# Patient Record
Sex: Female | Born: 1935
Health system: Southern US, Community
[De-identification: ages and names within clinical notes are randomized; demographics above are authoritative.]

## PROBLEM LIST (undated history)

## (undated) DIAGNOSIS — I2510 Atherosclerotic heart disease of native coronary artery without angina pectoris: Secondary | ICD-10-CM

## (undated) DIAGNOSIS — K449 Diaphragmatic hernia without obstruction or gangrene: Secondary | ICD-10-CM

## (undated) DIAGNOSIS — C801 Malignant (primary) neoplasm, unspecified: Secondary | ICD-10-CM

## (undated) DIAGNOSIS — M1712 Unilateral primary osteoarthritis, left knee: Secondary | ICD-10-CM

## (undated) DIAGNOSIS — R42 Dizziness and giddiness: Secondary | ICD-10-CM

## (undated) DIAGNOSIS — H9190 Unspecified hearing loss, unspecified ear: Secondary | ICD-10-CM

## (undated) DIAGNOSIS — R011 Cardiac murmur, unspecified: Secondary | ICD-10-CM

## (undated) DIAGNOSIS — I10 Essential (primary) hypertension: Secondary | ICD-10-CM

## (undated) DIAGNOSIS — Z9289 Personal history of other medical treatment: Secondary | ICD-10-CM

## (undated) DIAGNOSIS — E785 Hyperlipidemia, unspecified: Secondary | ICD-10-CM

## (undated) DIAGNOSIS — Z9889 Other specified postprocedural states: Secondary | ICD-10-CM

## (undated) DIAGNOSIS — T148XXA Other injury of unspecified body region, initial encounter: Secondary | ICD-10-CM

## (undated) DIAGNOSIS — I219 Acute myocardial infarction, unspecified: Secondary | ICD-10-CM

## (undated) DIAGNOSIS — B029 Zoster without complications: Secondary | ICD-10-CM

## (undated) DIAGNOSIS — M1990 Unspecified osteoarthritis, unspecified site: Secondary | ICD-10-CM

## (undated) DIAGNOSIS — R112 Nausea with vomiting, unspecified: Secondary | ICD-10-CM

## (undated) DIAGNOSIS — M109 Gout, unspecified: Secondary | ICD-10-CM

## (undated) DIAGNOSIS — M810 Age-related osteoporosis without current pathological fracture: Secondary | ICD-10-CM

## (undated) HISTORY — DX: Unspecified osteoarthritis, unspecified site: M19.90

## (undated) HISTORY — DX: Unilateral primary osteoarthritis, left knee: M17.12

## (undated) HISTORY — DX: Essential (primary) hypertension: I10

## (undated) HISTORY — DX: Diaphragmatic hernia without obstruction or gangrene: K44.9

## (undated) HISTORY — DX: Age-related osteoporosis without current pathological fracture: M81.0

## (undated) HISTORY — DX: Gout, unspecified: M10.9

## (undated) HISTORY — DX: Malignant (primary) neoplasm, unspecified: C80.1

## (undated) HISTORY — DX: Hyperlipidemia, unspecified: E78.5

## (undated) HISTORY — DX: Other injury of unspecified body region, initial encounter: T14.8XXA

## (undated) HISTORY — PX: TOTAL HIP ARTHROPLASTY: SHX124

## (undated) HISTORY — PX: SKIN GRAFT: SHX250

## (undated) HISTORY — DX: Zoster without complications: B02.9

## (undated) HISTORY — DX: Atherosclerotic heart disease of native coronary artery without angina pectoris: I25.10

---

## 1991-05-05 HISTORY — PX: CORONARY ANGIOPLASTY: SHX604

## 1992-05-04 HISTORY — PX: CORONARY ARTERY BYPASS GRAFT: SHX141

## 1996-05-04 HISTORY — PX: JOINT REPLACEMENT: SHX530

## 1999-06-17 ENCOUNTER — Other Ambulatory Visit: Admission: RE | Admit: 1999-06-17 | Discharge: 1999-06-17 | Payer: Self-pay | Admitting: Obstetrics and Gynecology

## 2001-03-09 ENCOUNTER — Encounter: Payer: Self-pay | Admitting: Cardiovascular Disease

## 2001-03-09 HISTORY — PX: CORONARY ANGIOPLASTY WITH STENT PLACEMENT: SHX49

## 2001-03-10 ENCOUNTER — Inpatient Hospital Stay (HOSPITAL_COMMUNITY): Admission: RE | Admit: 2001-03-10 | Discharge: 2001-03-21 | Payer: Self-pay | Admitting: Cardiovascular Disease

## 2002-07-27 ENCOUNTER — Other Ambulatory Visit: Admission: RE | Admit: 2002-07-27 | Discharge: 2002-07-27 | Payer: Self-pay | Admitting: Obstetrics and Gynecology

## 2003-05-10 ENCOUNTER — Encounter: Payer: Self-pay | Admitting: Internal Medicine

## 2003-12-25 ENCOUNTER — Other Ambulatory Visit: Admission: RE | Admit: 2003-12-25 | Discharge: 2003-12-25 | Payer: Self-pay | Admitting: Obstetrics and Gynecology

## 2004-03-11 ENCOUNTER — Ambulatory Visit: Payer: Self-pay | Admitting: Internal Medicine

## 2004-09-10 ENCOUNTER — Ambulatory Visit: Payer: Self-pay | Admitting: Internal Medicine

## 2004-09-22 ENCOUNTER — Ambulatory Visit: Payer: Self-pay | Admitting: Internal Medicine

## 2004-09-23 ENCOUNTER — Ambulatory Visit: Payer: Self-pay | Admitting: Internal Medicine

## 2004-09-25 ENCOUNTER — Ambulatory Visit (HOSPITAL_COMMUNITY): Admission: RE | Admit: 2004-09-25 | Discharge: 2004-09-25 | Payer: Self-pay | Admitting: Internal Medicine

## 2004-10-07 ENCOUNTER — Ambulatory Visit: Payer: Self-pay | Admitting: Internal Medicine

## 2004-10-23 ENCOUNTER — Ambulatory Visit: Payer: Self-pay | Admitting: Internal Medicine

## 2004-12-18 ENCOUNTER — Ambulatory Visit: Payer: Self-pay | Admitting: Internal Medicine

## 2005-02-03 ENCOUNTER — Ambulatory Visit: Payer: Self-pay | Admitting: Internal Medicine

## 2005-02-06 ENCOUNTER — Ambulatory Visit: Payer: Self-pay | Admitting: Internal Medicine

## 2005-03-09 ENCOUNTER — Ambulatory Visit: Payer: Self-pay | Admitting: Internal Medicine

## 2005-03-19 ENCOUNTER — Ambulatory Visit: Payer: Self-pay | Admitting: Internal Medicine

## 2005-09-10 ENCOUNTER — Ambulatory Visit: Payer: Self-pay | Admitting: Internal Medicine

## 2005-09-25 ENCOUNTER — Ambulatory Visit: Payer: Self-pay | Admitting: Internal Medicine

## 2005-10-13 ENCOUNTER — Ambulatory Visit: Payer: Self-pay | Admitting: Internal Medicine

## 2005-10-30 ENCOUNTER — Other Ambulatory Visit: Admission: RE | Admit: 2005-10-30 | Discharge: 2005-10-30 | Payer: Self-pay | Admitting: Obstetrics and Gynecology

## 2006-02-12 ENCOUNTER — Ambulatory Visit: Payer: Self-pay | Admitting: Internal Medicine

## 2006-03-10 ENCOUNTER — Ambulatory Visit: Payer: Self-pay | Admitting: Internal Medicine

## 2006-05-30 ENCOUNTER — Emergency Department (HOSPITAL_COMMUNITY): Admission: EM | Admit: 2006-05-30 | Discharge: 2006-05-30 | Payer: Self-pay | Admitting: Emergency Medicine

## 2006-05-31 ENCOUNTER — Ambulatory Visit: Payer: Self-pay | Admitting: Internal Medicine

## 2006-05-31 ENCOUNTER — Encounter: Admission: RE | Admit: 2006-05-31 | Discharge: 2006-05-31 | Payer: Self-pay | Admitting: Internal Medicine

## 2006-05-31 LAB — CONVERTED CEMR LAB
Basophils Absolute: 0 10*3/uL (ref 0.0–0.1)
Basophils Relative: 0.2 % (ref 0.0–1.0)
Eosinophils Absolute: 0.1 10*3/uL (ref 0.0–0.6)
Eosinophils Relative: 0.7 % (ref 0.0–5.0)
HCT: 37.6 % (ref 36.0–46.0)
Hemoglobin: 12.9 g/dL (ref 12.0–15.0)
Lymphocytes Relative: 15.9 % (ref 12.0–46.0)
MCHC: 34.3 g/dL (ref 30.0–36.0)
MCV: 84.2 fL (ref 78.0–100.0)
Monocytes Absolute: 0.9 10*3/uL — ABNORMAL HIGH (ref 0.2–0.7)
Monocytes Relative: 7.4 % (ref 3.0–11.0)
Neutro Abs: 8.7 10*3/uL — ABNORMAL HIGH (ref 1.4–7.7)
Neutrophils Relative %: 75.8 % (ref 43.0–77.0)
Platelets: 272 10*3/uL (ref 150–400)
RBC: 4.47 M/uL (ref 3.87–5.11)
RDW: 14.4 % (ref 11.5–14.6)
Rheumatoid fact SerPl-aCnc: <20 intl units/mL — ABNORMAL LOW (ref 0.0–20.0)
Sed Rate: 82 mm/hr — ABNORMAL HIGH (ref 0–25)
WBC: 11.5 10*3/uL — ABNORMAL HIGH (ref 4.5–10.5)

## 2006-06-21 ENCOUNTER — Ambulatory Visit: Payer: Self-pay | Admitting: Internal Medicine

## 2006-06-21 LAB — CONVERTED CEMR LAB
Hgb A1c MFr Bld: 6.7 % — ABNORMAL HIGH (ref 4.6–6.0)
Sed Rate: 16 mm/hr (ref 0–25)

## 2006-08-02 ENCOUNTER — Ambulatory Visit: Payer: Self-pay | Admitting: Internal Medicine

## 2006-08-02 LAB — CONVERTED CEMR LAB
BUN: 20 mg/dL (ref 6–23)
Creatinine, Ser: 1 mg/dL (ref 0.4–1.2)
Hgb A1c MFr Bld: 6.6 % — ABNORMAL HIGH (ref 4.6–6.0)
Potassium: 3.3 meq/L — ABNORMAL LOW (ref 3.5–5.1)

## 2006-08-13 ENCOUNTER — Ambulatory Visit: Payer: Self-pay | Admitting: Internal Medicine

## 2006-09-08 DIAGNOSIS — Z951 Presence of aortocoronary bypass graft: Secondary | ICD-10-CM | POA: Insufficient documentation

## 2006-09-08 DIAGNOSIS — R748 Abnormal levels of other serum enzymes: Secondary | ICD-10-CM | POA: Insufficient documentation

## 2006-09-08 DIAGNOSIS — E785 Hyperlipidemia, unspecified: Secondary | ICD-10-CM | POA: Insufficient documentation

## 2006-09-08 DIAGNOSIS — M353 Polymyalgia rheumatica: Secondary | ICD-10-CM | POA: Insufficient documentation

## 2006-09-08 DIAGNOSIS — Z9889 Other specified postprocedural states: Secondary | ICD-10-CM

## 2006-09-09 ENCOUNTER — Ambulatory Visit: Payer: Self-pay | Admitting: Internal Medicine

## 2006-09-09 ENCOUNTER — Encounter: Payer: Self-pay | Admitting: Internal Medicine

## 2006-11-30 ENCOUNTER — Ambulatory Visit: Payer: Self-pay | Admitting: Internal Medicine

## 2006-11-30 LAB — CONVERTED CEMR LAB
BUN: 21 mg/dL (ref 6–23)
Creatinine, Ser: 1.2 mg/dL (ref 0.4–1.2)
Creatinine,U: 139.4 mg/dL
Hgb A1c MFr Bld: 6.4 % — ABNORMAL HIGH (ref 4.6–6.0)
Microalb Creat Ratio: 11.5 mg/g (ref 0.0–30.0)
Microalb, Ur: 1.6 mg/dL (ref 0.0–1.9)
Potassium: 3.6 meq/L (ref 3.5–5.1)

## 2006-12-07 ENCOUNTER — Ambulatory Visit: Payer: Self-pay | Admitting: Internal Medicine

## 2007-01-04 ENCOUNTER — Telehealth (INDEPENDENT_AMBULATORY_CARE_PROVIDER_SITE_OTHER): Payer: Self-pay | Admitting: *Deleted

## 2007-01-06 ENCOUNTER — Ambulatory Visit: Payer: Self-pay | Admitting: Internal Medicine

## 2007-01-06 DIAGNOSIS — T887XXA Unspecified adverse effect of drug or medicament, initial encounter: Secondary | ICD-10-CM | POA: Insufficient documentation

## 2007-01-12 ENCOUNTER — Encounter: Payer: Self-pay | Admitting: Internal Medicine

## 2007-02-22 ENCOUNTER — Telehealth: Payer: Self-pay | Admitting: Internal Medicine

## 2007-02-23 ENCOUNTER — Encounter: Payer: Self-pay | Admitting: Internal Medicine

## 2007-03-08 ENCOUNTER — Ambulatory Visit: Payer: Self-pay | Admitting: Internal Medicine

## 2007-03-11 ENCOUNTER — Encounter: Payer: Self-pay | Admitting: Internal Medicine

## 2007-03-18 ENCOUNTER — Encounter: Payer: Self-pay | Admitting: Internal Medicine

## 2007-05-11 ENCOUNTER — Ambulatory Visit: Payer: Self-pay | Admitting: Internal Medicine

## 2007-05-21 LAB — CONVERTED CEMR LAB: Hgb A1c MFr Bld: 6.1 % — ABNORMAL HIGH (ref 4.6–6.0)

## 2007-05-23 ENCOUNTER — Encounter (INDEPENDENT_AMBULATORY_CARE_PROVIDER_SITE_OTHER): Payer: Self-pay | Admitting: *Deleted

## 2007-09-29 ENCOUNTER — Ambulatory Visit: Payer: Self-pay | Admitting: Internal Medicine

## 2007-09-29 LAB — CONVERTED CEMR LAB
Cholesterol, target level: 200 mg/dL
HDL goal, serum: 40 mg/dL
LDL Goal: 70 mg/dL

## 2007-10-08 ENCOUNTER — Encounter: Payer: Self-pay | Admitting: Internal Medicine

## 2007-11-15 ENCOUNTER — Ambulatory Visit: Payer: Self-pay | Admitting: Internal Medicine

## 2007-11-17 ENCOUNTER — Encounter: Payer: Self-pay | Admitting: Internal Medicine

## 2007-11-24 ENCOUNTER — Encounter: Payer: Self-pay | Admitting: Internal Medicine

## 2007-11-28 LAB — CONVERTED CEMR LAB: Hgb A1c MFr Bld: 6.2 % — ABNORMAL HIGH (ref 4.6–6.0)

## 2007-12-13 ENCOUNTER — Ambulatory Visit: Payer: Self-pay | Admitting: Internal Medicine

## 2007-12-16 ENCOUNTER — Encounter: Payer: Self-pay | Admitting: Internal Medicine

## 2007-12-18 LAB — CONVERTED CEMR LAB
Basophils Relative: 0.2 % (ref 0.0–3.0)
Eosinophils Absolute: 0.1 10*3/uL (ref 0.0–0.7)
Eosinophils Relative: 2 % (ref 0.0–5.0)
HCT: 33.7 % — ABNORMAL LOW (ref 36.0–46.0)
Hemoglobin: 11.3 g/dL — ABNORMAL LOW (ref 12.0–15.0)
MCV: 88.7 fL (ref 78.0–100.0)
Neutro Abs: 5 10*3/uL (ref 1.4–7.7)
Neutrophils Relative %: 67.7 % (ref 43.0–77.0)
RBC: 3.8 M/uL — ABNORMAL LOW (ref 3.87–5.11)
WBC: 7.3 10*3/uL (ref 4.5–10.5)

## 2007-12-19 ENCOUNTER — Encounter (INDEPENDENT_AMBULATORY_CARE_PROVIDER_SITE_OTHER): Payer: Self-pay | Admitting: *Deleted

## 2007-12-21 ENCOUNTER — Ambulatory Visit: Payer: Self-pay | Admitting: Internal Medicine

## 2007-12-21 DIAGNOSIS — R195 Other fecal abnormalities: Secondary | ICD-10-CM

## 2007-12-21 DIAGNOSIS — E119 Type 2 diabetes mellitus without complications: Secondary | ICD-10-CM | POA: Insufficient documentation

## 2007-12-21 DIAGNOSIS — D649 Anemia, unspecified: Secondary | ICD-10-CM | POA: Insufficient documentation

## 2007-12-21 LAB — CONVERTED CEMR LAB
Albumin, U: DETECTED %
Alpha 1, Urine: DETECTED % — AB
Gamma Globulin, Urine: DETECTED % — AB
Volume, Urine: 2800 mL

## 2008-01-05 ENCOUNTER — Ambulatory Visit: Payer: Self-pay | Admitting: Internal Medicine

## 2008-01-05 DIAGNOSIS — R809 Proteinuria, unspecified: Secondary | ICD-10-CM

## 2008-01-06 LAB — CONVERTED CEMR LAB
Basophils Relative: 1.4 % (ref 0.0–3.0)
Eosinophils Absolute: 0.2 10*3/uL (ref 0.0–0.7)
Eosinophils Relative: 1.7 % (ref 0.0–5.0)
HCT: 34.5 % — ABNORMAL LOW (ref 36.0–46.0)
MCV: 87 fL (ref 78.0–100.0)
Monocytes Absolute: 0.8 10*3/uL (ref 0.1–1.0)
RBC: 3.96 M/uL (ref 3.87–5.11)
WBC: 10.1 10*3/uL (ref 4.5–10.5)

## 2008-01-12 ENCOUNTER — Ambulatory Visit: Payer: Self-pay | Admitting: Internal Medicine

## 2008-01-12 ENCOUNTER — Encounter: Payer: Self-pay | Admitting: Internal Medicine

## 2008-01-12 LAB — HM COLONOSCOPY

## 2008-01-24 LAB — CONVERTED CEMR LAB: Folate: >20 ng/mL

## 2008-02-15 ENCOUNTER — Encounter (INDEPENDENT_AMBULATORY_CARE_PROVIDER_SITE_OTHER): Payer: Self-pay | Admitting: *Deleted

## 2008-03-14 ENCOUNTER — Encounter: Payer: Self-pay | Admitting: Internal Medicine

## 2008-03-28 ENCOUNTER — Encounter: Payer: Self-pay | Admitting: Internal Medicine

## 2008-04-12 ENCOUNTER — Telehealth: Payer: Self-pay | Admitting: Internal Medicine

## 2008-07-05 ENCOUNTER — Ambulatory Visit: Payer: Self-pay | Admitting: Internal Medicine

## 2008-07-16 ENCOUNTER — Encounter (INDEPENDENT_AMBULATORY_CARE_PROVIDER_SITE_OTHER): Payer: Self-pay | Admitting: *Deleted

## 2008-07-16 ENCOUNTER — Ambulatory Visit: Payer: Self-pay | Admitting: Internal Medicine

## 2008-07-16 LAB — CONVERTED CEMR LAB
ALT: 13 units/L (ref 0–35)
Cholesterol: 146 mg/dL (ref 0–200)
HDL: 55.1 mg/dL (ref >39.0)
Total Protein: 7.6 g/dL (ref 6.0–8.3)
VLDL: 15 mg/dL (ref 0–40)

## 2008-09-17 ENCOUNTER — Encounter: Payer: Self-pay | Admitting: Internal Medicine

## 2009-02-15 ENCOUNTER — Ambulatory Visit: Payer: Self-pay | Admitting: Internal Medicine

## 2009-03-16 ENCOUNTER — Emergency Department (HOSPITAL_COMMUNITY): Admission: EM | Admit: 2009-03-16 | Discharge: 2009-03-16 | Payer: Self-pay | Admitting: Emergency Medicine

## 2009-03-19 ENCOUNTER — Ambulatory Visit: Payer: Self-pay | Admitting: Internal Medicine

## 2009-03-20 LAB — CONVERTED CEMR LAB
Hgb A1c MFr Bld: 6.1 % (ref 4.6–6.5)
Microalb, Ur: 0.2 mg/dL (ref 0.0–1.9)

## 2009-04-11 ENCOUNTER — Encounter: Payer: Self-pay | Admitting: Internal Medicine

## 2009-05-07 ENCOUNTER — Telehealth (INDEPENDENT_AMBULATORY_CARE_PROVIDER_SITE_OTHER): Payer: Self-pay | Admitting: *Deleted

## 2009-07-30 ENCOUNTER — Encounter: Payer: Self-pay | Admitting: Internal Medicine

## 2009-12-31 ENCOUNTER — Ambulatory Visit: Payer: Self-pay | Admitting: Internal Medicine

## 2009-12-31 DIAGNOSIS — M810 Age-related osteoporosis without current pathological fracture: Secondary | ICD-10-CM

## 2009-12-31 DIAGNOSIS — I10 Essential (primary) hypertension: Secondary | ICD-10-CM | POA: Insufficient documentation

## 2009-12-31 DIAGNOSIS — Z85828 Personal history of other malignant neoplasm of skin: Secondary | ICD-10-CM | POA: Insufficient documentation

## 2010-01-07 ENCOUNTER — Ambulatory Visit: Payer: Self-pay | Admitting: Internal Medicine

## 2010-01-07 LAB — CONVERTED CEMR LAB
ALT: 15 units/L (ref 0–35)
AST: 20 units/L (ref 0–37)
Alkaline Phosphatase: 93 units/L (ref 39–117)
Basophils Relative: 0.9 % (ref 0.0–3.0)
Bilirubin, Direct: 0.2 mg/dL (ref 0.0–0.3)
Chloride: 106 meq/L (ref 96–112)
Creatinine, Ser: 1.2 mg/dL (ref 0.4–1.2)
Eosinophils Relative: 1.6 % (ref 0.0–5.0)
LDL Cholesterol: 86 mg/dL (ref 0–99)
MCV: 90.3 fL (ref 78.0–100.0)
Monocytes Absolute: 0.6 10*3/uL (ref 0.1–1.0)
Monocytes Relative: 7.9 % (ref 3.0–12.0)
Neutrophils Relative %: 68 % (ref 43.0–77.0)
Potassium: 4.1 meq/L (ref 3.5–5.1)
RBC: 4.1 M/uL (ref 3.87–5.11)
Total CHOL/HDL Ratio: 3
Total Protein: 7.7 g/dL (ref 6.0–8.3)
Triglycerides: 101 mg/dL (ref 0.0–149.0)
Vit D, 25-Hydroxy: 46 ng/mL (ref 30–89)
WBC: 7.4 10*3/uL (ref 4.5–10.5)

## 2010-01-23 ENCOUNTER — Encounter: Payer: Self-pay | Admitting: Internal Medicine

## 2010-03-11 ENCOUNTER — Encounter: Payer: Self-pay | Admitting: Internal Medicine

## 2010-06-03 NOTE — Assessment & Plan Note (Signed)
Summary: med refill/fasting/kn   Vital Signs:  Patient profile:   77 year old female Height:      66.25 inches Weight:      155 pounds BMI:     24.92 Temp:     98.2 degrees F oral Pulse rate:   64 / minute Resp:     16 per minute BP sitting:   130 / 72  (left arm) Cuff size:   large  Vitals Entered By: Shonna Chock CMA (December 31, 2009 11:07 AM) CC: CPX/fasting labs, patient with heart work-up by Cardiologist 6 months ago, patient with pending appointment with Cardio on 01/23/2010, Type 2 diabetes mellitus follow-up Comments Patient aware to return for TDaP and Pneumonia vaccine   Primary Care Provider:  Marga Melnick, MD  CC:  CPX/fasting labs, patient with heart work-up by Cardiologist 6 months ago, patient with pending appointment with Cardio on 01/23/2010, and Type 2 diabetes mellitus follow-up.  History of Present Illness: Here for Medicare AWV: 1.Risk factors based on Past M, S, F history:HTN; Osteopenia;Fasting Hyperglycemia ( A1c was 6.7% in 2008); CAD( Dr Allyson Sabal sen every 6 months) 2.Physical Activities: none 3.Depression/mood: no issue 4.Hearing: slight ly decreased hearing to whisper  @ 6 ft 5.ADL's: no limitations 6.Fall Risk: denied 7.Home Safety: denied  8.Height, weight, &visual acuity:wall chart read @ 6 ft 9.Counseling: none requested ; POA /Living Will not in place 10.Labs ordered based on risk factors: see Orders 11.Referral Coordination: none requested 12. Care Plan: see Instructions 13. Cognitive Assessment : Oriented X 3 ; " WORLD" spelled backwards; recall & memory normal; affect & mood normal. Type 2 Diabetes Mellitus Follow-Up      This is a 77 year old woman who presents for Type 2 diabetes mellitus follow-up. She is on no meds for DM. The patient denies polyuria, polydipsia, blurred vision, self managed hypoglycemia, weight loss, weight gain, and numbness of extremities.  The patient denies the following symptoms: neuropathic pain, chest pain,  vomiting, orthostatic symptoms, poor wound healing, intermittent claudication, vision loss, and foot ulcer.  Since the last visit the patient reports good dietary compliance.  The patient has been measuring capillary blood glucose before breakfast ( range 106-130)and after dinner ( usually < 150).  Since the last visit, the patient reports having had eye care by an ophthalmologist but  no foot care.  Hypertension Follow-Up      The patient also presents for Hypertension follow-up.  The patient denies headaches and fatigue.  Compliance with medications (by patient report) has been near 100%.  Adjunctive measures currently used by the patient include salt restriction.   BP averages130-140/60-70.  Preventive Screening-Counseling & Management  Alcohol-Tobacco     Alcohol drinks/day: 0     Smoking Status: never  Hep-HIV-STD-Contraception     Dental Visit-last 6 months yes     Sun Exposure-Excessive: no  Safety-Violence-Falls     Seat Belt Use: yes     Firearms in the Home: no firearms in the home     Smoke Detectors: yes      Blood Transfusions:  yes and @ [facility].        Travel History:  no foreign travel.    Current Medications (verified): 1)  Diovan 320 Mg  Tabs (Valsartan) .Marland Kitchen.. 1 Daily 2)  Atenolol 50 Mg Tabs (Atenolol) .Marland Kitchen.. 1 By Mouth Bid 3)  Baby Aspirin 81 Mg  Chew (Aspirin) .Marland Kitchen.. 1 Once Daily 4)  Norvasc 10 Mg  Tabs (Amlodipine Besylate) .Marland Kitchen.. 1 By Mouth  Qd 5)  Multivitamin/iron   Tabs (Multiple Vitamins-Iron) .Marland Kitchen.. 1 Once Daily 6)  Tylenol Arthritis Pain 650 Mg  Tbcr (Acetaminophen) .... As Needed 7)  Triamterene-Hctz 75-50 Mg Tabs (Triamterene-Hctz) .... 1/2 By Mouth Once Daily 8)  Caltrate .Marland Kitchen.. 1 By Mouth Two Times A Day 9)  Onetouch Ultra Test  Strp (Glucose Blood) .... Checks Bs Two Times A Day Dx 250.00 10)  Fosamax 70 Mg Tabs (Alendronate Sodium) .Marland Kitchen.. 1 By Mouth Weekly 11)  Vitamin B-12 1000 Mcg Tabs (Cyanocobalamin) .... Take 1 Tablet By Mouth Once A Day 12)  Simvastatin 40 Mg  Tabs (Simvastatin) .Marland Kitchen.. 1 By Mouth At Bedtime, Labs Due 13)  Onetouch Lancets  Misc (Lancets) .... Checks Bs Two Times A Day Dx 250.00 14)  Isosorbide Mononitrate Cr 30 Mg Xr24h-Tab (Isosorbide Mononitrate) .... 1/2 By Mouth Once Daily  Allergies (verified): No Known Drug Allergies  Past History:  Past Medical History: Hyperlipidemia Diabetes mellitus, type II (A1c was 6.7% in 2008 while on steroids for PMR) Hemorrhoids Hiatal Hernia Osteoporosis Ostearthritis PMR 2008 Skin cancer, hx of, 2011 , Dr Joseph Art Hypertension  Past Surgical History: Colonoscopy 2006: negative ;Endoscopy  : erosive  gastritis (on Actonel & NSAIDS) CABG 1994 PTCA-Stent 2002 Hip Replacement-right 1998  Family History: Family History of Heart Disease: Father, 3 brothers Family History of Breast Cancer: Sister; FH DM :2 sisters  No FH of Colon Cancer:  Social History: Married, 1boy, 1 girl Retired Alcohol Use - no Illicit Drug Use - no Patient has never smoked.  Daily Caffeine Use occasionally Patient does not get regular exercise.  Dental Care w/in 6 mos.:  yes Sun Exposure-Excessive:  no Seat Belt Use:  yes Blood Transfusions:  yes, @ [facility]  Review of Systems  The patient denies anorexia, fever, hoarseness, prolonged cough, abdominal pain, melena, hematochezia, severe indigestion/heartburn, hematuria, incontinence, unusual weight change, abnormal bleeding, enlarged lymph nodes, and angioedema.    Physical Exam  General:  Appeears younger than age;alert,appropriate and cooperative throughout examination Head:  Normocephalic and atraumatic without obvious abnormalities.  Eyes:  No corneal or conjunctival inflammation noted.Perrla. Funduscopic exam benign, without hemorrhages, exudates or papilledema. Vision grossly normal with lenses.Marked ptosius OS. Ears:  External ear exam shows no significant lesions or deformities.  Otoscopic examination reveals clear canals, tympanic membranes are intact  bilaterally without bulging, retraction, inflammation or discharge. Hearing is grossly normal bilaterally. Nose:  External nasal examination shows no deformity or inflammation. Nasal mucosa are pink and moist without lesions or exudates. Mouth:  Oral mucosa and oropharynx without lesions or exudates.  Teeth in good repair. Upper & lower partials. Neck:  No deformities, masses, or tenderness noted. Lungs:  Normal respiratory effort, chest expands symmetrically. Lungs are clear to auscultation, no crackles or wheezes. Heart:  normal rate, regular rhythm, no gallop, no rub, no JVD, no HJR, and grade  1/6 systolic murmur loudest R base.   Abdomen:  Bowel sounds positive,abdomen soft and non-tender without masses, organomegaly or hernias noted. Genitalia:  Dr Jackelyn Knife Msk:  No deformity or scoliosis noted of thoracic or lumbar spine.   Pulses:  R and L carotid,radial,dorsalis pedis and posterior tibial pulses are full and equal bilaterally Extremities:  No clubbing, cyanosis, edema. Flexion contracture 5th R finger.Normal full range of motion of all joints otherwise. Good nail health.   Neurologic:  alert & oriented X3 and sensation intact to light touch over  feet.   Skin:  Intact without suspicious lesions or rashes Cervical Nodes:  No  lymphadenopathy noted Axillary Nodes:  No palpable lymphadenopathy Psych:  memory intact for recent and remote, normally interactive, and good eye contact.     Impression & Recommendations:  Problem # 1:  PREVENTIVE HEALTH CARE (ICD-V70.0)  Orders: [facility] -Subsequent Annual Wellness Visit [phone])  Problem # 2:  HYPERLIPIDEMIA (ICD-272.4)  Her updated medication list for this problem includes:    Simvastatin 40 Mg Tabs (Simvastatin) .Marland Kitchen... 1 by mouth at bedtime, labs due  Orders: Venipuncture (93818) TLB-Lipid Panel (80061-LIPID) TLB-Hepatic/Liver Function Pnl (80076-HEPATIC) TLB-TSH (Thyroid Stimulating Hormone) (84443-TSH)  Problem # 3:  DIABETES  MELLITUS, TYPE II (ICD-250.00)  Steroid induced The following medications were removed from the medication list:    Diovan 320 Mg Tabs (Valsartan) .Marland Kitchen... 1 daily Her updated medication list for this problem includes:    Baby Aspirin 81 Mg Chew (Aspirin) .Marland Kitchen... 1 once daily    Losartan Potassium 100 Mg Tabs (Losartan potassium) .Marland Kitchen... 1 once daily in place of diovan  Orders: Venipuncture (29937) TLB-A1C / Hgb A1C (Glycohemoglobin) (83036-A1C) TLB-Microalbumin/Creat Ratio, Urine (82043-MALB)  Problem # 4:  HYPERTENSION (ICD-401.9)  The following medications were removed from the medication list:    Diovan 320 Mg Tabs (Valsartan) .Marland Kitchen... 1 daily Her updated medication list for this problem includes:    Atenolol 50 Mg Tabs (Atenolol) .Marland Kitchen... 1 by mouth bid    Norvasc 10 Mg Tabs (Amlodipine besylate) .Marland Kitchen... 1 by mouth qd    Triamterene-hctz 75-50 Mg Tabs (Triamterene-hctz) .Marland Kitchen... 1/2 by mouth once daily    Losartan Potassium 100 Mg Tabs (Losartan potassium) .Marland Kitchen... 1 once daily in place of diovan  Orders: Venipuncture (16967) TLB-BMP (Basic Metabolic Panel-BMET) (80048-METABOL)  Problem # 5:  UNSPECIFIED ANEMIA (ICD-285.9) PMH of Her updated medication list for this problem includes:    Vitamin B-12 1000 Mcg Tabs (Cyanocobalamin) .Marland Kitchen... Take 1 tablet by mouth once a day  Orders: Venipuncture (89381) TLB-CBC Platelet - w/Differential (85025-CBCD)  Problem # 6:  OSTEOPOROSIS (ICD-733.00)  Her updated medication list for this problem includes:    Fosamax 70 Mg Tabs (Alendronate sodium) .Marland Kitchen... 1 by mouth weekly  Orders: T-Vitamin D (25-Hydroxy) (01751-02585)  Complete Medication List: 1)  Atenolol 50 Mg Tabs (Atenolol) .Marland Kitchen.. 1 by mouth bid 2)  Baby Aspirin 81 Mg Chew (Aspirin) .Marland Kitchen.. 1 once daily 3)  Norvasc 10 Mg Tabs (Amlodipine besylate) .Marland Kitchen.. 1 by mouth qd 4)  Multivitamin/iron Tabs (Multiple vitamins-iron) .Marland Kitchen.. 1 once daily 5)  Tylenol Arthritis Pain 650 Mg Tbcr (Acetaminophen) .... As  needed 6)  Triamterene-hctz 75-50 Mg Tabs (Triamterene-hctz) .... 1/2 by mouth once daily 7)  Caltrate  .Marland KitchenMarland Kitchen. 1 by mouth two times a day 8)  Onetouch Ultra Test Strp (Glucose blood) .... Checks bs two times a day dx 250.00 9)  Fosamax 70 Mg Tabs (Alendronate sodium) .Marland Kitchen.. 1 by mouth weekly 10)  Vitamin B-12 1000 Mcg Tabs (Cyanocobalamin) .... Take 1 tablet by mouth once a day 11)  Simvastatin 40 Mg Tabs (Simvastatin) .Marland Kitchen.. 1 by mouth at bedtime, labs due 12)  Onetouch Lancets Misc (Lancets) .... Checks bs two times a day dx 250.00 13)  Isosorbide Mononitrate Cr 30 Mg Xr24h-tab (Isosorbide mononitrate) .... 1/2 by mouth once daily 14)  Losartan Potassium 100 Mg Tabs (Losartan potassium) .Marland Kitchen.. 1 once daily in place of diovan  Other Orders: Flu Vaccine 31yrs + (27782) Administration Flu vaccine - MCR (U2353) Flu Vaccine Consent Questions     Do you have a history of severe allergic reactions to this vaccine? no  Any prior history of allergic reactions to egg and/or gelatin? no    Do you have a sensitivity to the preservative Thimersol? no    Do you have a past history of Guillan-Barre Syndrome? no    Do you currently have an acute febrile illness? no    Have you ever had a severe reaction to latex? no    Vaccine information given and explained to patient? yes    Are you currently pregnant? no    Lot Number:[id number]   Exp Date:11/01/2010   Site Given  Right Deltoid IMation Flu vaccine - MCR (Z6109)  Patient Instructions: 1)  Take an  81 mg coated Aspirin every day. 2)  Choose your Health care Power of Attorney and/or prepare a Living Will. 3)  See your eye doctor yearly to check for diabetic eye damage. 4)  Check your feet each night for sore areas, calluses or signs of infection. Prescriptions: LOSARTAN POTASSIUM 100 MG TABS (LOSARTAN POTASSIUM) 1 once daily in place of Diovan  #90 x 3   Entered and Authorized by:   Marga Melnick MD   Signed by:   Marga Melnick MD on 12/31/2009    Method used:   Print then Give to Patient   RxID:   [id number] SIMVASTATIN 40 MG TABS (SIMVASTATIN) 1 by mouth at bedtime, LABS DUE  #90 x 3   Entered and Authorized by:   Marga Melnick MD   Signed by:   Marga Melnick MD on 12/31/2009   Method used:   Print then Give to Patient   RxID:   [id number] TRIAMTERENE-HCTZ 75-50 MG TABS (TRIAMTERENE-HCTZ) 1/2 by mouth once daily  #90 x 1   Entered and Authorized by:   Marga Melnick MD   Signed by:   Marga Melnick MD on 12/31/2009   Method used:   Print then Give to Patient   RxID:   [id number] ATENOLOL 50 MG TABS (ATENOLOL) 1 by mouth bid  #180 Tablet x 3   Entered and Authorized by:   Marga Melnick MD   Signed by:   Marga Melnick MD on 12/31/2009   Method used:   Print then Give to Patient   RxID:   [id number]    .lbmedflu

## 2010-06-03 NOTE — Letter (Signed)
Summary: [facility]  [facility]   Imported By: Lanelle Bal 02/03/2010 12:14:07  _____________________________________________________________________  External Attachment:    Type:   Image     Comment:   External Document

## 2010-06-03 NOTE — Assessment & Plan Note (Signed)
Summary: TD VACCINE///SPH   Nurse Visit   Allergies: No Known Drug Allergies  Immunizations Administered:  Tetanus Vaccine:    Vaccine Type: Tdap    Site: right deltoid    Mfr: GlaxoSmithKline    Dose: 0.5 ml    Route: IM    Given by: Shonna Chock CMA    Exp. Date: 01/23/2012    Lot #: [id number]    VIS given: 03/21/08 version given January 07, 2010.  Orders Added: 1)  Tdap => 37yrs IM [90715] 2)  Admin 1st Vaccine [93235]

## 2010-06-03 NOTE — Progress Notes (Signed)
Summary: refill  Phone Note Refill Request Message from:  Fax from Pharmacy on [facility]  [address] fax [phone]  Refills Requested: Medication #1:  PRECISION XTRA BLOOD GLUCOSE   STRP checks BS two times a day dx 250.00 fax [phone] precision xtra test strips d/c (recalled) - Sheila Morgan may need new rx for different  machine strips lancets.  Initial call taken by: Barb Merino,  May 07, 2009 11:22 AM  Follow-up for Phone Call        Spoke with patient and she is ok with a new machine, patient aware I will put a new Glucose monitoring machine at the front with rx's for strips and lancets Follow-up by: Shonna Chock,  May 07, 2009 11:43 AM    New/Updated Medications: ONETOUCH ULTRA TEST  STRP (GLUCOSE BLOOD) checks BS two times a day dx 250.00 ONETOUCH LANCETS  MISC (LANCETS) checks BS two times a day dx 250.00 Prescriptions: ONETOUCH LANCETS  MISC (LANCETS) checks BS two times a day dx 250.00  #100 x 4   Entered by:   Shonna Chock   Authorized by:   Marga Melnick MD   Signed by:   Shonna Chock on 05/07/2009   Method used:   Print then Give to Patient   RxID:   [id number] [facility] ULTRA TEST  STRP (GLUCOSE BLOOD) checks BS two times a day dx 250.00  #100 x 4   Entered by:   Shonna Chock   Authorized by:   Marga Melnick MD   Signed by:   Shonna Chock on 05/07/2009   Method used:   Print then Give to Patient   RxID:   [id number]

## 2010-06-03 NOTE — Letter (Signed)
Summary: [facility]  [facility]   Imported By: Lanelle Bal 08/12/2009 14:14:21  _____________________________________________________________________  External Attachment:    Type:   Image     Comment:   External Document

## 2010-06-09 ENCOUNTER — Encounter: Payer: Self-pay | Admitting: Internal Medicine

## 2010-06-12 ENCOUNTER — Encounter: Payer: Self-pay | Admitting: Internal Medicine

## 2010-08-06 LAB — URINE CULTURE

## 2010-08-06 LAB — URINALYSIS, ROUTINE W REFLEX MICROSCOPIC
Bilirubin Urine: NEGATIVE
Nitrite: NEGATIVE
Specific Gravity, Urine: 1.016 (ref 1.005–1.030)
pH: 6 (ref 5.0–8.0)

## 2010-08-06 LAB — URINE MICROSCOPIC-ADD ON

## 2010-09-19 ENCOUNTER — Other Ambulatory Visit: Payer: Self-pay | Admitting: Internal Medicine

## 2010-09-19 NOTE — Assessment & Plan Note (Signed)
[facility]                        [facility] OFFICE NOTE   NAME:Keplinger, KAMILL FULBRIGHT                      MRN:          [id number]  DATE:05/31/2006                            DOB:          [birth year]    Haskel Khan was seen May 31, 2006, with pain in the left ear area.  She had been seen in the emergency room on January 27 and diagnosed with  torticollis.  She had been prescribed diazepam and was not better; she  stated it made me dizzy.   Her first symptoms had been stiffness in the neck bilaterally on January  24 which was progressive.  The ear pain began January 27.   Cranial nerve exam was unremarkable.  Field of vision and extraocular  motion was normal.  The neck and upper shoulder muscles were markedly  tight and spastic.  There was tenderness to palpation in these areas.  Degenerative joint disease was noted in the hands.  Her neuropsychiatric  exam was unremarkable except for slurred speech attributed to the  medications.   Her past history includes total knee replacement, history of bypass  grafting in 1994.  A stent was placed in 2002.  She has had erosive  gastritis related to nonsteroidal and Actonel in the past.  She has had  dyslipidemia and has had an isolated elevation of alkaline phosphatase  with normal liver function tests.   Family history includes stroke, myocardial infarction, diabetes, and  emphysema.   She has never smoked.   She is on:  1. Aspirin 81 mg daily.  2. Norvasc 2.5 mg daily,  3. Actonel 35 mg weekly.  4. Atenolol 50 mg daily.  5. Crestor 20 mg one-half daily.  6. Diovan 20 mg daily.  7. Lasix 40 mg daily.  8. Multivitamin.   HYDROCHLOROTHIAZIDE results in hypokalemia.  Uric acid was elevated with  TRIAMTERENE/HYDROCHLOROTHIAZIDE.   CBC and diff, RA and sed rate will be collected.  She was placed on  Celebrex 200 mg twice a day with diazepam 0.5 to 1 at bedtime.   Films of the neck  revealed retrolisthesis of C3 on C4 by 3.4 mm.  She  had diffuse degenerative disc disease from C3 to C7 with no significant  foraminal narrowing.   She states she is continuing to have symptoms and referral to physical  therapy will be considered with diathermy.  An abbreviated course of  prednisone will be initiated.   There was no evidence of weakness or neurologic impairment on exam but  close monitoring will be conducted for such.  Change in the clinical  picture such as radicular pain or demonstrable extremity weakness would  dictate evaluation by neurosurgeon.     Titus Dubin. [name], MD,FACP,FCCP  Electronically Signed    [facility]  DD: 06/01/2006  DT: 06/01/2006  Job #: [id number]

## 2010-09-19 NOTE — Discharge Summary (Signed)
 [place]  Patient:    Sheila Morgan, Sheila Morgan Visit Number: [id number] MRN: [id number]          Service Type: MED Location: [id number] Attending Physician:  Berry, Jonathan [place] Dictated by:   Abelino Derrick, P.A.C. Admit Date:  03/09/2001 Disc. Date: 03/21/01   CC:         [facility] [address] [place]. [name], M.D. [facility]   Discharge Summary  DISCHARGE DIAGNOSES:  Abnormal Cardiolite study with catheterization done March 04, 2001, revealing a patent left internal mammary artery to the left anterior descending artery, distal right coronary artery disease in the native right coronary artery, and patent circumflex.  HOSPITAL COURSE:  She was started on Plavix and continued on aspirin and set up for percutaneous intervention of the right coronary artery.  This was done March 09, 2001, by Dr. Allyson Sabal.  He had a good result with the distal right coronary artery dilated and stented.  The plan was to send her home the next morning, but she developed a hematoma of the right groin.  Hemoglobin dropped to 10.8.  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  Her aneurysm was compressed.  Unfortunately later on November 8, the site had reopened, and this was compressed again.  The plan was to try to mobilize her and get her home soon.  We ended up transfusing her a few units as her hemoglobin dropped to 8.4.  An ultrasound done again on November 10 showed the pseudoaneurysm to be thrombosed.  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  Dr. Alanda Amass reviewed this and felt that A-V fistula could be followed.  The remainder of her hospital course was mainly mobilization.  She is still very tender at the site and has a significant amount of hematoma left. We feel she can be discharged March 21, 2001.   She was not discharged on  Tylenol with codeine because she had significant constipation prior to discharge.  DISCHARGE MEDICATIONS:  1. Surfak 240 mg a day.  2. Aspirin q.d.  3. Toprol XL 100 mg a day.  4. Lipitor 20 mg a day.  5. Plavix 75 mg a day.  6. Diovan 160 mg q.d.  7. Niferex 150 q.d.  8. Hydrochlorothiazide 25 mg a day.  9. Potassium 20 mEq a day. 10. Foltx once a day. 11. Darvocet p.r.n.  LABORATORY DATA:  Sodium 139, potassium 3.7, BUN 21, creatinine 1.0, white count 11.  Hemoglobin 10.4, hematocrit 29.9, platelets 449.  INR 1.0. Urinalysis shows E. coli greater than 100,000, and this will be treated with Cipro at discharge.  DISPOSITION:  The patient is discharged in stable condition and will get home health physical therapy.  She will be seen in the office in about a week by one of the PAs and then by Dr. Allyson Sabal in a month or so. Dictated by:   Abelino Derrick, P.A.C. Attending Physician:  Berry, Jonathan [place] DD:  03/21/01 TD:  03/21/01 Job: [id number]

## 2010-09-19 NOTE — Cardiovascular Report (Signed)
[place]  Patient:    Sheila Morgan, Sheila Morgan Visit Number: [id number] MRN: [id number]          Service Type: CAT Location: 3700 [id number] Attending Physician:  Berry, Jonathan [place] Dictated by:   Runell Gess, M.D. Proc. Date: 03/09/01 Admit Date:  03/09/2001   CC:         Cardiac Catheterization Laboratory  Titus Dubin. [name], M.D. [facility]  [facility], [place] [address] [place], [place] [address]   Cardiac Catheterization  PROCEDURES PERFORMED: Cardiac catheterization/percutaneous transluminal coronary angioplasty and stent.  INDICATIONS: The patient is a 77 year old female, with known CAD, status post remote LAD angioplasty, April 25, 1992, and ultimately coronary artery bypass grafting x1 with a LIMA to her LAD, July 1994. The other problems include hypertension and hyperlipidemia. She was recently seen in the office complaining of indigestion and fatigue with some dyspnea. A Persantine Cardiolite performed October 17 revealed anteroapical and lateral ischemia. She was catheterized by Dr. Susa Griffins last Friday revealing patent LIMA, high-grade diagonal lesion and a 95% "apple core" lesion in the distal RCA at the "crux" just before the takeoff of the PDA. She presents now for PCI.  DESCRIPTION OF PROCEDURE: The patient was brought to the second floor cardiac catheterization lab in a postabsorptive state. She was premedicated with p.o. Valium and IV Versed.  Her right groin was prepped and shaved in the usual sterile fashion.  Xylocaine 1% was used for local anesthesia.  A 7 French sheath was inserted into the right femoral artery using standard Seldinger technique.  A 6 French sheath was inserted into the right femoral vein. A 7 [place] guide catheter with side holes along with an 0.014 190 Sport guide wire and a 2.5 x 15 Maverick balloon were used for pre-dilatation. The patient received 3500 units of  heparin intravenously with an ACT documented of approximately 250. She had been on aspirin and received Plavix 150 mg p.o. as well as Integrilin double bolus and infusion.  One pre-dilatation inflation was performed using the Maverick at 8 atmospheres for 45 seconds. A total of 200 mcg of intracoronary nitroglycerin were then administered. Following this, a 2.75 x 8 mm long Cordis Bx Velocity stent was then deployed focally just before the takeoff of the PDA at 15 atmospheres resulting in reduction of a 95% "apple core" lesion to 0% residual without dissection with TIMI-3 flow. The patient tolerated the procedure well. There were no hemodynamic of electrocardiographic sequela.  OVERALL IMPRESSION: Successful distal right coronary artery, percutaneous coronary intervention and stenting using adjunctive glycoprotein IIb/IIIa inhibition. The guide catheter and wires were removed. The sheaths were sewn securely in place. The patient left the lab in stable condition.  PLAN: Plans will be to discontinue heparin and continue Integrilin for 18 hours. The patient will be treated with aspirin and Plavix and will be discharged home in the morning if she remains clinically stable overnight. She left the lab in stable condition. Dictated by:   Runell Gess, M.D. Attending Physician:  Berry, Jonathan [place] DD:  03/09/01 TD:  03/10/01 Job: [id number]

## 2010-09-19 NOTE — Discharge Summary (Signed)
. [facility]  Patient:    Sheila Morgan, Sheila Morgan Visit Number: [id number] MRN: [id number]          Service Type: MED Location: 3700 3739 [id number] Attending Physician:  Berry, Jonathan [place] Dictated by:   [id number] Admit Date:  03/09/2001 Discharge Date: 03/21/2001                             Discharge Summary  NO DICTATION. Dictated by:   [id number] Attending Physician:  Berry, Jonathan [place] DD:  05/13/01 TD:  05/14/01 Job: [id number]

## 2010-12-13 ENCOUNTER — Other Ambulatory Visit: Payer: Self-pay | Admitting: Internal Medicine

## 2010-12-15 NOTE — Telephone Encounter (Signed)
Lipid/Hep 272.4/995.20  

## 2010-12-29 ENCOUNTER — Encounter: Payer: Self-pay | Admitting: Internal Medicine

## 2011-01-05 ENCOUNTER — Other Ambulatory Visit: Payer: Self-pay | Admitting: Internal Medicine

## 2011-01-19 ENCOUNTER — Other Ambulatory Visit: Payer: Self-pay | Admitting: Internal Medicine

## 2011-01-21 ENCOUNTER — Other Ambulatory Visit: Payer: Self-pay | Admitting: Internal Medicine

## 2011-01-21 NOTE — Telephone Encounter (Signed)
Refill triameterene & simvastatin (request for simvastatin was received from pharmacy [phone] but not sent)  [facility] [address]

## 2011-01-21 NOTE — Telephone Encounter (Signed)
DUPLICATE

## 2011-01-21 NOTE — Telephone Encounter (Signed)
LIPID/HEP 272.4/995.20  

## 2011-02-06 ENCOUNTER — Ambulatory Visit (INDEPENDENT_AMBULATORY_CARE_PROVIDER_SITE_OTHER)
Admission: RE | Admit: 2011-02-06 | Discharge: 2011-02-06 | Disposition: A | Discharge status: Home / Self Care | Payer: Medicare Other | Source: Ambulatory Visit | Attending: Internal Medicine | Admitting: Internal Medicine

## 2011-02-06 ENCOUNTER — Encounter: Payer: Self-pay | Admitting: Internal Medicine

## 2011-02-06 ENCOUNTER — Ambulatory Visit (INDEPENDENT_AMBULATORY_CARE_PROVIDER_SITE_OTHER): Payer: Medicare Other | Admitting: Internal Medicine

## 2011-02-06 DIAGNOSIS — I10 Essential (primary) hypertension: Secondary | ICD-10-CM

## 2011-02-06 DIAGNOSIS — M79672 Pain in left foot: Secondary | ICD-10-CM

## 2011-02-06 DIAGNOSIS — M79609 Pain in unspecified limb: Secondary | ICD-10-CM

## 2011-02-06 DIAGNOSIS — R609 Edema, unspecified: Secondary | ICD-10-CM

## 2011-02-06 LAB — CBC WITH DIFFERENTIAL/PLATELET
Basophils Absolute: 0.1 K/uL (ref 0.0–0.1)
Basophils Relative: 0.4 % (ref 0.0–3.0)
Eosinophils Absolute: 0.1 K/uL (ref 0.0–0.7)
Eosinophils Relative: 0.4 % (ref 0.0–5.0)
HCT: 37.1 % (ref 36.0–46.0)
Hemoglobin: 12.2 g/dL (ref 12.0–15.0)
Lymphocytes Relative: 10.5 % — ABNORMAL LOW (ref 12.0–46.0)
Lymphs Abs: 1.6 K/uL (ref 0.7–4.0)
MCHC: 32.9 g/dL (ref 30.0–36.0)
MCV: 90.3 fl (ref 78.0–100.0)
Monocytes Absolute: 1.3 K/uL — ABNORMAL HIGH (ref 0.1–1.0)
Monocytes Relative: 8.6 % (ref 3.0–12.0)
Neutro Abs: 12.3 K/uL — ABNORMAL HIGH (ref 1.4–7.7)
Neutrophils Relative %: 80.1 % — ABNORMAL HIGH (ref 43.0–77.0)
Platelets: 248 K/uL (ref 150.0–400.0)
RBC: 4.11 Mil/uL (ref 3.87–5.11)
RDW: 14.8 % — ABNORMAL HIGH (ref 11.5–14.6)
WBC: 15.4 K/uL — ABNORMAL HIGH (ref 4.5–10.5)

## 2011-02-06 LAB — URIC ACID: Uric Acid, Serum: 8.4 mg/dL — ABNORMAL HIGH (ref 2.4–7.0)

## 2011-02-06 MED ORDER — INDOMETHACIN ER 75 MG PO CPCR: 75.0000 mg | ORAL_CAPSULE | Freq: Two times a day (BID) | ORAL | Status: AC

## 2011-02-06 NOTE — Patient Instructions (Signed)
Stop the blood pressure medicine triamterene/HCTZ.Blood Pressure Goal  Ideally is an AVERAGE < 135/85. This AVERAGE should be calculated from @ least 5-7 BP readings taken @ different times of day on different days of week. You should not respond to isolated BP readings , but rather the AVERAGE for that week Order for x-rays entered into  the computer; these will be performed at [address] [facility]. across from [facility]. No appointment is necessary.

## 2011-02-06 NOTE — Progress Notes (Signed)
  Subjective:    Patient ID: Sheila Morgan, female    DOB: [birth year], 77 y.o.   MRN: [id number]  HPI Extremity pain Location:L lateral foot Onset:5-6 days ago Trigger/injury:no Pain quality:sharp Pain severity: up to 10 Duration:constant Radiation:no Exacerbating factors:walking Treatment/response:topical alcohol & Tylenol Arthritis w/o benefit Review of systems: Constitutional: no fever, chills, sweats,  weight loss Musculoskeletal:no  muscle cramps or pain. Some   joint stiffness, redness, & swelling Skin:no rash  There is no past medical history of gout or family history of such. She is on triamterene/hydrochlorothiazide for her hypertension   Review of Systems     Objective:   Physical Exam she is in no acute distress but is obviously uncomfortable.  She using an old  walker for ambulation assistance.  Pedal pulses are intact  There are mild deformities of the  toenails.  There is visible swelling below the ankle with mild plethora and increased temperature to touch. This area is exquisitely tender        Assessment & Plan:  #1 ankle/foot pain swelling and redness. Gout versus, fracture of small bone, or osteomyelitis. Absence of fever it is against osteomyelitis. Hydrochlorothiazide may be inducing gallop.  Plan: See orders and recommendations

## 2011-02-09 ENCOUNTER — Other Ambulatory Visit: Payer: Self-pay | Admitting: Internal Medicine

## 2011-02-10 ENCOUNTER — Emergency Department (HOSPITAL_COMMUNITY)
Admission: EM | Admit: 2011-02-10 | Discharge: 2011-02-10 | Disposition: A | Discharge status: Home / Self Care | Payer: Medicare Other | Attending: Emergency Medicine | Admitting: Emergency Medicine

## 2011-02-10 ENCOUNTER — Telehealth: Payer: Self-pay | Admitting: *Deleted

## 2011-02-10 DIAGNOSIS — M545 Low back pain, unspecified: Secondary | ICD-10-CM | POA: Insufficient documentation

## 2011-02-10 DIAGNOSIS — Z96649 Presence of unspecified artificial hip joint: Secondary | ICD-10-CM | POA: Insufficient documentation

## 2011-02-10 DIAGNOSIS — Z951 Presence of aortocoronary bypass graft: Secondary | ICD-10-CM | POA: Insufficient documentation

## 2011-02-10 DIAGNOSIS — I10 Essential (primary) hypertension: Secondary | ICD-10-CM | POA: Insufficient documentation

## 2011-02-10 DIAGNOSIS — N390 Urinary tract infection, site not specified: Secondary | ICD-10-CM | POA: Insufficient documentation

## 2011-02-10 LAB — URINALYSIS, ROUTINE W REFLEX MICROSCOPIC
Bilirubin Urine: NEGATIVE
Nitrite: NEGATIVE
Protein, ur: NEGATIVE mg/dL
Specific Gravity, Urine: 1.018 (ref 1.005–1.030)
Urobilinogen, UA: 0.2 mg/dL (ref 0.0–1.0)

## 2011-02-10 LAB — URINE MICROSCOPIC-ADD ON

## 2011-02-10 NOTE — Telephone Encounter (Signed)
Pt c/o lower back pain and unable to sit due to pain. Pt denies any recent injury, tenderness or swelling, urgency, burning or frequency with urination. Pt advise per scheduler next available appt is for tomorrow Pt decline. Pt offered OV with another physician Pt decline only wants to see Dr hopper. Pt indicated that she would just go to ED.

## 2011-02-11 ENCOUNTER — Other Ambulatory Visit: Payer: Self-pay | Admitting: Internal Medicine

## 2011-02-11 ENCOUNTER — Telehealth: Payer: Self-pay

## 2011-02-11 DIAGNOSIS — M79672 Pain in left foot: Secondary | ICD-10-CM

## 2011-02-11 NOTE — Telephone Encounter (Signed)
Message copied by Edgardo Roys on Wed Feb 11, 2011  2:24 PM ------      Message from: Pecola Lawless      Created: Sat Feb 07, 2011 12:14 PM       Osteoarthritis ( degenerative changes) with local swelling; acute gout can not be ruled out. I recommend a Rheumatology  consultation  If symptoms persist or progress despite meds. [employer]

## 2011-02-11 NOTE — Telephone Encounter (Signed)
Spoke with patient, patient states her foot is still a little swollen. Patient would like to see specialist .  Dr.Hopper please advise on referral

## 2011-02-16 NOTE — Telephone Encounter (Signed)
Referral placed.

## 2011-02-23 ENCOUNTER — Other Ambulatory Visit: Payer: Self-pay | Admitting: Internal Medicine

## 2011-02-24 ENCOUNTER — Other Ambulatory Visit: Payer: Self-pay | Admitting: Internal Medicine

## 2011-02-24 NOTE — Telephone Encounter (Signed)
LIPID/HEP 272.4/995.20  

## 2011-03-06 ENCOUNTER — Other Ambulatory Visit: Payer: Self-pay | Admitting: Internal Medicine

## 2011-03-27 ENCOUNTER — Other Ambulatory Visit: Payer: Self-pay | Admitting: Internal Medicine

## 2011-03-27 NOTE — Telephone Encounter (Signed)
Lipid/Hep 272.4/995.20  

## 2011-04-08 ENCOUNTER — Other Ambulatory Visit: Payer: Self-pay | Admitting: Family Medicine

## 2011-04-08 ENCOUNTER — Ambulatory Visit
Admission: RE | Admit: 2011-04-08 | Discharge: 2011-04-08 | Disposition: A | Discharge status: Home / Self Care | Payer: Medicare Other | Source: Ambulatory Visit | Attending: Family Medicine | Admitting: Family Medicine

## 2011-04-08 DIAGNOSIS — M79606 Pain in leg, unspecified: Secondary | ICD-10-CM

## 2011-04-15 ENCOUNTER — Encounter: Payer: Self-pay | Admitting: Internal Medicine

## 2011-04-17 ENCOUNTER — Ambulatory Visit (INDEPENDENT_AMBULATORY_CARE_PROVIDER_SITE_OTHER): Payer: Medicare Other | Admitting: Internal Medicine

## 2011-04-17 DIAGNOSIS — E119 Type 2 diabetes mellitus without complications: Secondary | ICD-10-CM

## 2011-04-17 DIAGNOSIS — R609 Edema, unspecified: Secondary | ICD-10-CM

## 2011-04-17 DIAGNOSIS — I10 Essential (primary) hypertension: Secondary | ICD-10-CM

## 2011-04-17 LAB — BASIC METABOLIC PANEL
BUN: 17 mg/dL (ref 6–23)
CO2: 31 mEq/L (ref 19–32)
Calcium: 9.9 mg/dL (ref 8.4–10.5)
Creatinine, Ser: 1 mg/dL (ref 0.4–1.2)

## 2011-04-17 LAB — MICROALBUMIN / CREATININE URINE RATIO
Creatinine,U: 20.9 mg/dL
Microalb Creat Ratio: 8.6 mg/g (ref 0.0–30.0)
Microalb, Ur: 1.8 mg/dL (ref 0.0–1.9)

## 2011-04-17 LAB — HEPATIC FUNCTION PANEL
Bilirubin, Direct: 0.1 mg/dL (ref 0.0–0.3)
Total Protein: 8.1 g/dL (ref 6.0–8.3)

## 2011-04-17 MED ORDER — FUROSEMIDE 40 MG PO TABS: 20.0000 mg | ORAL_TABLET | Freq: Every day | ORAL | Status: DC

## 2011-04-17 NOTE — Assessment & Plan Note (Signed)
She is on amlodipine 10 mg daily which can be associated with significant edema. This will be changed to a calcium channel blocker which is not associated with edema.

## 2011-04-17 NOTE — Progress Notes (Signed)
  Subjective:    Patient ID: Sheila Morgan, female    DOB: 09-Feb-1933, 77 y.o.   MRN: [id number]  HPI  Edema Onset:in late September in LLE; now in RLE in late November Possible triggers : new meds, furosemide and doxycycline, were prescribed at the orthopedic office , but she already had the edema. She denies excessive salt intake. Significantly she is on amlodipine 10 mg daily. Review of Systems Constitutional: some fatigue;no  sleep disturbance                                                       Cardiovascular: no palpitations; tachycardia; claudication; paroxysmal nocturnal dyspnea Pulmonary:no cough; sputum reduction; exertional dyspnea Endocrinologic:no significant weight change; skin/hair/nail changes. She describes cold intolerance.      Objective:   Physical Exam Appears younger than stated age . She appears healthy and well-nourished  Thyroid is normal to palpation without enlargement nodules.  There is no neck vein distention at 15.  Chest is clear except for minimal rhonchi at the bases.  She has a grade 1-1.5 systolic murmur at the base.  She has no organomegaly or masses; there is no hepatojugular reflux  All pulses intact without bruits.  She has no clubbing or cyanosis. 1/2+ pedal edema is noted        Assessment & Plan:  #1 edema, now bilateral. Renal and hepatic function will be checked. Amlodipine will be weaned and discontinued. Verapamil is not an option as she is on a beta blocker. She should continue the furosemide one half pill daily of amlodipine.

## 2011-04-17 NOTE — Patient Instructions (Addendum)
Take one half pill of amlodipine 10 mg until you  run out and then stop the medication.  Monitor your blood pressure off amlodipine.Blood Pressure Goal  Ideally is an AVERAGE < 135/85. This AVERAGE should be calculated from @ least 5-7 BP readings taken @ different times of day on different days of week. You should not respond to isolated BP readings , but rather the AVERAGE for that week Your BP goal = AVERAGE < 135/85.  Avoid ingestion of  excess salt/sodium.Cook with pepper & other spices . Use the salt substitute "No Salt"(unless your potassium has been elevated) OR the Mrs Sharilyn Sites products to season food @ the table. Avoid foods which taste salty or "vinegary" as their sodium contentet will be high.  Take the furosemide 20 mg daily if you have significant swelling.

## 2011-04-17 NOTE — Assessment & Plan Note (Signed)
Her last A1c was 6.3% in August of 2011. A1c and urine microalbumin will be checked today.

## 2011-04-27 ENCOUNTER — Other Ambulatory Visit: Payer: Self-pay | Admitting: Internal Medicine

## 2011-04-27 ENCOUNTER — Telehealth: Payer: Self-pay

## 2011-04-27 MED ORDER — POTASSIUM CHLORIDE CRYS ER 20 MEQ PO TBCR: EXTENDED_RELEASE_TABLET | ORAL | Status: DC

## 2011-04-27 NOTE — Telephone Encounter (Signed)
Message copied by Maurice Small on Mon Apr 27, 2011  8:53 AM ------      Message from: Pecola Lawless      Created: Fri Apr 24, 2011  6:39 AM       Please call Rx  to drug store for KCL 20 Meq daily if  Furosemide taken. # 30

## 2011-04-27 NOTE — Telephone Encounter (Signed)
RX sent

## 2011-04-30 ENCOUNTER — Ambulatory Visit (INDEPENDENT_AMBULATORY_CARE_PROVIDER_SITE_OTHER): Payer: Medicare Other | Admitting: *Deleted

## 2011-04-30 DIAGNOSIS — E876 Hypokalemia: Secondary | ICD-10-CM

## 2011-04-30 DIAGNOSIS — E785 Hyperlipidemia, unspecified: Secondary | ICD-10-CM

## 2011-04-30 DIAGNOSIS — I10 Essential (primary) hypertension: Secondary | ICD-10-CM

## 2011-04-30 NOTE — Progress Notes (Signed)
Patient ID: Sheila Morgan, female   DOB: 10/29/1932, 77 y.o.   MRN: [id number] Patient walked into office with questions concerning her medications and changes made at last OV. Per Dr. [name]; checked BP, entered lab orders for Potassium & Lipid panel in 10 days fasting and F/U appointment in 14 days. Pt is to stop Amlodipine & Atenolol; pt was given new written Rx for Carvedilol to start in place of d/c medications. Pt is to continue Klor-Con daily and use Furosemide only with severe swelling. Pt is to check BP 5-7 times daily at different times of the day and different days of the week; keep the log and bring to f/u OV. Pt is to bring ALL medications in the bottles/containers to f/u OV.

## 2011-05-08 ENCOUNTER — Other Ambulatory Visit: Payer: Self-pay | Admitting: Internal Medicine

## 2011-05-08 DIAGNOSIS — E876 Hypokalemia: Secondary | ICD-10-CM

## 2011-05-10 ENCOUNTER — Other Ambulatory Visit: Payer: Self-pay | Admitting: Internal Medicine

## 2011-05-11 ENCOUNTER — Other Ambulatory Visit: Payer: Medicare Other

## 2011-05-11 DIAGNOSIS — E876 Hypokalemia: Secondary | ICD-10-CM

## 2011-05-11 LAB — POTASSIUM: Potassium: 4 mEq/L (ref 3.5–5.1)

## 2011-05-14 ENCOUNTER — Encounter: Payer: Self-pay | Admitting: Internal Medicine

## 2011-05-14 ENCOUNTER — Ambulatory Visit (INDEPENDENT_AMBULATORY_CARE_PROVIDER_SITE_OTHER): Payer: Medicare Other | Admitting: Internal Medicine

## 2011-05-14 VITALS — BP 144/96 | HR 78 | Wt 158.6 lb

## 2011-05-14 DIAGNOSIS — I10 Essential (primary) hypertension: Secondary | ICD-10-CM

## 2011-05-14 MED ORDER — CARVEDILOL 12.5 MG PO TABS: 12.5000 mg | ORAL_TABLET | Freq: Two times a day (BID) | ORAL | Status: DC

## 2011-05-14 NOTE — Progress Notes (Signed)
  Subjective:    Patient ID: Sheila Morgan, female    DOB: [birth year], 78 y.o.   MRN: [id number]  HPI CHRONIC HYPERTENSION: Disease Monitoring  Blood pressure range: 122/60- 177/85              Chest pain: no  Dyspnea: no   Claudication: no   Medication compliance: yes  Medication Side Effects  Lightheadedness: yes, intermittently   Urinary frequency: no   Edema: yes, but improving      Preventitive Healthcare:  Exercise: no due to foot issues  Diet Pattern:no plan  Salt Restriction: yes     Review of Systems     Objective:   Physical Exam She appears healthy and well-nourished; & she is in no acute distress  There is a very faint L carotid  bruit.  Heart rhythm and rate are normal with Grade 1 /6 systolic  Murmur; no gallop  Chest is clear with no increased work of breathing  There is no evidence of aortic aneurysm or renal artery bruits  She has no clubbing or edema.   Pedal pulses are intact   No ischemic skin changes are present         Assessment & Plan:

## 2011-05-14 NOTE — Assessment & Plan Note (Signed)
Blood pressure is suboptimally controlled; carvedilol will be increased to 12.5 mg twice a day. She is to take the furosemide only if there is swelling at the ankles, not just the foot itself.

## 2011-05-14 NOTE — Patient Instructions (Addendum)
Blood Pressure Goal  Ideally is an AVERAGE < 140/90; ideally < 135/85. This AVERAGE should be calculated from @ least 5-7 BP readings taken @ different times of day on different days of week. You should not respond to isolated BP readings , but rather the AVERAGE for that week .  Her potassium is now normal. She is not to take the potassium unless she has taken her furosemide for swelling of the ankles.

## 2011-05-26 ENCOUNTER — Telehealth: Payer: Self-pay | Admitting: Internal Medicine

## 2011-05-26 MED ORDER — CARVEDILOL 12.5 MG PO TABS: ORAL_TABLET | ORAL | Status: DC

## 2011-05-26 NOTE — Telephone Encounter (Signed)
Increase carvedilol 12.5 mg from one twice a day to 1&1/2 twice a day; see me if the blood pressure is not well-controlled after 5-7 days..Please document this dose change on your pill bottle & with your Pharmacist. Verify the change in dose  @ all doctor visits also.

## 2011-05-26 NOTE — Telephone Encounter (Signed)
Discuss with patient  

## 2011-05-26 NOTE — Telephone Encounter (Signed)
Patient states that she was supposed to call back to office to let Dr. [name] know that her bp was still running high. Last time she checked, her bp was 169/87

## 2011-05-28 DIAGNOSIS — S335XXA Sprain of ligaments of lumbar spine, initial encounter: Secondary | ICD-10-CM | POA: Diagnosis not present

## 2011-06-02 ENCOUNTER — Encounter: Payer: Self-pay | Admitting: Internal Medicine

## 2011-06-02 ENCOUNTER — Ambulatory Visit (INDEPENDENT_AMBULATORY_CARE_PROVIDER_SITE_OTHER): Payer: Medicare Other | Admitting: Internal Medicine

## 2011-06-02 DIAGNOSIS — E785 Hyperlipidemia, unspecified: Secondary | ICD-10-CM | POA: Diagnosis not present

## 2011-06-02 DIAGNOSIS — I10 Essential (primary) hypertension: Secondary | ICD-10-CM | POA: Diagnosis not present

## 2011-06-02 MED ORDER — SIMVASTATIN 20 MG PO TABS: 20.0000 mg | ORAL_TABLET | Freq: Every day | ORAL | Status: DC

## 2011-06-02 MED ORDER — CARVEDILOL 25 MG PO TABS: 25.0000 mg | ORAL_TABLET | Freq: Two times a day (BID) | ORAL | Status: DC

## 2011-06-02 MED ORDER — LOSARTAN POTASSIUM 100 MG PO TABS: 100.0000 mg | ORAL_TABLET | Freq: Every day | ORAL | Status: DC

## 2011-06-02 NOTE — Assessment & Plan Note (Addendum)
Carvedilol will be increased to 12.5 mg 2 twice a day. When she has completed current 12.5 mg supply the prescription will be for 25 mg pills twice a day. Calcium channel blocker may be considered if blood pressure control is suboptimal.

## 2011-06-02 NOTE — Assessment & Plan Note (Signed)
She has been off her statin because of potential drug drug interaction. No risk is felt to be present. This will be restarted with repeat lipids after 3 months.

## 2011-06-02 NOTE — Progress Notes (Signed)
  Subjective:    Patient ID: Sheila Morgan, female    DOB: 1933-03-30, 78 y.o.   MRN: [id number]  HPI Blood pressures range from 150/71 to a high of 177/82 despite increase in her medications; carvedilol dose  is 12.5 mg 1&1/2 pills twice a day and losartan 100 mg daily. She is not a candidate for HCTZ because of possible history of gout. She denies chest pain, palpitations, edema, claudication, dyspnea on exertion, or paroxysmal nocturnal dyspnea.    Review of Systems     Objective:   Physical Exam She appears healthy and well-nourished;s he is in no acute distress  No carotid bruits are present.  Heart rhythm and rate are normal with no  gallops.Grade 1/6 systolic murmur   Chest is clear with no increased work of breathing  There is no evidence of aortic aneurysm or renal artery bruits  She has no clubbing or edema.   Pedal pulses are intact   No ischemic skin changes are present         Assessment & Plan:

## 2011-06-02 NOTE — Patient Instructions (Signed)
Your BP goal = AVERAGE < 135/85. Avoid ingestion of  excess salt/sodium.Cook with pepper & other spices . Use the salt substitute "No Salt"(unless your potassium has been elevated) OR the Mrs Sheila Morgan products to season food @ the table. Avoid foods which taste salty or "vinegary" as their sodium contentet will be high.  Please  schedule fasting Labs in 3 months : BMET,Lipids, hepatic panel,  TSH in 3 months. PLEASE BRING THESE INSTRUCTIONS TO FOLLOW UP  LAB APPOINTMENT.This will guarantee correct labs are drawn, eliminating need for repeat blood sampling ( needle sticks ! ). Diagnoses /Codes: 401.9, 272.4, 995.20

## 2011-06-16 ENCOUNTER — Encounter: Payer: Self-pay | Admitting: Internal Medicine

## 2011-06-16 ENCOUNTER — Ambulatory Visit (INDEPENDENT_AMBULATORY_CARE_PROVIDER_SITE_OTHER): Payer: Medicare Other | Admitting: Internal Medicine

## 2011-06-16 VITALS — BP 144/92 | HR 69 | Wt 158.0 lb

## 2011-06-16 DIAGNOSIS — M79609 Pain in unspecified limb: Secondary | ICD-10-CM | POA: Diagnosis not present

## 2011-06-16 DIAGNOSIS — R609 Edema, unspecified: Secondary | ICD-10-CM

## 2011-06-16 DIAGNOSIS — I10 Essential (primary) hypertension: Secondary | ICD-10-CM

## 2011-06-16 DIAGNOSIS — M79641 Pain in right hand: Secondary | ICD-10-CM

## 2011-06-16 MED ORDER — AMLODIPINE BESYLATE 5 MG PO TABS: 5.0000 mg | ORAL_TABLET | Freq: Every day | ORAL | Status: DC

## 2011-06-16 MED ORDER — TRAMADOL HCL 50 MG PO TABS: 50.0000 mg | ORAL_TABLET | Freq: Four times a day (QID) | ORAL | Status: AC | PRN

## 2011-06-16 MED ORDER — FUROSEMIDE 40 MG PO TABS: 20.0000 mg | ORAL_TABLET | Freq: Every day | ORAL | Status: DC

## 2011-06-16 NOTE — Patient Instructions (Addendum)
Blood Pressure Goal  Ideally is an AVERAGE < 135/85. This AVERAGE should be calculated from @ least 5-7 BP readings taken @ different times of day on different days of week. You should not respond to isolated BP readings , but rather the AVERAGE for that week Please bring your  blood pressure cuff to office visits to verify that it is reliable.It  can also be checked against the blood pressure device at the pharmacy. Finger or wrist cuffs are not dependable; an arm cuff is   Use warm moist compresses to 3 times a day to the affected area & elevate hand as much as possible

## 2011-06-16 NOTE — Progress Notes (Signed)
  Subjective:    Patient ID: Sheila Morgan, female    DOB: [birth year], 78 y.o.   MRN: [id number]  HPI Despite carvedilol 25 mg twice a day and losartan 100 mg daily her blood pressure remains elevated. Home recordings ranged from 140/72 to high of 188/98. She does use an arm cuff and not a finger or wrist cuff. On 04/17/11 potassium was 3.2; supplementation was initiated. Potassium was 4 on 05/11/11. In December her BUN was 17 and creatinine 1.0. She denies chest pain, palpitations, dyspnea, paroxysmal nocturnal dyspnea, or ankle edema. She has had edema of the right hand.    Review of Systems  On  06/07/11 she had difficulty opening a bottle. The next day she noted swelling and pain in the hand; there was no pain acutely with the event. Tylenol has not helped significantly     Objective:   Physical Exam She appears healthy and well-nourished;she is in no acute distress but is uncomfortable due to the hand symptoms.  No carotid bruits are present.  Heart rhythm and rate are normal.Grade 1/6 systolic murmur  ;no  gallops.  Chest is clear with no increased work of breathing  There is no evidence of aortic aneurysm or renal artery bruits  Se has no clubbing or edema other than the right hand which is markedly swollen from the wrist to the PIP joints. This is tender to touch and slight increased temperature. Clinically there is no evidence of cellulitis   Pedal pulses are intact   No ischemic skin changes are present         Assessment & Plan:    The pain and swelling in the hand appeared 24 hours after she attempted to open a bottle. It is possible she had some soft tissue injury of the hand. Imaging is unlikely to be of benefit. There has been history of possible gout; uric acid will be checked

## 2011-06-16 NOTE — Assessment & Plan Note (Signed)
Blood pressure control is suboptimal despite an agents receptor blocker and a dual agent carvedilol. The calcium channel blocker will be added and titrated. If the blood pressure fails to be controlled renal artery imaging will be pursued to rule out renal artery stenosis.

## 2011-06-18 ENCOUNTER — Other Ambulatory Visit: Payer: Medicare Other

## 2011-06-18 LAB — BASIC METABOLIC PANEL
BUN: 14 mg/dL (ref 6–23)
Chloride: 92 mEq/L — ABNORMAL LOW (ref 96–112)
GFR: 73.41 mL/min (ref >60.00)
Glucose, Bld: 97 mg/dL (ref 70–99)
Potassium: 2.9 mEq/L — ABNORMAL LOW (ref 3.5–5.1)
Sodium: 137 mEq/L (ref 135–145)

## 2011-06-18 LAB — URIC ACID: Uric Acid, Serum: 5 mg/dL (ref 2.4–7.0)

## 2011-06-19 ENCOUNTER — Telehealth: Payer: Self-pay | Admitting: Internal Medicine

## 2011-06-19 ENCOUNTER — Emergency Department (HOSPITAL_COMMUNITY): Payer: Medicare Other

## 2011-06-19 ENCOUNTER — Encounter (HOSPITAL_COMMUNITY): Payer: Self-pay | Admitting: *Deleted

## 2011-06-19 ENCOUNTER — Inpatient Hospital Stay (HOSPITAL_COMMUNITY)
Admission: EM | Admit: 2011-06-19 | Discharge: 2011-06-25 | DRG: 556 | Disposition: A | Discharge status: Home Health | Payer: Medicare Other | Attending: Internal Medicine | Admitting: Internal Medicine

## 2011-06-19 DIAGNOSIS — M79609 Pain in unspecified limb: Secondary | ICD-10-CM | POA: Diagnosis not present

## 2011-06-19 DIAGNOSIS — I2510 Atherosclerotic heart disease of native coronary artery without angina pectoris: Secondary | ICD-10-CM | POA: Diagnosis present

## 2011-06-19 DIAGNOSIS — M7989 Other specified soft tissue disorders: Secondary | ICD-10-CM | POA: Diagnosis not present

## 2011-06-19 DIAGNOSIS — M064 Inflammatory polyarthropathy: Secondary | ICD-10-CM | POA: Diagnosis present

## 2011-06-19 DIAGNOSIS — M25849 Other specified joint disorders, unspecified hand: Secondary | ICD-10-CM | POA: Diagnosis not present

## 2011-06-19 DIAGNOSIS — M109 Gout, unspecified: Secondary | ICD-10-CM | POA: Diagnosis present

## 2011-06-19 DIAGNOSIS — M353 Polymyalgia rheumatica: Secondary | ICD-10-CM | POA: Diagnosis present

## 2011-06-19 DIAGNOSIS — M810 Age-related osteoporosis without current pathological fracture: Secondary | ICD-10-CM | POA: Diagnosis present

## 2011-06-19 DIAGNOSIS — Z9861 Coronary angioplasty status: Secondary | ICD-10-CM

## 2011-06-19 DIAGNOSIS — E119 Type 2 diabetes mellitus without complications: Secondary | ICD-10-CM | POA: Diagnosis not present

## 2011-06-19 DIAGNOSIS — T380X5A Adverse effect of glucocorticoids and synthetic analogues, initial encounter: Secondary | ICD-10-CM | POA: Diagnosis present

## 2011-06-19 DIAGNOSIS — E876 Hypokalemia: Secondary | ICD-10-CM

## 2011-06-19 DIAGNOSIS — M1120 Other chondrocalcinosis, unspecified site: Secondary | ICD-10-CM | POA: Diagnosis present

## 2011-06-19 DIAGNOSIS — D72829 Elevated white blood cell count, unspecified: Secondary | ICD-10-CM | POA: Diagnosis present

## 2011-06-19 DIAGNOSIS — K449 Diaphragmatic hernia without obstruction or gangrene: Secondary | ICD-10-CM | POA: Diagnosis present

## 2011-06-19 DIAGNOSIS — R700 Elevated erythrocyte sedimentation rate: Secondary | ICD-10-CM | POA: Diagnosis present

## 2011-06-19 DIAGNOSIS — R269 Unspecified abnormalities of gait and mobility: Secondary | ICD-10-CM | POA: Diagnosis not present

## 2011-06-19 DIAGNOSIS — Z79899 Other long term (current) drug therapy: Secondary | ICD-10-CM

## 2011-06-19 DIAGNOSIS — M2550 Pain in unspecified joint: Principal | ICD-10-CM | POA: Diagnosis present

## 2011-06-19 DIAGNOSIS — D649 Anemia, unspecified: Secondary | ICD-10-CM | POA: Diagnosis present

## 2011-06-19 DIAGNOSIS — I10 Essential (primary) hypertension: Secondary | ICD-10-CM | POA: Diagnosis present

## 2011-06-19 DIAGNOSIS — Z85828 Personal history of other malignant neoplasm of skin: Secondary | ICD-10-CM

## 2011-06-19 DIAGNOSIS — M19049 Primary osteoarthritis, unspecified hand: Secondary | ICD-10-CM | POA: Diagnosis not present

## 2011-06-19 DIAGNOSIS — Z96649 Presence of unspecified artificial hip joint: Secondary | ICD-10-CM

## 2011-06-19 DIAGNOSIS — N289 Disorder of kidney and ureter, unspecified: Secondary | ICD-10-CM | POA: Diagnosis present

## 2011-06-19 DIAGNOSIS — E785 Hyperlipidemia, unspecified: Secondary | ICD-10-CM | POA: Diagnosis present

## 2011-06-19 DIAGNOSIS — N179 Acute kidney failure, unspecified: Secondary | ICD-10-CM | POA: Diagnosis present

## 2011-06-19 DIAGNOSIS — R197 Diarrhea, unspecified: Secondary | ICD-10-CM | POA: Diagnosis not present

## 2011-06-19 DIAGNOSIS — M79641 Pain in right hand: Secondary | ICD-10-CM

## 2011-06-19 DIAGNOSIS — R52 Pain, unspecified: Secondary | ICD-10-CM | POA: Diagnosis not present

## 2011-06-19 DIAGNOSIS — Z7982 Long term (current) use of aspirin: Secondary | ICD-10-CM

## 2011-06-19 DIAGNOSIS — Z951 Presence of aortocoronary bypass graft: Secondary | ICD-10-CM

## 2011-06-19 HISTORY — DX: Other specified postprocedural states: Z98.890

## 2011-06-19 HISTORY — DX: Nausea with vomiting, unspecified: R11.2

## 2011-06-19 LAB — DIFFERENTIAL
Basophils Absolute: 0 10*3/uL (ref 0.0–0.1)
Eosinophils Relative: 0 % (ref 0–5)
Lymphocytes Relative: 6 % — ABNORMAL LOW (ref 12–46)
Lymphs Abs: 0.6 10*3/uL — ABNORMAL LOW (ref 0.7–4.0)
Monocytes Absolute: 1.4 10*3/uL — ABNORMAL HIGH (ref 0.1–1.0)
Neutro Abs: 9.1 10*3/uL — ABNORMAL HIGH (ref 1.7–7.7)

## 2011-06-19 LAB — URINALYSIS, ROUTINE W REFLEX MICROSCOPIC
Bilirubin Urine: NEGATIVE
Glucose, UA: NEGATIVE mg/dL
Ketones, ur: NEGATIVE mg/dL
Protein, ur: NEGATIVE mg/dL
Urobilinogen, UA: 0.2 mg/dL (ref 0.0–1.0)

## 2011-06-19 LAB — CBC
HCT: 33 % — ABNORMAL LOW (ref 36.0–46.0)
MCV: 83.5 fL (ref 78.0–100.0)
Platelets: 326 10*3/uL (ref 150–400)
RBC: 3.95 MIL/uL (ref 3.87–5.11)
RDW: 14.5 % (ref 11.5–15.5)
WBC: 11.2 10*3/uL — ABNORMAL HIGH (ref 4.0–10.5)

## 2011-06-19 LAB — COMPREHENSIVE METABOLIC PANEL
ALT: 13 U/L (ref 0–35)
AST: 15 U/L (ref 0–37)
CO2: 33 mEq/L — ABNORMAL HIGH (ref 19–32)
Calcium: 9.8 mg/dL (ref 8.4–10.5)
Chloride: 89 mEq/L — ABNORMAL LOW (ref 96–112)
GFR calc Af Amer: 53 mL/min — ABNORMAL LOW (ref >90)
GFR calc non Af Amer: 46 mL/min — ABNORMAL LOW (ref >90)
Glucose, Bld: 145 mg/dL — ABNORMAL HIGH (ref 70–99)
Sodium: 132 mEq/L — ABNORMAL LOW (ref 135–145)
Total Bilirubin: 0.6 mg/dL (ref 0.3–1.2)

## 2011-06-19 LAB — URINE MICROSCOPIC-ADD ON

## 2011-06-19 MED ORDER — VITAMIN B-12 1000 MCG PO TABS
1000.0000 ug | ORAL_TABLET | Freq: Every day | ORAL | Status: DC
  Administered 2011-06-19 – 2011-06-25 (×7): 1000 ug via ORAL
  Filled 2011-06-19 (×8): qty 1

## 2011-06-19 MED ORDER — FUROSEMIDE 40 MG PO TABS
40.0000 mg | ORAL_TABLET | Freq: Every day | ORAL | Status: DC
  Administered 2011-06-19 – 2011-06-25 (×7): 40 mg via ORAL
  Filled 2011-06-19 (×8): qty 1

## 2011-06-19 MED ORDER — SIMVASTATIN 20 MG PO TABS
20.0000 mg | ORAL_TABLET | Freq: Every day | ORAL | Status: DC
  Administered 2011-06-19 – 2011-06-24 (×6): 20 mg via ORAL
  Filled 2011-06-19 (×8): qty 1

## 2011-06-19 MED ORDER — MORPHINE SULFATE 2 MG/ML IJ SOLN
2.0000 mg | Freq: Once | INTRAMUSCULAR | Status: AC
  Administered 2011-06-19: 2 mg via INTRAVENOUS
  Filled 2011-06-19: qty 1

## 2011-06-19 MED ORDER — LOSARTAN POTASSIUM 50 MG PO TABS
100.0000 mg | ORAL_TABLET | Freq: Every day | ORAL | Status: DC
  Administered 2011-06-19 – 2011-06-25 (×7): 100 mg via ORAL
  Filled 2011-06-19 (×8): qty 2

## 2011-06-19 MED ORDER — POTASSIUM CHLORIDE CRYS ER 20 MEQ PO TBCR
20.0000 meq | EXTENDED_RELEASE_TABLET | Freq: Every day | ORAL | Status: DC
  Administered 2011-06-19 – 2011-06-23 (×4): 20 meq via ORAL
  Filled 2011-06-19 (×8): qty 1

## 2011-06-19 MED ORDER — ENOXAPARIN SODIUM 40 MG/0.4ML ~~LOC~~ SOLN
40.0000 mg | SUBCUTANEOUS | Status: DC
  Administered 2011-06-19 – 2011-06-24 (×6): 40 mg via SUBCUTANEOUS
  Filled 2011-06-19 (×8): qty 0.4

## 2011-06-19 MED ORDER — ASPIRIN 81 MG PO CHEW
81.0000 mg | CHEWABLE_TABLET | Freq: Every day | ORAL | Status: DC
  Administered 2011-06-19 – 2011-06-25 (×7): 81 mg via ORAL
  Filled 2011-06-19 (×8): qty 1

## 2011-06-19 MED ORDER — TRAMADOL HCL 50 MG PO TABS
100.0000 mg | ORAL_TABLET | Freq: Four times a day (QID) | ORAL | Status: DC | PRN
  Administered 2011-06-19 – 2011-06-21 (×3): 100 mg via ORAL
  Filled 2011-06-19 (×3): qty 2

## 2011-06-19 MED ORDER — POTASSIUM CHLORIDE IN NACL 20-0.9 MEQ/L-% IV SOLN
INTRAVENOUS | Status: AC
  Administered 2011-06-19: 23:00:00 via INTRAVENOUS
  Filled 2011-06-19: qty 1000

## 2011-06-19 MED ORDER — ADULT MULTIVITAMIN W/MINERALS CH
1.0000 | ORAL_TABLET | Freq: Every day | ORAL | Status: DC
  Administered 2011-06-19 – 2011-06-25 (×7): 1 via ORAL
  Filled 2011-06-19 (×8): qty 1

## 2011-06-19 MED ORDER — SODIUM CHLORIDE 0.9 % IV SOLN
Freq: Once | INTRAVENOUS | Status: AC
  Administered 2011-06-19 (×2): via INTRAVENOUS

## 2011-06-19 MED ORDER — POTASSIUM CHLORIDE CRYS ER 20 MEQ PO TBCR
40.0000 meq | EXTENDED_RELEASE_TABLET | Freq: Once | ORAL | Status: AC
  Administered 2011-06-19: 40 meq via ORAL
  Filled 2011-06-19: qty 2

## 2011-06-19 MED ORDER — ISOSORBIDE MONONITRATE ER 30 MG PO TB24
30.0000 mg | ORAL_TABLET | Freq: Every day | ORAL | Status: DC
  Administered 2011-06-19 – 2011-06-25 (×7): 30 mg via ORAL
  Filled 2011-06-19 (×8): qty 1

## 2011-06-19 MED ORDER — CARVEDILOL 12.5 MG PO TABS
12.5000 mg | ORAL_TABLET | Freq: Two times a day (BID) | ORAL | Status: DC
  Administered 2011-06-20 – 2011-06-25 (×11): 12.5 mg via ORAL
  Filled 2011-06-19 (×15): qty 1

## 2011-06-19 MED ORDER — AMLODIPINE BESYLATE 5 MG PO TABS
5.0000 mg | ORAL_TABLET | Freq: Every day | ORAL | Status: DC
  Administered 2011-06-19 – 2011-06-25 (×7): 5 mg via ORAL
  Filled 2011-06-19 (×7): qty 1

## 2011-06-19 MED ORDER — COLCHICINE 0.6 MG PO TABS
0.6000 mg | ORAL_TABLET | ORAL | Status: DC | PRN
  Administered 2011-06-20 (×5): 0.6 mg via ORAL
  Filled 2011-06-19 (×4): qty 1

## 2011-06-19 MED ORDER — MORPHINE SULFATE 2 MG/ML IJ SOLN
1.0000 mg | INTRAMUSCULAR | Status: DC | PRN
  Administered 2011-06-19 – 2011-06-23 (×10): 1 mg via INTRAVENOUS
  Filled 2011-06-19 (×12): qty 1

## 2011-06-19 MED ORDER — ASPIRIN 81 MG PO TABS: 81.0000 mg | ORAL_TABLET | Freq: Every day | ORAL | Status: DC

## 2011-06-19 MED ORDER — COLCHICINE 0.6 MG PO TABS
0.6000 mg | ORAL_TABLET | Freq: Once | ORAL | Status: AC
  Administered 2011-06-19: 0.6 mg via ORAL
  Filled 2011-06-19: qty 1

## 2011-06-19 NOTE — Telephone Encounter (Signed)
Patients daughter sharon called requesting that we call patient to give her the lab results from yesterday. Sheila Morgan states that mom is experiencing more swelling since yesterday and is considering taking her on to the hospital.

## 2011-06-19 NOTE — ED Provider Notes (Signed)
History     CSN: [id number]  Arrival date & time 06/19/11  1147   First MD Initiated Contact with Patient 06/19/11 1246      Chief Complaint  Patient presents with  . Hand Pain  . Foot Pain    (Consider location/radiation/quality/duration/timing/severity/associated sxs/prior treatment) Patient is a 78 y.o. female presenting with hand pain and lower extremity pain. The history is provided by the patient.  Hand Pain  Foot Pain   patient here with pain as well and her right hand and left foot x2 weeks. Patient seen at her PCP and diagnosed with possible gout. Patient blood work drawn 2 days ago and was told to come to ED due to worsening pain and now inability to walk. There is questionable confusion. No fever, vomiting, diarrhea. No recent falls. Pain is worse with movement of her left ankle and and right wrist, made better with nothing to  Past Medical History  Diagnosis Date  . Hyperlipidemia   . Hypertension   . Cancer     skin  . Diabetes mellitus     2  . Arthritis   . Hiatal hernia   . Hemorrhoids   . Osteoporosis     Past Surgical History  Procedure Date  . Coronary artery bypass graft   . Coronary angioplasty with stent placement   . Total hip arthroplasty     Family History  Problem Relation Age of Onset  . Heart disease Father   . Cancer Sister     breast  . Diabetes Sister   . Heart disease Brother   . Heart disease Brother   . Heart disease Brother   . Diabetes Sister     History  Substance Use Topics  . Smoking status: Never Smoker   . Smokeless tobacco: Not on file  . Alcohol Use: No    OB History    Grav Para Term Preterm Abortions TAB SAB Ect Mult Living                  Review of Systems  All other systems reviewed and are negative.    Allergies  Review of patient's allergies indicates no known allergies.  Home Medications   Current Outpatient Rx  Name Route Sig Dispense Refill  . AMLODIPINE BESYLATE 5 MG PO TABS Oral  Take 1 tablet (5 mg total) by mouth daily. 30 tablet 2  . ASPIRIN 81 MG PO TABS Oral Take 81 mg by mouth daily.      Marland Kitchen CALCIUM 600-D PO Oral Take 1 tablet by mouth 2 (two) times daily.     Marland Kitchen CARVEDILOL 12.5 MG PO TABS Oral Take 12.5 mg by mouth 2 (two) times daily with a meal.    . FUROSEMIDE 40 MG PO TABS Oral Take 40 mg by mouth daily as needed. Taken with Potassium as needed for swelling.    . FUROSEMIDE 40 MG PO TABS Oral Take 40 mg by mouth daily. 1/2-1 by mouth daily as needed for swelling    . ISOSORBIDE MONONITRATE ER 30 MG PO TB24  TAKE  1/2 TABLET BY MOUTH DAILY 15 tablet 1  . LOSARTAN POTASSIUM 100 MG PO TABS Oral Take 1 tablet (100 mg total) by mouth daily. 90 tablet 1  . ADULT MULTIVITAMIN W/MINERALS CH Oral Take 1 tablet by mouth daily.    Marland Kitchen POTASSIUM CHLORIDE CRYS ER 20 MEQ PO TBCR  TAKE 1 TABLET BY MOUTH DAILY IF FUROSEMIDE IS TAKEN 30 tablet 1  .  SIMVASTATIN 20 MG PO TABS Oral Take 1 tablet (20 mg total) by mouth at bedtime. 90 tablet 0  . TRAMADOL HCL 50 MG PO TABS Oral Take 1 tablet (50 mg total) by mouth every 6 (six) hours as needed for pain. 30 tablet 0  . VITAMIN B-12 1000 MCG PO TABS Oral Take 1,000 mcg by mouth daily.      Letta Pate ULTRASOFT LANCETS MISC  CHECK BLOOD SUGAR TWICE A DAY. 100 each 4  . ONETOUCH ULTRA BLUE VI STRP  CHECK BLOOD SUGAR TWICE A DAY. 100 each 4    BP 131/58  Pulse 93  Temp(Src) 98.4 F (36.9 C) (Oral)  Resp 18  Wt 158 lb (71.668 kg)  SpO2 94%  Physical Exam  Nursing note and vitals reviewed. Constitutional: She is oriented to person, place, and time. She appears well-developed and well-nourished.  Non-toxic appearance. No distress.  HENT:  Head: Normocephalic and atraumatic.  Eyes: Conjunctivae, EOM and lids are normal. Pupils are equal, round, and reactive to light.  Neck: Normal range of motion. Neck supple. No tracheal deviation present. No mass present.  Cardiovascular: Normal rate, regular rhythm and normal heart sounds.  Exam  reveals no gallop.   No murmur heard. Pulmonary/Chest: Effort normal and breath sounds normal. No stridor. No respiratory distress. She has no decreased breath sounds. She has no wheezes. She has no rhonchi. She has no rales.  Abdominal: Soft. Normal appearance and bowel sounds are normal. She exhibits no distension. There is no tenderness. There is no rebound and no CVA tenderness.  Musculoskeletal: Normal range of motion. She exhibits no edema and no tenderness.       Right wrist: She exhibits swelling.       Left ankle: She exhibits swelling.  Neurological: She is alert and oriented to person, place, and time. She has normal strength. No cranial nerve deficit or sensory deficit. GCS eye subscore is 4. GCS verbal subscore is 5. GCS motor subscore is 6.  Skin: Skin is warm and dry. No abrasion and no rash noted.  Psychiatric: She has a normal mood and affect. Her speech is normal and behavior is normal.    ED Course  Procedures (including critical care time)   Labs Reviewed  CBC  DIFFERENTIAL  COMPREHENSIVE METABOLIC PANEL  SEDIMENTATION RATE  URINALYSIS, ROUTINE W REFLEX MICROSCOPIC  URIC ACID   No results found.   No diagnosis found.    MDM  Patient given IV fluids. We'll give her pain medication. Spoke with hospitalist and she'll be admitted        Toy Baker, MD 06/19/11 1555

## 2011-06-19 NOTE — ED Notes (Signed)
Review of lab work from MD appointment 3 days ago showed potassium level at 2.9, and elevated uric acid.

## 2011-06-19 NOTE — ED Notes (Signed)
Patient transported to X-ray on stretcher.   NAD noted.

## 2011-06-19 NOTE — H&P (Signed)
PCP:   Marga Melnick, MD, MD   Chief Complaint:  Wrist and ankle swelling and pain  HPI: 78 year old female who has had 2 week history of right wrist swelling and pain in over the last for 5 days he started having left ankle swelling and pain. She has a history of gout in the past and also has a history of polymyalgia rheumatica. She went to see her primary care physician several days ago at which time she was only having the wrist pain and swelling the uric acid level was sent off which was basically normal. The patient was placed on a low dose of tramadol without any colchicine. And now again she is starting to have the same issue in her ankle and is having very much difficulty ambulating or even getting up from a sitting position. She caught her PCP today and which point she was to come to the emergency department for admission. Her x-rays are negative. Her uric acid level is between 6 and 7. And her sedimentation rate is significantly elevated over 100. She denies any fevers. She says that she's had gouty arthritis in the past and in the same joints. She denies any rashes on her body. She denies any abdominal pain, nausea, vomiting, chest pain or shortness of breath. The tramadol that she's been given has had no effect on her pain and she has nothing stronger to take at home. She has not recently been on any steroids per patient report.  Review of Systems:  Otherwise negative  Past Medical History: Past Medical History  Diagnosis Date  . Hyperlipidemia   . Hypertension   . Cancer     skin  . Diabetes mellitus     2  . Arthritis   . Hiatal hernia   . Hemorrhoids   . Osteoporosis   . PONV (postoperative nausea and vomiting)    Past Surgical History  Procedure Date  . Coronary artery bypass graft   . Coronary angioplasty with stent placement   . Total hip arthroplasty     Medications: Prior to Admission medications   Medication Sig Start Date End Date Taking? Authorizing  Provider  amLODipine (NORVASC) 5 MG tablet Take 1 tablet (5 mg total) by mouth daily. 06/16/11 06/15/12 Yes Pecola Lawless, MD  aspirin 81 MG tablet Take 81 mg by mouth daily.     Yes Historical Provider, MD  Calcium Carbonate-Vitamin D (CALCIUM 600-D PO) Take 1 tablet by mouth 2 (two) times daily.    Yes Historical Provider, MD  carvedilol (COREG) 12.5 MG tablet Take 12.5 mg by mouth 2 (two) times daily with a meal.   Yes Historical Provider, MD  furosemide (LASIX) 40 MG tablet Take 40 mg by mouth daily as needed. Taken with Potassium as needed for swelling.   Yes Historical Provider, MD  furosemide (LASIX) 40 MG tablet Take 40 mg by mouth daily. 1/2-1 by mouth daily as needed for swelling 06/16/11 06/19/11 Yes Pecola Lawless, MD  isosorbide mononitrate (IMDUR) 30 MG 24 hr tablet TAKE  1/2 TABLET BY MOUTH DAILY 05/10/11  Yes Pecola Lawless, MD  losartan (COZAAR) 100 MG tablet Take 1 tablet (100 mg total) by mouth daily. 06/02/11  Yes Pecola Lawless, MD  Multiple Vitamin (MULITIVITAMIN WITH MINERALS) TABS Take 1 tablet by mouth daily.   Yes Historical Provider, MD  potassium chloride SA (K-DUR,KLOR-CON) 20 MEQ tablet TAKE 1 TABLET BY MOUTH DAILY IF FUROSEMIDE IS TAKEN 05/10/11  Yes Pecola Lawless,  MD  simvastatin (ZOCOR) 20 MG tablet Take 1 tablet (20 mg total) by mouth at bedtime. 06/02/11 06/01/12 Yes Pecola Lawless, MD  traMADol (ULTRAM) 50 MG tablet Take 1 tablet (50 mg total) by mouth every 6 (six) hours as needed for pain. 06/16/11 06/26/11 Yes Pecola Lawless, MD  vitamin B-12 (CYANOCOBALAMIN) 1000 MCG tablet Take 1,000 mcg by mouth daily.     Yes Historical Provider, MD  Lancets (ONETOUCH ULTRASOFT) lancets CHECK BLOOD SUGAR TWICE A DAY. 09/19/10   Pecola Lawless, MD  ONE TOUCH ULTRA TEST test strip CHECK BLOOD SUGAR TWICE A DAY. 09/19/10   Pecola Lawless, MD    Allergies:  No Known Allergies  Social History:  reports that she has never smoked. She has never used smokeless tobacco.  She reports that she does not drink alcohol or use illicit drugs.  Family History: Family History  Problem Relation Age of Onset  . Heart disease Father   . Cancer Sister     breast  . Diabetes Sister   . Heart disease Brother   . Heart disease Brother   . Heart disease Brother   . Diabetes Sister     Physical Exam: Filed Vitals:   06/19/11 1151 06/19/11 1749  BP: 131/58 157/60  Pulse: 93 99  Temp: 98.4 F (36.9 C) 100.5 F (38.1 C)  TempSrc: Oral Oral  Resp: 18   Weight: 71.668 kg (158 lb)   SpO2: 94% 95%   BP 157/60  Pulse 99  Temp(Src) 100.5 F (38.1 C) (Oral)  Resp 18  Wt 71.668 kg (158 lb)  SpO2 95% General appearance: alert, cooperative and no distress Lungs: clear to auscultation bilaterally Heart: regular rate and rhythm, S1, S2 normal, no murmur, click, rub or gallop Abdomen: soft, non-tender; bowel sounds normal; no masses,  no organomegaly Extremities: Right wrist is swollen and painful to movement also left ankle is swollen and painful bilateral lower extremities no clubbing or cyanosis pulses are intact Pulses: 2+ and symmetric Skin: Skin color, texture, turgor normal. No rashes or lesions Neurologic: Grossly normal    Labs on Admission:   [facility] 06/19/11 1433  NA 132*  K 3.0*  CL 89*  CO2 33*  GLUCOSE 145*  BUN 18  CREATININE 1.13*  CALCIUM 9.8  MG --  PHOS --    Basename 06/19/11 1433  AST 15  ALT 13  ALKPHOS 114  BILITOT 0.6  PROT 7.7  ALBUMIN 3.1*    Basename 06/19/11 1433  WBC 11.2*  NEUTROABS 9.1*  HGB 10.7*  HCT 33.0*  MCV 83.5  PLT 326    Radiological Exams on Admission: Dg Hand Complete Right  06/19/2011  *RADIOLOGY REPORT*  Clinical Data: Right hand pain.  RIGHT HAND - COMPLETE 3+ VIEW  Comparison: None  Findings: Degenerative changes are noted in the IP joints diffusely.  Early joint space narrowing within the MCP joints. There are moderate degenerative changes also in the first carpal metacarpal joint.  Most  pronounced degenerative changes in the DIP joints diffusely and fourth PIP joint. No acute bony abnormality. Specifically, no fracture, subluxation, or dislocation.  Soft tissues are intact.  IMPRESSION: Moderate to advanced degenerative changes as above. No acute bony abnormality.  Original Report Authenticated By: Cyndie Chime, M.D.   Dg Foot 2 Views Left  06/19/2011  *RADIOLOGY REPORT*  Clinical Data: Pain, left foot swelling for 2 weeks no known injury  LEFT FOOT - 2 VIEW  Comparison: 02/06/2011  Findings:  Two views of the left foot submitted.  No acute fracture or subluxation.  Dorsal soft tissue swelling is noted.  No periosteal reaction or bony erosion.  IMPRESSION: No acute fracture or subluxation.  Dorsal soft tissue swelling.  Original Report Authenticated By: Natasha Mead, M.D.    Assessment/Plan Present on Admission:  78 year old female with multiple joint swelling and pain likely due to gouty flare who is unable to ambulate due to uncontrolled pain  .Joint pain .Gout flare .Gait abnormality .DIABETES MELLITUS, TYPE II .Polymyalgia rheumatica  Going to increase her tramadol provide her with some morphine for severe breakthrough pain we'll hold off on any steroids at this point and see if colchicine alone will work. Her sedimentation rate will 2  be followed. She is afebrile and her white count is 11. We'll repeat her white count in the morning and if patient does not have significant improvement over the next 24 to 48 hours would consider orthopedic consultation for possible tapping of one of these joints.   Yohance Hathorne [phone] 06/19/2011, 6:21 PM

## 2011-06-19 NOTE — ED Notes (Signed)
[id number]<VW> Expected date:06/19/11<BR> Expected time:11:48 AM<BR> Means of arrival:Ambulance<BR> Comments:<BR> EMS 31 GC - gout

## 2011-06-19 NOTE — ED Notes (Signed)
5 east RN is in shift report at present.  Will send pt to TCU at this time.

## 2011-06-19 NOTE — Telephone Encounter (Signed)
[facility] Triage Call Report Triage Record Num: [id number] Operator: Caswell Corwin Patient Name: Sheila Morgan Call Date & Time: 06/19/2011 10:04:11AM Patient Phone: [phone] PCP: Marga Melnick Patient Gender: Female PCP Fax : [phone] Patient DOB: 1933/03/20 Practice Name: [facility] Reason for Call: Caller: Leroy/husband calling. PCP: Marga Melnick; CB#: [phone]; ; ; Call regarding Swelling in (R) Hand and (L) Foot; Also Needs Test Results; Pt was seen 23/12/13 by Dr. [name] for edema and pain and lab work done. He wants to put her in the hosp today. He cannot get pt up. Pt is confused. Triaged Confusionj, Disorientation, Aggitation and he cannot touch pt as this causes severe pain. Pt needs to be seen in the E/R and go by 911. Protocol(s) Used: Confusion, Disorientation, Agitation Recommended Outcome per Protocol: Activate EMS 911 Reason for Outcome: New or worsening neuro deficits AND new or worsening confusion, disorientation or agitation Care Advice: ~ Do not give the patient anything to eat or drink. Write down provider's name. List or place the following in a bag for transport with the patient: current prescription and/or nonprescription medications; alternative treatments, therapies and medications; and street drugs. ~

## 2011-06-19 NOTE — ED Notes (Signed)
Patient awake , family at bs.  Offered dinner to the patient and patient declined

## 2011-06-19 NOTE — ED Notes (Addendum)
Per EMS, pt in c/o right hand pain and swelling x2 weeks, also left foot pain and swelling x2 days, pt with history of gout, went to PMD for lab draw and pt was called and told to come to hospital due to results, unsure what result was abnormal

## 2011-06-19 NOTE — ED Notes (Signed)
Rn up stairs finally able to take report .  Patient tfrd to 1506

## 2011-06-20 ENCOUNTER — Encounter (HOSPITAL_COMMUNITY): Payer: Self-pay | Admitting: Orthopedic Surgery

## 2011-06-20 DIAGNOSIS — E119 Type 2 diabetes mellitus without complications: Secondary | ICD-10-CM | POA: Diagnosis not present

## 2011-06-20 DIAGNOSIS — R269 Unspecified abnormalities of gait and mobility: Secondary | ICD-10-CM | POA: Diagnosis not present

## 2011-06-20 DIAGNOSIS — M2550 Pain in unspecified joint: Secondary | ICD-10-CM | POA: Diagnosis not present

## 2011-06-20 DIAGNOSIS — E876 Hypokalemia: Secondary | ICD-10-CM | POA: Diagnosis present

## 2011-06-20 DIAGNOSIS — M109 Gout, unspecified: Secondary | ICD-10-CM | POA: Diagnosis not present

## 2011-06-20 DIAGNOSIS — N289 Disorder of kidney and ureter, unspecified: Secondary | ICD-10-CM | POA: Diagnosis present

## 2011-06-20 LAB — COMPREHENSIVE METABOLIC PANEL
AST: 11 U/L (ref 0–37)
Albumin: 2.6 g/dL — ABNORMAL LOW (ref 3.5–5.2)
Alkaline Phosphatase: 111 U/L (ref 39–117)
BUN: 13 mg/dL (ref 6–23)
CO2: 30 mEq/L (ref 19–32)
Chloride: 95 mEq/L — ABNORMAL LOW (ref 96–112)
GFR calc non Af Amer: 50 mL/min — ABNORMAL LOW (ref >90)
Potassium: 3.4 mEq/L — ABNORMAL LOW (ref 3.5–5.1)
Total Bilirubin: 1.4 mg/dL — ABNORMAL HIGH (ref 0.3–1.2)

## 2011-06-20 LAB — GLUCOSE, CAPILLARY

## 2011-06-20 LAB — CBC
MCH: 27.8 pg (ref 26.0–34.0)
MCV: 83.5 fL (ref 78.0–100.0)
Platelets: 279 10*3/uL (ref 150–400)
RDW: 14.7 % (ref 11.5–15.5)

## 2011-06-20 MED ORDER — PIPERACILLIN-TAZOBACTAM 3.375 G IVPB
3.3750 g | Freq: Three times a day (TID) | INTRAVENOUS | Status: DC
  Administered 2011-06-21 – 2011-06-23 (×6): 3.375 g via INTRAVENOUS
  Filled 2011-06-20 (×11): qty 50

## 2011-06-20 MED ORDER — MAGNESIUM SULFATE 40 MG/ML IJ SOLN
2.0000 g | Freq: Once | INTRAMUSCULAR | Status: AC
  Administered 2011-06-20: 2 g via INTRAVENOUS
  Filled 2011-06-20: qty 50

## 2011-06-20 MED ORDER — POTASSIUM CHLORIDE CRYS ER 20 MEQ PO TBCR
40.0000 meq | EXTENDED_RELEASE_TABLET | Freq: Once | ORAL | Status: AC
  Administered 2011-06-20: 40 meq via ORAL
  Filled 2011-06-20: qty 2

## 2011-06-20 MED ORDER — POTASSIUM CHLORIDE CRYS ER 20 MEQ PO TBCR
40.0000 meq | EXTENDED_RELEASE_TABLET | Freq: Every day | ORAL | Status: DC
  Administered 2011-06-20 – 2011-06-25 (×6): 40 meq via ORAL
  Filled 2011-06-20 (×7): qty 2

## 2011-06-20 MED ORDER — VANCOMYCIN HCL 1000 MG IV SOLR
750.0000 mg | Freq: Two times a day (BID) | INTRAVENOUS | Status: DC
  Administered 2011-06-20 – 2011-06-22 (×5): 750 mg via INTRAVENOUS
  Filled 2011-06-20 (×8): qty 750

## 2011-06-20 MED ORDER — COLCHICINE 0.6 MG PO TABS
0.6000 mg | ORAL_TABLET | Freq: Two times a day (BID) | ORAL | Status: DC
  Administered 2011-06-21 – 2011-06-25 (×9): 0.6 mg via ORAL
  Filled 2011-06-20 (×12): qty 1

## 2011-06-20 MED ORDER — LIDOCAINE HCL (PF) 1 % IJ SOLN
50.0000 mL | Freq: Once | INTRAMUSCULAR | Status: DC
  Filled 2011-06-20: qty 60

## 2011-06-20 MED ORDER — INSULIN ASPART 100 UNIT/ML ~~LOC~~ SOLN
0.0000 [IU] | SUBCUTANEOUS | Status: DC
  Administered 2011-06-20: 2 [IU] via SUBCUTANEOUS
  Administered 2011-06-21: 3 [IU] via SUBCUTANEOUS
  Filled 2011-06-20: qty 3

## 2011-06-20 NOTE — Progress Notes (Signed)
ANTIBIOTIC CONSULT NOTE - INITIAL  Pharmacy Consult for: Vancomycin/Zosyn Indication:  Septic joint  No Known Allergies  Patient Measurements: Height: 5\' 7"  (170.2 cm) Weight: 158 lb (71.668 kg) IBW/kg (Calculated) : 61.6    Vital Signs: Temp: 99.5 F (37.5 C) (02/16 1026) Temp src: Oral (02/16 1026) BP: 135/69 mmHg (02/16 1026) Pulse Rate: 80  (02/16 1026) Intake/Output from previous day: 02/15 0701 - 02/16 0700 In: 510 [I.V.:510] Out: 650 [Urine:650] Intake/Output from this shift:    Labs:  [facility] 06/20/11 0616 06/19/11 1433  WBC 13.3* 11.2*  HGB 9.6* 10.7*  PLT 279 326  LABCREA -- --  CREATININE 1.05 1.13*   Estimated Creatinine Clearance: 44.3 ml/min (by C-G formula based on Cr of 1.05). No results found for this basename: VANCOTROUGH:2,VANCOPEAK:2,VANCORANDOM:2,GENTTROUGH:2,GENTPEAK:2,GENTRANDOM:2,TOBRATROUGH:2,TOBRAPEAK:2,TOBRARND:2,AMIKACINPEAK:2,AMIKACINTROU:2,AMIKACIN:2, in the last 72 hours   Microbiology: No results found for this or any previous visit (from the past 720 hour(s)).  Medical History: Past Medical History  Diagnosis Date  . Hyperlipidemia   . Hypertension   . Cancer     skin  . Diabetes mellitus     2  . Arthritis   . Hiatal hernia   . Hemorrhoids   . Osteoporosis   . PONV (postoperative nausea and vomiting)     Medications:  Scheduled:    . amLODipine  5 mg Oral Daily  . aspirin  81 mg Oral Daily  . carvedilol  12.5 mg Oral BID WC  . colchicine  0.6 mg Oral Once  . enoxaparin  40 mg Subcutaneous Q24H  . furosemide  40 mg Oral Daily  . insulin aspart  0-9 Units Subcutaneous Q4H  . isosorbide mononitrate  30 mg Oral Daily  . losartan  100 mg Oral Daily  . magnesium sulfate 1 - 4 g bolus IVPB  2 g Intravenous Once  . mulitivitamin with minerals  1 tablet Oral Daily  . piperacillin-tazobactam (ZOSYN)  IV  3.375 g Intravenous Q8H  . potassium chloride SA  20 mEq Oral Daily  . potassium chloride  40 mEq Oral Once  .  potassium chloride  40 mEq Oral Daily  . simvastatin  20 mg Oral QHS  . vancomycin  750 mg Intravenous Q12H  . vitamin B-12  1,000 mcg Oral Daily  . DISCONTD: aspirin  81 mg Oral Daily   Infusions:    . 0.9 % NaCl with KCl 20 mEq / L 75 mL/hr at 06/19/11 2312   PRN: colchicine, morphine, traMADol Assessment: 78 yo F with septic joint CrCl 44 ml/min; weight 72 kg Temp 102.8  Goal of Therapy:  Vancomycin trough level 15-20 mcg/ml  Plan:  1.) Zosyn 3.375 gm IV q8h, infuse over 4 hours 2.) Vancomycin 750 mg IV q12h 3.) Monitor renal function 4.) Follow any cultures   Ariell Gunnels, Loma Messing PharmD 6:40 PM 06/20/2011

## 2011-06-20 NOTE — Progress Notes (Signed)
Patient ID: Sheila Morgan, female   DOB: 12-Jul-1932, 78 y.o.   MRN: [id number]  Subjective: No events overnight. Patient denies chest pain, shortness of breath, abdominal pain.   Objective:  Vital signs in last 24 hours:  Filed Vitals:   06/20/11 0135 06/20/11 0645 06/20/11 1026 06/20/11 1127  BP: 137/69 133/66 135/69   Pulse: 92 93 80   Temp: 98.8 F (37.1 C) 99.5 F (37.5 C) 99.5 F (37.5 C)   TempSrc: Oral Oral Oral   Resp: 20 20 20    Height:    5\' 7"  (1.702 m)  Weight:    71.668 kg (158 lb)  SpO2: 94% 93% 93%     Intake/Output from previous day:   Intake/Output Summary (Last 24 hours) at 06/20/11 1751 Last data filed at 06/20/11 0700  Gross per 24 hour  Intake    510 ml  Output    650 ml  Net   -140 ml    Physical Exam: General: Alert, awake, oriented x3, in no acute distress. HEENT: No bruits, no goiter. Moist mucous membranes, no scleral icterus, no conjunctival pallor. Heart: Regular rate and rhythm, S1/S2 +, no murmurs, rubs, gallops. Lungs: Clear to auscultation bilaterally. No wheezing, no rhonchi, no rales.  Abdomen: Soft, nontender, nondistended, positive bowel sounds. Extremities: No clubbing or cyanosis. Right wrist swollen and tender to palpation, warm to touch, 4th right finger joint swelling Neuro: Grossly nonfocal.  Lab Results:  Basic Metabolic Panel:    Component Value Date/Time   NA 134* 06/20/2011 0616   K 3.4* 06/20/2011 0616   CL 95* 06/20/2011 0616   CO2 30 06/20/2011 0616   BUN 13 06/20/2011 0616   CREATININE 1.05 06/20/2011 0616   GLUCOSE 114* 06/20/2011 0616   CALCIUM 9.0 06/20/2011 0616   CBC:    Component Value Date/Time   WBC 13.3* 06/20/2011 0616   HGB 9.6* 06/20/2011 0616   HCT 28.8* 06/20/2011 0616   PLT 279 06/20/2011 0616   MCV 83.5 06/20/2011 0616   NEUTROABS 9.1* 06/19/2011 1433   LYMPHSABS 0.6* 06/19/2011 1433   MONOABS 1.4* 06/19/2011 1433   EOSABS 0.0 06/19/2011 1433   BASOSABS 0.0 06/19/2011 1433      Lab 06/20/11 0616  06/19/11 1433  WBC 13.3* 11.2*  HGB 9.6* 10.7*  HCT 28.8* 33.0*  PLT 279 326  MCV 83.5 83.5  MCH 27.8 27.1  MCHC 33.3 32.4  RDW 14.7 14.5  LYMPHSABS -- 0.6*  MONOABS -- 1.4*  EOSABS -- 0.0  BASOSABS -- 0.0  BANDABS -- --    Lab 06/20/11 0616 06/19/11 1433 06/16/11 1233  NA 134* 132* 137  K 3.4* 3.0* 2.9*  CL 95* 89* 92*  CO2 30 33* 32  GLUCOSE 114* 145* 97  BUN 13 18 14   CREATININE 1.05 1.13* 1.0  CALCIUM 9.0 9.8 9.8  MG 1.4* -- --   Studies/Results: Dg Hand Complete Right  06/19/2011  *RADIOLOGY REPORT*  Clinical Data: Right hand pain.  RIGHT HAND - COMPLETE 3+ VIEW  Comparison: None  Findings: Degenerative changes are noted in the IP joints diffusely.  Early joint space narrowing within the MCP joints. There are moderate degenerative changes also in the first carpal metacarpal joint.  Most pronounced degenerative changes in the DIP joints diffusely and fourth PIP joint. No acute bony abnormality. Specifically, no fracture, subluxation, or dislocation.  Soft tissues are intact.  IMPRESSION: Moderate to advanced degenerative changes as above. No acute bony abnormality.  Original Report Authenticated By:  Cyndie Chime, M.D.   Dg Foot 2 Views Left  06/19/2011  *RADIOLOGY REPORT*  Clinical Data: Pain, left foot swelling for 2 weeks no known injury  LEFT FOOT - 2 VIEW  Comparison: 02/06/2011  Findings: Two views of the left foot submitted.  No acute fracture or subluxation.  Dorsal soft tissue swelling is noted.  No periosteal reaction or bony erosion.  IMPRESSION: No acute fracture or subluxation.  Dorsal soft tissue swelling.  Original Report Authenticated By: Natasha Mead, M.D.    Medications: Scheduled Meds:   . amLODipine  5 mg Oral Daily  . aspirin  81 mg Oral Daily  . carvedilol  12.5 mg Oral BID WC  . colchicine  0.6 mg Oral Once  . enoxaparin  40 mg Subcutaneous Q24H  . furosemide  40 mg Oral Daily  . isosorbide mononitrate  30 mg Oral Daily  . losartan  100 mg Oral  Daily  . mulitivitamin with minerals  1 tablet Oral Daily  . potassium chloride SA  20 mEq Oral Daily  . potassium chloride  40 mEq Oral Once  . simvastatin  20 mg Oral QHS  . vitamin B-12  1,000 mcg Oral Daily  . DISCONTD: aspirin  81 mg Oral Daily   Continuous Infusions:   . 0.9 % NaCl with KCl 20 mEq / L 75 mL/hr at 06/19/11 2312   PRN Meds:.colchicine, morphine, traMADol  Assessment/Plan:  Principal Problem:  *Joint pain - unclear etiology, not sure that this is related to gout, uric acid is WNL and per PCP multiple previous aspirations were negative for gout - now ESR is > 100, pt continues to spike fever > 102 F, with elevation in WBC this is certainly worrisome for an infectious etiology - will call ortho for further recommendations and joint aspiration with analysis if indicated - will start broad spectrum abx for now and will taper down based on the results  Active Problems:  DIABETES MELLITUS, TYPE II - check A1C and continue SSI sensitive coverage for now   UNSPECIFIED ANEMIA - Hg/Hct remains stable between 9-10   Polymyalgia rheumatica - pt has history of PMR, not on any treatment   ARF (acute renal failure) - likely prerenal etiology - now resolved   Hypokalemia - continue to supplement   Hypomagnesemia - continue to supplement   HYPERTENSION - remains stable   EDUCATION - test results and diagnostic studies were discussed with patient and pt's family who was present at the bedside - patient and family have verbalized the understanding - questions were answered at the bedside and contact information was provided for additional questions or concerns   LOS: 1 day   [facility] 06/20/2011, 5:51 PM  [facility] Pager: [phone]

## 2011-06-20 NOTE — Consult Note (Signed)
Sheila Morgan is an 78 y.o. female.   Chief Complaint: right wrist pain HPI: 78 yo lhd female seen in hospital room.  Daughter present.  States she has had right wrist, ring pip,  and left foot pain x 2 weeks.  History of gout in left foot treated with colcrys in October 2012.  Resolved.  2 weeks ago began to have wrist pain.  No trauma or wounds or illness.  Has pain with use of hand.  Daughter states that right wrist looks less swollen and red than this morning.  Patient thinks it is improving slightly.  Still has pain in left foot and ankle.  Past Medical History  Diagnosis Date  . Hyperlipidemia   . Hypertension   . Cancer     skin  . Diabetes mellitus     2  . Arthritis   . Hiatal hernia   . Hemorrhoids   . Osteoporosis   . PONV (postoperative nausea and vomiting)     Past Surgical History  Procedure Date  . Coronary artery bypass graft   . Coronary angioplasty with stent placement   . Total hip arthroplasty     Family History  Problem Relation Age of Onset  . Heart disease Father   . Cancer Sister     breast  . Diabetes Sister   . Heart disease Brother   . Heart disease Brother   . Heart disease Brother   . Diabetes Sister    Social History:  reports that she has never smoked. She has never used smokeless tobacco. She reports that she does not drink alcohol or use illicit drugs.  Allergies: No Known Allergies  Medications Prior to Admission  Medication Dose Route Frequency Provider Last Rate Last Dose  . 0.9 %  sodium chloride infusion   Intravenous Once Toy Baker, MD 125 mL/hr at 06/19/11 1600    . 0.9 % NaCl with KCl 20 mEq/ L  infusion   Intravenous Continuous Haydee Monica, MD 75 mL/hr at 06/19/11 2312    . amLODipine (NORVASC) tablet 5 mg  5 mg Oral Daily Haydee Monica, MD   5 mg at 06/20/11 0957  . aspirin chewable tablet 81 mg  81 mg Oral Daily Ramiro Harvest, MD   81 mg at 06/20/11 0957  . carvedilol (COREG) tablet 12.5 mg  12.5 mg Oral BID WC  Haydee Monica, MD   12.5 mg at 06/20/11 1924  . colchicine tablet 0.6 mg  0.6 mg Oral Q1H PRN Haydee Monica, MD   0.6 mg at 06/20/11 1445  . colchicine tablet 0.6 mg  0.6 mg Oral Once Haydee Monica, MD   0.6 mg at 06/19/11 2230  . enoxaparin (LOVENOX) injection 40 mg  40 mg Subcutaneous Q24H Haydee Monica, MD   40 mg at 06/19/11 2230  . furosemide (LASIX) tablet 40 mg  40 mg Oral Daily Haydee Monica, MD   40 mg at 06/20/11 0957  . insulin aspart (novoLOG) injection 0-9 Units  0-9 Units Subcutaneous Q4H Iskra Magick-Myers, MD      . isosorbide mononitrate (IMDUR) 24 hr tablet 30 mg  30 mg Oral Daily Haydee Monica, MD   30 mg at 06/20/11 0956  . lidocaine (XYLOCAINE) 1 % injection 50 mL  50 mL Other Once Nicki Reaper, MD      . losartan (COZAAR) tablet 100 mg  100 mg Oral Daily Haydee Monica, MD   100  mg at 06/20/11 0956  . magnesium sulfate IVPB 2 g 50 mL  2 g Intravenous Once Iskra Magick-Myers, MD      . morphine 2 MG/ML injection 1 mg  1 mg Intravenous Q4H PRN Haydee Monica, MD   1 mg at 06/20/11 1712  . morphine 2 MG/ML injection 2 mg  2 mg Intravenous Once Toy Baker, MD   2 mg at 06/19/11 1606  . mulitivitamin with minerals tablet 1 tablet  1 tablet Oral Daily Haydee Monica, MD   1 tablet at 06/20/11 [phone]  . piperacillin-tazobactam (ZOSYN) IVPB 3.375 g  3.375 g Intravenous Q8H Loma Messing [place], PHARMD      . potassium chloride SA (K-DUR,KLOR-CON) CR tablet 20 mEq  20 mEq Oral Daily Haydee Monica, MD   20 mEq at 06/20/11 1001  . potassium chloride SA (K-DUR,KLOR-CON) CR tablet 40 mEq  40 mEq Oral Once Toy Baker, MD   40 mEq at 06/19/11 1559  . potassium chloride SA (K-DUR,KLOR-CON) CR tablet 40 mEq  40 mEq Oral Once Ramiro Harvest, MD   40 mEq at 06/20/11 0811  . potassium chloride SA (K-DUR,KLOR-CON) CR tablet 40 mEq  40 mEq Oral Daily Iskra Magick-Myers, MD   40 mEq at 06/20/11 1926  . simvastatin (ZOCOR) tablet 20 mg  20 mg Oral QHS Haydee Monica, MD   20 mg at  06/19/11 2230  . traMADol (ULTRAM) tablet 100 mg  100 mg Oral Q6H PRN Haydee Monica, MD   100 mg at 06/19/11 2317  . vancomycin (VANCOCIN) 750 mg in sodium chloride 0.9 % 150 mL IVPB  750 mg Intravenous Q12H Loma Messing [place], PHARMD   750 mg at 06/20/11 1924  . vitamin B-12 (CYANOCOBALAMIN) tablet 1,000 mcg  1,000 mcg Oral Daily Haydee Monica, MD   1,000 mcg at 06/20/11 0957  . DISCONTD: aspirin tablet 81 mg  81 mg Oral Daily Haydee Monica, MD       Medications Prior to Admission  Medication Sig Dispense Refill  . amLODipine (NORVASC) 5 MG tablet Take 1 tablet (5 mg total) by mouth daily.  30 tablet  2  . aspirin 81 MG tablet Take 81 mg by mouth daily.        . Calcium Carbonate-Vitamin D (CALCIUM 600-D PO) Take 1 tablet by mouth 2 (two) times daily.       . furosemide (LASIX) 40 MG tablet Take 40 mg by mouth daily. 1/2-1 by mouth daily as needed for swelling      . isosorbide mononitrate (IMDUR) 30 MG 24 hr tablet TAKE  1/2 TABLET BY MOUTH DAILY  15 tablet  1  . losartan (COZAAR) 100 MG tablet Take 1 tablet (100 mg total) by mouth daily.  90 tablet  1  . potassium chloride SA (K-DUR,KLOR-CON) 20 MEQ tablet TAKE 1 TABLET BY MOUTH DAILY IF FUROSEMIDE IS TAKEN  30 tablet  1  . simvastatin (ZOCOR) 20 MG tablet Take 1 tablet (20 mg total) by mouth at bedtime.  90 tablet  0  . traMADol (ULTRAM) 50 MG tablet Take 1 tablet (50 mg total) by mouth every 6 (six) hours as needed for pain.  30 tablet  0  . vitamin B-12 (CYANOCOBALAMIN) 1000 MCG tablet Take 1,000 mcg by mouth daily.        . Lancets (ONETOUCH ULTRASOFT) lancets CHECK BLOOD SUGAR TWICE A DAY.  100 each  4  . ONE TOUCH ULTRA TEST test  strip CHECK BLOOD SUGAR TWICE A DAY.  100 each  4    Results for orders placed during the hospital encounter of 06/19/11 (from the past 48 hour(s))  URINALYSIS, ROUTINE W REFLEX MICROSCOPIC     Status: Abnormal   Collection Time   06/19/11  1:31 PM      Component Value Range Comment   Color, Urine  YELLOW  YELLOW     APPearance CLEAR  CLEAR     Specific Gravity, Urine 1.014  1.005 - 1.030     pH 6.5  5.0 - 8.0     Glucose, UA NEGATIVE  NEGATIVE (mg/dL)    Hgb urine dipstick SMALL (*) NEGATIVE     Bilirubin Urine NEGATIVE  NEGATIVE     Ketones, ur NEGATIVE  NEGATIVE (mg/dL)    Protein, ur NEGATIVE  NEGATIVE (mg/dL)    Urobilinogen, UA 0.2  0.0 - 1.0 (mg/dL)    Nitrite NEGATIVE  NEGATIVE     Leukocytes, UA TRACE (*) NEGATIVE    URINE MICROSCOPIC-ADD ON     Status: Abnormal   Collection Time   06/19/11  1:31 PM      Component Value Range Comment   Squamous Epithelial / LPF FEW (*) RARE     WBC, UA 0-2  <3 (WBC/hpf)    RBC / HPF 3-6  <3 (RBC/hpf)   CBC     Status: Abnormal   Collection Time   06/19/11  2:33 PM      Component Value Range Comment   WBC 11.2 (*) 4.0 - 10.5 (K/uL)    RBC 3.95  3.87 - 5.11 (MIL/uL)    Hemoglobin 10.7 (*) 12.0 - 15.0 (g/dL)    HCT 16.1 (*) 09.6 - 46.0 (%)    MCV 83.5  78.0 - 100.0 (fL)    MCH 27.1  26.0 - 34.0 (pg)    MCHC 32.4  30.0 - 36.0 (g/dL)    RDW 04.5  40.9 - 81.1 (%)    Platelets 326  150 - 400 (K/uL)   DIFFERENTIAL     Status: Abnormal   Collection Time   06/19/11  2:33 PM      Component Value Range Comment   Neutrophils Relative 82 (*) 43 - 77 (%)    Neutro Abs 9.1 (*) 1.7 - 7.7 (K/uL)    Lymphocytes Relative 6 (*) 12 - 46 (%)    Lymphs Abs 0.6 (*) 0.7 - 4.0 (K/uL)    Monocytes Relative 12  3 - 12 (%)    Monocytes Absolute 1.4 (*) 0.1 - 1.0 (K/uL)    Eosinophils Relative 0  0 - 5 (%)    Eosinophils Absolute 0.0  0.0 - 0.7 (K/uL)    Basophils Relative 0  0 - 1 (%)    Basophils Absolute 0.0  0.0 - 0.1 (K/uL)   COMPREHENSIVE METABOLIC PANEL     Status: Abnormal   Collection Time   06/19/11  2:33 PM      Component Value Range Comment   Sodium 132 (*) 135 - 145 (mEq/L)    Potassium 3.0 (*) 3.5 - 5.1 (mEq/L)    Chloride 89 (*) 96 - 112 (mEq/L)    CO2 33 (*) 19 - 32 (mEq/L)    Glucose, Bld 145 (*) 70 - 99 (mg/dL)    BUN 18  6 - 23  (mg/dL)    Creatinine, Ser 9.14 (*) 0.50 - 1.10 (mg/dL)    Calcium 9.8  8.4 - 10.5 (mg/dL)  Total Protein 7.7  6.0 - 8.3 (g/dL)    Albumin 3.1 (*) 3.5 - 5.2 (g/dL)    AST 15  0 - 37 (U/L)    ALT 13  0 - 35 (U/L)    Alkaline Phosphatase 114  39 - 117 (U/L)    Total Bilirubin 0.6  0.3 - 1.2 (mg/dL)    GFR calc non Af Amer 46 (*) >90 (mL/min)    GFR calc Af Amer 53 (*) >90 (mL/min)   SEDIMENTATION RATE     Status: Abnormal   Collection Time   06/19/11  2:33 PM      Component Value Range Comment   Sed Rate 113 (*) 0 - 22 (mm/hr)   URIC ACID     Status: Normal   Collection Time   06/19/11  2:33 PM      Component Value Range Comment   Uric Acid, Serum 6.0  2.4 - 7.0 (mg/dL)   COMPREHENSIVE METABOLIC PANEL     Status: Abnormal   Collection Time   06/20/11  6:16 AM      Component Value Range Comment   Sodium 134 (*) 135 - 145 (mEq/L)    Potassium 3.4 (*) 3.5 - 5.1 (mEq/L)    Chloride 95 (*) 96 - 112 (mEq/L)    CO2 30  19 - 32 (mEq/L)    Glucose, Bld 114 (*) 70 - 99 (mg/dL)    BUN 13  6 - 23 (mg/dL)    Creatinine, Ser 7.82  0.50 - 1.10 (mg/dL)    Calcium 9.0  8.4 - 10.5 (mg/dL)    Total Protein 6.8  6.0 - 8.3 (g/dL)    Albumin 2.6 (*) 3.5 - 5.2 (g/dL)    AST 11  0 - 37 (U/L)    ALT 10  0 - 35 (U/L)    Alkaline Phosphatase 111  39 - 117 (U/L)    Total Bilirubin 1.4 (*) 0.3 - 1.2 (mg/dL)    GFR calc non Af Amer 50 (*) >90 (mL/min)    GFR calc Af Amer 58 (*) >90 (mL/min)   CBC     Status: Abnormal   Collection Time   06/20/11  6:16 AM      Component Value Range Comment   WBC 13.3 (*) 4.0 - 10.5 (K/uL)    RBC 3.45 (*) 3.87 - 5.11 (MIL/uL)    Hemoglobin 9.6 (*) 12.0 - 15.0 (g/dL)    HCT 95.6 (*) 21.3 - 46.0 (%)    MCV 83.5  78.0 - 100.0 (fL)    MCH 27.8  26.0 - 34.0 (pg)    MCHC 33.3  30.0 - 36.0 (g/dL)    RDW 08.6  57.8 - 46.9 (%)    Platelets 279  150 - 400 (K/uL)   MAGNESIUM     Status: Abnormal   Collection Time   06/20/11  6:16 AM      Component Value Range Comment    Magnesium 1.4 (*) 1.5 - 2.5 (mg/dL)   GLUCOSE, CAPILLARY     Status: Abnormal   Collection Time   06/20/11  8:17 PM      Component Value Range Comment   Glucose-Capillary 171 (*) 70 - 99 (mg/dL)    Comment 1 Notify RN       Dg Hand Complete Right  06/19/2011  *RADIOLOGY REPORT*  Clinical Data: Right hand pain.  RIGHT HAND - COMPLETE 3+ VIEW  Comparison: None  Findings: Degenerative changes are noted in the IP joints  diffusely.  Early joint space narrowing within the MCP joints. There are moderate degenerative changes also in the first carpal metacarpal joint.  Most pronounced degenerative changes in the DIP joints diffusely and fourth PIP joint. No acute bony abnormality. Specifically, no fracture, subluxation, or dislocation.  Soft tissues are intact.  IMPRESSION: Moderate to advanced degenerative changes as above. No acute bony abnormality.  Original Report Authenticated By: Cyndie Chime, M.D.   Dg Foot 2 Views Left  06/19/2011  *RADIOLOGY REPORT*  Clinical Data: Pain, left foot swelling for 2 weeks no known injury  LEFT FOOT - 2 VIEW  Comparison: 02/06/2011  Findings: Two views of the left foot submitted.  No acute fracture or subluxation.  Dorsal soft tissue swelling is noted.  No periosteal reaction or bony erosion.  IMPRESSION: No acute fracture or subluxation.  Dorsal soft tissue swelling.  Original Report Authenticated By: Natasha Mead, M.D.     A comprehensive review of systems was negative except for: Constitutional: positive for fevers  Blood pressure 135/69, pulse 80, temperature 99.5 F (37.5 C), temperature source Oral, resp. rate 20, height 5\' 7"  (1.702 m), weight 71.668 kg (158 lb), SpO2 93.00%.  General appearance: alert, cooperative, appears stated age and no distress Head: Normocephalic, without obvious abnormality, atraumatic Neck: supple, symmetrical, trachea midline Extremities: upper extremities intact to light touch sensation and capillary refill in all digits.   +epl/fpl/io.  no wounds.  no ttp left upper extremity.  right upper extremity without wounds.  ttp at wrist and ring pip joint.  pip joint enlarged and mildly erythematous.  wrist with swelling, minimal erythema.  warm.  some pain with motion.  no streaking up arm.  left ankle/foot swollen and pain with motion.  No fluctuance.  No signs or symptoms of dystrophy. Pulses: 2+ and symmetric Skin: as above Neurologic: Grossly normal Incision/Wound: na  Assessment/Plan Right wrist arthritis and gout.  Previous history of gout and uric acid 8.4 in October.  Current uric acid is 6.  Fever last pm, afebrile now.  Increased temp, esr, wbc can all occur with gout.  colcrys has been ordered but just a single dose then prn.  Patient has had some improvement.  Xrays show degenerative changes with chondrocalcinosis notable at tfcc and volar wrist.  No changes attributable to infection of 2 weeks duration.  Significant osteophytes at pip joint of ring finger account for its large size and pain.  Left foot swollen and painful.  Needs general orthopaedic consult.  Discussed with covering hospitalist.    Procedure: Right wrist aspiration.  Skin anesthetized with 2cc 1% plain lidocaine.  Prepped with betadine and sterilely draped.  Radiocarpal joint aspirated.  Small amount (into needle hub only) of blood and no fluid obtained.  Would expect significant purulence in infection.  Liviana Mills R 06/20/2011, 8:21 PM

## 2011-06-21 ENCOUNTER — Observation Stay (HOSPITAL_COMMUNITY): Payer: Medicare Other

## 2011-06-21 DIAGNOSIS — M25569 Pain in unspecified knee: Secondary | ICD-10-CM | POA: Diagnosis not present

## 2011-06-21 DIAGNOSIS — M25469 Effusion, unspecified knee: Secondary | ICD-10-CM | POA: Diagnosis not present

## 2011-06-21 DIAGNOSIS — M25869 Other specified joint disorders, unspecified knee: Secondary | ICD-10-CM | POA: Diagnosis not present

## 2011-06-21 DIAGNOSIS — IMO0002 Reserved for concepts with insufficient information to code with codable children: Secondary | ICD-10-CM | POA: Diagnosis not present

## 2011-06-21 DIAGNOSIS — M109 Gout, unspecified: Secondary | ICD-10-CM | POA: Diagnosis not present

## 2011-06-21 DIAGNOSIS — R269 Unspecified abnormalities of gait and mobility: Secondary | ICD-10-CM | POA: Diagnosis not present

## 2011-06-21 DIAGNOSIS — E119 Type 2 diabetes mellitus without complications: Secondary | ICD-10-CM | POA: Diagnosis not present

## 2011-06-21 DIAGNOSIS — M2550 Pain in unspecified joint: Secondary | ICD-10-CM | POA: Diagnosis not present

## 2011-06-21 DIAGNOSIS — M1710 Unilateral primary osteoarthritis, unspecified knee: Secondary | ICD-10-CM | POA: Diagnosis not present

## 2011-06-21 LAB — CBC
MCH: 27 pg (ref 26.0–34.0)
MCHC: 32.5 g/dL (ref 30.0–36.0)
Platelets: 293 10*3/uL (ref 150–400)
RDW: 14.7 % (ref 11.5–15.5)

## 2011-06-21 LAB — BASIC METABOLIC PANEL
Calcium: 8.6 mg/dL (ref 8.4–10.5)
GFR calc Af Amer: 58 mL/min — ABNORMAL LOW (ref >90)
GFR calc non Af Amer: 50 mL/min — ABNORMAL LOW (ref >90)
Glucose, Bld: 103 mg/dL — ABNORMAL HIGH (ref 70–99)
Sodium: 130 mEq/L — ABNORMAL LOW (ref 135–145)

## 2011-06-21 LAB — IRON AND TIBC
Iron: 14 ug/dL — ABNORMAL LOW (ref 42–135)
Saturation Ratios: 9 % — ABNORMAL LOW (ref 20–55)
TIBC: 152 ug/dL — ABNORMAL LOW (ref 250–470)
UIBC: 138 ug/dL (ref 125–400)

## 2011-06-21 LAB — GLUCOSE, CAPILLARY
Glucose-Capillary: 112 mg/dL — ABNORMAL HIGH (ref 70–99)
Glucose-Capillary: 107 mg/dL — ABNORMAL HIGH (ref 70–99)
Glucose-Capillary: 110 mg/dL — ABNORMAL HIGH (ref 70–99)
Glucose-Capillary: 203 mg/dL — ABNORMAL HIGH (ref 70–99)

## 2011-06-21 LAB — FOLATE: Folate: >20 ng/mL

## 2011-06-21 LAB — HEMOGLOBIN A1C
Hgb A1c MFr Bld: 6.1 % — ABNORMAL HIGH
Mean Plasma Glucose: 128 mg/dL — ABNORMAL HIGH

## 2011-06-21 LAB — GRAM STAIN

## 2011-06-21 LAB — SYNOVIAL CELL COUNT + DIFF, W/ CRYSTALS
Lymphocytes-Synovial Fld: 4 % (ref 0–20)
Neutrophil, Synovial: 93 % — ABNORMAL HIGH (ref 0–25)

## 2011-06-21 MED ORDER — LIDOCAINE HCL 1 % IJ SOLN
INTRAMUSCULAR | Status: AC
  Filled 2011-06-21: qty 20

## 2011-06-21 MED ORDER — PREDNISONE 50 MG PO TABS: 60.0000 mg | ORAL_TABLET | Freq: Every day | ORAL | Status: DC

## 2011-06-21 MED ORDER — METHYLPREDNISOLONE SODIUM SUCC 125 MG IJ SOLR
80.0000 mg | Freq: Once | INTRAMUSCULAR | Status: AC
  Administered 2011-06-21: 80 mg via INTRAVENOUS
  Filled 2011-06-21: qty 1.28

## 2011-06-21 MED ORDER — PANTOPRAZOLE SODIUM 40 MG PO TBEC
40.0000 mg | DELAYED_RELEASE_TABLET | Freq: Every day | ORAL | Status: DC
  Administered 2011-06-22 – 2011-06-25 (×4): 40 mg via ORAL
  Filled 2011-06-21 (×5): qty 1

## 2011-06-21 NOTE — Evaluation (Signed)
Physical Therapy Evaluation Patient Details Name: Sheila Morgan MRN: [id number] DOB: 08-19-1932 Today's Date: 06/21/2011  Problem List:  Patient Active Problem List  Diagnoses  . DIABETES MELLITUS, TYPE II  . HYPERLIPIDEMIA  . UNSPECIFIED ANEMIA  . HYPERTENSION  . Polymyalgia rheumatica  . OSTEOPOROSIS  . ALKALINE PHOSPHATASE, ELEVATED  . PROTEINURIA  . BLOOD IN STOOL, OCCULT  . ADVEF, DRUG/MEDICINAL/BIOLOGICAL SUBST NOS  . SKIN CANCER, HX OF  . CORONARY ARTERY BYPASS GRAFT, HX OF  . TOTAL KNEE REPLACEMENT, HX OF  . Joint pain  . ARF (acute renal failure)  . Hypokalemia  . Hypomagnesemia    Past Medical History:  Past Medical History  Diagnosis Date  . Hyperlipidemia   . Hypertension   . Cancer     skin  . Diabetes mellitus     2  . Arthritis   . Hiatal hernia   . Hemorrhoids   . Osteoporosis   . PONV (postoperative nausea and vomiting)    Past Surgical History:  Past Surgical History  Procedure Date  . Coronary artery bypass graft   . Coronary angioplasty with stent placement   . Total hip arthroplasty     PT Assessment/Plan/Recommendation PT Assessment Clinical Impression Statement: Patient admitted with wrist and ankle pain.  Presents with decreased tolerance to and independence with all mobility.  Will benefit from skilled PT in acute setting to maximize independence and allow d/c home with spouse assist vs. to SNF depending on progress. PT Recommendation/Assessment: Patient will need skilled PT in the acute care venue PT Problem List: Decreased range of motion;Decreased strength;Decreased activity tolerance;Decreased mobility;Pain Barriers to Discharge: Inaccessible home environment Barriers to Discharge Comments: multilevel home (no level entry living) PT Therapy Diagnosis : Difficulty walking;Generalized weakness;Acute pain PT Plan PT Frequency: Min 3X/week PT Treatment/Interventions: DME instruction;Gait training;Stair training;Functional mobility  training;Therapeutic activities;Therapeutic exercise;Balance training;Patient/family education PT Recommendation Follow Up Recommendations: Skilled nursing facility;Home health PT (SNF likely unless pain is significantly improved) Equipment Recommended: None recommended by PT PT Goals  Acute Rehab PT Goals PT Goal Formulation: With patient Time For Goal Achievement: 2 weeks Pt will go Supine/Side to Sit: with supervision PT Goal: Supine/Side to Sit - Progress: Goal set today Pt will go Sit to Stand: with supervision PT Goal: Sit to Stand - Progress: Goal set today Pt will go Stand to Sit: with supervision PT Goal: Stand to Sit - Progress: Goal set today Pt will Transfer Bed to Chair/Chair to Bed: with supervision PT Transfer Goal: Bed to Chair/Chair to Bed - Progress: Goal set today Pt will Ambulate: 51 - 150 feet;with min assist;with rolling walker PT Goal: Ambulate - Progress: Goal set today Pt will Go Up / Down Stairs: 6-9 stairs;with rail(s);with min assist PT Goal: Up/Down Stairs - Progress: Goal set today Pt will Perform Home Exercise Program: Independently PT Goal: Perform Home Exercise Program - Progress: Goal set today  PT Evaluation Precautions/Restrictions  Precautions Precautions: Fall Required Braces or Orthoses: Yes Other Brace/Splint: wrist lace up support Prior Functioning  Home Living Lives With: Spouse Type of Home: House Home Layout: Multi-level Alternate Level Stairs-Rails: Right Alternate Level Stairs-Number of Steps: 7 to main level and 7 to bedrooms Home Access: Level entry Bathroom Shower/Tub: Tub/shower unit Home Adaptive Equipment: Tub transfer bench;Walker - rolling;Straight cane;Bedside commode/3-in-1 Prior Function Level of Independence: Independent with homemaking with ambulation;Independent with basic ADLs;Independent with transfers;Independent with gait Cognition Cognition Arousal/Alertness: Awake/alert Overall Cognitive Status: Appears  within functional limits for tasks assessed Sensation/Coordination  Extremity Assessment RUE Assessment RUE Assessment: Not tested (but noted swelling throughout fingers and wrist brace on) LUE Assessment LUE Assessment: Not tested RLE Assessment RLE Assessment: Within Functional Limits LLE Assessment LLE Assessment: Exceptions to WFL LLE AROM (degrees) Overall AROM Left Lower Extremity: Deficits;Due to pain LLE Overall AROM Comments: able to move left ankle and knee minimally but moves unassisted LLE Strength LLE Overall Strength: Due to pain;Deficits LLE Overall Strength Comments: able to flex ankle and knee minimally unassisted, but needs assist to move leg off bed Mobility (including Balance) Bed Mobility Bed Mobility: Yes Supine to Sit: 3: Mod assist Supine to Sit Details (indicate cue type and reason): assist for left LE and to lift trunk Sitting - Scoot to Edge of Bed: 3: Mod assist Sitting - Scoot to Edge of Bed Details (indicate cue type and reason): using pad under patient Transfers Transfers: Yes Sit to Stand: 3: Mod assist;With upper extremity assist;From bed;From chair/3-in-1 Sit to Stand Details (indicate cue type and reason): and cues for where to hold on and how to use right leg only due to severe pain in left knee and foot Stand to Sit: 3: Mod assist;To chair/3-in-1 Stand to Sit Details: cue for technique Stand Pivot Transfers: 2: Max Actuary Details (indicate cue type and reason): patient held onto therapist while scooting right foot to pivot from bed to 3:1 then to recliner    Exercise  General Exercises - Lower Extremity Ankle Circles/Pumps: AROM;Left;5 reps;Supine Quad Sets: AROM;Left;5 reps;Supine Heel Slides: AROM;Left;5 reps;Supine End of Session PT - End of Session Equipment Utilized During Treatment: Gait belt Activity Tolerance: Patient limited by pain Patient left: in chair;with call bell in reach;with family/visitor  present Nurse Communication: Mobility status for transfers General Behavior During Session: [facility] for tasks performed Cognition: [facility] for tasks performed  [facility] 06/21/2011, 2:13 PM

## 2011-06-21 NOTE — Progress Notes (Signed)
Subjective: Patient complaining of right wrist pain left foot and left ankle pain. Patient now complaining of left knee swelling and pain.  Objective: Vital signs in last 24 hours: Filed Vitals:   06/20/11 1026 06/20/11 1127 06/20/11 2215 06/21/11 0714  BP: 135/69  132/53 122/50  Pulse: 80  87 83  Temp: 99.5 F (37.5 C)  98.9 F (37.2 C) 99.4 F (37.4 C)  TempSrc: Oral  Oral Oral  Resp: 20  20 20   Height:  5\' 7"  (1.702 m)    Weight:  71.668 kg (158 lb)    SpO2: 93%  92% 95%   No intake or output data in the 24 hours ending 06/21/11 1153  Weight change: 0 kg (0 lb)  General: Alert, awake, oriented x3, in no acute distress. HEENT: No bruits, no goiter. Heart: Regular rate and rhythm, without murmurs, rubs, gallops. Lungs: Clear to auscultation bilaterally. Abdomen: Soft, nontender, nondistended, positive bowel sounds. Extremities: Right wrist with splint on with some mild swelling and tenderness to palpation. Left foot with some slight erythema on the medial ankle, left foot swelling, left foot tender to palpation. Left knee swelling, mild warmth and tender to palpation.    Lab Results:  Basename 06/21/11 0500 06/20/11 0616  NA 130* 134*  K 3.7 3.4*  CL 94* 95*  CO2 28 30  GLUCOSE 103* 114*  BUN 14 13  CREATININE 1.06 1.05  CALCIUM 8.6 9.0  MG -- 1.4*  PHOS -- --    Basename 06/20/11 0616 06/19/11 1433  AST 11 15  ALT 10 13  ALKPHOS 111 114  BILITOT 1.4* 0.6  PROT 6.8 7.7  ALBUMIN 2.6* 3.1*   No results found for this basename: LIPASE:2,AMYLASE:2 in the last 72 hours  Basename 06/21/11 0500 06/20/11 0616 06/19/11 1433  WBC 8.3 13.3* --  NEUTROABS -- -- 9.1*  HGB 8.6* 9.6* --  HCT 26.5* 28.8* --  MCV 83.3 83.5 --  PLT 293 279 --   No results found for this basename: CKTOTAL:3,CKMB:3,CKMBINDEX:3,TROPONINI:3 in the last 72 hours No components found with this basename: POCBNP:3 No results found for this basename: DDIMER:2 in the last 72 hours  Basename  06/20/11 0620  HGBA1C 6.1*   No results found for this basename: CHOL:2,HDL:2,LDLCALC:2,TRIG:2,CHOLHDL:2,LDLDIRECT:2 in the last 72 hours No results found for this basename: TSH,T4TOTAL,FREET3,T3FREE,THYROIDAB in the last 72 hours No results found for this basename: VITAMINB12:2,FOLATE:2,FERRITIN:2,TIBC:2,IRON:2,RETICCTPCT:2 in the last 72 hours  Micro Results: No results found for this or any previous visit (from the past 240 hour(s)).  Studies/Results: Dg Hand Complete Right  06/19/2011  *RADIOLOGY REPORT*  Clinical Data: Right hand pain.  RIGHT HAND - COMPLETE 3+ VIEW  Comparison: None  Findings: Degenerative changes are noted in the IP joints diffusely.  Early joint space narrowing within the MCP joints. There are moderate degenerative changes also in the first carpal metacarpal joint.  Most pronounced degenerative changes in the DIP joints diffusely and fourth PIP joint. No acute bony abnormality. Specifically, no fracture, subluxation, or dislocation.  Soft tissues are intact.  IMPRESSION: Moderate to advanced degenerative changes as above. No acute bony abnormality.  Original Report Authenticated By: Cyndie Chime, M.D.   Dg Foot 2 Views Left  06/19/2011  *RADIOLOGY REPORT*  Clinical Data: Pain, left foot swelling for 2 weeks no known injury  LEFT FOOT - 2 VIEW  Comparison: 02/06/2011  Findings: Two views of the left foot submitted.  No acute fracture or subluxation.  Dorsal soft tissue swelling is noted.  No periosteal reaction or bony erosion.  IMPRESSION: No acute fracture or subluxation.  Dorsal soft tissue swelling.  Original Report Authenticated By: Natasha Mead, M.D.    Medications:    . amLODipine  5 mg Oral Daily  . aspirin  81 mg Oral Daily  . carvedilol  12.5 mg Oral BID WC  . colchicine  0.6 mg Oral BID  . enoxaparin  40 mg Subcutaneous Q24H  . furosemide  40 mg Oral Daily  . insulin aspart  0-9 Units Subcutaneous Q4H  . isosorbide mononitrate  30 mg Oral Daily  .  lidocaine  50 mL Other Once  . losartan  100 mg Oral Daily  . magnesium sulfate 1 - 4 g bolus IVPB  2 g Intravenous Once  . methylPREDNISolone (SOLU-MEDROL) injection  80 mg Intravenous Once  . mulitivitamin with minerals  1 tablet Oral Daily  . piperacillin-tazobactam (ZOSYN)  IV  3.375 g Intravenous Q8H  . potassium chloride SA  20 mEq Oral Daily  . potassium chloride  40 mEq Oral Daily  . predniSONE  60 mg Oral Q breakfast  . simvastatin  20 mg Oral QHS  . vancomycin  750 mg Intravenous Q12H  . vitamin B-12  1,000 mcg Oral Daily    Assessment: Principal Problem:  *Joint pain Active Problems:  DIABETES MELLITUS, TYPE II  UNSPECIFIED ANEMIA  HYPERTENSION  Polymyalgia rheumatica  ARF (acute renal failure)  Hypokalemia  Hypomagnesemia   Plan: #1 Polyarticular joint pain Patient currently with pain in the right wrist, left foot and left ankle, now with swelling and left knee pain. Patient is status post attempted aspiration off the right wrist per hand surgery, with minimal aspirate. Likely secondary to acute gout flare. Patient did have fever one to 2 days ago, has an elevated sedimentation rate, uric acid is within normal limits. Hold off on steriods until seen by ortho. Continue colchicine. Patient on empiric IV antibiotics for questionable infectious etiology. We'll continue antibiotics for now until seen by orthopedics. Will consult with orthopedics for further evaluation. #2 acute renal failure Likely secondary to a prerenal azotemia. Improved with hydration. #3 hypokalemia Repleted #4 hypertension Stable. Norvasc, Coreg, Imdur, Cozaar, Lasix #5 diabetes mellitus Sliding scale insulin. #6 polymyalgia rheumatica #7 coronary artery disease status post CABG and stent placement Stable. Continue Coreg, Norvasc, Imdur, Cozaar, Lasix. #8 prophylaxis PPI for GI. Lovenox for DVT.  LOS: 2 days   [facility] 06/21/2011, 11:53 AM

## 2011-06-21 NOTE — Consult Note (Signed)
Sheila Morgan            MRN:  [id number]              DOB/SEX:  1932-11-29/female  Sheila Campbell, MD                  Jacqualine Code, PA-C   [address] [place], [place], [place]  [address]   ORTHOPAEDIC CONSULTATION   REQUESTING PHYSICIAN:  Dr. Janee Morn (Hospitalist service)  CHIEF COMPLAINT:  Painful Left knee, left ankle, and right wrist  HISTORY: Sheila Morgan a 78 y.o. female with painful left knee, left ankle, and right wrist.She had a 2 week history of right wrist swelling and pain in over the last for 5 days. She then started having left ankle swelling and pain but recently started to complain about left knee swelling and pain. She has a history of gout in the past and also has a history of polymyalgia rheumatica. She went to see her primary care physician several days prior to admission at which time she was only having the wrist pain and swelling. Uric acid level was sent off which was basically normal. The patient was placed on a low dose of tramadol without any colchicine. And at the time of admission started to have the same issue in her ankle as well as left knee similar symptoms  Resulting in having great difficulty ambulating or even getting up from a sitting position. He was seen in the emergency room where her x-rays were as noted below. Her uric acid level was6. And her sedimentation rate is significantly elevated at 113. She denies any fevers. She says that she's had gouty arthritis in the past and in the same joints. She denies any rashes on her body. She denies any abdominal pain, nausea, vomiting, chest pain or shortness of breath. The tramadol that she's been given has had no effect on her pain and she has nothing stronger to take at home. She has not recently been on any steroids per patient report.  Dr Betha Loa attempted to aspirate the right wrist but no fluid obtained.  Suggestion for Ortho to be consulted and we were asked by Dr Janee Morn to evaluate her.  PAST  MEDICAL HISTORY: Patient Active Problem List  Diagnoses Date Noted  . ARF (acute renal failure) 06/20/2011  . Hypokalemia 06/20/2011  . Hypomagnesemia 06/20/2011  . Joint pain 06/19/2011  . HYPERTENSION 12/31/2009  . OSTEOPOROSIS 12/31/2009  . SKIN CANCER, HX OF 12/31/2009  . PROTEINURIA 01/05/2008  . DIABETES MELLITUS, TYPE II 12/21/2007  . UNSPECIFIED ANEMIA 12/21/2007  . BLOOD IN STOOL, OCCULT 12/21/2007  . ADVEF, DRUG/MEDICINAL/BIOLOGICAL SUBST NOS 01/06/2007  . HYPERLIPIDEMIA 09/08/2006  . Polymyalgia rheumatica 09/08/2006  . ALKALINE PHOSPHATASE, ELEVATED 09/08/2006  . CORONARY ARTERY BYPASS GRAFT, HX OF 09/08/2006  . TOTAL KNEE REPLACEMENT, HX OF 09/08/2006   Past Medical History  Diagnosis Date  . Hyperlipidemia   . Hypertension   . Cancer     skin  . Diabetes mellitus     2  . Arthritis   . Hiatal hernia   . Hemorrhoids   . Osteoporosis   . PONV (postoperative nausea and vomiting)    Past Surgical History  Procedure Date  . Coronary artery bypass graft   . Coronary angioplasty with stent placement   . Total hip arthroplasty      MEDICATIONS:  Current facility-administered medications:amLODipine (NORVASC) tablet 5 mg, 5 mg, Oral, Daily, Haydee Monica, MD, 5  mg at 06/21/11 2130;  aspirin chewable tablet 81 mg, 81 mg, Oral, Daily, Ramiro Harvest, MD, 81 mg at 06/21/11 8657;  carvedilol (COREG) tablet 12.5 mg, 12.5 mg, Oral, BID WC, Haydee Monica, MD, 12.5 mg at 06/21/11 8469;  colchicine tablet 0.6 mg, 0.6 mg, Oral, BID, Iskra Magick-Myers, MD, 0.6 mg at 06/21/11 0922 enoxaparin (LOVENOX) injection 40 mg, 40 mg, Subcutaneous, Q24H, Haydee Monica, MD, 40 mg at 06/20/11 2250;  furosemide (LASIX) tablet 40 mg, 40 mg, Oral, Daily, Haydee Monica, MD, 40 mg at 06/21/11 6295;  insulin aspart (novoLOG) injection 0-9 Units, 0-9 Units, Subcutaneous, Q4H, Iskra Magick-Myers, MD, 2 Units at 06/20/11 2044;  isosorbide mononitrate (IMDUR) 24 hr tablet 30 mg, 30 mg, Oral,  Daily, Haydee Monica, MD, 30 mg at 06/21/11 0923 lidocaine (XYLOCAINE) 1 % (with pres) injection, , , , ;  lidocaine (XYLOCAINE) 1 % injection 50 mL, 50 mL, Other, Once, Nicki Reaper, MD;  losartan (COZAAR) tablet 100 mg, 100 mg, Oral, Daily, Haydee Monica, MD, 100 mg at 06/21/11 2841;  magnesium sulfate IVPB 2 g 50 mL, 2 g, Intravenous, Once, Iskra Magick-Myers, MD, 2 g at 06/20/11 2030 methylPREDNISolone sodium succinate (SOLU-MEDROL) 125 MG injection 80 mg, 80 mg, Intravenous, Once, Ramiro Harvest, MD, 80 mg at 06/21/11 1215;  morphine 2 MG/ML injection 1 mg, 1 mg, Intravenous, Q4H PRN, Haydee Monica, MD, 1 mg at 06/21/11 1214;  mulitivitamin with minerals tablet 1 tablet, 1 tablet, Oral, Daily, Haydee Monica, MD, 1 tablet at 06/21/11 3244;  pantoprazole (PROTONIX) EC tablet 40 mg, 40 mg, Oral, Daily, Ramiro Harvest, MD piperacillin-tazobactam (ZOSYN) IVPB 3.375 g, 3.375 g, Intravenous, Q8H, Loma Messing Borgerding, PHARMD, 3.375 g at 06/21/11 1047;  potassium chloride SA (K-DUR,KLOR-CON) CR tablet 20 mEq, 20 mEq, Oral, Daily, Haydee Monica, MD, 20 mEq at 06/20/11 1001;  potassium chloride SA (K-DUR,KLOR-CON) CR tablet 40 mEq, 40 mEq, Oral, Daily, Iskra Magick-Myers, MD, 40 mEq at 06/21/11 0102 simvastatin (ZOCOR) tablet 20 mg, 20 mg, Oral, QHS, Haydee Monica, MD, 20 mg at 06/20/11 2309;  traMADol (ULTRAM) tablet 100 mg, 100 mg, Oral, Q6H PRN, Haydee Monica, MD, 100 mg at 06/21/11 0950;  vancomycin (VANCOCIN) 750 mg in sodium chloride 0.9 % 150 mL IVPB, 750 mg, Intravenous, Q12H, Loma Messing Borgerding, PHARMD, 750 mg at 06/21/11 7253 vitamin B-12 (CYANOCOBALAMIN) tablet 1,000 mcg, 1,000 mcg, Oral, Daily, Haydee Monica, MD, 1,000 mcg at 06/21/11 6644;  DISCONTD: colchicine tablet 0.6 mg, 0.6 mg, Oral, Q1H PRN, Haydee Monica, MD, 0.6 mg at 06/20/11 1445;  DISCONTD: predniSONE (DELTASONE) tablet 60 mg, 60 mg, Oral, Q breakfast, Ramiro Harvest, MD  ALLERGIES:  No Known Allergies  REVIEW OF  SYSTEMS: REVIEWED IN DETAIL IN CHART  FAMILY HISTORY:   Family History  Problem Relation Age of Onset  . Heart disease Father   . Cancer Sister     breast  . Diabetes Sister   . Heart disease Brother   . Heart disease Brother   . Heart disease Brother   . Diabetes Sister     SOCIAL HISTORY:   History  Substance Use Topics  . Smoking status: Never Smoker   . Smokeless tobacco: Never Used  . Alcohol Use: No     EXAMINATION: Recent vital signs:  Patient Vitals for the past 24 hrs:  BP Temp Temp src Pulse Resp SpO2  06/21/11 0714 122/50 mmHg 99.4 F (37.4 C) Oral 83  20  95 %  July 15, 2011 2215 132/53 mmHg 98.9 F (37.2 C) Oral 87  20  92 %     Musculoskeletal Exam  :  Left ankle reveals mild diffuse erythema with swelling.  Marked decrease ROM of ankle joint and toes.  EHL, FHL, Ant/post Tib intact.  Pain to palpation more in subtalar area.  Left knee reveals mild erythema with skin intact.  ROM 0-30 with pain. Joint is warm. Moderate effusion but not tense.  Difficult to evaluate ligamentous stability.  Right wrist per Dr Dionicio Stall note.    DIAGNOSTIC STUDIES: Recent laboratory studies:  Basename 06/21/11 0500 2011-07-15 0616 06/19/11 1433  WBC 8.3 13.3* 11.2*  HGB 8.6* 9.6* 10.7*  HCT 26.5* 28.8* 33.0*  PLT 293 279 326    Basename 06/21/11 0500 15-Jul-2011 0616 06/19/11 1433 06/16/11 1233  NA 130* 134* 132* 137  K 3.7 3.4* 3.0* 2.9*  CL 94* 95* 89* 92*  CO2 28 30 33* 32  BUN 14 13 18 14   CREATININE 1.06 1.05 1.13* 1.0  GLUCOSE 103* 114* 145* 97  CALCIUM 8.6 9.0 9.8 9.8   No results found for this basename: INR,  PROTIME     Recent Radiographic Studies :  Dg Hand Complete Right  06/19/2011  *RADIOLOGY REPORT*  Clinical Data: Right hand pain.  RIGHT HAND - COMPLETE 3+ VIEW  Comparison: None  Findings: Degenerative changes are noted in the IP joints diffusely.  Early joint space narrowing within the MCP joints. There are moderate degenerative changes also in the  first carpal metacarpal joint.  Most pronounced degenerative changes in the DIP joints diffusely and fourth PIP joint. No acute bony abnormality. Specifically, no fracture, subluxation, or dislocation.  Soft tissues are intact.  IMPRESSION: Moderate to advanced degenerative changes as above. No acute bony abnormality.  Original Report Authenticated By: Cyndie Chime, M.D.   Dg Foot 2 Views Left  06/19/2011  *RADIOLOGY REPORT*  Clinical Data: Pain, left foot swelling for 2 weeks no known injury  LEFT FOOT - 2 VIEW  Comparison: 02/06/2011  Findings: Two views of the left foot submitted.  No acute fracture or subluxation.  Dorsal soft tissue swelling is noted.  No periosteal reaction or bony erosion.  IMPRESSION: No acute fracture or subluxation.  Dorsal soft tissue swelling.  Original Report Authenticated By: Natasha Mead, M.D.      Assessment:  1.  R/O recurrent gout exacerbation R Wrist, L knee, L ankle 2.  R/O septic arthritis ( somewhat doubtful secondary to the number of joints involved) 3.  R/O PMR (polymyalgia rheumatica) exacerbation    Plan:  Aspiration of 40 ml of cloudy yellow fluid from left knee was performed.  No steroids injected.  Will wait to see gm stain, cell count with diff, crystals, and glucose results.  If gram stain negative then starting steroids may be beneficial.   Would continue Colcrys (colchicine) on a daily basis. Antibiotics started previously so cultures may be negative. Blood cultures might be beneficial to check for systemic bacteria. Will follow with you.  PROCEDURE NOTE:  Procedure, risk, and benefits explained to procedure for joint aspiration and she gave verbal consent to proceed.  The left knee was then prepped with alcohol and betadine swabs x 3.  Lateral joint was injected with 1% xylocaine for anesthesia.  A syringe with an 18 gauge needle was used to aspirate 40 ml of yellow cloudy fluid from the knee.  She tolerated the procedure well.  Specimens sent  for stat gram stain,  culture, cell count with diff, glucose, and crystals.  PETRARCA,BRIAN 06/21/2011, 2:41 PM

## 2011-06-22 DIAGNOSIS — I2510 Atherosclerotic heart disease of native coronary artery without angina pectoris: Secondary | ICD-10-CM | POA: Diagnosis present

## 2011-06-22 DIAGNOSIS — Z951 Presence of aortocoronary bypass graft: Secondary | ICD-10-CM | POA: Diagnosis not present

## 2011-06-22 DIAGNOSIS — I10 Essential (primary) hypertension: Secondary | ICD-10-CM | POA: Diagnosis present

## 2011-06-22 DIAGNOSIS — D72829 Elevated white blood cell count, unspecified: Secondary | ICD-10-CM | POA: Diagnosis present

## 2011-06-22 DIAGNOSIS — M1120 Other chondrocalcinosis, unspecified site: Secondary | ICD-10-CM | POA: Diagnosis present

## 2011-06-22 DIAGNOSIS — E876 Hypokalemia: Secondary | ICD-10-CM | POA: Diagnosis present

## 2011-06-22 DIAGNOSIS — R269 Unspecified abnormalities of gait and mobility: Secondary | ICD-10-CM | POA: Diagnosis not present

## 2011-06-22 DIAGNOSIS — N179 Acute kidney failure, unspecified: Secondary | ICD-10-CM | POA: Diagnosis present

## 2011-06-22 DIAGNOSIS — M353 Polymyalgia rheumatica: Secondary | ICD-10-CM | POA: Diagnosis present

## 2011-06-22 DIAGNOSIS — D649 Anemia, unspecified: Secondary | ICD-10-CM | POA: Diagnosis present

## 2011-06-22 DIAGNOSIS — R197 Diarrhea, unspecified: Secondary | ICD-10-CM | POA: Diagnosis not present

## 2011-06-22 DIAGNOSIS — Z9861 Coronary angioplasty status: Secondary | ICD-10-CM | POA: Diagnosis not present

## 2011-06-22 DIAGNOSIS — E119 Type 2 diabetes mellitus without complications: Secondary | ICD-10-CM | POA: Diagnosis not present

## 2011-06-22 DIAGNOSIS — M064 Inflammatory polyarthropathy: Secondary | ICD-10-CM | POA: Diagnosis present

## 2011-06-22 DIAGNOSIS — Z7982 Long term (current) use of aspirin: Secondary | ICD-10-CM | POA: Diagnosis not present

## 2011-06-22 DIAGNOSIS — Z96649 Presence of unspecified artificial hip joint: Secondary | ICD-10-CM | POA: Diagnosis not present

## 2011-06-22 DIAGNOSIS — M109 Gout, unspecified: Secondary | ICD-10-CM | POA: Diagnosis not present

## 2011-06-22 DIAGNOSIS — E785 Hyperlipidemia, unspecified: Secondary | ICD-10-CM | POA: Diagnosis present

## 2011-06-22 DIAGNOSIS — M2550 Pain in unspecified joint: Secondary | ICD-10-CM | POA: Diagnosis not present

## 2011-06-22 DIAGNOSIS — K449 Diaphragmatic hernia without obstruction or gangrene: Secondary | ICD-10-CM | POA: Diagnosis present

## 2011-06-22 DIAGNOSIS — R700 Elevated erythrocyte sedimentation rate: Secondary | ICD-10-CM | POA: Diagnosis present

## 2011-06-22 DIAGNOSIS — M810 Age-related osteoporosis without current pathological fracture: Secondary | ICD-10-CM | POA: Diagnosis present

## 2011-06-22 DIAGNOSIS — M130 Polyarthritis, unspecified: Secondary | ICD-10-CM | POA: Diagnosis not present

## 2011-06-22 DIAGNOSIS — Z85828 Personal history of other malignant neoplasm of skin: Secondary | ICD-10-CM | POA: Diagnosis not present

## 2011-06-22 DIAGNOSIS — M25539 Pain in unspecified wrist: Secondary | ICD-10-CM | POA: Diagnosis not present

## 2011-06-22 LAB — DIFFERENTIAL
Lymphocytes Relative: 4 % — ABNORMAL LOW (ref 12–46)
Lymphs Abs: 0.3 10*3/uL — ABNORMAL LOW (ref 0.7–4.0)
Monocytes Relative: 3 % (ref 3–12)
Neutro Abs: 7.1 10*3/uL (ref 1.7–7.7)
Neutrophils Relative %: 92 % — ABNORMAL HIGH (ref 43–77)

## 2011-06-22 LAB — CBC
Hemoglobin: 9.2 g/dL — ABNORMAL LOW (ref 12.0–15.0)
MCH: 27.3 pg (ref 26.0–34.0)
Platelets: 346 10*3/uL (ref 150–400)
RBC: 3.37 MIL/uL — ABNORMAL LOW (ref 3.87–5.11)
WBC: 7.8 10*3/uL (ref 4.0–10.5)

## 2011-06-22 LAB — BASIC METABOLIC PANEL
CO2: 27 mEq/L (ref 19–32)
Calcium: 8.8 mg/dL (ref 8.4–10.5)
Chloride: 99 mEq/L (ref 96–112)
GFR calc non Af Amer: 53 mL/min — ABNORMAL LOW (ref >90)

## 2011-06-22 LAB — GLUCOSE, CAPILLARY: Glucose-Capillary: 143 mg/dL — ABNORMAL HIGH (ref 70–99)

## 2011-06-22 LAB — VANCOMYCIN, TROUGH: Vancomycin Tr: 18.4 ug/mL (ref 10.0–20.0)

## 2011-06-22 MED ORDER — INSULIN ASPART 100 UNIT/ML ~~LOC~~ SOLN
0.0000 [IU] | Freq: Three times a day (TID) | SUBCUTANEOUS | Status: DC
  Administered 2011-06-22: 2 [IU] via SUBCUTANEOUS
  Administered 2011-06-22 (×2): 1 [IU] via SUBCUTANEOUS
  Administered 2011-06-23: 2 [IU] via SUBCUTANEOUS
  Administered 2011-06-23: 1 [IU] via SUBCUTANEOUS
  Administered 2011-06-23: 2 [IU] via SUBCUTANEOUS
  Administered 2011-06-24: 1 [IU] via SUBCUTANEOUS
  Administered 2011-06-24: 2 [IU] via SUBCUTANEOUS
  Filled 2011-06-22: qty 3

## 2011-06-22 MED ORDER — PREDNISONE 50 MG PO TABS
60.0000 mg | ORAL_TABLET | Freq: Every day | ORAL | Status: DC
  Administered 2011-06-22 – 2011-06-25 (×4): 60 mg via ORAL
  Filled 2011-06-22 (×4): qty 1

## 2011-06-22 NOTE — Progress Notes (Signed)
PHARMACY CONSULT - FOLLOW UP   78 yo F with pain in the right wrist, left foot and left ankle, now with swelling and left knee pain.  Patient currently on Day 3 of Vanc/zosyn empirically for r/o septic joint.  Knee fluid NGTD. Renal fxn stable, afebrile, WBC down to wnl Vancomycin trough therapeutic at 18.4 (goal 15-20), appropriate timing.    Plan:  Continue Vancomycin at 750 mg IV q12h.   Geoffry Paradise, PharmD.   Pager:  [phone] 7:13 PM

## 2011-06-22 NOTE — Consult Note (Signed)
Addendum:  Films of left knee with evidence of chondrocalcinosis.Most likely arthralgias related to gout, pseudogout or recurrent PMR.No growth on knee aspirate at day one.Continue with present Rx, if cultures neg in next day would consider discontinuing antibiotics.

## 2011-06-22 NOTE — Progress Notes (Signed)
Subjective: Patient states some improvement with pain in wrist, ankle and knee.  Objective: Vital signs in last 24 hours: Filed Vitals:   06/21/11 0714 06/21/11 2200 06/22/11 0600 06/22/11 1448  BP: 122/50 129/61 126/63 123/74  Pulse: 83 82 81 75  Temp: 99.4 F (37.4 C) 98.3 F (36.8 C) 98 F (36.7 C) 98 F (36.7 C)  TempSrc: Oral Oral Oral Oral  Resp: 20 18 18 18   Height:      Weight:      SpO2: 95% 96% 96% 94%    Intake/Output Summary (Last 24 hours) at 06/22/11 1847 Last data filed at 06/22/11 1450  Gross per 24 hour  Intake    830 ml  Output    250 ml  Net    580 ml    Weight change:   General: Alert, awake, oriented x3, in no acute distress. HEENT: No bruits, no goiter. Heart: Regular rate and rhythm, without murmurs, rubs, gallops. Lungs: Clear to auscultation bilaterally. Abdomen: Soft, nontender, nondistended, positive bowel sounds. Extremities: Right wrist with decreased swelling and decreased tenderness to palpation. Left foot with decreased erythema on the medial ankle, decreased swelling and tenderness palpation. Left knee swelling improved, mild warmth and decreased tenderness to palpation.    Lab Results:  Basename 06/22/11 0510 06/21/11 0500 06/20/11 0616  NA 135 130* --  K 3.7 3.7 --  CL 99 94* --  CO2 27 28 --  GLUCOSE 165* 103* --  BUN 21 14 --  CREATININE 1.00 1.06 --  CALCIUM 8.8 8.6 --  MG -- -- 1.4*  PHOS -- -- --    Basename 06/20/11 0616  AST 11  ALT 10  ALKPHOS 111  BILITOT 1.4*  PROT 6.8  ALBUMIN 2.6*   No results found for this basename: LIPASE:2,AMYLASE:2 in the last 72 hours  Basename 06/22/11 0510 06/21/11 0500  WBC 7.8 8.3  NEUTROABS 7.1 --  HGB 9.2* 8.6*  HCT 28.3* 26.5*  MCV 84.0 83.3  PLT 346 293   No results found for this basename: CKTOTAL:3,CKMB:3,CKMBINDEX:3,TROPONINI:3 in the last 72 hours No components found with this basename: POCBNP:3 No results found for this basename: DDIMER:2 in the last 72  hours  Basename 06/20/11 0620  HGBA1C 6.1*   No results found for this basename: CHOL:2,HDL:2,LDLCALC:2,TRIG:2,CHOLHDL:2,LDLDIRECT:2 in the last 72 hours No results found for this basename: TSH,T4TOTAL,FREET3,T3FREE,THYROIDAB in the last 72 hours  Basename 06/21/11 0843  VITAMINB12 >2000*  FOLATE >20.0  FERRITIN 331*  TIBC 152*  IRON 14*  RETICCTPCT --    Micro Results: Recent Results (from the past 240 hour(s))  GRAM STAIN     Status: Normal   Collection Time   06/21/11  1:50 PM      Component Value Range Status Comment   Specimen Description KNEE   Final    Special Requests Normal   Final    Gram Stain     Final    Value: GRAM STAIN PERFORMED ON CYTOSPIN     WBC PRESENT, PREDOMINANTLY PMN     NO ORGANISMS SEEN     Gram Stain Report Called to,Read Back By and Verified With:     T.SMITH RN AT 1444 ON 06/21/11 BY C.BONGEL   Report Status 06/21/2011 FINAL   Final   BODY FLUID CULTURE     Status: Normal (Preliminary result)   Collection Time   06/21/11  1:52 PM      Component Value Range Status Comment   Specimen Description KNEE  Final    Special Requests Normal   Final    Gram Stain     Final    Value: CYTOSPIN SLIDE WBC PRESENT, PREDOMINANTLY PMN     NO ORGANISMS SEEN     Gram Stain Report Called to,Read Back By and Verified With: Gram Stain Report Called to,Read Back By and Verified With: T.SMITH RN AT 1444 ON 06/21/11 BY C.BONGEL Performed by [facility]   Culture NO GROWTH 1 DAY   Final    Report Status PENDING   Incomplete     Studies/Results: Dg Knee Complete 4 Views Left  06/22/2011  *RADIOLOGY REPORT*  Clinical Data: Left knee pain and swelling.  LEFT KNEE - COMPLETE 4+ VIEW  Comparison: None.  Findings: There is no evidence of fracture or dislocation. Tricompartmental joint space narrowing is noted.  This is particularly prominent at the patellofemoral compartment, with associated cortical irregularity.  There is suggestion of associated  chondrocalcinosis.  Wall osteophytes are noted at the intercondylar notch.  A small knee joint effusion is noted.  Scattered vascular calcifications are seen.  IMPRESSION:  1.  No evidence of fracture or dislocation. 2.  Tricompartmental osteoarthritis, most prominent at the patellofemoral compartment, with suggestion of chondrocalcinosis. 3.  Small knee joint effusion seen. 4.  Scattered vascular calcifications noted.  Original Report Authenticated By: Tonia Ghent, M.D.    Medications:    . amLODipine  5 mg Oral Daily  . aspirin  81 mg Oral Daily  . carvedilol  12.5 mg Oral BID WC  . colchicine  0.6 mg Oral BID  . enoxaparin  40 mg Subcutaneous Q24H  . furosemide  40 mg Oral Daily  . insulin aspart  0-9 Units Subcutaneous TID WC  . isosorbide mononitrate  30 mg Oral Daily  . lidocaine      . lidocaine  50 mL Other Once  . losartan  100 mg Oral Daily  . mulitivitamin with minerals  1 tablet Oral Daily  . pantoprazole  40 mg Oral Daily  . piperacillin-tazobactam (ZOSYN)  IV  3.375 g Intravenous Q8H  . potassium chloride SA  20 mEq Oral Daily  . potassium chloride  40 mEq Oral Daily  . predniSONE  60 mg Oral Q breakfast  . simvastatin  20 mg Oral QHS  . vancomycin  750 mg Intravenous Q12H  . vitamin B-12  1,000 mcg Oral Daily  . DISCONTD: insulin aspart  0-9 Units Subcutaneous Q4H    Assessment: Principal Problem:  *Joint pain Active Problems:  DIABETES MELLITUS, TYPE II  UNSPECIFIED ANEMIA  HYPERTENSION  Polymyalgia rheumatica  ARF (acute renal failure)  Hypokalemia  Hypomagnesemia   Plan: #1 Polyarticular joint pain Likely secondary to recurrent PMR, pseudogout vs gout vs inflammatory arthritis. Patient is status post attempted aspiration off the right wrist per hand surgery, with minimal aspirate. S/p L knee aspiration. Aspirate with neg gram stain to date and no crystal growth. ESR and CRP elevated.Patient given IV solumedrol x 1 yesterday with clinical improvement.  Continue colchicine.  Will continue empiric antibiotics for now and if no growth by tomorrow will d/c antibiotics. Start oral prednisone. Ortho ff and appreciate input and rxcs. #2 acute renal failure Likely secondary to a prerenal azotemia. Improved with hydration. #3 hypokalemia Repleted #4 hypertension Stable. Norvasc, Coreg, Imdur, Cozaar, Lasix #5 diabetes mellitus Sliding scale insulin. #6 polymyalgia rheumatica- See #1. #7 coronary artery disease status post CABG and stent placement Stable. Continue Coreg, Norvasc, Imdur, Cozaar, Lasix. #8 prophylaxis  PPI for GI. Lovenox for DVT.  LOS: 3 days   [facility] 06/22/2011, 6:47 PM

## 2011-06-23 DIAGNOSIS — R269 Unspecified abnormalities of gait and mobility: Secondary | ICD-10-CM | POA: Diagnosis not present

## 2011-06-23 DIAGNOSIS — E119 Type 2 diabetes mellitus without complications: Secondary | ICD-10-CM | POA: Diagnosis not present

## 2011-06-23 DIAGNOSIS — M2550 Pain in unspecified joint: Secondary | ICD-10-CM | POA: Diagnosis not present

## 2011-06-23 DIAGNOSIS — M130 Polyarthritis, unspecified: Secondary | ICD-10-CM | POA: Diagnosis not present

## 2011-06-23 LAB — GLUCOSE, CAPILLARY
Glucose-Capillary: 147 mg/dL — ABNORMAL HIGH (ref 70–99)
Glucose-Capillary: 126 mg/dL — ABNORMAL HIGH (ref 70–99)
Glucose-Capillary: 149 mg/dL — ABNORMAL HIGH (ref 70–99)

## 2011-06-23 LAB — BASIC METABOLIC PANEL
Chloride: 100 mEq/L (ref 96–112)
GFR calc Af Amer: 67 mL/min — ABNORMAL LOW (ref >90)
GFR calc non Af Amer: 58 mL/min — ABNORMAL LOW (ref >90)
Potassium: 3.4 mEq/L — ABNORMAL LOW (ref 3.5–5.1)

## 2011-06-23 LAB — CBC
HCT: 29.3 % — ABNORMAL LOW (ref 36.0–46.0)
Hemoglobin: 9.6 g/dL — ABNORMAL LOW (ref 12.0–15.0)
RDW: 14.8 % (ref 11.5–15.5)
WBC: 10.3 10*3/uL (ref 4.0–10.5)

## 2011-06-23 NOTE — Progress Notes (Signed)
Subjective: Patient states improvement with her right wrist left ankle and foot and left knee pain and swelling.  Objective: Vital signs in last 24 hours: Filed Vitals:   06/21/11 2200 06/22/11 0600 06/22/11 1448 06/23/11 0600  BP: 129/61 126/63 123/74 157/70  Pulse: 82 81 75 65  Temp: 98.3 F (36.8 C) 98 F (36.7 C) 98 F (36.7 C) 98.4 F (36.9 C)  TempSrc: Oral Oral Oral Oral  Resp: 18 18 18 18   Height:      Weight:      SpO2: 96% 96% 94% 97%    Intake/Output Summary (Last 24 hours) at 06/23/11 1202 Last data filed at 06/23/11 0840  Gross per 24 hour  Intake    750 ml  Output    300 ml  Net    450 ml    Weight change:   General: Alert, awake, oriented x3, in no acute distress. HEENT: No bruits, no goiter. Heart: Regular rate and rhythm, without murmurs, rubs, gallops. Lungs: Clear to auscultation bilaterally. Abdomen: Soft, nontender, nondistended, positive bowel sounds. Extremities: Right wrist with decreased swelling, nttp. Left foot with decreased erythema on the medial ankle, decreased swelling and tenderness palpation. Left knee swelling improved, decreased tenderness to palpation.    Lab Results:  [facility] 06/23/11 0920 06/22/11 0510  NA 137 135  K 3.4* 3.7  CL 100 99  CO2 28 27  GLUCOSE 165* 165*  BUN 23 21  CREATININE 0.93 1.00  CALCIUM 9.1 8.8  MG -- --  PHOS -- --   No results found for this basename: AST:2,ALT:2,ALKPHOS:2,BILITOT:2,PROT:2,ALBUMIN:2 in the last 72 hours No results found for this basename: LIPASE:2,AMYLASE:2 in the last 72 hours  Basename 06/23/11 0920 06/22/11 0510  WBC 10.3 7.8  NEUTROABS -- 7.1  HGB 9.6* 9.2*  HCT 29.3* 28.3*  MCV 83.5 84.0  PLT 431* 346   No results found for this basename: CKTOTAL:3,CKMB:3,CKMBINDEX:3,TROPONINI:3 in the last 72 hours No components found with this basename: POCBNP:3 No results found for this basename: DDIMER:2 in the last 72 hours No results found for this basename: HGBA1C:2 in the  last 72 hours No results found for this basename: CHOL:2,HDL:2,LDLCALC:2,TRIG:2,CHOLHDL:2,LDLDIRECT:2 in the last 72 hours No results found for this basename: TSH,T4TOTAL,FREET3,T3FREE,THYROIDAB in the last 72 hours  Basename 06/21/11 0843  VITAMINB12 >2000*  FOLATE >20.0  FERRITIN 331*  TIBC 152*  IRON 14*  RETICCTPCT --    Micro Results: Recent Results (from the past 240 hour(s))  GRAM STAIN     Status: Normal   Collection Time   06/21/11  1:50 PM      Component Value Range Status Comment   Specimen Description KNEE   Final    Special Requests Normal   Final    Gram Stain     Final    Value: GRAM STAIN PERFORMED ON CYTOSPIN     WBC PRESENT, PREDOMINANTLY PMN     NO ORGANISMS SEEN     Gram Stain Report Called to,Read Back By and Verified With:     T.SMITH RN AT 1444 ON 06/21/11 BY C.BONGEL   Report Status 06/21/2011 FINAL   Final   BODY FLUID CULTURE     Status: Normal (Preliminary result)   Collection Time   06/21/11  1:52 PM      Component Value Range Status Comment   Specimen Description KNEE   Final    Special Requests Normal   Final    Gram Stain     Final  Value: CYTOSPIN SLIDE WBC PRESENT, PREDOMINANTLY PMN     NO ORGANISMS SEEN     Gram Stain Report Called to,Read Back By and Verified With: Gram Stain Report Called to,Read Back By and Verified With: T.SMITH RN AT 1444 ON 06/21/11 BY C.BONGEL Performed by [facility]   Culture NO GROWTH 1 DAY   Final    Report Status PENDING   Incomplete     Studies/Results: Dg Knee Complete 4 Views Left  06/22/2011  *RADIOLOGY REPORT*  Clinical Data: Left knee pain and swelling.  LEFT KNEE - COMPLETE 4+ VIEW  Comparison: None.  Findings: There is no evidence of fracture or dislocation. Tricompartmental joint space narrowing is noted.  This is particularly prominent at the patellofemoral compartment, with associated cortical irregularity.  There is suggestion of associated chondrocalcinosis.  Wall osteophytes are noted at  the intercondylar notch.  A small knee joint effusion is noted.  Scattered vascular calcifications are seen.  IMPRESSION:  1.  No evidence of fracture or dislocation. 2.  Tricompartmental osteoarthritis, most prominent at the patellofemoral compartment, with suggestion of chondrocalcinosis. 3.  Small knee joint effusion seen. 4.  Scattered vascular calcifications noted.  Original Report Authenticated By: Tonia Ghent, M.D.    Medications:    . amLODipine  5 mg Oral Daily  . aspirin  81 mg Oral Daily  . carvedilol  12.5 mg Oral BID WC  . colchicine  0.6 mg Oral BID  . enoxaparin  40 mg Subcutaneous Q24H  . furosemide  40 mg Oral Daily  . insulin aspart  0-9 Units Subcutaneous TID WC  . isosorbide mononitrate  30 mg Oral Daily  . lidocaine  50 mL Other Once  . losartan  100 mg Oral Daily  . mulitivitamin with minerals  1 tablet Oral Daily  . pantoprazole  40 mg Oral Daily  . potassium chloride SA  20 mEq Oral Daily  . potassium chloride  40 mEq Oral Daily  . predniSONE  60 mg Oral Q breakfast  . simvastatin  20 mg Oral QHS  . vitamin B-12  1,000 mcg Oral Daily  . DISCONTD: piperacillin-tazobactam (ZOSYN)  IV  3.375 g Intravenous Q8H  . DISCONTD: vancomycin  750 mg Intravenous Q12H    Assessment: Principal Problem:  *Joint pain Active Problems:  DIABETES MELLITUS, TYPE II  UNSPECIFIED ANEMIA  HYPERTENSION  Polymyalgia rheumatica  ARF (acute renal failure)  Hypokalemia  Hypomagnesemia   Plan: #1 Polyarticular joint pain Likely secondary to recurrent PMR, pseudogout vs gout vs inflammatory arthritis. Patient is status post attempted aspiration off the right wrist per hand surgery, with minimal aspirate. S/p L knee aspiration. Aspirate with neg gram stain to date and no crystal growth. ESR and CRP elevated. Significant clinical improvement on steriods. Continue colchicine.  D/C empiric antibiotics. Ortho ff and appreciate input and rxcs.Will need rheumatological outpatient follow  up. F/u with ortho as well. PT/OT. #2 acute renal failure Likely secondary to a prerenal azotemia. Resolved. #3 hypokalemia Replete #4 hypertension Stable. Norvasc, Coreg, Imdur, Cozaar, Lasix #5 diabetes mellitus Sliding scale insulin. #6 polymyalgia rheumatica- See #1. #7 coronary artery disease status post CABG and stent placement Stable. Continue Coreg, Norvasc, Imdur, Cozaar, Lasix. #8. Anemia H/H stable. F/u as outpatient. #9 prophylaxis PPI for GI. Lovenox for DVT.  LOS: 4 days   Tateanna Bach 06/23/2011, 12:02 PM

## 2011-06-23 NOTE — Progress Notes (Signed)
Patient ID: Sheila Morgan, female   DOB: [birth year], 78 y.o.   MRN: [id number] PATIENT ID: Sheila Morgan        MRN:  [id number]          DOB/AGE: 1932/10/02 / 78 y.o.  Norlene Campbell, MD   Jacqualine Code, PA-C [address] [place], [place], [place]  [address]                             [phone]   CONSULT FOLLOW-UP PROGRESS NOTE  Subjective:  States much improved with her pain in the right wrist, left knee, and left ankle.  Able to move joints now with minimal discomfort but continues with some pain.  States unable to make a complete fist on right hand.  Objective: Vital signs in last 24 hours:   Patient Vitals for the past 24 hrs:  BP Temp Temp src Pulse Resp SpO2  06/23/11 0600 157/70 mmHg 98.4 F (36.9 C) Oral 65  18  97 %  06/22/11 1448 123/74 mmHg 98 F (36.7 C) Oral 75  18  94 %    Intake/Output from previous day:   02/18 0701 - 02/19 0700 In: 630 [P.O.:480] Out: 550 [Urine:550]   Intake/Output this shift:       Intake/Output      02/18 0701 - 02/19 0700 02/19 0701 - 02/20 0700   P.O. 480    IV Piggyback 150    Total Intake(mL/kg) 630 (8.8)    Urine (mL/kg/hr) 550 (0.3)    Total Output 550    Net +80         Urine Occurrence 2 x       LABORATORY DATA:  Basename 06/22/11 0510 06/21/11 0500 06/20/11 0616 06/19/11 1433  WBC 7.8 8.3 13.3* 11.2*  HGB 9.2* 8.6* 9.6* 10.7*  HCT 28.3* 26.5* 28.8* 33.0*  PLT 346 293 279 326    Basename 06/22/11 0510 06/21/11 0500 06/20/11 0616 06/19/11 1433 06/16/11 1233  NA 135 130* 134* 132* 137  K 3.7 3.7 3.4* 3.0* 2.9*  CL 99 94* 95* 89* 92*  CO2 27 28 30  33* 32  BUN 21 14 13 18 14   CREATININE 1.00 1.06 1.05 1.13* 1.0  GLUCOSE 165* 103* 114* 145* 97  CALCIUM 8.8 8.6 9.0 9.8 9.8   No results found for this basename: INR, PROTIME    Examination:  General appearance: alert, cooperative and mild distress  Left knee trace effusion.  ROM 3 degrees to 110 degrees today.  Mild crepitus Left ankle much less swollen and  less warmth.  Increased ROM compared to Sunday. Right hand/wrist less swollen but having difficulty with making a fist.    Assessment:   Possible gout/pseudogout Possible PMR exacerbation Possible infective process but so far NG on culture from left knee       ADDITIONAL DIAGNOSIS:  Principal Problem:  *Joint pain Active Problems:  DIABETES MELLITUS, TYPE II  UNSPECIFIED ANEMIA  HYPERTENSION  Polymyalgia rheumatica  ARF (acute renal failure)  Hypokalemia  Hypomagnesemia     Plan: Occupational Therapy will be ordered for Right wrist and hand  PT for ambulation.  If needed may consider equalizer boot to left ankle if having problems with weight bearing May use Platform walker on right side Would suggest f/u appointment with a Rheumatologist after discharge May f/u with Dr Thurston Hole once discharged.       Sheila Morgan 06/23/2011, 8:32 AM

## 2011-06-23 NOTE — Progress Notes (Signed)
Physical Therapy Treatment Patient Details Name: Sheila Morgan MRN: [id number] DOB: 1933/02/07 Today's Date: 06/23/2011  PT Assessment/Plan  PT - Assessment/Plan Comments on Treatment Session: Patient progressing nicely with tolerance to weight bearing and able to initiate training on steps.  Will need at least another day  practice to ensure safety at home with steps prior to discharge PT Plan: Discharge plan needs to be updated PT Frequency: Min 3X/week Follow Up Recommendations: Home health PT Equipment Recommended: Other (comment) (platform for her rolling walker for right UE.) PT Goals  Acute Rehab PT Goals Pt will go Supine/Side to Sit: with modified independence PT Goal: Supine/Side to Sit - Progress: Updated due to goal met Pt will go Sit to Stand: with supervision PT Goal: Sit to Stand - Progress: Progressing toward goal Pt will go Stand to Sit: with supervision PT Goal: Stand to Sit - Progress: Progressing toward goal Pt will Ambulate: 51 - 150 feet;with supervision;with rolling walker;with other equipment (comment) (and right UE platform) PT Goal: Ambulate - Progress: Updated due to goal met Pt will Go Up / Down Stairs: 6-9 stairs;with supervision;with rail(s);with cane PT Goal: Up/Down Stairs - Progress: Updated due to goal met  PT Treatment Precautions/Restrictions  Precautions Precautions: Fall Required Braces or Orthoses: Yes Other Brace/Splint: right wrist prefab support Restrictions Weight Bearing Restrictions: No Mobility (including Balance) Bed Mobility Bed Mobility: Yes Supine to Sit: 5: Supervision Sitting - Scoot to Edge of Bed: 5: Supervision Sitting - Scoot to [place] of Bed Details (indicate cue type and reason): increased time and with some difficulty due to right wrist still stiff and painful Transfers Sit to Stand: 4: Min assist;With upper extremity assist;From bed;From chair/3-in-1 Sit to Stand Details (indicate cue type and reason): cues for hand  placement/technique Stand to Sit: 4: Min assist;With upper extremity assist Stand to Sit Details: cues for hand placement/technique Ambulation/Gait Ambulation/Gait: Yes Ambulation/Gait Assistance: 4: Min assist Ambulation/Gait Assistance Details (indicate cue type and reason): cues for technique to protect left LE due to pain Ambulation Distance (Feet): 140 Feet Assistive device: Rolling walker;Right platform walker Gait Pattern: Step-to pattern;Antalgic;Decreased step length - right;Decreased stride length Stairs: Yes Stairs Assistance: 4: Min assist;3: Mod assist Stairs Assistance Details (indicate cue type and reason): initially with hand held assist on left, then patient used rail on right with both hands when trying to have her spouse help her (he stated needs to hold rail himself for safety)  lead with right leg going up and left coming down Stair Management Technique: Step to pattern;One rail Right;Forwards Number of Stairs: 7     Exercise    End of Session PT - End of Session Equipment Utilized During Treatment: Gait belt Activity Tolerance: Patient limited by pain Patient left: in chair;with call bell in reach;with family/visitor present General Behavior During Session: [facility] for tasks performed Cognition: [facility] for tasks performed  [facility] 06/23/2011, 3:49 PM

## 2011-06-23 NOTE — Progress Notes (Signed)
Initial visit with pt on referral by chaplain.   Pt expressed grief over life changes with age and changes in role she plays in family.  Reported that she has always been "backbone" of family and described profound ways she has cared for children and grandchildren through her own illnesses.  Realizing changes with age and has grief around not being able to care for family in same way.    Pt is supported by daughter and son and grandchildren, but is discovering how to let them care for her.   Pt expressed concern about the number of stairs in her house - she does not want to stay with daughter after discharge because she feels it would be giving up her independence.  Has lived in same house for nearly 40 years.  Feels she will be able to live on one story of her house until she can navigate the stairs.  Also concerned about caring for husband, who she reports is ill.   Pt visits shopping center during the week and goes to church on Sunday - does not want to have to give up this routine.    Chaplain provided spiritual and emotional support and grief work around life changes.  Provided empathic presence and prayed with pt.   Will continue to follow and provide support during admission.  Please page as needs arise.      02 /19/13 1800  Clinical Encounter Type  Visited With Patient  Visit Type Initial;Psychological support;Spiritual support;Social support  Referral From Chaplain  Consult/Referral To Chaplain  Spiritual Encounters  Spiritual Needs Emotional;Grief support;Prayer  Stress Factors  Patient Stress Factors Major life changes

## 2011-06-24 ENCOUNTER — Encounter: Payer: Self-pay | Admitting: Internal Medicine

## 2011-06-24 DIAGNOSIS — E119 Type 2 diabetes mellitus without complications: Secondary | ICD-10-CM | POA: Diagnosis not present

## 2011-06-24 DIAGNOSIS — R269 Unspecified abnormalities of gait and mobility: Secondary | ICD-10-CM | POA: Diagnosis not present

## 2011-06-24 DIAGNOSIS — M130 Polyarthritis, unspecified: Secondary | ICD-10-CM | POA: Diagnosis not present

## 2011-06-24 DIAGNOSIS — M2550 Pain in unspecified joint: Secondary | ICD-10-CM | POA: Diagnosis not present

## 2011-06-24 LAB — CBC
MCV: 83.4 fL (ref 78.0–100.0)
Platelets: 421 10*3/uL — ABNORMAL HIGH (ref 150–400)
RBC: 3.55 MIL/uL — ABNORMAL LOW (ref 3.87–5.11)
WBC: 9.1 10*3/uL (ref 4.0–10.5)

## 2011-06-24 LAB — BASIC METABOLIC PANEL
CO2: 28 mEq/L (ref 19–32)
Calcium: 9.4 mg/dL (ref 8.4–10.5)
Chloride: 105 mEq/L (ref 96–112)
GFR calc Af Amer: 70 mL/min — ABNORMAL LOW (ref >90)
Sodium: 142 mEq/L (ref 135–145)

## 2011-06-24 LAB — BODY FLUID CULTURE
Culture: NO GROWTH
Special Requests: NORMAL

## 2011-06-24 LAB — GLUCOSE, CAPILLARY: Glucose-Capillary: 128 mg/dL — ABNORMAL HIGH (ref 70–99)

## 2011-06-24 NOTE — Progress Notes (Signed)
Physical Therapy Treatment Patient Details Name: Sheila Morgan MRN: [id number] DOB: 04/12/1933 Today's Date: 06/24/2011  PT Assessment/Plan  PT - Assessment/Plan Comments on Treatment Session: Patient progressing with tolerance to weight bearing and able to be more independent on steps with cane.  Spouse with fall and now needing surgery for hip fracture.  Daughter states plans to take off work few days to help mom at home then pt's son can help in daytime and daughter in night. PT Plan: Discharge plan remains appropriate PT Frequency: Min 3X/week Follow Up Recommendations: Home health PT Equipment Recommended: Other (comment) (platform attachment for RUE for rolling walker) PT Goals  Acute Rehab PT Goals Pt will go Supine/Side to Sit: with modified independence PT Goal: Supine/Side to Sit - Progress: Progressing toward goal Pt will go Sit to Stand: with supervision PT Goal: Sit to Stand - Progress: Met Pt will go Stand to Sit: with supervision PT Goal: Stand to Sit - Progress: Met Pt will Ambulate: 51 - 150 feet;with supervision;with rolling walker;with other equipment (comment) (right UE platform) PT Goal: Ambulate - Progress: Met Pt will Go Up / Down Stairs: 6-9 stairs;with supervision;with rail(s);with cane PT Goal: Up/Down Stairs - Progress: Progressing toward goal  PT Treatment Precautions/Restrictions  Precautions Precautions: Fall Required Braces or Orthoses: Yes Other Brace/Splint: right wrist prefab support Restrictions Weight Bearing Restrictions: No Mobility (including Balance) Bed Mobility Sit to Supine: 5: Supervision Sit to Supine - Details (indicate cue type and reason): increased time and effort for her to get legs in bed Transfers Transfers: Yes Sit to Stand: 5: Supervision;With upper extremity assist;From bed;From chair/3-in-1 Sit to Stand Details (indicate cue type and reason): cue for hand placement Stand to Sit: 4: Min assist;With upper extremity  assist;To chair/3-in-1;To bed Ambulation/Gait Ambulation/Gait Assistance: 5: Supervision Ambulation/Gait Assistance Details (indicate cue type and reason): for safety Ambulation Distance (Feet): 300 Feet Assistive device: Rolling walker;Right platform walker Gait Pattern: Step-through pattern Stairs Assistance: 4: Min assist;5: Supervision Stairs Assistance Details (indicate cue type and reason): cues for sequence Stair Management Technique: One rail Right;With cane Number of Stairs: 10     Exercise    End of Session PT - End of Session Equipment Utilized During Treatment: Gait belt Activity Tolerance: Patient tolerated treatment well Patient left: with family/visitor present;in bed General Behavior During Session: [facility] for tasks performed Cognition: [facility] for tasks performed  [facility] 06/24/2011, 3:58 PM

## 2011-06-24 NOTE — Progress Notes (Signed)
Subjective: Watery diarrhea  Objective: Weight change:   Intake/Output Summary (Last 24 hours) at 06/24/11 1737 Last data filed at 06/24/11 0219  Gross per 24 hour  Intake    360 ml  Output    600 ml  Net   -240 ml    Filed Vitals:   06/24/11 1500  BP: 158/89  Pulse: 72  Temp: 98.1 F (36.7 C)  Resp: 18  General: Alert, awake, oriented x3, in no acute distress.  HEENT: No bruits, no goiter.  Heart: Regular rate and rhythm, without murmurs, rubs, gallops.  Lungs: Clear to auscultation bilaterally.  Abdomen: Soft, nontender, nondistended, positive bowel sounds.  Extremities: Right wrist with decreased swelling, nttp. Left foot with decreased erythema on the medial ankle, decreased swelling and tenderness palpation. Left knee swelling improved, decreased tenderness to palpation.    Lab Results: Results for orders placed during the hospital encounter of 06/19/11 (from the past 24 hour(s))  GLUCOSE, CAPILLARY     Status: Abnormal   Collection Time   06/23/11 10:21 PM      Component Value Range   Glucose-Capillary 149 (*) 70 - 99 (mg/dL)   Comment 1 Notify RN    BASIC METABOLIC PANEL     Status: Abnormal   Collection Time   06/24/11  5:10 AM      Component Value Range   Sodium 142  135 - 145 (mEq/L)   Potassium 3.6  3.5 - 5.1 (mEq/L)   Chloride 105  96 - 112 (mEq/L)   CO2 28  19 - 32 (mEq/L)   Glucose, Bld 124 (*) 70 - 99 (mg/dL)   BUN 25 (*) 6 - 23 (mg/dL)   Creatinine, Ser 1.61  0.50 - 1.10 (mg/dL)   Calcium 9.4  8.4 - 09.6 (mg/dL)   GFR calc non Af Amer 61 (*) >90 (mL/min)   GFR calc Af Amer 70 (*) >90 (mL/min)  CBC     Status: Abnormal   Collection Time   06/24/11  5:10 AM      Component Value Range   WBC 9.1  4.0 - 10.5 (K/uL)   RBC 3.55 (*) 3.87 - 5.11 (MIL/uL)   Hemoglobin 9.7 (*) 12.0 - 15.0 (g/dL)   HCT 04.5 (*) 40.9 - 46.0 (%)   MCV 83.4  78.0 - 100.0 (fL)   MCH 27.3  26.0 - 34.0 (pg)   MCHC 32.8  30.0 - 36.0 (g/dL)   RDW 81.1  91.4 - 78.2 (%)   Platelets 421 (*) 150 - 400 (K/uL)  GLUCOSE, CAPILLARY     Status: Abnormal   Collection Time   06/24/11  7:43 AM      Component Value Range   Glucose-Capillary 109 (*) 70 - 99 (mg/dL)   Comment 1 Notify RN    GLUCOSE, CAPILLARY     Status: Abnormal   Collection Time   06/24/11 11:55 AM      Component Value Range   Glucose-Capillary 128 (*) 70 - 99 (mg/dL)   Comment 1 Notify RN       Micro Results: Recent Results (from the past 240 hour(s))  GRAM STAIN     Status: Normal   Collection Time   06/21/11  1:50 PM      Component Value Range Status Comment   Specimen Description KNEE   Final    Special Requests Normal   Final    Gram Stain     Final    Value: GRAM STAIN PERFORMED ON CYTOSPIN  WBC PRESENT, PREDOMINANTLY PMN     NO ORGANISMS SEEN     Gram Stain Report Called to,Read Back By and Verified With:     T.SMITH RN AT 1444 ON 06/21/11 BY C.BONGEL   Report Status 06/21/2011 FINAL   Final   BODY FLUID CULTURE     Status: Normal   Collection Time   06/21/11  1:52 PM      Component Value Range Status Comment   Specimen Description KNEE   Final    Special Requests Normal   Final    Gram Stain     Final    Value: CYTOSPIN SLIDE WBC PRESENT, PREDOMINANTLY PMN     NO ORGANISMS SEEN     Gram Stain Report Called to,Read Back By and Verified With: Gram Stain Report Called to,Read Back By and Verified With: T.SMITH RN AT 1444 ON 06/21/11 BY C.BONGEL Performed by [facility]   Culture NO GROWTH 3 DAYS   Final    Report Status 06/24/2011 FINAL   Final     Studies/Results: Dg Knee Complete 4 Views Left  06/22/2011  *RADIOLOGY REPORT*  Clinical Data: Left knee pain and swelling.  LEFT KNEE - COMPLETE 4+ VIEW  Comparison: None.  Findings: There is no evidence of fracture or dislocation. Tricompartmental joint space narrowing is noted.  This is particularly prominent at the patellofemoral compartment, with associated cortical irregularity.  There is suggestion of associated  chondrocalcinosis.  Wall osteophytes are noted at the intercondylar notch.  A small knee joint effusion is noted.  Scattered vascular calcifications are seen.  IMPRESSION:  1.  No evidence of fracture or dislocation. 2.  Tricompartmental osteoarthritis, most prominent at the patellofemoral compartment, with suggestion of chondrocalcinosis. 3.  Small knee joint effusion seen. 4.  Scattered vascular calcifications noted.  Original Report Authenticated By: Tonia Ghent, M.D.   Dg Hand Complete Right  06/19/2011  *RADIOLOGY REPORT*  Clinical Data: Right hand pain.  RIGHT HAND - COMPLETE 3+ VIEW  Comparison: None  Findings: Degenerative changes are noted in the IP joints diffusely.  Early joint space narrowing within the MCP joints. There are moderate degenerative changes also in the first carpal metacarpal joint.  Most pronounced degenerative changes in the DIP joints diffusely and fourth PIP joint. No acute bony abnormality. Specifically, no fracture, subluxation, or dislocation.  Soft tissues are intact.  IMPRESSION: Moderate to advanced degenerative changes as above. No acute bony abnormality.  Original Report Authenticated By: Cyndie Chime, M.D.   Dg Foot 2 Views Left  06/19/2011  *RADIOLOGY REPORT*  Clinical Data: Pain, left foot swelling for 2 weeks no known injury  LEFT FOOT - 2 VIEW  Comparison: 02/06/2011  Findings: Two views of the left foot submitted.  No acute fracture or subluxation.  Dorsal soft tissue swelling is noted.  No periosteal reaction or bony erosion.  IMPRESSION: No acute fracture or subluxation.  Dorsal soft tissue swelling.  Original Report Authenticated By: Natasha Mead, M.D.   Medications: Scheduled Meds:   . amLODipine  5 mg Oral Daily  . aspirin  81 mg Oral Daily  . carvedilol  12.5 mg Oral BID WC  . colchicine  0.6 mg Oral BID  . enoxaparin  40 mg Subcutaneous Q24H  . furosemide  40 mg Oral Daily  . insulin aspart  0-9 Units Subcutaneous TID WC  . isosorbide mononitrate   30 mg Oral Daily  . lidocaine  50 mL Other Once  . losartan  100 mg  Oral Daily  . mulitivitamin with minerals  1 tablet Oral Daily  . pantoprazole  40 mg Oral Daily  . potassium chloride SA  20 mEq Oral Daily  . potassium chloride  40 mEq Oral Daily  . predniSONE  60 mg Oral Q breakfast  . simvastatin  20 mg Oral QHS  . vitamin B-12  1,000 mcg Oral Daily   Continuous Infusions:  PRN Meds:.morphine, traMADol  Assessment/Plan: Patient Active Hospital Problem List: #1 Polyarticular joint pain  Likely secondary to recurrent PMR, pseudogout vs gout vs inflammatory arthritis. Patient is status post attempted aspiration off the right wrist per hand surgery, with minimal aspirate. S/p L knee aspiration. Aspirate with neg gram stain to date and no crystal growth. ESR and CRP elevated. Significant clinical improvement on steriods. Continue colchicine. D/C empiric antibiotics. Ortho ff and appreciate input and rxcs.Will need rheumatological outpatient follow up. F/u with ortho as well. PT/OT.  #2 acute renal failure  Likely secondary to a prerenal azotemia. Resolved.  #3 hypokalemia  Replete  #4 hypertension  Stable. Norvasc, Coreg, Imdur, Cozaar, Lasix  #5 diabetes mellitus  Sliding scale insulin.  #6 polymyalgia rheumatica- See #1.  #7 coronary artery disease status post CABG and stent placement  Stable. Continue Coreg, Norvasc, Imdur, Cozaar, Lasix.  #8. Anemia  H/H stable. F/u as outpatient.  #9: Diarrhea: r/o c diff, PCR ordered.  #9 prophylaxis  PPI for GI. Lovenox for DVT.    LOS: 5 days   Trish Mancinelli 06/24/2011, 5:37 PM

## 2011-06-24 NOTE — Evaluation (Signed)
Occupational Therapy Evaluation Patient Details Name: Sheila Morgan MRN: [id number] DOB: [birth year] Today's Date: 06/24/2011 EV2 [id number] Problem List:  Patient Active Problem List  Diagnoses  . DIABETES MELLITUS, TYPE II  . HYPERLIPIDEMIA  . UNSPECIFIED ANEMIA  . HYPERTENSION  . Polymyalgia rheumatica  . OSTEOPOROSIS  . ALKALINE PHOSPHATASE, ELEVATED  . PROTEINURIA  . BLOOD IN STOOL, OCCULT  . ADVEF, DRUG/MEDICINAL/BIOLOGICAL SUBST NOS  . SKIN CANCER, HX OF  . CORONARY ARTERY BYPASS GRAFT, HX OF  . TOTAL KNEE REPLACEMENT, HX OF  . Joint pain  . ARF (acute renal failure)  . Hypokalemia  . Hypomagnesemia    Past Medical History:  Past Medical History  Diagnosis Date  . Hyperlipidemia   . Hypertension   . Cancer     skin  . Diabetes mellitus     2  . Arthritis   . Hiatal hernia   . Hemorrhoids   . Osteoporosis   . PONV (postoperative nausea and vomiting)    Past Surgical History:  Past Surgical History  Procedure Date  . Coronary artery bypass graft   . Coronary angioplasty with stent placement   . Total hip arthroplasty     OT Assessment/Plan/Recommendation OT Assessment Clinical Impression Statement: Pt appears to be mobilizing well and is able to use RUE in a functional manner when completing ADLs. Skilled OT recommended to maximize pt I to mod I level in prep for safe d/c home with HHOT. OT Recommendation/Assessment: Patient will need skilled OT in the acute care venue OT Problem List: Decreased strength;Decreased range of motion;Decreased activity tolerance;Decreased knowledge of use of DME or AE;Decreased safety awareness;Impaired UE functional use;Pain OT Therapy Diagnosis : Generalized weakness OT Plan OT Frequency: Min 2X/week OT Treatment/Interventions: Self-care/ADL training;Therapeutic activities;Therapeutic exercise;DME and/or AE instruction;Patient/family education OT Recommendation Follow Up Recommendations: Home health OT Equipment  Recommended: Other (comment) (platform attachment for her RW for RUE.) Individuals Consulted Consulted and Agree with Results and Recommendations: Patient OT Goals Acute Rehab OT Goals OT Goal Formulation: With patient ADL Goals Pt Will Perform Grooming: with modified independence;Standing at sink (X 3 tasks to improve standing activity tolerance.) ADL Goal: Grooming - Progress: Goal set today Pt Will Perform Lower Body Bathing: with modified independence;Sit to stand from chair;Sit to stand from bed;with adaptive equipment ADL Goal: Lower Body Bathing - Progress: Goal set today Pt Will Perform Lower Body Dressing: with modified independence;Sit to stand from bed;Sit to stand from chair ADL Goal: Lower Body Dressing - Progress: Goal set today Pt Will Transfer to Toilet: with modified independence;Ambulation;Regular height toilet;3-in-1 ADL Goal: Toilet Transfer - Progress: Goal set today Pt Will Perform Toileting - Clothing Manipulation: with modified independence;Standing ADL Goal: Toileting - Clothing Manipulation - Progress: Goal set today Pt Will Perform Toileting - Hygiene: with modified independence;Sit to stand from 3-in-1/toilet ADL Goal: Toileting - Hygiene - Progress: Goal set today Arm Goals Pt Will Perform AROM: Independently;Right upper extremity;to decrease contractures;2 sets;10 reps Arm Goal: AROM - Progress: Goal set today Pt Will Complete Theraputty Exer: Independently;Right upper extremity;Min resistance putty;to increase strength Arm Goal: Theraputty Exercises - Progress: Goal set today  OT Evaluation Precautions/Restrictions  Precautions Precautions: Fall Required Braces or Orthoses: Yes Other Brace/Splint: Right wrist prefab support Restrictions Weight Bearing Restrictions: No Prior Functioning Home Living Lives With: Spouse;Other (Comment) (who fell this am at the hospital visiting.) Type of Home: House Home Layout: Multi-level Alternate Level  Stairs-Rails: Right Alternate Level Stairs-Number of Steps: 7 Home Access: Level entry Bathroom Shower/Tub:  Tub/shower unit Bathroom Toilet: Standard Bathroom Accessibility: Yes How Accessible: Accessible via walker Home Adaptive Equipment: Tub transfer bench;Straight cane;Walker - rolling;Bedside commode/3-in-1 Prior Function Level of Independence: Independent with basic ADLs;Independent with transfers;Independent with homemaking with ambulation;Independent with gait Driving: No ADL ADL Grooming: Performed;Wash/dry hands;Wash/dry face;Brushing hair;Set up Where Assessed - Grooming: Sitting, chair;Supported Location manager Bathing: Performed;Chest;Right arm;Left arm;Abdomen;Set up Upper Body Bathing Details (indicate cue type and reason): Pt was able to use R arm in a functional manner to wash L underarm and apply deodorant. Where Assessed - Upper Body Bathing: Sitting, chair;Unsupported Lower Body Bathing: Minimal assistance;Performed Lower Body Bathing Details (indicate cue type and reason): min A needed to reach feet. Pt states she has a long handled sponge at home. Where Assessed - Lower Body Bathing: Sit to stand from chair Upper Body Dressing: Performed;Set up Where Assessed - Upper Body Dressing: Sitting, chair;Unsupported Lower Body Dressing: Performed;Minimal assistance Lower Body Dressing Details (indicate cue type and reason): Pt is able to doff socks but unable to don. Unsure if sock aid would be effective due to limited strength in RUE. Where Assessed - Lower Body Dressing: Sit to stand from chair Toilet Transfer: Performed;Supervision/safety Toilet Transfer Method: Proofreader: Regular height toilet;Grab bars Toileting - Clothing Manipulation: Supervision/safety;Performed Where Assessed - Toileting Clothing Manipulation: Sit to stand from 3-in-1 or toilet Toileting - Hygiene: Performed;Supervision/safety Where Assessed - Toileting Hygiene: Sit to  stand from 3-in-1 or toilet Tub/Shower Transfer: Not assessed Tub/Shower Transfer Method: Not assessed Equipment Used: Other (comment);Upper extremity splints (PFRW) ADL Comments: Pt tolerated well. Does fatigue quickly. Increased time needed for all tasks. Educated pt on keeping her fingers moving throughout the day. Vision/Perception  Vision - History Baseline Vision: Wears glasses all the time Visual History: Glaucoma Patient Visual Report: No change from baseline Cognition Cognition Arousal/Alertness: Awake/alert Overall Cognitive Status: Appears within functional limits for tasks assessed Sensation/Coordination Sensation Light Touch: Appears Intact Hot/Cold: Appears Intact Extremity Assessment RUE Assessment RUE Assessment: Exceptions to [facility] RUE AROM (degrees) Overall AROM Right Upper Extremity: Deficits RUE Overall AROM Comments: Shoulder/elbow WFL. Noted stiffness throughout wrist. Only able to oppose thumb to 1st digit. Able to form composite fist. RUE Strength RUE Overall Strength: Deficits RUE Overall Strength Comments: Shoulder/elbow WFL. Wrist NT due to pain. Weak grip. LUE Assessment LUE Assessment: Within Functional Limits Mobility  Bed Mobility Supine to Sit: 5: Supervision;With rails;HOB elevated (Comment degrees) Sitting - Scoot to Edge of Bed: 5: Supervision Transfers Sit to Stand: 5: Supervision;From toilet;From bed;With upper extremity assist Sit to Stand Details (indicate cue type and reason): cues for hand placement, technique. Stand to Sit: 5: Supervision;With upper extremity assist;To chair/3-in-1;To toilet;With armrests Stand to Sit Details: cues for hand placement, technique. Exercises   End of Session OT - End of Session Activity Tolerance: Patient tolerated treatment well Patient left: in chair;with call bell in reach General Behavior During Session: [facility] for tasks performed Cognition: [facility] for tasks performed   Payton Moder A, OTR/L  [phone] 06/24/2011, 9:27 AM

## 2011-06-25 DIAGNOSIS — M2550 Pain in unspecified joint: Secondary | ICD-10-CM | POA: Diagnosis not present

## 2011-06-25 DIAGNOSIS — E119 Type 2 diabetes mellitus without complications: Secondary | ICD-10-CM | POA: Diagnosis not present

## 2011-06-25 DIAGNOSIS — R269 Unspecified abnormalities of gait and mobility: Secondary | ICD-10-CM | POA: Diagnosis not present

## 2011-06-25 DIAGNOSIS — M130 Polyarthritis, unspecified: Secondary | ICD-10-CM | POA: Diagnosis not present

## 2011-06-25 LAB — BASIC METABOLIC PANEL
BUN: 25 mg/dL — ABNORMAL HIGH (ref 6–23)
CO2: 30 mEq/L (ref 19–32)
Chloride: 100 mEq/L (ref 96–112)
Creatinine, Ser: 0.94 mg/dL (ref 0.50–1.10)
Glucose, Bld: 93 mg/dL (ref 70–99)
Potassium: 3.5 mEq/L (ref 3.5–5.1)

## 2011-06-25 LAB — CBC
HCT: 33.6 % — ABNORMAL LOW (ref 36.0–46.0)
Hemoglobin: 10.9 g/dL — ABNORMAL LOW (ref 12.0–15.0)
MCV: 84 fL (ref 78.0–100.0)
RBC: 4 MIL/uL (ref 3.87–5.11)
WBC: 13.7 10*3/uL — ABNORMAL HIGH (ref 4.0–10.5)

## 2011-06-25 MED ORDER — PREDNISONE 20 MG PO TABS: 10.0000 mg | ORAL_TABLET | ORAL | Status: DC

## 2011-06-25 MED ORDER — AMLODIPINE BESYLATE 10 MG PO TABS
10.0000 mg | ORAL_TABLET | Freq: Every day | ORAL | Status: DC
  Filled 2011-06-25: qty 1

## 2011-06-25 MED ORDER — COLCHICINE 0.6 MG PO TABS: 0.6000 mg | ORAL_TABLET | Freq: Every day | ORAL | Status: DC

## 2011-06-25 MED ORDER — PREDNISONE 20 MG PO TABS
40.0000 mg | ORAL_TABLET | ORAL | Status: DC
  Filled 2011-06-25 (×2): qty 2

## 2011-06-25 MED ORDER — AMLODIPINE BESYLATE 10 MG PO TABS: 10.0000 mg | ORAL_TABLET | Freq: Every day | ORAL | Status: DC

## 2011-06-25 NOTE — Discharge Summary (Signed)
DISCHARGE SUMMARY  Sheila Morgan  MR#: [id number]  DOB:[birth year]  Date of Admission: 06/19/2011 Date of Discharge: 06/25/2011  Attending Physician:Delylah Stanczyk  Patient's [name] Sheila Ren, MD, MD  Consults:Treatment Team:  Tami Ribas, MD-  Discharge Diagnoses: Present on Admission:  .Joint pain .Gout flare .Gait abnormality .UNSPECIFIED ANEMIA .HYPERTENSION .ARF (acute renal failure) .Hypokalemia .Hypomagnesemia    Medication List  As of 06/25/2011 12:49 PM   TAKE these medications         amLODipine 10 MG tablet   Commonly known as: NORVASC   Take 1 tablet (10 mg total) by mouth daily.      aspirin 81 MG tablet   Take 81 mg by mouth daily.      CALCIUM 600-D PO   Take 1 tablet by mouth 2 (two) times daily.      carvedilol 12.5 MG tablet   Commonly known as: COREG   Take 12.5 mg by mouth 2 (two) times daily with a meal.      colchicine 0.6 MG tablet   Take 1 tablet (0.6 mg total) by mouth daily.      furosemide 40 MG tablet   Commonly known as: LASIX   Take 40 mg by mouth daily as needed. Taken with Potassium as needed for swelling.      isosorbide mononitrate 30 MG 24 hr tablet   Commonly known as: IMDUR   TAKE  1/2 TABLET BY MOUTH DAILY      losartan 100 MG tablet   Commonly known as: COZAAR   Take 1 tablet (100 mg total) by mouth daily.      mulitivitamin with minerals Tabs   Take 1 tablet by mouth daily.      ONE TOUCH ULTRA TEST test strip   Generic drug: glucose blood   CHECK BLOOD SUGAR TWICE A DAY.      onetouch ultrasoft lancets   CHECK BLOOD SUGAR TWICE A DAY.      potassium chloride SA 20 MEQ tablet   Commonly known as: K-DUR,KLOR-CON   TAKE 1 TABLET BY MOUTH DAILY IF FUROSEMIDE IS TAKEN      predniSONE 20 MG tablet   Commonly known as: DELTASONE   Take 0.5 tablets (10 mg total) by mouth daily.      simvastatin 20 MG tablet   Commonly known as: ZOCOR   Take 1 tablet (20 mg total) by mouth at bedtime.      traMADol 50 MG  tablet   Commonly known as: ULTRAM   Take 1 tablet (50 mg total) by mouth every 6 (six) hours as needed for pain.      vitamin B-12 1000 MCG tablet   Commonly known as: CYANOCOBALAMIN   Take 1,000 mcg by mouth daily.           Brief Admit Note: 78 year old female who has had 2 week history of right wrist swelling and pain in over the last for 5 days he started having left ankle swelling and pain. She has a history of gout in the past and also has a history of polymyalgia rheumatica. She went to see her primary care physician several days ago at which time she was only having the wrist pain and swelling the uric acid level was sent off which was basically normal. The patient was placed on a low dose of tramadol without any colchicine. And now again she is starting to have the same issue in her ankle and is having very much difficulty ambulating or  even getting up from a sitting position. She caught her PCP today and which point she was to come to the emergency department for admission   Hospital Course: #1 Polyarticular joint pain  Likely secondary to recurrent PMR, pseudogout vs gout vs inflammatory arthritis. Patient is status post attempted aspiration off the right wrist per hand surgery, with minimal aspirate. S/p L knee aspiration. Aspirate with neg gram stain to date and no crystal growth. ESR and CRP elevated. Significant clinical improvement on steriods. Hence she is being discharged on prednisone and colchicine. empiric antibiotics were discontinued.Will need rheumatological outpatient follow up.PT /OT recommended Home Health PT.  #2 acute renal failure  Likely secondary to a prerenal azotemia. Resolved.  #3 hypokalemia  RepleteD #4 hypertension  Stable. Norvasc, Coreg, Imdur, Cozaar, Lasix  #5 diabetes mellitus  Sliding scale insulin.  #6 polymyalgia rheumatica- See #1.  #7 coronary artery disease status post CABG and stent placement  Stable. Continue Coreg, Norvasc, Imdur,  Cozaar, Lasix. #8. Anemia  H/H stable. F/u as outpatient.  #9: Diarrhea:  Resolved. Leukocytosis : probably related to steroids.     Day of Discharge BP 180/73  Pulse 70  Temp(Src) 98.1 F (36.7 C) (Oral)  Resp 18  Ht 5\' 7"  (1.702 m)  Wt 71.668 kg (158 lb)  BMI 24.75 kg/m2  SpO2 98%  Physical Exam: General: Alert, awake, oriented x3, in no acute distress.  HEENT: No bruits, no goiter.  Heart: Regular rate and rhythm, without murmurs, rubs, gallops.  Lungs: Clear to auscultation bilaterally.  Abdomen: Soft, nontender, nondistended, positive bowel sounds.  Extremities: Right wrist with decreased swelling, nttp. Left foot with decreased erythema on the medial ankle, decreased swelling and tenderness palpation. Left knee swelling improved,  NO tenderness to palpation.   Results for orders placed during the hospital encounter of 06/19/11 (from the past 24 hour(s))  GLUCOSE, CAPILLARY     Status: Abnormal   Collection Time   06/24/11  6:14 PM      Component Value Range   Glucose-Capillary 153 (*) 70 - 99 (mg/dL)   Comment 1 Notify RN    GLUCOSE, CAPILLARY     Status: Abnormal   Collection Time   06/24/11  9:52 PM      Component Value Range   Glucose-Capillary 148 (*) 70 - 99 (mg/dL)   Comment 1 Notify RN    CBC     Status: Abnormal   Collection Time   06/25/11  4:54 AM      Component Value Range   WBC 13.7 (*) 4.0 - 10.5 (K/uL)   RBC 4.00  3.87 - 5.11 (MIL/uL)   Hemoglobin 10.9 (*) 12.0 - 15.0 (g/dL)   HCT 16.1 (*) 09.6 - 46.0 (%)   MCV 84.0  78.0 - 100.0 (fL)   MCH 27.3  26.0 - 34.0 (pg)   MCHC 32.4  30.0 - 36.0 (g/dL)   RDW 04.5  40.9 - 81.1 (%)   Platelets 537 (*) 150 - 400 (K/uL)  BASIC METABOLIC PANEL     Status: Abnormal   Collection Time   06/25/11  4:54 AM      Component Value Range   Sodium 139  135 - 145 (mEq/L)   Potassium 3.5  3.5 - 5.1 (mEq/L)   Chloride 100  96 - 112 (mEq/L)   CO2 30  19 - 32 (mEq/L)   Glucose, Bld 93  70 - 99 (mg/dL)   BUN 25 (*) 6  - 23 (mg/dL)  Creatinine, Ser 0.94  0.50 - 1.10 (mg/dL)   Calcium 9.8  8.4 - 78.2 (mg/dL)   GFR calc non Af Amer 57 (*) >90 (mL/min)   GFR calc Af Amer 67 (*) >90 (mL/min)  GLUCOSE, CAPILLARY     Status: Normal   Collection Time   06/25/11  7:31 AM      Component Value Range   Glucose-Capillary 87  70 - 99 (mg/dL)   Comment 1 Notify RN    GLUCOSE, CAPILLARY     Status: Normal   Collection Time   06/25/11 11:03 AM      Component Value Range   Glucose-Capillary 96  70 - 99 (mg/dL)    Disposition: Home with Home PT   Follow-up Appts: Discharge Orders    Future Orders Please Complete By Expires   Diet - low sodium heart healthy      Increase activity slowly      Discharge instructions      Comments:   Follow up with Rheumatology in one week.      Follow-up Information    Follow up with Marga Melnick, MD .         Tests Needing Follow-up: Rheumatology follow up.   Time spent in discharge (includes decision making & examination of pt): 40 minutes  Signed: Alanys Godino 06/25/2011, 12:49 PM

## 2011-06-25 NOTE — Progress Notes (Signed)
Talked to patient about DCP; Home Health list provided for the patient and patient chose [facility]. Norberta Keens RN with [facility] called for arrangements- rolling walker with platform to be delivered to the patient's room before discharge. Abelino Derrick RN, BSN, MHA.

## 2011-06-26 ENCOUNTER — Telehealth: Payer: Self-pay | Admitting: Internal Medicine

## 2011-06-26 NOTE — Telephone Encounter (Signed)
Patient's daughter states patient needs referral to rheumatologist. Dr at hospital told her she needs this referral within 1 week.

## 2011-06-27 DIAGNOSIS — I10 Essential (primary) hypertension: Secondary | ICD-10-CM | POA: Diagnosis not present

## 2011-06-27 DIAGNOSIS — E119 Type 2 diabetes mellitus without complications: Secondary | ICD-10-CM | POA: Diagnosis not present

## 2011-06-27 DIAGNOSIS — IMO0001 Reserved for inherently not codable concepts without codable children: Secondary | ICD-10-CM | POA: Diagnosis not present

## 2011-06-27 DIAGNOSIS — R269 Unspecified abnormalities of gait and mobility: Secondary | ICD-10-CM | POA: Diagnosis not present

## 2011-06-27 DIAGNOSIS — M353 Polymyalgia rheumatica: Secondary | ICD-10-CM | POA: Diagnosis not present

## 2011-06-27 DIAGNOSIS — M109 Gout, unspecified: Secondary | ICD-10-CM | POA: Diagnosis not present

## 2011-06-28 ENCOUNTER — Other Ambulatory Visit: Payer: Self-pay | Admitting: Internal Medicine

## 2011-06-28 DIAGNOSIS — M79609 Pain in unspecified limb: Secondary | ICD-10-CM

## 2011-06-29 ENCOUNTER — Telehealth: Payer: Self-pay | Admitting: Internal Medicine

## 2011-06-29 DIAGNOSIS — I10 Essential (primary) hypertension: Secondary | ICD-10-CM | POA: Diagnosis not present

## 2011-06-29 DIAGNOSIS — R269 Unspecified abnormalities of gait and mobility: Secondary | ICD-10-CM | POA: Diagnosis not present

## 2011-06-29 DIAGNOSIS — E119 Type 2 diabetes mellitus without complications: Secondary | ICD-10-CM | POA: Diagnosis not present

## 2011-06-29 DIAGNOSIS — IMO0001 Reserved for inherently not codable concepts without codable children: Secondary | ICD-10-CM | POA: Diagnosis not present

## 2011-06-29 DIAGNOSIS — M109 Gout, unspecified: Secondary | ICD-10-CM | POA: Diagnosis not present

## 2011-06-29 DIAGNOSIS — M353 Polymyalgia rheumatica: Secondary | ICD-10-CM | POA: Diagnosis not present

## 2011-06-29 NOTE — Telephone Encounter (Signed)
Use the dose prescribed in the hospital as they were actively treating her. Correct in EMR

## 2011-06-29 NOTE — Telephone Encounter (Signed)
I returned call to Donalee Citrin with [facility].  She states that the discharge orders from the hospital state Carvedilol 12.5 mg BID.  I explained for the patient to continue with that dose.  She will contact patient and let her know.

## 2011-06-29 NOTE — Telephone Encounter (Signed)
Donalee Citrin from [facility] called stating patient was just discharged from the hospital and needs clarification on dosage for Carvedilol. Dr. Frederik Pear prescribed dosage and hospital's dosage are not the same.

## 2011-07-01 ENCOUNTER — Ambulatory Visit (INDEPENDENT_AMBULATORY_CARE_PROVIDER_SITE_OTHER): Payer: Medicare Other | Admitting: Internal Medicine

## 2011-07-01 DIAGNOSIS — R269 Unspecified abnormalities of gait and mobility: Secondary | ICD-10-CM | POA: Diagnosis not present

## 2011-07-01 DIAGNOSIS — E876 Hypokalemia: Secondary | ICD-10-CM

## 2011-07-01 DIAGNOSIS — M353 Polymyalgia rheumatica: Secondary | ICD-10-CM

## 2011-07-01 DIAGNOSIS — M1120 Other chondrocalcinosis, unspecified site: Secondary | ICD-10-CM

## 2011-07-01 DIAGNOSIS — I10 Essential (primary) hypertension: Secondary | ICD-10-CM | POA: Diagnosis not present

## 2011-07-01 DIAGNOSIS — IMO0001 Reserved for inherently not codable concepts without codable children: Secondary | ICD-10-CM | POA: Diagnosis not present

## 2011-07-01 DIAGNOSIS — E119 Type 2 diabetes mellitus without complications: Secondary | ICD-10-CM | POA: Diagnosis not present

## 2011-07-01 DIAGNOSIS — M109 Gout, unspecified: Secondary | ICD-10-CM | POA: Diagnosis not present

## 2011-07-01 LAB — CREATININE, SERUM: Creatinine, Ser: 1 mg/dL (ref 0.4–1.2)

## 2011-07-01 LAB — BUN: BUN: 22 mg/dL (ref 6–23)

## 2011-07-01 LAB — POTASSIUM: Potassium: 3.8 mEq/L (ref 3.5–5.1)

## 2011-07-01 LAB — HEMOGLOBIN A1C: Hgb A1c MFr Bld: 6.6 % — ABNORMAL HIGH (ref 4.6–6.5)

## 2011-07-01 MED ORDER — ONETOUCH ULTRASOFT LANCETS MISC: Status: DC

## 2011-07-01 MED ORDER — GLUCOSE BLOOD VI STRP: ORAL_STRIP | Status: DC

## 2011-07-01 MED ORDER — COLCHICINE 0.6 MG PO TABS: 0.6000 mg | ORAL_TABLET | Freq: Every day | ORAL | Status: DC

## 2011-07-01 MED ORDER — PREDNISONE 20 MG PO TABS: 10.0000 mg | ORAL_TABLET | ORAL | Status: AC

## 2011-07-01 NOTE — Progress Notes (Signed)
Subjective:    Patient ID: Sheila Morgan, female    DOB: [birth year], 78 y.o.   MRN: [id number]  HPI Hospital records were reviewed. Fluid from the knee 06/21/11 revealed 23, 995 white cells but no organisms. Culture was  negative. Her daughter believes that she was told she had" pseudogout" & and polymyalgia rheumatica. The report on crystal evaluation is not in the computer. On 2/12 prior to entering the hospital her uric acid was 5; it had been 8.4 in October of 2012.  Review of her old paper chart revealed that she was presumed to have polymyalgia rheumatica in January 2008 based on clinical symptoms and elevated sedimentation rate. She did have a dramatic response to steroids but she had progressive hyperglycemia with elevated A1cs. Those notes are in the problem list.  On 2/15 sedimentation rate was 113; C. reactive protein was 27.393  She was discharged on colchicine 0.6 mg daily and prednisone 20 mg one half daily.  Prior to admission she was found to be hypokalemic. Review of her medications did not show any medication which could cause this except for furosemide which she was taking irregularly and only for pedal edema. When she took this  she would take potassium.    Review of Systems  Since discharge there is no dramatic improvement in her joint symptoms. She has residual stiffness of the right hand. She denies any fever, chills, or sweats.     Objective:   Physical Exam Gen.: Appears younger than stated age ; well-nourished in appearance. Alert, appropriate and cooperative throughout exam.   Eyes: No corneal or conjunctival inflammation noted. Pupils equal round reactive to light and accommodation. Extraocular motion intact. Vision grossly normal with lenses.    Neck: No deformities, masses, or tenderness noted. Range of motion slightly limited laterally Lungs: Normal respiratory effort; chest expands symmetrically. Lungs are clear to auscultation without rales, wheezes, or  increased work of breathing. Heart: Normal rate and rhythm. Normal S1 and S2. No gallop, click, or rub. S4 with slurring at  LSB ; no murmur. Abdomen:  abdomen soft and nontender. No masses, organomegaly or hernias noted.                                                     Musculoskeletal/extremities: No deformity or scoliosis noted of  the thoracic or lumbar spine. No clubbing, cyanosis, or edema noted. Range of motion  normal .Strength slightly decreased R hand.Mixed finger joints changes; flexion 5th L digit. Nail health  good. Vascular: Carotid, radial artery, dorsalis pedis and  posterior tibial pulses are full and equal. No bruits present. Neurologic: Alert and oriented x3. Deep tendon reflexes symmetrical and normal.          Skin: Intact without suspicious lesions or rashes. Lymph: No cervical, axillary lymphadenopathy present. Psych: Mood and affect are normal. Normally interactive  Assessment & Plan:  #1 probable pseudogout  #2 probable polymyalgia rheumatica; clinical picture is certainly atypical and unlikely presentation in January 2008.  #3 hyperglycemia aggravated by oral steroids  #4 hypokalemia possibly due to furosemide  Plan: Attempt will be made to clarify the crystal exam results 2/17 from the left knee. Rheumatology consult has been requested  Potassium and A1c will be checked.

## 2011-07-01 NOTE — Progress Notes (Signed)
  Subjective:    Patient ID: Sheila Morgan, female    DOB: 1933-03-05, 78 y.o.   MRN: [id number]  HPI synovial fluid analysis 2/17 revealed no crystals.    Review of Systems     Objective:   Physical Exam        Assessment & Plan:

## 2011-07-01 NOTE — Patient Instructions (Addendum)
.  Share results with the Rheumatologist  10/24/2011 Home Health Certification  & Plan of Care  For 2/23-4/23/13  reviewed & completed. Dr Vincenza Hews Anderson's 08/06/11 office visit reviewed. Discrepancies noted between EMR Med List & meds listed on form . Handwritten notes made on form  & request made for clarification as follows: Please document the name of the prescribing caregiver beside each medication listed on the patient's Plan of Care medication list.  This is to enhance continuity of care and prevent potential  adverse drug:drug interactions due to duplication of medications as branded and generic forms; repeated medication changes with hospitalizations and/or  at outpatient subspecialty visits; and lack of the EMR in some medical practices. Such risk is greatest with polypharmacy especially among  geriatric patients as has been documented repeatedly by Dr. Haig Prophet and his associates in multiple long-term studies.

## 2011-07-03 DIAGNOSIS — M353 Polymyalgia rheumatica: Secondary | ICD-10-CM | POA: Diagnosis not present

## 2011-07-03 DIAGNOSIS — M109 Gout, unspecified: Secondary | ICD-10-CM | POA: Diagnosis not present

## 2011-07-03 DIAGNOSIS — R269 Unspecified abnormalities of gait and mobility: Secondary | ICD-10-CM | POA: Diagnosis not present

## 2011-07-03 DIAGNOSIS — IMO0001 Reserved for inherently not codable concepts without codable children: Secondary | ICD-10-CM | POA: Diagnosis not present

## 2011-07-03 DIAGNOSIS — E119 Type 2 diabetes mellitus without complications: Secondary | ICD-10-CM | POA: Diagnosis not present

## 2011-07-03 DIAGNOSIS — I10 Essential (primary) hypertension: Secondary | ICD-10-CM | POA: Diagnosis not present

## 2011-07-06 DIAGNOSIS — M109 Gout, unspecified: Secondary | ICD-10-CM | POA: Diagnosis not present

## 2011-07-06 DIAGNOSIS — E119 Type 2 diabetes mellitus without complications: Secondary | ICD-10-CM | POA: Diagnosis not present

## 2011-07-06 DIAGNOSIS — I10 Essential (primary) hypertension: Secondary | ICD-10-CM | POA: Diagnosis not present

## 2011-07-06 DIAGNOSIS — IMO0001 Reserved for inherently not codable concepts without codable children: Secondary | ICD-10-CM | POA: Diagnosis not present

## 2011-07-06 DIAGNOSIS — R269 Unspecified abnormalities of gait and mobility: Secondary | ICD-10-CM | POA: Diagnosis not present

## 2011-07-06 DIAGNOSIS — M353 Polymyalgia rheumatica: Secondary | ICD-10-CM | POA: Diagnosis not present

## 2011-07-08 DIAGNOSIS — IMO0001 Reserved for inherently not codable concepts without codable children: Secondary | ICD-10-CM | POA: Diagnosis not present

## 2011-07-08 DIAGNOSIS — E119 Type 2 diabetes mellitus without complications: Secondary | ICD-10-CM | POA: Diagnosis not present

## 2011-07-08 DIAGNOSIS — M353 Polymyalgia rheumatica: Secondary | ICD-10-CM | POA: Diagnosis not present

## 2011-07-08 DIAGNOSIS — M109 Gout, unspecified: Secondary | ICD-10-CM | POA: Diagnosis not present

## 2011-07-08 DIAGNOSIS — I10 Essential (primary) hypertension: Secondary | ICD-10-CM | POA: Diagnosis not present

## 2011-07-08 DIAGNOSIS — R269 Unspecified abnormalities of gait and mobility: Secondary | ICD-10-CM | POA: Diagnosis not present

## 2011-07-10 DIAGNOSIS — M353 Polymyalgia rheumatica: Secondary | ICD-10-CM | POA: Diagnosis not present

## 2011-07-10 DIAGNOSIS — E119 Type 2 diabetes mellitus without complications: Secondary | ICD-10-CM | POA: Diagnosis not present

## 2011-07-10 DIAGNOSIS — R269 Unspecified abnormalities of gait and mobility: Secondary | ICD-10-CM | POA: Diagnosis not present

## 2011-07-10 DIAGNOSIS — IMO0001 Reserved for inherently not codable concepts without codable children: Secondary | ICD-10-CM | POA: Diagnosis not present

## 2011-07-10 DIAGNOSIS — I10 Essential (primary) hypertension: Secondary | ICD-10-CM | POA: Diagnosis not present

## 2011-07-10 DIAGNOSIS — M109 Gout, unspecified: Secondary | ICD-10-CM | POA: Diagnosis not present

## 2011-07-16 DIAGNOSIS — M2550 Pain in unspecified joint: Secondary | ICD-10-CM | POA: Diagnosis not present

## 2011-07-30 ENCOUNTER — Other Ambulatory Visit: Payer: Self-pay | Admitting: Internal Medicine

## 2011-07-30 DIAGNOSIS — M109 Gout, unspecified: Secondary | ICD-10-CM | POA: Diagnosis not present

## 2011-07-30 DIAGNOSIS — IMO0001 Reserved for inherently not codable concepts without codable children: Secondary | ICD-10-CM | POA: Diagnosis not present

## 2011-07-30 DIAGNOSIS — M353 Polymyalgia rheumatica: Secondary | ICD-10-CM | POA: Diagnosis not present

## 2011-07-30 DIAGNOSIS — M2550 Pain in unspecified joint: Secondary | ICD-10-CM | POA: Diagnosis not present

## 2011-07-30 DIAGNOSIS — M25549 Pain in joints of unspecified hand: Secondary | ICD-10-CM | POA: Diagnosis not present

## 2011-07-30 DIAGNOSIS — R269 Unspecified abnormalities of gait and mobility: Secondary | ICD-10-CM | POA: Diagnosis not present

## 2011-08-06 DIAGNOSIS — L738 Other specified follicular disorders: Secondary | ICD-10-CM | POA: Diagnosis not present

## 2011-08-12 DIAGNOSIS — Z79899 Other long term (current) drug therapy: Secondary | ICD-10-CM | POA: Diagnosis not present

## 2011-08-12 DIAGNOSIS — E782 Mixed hyperlipidemia: Secondary | ICD-10-CM | POA: Diagnosis not present

## 2011-08-20 DIAGNOSIS — I2510 Atherosclerotic heart disease of native coronary artery without angina pectoris: Secondary | ICD-10-CM | POA: Diagnosis not present

## 2011-08-20 DIAGNOSIS — E119 Type 2 diabetes mellitus without complications: Secondary | ICD-10-CM | POA: Diagnosis not present

## 2011-08-20 DIAGNOSIS — E782 Mixed hyperlipidemia: Secondary | ICD-10-CM | POA: Diagnosis not present

## 2011-08-20 DIAGNOSIS — I10 Essential (primary) hypertension: Secondary | ICD-10-CM | POA: Diagnosis not present

## 2011-09-07 DIAGNOSIS — M810 Age-related osteoporosis without current pathological fracture: Secondary | ICD-10-CM | POA: Diagnosis not present

## 2011-09-07 DIAGNOSIS — L738 Other specified follicular disorders: Secondary | ICD-10-CM | POA: Diagnosis not present

## 2011-09-07 DIAGNOSIS — M25569 Pain in unspecified knee: Secondary | ICD-10-CM | POA: Diagnosis not present

## 2011-09-09 ENCOUNTER — Other Ambulatory Visit: Payer: Self-pay | Admitting: Internal Medicine

## 2011-09-09 NOTE — Telephone Encounter (Signed)
Refill done.  

## 2011-12-05 ENCOUNTER — Other Ambulatory Visit: Payer: Self-pay | Admitting: Internal Medicine

## 2011-12-14 DIAGNOSIS — M1990 Unspecified osteoarthritis, unspecified site: Secondary | ICD-10-CM | POA: Diagnosis not present

## 2011-12-14 DIAGNOSIS — M25569 Pain in unspecified knee: Secondary | ICD-10-CM | POA: Diagnosis not present

## 2011-12-14 DIAGNOSIS — L738 Other specified follicular disorders: Secondary | ICD-10-CM | POA: Diagnosis not present

## 2012-01-02 DIAGNOSIS — Z23 Encounter for immunization: Secondary | ICD-10-CM | POA: Diagnosis not present

## 2012-01-21 ENCOUNTER — Other Ambulatory Visit: Payer: Self-pay | Admitting: General Practice

## 2012-01-21 ENCOUNTER — Other Ambulatory Visit: Payer: Self-pay | Admitting: Internal Medicine

## 2012-01-21 MED ORDER — AMLODIPINE BESYLATE 10 MG PO TABS: 10.0000 mg | ORAL_TABLET | Freq: Every day | ORAL | Status: DC

## 2012-02-09 DIAGNOSIS — M25569 Pain in unspecified knee: Secondary | ICD-10-CM | POA: Diagnosis not present

## 2012-02-09 DIAGNOSIS — IMO0002 Reserved for concepts with insufficient information to code with codable children: Secondary | ICD-10-CM | POA: Diagnosis not present

## 2012-02-09 DIAGNOSIS — M1710 Unilateral primary osteoarthritis, unspecified knee: Secondary | ICD-10-CM | POA: Diagnosis not present

## 2012-02-09 DIAGNOSIS — L738 Other specified follicular disorders: Secondary | ICD-10-CM | POA: Diagnosis not present

## 2012-02-09 DIAGNOSIS — M1990 Unspecified osteoarthritis, unspecified site: Secondary | ICD-10-CM | POA: Diagnosis not present

## 2012-02-24 DIAGNOSIS — M899 Disorder of bone, unspecified: Secondary | ICD-10-CM | POA: Diagnosis not present

## 2012-02-24 DIAGNOSIS — M949 Disorder of cartilage, unspecified: Secondary | ICD-10-CM | POA: Diagnosis not present

## 2012-02-24 DIAGNOSIS — Z1231 Encounter for screening mammogram for malignant neoplasm of breast: Secondary | ICD-10-CM | POA: Diagnosis not present

## 2012-03-04 DIAGNOSIS — R928 Other abnormal and inconclusive findings on diagnostic imaging of breast: Secondary | ICD-10-CM | POA: Diagnosis not present

## 2012-03-04 HISTORY — PX: NM MYOVIEW LTD: HXRAD82

## 2012-03-10 DIAGNOSIS — E119 Type 2 diabetes mellitus without complications: Secondary | ICD-10-CM | POA: Diagnosis not present

## 2012-03-10 DIAGNOSIS — H2510 Age-related nuclear cataract, unspecified eye: Secondary | ICD-10-CM | POA: Diagnosis not present

## 2012-03-14 DIAGNOSIS — E119 Type 2 diabetes mellitus without complications: Secondary | ICD-10-CM | POA: Diagnosis not present

## 2012-03-14 DIAGNOSIS — I10 Essential (primary) hypertension: Secondary | ICD-10-CM | POA: Diagnosis not present

## 2012-03-14 DIAGNOSIS — E782 Mixed hyperlipidemia: Secondary | ICD-10-CM | POA: Diagnosis not present

## 2012-03-14 DIAGNOSIS — I2510 Atherosclerotic heart disease of native coronary artery without angina pectoris: Secondary | ICD-10-CM | POA: Diagnosis not present

## 2012-03-28 DIAGNOSIS — E782 Mixed hyperlipidemia: Secondary | ICD-10-CM | POA: Diagnosis not present

## 2012-03-28 DIAGNOSIS — R5381 Other malaise: Secondary | ICD-10-CM | POA: Diagnosis not present

## 2012-03-28 DIAGNOSIS — R5383 Other fatigue: Secondary | ICD-10-CM | POA: Diagnosis not present

## 2012-03-28 DIAGNOSIS — Z79899 Other long term (current) drug therapy: Secondary | ICD-10-CM | POA: Diagnosis not present

## 2012-04-14 DIAGNOSIS — Z124 Encounter for screening for malignant neoplasm of cervix: Secondary | ICD-10-CM | POA: Diagnosis not present

## 2012-04-14 DIAGNOSIS — Z01419 Encounter for gynecological examination (general) (routine) without abnormal findings: Secondary | ICD-10-CM | POA: Diagnosis not present

## 2012-04-14 DIAGNOSIS — Z0000 Encounter for general adult medical examination without abnormal findings: Secondary | ICD-10-CM | POA: Diagnosis not present

## 2012-04-14 DIAGNOSIS — R82998 Other abnormal findings in urine: Secondary | ICD-10-CM | POA: Diagnosis not present

## 2012-04-23 ENCOUNTER — Other Ambulatory Visit: Payer: Self-pay | Admitting: Internal Medicine

## 2012-04-25 NOTE — Telephone Encounter (Signed)
Side Note: Patient will need to schedule CPX

## 2012-06-04 ENCOUNTER — Other Ambulatory Visit: Payer: Self-pay | Admitting: Internal Medicine

## 2012-06-23 DIAGNOSIS — M25569 Pain in unspecified knee: Secondary | ICD-10-CM | POA: Diagnosis not present

## 2012-06-23 DIAGNOSIS — M810 Age-related osteoporosis without current pathological fracture: Secondary | ICD-10-CM | POA: Diagnosis not present

## 2012-06-23 DIAGNOSIS — L738 Other specified follicular disorders: Secondary | ICD-10-CM | POA: Diagnosis not present

## 2012-06-23 DIAGNOSIS — M1990 Unspecified osteoarthritis, unspecified site: Secondary | ICD-10-CM | POA: Diagnosis not present

## 2012-07-18 ENCOUNTER — Telehealth: Payer: Self-pay | Admitting: Internal Medicine

## 2012-07-18 MED ORDER — CARVEDILOL 12.5 MG PO TABS: 12.5000 mg | ORAL_TABLET | Freq: Two times a day (BID) | ORAL | Status: DC

## 2012-07-18 NOTE — Telephone Encounter (Signed)
Refill: Carvedilol 12.5mg  tablet. Take 1/2 tablet by mouth twice daily. Stop taking amlodipine. Qty 30. Last fill 05-12-11

## 2012-07-20 ENCOUNTER — Other Ambulatory Visit: Payer: Self-pay | Admitting: Internal Medicine

## 2012-07-20 NOTE — Telephone Encounter (Signed)
Patient needs to schedule a CPX  

## 2012-07-27 ENCOUNTER — Telehealth: Payer: Self-pay | Admitting: Internal Medicine

## 2012-08-16 ENCOUNTER — Other Ambulatory Visit: Payer: Self-pay | Admitting: Internal Medicine

## 2012-08-17 NOTE — Telephone Encounter (Signed)
Called patient, patient informed she needs to schedule a yearly check up with Dr.Hopper, patient scheduled to be seen on 10/17/2012 @ 10:30 am, patient instructed to fast for any necessary labs, patient verbalized understanding

## 2012-09-06 DIAGNOSIS — R920 Mammographic microcalcification found on diagnostic imaging of breast: Secondary | ICD-10-CM | POA: Diagnosis not present

## 2012-09-08 DIAGNOSIS — E782 Mixed hyperlipidemia: Secondary | ICD-10-CM | POA: Diagnosis not present

## 2012-09-08 DIAGNOSIS — I25810 Atherosclerosis of coronary artery bypass graft(s) without angina pectoris: Secondary | ICD-10-CM | POA: Diagnosis not present

## 2012-09-08 DIAGNOSIS — R42 Dizziness and giddiness: Secondary | ICD-10-CM | POA: Diagnosis not present

## 2012-09-08 DIAGNOSIS — I10 Essential (primary) hypertension: Secondary | ICD-10-CM | POA: Diagnosis not present

## 2012-09-27 ENCOUNTER — Emergency Department (HOSPITAL_COMMUNITY)
Admission: EM | Admit: 2012-09-27 | Discharge: 2012-09-27 | Disposition: A | Discharge status: Home / Self Care | Payer: Medicare Other | Attending: Emergency Medicine | Admitting: Emergency Medicine

## 2012-09-27 ENCOUNTER — Emergency Department (HOSPITAL_COMMUNITY): Payer: Medicare Other

## 2012-09-27 ENCOUNTER — Encounter (HOSPITAL_COMMUNITY): Payer: Self-pay | Admitting: *Deleted

## 2012-09-27 DIAGNOSIS — S62521A Displaced fracture of distal phalanx of right thumb, initial encounter for closed fracture: Secondary | ICD-10-CM

## 2012-09-27 DIAGNOSIS — Z951 Presence of aortocoronary bypass graft: Secondary | ICD-10-CM | POA: Diagnosis not present

## 2012-09-27 DIAGNOSIS — Z85828 Personal history of other malignant neoplasm of skin: Secondary | ICD-10-CM | POA: Insufficient documentation

## 2012-09-27 DIAGNOSIS — Z9861 Coronary angioplasty status: Secondary | ICD-10-CM | POA: Diagnosis not present

## 2012-09-27 DIAGNOSIS — M810 Age-related osteoporosis without current pathological fracture: Secondary | ICD-10-CM | POA: Insufficient documentation

## 2012-09-27 DIAGNOSIS — R42 Dizziness and giddiness: Secondary | ICD-10-CM

## 2012-09-27 DIAGNOSIS — S62639A Displaced fracture of distal phalanx of unspecified finger, initial encounter for closed fracture: Secondary | ICD-10-CM | POA: Insufficient documentation

## 2012-09-27 DIAGNOSIS — M129 Arthropathy, unspecified: Secondary | ICD-10-CM | POA: Insufficient documentation

## 2012-09-27 DIAGNOSIS — Z7982 Long term (current) use of aspirin: Secondary | ICD-10-CM | POA: Insufficient documentation

## 2012-09-27 DIAGNOSIS — S8002XA Contusion of left knee, initial encounter: Secondary | ICD-10-CM

## 2012-09-27 DIAGNOSIS — S8000XA Contusion of unspecified knee, initial encounter: Secondary | ICD-10-CM | POA: Insufficient documentation

## 2012-09-27 DIAGNOSIS — S99919A Unspecified injury of unspecified ankle, initial encounter: Secondary | ICD-10-CM | POA: Diagnosis not present

## 2012-09-27 DIAGNOSIS — R55 Syncope and collapse: Secondary | ICD-10-CM | POA: Insufficient documentation

## 2012-09-27 DIAGNOSIS — Z96649 Presence of unspecified artificial hip joint: Secondary | ICD-10-CM | POA: Diagnosis not present

## 2012-09-27 DIAGNOSIS — Y9389 Activity, other specified: Secondary | ICD-10-CM | POA: Insufficient documentation

## 2012-09-27 DIAGNOSIS — Z79899 Other long term (current) drug therapy: Secondary | ICD-10-CM | POA: Insufficient documentation

## 2012-09-27 DIAGNOSIS — I10 Essential (primary) hypertension: Secondary | ICD-10-CM | POA: Diagnosis not present

## 2012-09-27 DIAGNOSIS — S99929A Unspecified injury of unspecified foot, initial encounter: Secondary | ICD-10-CM | POA: Diagnosis not present

## 2012-09-27 DIAGNOSIS — E119 Type 2 diabetes mellitus without complications: Secondary | ICD-10-CM | POA: Insufficient documentation

## 2012-09-27 DIAGNOSIS — S52501A Unspecified fracture of the lower end of right radius, initial encounter for closed fracture: Secondary | ICD-10-CM

## 2012-09-27 DIAGNOSIS — S8990XA Unspecified injury of unspecified lower leg, initial encounter: Secondary | ICD-10-CM | POA: Diagnosis not present

## 2012-09-27 DIAGNOSIS — Z8719 Personal history of other diseases of the digestive system: Secondary | ICD-10-CM | POA: Insufficient documentation

## 2012-09-27 DIAGNOSIS — S52599A Other fractures of lower end of unspecified radius, initial encounter for closed fracture: Secondary | ICD-10-CM | POA: Insufficient documentation

## 2012-09-27 DIAGNOSIS — M25569 Pain in unspecified knee: Secondary | ICD-10-CM | POA: Diagnosis not present

## 2012-09-27 DIAGNOSIS — Z8679 Personal history of other diseases of the circulatory system: Secondary | ICD-10-CM | POA: Insufficient documentation

## 2012-09-27 DIAGNOSIS — E785 Hyperlipidemia, unspecified: Secondary | ICD-10-CM | POA: Insufficient documentation

## 2012-09-27 DIAGNOSIS — Y929 Unspecified place or not applicable: Secondary | ICD-10-CM | POA: Insufficient documentation

## 2012-09-27 DIAGNOSIS — R296 Repeated falls: Secondary | ICD-10-CM | POA: Insufficient documentation

## 2012-09-27 LAB — CBC
MCH: 31.6 pg (ref 26.0–34.0)
MCHC: 35 g/dL (ref 30.0–36.0)
MCV: 90 fL (ref 78.0–100.0)
Platelets: 183 10*3/uL (ref 150–400)
RBC: 4.12 MIL/uL (ref 3.87–5.11)
RDW: 13.4 % (ref 11.5–15.5)

## 2012-09-27 LAB — COMPREHENSIVE METABOLIC PANEL
AST: 32 U/L (ref 0–37)
Alkaline Phosphatase: 75 U/L (ref 39–117)
CO2: 30 mEq/L (ref 19–32)
Chloride: 101 mEq/L (ref 96–112)
Creatinine, Ser: 1.02 mg/dL (ref 0.50–1.10)
GFR calc non Af Amer: 51 mL/min — ABNORMAL LOW (ref >90)
Potassium: 3 mEq/L — ABNORMAL LOW (ref 3.5–5.1)
Total Bilirubin: 0.4 mg/dL (ref 0.3–1.2)

## 2012-09-27 LAB — URINALYSIS, ROUTINE W REFLEX MICROSCOPIC
Bilirubin Urine: NEGATIVE
Ketones, ur: NEGATIVE mg/dL
Leukocytes, UA: NEGATIVE
Nitrite: NEGATIVE
Protein, ur: NEGATIVE mg/dL
Urobilinogen, UA: 0.2 mg/dL (ref 0.0–1.0)
pH: 7 (ref 5.0–8.0)

## 2012-09-27 MED ORDER — MORPHINE SULFATE 2 MG/ML IJ SOLN
2.0000 mg | Freq: Once | INTRAMUSCULAR | Status: AC
  Administered 2012-09-27: 2 mg via INTRAVENOUS
  Filled 2012-09-27: qty 1

## 2012-09-27 MED ORDER — HYDROCODONE-ACETAMINOPHEN 5-325 MG PO TABS: 1.0000 | ORAL_TABLET | Freq: Four times a day (QID) | ORAL | Status: DC | PRN

## 2012-09-27 MED ORDER — FENTANYL CITRATE 0.05 MG/ML IJ SOLN
50.0000 ug | Freq: Once | INTRAMUSCULAR | Status: AC
  Administered 2012-09-27: 50 ug via INTRAVENOUS
  Filled 2012-09-27: qty 2

## 2012-09-27 MED ORDER — POTASSIUM CHLORIDE ER 20 MEQ PO TBCR: EXTENDED_RELEASE_TABLET | ORAL | Status: DC

## 2012-09-27 MED ORDER — ONDANSETRON HCL 4 MG/2ML IJ SOLN: 4.0000 mg | Freq: Once | INTRAMUSCULAR | Status: DC

## 2012-09-27 MED ORDER — MORPHINE SULFATE 4 MG/ML IJ SOLN
4.0000 mg | Freq: Once | INTRAMUSCULAR | Status: AC
  Administered 2012-09-27: 4 mg via INTRAVENOUS
  Filled 2012-09-27: qty 1

## 2012-09-27 MED ORDER — POTASSIUM CHLORIDE 10 MEQ/100ML IV SOLN
10.0000 meq | Freq: Once | INTRAVENOUS | Status: AC
  Administered 2012-09-27: 10 meq via INTRAVENOUS
  Filled 2012-09-27: qty 100

## 2012-09-27 MED ORDER — ONDANSETRON HCL 4 MG/2ML IJ SOLN
4.0000 mg | Freq: Once | INTRAMUSCULAR | Status: AC
  Administered 2012-09-27: 4 mg via INTRAVENOUS
  Filled 2012-09-27: qty 2

## 2012-09-27 NOTE — ED Notes (Signed)
Patient is alert and oriented x3.  She is complaining of pain and bruising to the right breast, right hand and wrist and left knee. She states that she became very dizzy at home and the room started spinning when she fell.  She has had a history of this issue Before.  Notable deformity to the left hand.  She rates her pain at 8 of 10

## 2012-09-27 NOTE — ED Provider Notes (Signed)
History     CSN: [id number]  Arrival date & time 09/27/12  1478   First MD Initiated Contact with Patient 09/27/12 [phone]      Chief Complaint  Patient presents with  . Fall  . Wrist Injury    (Consider location/radiation/quality/duration/timing/severity/associated sxs/prior treatment) Patient is a 79 y.o. female presenting with fall and wrist injury. The history is provided by the patient.  Fall Pertinent negatives include no chest pain, no abdominal pain, no headaches and no shortness of breath.  Wrist Injury Associated symptoms: no back pain, no fever and no neck pain   s/p fall at home this am. Pt awoke per normal routine this morning, got up out of bed, felt lightheaded/faint, tried to walk and fell. Denies loc but felt lightheaded/dizzy. No head injury. Hit right breast, and c/o right wrist and hand pain, also c/o left knee.  No numbness/weakness. No neck or back pain. Wrist pain, constant, dull, mod-sev worse w palpation. Skin intact.     Past Medical History  Diagnosis Date  . Hyperlipidemia   . Hypertension   . Cancer     skin  . Diabetes mellitus     2  . Arthritis   . Hiatal hernia   . Hemorrhoids   . Osteoporosis   . PONV (postoperative nausea and vomiting)     Past Surgical History  Procedure Laterality Date  . Coronary artery bypass graft    . Coronary angioplasty with stent placement    . Total hip arthroplasty      Family History  Problem Relation Age of Onset  . Heart disease Father   . Cancer Sister     breast  . Diabetes Sister   . Heart disease Brother   . Heart disease Brother   . Heart disease Brother   . Diabetes Sister     History  Substance Use Topics  . Smoking status: Never Smoker   . Smokeless tobacco: Never Used  . Alcohol Use: No    OB History   Grav Para Term Preterm Abortions TAB SAB Ect Mult Living                  Review of Systems  Constitutional: Negative for fever and chills.  HENT: Negative for neck pain.    Eyes: Negative for visual disturbance.  Respiratory: Negative for shortness of breath.   Cardiovascular: Negative for chest pain.  Gastrointestinal: Negative for vomiting and abdominal pain.  Genitourinary: Negative for flank pain.  Musculoskeletal: Negative for back pain.  Skin: Negative for wound.  Neurological: Negative for weakness, numbness and headaches.  Hematological: Does not bruise/bleed easily.  Psychiatric/Behavioral: Negative for confusion.    Allergies  Review of patient's allergies indicates no known allergies.  Home Medications   Current Outpatient Rx  Name  Route  Sig  Dispense  Refill  . amLODipine (NORVASC) 10 MG tablet      TAKE 1 TABLET BY MOUTH DAILY   90 tablet   0   . aspirin 81 MG tablet   Oral   Take 81 mg by mouth daily.           . Calcium Carbonate-Vitamin D (CALCIUM 600-D PO)   Oral   Take 1 tablet by mouth 2 (two) times daily.          . carvedilol (COREG) 12.5 MG tablet   Oral   Take 1 tablet (12.5 mg total) by mouth 2 (two) times daily with a meal.  180 tablet   1   . EXPIRED: colchicine 0.6 MG tablet   Oral   Take 1 tablet (0.6 mg total) by mouth daily.   30 tablet   0   . furosemide (LASIX) 40 MG tablet   Oral   Take 40 mg by mouth daily as needed. Taken with Potassium as needed for swelling.         Marland Kitchen EXPIRED: furosemide (LASIX) 40 MG tablet   Oral   Take 40 mg by mouth daily. 1/2-1 by mouth daily as needed for swelling         . glucose blood (ONE TOUCH ULTRA TEST) test strip      Use as instructed   100 each   4   . isosorbide mononitrate (IMDUR) 30 MG 24 hr tablet      TAKE  1/2 TABLET BY MOUTH DAILY   15 tablet   1   . Lancets (ONETOUCH ULTRASOFT) lancets      Use as instructed   100 each   4   . losartan (COZAAR) 100 MG tablet      TAKE 1 TABLET BY MOUTH DAILY   90 tablet   1   . Multiple Vitamin (MULITIVITAMIN WITH MINERALS) TABS   Oral   Take 1 tablet by mouth daily.         .  potassium chloride SA (K-DUR,KLOR-CON) 20 MEQ tablet      TAKE 1 TABLET BY MOUTH DAILY IF FUROSEMIDE IS TAKEN   30 tablet   1   . simvastatin (ZOCOR) 20 MG tablet      TAKE 1 TABLET BY MOUTH EVERY NIGHT AT BEDTIME   90 tablet   2   . traMADol (ULTRAM) 50 MG tablet   Oral   Take 50 mg by mouth every 6 (six) hours as needed.         . vitamin B-12 (CYANOCOBALAMIN) 1000 MCG tablet   Oral   Take 1,000 mcg by mouth daily.             BP 161/77  Pulse 71  Temp(Src) 98 F (36.7 C) (Oral)  Resp 18  Ht 5\' 7"  (1.702 m)  Wt 168 lb (76.204 kg)  BMI 26.31 kg/m2  SpO2 95%  Physical Exam  Nursing note and vitals reviewed. Constitutional: She is oriented to person, place, and time. She appears well-developed and well-nourished. No distress.  HENT:  Head: Atraumatic.  Eyes: Conjunctivae are normal. Pupils are equal, round, and reactive to light. No scleral icterus.  Neck: Neck supple. No tracheal deviation present.  No bruit  Cardiovascular: Normal rate, regular rhythm, normal heart sounds and intact distal pulses.   Pulmonary/Chest: Effort normal and breath sounds normal. No respiratory distress.  Contusion right breast, no other chest wall tenderness, no crepitus.   Abdominal: Soft. Normal appearance and bowel sounds are normal. She exhibits no distension. There is no tenderness.  Musculoskeletal:  Right wrist deformity, sts/tenderness. Tenderness right thumb w mild sts. Skin intact. Radial pulse 2+. CTLS spine, non tender, aligned, no step off. Left knee mild sts and tenderness, knee grossly stable, distal pulses palp.    Neurological: She is alert and oriented to person, place, and time. No cranial nerve deficit.  Motor intact bil.   Skin: Skin is warm and dry. No rash noted.  Psychiatric: She has a normal mood and affect.    ED Course  Procedures (including critical care time)   Results for orders placed during  the hospital encounter of 09/27/12  COMPREHENSIVE  METABOLIC PANEL      Result Value Range   Sodium 140  135 - 145 mEq/L   Potassium 3.0 (*) 3.5 - 5.1 mEq/L   Chloride 101  96 - 112 mEq/L   CO2 30  19 - 32 mEq/L   Glucose, Bld 130 (*) 70 - 99 mg/dL   BUN 18  6 - 23 mg/dL   Creatinine, Ser 1.61  0.50 - 1.10 mg/dL   Calcium 9.8  8.4 - 09.6 mg/dL   Total Protein 6.8  6.0 - 8.3 g/dL   Albumin 3.5  3.5 - 5.2 g/dL   AST 32  0 - 37 U/L   ALT 49 (*) 0 - 35 U/L   Alkaline Phosphatase 75  39 - 117 U/L   Total Bilirubin 0.4  0.3 - 1.2 mg/dL   GFR calc non Af Amer 51 (*) >90 mL/min   GFR calc Af Amer 59 (*) >90 mL/min  CBC      Result Value Range   WBC 11.5 (*) 4.0 - 10.5 K/uL   RBC 4.12  3.87 - 5.11 MIL/uL   Hemoglobin 13.0  12.0 - 15.0 g/dL   HCT 04.5  40.9 - 81.1 %   MCV 90.0  78.0 - 100.0 fL   MCH 31.6  26.0 - 34.0 pg   MCHC 35.0  30.0 - 36.0 g/dL   RDW 91.4  78.2 - 95.6 %   Platelets 183  150 - 400 K/uL  URINALYSIS, ROUTINE W REFLEX MICROSCOPIC      Result Value Range   Color, Urine YELLOW  YELLOW   APPearance CLEAR  CLEAR   Specific Gravity, Urine 1.013  1.005 - 1.030   pH 7.0  5.0 - 8.0   Glucose, UA NEGATIVE  NEGATIVE mg/dL   Hgb urine dipstick NEGATIVE  NEGATIVE   Bilirubin Urine NEGATIVE  NEGATIVE   Ketones, ur NEGATIVE  NEGATIVE mg/dL   Protein, ur NEGATIVE  NEGATIVE mg/dL   Urobilinogen, UA 0.2  0.0 - 1.0 mg/dL   Nitrite NEGATIVE  NEGATIVE   Leukocytes, UA NEGATIVE  NEGATIVE   Dg Wrist Complete Right  09/27/2012   **ADDENDUM** CREATED: 09/27/2012 07:39:21  This addendum is given for the purpose of noting that there has been progressive joint space narrowing about the carpal bones appearing worst at the first [facility] and scaphoid presumed trapezoid joints.  **END ADDENDUM** SIGNED BY: Maisie Fus L. Maricela Curet, M.D.  09/27/2012   *RADIOLOGY REPORT*  Clinical Data: Fall, right wrist pain.  RIGHT WRIST - COMPLETE 3+ VIEW  Comparison: Plain films right hand 06/19/2011.  Findings: The patient has an impacted fracture of the distal  radius.  Accessory ossicle off the ulnar styloid is again seen. Marked degenerative change is present about the carpus. Chondrocalcinosis of the triangular fibrocartilage is identified. Soft tissue swelling about the wrist is noted.  IMPRESSION: Impacted distal radius fracture with associated soft tissue swelling.   Original Report Authenticated By: Holley Dexter, M.D.   Dg Knee 2 Views Left  09/27/2012   *RADIOLOGY REPORT*  Clinical Data: Fall.  Knee pain.  LEFT KNEE - 1-2 VIEW  Comparison: Plain films 02/09/2012.  Findings: No acute bony or joint abnormality is identified.  The patient has advanced tricompartmental degenerative disease appearing worst in the patellofemoral and lateral compartments.  No joint effusion.  IMPRESSION:  1.  No acute finding. 2.  Advanced tricompartmental osteoarthritis.   Original Report Authenticated By: Holley Dexter, M.D.  Dg Hand Complete Right  09/27/2012   *RADIOLOGY REPORT*  Clinical Data: Fall, pain.  RIGHT HAND - COMPLETE 3+ VIEW  Technique: Three views of the right hand are provided.  Comparison:  Plain films 09/27/2012.  Findings: The patient has an acute fracture of the distal phalanx of the thumb.  The fracture is through the proximal diaphysis. Impacted fracture of the distal radius is noted is seen as on plain films of the wrist this same date.  There has been marked progression of second MCP joint space narrowing.  There are erosions in the second metacarpal head.  Progressive joint space narrowing with erosive change is also identified about the first MCP joint and carpus.  Calcification of the triangular fibrocartilage is noted.  Multifocal osteoarthritis appears worst at the PIP joint of the ring finger.  IMPRESSION:  1.  Acute fracture distal phalanx of the thumb. 2.  Impacted distal radius fracture. 3.  Changes about the MCP joints and carpus worrisome for inflammatory arthropathy such as rheumatoid.   Original Report Authenticated By: Holley Dexter, M.D.      MDM  Iv ns. Morphine iv. zofran iv.  Xrays.  Reviewed nursing notes and prior charts for additional history.   k low, ?whether to OR, so will hold po meds for now, k iv.    Date: 09/27/2012  Rate: 63  Rhythm: normal sinus rhythm  QRS Axis: normal  Intervals: normal  ST/T Wave abnormalities: normal  Conduction Disutrbances:none  Narrative Interpretation:   Old EKG Reviewed: unchanged   Discussed pt with Dr Amanda Pea, incl thumb and impacted distal radius fx/closed - he indicates sugar tong and he will see in office this Thursday at 8 am.    Recheck pain improved.   Radial pulse 2+.   Ice/elevate.   Given episode lightheadedness/dizziness (resolved throughout period obs in ed), and note prior similar episodes, will have f/u card for possible holter/outpt monitor.   Recheck, alert, content. Pain controlled. Appears stable for d/c.   Will give rx pain and kcl po for home.       Suzi Roots, MD 09/27/12 1022

## 2012-09-29 ENCOUNTER — Ambulatory Visit (INDEPENDENT_AMBULATORY_CARE_PROVIDER_SITE_OTHER): Payer: Medicare Other | Admitting: Internal Medicine

## 2012-09-29 ENCOUNTER — Encounter: Payer: Self-pay | Admitting: Internal Medicine

## 2012-09-29 VITALS — BP 142/72 | HR 75 | Temp 97.1°F | Ht 67.0 in | Wt 171.0 lb

## 2012-09-29 DIAGNOSIS — S62639A Displaced fracture of distal phalanx of unspecified finger, initial encounter for closed fracture: Secondary | ICD-10-CM | POA: Diagnosis not present

## 2012-09-29 DIAGNOSIS — S52599A Other fractures of lower end of unspecified radius, initial encounter for closed fracture: Secondary | ICD-10-CM | POA: Diagnosis not present

## 2012-09-29 DIAGNOSIS — R42 Dizziness and giddiness: Secondary | ICD-10-CM

## 2012-09-29 MED ORDER — MECLIZINE HCL 25 MG PO CHEW: 1.0000 | CHEWABLE_TABLET | Freq: Three times a day (TID) | ORAL | Status: DC | PRN

## 2012-09-29 NOTE — Patient Instructions (Signed)
Vertigo Vertigo means you feel like you or your surroundings are moving when they are not. Vertigo can be dangerous if it occurs when you are at work, driving, or performing difficult activities.  CAUSES  Vertigo occurs when there is a conflict of signals sent to your brain from the visual and sensory systems in your body. There are many different causes of vertigo, including:  Infections, especially in the inner ear.  A bad reaction to a drug or misuse of alcohol and medicines.  Withdrawal from drugs or alcohol.  Rapidly changing positions, such as lying down or rolling over in bed.  A migraine headache.  Decreased blood flow to the brain.  Increased pressure in the brain from a head injury, infection, tumor, or bleeding. SYMPTOMS  You may feel as though the world is spinning around or you are falling to the ground. Because your balance is upset, vertigo can cause nausea and vomiting. You may have involuntary eye movements (nystagmus). DIAGNOSIS  Vertigo is usually diagnosed by physical exam. If the cause of your vertigo is unknown, your caregiver may perform imaging tests, such as an MRI scan (magnetic resonance imaging). TREATMENT  Most cases of vertigo resolve on their own, without treatment. Depending on the cause, your caregiver may prescribe certain medicines. If your vertigo is related to body position issues, your caregiver may recommend movements or procedures to correct the problem. In rare cases, if your vertigo is caused by certain inner ear problems, you may need surgery. HOME CARE INSTRUCTIONS   Follow your caregiver's instructions.  Avoid driving.  Avoid operating heavy machinery.  Avoid performing any tasks that would be dangerous to you or others during a vertigo episode.  Tell your caregiver if you notice that certain medicines seem to be causing your vertigo. Some of the medicines used to treat vertigo episodes can actually make them worse in some people. SEEK  IMMEDIATE MEDICAL CARE IF:   Your medicines do not relieve your vertigo or are making it worse.  You develop problems with talking, walking, weakness, or using your arms, hands, or legs.  You develop severe headaches.  Your nausea or vomiting continues or gets worse.  You develop visual changes.  A family member notices behavioral changes.  Your condition gets worse. MAKE SURE YOU:  Understand these instructions.  Will watch your condition.  Will get help right away if you are not doing well or get worse. Document Released: 01/28/2005 Document Revised: 07/13/2011 Document Reviewed: 11/06/2010 [employer] Patient Information 2014 [employer].  

## 2012-09-29 NOTE — Progress Notes (Signed)
Subjective:    Patient ID: Sheila Morgan, female    DOB: Apr 02, 1933, 79 y.o.   MRN: [id number]  HPI  Pt presents to the clinic today with c/o dizziness x 2 days. She has experienced a fall because of this resulting a right wrist and thumb fracture. She feels like the room is spinning. This feeling comes and goes. It is not worse with position changes. It has gotten better over the last day. She denies chest pain, chest tightness and shortness of breath. EKG was done 09/28/12, reviewed and unchanged from prior.  Review of Systems      Past Medical History  Diagnosis Date  . Hyperlipidemia   . Hypertension   . Cancer     skin  . Diabetes mellitus     2  . Arthritis   . Hiatal hernia   . Hemorrhoids   . Osteoporosis   . PONV (postoperative nausea and vomiting)     Current Outpatient Prescriptions  Medication Sig Dispense Refill  . alendronate (FOSAMAX) 70 MG tablet Take 70 mg by mouth every 7 (seven) days. Take with a full glass of water on an empty stomach. saturday      . amLODipine (NORVASC) 10 MG tablet TAKE 1 TABLET BY MOUTH DAILY  90 tablet  0  . aspirin 81 MG tablet Take 81 mg by mouth daily.        . Calcium Carbonate-Vitamin D (CALCIUM 600-D PO) Take 1 tablet by mouth 2 (two) times daily.       . carvedilol (COREG) 12.5 MG tablet Take 1 tablet (12.5 mg total) by mouth 2 (two) times daily with a meal.  180 tablet  1  . colchicine (COLCRYS) 0.6 MG tablet Take 0.6 mg by mouth daily.      Marland Kitchen glucose blood (ONE TOUCH ULTRA TEST) test strip Use as instructed  100 each  4  . HYDROcodone-acetaminophen (NORCO/VICODIN) 5-325 MG per tablet Take 1-2 tablets by mouth every 6 (six) hours as needed for pain.  20 tablet  0  . isosorbide mononitrate (IMDUR) 30 MG 24 hr tablet TAKE  1/2 TABLET BY MOUTH DAILY  15 tablet  1  . Lancets (ONETOUCH ULTRASOFT) lancets Use as instructed  100 each  4  . losartan (COZAAR) 100 MG tablet TAKE 1 TABLET BY MOUTH DAILY  90 tablet  1  . Multiple Vitamin  (MULITIVITAMIN WITH MINERALS) TABS Take 1 tablet by mouth daily.      . potassium chloride 20 MEQ TBCR Take one tablet bid x 2 days, then take one tablet a day  20 tablet  0  . predniSONE (DELTASONE) 5 MG tablet Take 5 mg by mouth daily.      . simvastatin (ZOCOR) 40 MG tablet Take 40 mg by mouth every evening.      . vitamin B-12 (CYANOCOBALAMIN) 1000 MCG tablet Take 1,000 mcg by mouth daily.         No current facility-administered medications for this visit.    No Known Allergies  Family History  Problem Relation Age of Onset  . Heart disease Father   . Cancer Sister     breast  . Diabetes Sister   . Heart disease Brother   . Heart disease Brother   . Heart disease Brother   . Diabetes Sister     History   Social History  . Marital Status: Married    Spouse Name: N/A    Number of Children: N/A  . Years  of Education: N/A   Occupational History  . Not on file.   Social History Main Topics  . Smoking status: Never Smoker   . Smokeless tobacco: Never Used  . Alcohol Use: No  . Drug Use: No  . Sexually Active: Not Currently   Other Topics Concern  . Not on file   Social History Narrative  . No narrative on file     Constitutional: Denies fever, malaise, fatigue, headache or abrupt weight changes.  HEENT: Denies eye pain, eye redness, ear pain, ringing in the ears, wax buildup, runny nose, nasal congestion, bloody nose, or sore throat. Respiratory: Denies difficulty breathing, shortness of breath, cough or sputum production.   Cardiovascular: Denies chest pain, chest tightness, palpitations or swelling in the hands or feet.  Neurological: Pt reports dizziness. Denies difficulty with memory, difficulty with speech or problems with balance and coordination.   No other specific complaints in a complete review of systems (except as listed in HPI above).  Objective:   Physical Exam   BP 142/72  Pulse 75  Temp(Src) 97.1 F (36.2 C) (Oral)  Ht 5\' 7"  (1.702 m)   Wt 171 lb (77.565 kg)  BMI 26.78 kg/m2  SpO2 96% Wt Readings from Last 3 Encounters:  09/29/12 171 lb (77.565 kg)  09/27/12 168 lb (76.204 kg)  07/01/11 150 lb (68.04 kg)    General: Appears her stated age, well developed, well nourished in NAD. HEENT: Head: normal shape and size; Eyes: sclera white, no icterus, conjunctiva pink, PERRLA and EOMs intact; Ears: Tm's gray and intact, normal light reflex; Nose: mucosa pink and moist, septum midline; Throat/Mouth: Teeth present, mucosa pink and moist, no exudate, lesions or ulcerations noted.  Cardiovascular: Normal rate and rhythm. S1,S2 noted.  No murmur, rubs or gallops noted. No JVD or BLE edema. No carotid bruits noted. Pulmonary/Chest: Normal effort and positive vesicular breath sounds. No respiratory distress. No wheezes, rales or ronchi noted.  Neurological: Alert and oriented. Cranial nerves II-XII intact. Coordination normal. +DTRs bilaterally. Negative Rhomberg.     Assessment & Plan:   Dizziness likely due to vertigo:  eRx for Meclizine Try this for 1 week, if not resolved please make appointment with your cardiologist Drink plenty of fluids over the next 24-48 hours

## 2012-09-30 ENCOUNTER — Ambulatory Visit: Payer: Medicare Other | Admitting: Internal Medicine

## 2012-10-06 DIAGNOSIS — N644 Mastodynia: Secondary | ICD-10-CM | POA: Diagnosis not present

## 2012-10-10 DIAGNOSIS — S52599A Other fractures of lower end of unspecified radius, initial encounter for closed fracture: Secondary | ICD-10-CM | POA: Diagnosis not present

## 2012-10-17 ENCOUNTER — Ambulatory Visit (INDEPENDENT_AMBULATORY_CARE_PROVIDER_SITE_OTHER): Payer: Medicare Other | Admitting: Internal Medicine

## 2012-10-17 ENCOUNTER — Encounter: Payer: Self-pay | Admitting: Internal Medicine

## 2012-10-17 VITALS — BP 118/70 | HR 75 | Temp 98.1°F | Resp 12 | Ht 66.0 in | Wt 167.0 lb

## 2012-10-17 DIAGNOSIS — E876 Hypokalemia: Secondary | ICD-10-CM | POA: Diagnosis not present

## 2012-10-17 DIAGNOSIS — R42 Dizziness and giddiness: Secondary | ICD-10-CM

## 2012-10-17 DIAGNOSIS — M810 Age-related osteoporosis without current pathological fracture: Secondary | ICD-10-CM

## 2012-10-17 DIAGNOSIS — I10 Essential (primary) hypertension: Secondary | ICD-10-CM

## 2012-10-17 DIAGNOSIS — E119 Type 2 diabetes mellitus without complications: Secondary | ICD-10-CM | POA: Diagnosis not present

## 2012-10-17 DIAGNOSIS — Z0000 Encounter for general adult medical examination without abnormal findings: Secondary | ICD-10-CM | POA: Diagnosis not present

## 2012-10-17 LAB — LIPID PANEL
Cholesterol: 157 mg/dL (ref 0–200)
HDL: 72.9 mg/dL (ref >39.00)
LDL Cholesterol: 66 mg/dL (ref 0–99)
Triglycerides: 89 mg/dL (ref 0.0–149.0)
VLDL: 17.8 mg/dL (ref 0.0–40.0)

## 2012-10-17 MED ORDER — AMLODIPINE BESYLATE 5 MG PO TABS: 5.0000 mg | ORAL_TABLET | Freq: Every day | ORAL | Status: DC

## 2012-10-17 NOTE — Patient Instructions (Addendum)
Please review the medication list in the After Visit Summary provided.Please verify the medication name (this may be  brand or generic) & correct dosage. Write the name of the prescribing physician to the right of the medication and share this with all medical staff seen at each appointment. This will help provide continuity of care; help optimize therapeutic interventions;and help prevent drug:drug adverse reaction.  Repeat the isometric exercises discussed 4- 5 times prior to standing if you've been in bed or seated for a period of time.

## 2012-10-17 NOTE — Progress Notes (Signed)
Subjective:    Patient ID: Sheila Morgan, female    DOB: 1932/06/30, 79 y.o.   MRN: [id number]  HPI Medicare Wellness Visit:  Psychosocial & medical history were reviewed as required by Medicare (abuse,antisocial behavioral risks,firearm risk).  Social history: caffeine:none  , alcohol: no  ,  tobacco use:   no Exercise : no  Home & personal  safety / fall risk: See 09/27/12 fall related to verigo Limitations of activities of daily living: must care for ill husband Seatbelt  and smoke alarm use:yes Power of Attorney/Living Will status : needed Ophthalmology exam status :current Hearing evaluation status: not current Orientation :oriented X 3 Memory & recall :good Spelling or math testing:good Active depression / anxiety:stressed as caregiver Foreign travel history : never Immunization status for Shingles /Flu/ PNA/ tetanus : Shingles needed Transfusion history:  no Preventive health surveillance status of colonoscopy, BMD , mammograms,PAP as per protocol/ SOC: current Dental care:  every 3 months Chart reviewed &  Updated. Active issues reviewed & addressed.      Review of Systems  HYPERTENSION follow-up: Home blood pressure  not monitored Patient is compliant with medications No adverse effects noted from medication No specific dietary program but salt restricted  No chest pain, palpitations, dyspnea, claudication,edema or paroxysmal nocturnal dyspnea described Persistent lightheadedness. No headache, epistaxis, or syncope.  HYPERLIPIDEMIA: Disease Monitoring: See symptoms for Hypertension Medications: Compliance- yes Abd pain, bowel changes- no Muscle aches- no           Objective:   Physical Exam  Gen.:  well-nourished in appearance. Alert, appropriate and cooperative throughout exam.Appears younger than stated age  Head: Normocephalic without obvious abnormalities  Eyes: No corneal or conjunctival inflammation noted. Ptosis bilaterally. Extraocular motion  intact. No nystagmus.Vision grossly normal with lenses Ears: External  ear exam reveals no significant lesions or deformities. Canals clear .TMs normal. Hearing is grossly decreased bilaterally; R > L. Nose: External nasal exam reveals no deformity or inflammation. Nasal mucosa are pink and moist. No lesions or exudates noted.  Mouth: Oral mucosa and oropharynx reveal no lesions or exudates. Teeth in good repair; upper& lower partials. Neck: No deformities, masses, or tenderness noted. Range of motion decreased. Thyroid. Lungs: Normal respiratory effort; chest expands symmetrically. Lungs are clear to auscultation without rales, wheezes, or increased work of breathing. Heart: Normal rate and rhythm. Normal S1 and S2. No gallop, click, or rub. Grade 1/2-1 over 6 systolic murmur Abdomen: Bowel sounds normal; abdomen soft and nontender. No masses, organomegaly or hernias noted. Genitalia: As per Gyn                                  Musculoskeletal/extremities:Accentuated curvature of upper thoracic  Spine. No clubbing, cyanosis, or edema.Flexion  extremity  deformity noted L 5th digit. R hand & forearm in cast. Able to lie down & sit up with minimal help. Negative SLR bilaterally. Fusiform changes L knee with crepitus. Vascular: Carotid, radial artery, dorsalis pedis and  posterior tibial pulses are full and equal. No bruits present. Neurologic: Alert and oriented x3. Deep tendon reflexes not checked due to knee pain from fall.      Skin: Intact without suspicious lesions or rashes. Lymph: No cervical, axillary lymphadenopathy present. Psych: Mood and affect are normal. Normally interactive  Assessment & Plan:  #1 Medicare Wellness Exam; criteria met ; data entered #2 Problem List/Diagnoses reviewed #3 vertigo ; ? Postural hypotension induced Plan:  Assessments made/ Orders entered

## 2012-10-21 LAB — VITAMIN D 1,25 DIHYDROXY: Vitamin D2 1, 25 (OH)2: <8 pg/mL

## 2012-10-24 ENCOUNTER — Encounter: Payer: Self-pay | Admitting: *Deleted

## 2012-10-24 DIAGNOSIS — S52599A Other fractures of lower end of unspecified radius, initial encounter for closed fracture: Secondary | ICD-10-CM | POA: Diagnosis not present

## 2012-10-24 DIAGNOSIS — S62639A Displaced fracture of distal phalanx of unspecified finger, initial encounter for closed fracture: Secondary | ICD-10-CM | POA: Diagnosis not present

## 2012-10-27 ENCOUNTER — Other Ambulatory Visit: Payer: Self-pay | Admitting: *Deleted

## 2012-10-27 MED ORDER — SIMVASTATIN 40 MG PO TABS: 40.0000 mg | ORAL_TABLET | Freq: Every evening | ORAL | Status: DC

## 2012-11-14 DIAGNOSIS — S62639A Displaced fracture of distal phalanx of unspecified finger, initial encounter for closed fracture: Secondary | ICD-10-CM | POA: Diagnosis not present

## 2012-11-14 DIAGNOSIS — IMO0001 Reserved for inherently not codable concepts without codable children: Secondary | ICD-10-CM | POA: Diagnosis not present

## 2012-11-16 ENCOUNTER — Other Ambulatory Visit: Payer: Self-pay | Admitting: *Deleted

## 2012-11-16 DIAGNOSIS — I10 Essential (primary) hypertension: Secondary | ICD-10-CM

## 2012-11-16 MED ORDER — AMLODIPINE BESYLATE 5 MG PO TABS: 5.0000 mg | ORAL_TABLET | Freq: Every day | ORAL | Status: DC

## 2012-11-16 NOTE — Telephone Encounter (Signed)
Rx refilled 11/16/12.

## 2012-12-03 ENCOUNTER — Emergency Department (HOSPITAL_COMMUNITY)
Admission: EM | Admit: 2012-12-03 | Discharge: 2012-12-03 | Disposition: A | Discharge status: Home / Self Care | Payer: No Typology Code available for payment source | Attending: Emergency Medicine | Admitting: Emergency Medicine

## 2012-12-03 ENCOUNTER — Encounter (HOSPITAL_COMMUNITY): Payer: Self-pay | Admitting: Radiology

## 2012-12-03 ENCOUNTER — Emergency Department (HOSPITAL_COMMUNITY): Payer: No Typology Code available for payment source

## 2012-12-03 DIAGNOSIS — I2510 Atherosclerotic heart disease of native coronary artery without angina pectoris: Secondary | ICD-10-CM | POA: Insufficient documentation

## 2012-12-03 DIAGNOSIS — IMO0002 Reserved for concepts with insufficient information to code with codable children: Secondary | ICD-10-CM | POA: Insufficient documentation

## 2012-12-03 DIAGNOSIS — E785 Hyperlipidemia, unspecified: Secondary | ICD-10-CM | POA: Insufficient documentation

## 2012-12-03 DIAGNOSIS — Z79899 Other long term (current) drug therapy: Secondary | ICD-10-CM | POA: Diagnosis not present

## 2012-12-03 DIAGNOSIS — R110 Nausea: Secondary | ICD-10-CM | POA: Diagnosis not present

## 2012-12-03 DIAGNOSIS — R0789 Other chest pain: Secondary | ICD-10-CM | POA: Insufficient documentation

## 2012-12-03 DIAGNOSIS — R0602 Shortness of breath: Secondary | ICD-10-CM | POA: Insufficient documentation

## 2012-12-03 DIAGNOSIS — Z951 Presence of aortocoronary bypass graft: Secondary | ICD-10-CM | POA: Insufficient documentation

## 2012-12-03 DIAGNOSIS — Y9389 Activity, other specified: Secondary | ICD-10-CM | POA: Diagnosis not present

## 2012-12-03 DIAGNOSIS — Y92410 Unspecified street and highway as the place of occurrence of the external cause: Secondary | ICD-10-CM | POA: Diagnosis not present

## 2012-12-03 DIAGNOSIS — M109 Gout, unspecified: Secondary | ICD-10-CM | POA: Diagnosis not present

## 2012-12-03 DIAGNOSIS — R079 Chest pain, unspecified: Secondary | ICD-10-CM | POA: Diagnosis not present

## 2012-12-03 DIAGNOSIS — F411 Generalized anxiety disorder: Secondary | ICD-10-CM | POA: Diagnosis not present

## 2012-12-03 DIAGNOSIS — Z9861 Coronary angioplasty status: Secondary | ICD-10-CM | POA: Diagnosis not present

## 2012-12-03 DIAGNOSIS — Z7982 Long term (current) use of aspirin: Secondary | ICD-10-CM | POA: Diagnosis not present

## 2012-12-03 DIAGNOSIS — I10 Essential (primary) hypertension: Secondary | ICD-10-CM | POA: Diagnosis not present

## 2012-12-03 DIAGNOSIS — M810 Age-related osteoporosis without current pathological fracture: Secondary | ICD-10-CM | POA: Diagnosis not present

## 2012-12-03 DIAGNOSIS — E119 Type 2 diabetes mellitus without complications: Secondary | ICD-10-CM | POA: Insufficient documentation

## 2012-12-03 DIAGNOSIS — S298XXA Other specified injuries of thorax, initial encounter: Secondary | ICD-10-CM | POA: Diagnosis not present

## 2012-12-03 DIAGNOSIS — M129 Arthropathy, unspecified: Secondary | ICD-10-CM | POA: Diagnosis not present

## 2012-12-03 LAB — COMPREHENSIVE METABOLIC PANEL
Albumin: 3.7 g/dL (ref 3.5–5.2)
BUN: 25 mg/dL — ABNORMAL HIGH (ref 6–23)
Calcium: 10.3 mg/dL (ref 8.4–10.5)
Creatinine, Ser: 1.36 mg/dL — ABNORMAL HIGH (ref 0.50–1.10)
Potassium: 3.6 mEq/L (ref 3.5–5.1)
Total Protein: 6.8 g/dL (ref 6.0–8.3)

## 2012-12-03 LAB — POCT I-STAT TROPONIN I: Troponin i, poc: 0 ng/mL (ref 0.00–0.08)

## 2012-12-03 LAB — CBC
HCT: 38.5 % (ref 36.0–46.0)
Hemoglobin: 13.5 g/dL (ref 12.0–15.0)
MCV: 91.2 fL (ref 78.0–100.0)
RBC: 4.22 MIL/uL (ref 3.87–5.11)
WBC: 9.7 10*3/uL (ref 4.0–10.5)

## 2012-12-03 MED ORDER — ASPIRIN 81 MG PO CHEW: 324.0000 mg | CHEWABLE_TABLET | Freq: Once | ORAL | Status: DC

## 2012-12-03 MED ORDER — SODIUM CHLORIDE 0.9 % IV BOLUS (SEPSIS)
500.0000 mL | Freq: Once | INTRAVENOUS | Status: AC
  Administered 2012-12-03: 500 mL via INTRAVENOUS

## 2012-12-03 MED ORDER — DIAZEPAM 5 MG PO TABS: 5.0000 mg | ORAL_TABLET | Freq: Four times a day (QID) | ORAL | Status: DC | PRN
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
## 2012-12-03 NOTE — ED Notes (Signed)
PA at bedside.
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
## 2012-12-07 DIAGNOSIS — IMO0001 Reserved for inherently not codable concepts without codable children: Secondary | ICD-10-CM | POA: Diagnosis not present

## 2012-12-07 DIAGNOSIS — S52599A Other fractures of lower end of unspecified radius, initial encounter for closed fracture: Secondary | ICD-10-CM | POA: Diagnosis not present

## 2012-12-07 DIAGNOSIS — S62639A Displaced fracture of distal phalanx of unspecified finger, initial encounter for closed fracture: Secondary | ICD-10-CM | POA: Diagnosis not present

## 2012-12-08 NOTE — ED Provider Notes (Signed)
Medical screening examination/treatment/procedure(s) were performed by non-physician practitioner and as supervising physician I was immediately available for consultation/collaboration.    Jeily Guthridge L Devonte Migues, MD 12/08/12 0809 

## 2012-12-09 ENCOUNTER — Ambulatory Visit (INDEPENDENT_AMBULATORY_CARE_PROVIDER_SITE_OTHER): Payer: Medicare Other | Admitting: Internal Medicine

## 2012-12-09 ENCOUNTER — Encounter: Payer: Self-pay | Admitting: Internal Medicine

## 2012-12-09 VITALS — BP 128/84 | HR 85 | Temp 98.4°F | Wt 172.0 lb

## 2012-12-09 DIAGNOSIS — N289 Disorder of kidney and ureter, unspecified: Secondary | ICD-10-CM | POA: Diagnosis not present

## 2012-12-09 DIAGNOSIS — E119 Type 2 diabetes mellitus without complications: Secondary | ICD-10-CM

## 2012-12-09 DIAGNOSIS — M25519 Pain in unspecified shoulder: Secondary | ICD-10-CM

## 2012-12-09 DIAGNOSIS — M542 Cervicalgia: Secondary | ICD-10-CM | POA: Diagnosis not present

## 2012-12-09 DIAGNOSIS — I10 Essential (primary) hypertension: Secondary | ICD-10-CM

## 2012-12-09 DIAGNOSIS — M25512 Pain in left shoulder: Secondary | ICD-10-CM

## 2012-12-09 MED ORDER — GLUCOSE BLOOD VI STRP: ORAL_STRIP | Status: DC

## 2012-12-09 MED ORDER — TRAMADOL HCL 50 MG PO TABS: 50.0000 mg | ORAL_TABLET | Freq: Four times a day (QID) | ORAL | Status: DC | PRN

## 2012-12-09 MED ORDER — LOSARTAN POTASSIUM 100 MG PO TABS: 100.0000 mg | ORAL_TABLET | Freq: Every day | ORAL | Status: DC

## 2012-12-09 MED ORDER — ONETOUCH ULTRASOFT LANCETS MISC: Status: DC

## 2012-12-09 NOTE — Patient Instructions (Addendum)
Use an anti-inflammatory cream such as Aspercreme or Zostrix cream twice a day to the neck as needed. In lieu of this warm moist compresses or  hot water bottle can be used. Do not apply ice.  BUN, creatinine, and GFR  all assess kidney function. To protect the kidneys it  is important to control your blood pressure and sugar. You should also stay well hydrated. Drink to thirst, up to 32 ounces of fluids per day.
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
## 2012-12-14 ENCOUNTER — Encounter: Payer: Self-pay | Admitting: Internal Medicine

## 2012-12-14 ENCOUNTER — Ambulatory Visit (INDEPENDENT_AMBULATORY_CARE_PROVIDER_SITE_OTHER): Payer: Medicare Other | Admitting: Internal Medicine

## 2012-12-14 DIAGNOSIS — M7542 Impingement syndrome of left shoulder: Secondary | ICD-10-CM

## 2012-12-14 DIAGNOSIS — M898X1 Other specified disorders of bone, shoulder: Secondary | ICD-10-CM

## 2012-12-14 DIAGNOSIS — M949 Disorder of cartilage, unspecified: Secondary | ICD-10-CM

## 2012-12-14 DIAGNOSIS — M533 Sacrococcygeal disorders, not elsewhere classified: Secondary | ICD-10-CM | POA: Diagnosis not present

## 2012-12-14 DIAGNOSIS — M899 Disorder of bone, unspecified: Secondary | ICD-10-CM | POA: Diagnosis not present

## 2012-12-14 DIAGNOSIS — M25819 Other specified joint disorders, unspecified shoulder: Secondary | ICD-10-CM | POA: Diagnosis not present

## 2012-12-14 MED ORDER — FENTANYL 25 MCG/HR TD PT72: 1.0000 | MEDICATED_PATCH | TRANSDERMAL | Status: DC

## 2012-12-14 NOTE — Patient Instructions (Addendum)
Use a rubber doughnut when sitting for prolonged period time to prevent discomfort in the coccyx. Soaking  in hot tub would also provide relief. Use an anti-inflammatory cream such as Aspercreme or Zostrix cream twice a day to the left shoulder as needed. In lieu of this warm moist compresses or  hot water bottle can be used. Do not apply ice to theshoulder.
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
## 2012-12-19 ENCOUNTER — Ambulatory Visit (INDEPENDENT_AMBULATORY_CARE_PROVIDER_SITE_OTHER)
Admission: RE | Admit: 2012-12-19 | Discharge: 2012-12-19 | Disposition: A | Discharge status: Home / Self Care | Payer: Medicare Other | Source: Ambulatory Visit | Attending: Internal Medicine | Admitting: Internal Medicine

## 2012-12-19 DIAGNOSIS — S4980XA Other specified injuries of shoulder and upper arm, unspecified arm, initial encounter: Secondary | ICD-10-CM | POA: Diagnosis not present

## 2012-12-19 DIAGNOSIS — M899 Disorder of bone, unspecified: Secondary | ICD-10-CM

## 2012-12-19 DIAGNOSIS — S46909A Unspecified injury of unspecified muscle, fascia and tendon at shoulder and upper arm level, unspecified arm, initial encounter: Secondary | ICD-10-CM | POA: Diagnosis not present

## 2012-12-19 DIAGNOSIS — M533 Sacrococcygeal disorders, not elsewhere classified: Secondary | ICD-10-CM | POA: Diagnosis not present

## 2012-12-19 DIAGNOSIS — IMO0002 Reserved for concepts with insufficient information to code with codable children: Secondary | ICD-10-CM | POA: Diagnosis not present

## 2012-12-19 DIAGNOSIS — M25519 Pain in unspecified shoulder: Secondary | ICD-10-CM | POA: Diagnosis not present

## 2012-12-19 DIAGNOSIS — M898X1 Other specified disorders of bone, shoulder: Secondary | ICD-10-CM

## 2012-12-21 DIAGNOSIS — M25569 Pain in unspecified knee: Secondary | ICD-10-CM | POA: Diagnosis not present

## 2012-12-21 DIAGNOSIS — L738 Other specified follicular disorders: Secondary | ICD-10-CM | POA: Diagnosis not present

## 2012-12-21 DIAGNOSIS — M1990 Unspecified osteoarthritis, unspecified site: Secondary | ICD-10-CM | POA: Diagnosis not present

## 2012-12-21 DIAGNOSIS — M810 Age-related osteoporosis without current pathological fracture: Secondary | ICD-10-CM | POA: Diagnosis not present

## 2012-12-22 ENCOUNTER — Ambulatory Visit: Payer: Medicare Other | Attending: Internal Medicine | Admitting: Rehabilitation

## 2012-12-22 DIAGNOSIS — M25619 Stiffness of unspecified shoulder, not elsewhere classified: Secondary | ICD-10-CM | POA: Insufficient documentation

## 2012-12-22 DIAGNOSIS — IMO0001 Reserved for inherently not codable concepts without codable children: Secondary | ICD-10-CM | POA: Insufficient documentation

## 2012-12-22 DIAGNOSIS — M25519 Pain in unspecified shoulder: Secondary | ICD-10-CM | POA: Insufficient documentation

## 2012-12-22 DIAGNOSIS — R293 Abnormal posture: Secondary | ICD-10-CM | POA: Diagnosis not present

## 2012-12-28 ENCOUNTER — Ambulatory Visit: Payer: Medicare Other | Admitting: Rehabilitation

## 2012-12-29 ENCOUNTER — Ambulatory Visit: Payer: Medicare Other | Admitting: Physical Therapy

## 2013-01-03 ENCOUNTER — Ambulatory Visit: Payer: Medicare Other | Attending: Internal Medicine | Admitting: Rehabilitation

## 2013-01-03 DIAGNOSIS — R293 Abnormal posture: Secondary | ICD-10-CM | POA: Diagnosis not present

## 2013-01-03 DIAGNOSIS — M25619 Stiffness of unspecified shoulder, not elsewhere classified: Secondary | ICD-10-CM | POA: Insufficient documentation

## 2013-01-03 DIAGNOSIS — IMO0001 Reserved for inherently not codable concepts without codable children: Secondary | ICD-10-CM | POA: Diagnosis not present

## 2013-01-03 DIAGNOSIS — M25519 Pain in unspecified shoulder: Secondary | ICD-10-CM | POA: Insufficient documentation

## 2013-01-05 ENCOUNTER — Ambulatory Visit: Payer: Medicare Other | Admitting: Physical Therapy

## 2013-01-10 ENCOUNTER — Ambulatory Visit: Payer: Medicare Other | Admitting: Rehabilitation

## 2013-01-12 ENCOUNTER — Ambulatory Visit: Payer: Medicare Other | Admitting: Physical Therapy

## 2013-01-16 ENCOUNTER — Other Ambulatory Visit: Payer: Self-pay | Admitting: *Deleted

## 2013-01-16 MED ORDER — CARVEDILOL 12.5 MG PO TABS: 12.5000 mg | ORAL_TABLET | Freq: Two times a day (BID) | ORAL | Status: DC

## 2013-01-16 MED ORDER — AMLODIPINE BESYLATE 5 MG PO TABS: 5.0000 mg | ORAL_TABLET | Freq: Every day | ORAL | Status: DC

## 2013-01-16 NOTE — Telephone Encounter (Signed)
Rx was refilled for amlodipine 5 mg.  Ag cma 

## 2013-01-16 NOTE — Telephone Encounter (Signed)
Rx was refilled for carvedilol 12.5 mg.  ag cma

## 2013-01-17 ENCOUNTER — Ambulatory Visit: Payer: Medicare Other | Admitting: Rehabilitation

## 2013-01-19 ENCOUNTER — Ambulatory Visit: Payer: Medicare Other | Admitting: Physical Therapy

## 2013-01-24 ENCOUNTER — Ambulatory Visit: Payer: Medicare Other | Admitting: Rehabilitation

## 2013-01-24 ENCOUNTER — Encounter: Payer: Self-pay | Admitting: Lab

## 2013-01-25 ENCOUNTER — Ambulatory Visit (INDEPENDENT_AMBULATORY_CARE_PROVIDER_SITE_OTHER): Payer: Medicare Other | Admitting: Internal Medicine

## 2013-01-25 ENCOUNTER — Encounter: Payer: Self-pay | Admitting: Internal Medicine

## 2013-01-25 VITALS — BP 158/69 | HR 75 | Temp 97.9°F | Wt 170.6 lb

## 2013-01-25 DIAGNOSIS — M810 Age-related osteoporosis without current pathological fracture: Secondary | ICD-10-CM

## 2013-01-25 DIAGNOSIS — M25819 Other specified joint disorders, unspecified shoulder: Secondary | ICD-10-CM | POA: Diagnosis not present

## 2013-01-25 DIAGNOSIS — M7540 Impingement syndrome of unspecified shoulder: Secondary | ICD-10-CM | POA: Insufficient documentation

## 2013-01-25 DIAGNOSIS — M7542 Impingement syndrome of left shoulder: Secondary | ICD-10-CM

## 2013-01-25 NOTE — Patient Instructions (Addendum)
Use an anti-inflammatory cream such as Aspercreme or Zostrix cream twice a day to the affected area as needed. In lieu of this warm moist compresses or  hot water bottle can be used. Do not apply ice . 
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
## 2013-01-26 ENCOUNTER — Ambulatory Visit: Payer: Medicare Other | Admitting: Rehabilitation

## 2013-01-26 ENCOUNTER — Ambulatory Visit: Payer: Medicare Other | Admitting: Physical Therapy

## 2013-01-31 ENCOUNTER — Ambulatory Visit: Payer: Medicare Other | Admitting: Rehabilitation

## 2013-02-02 ENCOUNTER — Ambulatory Visit: Payer: Medicare Other | Attending: Internal Medicine | Admitting: Rehabilitation

## 2013-02-02 DIAGNOSIS — M25519 Pain in unspecified shoulder: Secondary | ICD-10-CM | POA: Diagnosis not present

## 2013-02-02 DIAGNOSIS — R293 Abnormal posture: Secondary | ICD-10-CM | POA: Insufficient documentation

## 2013-02-02 DIAGNOSIS — M25619 Stiffness of unspecified shoulder, not elsewhere classified: Secondary | ICD-10-CM | POA: Insufficient documentation

## 2013-02-02 DIAGNOSIS — IMO0001 Reserved for inherently not codable concepts without codable children: Secondary | ICD-10-CM | POA: Diagnosis not present

## 2013-02-04 DIAGNOSIS — Z23 Encounter for immunization: Secondary | ICD-10-CM | POA: Diagnosis not present

## 2013-02-07 ENCOUNTER — Ambulatory Visit: Payer: Medicare Other | Admitting: Rehabilitation

## 2013-02-09 ENCOUNTER — Encounter: Payer: Medicare Other | Admitting: Rehabilitation

## 2013-03-11 ENCOUNTER — Other Ambulatory Visit: Payer: Self-pay | Admitting: Cardiovascular Disease

## 2013-03-13 ENCOUNTER — Encounter: Payer: Self-pay | Admitting: Cardiovascular Disease

## 2013-03-13 ENCOUNTER — Ambulatory Visit (INDEPENDENT_AMBULATORY_CARE_PROVIDER_SITE_OTHER): Payer: Medicare Other | Admitting: Cardiovascular Disease

## 2013-03-13 VITALS — BP 172/92 | HR 78 | Ht 67.0 in | Wt 173.2 lb

## 2013-03-13 DIAGNOSIS — Z951 Presence of aortocoronary bypass graft: Secondary | ICD-10-CM | POA: Diagnosis not present

## 2013-03-13 DIAGNOSIS — M5137 Other intervertebral disc degeneration, lumbosacral region: Secondary | ICD-10-CM | POA: Diagnosis not present

## 2013-03-13 DIAGNOSIS — I10 Essential (primary) hypertension: Secondary | ICD-10-CM | POA: Diagnosis not present

## 2013-03-13 DIAGNOSIS — E785 Hyperlipidemia, unspecified: Secondary | ICD-10-CM | POA: Diagnosis not present

## 2013-03-13 DIAGNOSIS — M25559 Pain in unspecified hip: Secondary | ICD-10-CM | POA: Diagnosis not present

## 2013-03-13 NOTE — Assessment & Plan Note (Signed)
On statin therapy with recent lipid profile performed 10/17/12 revealing a total cholesterol of 57, LDL of 66 and HDL of 72.

## 2013-03-13 NOTE — Assessment & Plan Note (Signed)
Her blood pressure today is elevated. She is on antihypertensive medications. She is in chronic pain because of back and knee pain.

## 2013-03-13 NOTE — Progress Notes (Signed)
03/13/2013 Sheila Morgan   02/01/1933  [id number]  Primary Physician Sheila Melnick, MD Primary Cardiologist: Sheila Gess MD [name]   HPI:  The patient is a 79 year old mildly overweight married [place] American female mother of 2, grandmother to 4 grandchildren who I last saw 6 months ago. I take care of her husband as well, who is now chronically ill. She has a history of CAD status post remote 1-vessel coronary artery bypass grafting with a LIMA to her LAD in 1994. I re-catheterized her in November of 2002 revealing a patent LIMA to the LAD with high-grade distal RCA disease which I stented using a 2.75 x 8 mm long BX [employer] bare metal stent. Her other problems include hypertension, hyperlipidemia, non-insulin-requiring diabetes. She denies chest pain or shortness of breath. She had a Myoview stress test performed March 14, 2012 which was entirely normal. Her last lipid profile was performed 6 months ago that was acceptable for secondary prevention.since she was seen here 6 months ago she's been asymptomatic with regards to her heart. She does complain of back and knee pain which I suspect is arthritic versus neurologic.     Current Outpatient Prescriptions  Medication Sig Dispense Refill  . alendronate (FOSAMAX) 70 MG tablet Take 70 mg by mouth every 7 (seven) days. Take with a full glass of water on an empty stomach. saturday      . amLODipine (NORVASC) 5 MG tablet Take 1 tablet (5 mg total) by mouth daily.  90 tablet  1  . aspirin 81 MG tablet Take 81 mg by mouth daily.        . Calcium Carbonate-Vitamin D (CALCIUM 600-D PO) Take 1 tablet by mouth 2 (two) times daily.       . carvedilol (COREG) 12.5 MG tablet Take 1 tablet (12.5 mg total) by mouth 2 (two) times daily with a meal.  180 tablet  0  . colchicine (COLCRYS) 0.6 MG tablet Take 0.6 mg by mouth daily.      Marland Kitchen glucose blood (ONE TOUCH ULTRA TEST) test strip Use as instructed  100 each  4  .  Lancets (ONETOUCH ULTRASOFT) lancets Use as instructed  100 each  4  . losartan (COZAAR) 100 MG tablet Take 1 tablet (100 mg total) by mouth daily.  90 tablet  3  . Meclizine HCl 25 MG CHEW Chew 25 mg by mouth every 8 (eight) hours as needed (dizziness).      . Multiple Vitamin (MULITIVITAMIN WITH MINERALS) TABS Take 1 tablet by mouth daily.      . predniSONE (DELTASONE) 5 MG tablet Take 5 mg by mouth daily.      . simvastatin (ZOCOR) 40 MG tablet Take 40 mg by mouth every evening.      . vitamin B-12 (CYANOCOBALAMIN) 1000 MCG tablet Take 1,000 mcg by mouth daily.        . isosorbide mononitrate (IMDUR) 30 MG 24 hr tablet TAKE 1/2 TABLET BY MOUTH EVERY DAY  15 tablet  6   No current facility-administered medications for this visit.    No Known Allergies  History   Social History  . Marital Status: Married    Spouse Name: N/A    Number of Children: N/A  . Years of Education: N/A   Occupational History  . Not on file.   Social History Main Topics  . Smoking status: Never Smoker   . Smokeless tobacco: Never Used  . Alcohol Use: No  .  Drug Use: No  . Sexual Activity: Not Currently   Other Topics Concern  . Not on file   Social History Narrative  . No narrative on file     Review of Systems: General: negative for chills, fever, night sweats or weight changes.  Cardiovascular: negative for chest pain, dyspnea on exertion, edema, orthopnea, palpitations, paroxysmal nocturnal dyspnea or shortness of breath Dermatological: negative for rash Respiratory: negative for cough or wheezing Urologic: negative for hematuria Abdominal: negative for nausea, vomiting, diarrhea, bright red blood per rectum, melena, or hematemesis Neurologic: negative for visual changes, syncope, or dizziness All other systems reviewed and are otherwise negative except as noted above.    Blood pressure 172/92, pulse 78, height 5\' 7"  (1.702 m), weight 173 lb 3.2 oz (78.563 kg).  General appearance: alert  and no distress Neck: no adenopathy, no carotid bruit, no JVD, supple, symmetrical, trachea midline and thyroid not enlarged, symmetric, no tenderness/mass/nodules Lungs: clear to auscultation bilaterally Heart: regular rate and rhythm, S1, S2 normal, no murmur, click, rub or gallop Extremities: extremities normal, atraumatic, no cyanosis or edema  EKG normal sinus rhythm at 78 without ST or T wave changes  ASSESSMENT AND PLAN:   CORONARY ARTERY BYPASS GRAFT, HX OF status post LAD angioplasty in 1993, coronary artery bypass grafting x1 with a LIMA to the LAD July of 1994 and stenting of her RCA with a bare-metal stent in 2002. Her last Myoview performed in November 2013 was normal. She denies chest pain or shortness of breath  HYPERTENSION Her blood pressure today is elevated. She is on antihypertensive medications. She is in chronic pain because of back and knee pain.  HYPERLIPIDEMIA On statin therapy with recent lipid profile performed 10/17/12 revealing a total cholesterol of 57, LDL of 66 and HDL of 72.      Sheila Gess MD FACP,FACC,FAHA, [facility] 03/13/2013 11:20 AM

## 2013-03-13 NOTE — Patient Instructions (Signed)
Your physician wants you to follow-up in: 6 months with an extender and 1 year with Dr Berry. You will receive a reminder letter in the mail two months in advance. If you don't receive a letter, please call our office to schedule the follow-up appointment.  

## 2013-03-13 NOTE — Assessment & Plan Note (Signed)
status post LAD angioplasty in 1993, coronary artery bypass grafting x1 with a LIMA to the LAD July of 1994 and stenting of her RCA with a bare-metal stent in 2002. Her last Myoview performed in November 2013 was normal. She denies chest pain or shortness of breath

## 2013-03-13 NOTE — Telephone Encounter (Signed)
Rx was sent to pharmacy electronically. 

## 2013-03-18 ENCOUNTER — Encounter: Payer: Self-pay | Admitting: Internal Medicine

## 2013-03-18 ENCOUNTER — Other Ambulatory Visit: Payer: Self-pay | Admitting: Internal Medicine

## 2013-03-18 DIAGNOSIS — I10 Essential (primary) hypertension: Secondary | ICD-10-CM

## 2013-03-18 DIAGNOSIS — E1129 Type 2 diabetes mellitus with other diabetic kidney complication: Secondary | ICD-10-CM | POA: Insufficient documentation

## 2013-03-18 DIAGNOSIS — M5416 Radiculopathy, lumbar region: Secondary | ICD-10-CM | POA: Insufficient documentation

## 2013-03-18 DIAGNOSIS — E1329 Other specified diabetes mellitus with other diabetic kidney complication: Secondary | ICD-10-CM

## 2013-03-21 ENCOUNTER — Other Ambulatory Visit: Payer: Medicare Other

## 2013-03-21 NOTE — Progress Notes (Signed)
Called and spoke with patient. Lab appt scheduled for 03/24/2013, OV scheduled for 03/29/13

## 2013-03-24 ENCOUNTER — Other Ambulatory Visit (INDEPENDENT_AMBULATORY_CARE_PROVIDER_SITE_OTHER): Payer: Medicare Other

## 2013-03-24 DIAGNOSIS — N058 Unspecified nephritic syndrome with other morphologic changes: Secondary | ICD-10-CM | POA: Diagnosis not present

## 2013-03-24 DIAGNOSIS — E1329 Other specified diabetes mellitus with other diabetic kidney complication: Secondary | ICD-10-CM

## 2013-03-24 DIAGNOSIS — I10 Essential (primary) hypertension: Secondary | ICD-10-CM | POA: Diagnosis not present

## 2013-03-24 LAB — BASIC METABOLIC PANEL
BUN: 24 mg/dL — ABNORMAL HIGH (ref 6–23)
Creatinine, Ser: 1.2 mg/dL (ref 0.4–1.2)
GFR: 56.9 mL/min — ABNORMAL LOW (ref >60.00)

## 2013-03-24 LAB — HEMOGLOBIN A1C: Hgb A1c MFr Bld: 7.3 % — ABNORMAL HIGH (ref 4.6–6.5)

## 2013-03-29 ENCOUNTER — Encounter: Payer: Self-pay | Admitting: Internal Medicine

## 2013-03-29 ENCOUNTER — Ambulatory Visit (INDEPENDENT_AMBULATORY_CARE_PROVIDER_SITE_OTHER): Payer: Medicare Other | Admitting: Internal Medicine

## 2013-03-29 VITALS — BP 158/73 | HR 84 | Temp 98.4°F | Resp 14 | Ht 66.0 in | Wt 171.8 lb

## 2013-03-29 DIAGNOSIS — Z23 Encounter for immunization: Secondary | ICD-10-CM

## 2013-03-29 DIAGNOSIS — E876 Hypokalemia: Secondary | ICD-10-CM

## 2013-03-29 DIAGNOSIS — M5416 Radiculopathy, lumbar region: Secondary | ICD-10-CM

## 2013-03-29 DIAGNOSIS — IMO0002 Reserved for concepts with insufficient information to code with codable children: Secondary | ICD-10-CM | POA: Diagnosis not present

## 2013-03-29 DIAGNOSIS — N058 Unspecified nephritic syndrome with other morphologic changes: Secondary | ICD-10-CM

## 2013-03-29 DIAGNOSIS — M11262 Other chondrocalcinosis, left knee: Secondary | ICD-10-CM | POA: Insufficient documentation

## 2013-03-29 DIAGNOSIS — E1329 Other specified diabetes mellitus with other diabetic kidney complication: Secondary | ICD-10-CM | POA: Diagnosis not present

## 2013-03-29 DIAGNOSIS — M11869 Other specified crystal arthropathies, unspecified knee: Secondary | ICD-10-CM

## 2013-03-29 DIAGNOSIS — I10 Essential (primary) hypertension: Secondary | ICD-10-CM

## 2013-03-29 MED ORDER — PNEUMOCOCCAL VAC POLYVALENT 25 MCG/0.5ML IJ INJ: 0.5000 mL | INJECTION | Freq: Once | INTRAMUSCULAR | Status: DC

## 2013-03-29 NOTE — Patient Instructions (Addendum)
Eat a low-fat diet with lots of fruits and vegetables, up to 7-9 servings per day.Consume less than 40 grams of sugar per day from foods & drinks with High Fructose Corn Sugar as #1,2,3 or # 4 on label. Follow the low carb nutrition program in [employer] as closely as possible to prevent Diabetes progression & complications. White carbohydrates (potatoes, rice, bread, and pasta) have a high spike of sugar and a high load of sugar. For example a  baked potato has a cup of sugar and a  french fry  2 teaspoons of sugar. Yams, wild  rice, whole grained bread &  wheat pasta have been much lower spike and load of  sugar. Portions should be the size of a deck of cards or your palm. Minimal Blood Pressure Goal= AVERAGE < 140/90;  Ideal is an AVERAGE < 135/85. This AVERAGE should be calculated from @ least 5-7 BP readings taken @ different times of day on different days of week. You should not respond to isolated BP readings , but rather the AVERAGE for that week .Please bring your  blood pressure cuff to office visits to verify that it is reliable.It  can also be checked against the blood pressure device at the pharmacy. Finger or wrist cuffs are not dependable; an arm cuff is. Check glucose once daily if possible Fasting or morning glucose recommended M, W, F, & Sun if possible. Goal= 100-150 Glucose 2 hours after breakfast Tues, after lunch Thurs & 2 hrs after eve meal Sat if possible. Goal = < 180. Annual podiatry and retinal assessments are indicated because of  the diabetes.

## 2013-03-29 NOTE — Progress Notes (Signed)
Subjective:    Patient ID: Sheila Morgan, female    DOB: 01-26-1933, 79 y.o.   MRN: [id number]  HPI   Her orthopedist placed her on prednisone starting at a dose of 40 mg with subsequent wean for right lumbar radiculopathy. She sustained this after lifting her husband. With the high-dose prednisone she describes profound imbalance.  She has been on chronic low-dose prednisone, 5 mg, for pseudogout of the left knee from [facility]. This was discontinued with initiation of high-dose steroids. She had resumed the 5 mg daily dose as of today.  Labs were done to evaluate her electrolytes and diabetic status. Chemistries revealed a mildly elevated BUN of 24 and GFR 56.90. Her A1c had risen from 6.6% to a value of 7.3%. She also exhibited some microalbuminuria with a value of 2.8. The prior value had been 1.8.  Her past history includes diabetes with renal manifestations and osteoporosis. She also has history of hypokalemia in the past.All of these co-morbidites could be exacerbated by high dose or maintenance steroids. Fasting or morning glucose range 87-135. Highest glucose 2 hours after any meal is 245, last week on high dose steroids.. No hypoglycemia reported .                                                                                                           No regular exercise due to the radiculopathy which has improved. No specific nutrition/diet followed Medication compliance is good. No  DM medication at present. Eye exam not current.Foot care not current.Follow up discussed.    Review of Systems  No excess thirst ;  excess hunger ; or excess urination reported                              Some lightheadedness with standing reported No chest pain ; palpitations ; claudication described .                                                                                                                             No non healing skin  ulcers or sores of extremities noted. No  numbness or tingling or burning in feet described  No significant change in weight . No blurred,double, or loss of vision reported  .         Objective:   Physical Exam Gen.:  well-nourished in appearance. Alert, appropriate and cooperative throughout exam.Appears younger than stated age  Head: Normocephalic without obvious abnormalities  Eyes: No corneal or conjunctival inflammation noted. Pupils equal round reactive to light and accommodation.   Nose: External nasal exam reveals no deformity or inflammation. Nasal mucosa are pink and moist. No lesions or exudates noted.   Mouth: Oral mucosa and oropharynx reveal no lesions or exudates. Teeth in good repair. Neck: No deformities, masses, or tenderness noted.  Thyroid normal. Lungs: Normal respiratory effort; chest expands symmetrically. Lungs are clear to auscultation without rales, wheezes, or increased work of breathing. Heart: Normal rate and rhythm. Normal S1 and S2. No gallop, click, or rub. S4 w/o murmur. Abdomen: Bowel sounds normal; abdomen soft and nontender. No masses, organomegaly or hernias noted.                                  Musculoskeletal/extremities: crepitus of knees; ? Slight effusion R knee. Using cane No clubbing, cyanosis or edema.Tone & strength normal. Hand joints  reveal mild  PIP changes. Fingernail l health good. Toenails thickened, slightly discolored, and slightly deformed. Able to lie down & sit up w/o help. Negative SLR bilaterally Vascular: Carotid, radial artery, dorsalis pedis and  posterior tibial pulses are full and equal. No bruits present. Neurologic: Alert and oriented x3. Light touch normal over feet      Skin: Intact without suspicious lesions or rashes. Lymph: No cervical, axillary lymphadenopathy present. Psych: Mood and affect are normal. Normally interactive                                                                                         Assessment & Plan:  See Current Assessment & Plan in Problem List under specific Diagnosis

## 2013-03-29 NOTE — Assessment & Plan Note (Signed)
BP goals discussed 

## 2013-03-29 NOTE — Assessment & Plan Note (Signed)
Eat a low-fat diet with lots of fruits and vegetables, up to 7-9 servings per day. Avoid obesity; your goal is waist measurement < 35 inches.Consume less than 30 grams of sugar per day from foods & drinks with High Fructose Corn Sugar as #1,2,3 or # 4 on label. Follow the low carb nutrition program in [employer] as closely as possible to prevent Diabetes progression & complications. White carbohydrates (potatoes, rice, bread, and pasta) have a high spike of sugar and a high load of sugar. For example a  baked potato has a cup of sugar and a  french fry  2 teaspoons of sugar. Yams, wild  rice, whole grained bread &  wheat pasta have been much lower spike and load of  sugar. Portions should be the size of a deck of cards or your palm. A1c & urine microalb in 3 mos

## 2013-03-29 NOTE — Assessment & Plan Note (Signed)
As per Dr Thurston Hole

## 2013-03-29 NOTE — Progress Notes (Signed)
Pre visit review using our clinic review tool, if applicable. No additional management support is needed unless otherwise documented below in the visit note. 

## 2013-03-30 NOTE — Assessment & Plan Note (Signed)
Current K+ 3.5 despite high dose steroids

## 2013-04-11 DIAGNOSIS — M25569 Pain in unspecified knee: Secondary | ICD-10-CM | POA: Diagnosis not present

## 2013-04-12 ENCOUNTER — Telehealth: Payer: Self-pay | Admitting: Internal Medicine

## 2013-04-12 ENCOUNTER — Other Ambulatory Visit: Payer: Self-pay | Admitting: *Deleted

## 2013-04-12 DIAGNOSIS — R928 Other abnormal and inconclusive findings on diagnostic imaging of breast: Secondary | ICD-10-CM | POA: Diagnosis not present

## 2013-04-12 MED ORDER — METFORMIN HCL 500 MG PO TABS: ORAL_TABLET | ORAL | Status: DC

## 2013-04-12 NOTE — Telephone Encounter (Signed)
Patient states that she was given an rx for Prednisone by her orthopedic doctor and had two steroid injections in her knees yesterday which she feels is the cause of her blood sugar spiking up last night. She checked her blood sugar this morning and it was at 173. Patient wants to know whether Dr. [name] will prescribe her something to keep it down. She was told by her Ortho doctor to call our office. Please advise.

## 2013-04-12 NOTE — Telephone Encounter (Signed)
Metformin called into pharmacy. Called and made patient aware. She verbalized understanding.

## 2013-04-12 NOTE — Telephone Encounter (Signed)
Metformin 500 mg #30 ; 1 qd with largest meal. Follow the low carb nutrition program in [employer] as closely as possible to prevent Diabetes progression & complications. White carbohydrates (potatoes, rice, bread, and pasta) have a high spike of sugar and a high load of sugar. For example a  baked potato has a cup of sugar and a  french fry  2 teaspoons of sugar. Yams, wild  rice, whole grained bread &  wheat pasta have been much lower spike and load of  sugar.

## 2013-04-12 NOTE — Telephone Encounter (Signed)
Please advise 

## 2013-04-19 ENCOUNTER — Other Ambulatory Visit: Payer: Self-pay | Admitting: Internal Medicine

## 2013-04-19 NOTE — Telephone Encounter (Signed)
Carvedilol refilled per protocol. JG//CMA 

## 2013-05-09 ENCOUNTER — Observation Stay (HOSPITAL_COMMUNITY)
Admission: EM | Admit: 2013-05-09 | Discharge: 2013-05-11 | Disposition: A | Discharge status: Home / Self Care | Payer: Medicare Other | Attending: Internal Medicine | Admitting: Internal Medicine

## 2013-05-09 ENCOUNTER — Emergency Department (HOSPITAL_COMMUNITY): Payer: Medicare Other

## 2013-05-09 ENCOUNTER — Encounter: Payer: Self-pay | Admitting: Nurse Practitioner

## 2013-05-09 ENCOUNTER — Ambulatory Visit: Payer: Medicare Other | Admitting: Nurse Practitioner

## 2013-05-09 ENCOUNTER — Ambulatory Visit (INDEPENDENT_AMBULATORY_CARE_PROVIDER_SITE_OTHER): Payer: Medicare Other | Admitting: Nurse Practitioner

## 2013-05-09 ENCOUNTER — Encounter (HOSPITAL_COMMUNITY): Payer: Self-pay | Admitting: Emergency Medicine

## 2013-05-09 VITALS — BP 167/85 | HR 102 | Temp 99.5°F | Ht 66.0 in

## 2013-05-09 DIAGNOSIS — I10 Essential (primary) hypertension: Secondary | ICD-10-CM | POA: Diagnosis not present

## 2013-05-09 DIAGNOSIS — Z951 Presence of aortocoronary bypass graft: Secondary | ICD-10-CM | POA: Diagnosis not present

## 2013-05-09 DIAGNOSIS — E1129 Type 2 diabetes mellitus with other diabetic kidney complication: Secondary | ICD-10-CM | POA: Diagnosis not present

## 2013-05-09 DIAGNOSIS — E1329 Other specified diabetes mellitus with other diabetic kidney complication: Secondary | ICD-10-CM | POA: Diagnosis not present

## 2013-05-09 DIAGNOSIS — E119 Type 2 diabetes mellitus without complications: Secondary | ICD-10-CM | POA: Diagnosis not present

## 2013-05-09 DIAGNOSIS — M353 Polymyalgia rheumatica: Secondary | ICD-10-CM | POA: Insufficient documentation

## 2013-05-09 DIAGNOSIS — R5381 Other malaise: Secondary | ICD-10-CM | POA: Insufficient documentation

## 2013-05-09 DIAGNOSIS — R509 Fever, unspecified: Secondary | ICD-10-CM | POA: Diagnosis not present

## 2013-05-09 DIAGNOSIS — R059 Cough, unspecified: Secondary | ICD-10-CM | POA: Diagnosis not present

## 2013-05-09 DIAGNOSIS — I2510 Atherosclerotic heart disease of native coronary artery without angina pectoris: Secondary | ICD-10-CM | POA: Insufficient documentation

## 2013-05-09 DIAGNOSIS — Z79899 Other long term (current) drug therapy: Secondary | ICD-10-CM | POA: Diagnosis not present

## 2013-05-09 DIAGNOSIS — R531 Weakness: Secondary | ICD-10-CM | POA: Diagnosis present

## 2013-05-09 DIAGNOSIS — J111 Influenza due to unidentified influenza virus with other respiratory manifestations: Principal | ICD-10-CM | POA: Insufficient documentation

## 2013-05-09 DIAGNOSIS — Z7982 Long term (current) use of aspirin: Secondary | ICD-10-CM | POA: Insufficient documentation

## 2013-05-09 DIAGNOSIS — R0602 Shortness of breath: Secondary | ICD-10-CM | POA: Diagnosis not present

## 2013-05-09 DIAGNOSIS — N289 Disorder of kidney and ureter, unspecified: Secondary | ICD-10-CM | POA: Insufficient documentation

## 2013-05-09 DIAGNOSIS — R05 Cough: Secondary | ICD-10-CM | POA: Insufficient documentation

## 2013-05-09 DIAGNOSIS — N058 Unspecified nephritic syndrome with other morphologic changes: Secondary | ICD-10-CM

## 2013-05-09 DIAGNOSIS — R52 Pain, unspecified: Secondary | ICD-10-CM | POA: Insufficient documentation

## 2013-05-09 DIAGNOSIS — R5383 Other fatigue: Secondary | ICD-10-CM | POA: Diagnosis not present

## 2013-05-09 LAB — URINALYSIS, ROUTINE W REFLEX MICROSCOPIC
Bilirubin Urine: NEGATIVE
Glucose, UA: NEGATIVE mg/dL
Hgb urine dipstick: NEGATIVE
KETONES UR: 15 mg/dL — AB
LEUKOCYTES UA: NEGATIVE
Nitrite: NEGATIVE
PROTEIN: 30 mg/dL — AB
Specific Gravity, Urine: 1.017 (ref 1.005–1.030)
Urobilinogen, UA: 0.2 mg/dL (ref 0.0–1.0)
pH: 7.5 (ref 5.0–8.0)

## 2013-05-09 LAB — CBC WITH DIFFERENTIAL/PLATELET
BASOS PCT: 1 % (ref 0–1)
Basophils Absolute: 0 10*3/uL (ref 0.0–0.1)
EOS PCT: 1 % (ref 0–5)
Eosinophils Absolute: 0.1 10*3/uL (ref 0.0–0.7)
HEMATOCRIT: 43.8 % (ref 36.0–46.0)
Hemoglobin: 14.4 g/dL (ref 12.0–15.0)
Lymphocytes Relative: 12 % (ref 12–46)
Lymphs Abs: 1.1 10*3/uL (ref 0.7–4.0)
MCH: 30.8 pg (ref 26.0–34.0)
MCHC: 32.9 g/dL (ref 30.0–36.0)
MCV: 93.6 fL (ref 78.0–100.0)
MONO ABS: 0.5 10*3/uL (ref 0.1–1.0)
Monocytes Relative: 6 % (ref 3–12)
Neutro Abs: 6.9 10*3/uL (ref 1.7–7.7)
Neutrophils Relative %: 80 % — ABNORMAL HIGH (ref 43–77)
Platelets: 119 10*3/uL — ABNORMAL LOW (ref 150–400)
RBC: 4.68 MIL/uL (ref 3.87–5.11)
RDW: 14.8 % (ref 11.5–15.5)
WBC: 8.6 10*3/uL (ref 4.0–10.5)

## 2013-05-09 LAB — CG4 I-STAT (LACTIC ACID): Lactic Acid, Venous: 1.24 mmol/L (ref 0.5–2.2)

## 2013-05-09 LAB — POCT I-STAT, CHEM 8
BUN: 16 mg/dL (ref 6–23)
CHLORIDE: 98 meq/L (ref 96–112)
CREATININE: 1 mg/dL (ref 0.50–1.10)
Calcium, Ion: 1.21 mmol/L (ref 1.13–1.30)
Glucose, Bld: 110 mg/dL — ABNORMAL HIGH (ref 70–99)
HCT: 46 % (ref 36.0–46.0)
Hemoglobin: 15.6 g/dL — ABNORMAL HIGH (ref 12.0–15.0)
Potassium: 3.5 mEq/L — ABNORMAL LOW (ref 3.7–5.3)
SODIUM: 140 meq/L (ref 137–147)
TCO2: 31 mmol/L (ref 0–100)

## 2013-05-09 LAB — INFLUENZA PANEL BY PCR (TYPE A & B)
H1N1 flu by pcr: NOT DETECTED
Influenza A By PCR: POSITIVE — AB
Influenza B By PCR: NEGATIVE

## 2013-05-09 LAB — URINE MICROSCOPIC-ADD ON

## 2013-05-09 LAB — GLUCOSE, CAPILLARY: Glucose-Capillary: 75 mg/dL (ref 70–99)

## 2013-05-09 MED ORDER — IBUPROFEN 800 MG PO TABS
800.0000 mg | ORAL_TABLET | Freq: Once | ORAL | Status: AC
  Administered 2013-05-09: 800 mg via ORAL
  Filled 2013-05-09: qty 1

## 2013-05-09 MED ORDER — OSELTAMIVIR PHOSPHATE 75 MG PO CAPS
75.0000 mg | ORAL_CAPSULE | Freq: Once | ORAL | Status: AC
  Administered 2013-05-09: 75 mg via ORAL
  Filled 2013-05-09: qty 1

## 2013-05-09 MED ORDER — SIMVASTATIN 40 MG PO TABS: 40.0000 mg | ORAL_TABLET | Freq: Every evening | ORAL | Status: DC

## 2013-05-09 MED ORDER — ATORVASTATIN CALCIUM 20 MG PO TABS
20.0000 mg | ORAL_TABLET | Freq: Every day | ORAL | Status: DC
  Administered 2013-05-10: 22:00:00 20 mg via ORAL
  Filled 2013-05-09 (×2): qty 1

## 2013-05-09 MED ORDER — ONDANSETRON HCL 4 MG/2ML IJ SOLN: 4.0000 mg | Freq: Four times a day (QID) | INTRAMUSCULAR | Status: DC | PRN

## 2013-05-09 MED ORDER — ADULT MULTIVITAMIN W/MINERALS CH
1.0000 | ORAL_TABLET | Freq: Every day | ORAL | Status: DC
  Administered 2013-05-10 – 2013-05-11 (×2): 1 via ORAL
  Filled 2013-05-09 (×2): qty 1

## 2013-05-09 MED ORDER — POTASSIUM CHLORIDE IN NACL 20-0.9 MEQ/L-% IV SOLN
INTRAVENOUS | Status: AC
  Administered 2013-05-09: 23:00:00 via INTRAVENOUS
  Filled 2013-05-09: qty 1000

## 2013-05-09 MED ORDER — ONDANSETRON HCL 4 MG PO TABS: 4.0000 mg | ORAL_TABLET | Freq: Four times a day (QID) | ORAL | Status: DC | PRN

## 2013-05-09 MED ORDER — AMLODIPINE BESYLATE 5 MG PO TABS
5.0000 mg | ORAL_TABLET | Freq: Every day | ORAL | Status: DC
Start: 2013-05-10 — End: 2013-05-11
  Administered 2013-05-10 – 2013-05-11 (×2): 5 mg via ORAL
  Filled 2013-05-09 (×2): qty 1

## 2013-05-09 MED ORDER — VITAMIN B-12 1000 MCG PO TABS
1000.0000 ug | ORAL_TABLET | Freq: Every day | ORAL | Status: DC
  Administered 2013-05-10 – 2013-05-11 (×2): 1000 ug via ORAL
  Filled 2013-05-09 (×2): qty 1

## 2013-05-09 MED ORDER — ONDANSETRON HCL 4 MG/2ML IJ SOLN
4.0000 mg | Freq: Once | INTRAMUSCULAR | Status: DC
  Filled 2013-05-09: qty 2

## 2013-05-09 MED ORDER — PREDNISONE 5 MG PO TABS
5.0000 mg | ORAL_TABLET | Freq: Every day | ORAL | Status: DC
  Administered 2013-05-10 – 2013-05-11 (×2): 5 mg via ORAL
  Filled 2013-05-09 (×3): qty 1

## 2013-05-09 MED ORDER — ISOSORBIDE MONONITRATE 15 MG HALF TABLET
15.0000 mg | ORAL_TABLET | Freq: Every day | ORAL | Status: DC
  Administered 2013-05-10 – 2013-05-11 (×2): 15 mg via ORAL
  Filled 2013-05-09 (×2): qty 1

## 2013-05-09 MED ORDER — OSELTAMIVIR PHOSPHATE 75 MG PO CAPS
75.0000 mg | ORAL_CAPSULE | Freq: Two times a day (BID) | ORAL | Status: DC
  Administered 2013-05-09 – 2013-05-11 (×4): 75 mg via ORAL
  Filled 2013-05-09 (×5): qty 1

## 2013-05-09 MED ORDER — CARVEDILOL 12.5 MG PO TABS
12.5000 mg | ORAL_TABLET | Freq: Two times a day (BID) | ORAL | Status: DC
  Administered 2013-05-10 – 2013-05-11 (×3): 12.5 mg via ORAL
  Filled 2013-05-09 (×5): qty 1

## 2013-05-09 MED ORDER — SODIUM CHLORIDE 0.9 % IV BOLUS (SEPSIS)
1000.0000 mL | Freq: Once | INTRAVENOUS | Status: AC
  Administered 2013-05-09: 1000 mL via INTRAVENOUS

## 2013-05-09 MED ORDER — GUAIFENESIN-DM 100-10 MG/5ML PO SYRP
5.0000 mL | ORAL_SOLUTION | ORAL | Status: DC | PRN
  Administered 2013-05-09 – 2013-05-10 (×2): 5 mL via ORAL
  Filled 2013-05-09 (×2): qty 10

## 2013-05-09 MED ORDER — COLCHICINE 0.6 MG PO TABS
0.6000 mg | ORAL_TABLET | Freq: Every day | ORAL | Status: DC
  Administered 2013-05-10 – 2013-05-11 (×2): 0.6 mg via ORAL
  Filled 2013-05-09 (×2): qty 1

## 2013-05-09 MED ORDER — SODIUM CHLORIDE 0.9 % IV BOLUS (SEPSIS)
500.0000 mL | Freq: Once | INTRAVENOUS | Status: AC
  Administered 2013-05-09: 500 mL via INTRAVENOUS

## 2013-05-09 MED ORDER — ALBUTEROL SULFATE (2.5 MG/3ML) 0.083% IN NEBU: 2.5000 mg | INHALATION_SOLUTION | RESPIRATORY_TRACT | Status: DC | PRN

## 2013-05-09 NOTE — ED Notes (Signed)
Family at bedside.  Pt resting w/mask on face. States neck and shoulders were hurting earlier but her body aches when she moves.

## 2013-05-09 NOTE — Patient Instructions (Signed)
Please go to ED. I think you need IV fluids and further work up for fever & weakness. You may need to be admitted for a few days.

## 2013-05-09 NOTE — H&P (Signed)
PCP:   Unice Cobble, MD   Chief Complaint:  Weakness, cough, fever  HPI: 80 yo female with less than 48 hours of fever, cough, malaise and prog worsening weakness.  No n/v/d.  Some sob.  No cp.  No le edema or swelling.  Has not been eating or drinking normally.   Is so weak she cannot get out of bed.  Asked to admit for weakness, prob flu.  Review of Systems:  Positive and negative as per HPI otherwise all other systems are negative  Past Medical History: Past Medical History  Diagnosis Date  . Hyperlipidemia   . Hypertension   . Cancer     skin  . Diabetes mellitus     2  . Arthritis   . Hiatal hernia   . Hemorrhoids   . Osteoporosis   . PONV (postoperative nausea and vomiting)   . Coronary artery disease     status post post LAD angioplasty in 1993  and subsequent coronary artery bypass grafting x1 with a LIMA to the LAD July of 1994. She had stenting of her RCA with a bare-metal stent November 2002.   Past Surgical History  Procedure Laterality Date  . Coronary artery bypass graft    . Coronary angioplasty with stent placement    . Total hip arthroplasty      Medications: Prior to Admission medications   Medication Sig Start Date End Date Taking? Authorizing Provider  alendronate (FOSAMAX) 70 MG tablet Take 70 mg by mouth every Saturday. Take with a full glass of water on an empty stomach. saturday   Yes Historical Provider, MD  amLODipine (NORVASC) 5 MG tablet Take 1 tablet (5 mg total) by mouth daily. 01/16/13  Yes Hendricks Limes, MD  aspirin 81 MG tablet Take 81 mg by mouth daily.     Yes Historical Provider, MD  Calcium Carbonate-Vitamin D (CALCIUM 600-D PO) Take 1 tablet by mouth 2 (two) times daily.    Yes Historical Provider, MD  carvedilol (COREG) 12.5 MG tablet Take 12.5 mg by mouth 2 (two) times daily with a meal.   Yes Historical Provider, MD  colchicine (COLCRYS) 0.6 MG tablet Take 0.6 mg by mouth daily.   Yes Historical Provider, MD  DM-APAP-CPM  (CORICIDIN HBP FLU) 15-500-2 MG TABS Take 2 tablets by mouth every 4 (four) hours as needed (For flu symptoms.).   Yes Historical Provider, MD  isosorbide mononitrate (IMDUR) 30 MG 24 hr tablet Take 15 mg by mouth daily.   Yes Historical Provider, MD  losartan (COZAAR) 100 MG tablet Take 1 tablet (100 mg total) by mouth daily. 12/09/12  Yes Hendricks Limes, MD  Meclizine HCl 25 MG CHEW Chew 25 mg by mouth every 8 (eight) hours as needed (dizziness).   Yes Historical Provider, MD  metFORMIN (GLUCOPHAGE) 500 MG tablet Take one tab by mouth once daily with largest meal 04/12/13  Yes Hendricks Limes, MD  Multiple Vitamin (MULITIVITAMIN WITH MINERALS) TABS Take 1 tablet by mouth daily.   Yes Historical Provider, MD  predniSONE (DELTASONE) 5 MG tablet Take 5 mg by mouth daily.   Yes Historical Provider, MD  simvastatin (ZOCOR) 40 MG tablet Take 40 mg by mouth every evening.   Yes Historical Provider, MD  vitamin B-12 (CYANOCOBALAMIN) 1000 MCG tablet Take 1,000 mcg by mouth daily.     Yes Historical Provider, MD  glucose blood (ONE TOUCH ULTRA TEST) test strip Use as instructed 12/09/12   Hendricks Limes, MD  Lancets (ONETOUCH ULTRASOFT) lancets Use as instructed 12/09/12   Hendricks Limes, MD    Allergies:  No Known Allergies  Social History:  reports that she has never smoked. She has never used smokeless tobacco. She reports that she does not drink alcohol or use illicit drugs.  Family History: Family History  Problem Relation Age of Onset  . Heart disease Father   . Cancer Sister     breast  . Diabetes Sister   . Heart disease Brother   . Heart disease Brother   . Heart disease Brother   . Diabetes Sister     Physical Exam: Filed Vitals:   05/09/13 1209 05/09/13 1348 05/09/13 1723 05/09/13 1940  BP: 156/67 153/76 133/68 129/57  Pulse: 104 95 95 72  Temp: 100.4 F (38 C) 100.2 F (37.9 C) 100.1 F (37.8 C) 98.8 F (37.1 C)  TempSrc: Oral Oral Oral Oral  Resp:  17 21 20   SpO2:  96% 95% 94% 94%   General appearance: alert, cooperative and no distress Head: Normocephalic, without obvious abnormality, atraumatic Eyes: negative Nose: Nares normal. Septum midline. Mucosa normal. No drainage or sinus tenderness. Neck: no JVD and supple, symmetrical, trachea midline Lungs: clear to auscultation bilaterally Heart: regular rate and rhythm, S1, S2 normal, no murmur, click, rub or gallop Abdomen: soft, non-tender; bowel sounds normal; no masses,  no organomegaly Extremities: extremities normal, atraumatic, no cyanosis or edema Pulses: 2+ and symmetric Skin: Skin color, texture, turgor normal. No rashes or lesions Neurologic: Grossly normal   Labs on Admission:   Recent Labs  05/09/13 1609  NA 140  K 3.5*  CL 98  GLUCOSE 110*  BUN 16  CREATININE 1.00    Recent Labs  05/09/13 1555 05/09/13 1609  WBC 8.6  --   NEUTROABS 6.9  --   HGB 14.4 15.6*  HCT 43.8 46.0  MCV 93.6  --   PLT 119*  --     Radiological Exams on Admission: Dg Chest 2 View  05/09/2013   CLINICAL DATA:  WEAKNESS NAUSEA EMESIS GENERALIZED BODY ACHES  EXAM: CHEST  2 VIEW  COMPARISON:  DG SCAPULA LEFT dated 12/19/2012  FINDINGS: There is no focal parenchymal opacity, pleural effusion, or pneumothorax. The heart and mediastinal contours are unremarkable. Prior CABG.  The osseous structures are unremarkable.  IMPRESSION: No active cardiopulmonary disease.   Electronically Signed   By: Kathreen Devoid   On: 05/09/2013 15:11    Assessment/Plan  80 yo female with probable influenza and generalized weakness  Principal Problem:   Weakness generalized- obs overnight.  Mildly dehydrated will give gentle ivf overnight.  Tamiflu.  Flu pcr is pending.  Neuro exam is normal.   Active Problems:   HYPERTENSION   Polymyalgia rheumatica   Renal insufficiency, mild   Influenza    Arianny Pun A 05/09/2013, 8:59 PM

## 2013-05-09 NOTE — ED Notes (Addendum)
Pt c/o of n/v, weakness, body aches, and cough since Sunday. Pt sent from PCP thinks pt may have the flu or pneumonia.

## 2013-05-09 NOTE — ED Provider Notes (Signed)
CSN: [id number]     Arrival date & time 05/09/13  1206 History   First MD Initiated Contact with Patient 05/09/13 1457     Chief Complaint  Patient presents with  . Weakness  . Nausea  . Emesis  . Generalized Body Aches   (Consider location/radiation/quality/duration/timing/severity/associated sxs/prior Treatment) Patient is a 80 y.o. female presenting with weakness and vomiting. The history is provided by the patient.  Weakness This is a new problem. The current episode started 2 days ago. The problem occurs constantly. The problem has been gradually worsening. Pertinent negatives include no chest pain, no abdominal pain and no shortness of breath. Nothing aggravates the symptoms. Nothing relieves the symptoms.  Emesis Associated symptoms: no abdominal pain and no chills     Past Medical History  Diagnosis Date  . Hyperlipidemia   . Hypertension   . Cancer     skin  . Diabetes mellitus     2  . Arthritis   . Hiatal hernia   . Hemorrhoids   . Osteoporosis   . PONV (postoperative nausea and vomiting)   . Coronary artery disease     status post post LAD angioplasty in 1993  and subsequent coronary artery bypass grafting x1 with a LIMA to the LAD July of 1994. She had stenting of her RCA with a bare-metal stent November 2002.   Past Surgical History  Procedure Laterality Date  . Coronary artery bypass graft    . Coronary angioplasty with stent placement    . Total hip arthroplasty     Family History  Problem Relation Age of Onset  . Heart disease Father   . Cancer Sister     breast  . Diabetes Sister   . Heart disease Brother   . Heart disease Brother   . Heart disease Brother   . Diabetes Sister    History  Substance Use Topics  . Smoking status: Never Smoker   . Smokeless tobacco: Never Used  . Alcohol Use: No   OB History   Grav Para Term Preterm Abortions TAB SAB Ect Mult Living                 Review of Systems  Constitutional: Positive for fever.  Negative for chills.  Respiratory: Positive for cough. Negative for shortness of breath. Choking: wet, productive.   Cardiovascular: Negative for chest pain and leg swelling.  Gastrointestinal: Positive for nausea and vomiting (once today). Negative for abdominal pain.  Neurological: Positive for weakness.  All other systems reviewed and are negative.    Allergies  Review of patient's allergies indicates no known allergies.  Home Medications   Current Outpatient Rx  Name  Route  Sig  Dispense  Refill  . alendronate (FOSAMAX) 70 MG tablet   Oral   Take 70 mg by mouth every Saturday. Take with a full glass of water on an empty stomach. saturday         . amLODipine (NORVASC) 5 MG tablet   Oral   Take 1 tablet (5 mg total) by mouth daily.   90 tablet   1   . aspirin 81 MG tablet   Oral   Take 81 mg by mouth daily.           . Calcium Carbonate-Vitamin D (CALCIUM 600-D PO)   Oral   Take 1 tablet by mouth 2 (two) times daily.          . carvedilol (COREG) 12.5 MG tablet  Oral   Take 12.5 mg by mouth 2 (two) times daily with a meal.         . colchicine (COLCRYS) 0.6 MG tablet   Oral   Take 0.6 mg by mouth daily.         Marland Kitchen DM-APAP-CPM (CORICIDIN HBP FLU) 15-500-2 MG TABS   Oral   Take 2 tablets by mouth every 4 (four) hours as needed (For flu symptoms.).         Marland Kitchen isosorbide mononitrate (IMDUR) 30 MG 24 hr tablet   Oral   Take 15 mg by mouth daily.         Marland Kitchen losartan (COZAAR) 100 MG tablet   Oral   Take 1 tablet (100 mg total) by mouth daily.   90 tablet   3   . Meclizine HCl 25 MG CHEW   Oral   Chew 25 mg by mouth every 8 (eight) hours as needed (dizziness).         . metFORMIN (GLUCOPHAGE) 500 MG tablet      Take one tab by mouth once daily with largest meal   30 tablet   0   . Multiple Vitamin (MULITIVITAMIN WITH MINERALS) TABS   Oral   Take 1 tablet by mouth daily.         . predniSONE (DELTASONE) 5 MG tablet   Oral   Take 5 mg  by mouth daily.         . simvastatin (ZOCOR) 40 MG tablet   Oral   Take 40 mg by mouth every evening.         . vitamin B-12 (CYANOCOBALAMIN) 1000 MCG tablet   Oral   Take 1,000 mcg by mouth daily.           Marland Kitchen glucose blood (ONE TOUCH ULTRA TEST) test strip      Use as instructed   100 each   4   . Lancets (ONETOUCH ULTRASOFT) lancets      Use as instructed   100 each   4    BP 153/76  Pulse 95  Temp(Src) 100.2 F (37.9 C) (Oral)  Resp 17  SpO2 95% Physical Exam  Nursing note and vitals reviewed. Constitutional: She is oriented to person, place, and time. She appears well-developed and well-nourished. No distress.  HENT:  Head: Normocephalic and atraumatic.  Eyes: EOM are normal. Pupils are equal, round, and reactive to light.  Neck: Normal range of motion. Neck supple.  Cardiovascular: Normal rate and regular rhythm.  Exam reveals no friction rub.   No murmur heard. Pulmonary/Chest: Effort normal and breath sounds normal. No respiratory distress. She has no wheezes. She has no rales.  Abdominal: Soft. She exhibits no distension. There is no tenderness. There is no rebound.  Musculoskeletal: Normal range of motion. She exhibits no edema.  Neurological: She is alert and oriented to person, place, and time.  Skin: She is not diaphoretic.    ED Course  Procedures (including critical care time) Labs Review Labs Reviewed  CBC WITH DIFFERENTIAL  BASIC METABOLIC PANEL  URINALYSIS, ROUTINE W REFLEX MICROSCOPIC  INFLUENZA PANEL BY PCR   Imaging Review Dg Chest 2 View  05/09/2013   CLINICAL DATA:  WEAKNESS NAUSEA EMESIS GENERALIZED BODY ACHES  EXAM: CHEST  2 VIEW  COMPARISON:  DG SCAPULA LEFT dated 12/19/2012  FINDINGS: There is no focal parenchymal opacity, pleural effusion, or pneumothorax. The heart and mediastinal contours are unremarkable. Prior CABG.  The osseous structures are  unremarkable.  IMPRESSION: No active cardiopulmonary disease.   Electronically  Signed   By: Kathreen Devoid   On: 05/09/2013 15:11    EKG Interpretation    Date/Time:    Ventricular Rate:    PR Interval:    QRS Duration:   QT Interval:    QTC Calculation:   R Axis:     Text Interpretation:              MDM   1. Weakness generalized   2. DM (diabetes mellitus), secondary, with renal complications   3. Influenza   4. Polymyalgia rheumatica   5. Renal insufficiency, mild   6. Unspecified essential hypertension    55F presents with weakness, fever, body aches. Febrile here, normotensive. One episode of vomiting today. Relaxing comfortably, wet cough. Belly benign, lungs clear. CXR clear. Tamiflu given since elderly and multiple comorbidities. Fluids given, urine and labs also checked. Chest x-ray clear for pneumonia. Patient feeling mildly better after fluids, however she is unable to stand walk because of too much dizziness. Admitted for weakness related to her influenza   Osvaldo Shipper, MD 05/10/13 [id number]

## 2013-05-09 NOTE — Progress Notes (Signed)
Pre-visit discussion using our clinic review tool. No additional management support is needed unless otherwise documented below in the visit note.  

## 2013-05-09 NOTE — Progress Notes (Signed)
The order for simvastatin(Zocor) was changed to an equivalent dose of atorvastatin(Lipitor) due to the potential drug interaction with Norvasc.  When taken in combination with medications that inhibit its metabolism, simvastatin can accumulate which increases the risk of liver toxicity, myopathy, or rhabdomyolysis.  Simvastatin dose should not exceed 10mg /day in patients taking verapamil, diltiazem, fibrates, or niacin >or= 1g/day.   Simvastatin dose should not exceed 20mg /day in patients taking amlodipine, ranolazine or amiodarone.   Please consider this potential interaction at discharge.  Dorrene German 05/09/2013 10:44 PM

## 2013-05-09 NOTE — Progress Notes (Signed)
   Subjective:    Patient ID: Sheila Morgan, female    DOB: 1933-03-22, 80 y.o.   MRN: [id number]  HPI Comments: Pt accompanied by daughter who has been temporarily been living with her. 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 Today she comes into office in wheelchair as she is too weak to walk. Daughter states this is not her norm.  Sore Throat  This is a new problem. The current episode started in the past 7 days (3d). The problem has been gradually worsening. The maximum temperature recorded prior to her arrival was 100 - 100.9 F. The fever has been present for 1 to 2 days. The pain is mild. Associated symptoms include congestion (nasal & chest), coughing, headaches and vomiting (2 days). Pertinent negatives include no abdominal pain, diarrhea, shortness of breath, swollen glands or trouble swallowing. Associated symptoms comments: C/o lightheadedness. She has had no exposure to strep or mono. She has tried nothing for the symptoms.      Review of Systems  Constitutional: Positive for activity change (lethargic), appetite change (did not eat today, drank juice, but vomited after.) and fatigue (weak, unable to care for self).  HENT: Positive for congestion (nasal & chest) and sore throat. Negative for trouble swallowing.   Respiratory: Positive for cough. Negative for chest tightness, shortness of breath and wheezing.   Cardiovascular: Negative for chest pain.  Gastrointestinal: Positive for nausea and vomiting (2 days). Negative for abdominal pain and diarrhea.  Musculoskeletal: Positive for myalgias.  Neurological: Positive for dizziness, light-headedness and headaches.  Hematological: Negative for adenopathy.  Psychiatric/Behavioral:       Daughter states pt seems confused-not her norm.       Objective:   Physical Exam  Vitals reviewed. Constitutional: She is oriented to person, place, and time. She appears well-developed and well-nourished. She appears distressed  (appears extremely fatigued with slow mentation).  HENT:  Head: Normocephalic and atraumatic.  Right Ear: External ear normal.  Left Ear: External ear normal.  Mouth/Throat: Oropharynx is clear and moist. No oropharyngeal exudate.  Eyes: Conjunctivae are normal. Right eye exhibits no discharge. Left eye exhibits no discharge.  Neck: Normal range of motion. Neck supple. No thyromegaly present.  Cardiovascular: Regular rhythm and normal heart sounds.   No murmur heard. Tachycardic pulse 106.  Pulmonary/Chest: Effort normal. No respiratory distress. She has no wheezes.  Diminished BS bilat bases  Lymphadenopathy:    She has no cervical adenopathy.  Neurological: She is alert and oriented to person, place, and time.  Skin: Skin is warm and dry.  Psychiatric:  Pt is lethargic, keeps eyes closed through most of exam, but follows commands & answers questions appropriately although is slow to answer.          Assessment & Plan:  1. Weak, fever, cough, vomiting DD: flu, pneumonia, other viral illness  2 Dehydration Send to ED driven by daughter, private vehicle.

## 2013-05-09 NOTE — ED Notes (Signed)
Pt was able to get out of bed, but complained of dizziness and didn't want to walk. I checked O2 while trying to stand and it was 95

## 2013-05-10 ENCOUNTER — Other Ambulatory Visit: Payer: Self-pay | Admitting: Internal Medicine

## 2013-05-10 DIAGNOSIS — R5383 Other fatigue: Secondary | ICD-10-CM | POA: Diagnosis not present

## 2013-05-10 DIAGNOSIS — M353 Polymyalgia rheumatica: Secondary | ICD-10-CM | POA: Diagnosis not present

## 2013-05-10 DIAGNOSIS — E1329 Other specified diabetes mellitus with other diabetic kidney complication: Secondary | ICD-10-CM | POA: Diagnosis not present

## 2013-05-10 DIAGNOSIS — J111 Influenza due to unidentified influenza virus with other respiratory manifestations: Secondary | ICD-10-CM | POA: Diagnosis not present

## 2013-05-10 DIAGNOSIS — R5381 Other malaise: Secondary | ICD-10-CM | POA: Diagnosis not present

## 2013-05-10 LAB — BASIC METABOLIC PANEL
BUN: 14 mg/dL (ref 6–23)
BUN: 11 mg/dL (ref 6–23)
CALCIUM: 9.7 mg/dL (ref 8.4–10.5)
CALCIUM: 8.6 mg/dL (ref 8.4–10.5)
CO2: 29 meq/L (ref 19–32)
CO2: 23 meq/L (ref 19–32)
Chloride: 97 mEq/L (ref 96–112)
Chloride: 102 mEq/L (ref 96–112)
Creatinine, Ser: 0.91 mg/dL (ref 0.50–1.10)
Creatinine, Ser: 0.73 mg/dL (ref 0.50–1.10)
GFR calc Af Amer: >90 mL/min (ref >90)
GFR calc non Af Amer: 59 mL/min — ABNORMAL LOW (ref >90)
GFR calc non Af Amer: 80 mL/min — ABNORMAL LOW (ref >90)
GFR, EST AFRICAN AMERICAN: 68 mL/min — AB (ref >90)
Glucose, Bld: 77 mg/dL (ref 70–99)
Glucose, Bld: 82 mg/dL (ref 70–99)
Potassium: 3.5 mEq/L — ABNORMAL LOW (ref 3.7–5.3)
Potassium: 3.8 mEq/L (ref 3.7–5.3)
Sodium: 141 mEq/L (ref 137–147)
Sodium: 141 mEq/L (ref 137–147)

## 2013-05-10 LAB — CBC
HCT: 40.9 % (ref 36.0–46.0)
Hemoglobin: 13.6 g/dL (ref 12.0–15.0)
MCH: 31.2 pg (ref 26.0–34.0)
MCHC: 33.3 g/dL (ref 30.0–36.0)
MCV: 93.8 fL (ref 78.0–100.0)
PLATELETS: 106 10*3/uL — AB (ref 150–400)
RBC: 4.36 MIL/uL (ref 3.87–5.11)
RDW: 14.9 % (ref 11.5–15.5)
WBC: 6.9 10*3/uL (ref 4.0–10.5)

## 2013-05-10 LAB — GLUCOSE, CAPILLARY
GLUCOSE-CAPILLARY: 98 mg/dL (ref 70–99)
GLUCOSE-CAPILLARY: 146 mg/dL — AB (ref 70–99)
Glucose-Capillary: 60 mg/dL — ABNORMAL LOW (ref 70–99)
Glucose-Capillary: 110 mg/dL — ABNORMAL HIGH (ref 70–99)
Glucose-Capillary: 130 mg/dL — ABNORMAL HIGH (ref 70–99)

## 2013-05-10 MED ORDER — SODIUM CHLORIDE 0.9 % IV BOLUS (SEPSIS): 250.0000 mL | Freq: Once | INTRAVENOUS | Status: DC

## 2013-05-10 MED ORDER — MAGIC MOUTHWASH W/LIDOCAINE
5.0000 mL | Freq: Three times a day (TID) | ORAL | Status: DC | PRN
  Administered 2013-05-10 (×2): 5 mL via ORAL
  Filled 2013-05-10 (×2): qty 5

## 2013-05-10 NOTE — Evaluation (Addendum)
Physical Therapy Evaluation Patient Details Name: Sheila Morgan MRN: [id number] DOB: [birth year] Today's Date: 05/10/2013 Time: 0932-1000 PT Time Calculation (min): 28 min  PT Assessment / Plan / Recommendation History of Present Illness  adm. with flu like symptoms  Clinical Impression  Pt is weak but mobile. Pt will benefit from PT to address problems listed. Pt should improve to DC to home. Pt may not require HHPT- will assess.    PT Assessment  Patient needs continued PT services    Follow Up Recommendations  Home health PT    Does the patient have the potential to tolerate intense rehabilitation      Barriers to Discharge        Equipment Recommendations  None recommended by PT    Recommendations for Other Services     Frequency Min 3X/week    Precautions / Restrictions Precautions Precautions: Fall Precaution Comments: droplet   Pertinent Vitals/Pain sats 96% RA      Mobility   Bed mobility- NT Pt required min guard for sit to stand to sit at RW Ambulation/Gait Ambulation Distance (Feet): 50 Feet w/ RW min guard.   Exercises     PT Diagnosis: Difficulty walking;Generalized weakness  PT Problem List: Decreased strength;Decreased activity tolerance;Decreased safety awareness;Decreased knowledge of use of DME;Decreased cognition PT Treatment Interventions: DME instruction;Gait training;Functional mobility training;Therapeutic activities;Patient/family education     PT Goals(Current goals can be found in the care plan section) Acute Rehab PT Goals Patient Stated Goal: i want to get better and go home PT Goal Formulation: With patient Time For Goal Achievement: 05/24/13 Potential to Achieve Goals: Good  Visit Information  Last PT Received On: 05/10/13 Assistance Needed: +1 History of Present Illness: adm. with flu like symptoms       Prior [place] expects to be discharged to:: Private residence Living Arrangements:  Spouse/significant other Available Help at Discharge: Family Type of Home: House Home Access: Stairs to enter Home Layout: Two level Alternate Level Stairs-Number of Steps:  (pt has a stair glide.) Home Equipment: Cane - single point;Walker - 2 wheels Prior Function Level of Independence: Independent with assistive device(s) Communication Communication: No difficulties    Cognition  Cognition Arousal/Alertness: Awake/alert Behavior During Therapy: WFL for tasks assessed/performed Overall Cognitive Status: Within Functional Limits for tasks assessed    Extremity/Trunk Assessment Upper Extremity Assessment Upper Extremity Assessment: Generalized weakness Lower Extremity Assessment Lower Extremity Assessment: Generalized weakness Cervical / Trunk Assessment Cervical / Trunk Assessment: Normal   Balance    End of Session PT - End of Session Activity Tolerance: Patient limited by fatigue;Patient tolerated treatment well Patient left: in chair;with call bell/phone within reach Nurse Communication: Mobility status  GP Functional Assessment Tool Used: clinical judgement Functional Limitation: Mobility: Walking and moving around Mobility: Walking and Moving Around Current Status ([id number]): At least 1 percent but less than 20 percent impaired, limited or restricted Mobility: Walking and Moving Around Goal Status [phone]): 0 percent impaired, limited or restricted   Claretha Cooper 05/10/2013, 10:12 AM Tresa Endo PT [phone]

## 2013-05-10 NOTE — Progress Notes (Signed)
UR completed 

## 2013-05-10 NOTE — Progress Notes (Signed)
[facility] PROGRESS NOTE  Sheila Morgan YQI:[id number] DOB: [birth year] DOA: 05/09/2013 PCP: Unice Cobble, MD  Assessment/Plan: 1. Flu 1. Flu pos 2. On tamilflu 3. Cont supportive care and IVF as tolerated 4. CXR unremarkable 5. Afebrile currently 2. HTN 1. Stable 2. Cont current regimen 3. DM 1. FSBS stable 2. Cont SSI 4. CAD 1. Stable 2. No cp or sob 5. DVT prophylaxis 1. SCD's  Code Status: Full Family Communication: Pt in room (indicate person spoken with, relationship, and if by phone, the number) Disposition Plan: Pending  Antibiotics:  Tamiflu 05/09/13>>> (indicate start date, and stop date if known)  HPI/Subjective: No acute events. Reports feeling better but still generally weak.  Objective: Filed Vitals:   05/09/13 1723 05/09/13 1940 05/09/13 2210 05/10/13 0653  BP: 133/68 129/57 158/67 150/60  Pulse: 95 72 83 79  Temp: 100.1 F (37.8 C) 98.8 F (37.1 C) 99.4 F (37.4 C) 99.2 F (37.3 C)  TempSrc: Oral Oral Oral Oral  Resp: 21 20 20 20   Height:   5\' 6"  (1.676 m)   Weight:   73.936 kg (163 lb)   SpO2: 94% 94% 96% 97%    Intake/Output Summary (Last 24 hours) at 05/10/13 0902 Last data filed at 05/10/13 0701  Gross per 24 hour  Intake 393.75 ml  Output   1050 ml  Net -656.25 ml   Filed Weights   05/09/13 2210  Weight: 73.936 kg (163 lb)    Exam:   General:  Awake, in nad  Cardiovascular: regular, s1, s2  Respiratory: normal resp effort, no wheezing  Abdomen: soft, nondistended  Musculoskeletal: perfused, no clubbing   Data Reviewed: Basic Metabolic Panel:  Recent Labs Lab 05/09/13 1609 05/09/13 2000 05/10/13 0800  NA 140 141 141  K 3.5* 3.5* 3.8  CL 98 97 102  CO2  --  29 23  GLUCOSE 110* 77 82  BUN 16 14 11   CREATININE 1.00 0.91 0.73  CALCIUM  --  9.7 8.6   Liver Function Tests: No results found for this basename: AST, ALT, ALKPHOS, BILITOT, PROT, ALBUMIN,  in the last 168 hours No results found for this  basename: LIPASE, AMYLASE,  in the last 168 hours No results found for this basename: AMMONIA,  in the last 168 hours CBC:  Recent Labs Lab 05/09/13 1555 05/09/13 1609 05/10/13 0800  WBC 8.6  --  6.9  NEUTROABS 6.9  --   --   HGB 14.4 15.6* 13.6  HCT 43.8 46.0 40.9  MCV 93.6  --  93.8  PLT 119*  --  106*   Cardiac Enzymes: No results found for this basename: CKTOTAL, CKMB, CKMBINDEX, TROPONINI,  in the last 168 hours BNP (last 3 results) No results found for this basename: PROBNP,  in the last 8760 hours CBG:  Recent Labs Lab 05/09/13 2255 05/10/13 0726 05/10/13 0818  GLUCAP 75 60* 110*    No results found for this or any previous visit (from the past 240 hour(s)).   Studies: Dg Chest 2 View  05/09/2013   CLINICAL DATA:  WEAKNESS NAUSEA EMESIS GENERALIZED BODY ACHES  EXAM: CHEST  2 VIEW  COMPARISON:  DG SCAPULA LEFT dated 12/19/2012  FINDINGS: There is no focal parenchymal opacity, pleural effusion, or pneumothorax. The heart and mediastinal contours are unremarkable. Prior CABG.  The osseous structures are unremarkable.  IMPRESSION: No active cardiopulmonary disease.   Electronically Signed   By: Kathreen Devoid   On: 05/09/2013 15:11    Scheduled  Meds: . amLODipine  5 mg Oral Daily  . atorvastatin  20 mg Oral q1800  . carvedilol  12.5 mg Oral BID WC  . colchicine  0.6 mg Oral Daily  . isosorbide mononitrate  15 mg Oral Daily  . multivitamin with minerals  1 tablet Oral Daily  . oseltamivir  75 mg Oral BID  . predniSONE  5 mg Oral Daily  . vitamin B-12  1,000 mcg Oral Daily   Continuous Infusions: . 0.9 % NaCl with KCl 20 mEq / L 75 mL/hr at 05/09/13 2300    Principal Problem:   Weakness generalized Active Problems:   HYPERTENSION   Polymyalgia rheumatica   Renal insufficiency, mild   Influenza  Time spent: 35min  Nadean Montanaro, [facility] Pager [phone]. If 7PM-7AM, please contact night-coverage at [email or web address], password [email or web address] 05/10/2013, 9:02 AM   LOS: 1 day

## 2013-05-11 DIAGNOSIS — J111 Influenza due to unidentified influenza virus with other respiratory manifestations: Secondary | ICD-10-CM | POA: Diagnosis not present

## 2013-05-11 DIAGNOSIS — R5383 Other fatigue: Secondary | ICD-10-CM | POA: Diagnosis not present

## 2013-05-11 DIAGNOSIS — E1329 Other specified diabetes mellitus with other diabetic kidney complication: Secondary | ICD-10-CM | POA: Diagnosis not present

## 2013-05-11 DIAGNOSIS — I10 Essential (primary) hypertension: Secondary | ICD-10-CM | POA: Diagnosis not present

## 2013-05-11 DIAGNOSIS — R5381 Other malaise: Secondary | ICD-10-CM | POA: Diagnosis not present

## 2013-05-11 LAB — GLUCOSE, CAPILLARY: GLUCOSE-CAPILLARY: 90 mg/dL (ref 70–99)

## 2013-05-11 MED ORDER — OSELTAMIVIR PHOSPHATE 75 MG PO CAPS: 75.0000 mg | ORAL_CAPSULE | Freq: Two times a day (BID) | ORAL | Status: DC

## 2013-05-11 NOTE — Discharge Summary (Signed)
Physician Discharge Summary  Sheila Morgan [id number]:[id number] DOB: July 12, 1932 DOA: 05/09/2013  PCP: Unice Cobble, MD  Admit date: 05/09/2013 Discharge date: 05/11/2013  Time spent: 35 minutes  Recommendations for Outpatient Follow-up:  1. Follow up with PCP in 1-2 weeks  Discharge Diagnoses:  Principal Problem:   Weakness generalized Active Problems:   HYPERTENSION   Polymyalgia rheumatica   Renal insufficiency, mild   Influenza   Discharge Condition: Improved  Diet recommendation: diabetic  Filed Weights   05/09/13 2210  Weight: 73.936 kg (163 lb)    History of present illness:  80 yo female with less than 48 hours of fever, cough, malaise and prog worsening weakness. No n/v/d. Some sob. No cp. No le edema or swelling. Has not been eating or drinking normally. Is so weak she cannot get out of bed. Asked to admit for weakness, prob flu.  Hospital Course:  The patient was admitted to the floor. Nasal flu swab was found to be positive for influenza. The patient was started on tamilfu. She remained stable through this hospital course and on the day of d/c, reported feeling improved. The patient is medically stable for close outpatient follow up.  Discharge Exam: Filed Vitals:   05/10/13 0653 05/10/13 1342 05/10/13 2202 05/11/13 0615  BP: 150/60 148/71 133/64 168/77  Pulse: 79 74 76 81  Temp: 99.2 F (37.3 C) 98.9 F (37.2 C) 99.4 F (37.4 C) 98.9 F (37.2 C)  TempSrc: Oral  Oral Oral  Resp: 20 16 16 16   Height:      Weight:      SpO2: 97% 94% 97% 95%    General: Awake, in nad Cardiovascular: regular, s1, s2 Respiratory: normal resp effort, no wheezing  Discharge Instructions       Future Appointments Provider Department Dept Phone   06/29/2013 8:30 AM [facility] [place] at  [place]       Medication List         alendronate 70 MG tablet  Commonly known as:  FOSAMAX  Take 70 mg by mouth every Saturday. Take with a full  glass of water on an empty stomach. saturday     amLODipine 5 MG tablet  Commonly known as:  NORVASC  Take 1 tablet (5 mg total) by mouth daily.     aspirin 81 MG tablet  Take 81 mg by mouth daily.     CALCIUM 600-D PO  Take 1 tablet by mouth 2 (two) times daily.     carvedilol 12.5 MG tablet  Commonly known as:  COREG  Take 12.5 mg by mouth 2 (two) times daily with a meal.     COLCRYS 0.6 MG tablet  Generic drug:  colchicine  Take 0.6 mg by mouth daily.     CORICIDIN HBP FLU 15-500-2 MG Tabs  Generic drug:  DM-APAP-CPM  Take 2 tablets by mouth every 4 (four) hours as needed (For flu symptoms.).     glucose blood test strip  Commonly known as:  ONE TOUCH ULTRA TEST  Use as instructed     isosorbide mononitrate 30 MG 24 hr tablet  Commonly known as:  IMDUR  Take 15 mg by mouth daily.     losartan 100 MG tablet  Commonly known as:  COZAAR  Take 1 tablet (100 mg total) by mouth daily.     Meclizine HCl 25 MG Chew  Chew 25 mg by mouth every 8 (eight) hours as needed (dizziness).     metFORMIN 500  MG tablet  Commonly known as:  GLUCOPHAGE  Take one tab by mouth once daily with largest meal     multivitamin with minerals Tabs tablet  Take 1 tablet by mouth daily.     onetouch ultrasoft lancets  Use as instructed     oseltamivir 75 MG capsule  Commonly known as:  TAMIFLU  Take 1 capsule (75 mg total) by mouth 2 (two) times daily.     predniSONE 5 MG tablet  Commonly known as:  DELTASONE  Take 5 mg by mouth daily.     simvastatin 40 MG tablet  Commonly known as:  ZOCOR  Take 40 mg by mouth every evening.     vitamin B-12 1000 MCG tablet  Commonly known as:  CYANOCOBALAMIN  Take 1,000 mcg by mouth daily.       No Known Allergies Follow-up Information   Follow up with Unice Cobble, MD. Schedule an appointment as soon as possible for a visit in 2 weeks.   Specialty:  Internal Medicine   Contact information:   [phone] [address] [facility] [address]  [place] [address] [phone]        The results of significant diagnostics from this hospitalization (including imaging, microbiology, ancillary and laboratory) are listed below for reference.    Significant Diagnostic Studies: Dg Chest 2 View  05/09/2013   CLINICAL DATA:  WEAKNESS NAUSEA EMESIS GENERALIZED BODY ACHES  EXAM: CHEST  2 VIEW  COMPARISON:  DG SCAPULA LEFT dated 12/19/2012  FINDINGS: There is no focal parenchymal opacity, pleural effusion, or pneumothorax. The heart and mediastinal contours are unremarkable. Prior CABG.  The osseous structures are unremarkable.  IMPRESSION: No active cardiopulmonary disease.   Electronically Signed   By: Kathreen Devoid   On: 05/09/2013 15:11    Microbiology: No results found for this or any previous visit (from the past 240 hour(s)).   Labs: Basic Metabolic Panel:  Recent Labs Lab 05/09/13 1609 05/09/13 2000 05/10/13 0800  NA 140 141 141  K 3.5* 3.5* 3.8  CL 98 97 102  CO2  --  29 23  GLUCOSE 110* 77 82  BUN 16 14 11   CREATININE 1.00 0.91 0.73  CALCIUM  --  9.7 8.6   Liver Function Tests: No results found for this basename: AST, ALT, ALKPHOS, BILITOT, PROT, ALBUMIN,  in the last 168 hours No results found for this basename: LIPASE, AMYLASE,  in the last 168 hours No results found for this basename: AMMONIA,  in the last 168 hours CBC:  Recent Labs Lab 05/09/13 1555 05/09/13 1609 05/10/13 0800  WBC 8.6  --  6.9  NEUTROABS 6.9  --   --   HGB 14.4 15.6* 13.6  HCT 43.8 46.0 40.9  MCV 93.6  --  93.8  PLT 119*  --  106*   Cardiac Enzymes: No results found for this basename: CKTOTAL, CKMB, CKMBINDEX, TROPONINI,  in the last 168 hours BNP: BNP (last 3 results) No results found for this basename: PROBNP,  in the last 8760 hours CBG:  Recent Labs Lab 05/10/13 0818 05/10/13 1202 05/10/13 1621 05/10/13 2236 05/11/13 0738  GLUCAP 110* 98 146* 130* 90       Signed:  Karo Rog K  [facility] 05/11/2013, 9:09 AM

## 2013-05-11 NOTE — Telephone Encounter (Signed)
Rx sent to the pharmacy by e-script.//AB/CMA 

## 2013-05-11 NOTE — Care Management Note (Signed)
    Page 1 of 1   05/11/2013     5:01:34 PM   CARE MANAGEMENT NOTE 05/11/2013  Patient:  Sheila Morgan, Sheila Morgan   Account Number:  [id number]  Date Initiated:  05/11/2013  Documentation initiated by:  Sherrin Daisy  Subjective/Objective Assessment:   DX genralized weakness, mild influenza     Action/Plan:   Home upon discharge   Anticipated DC Date:  05/11/2013   Anticipated DC Plan:  [place]  CM consult      Choice offered to / List presented to:          [facility] arranged  [place].   Status of service:  Completed, signed off Medicare Important Message given?   (If response is "NO", the following Medicare IM given date fields will be blank) Date Medicare IM given:   Date Additional Medicare IM given:    Discharge Disposition:  [place]  Per UR Regulation:    If discussed at Long Length of Stay Meetings, dates discussed:    Comments:

## 2013-05-12 DIAGNOSIS — I10 Essential (primary) hypertension: Secondary | ICD-10-CM | POA: Diagnosis not present

## 2013-05-12 DIAGNOSIS — IMO0001 Reserved for inherently not codable concepts without codable children: Secondary | ICD-10-CM | POA: Diagnosis not present

## 2013-05-12 DIAGNOSIS — E119 Type 2 diabetes mellitus without complications: Secondary | ICD-10-CM | POA: Diagnosis not present

## 2013-05-12 DIAGNOSIS — I25810 Atherosclerosis of coronary artery bypass graft(s) without angina pectoris: Secondary | ICD-10-CM | POA: Diagnosis not present

## 2013-05-12 DIAGNOSIS — J111 Influenza due to unidentified influenza virus with other respiratory manifestations: Secondary | ICD-10-CM | POA: Diagnosis not present

## 2013-05-15 DIAGNOSIS — IMO0001 Reserved for inherently not codable concepts without codable children: Secondary | ICD-10-CM | POA: Diagnosis not present

## 2013-05-15 DIAGNOSIS — I25810 Atherosclerosis of coronary artery bypass graft(s) without angina pectoris: Secondary | ICD-10-CM | POA: Diagnosis not present

## 2013-05-15 DIAGNOSIS — I10 Essential (primary) hypertension: Secondary | ICD-10-CM | POA: Diagnosis not present

## 2013-05-15 DIAGNOSIS — E119 Type 2 diabetes mellitus without complications: Secondary | ICD-10-CM | POA: Diagnosis not present

## 2013-05-15 DIAGNOSIS — J111 Influenza due to unidentified influenza virus with other respiratory manifestations: Secondary | ICD-10-CM | POA: Diagnosis not present

## 2013-05-18 DIAGNOSIS — IMO0001 Reserved for inherently not codable concepts without codable children: Secondary | ICD-10-CM | POA: Diagnosis not present

## 2013-05-18 DIAGNOSIS — E119 Type 2 diabetes mellitus without complications: Secondary | ICD-10-CM | POA: Diagnosis not present

## 2013-05-18 DIAGNOSIS — I25810 Atherosclerosis of coronary artery bypass graft(s) without angina pectoris: Secondary | ICD-10-CM | POA: Diagnosis not present

## 2013-05-18 DIAGNOSIS — J111 Influenza due to unidentified influenza virus with other respiratory manifestations: Secondary | ICD-10-CM | POA: Diagnosis not present

## 2013-05-18 DIAGNOSIS — I10 Essential (primary) hypertension: Secondary | ICD-10-CM | POA: Diagnosis not present

## 2013-05-19 ENCOUNTER — Ambulatory Visit (INDEPENDENT_AMBULATORY_CARE_PROVIDER_SITE_OTHER): Payer: Medicare Other | Admitting: Internal Medicine

## 2013-05-19 VITALS — BP 143/76 | HR 89 | Temp 98.0°F | Resp 14 | Wt 164.0 lb

## 2013-05-19 DIAGNOSIS — R42 Dizziness and giddiness: Secondary | ICD-10-CM

## 2013-05-19 DIAGNOSIS — E1329 Other specified diabetes mellitus with other diabetic kidney complication: Secondary | ICD-10-CM

## 2013-05-19 DIAGNOSIS — M11262 Other chondrocalcinosis, left knee: Secondary | ICD-10-CM

## 2013-05-19 DIAGNOSIS — H8110 Benign paroxysmal vertigo, unspecified ear: Secondary | ICD-10-CM | POA: Diagnosis not present

## 2013-05-19 DIAGNOSIS — J111 Influenza due to unidentified influenza virus with other respiratory manifestations: Secondary | ICD-10-CM | POA: Diagnosis not present

## 2013-05-19 DIAGNOSIS — M11869 Other specified crystal arthropathies, unspecified knee: Secondary | ICD-10-CM

## 2013-05-19 NOTE — Progress Notes (Signed)
Subjective:    Patient ID: Sheila Morgan, female    DOB: Apr 01, 1933, 80 y.o.   MRN: [id number]  HPI S/P influenza A hospitalization; Had flu shot, treated with Tamiflu. Is here today d/t increased dizziness, which was recurring before influenza symptoms. Hx significant for fall in May, where patient fell and broke L wrist and thumb. Reports dizziness upon standing and walking; rare true vertigo. Also describes BPV symptoms intermittently.   Review of Systems Blood pressure not monitored Compliant with anti hypertemsive medication. Significant headaches, epistaxis, chest pain, palpitations, exertional dyspnea, claudication, paroxysmal nocturnal dyspnea, or edema absent. Fasting or morning glucose < 150. Highest glucose 2 hours after any meal is not monitored. No hypoglycemia reported                                                                                                            No regular exercise due to dizziness. No specific nutrition/diet followed except low salt Medication compliance is good. No medication adverse effects noted.  A1c 7.3% in 11/14 No excess thirst ;  excess hunger ; or excess urination reported                              No non healing skin  ulcers or sores of extremities noted. No numbness or tingling or burning in feet described                                                                                                                                             No significant change in weight  No blurred,double, or loss of vision reported  .         Objective:   Physical Exam Gen.: well-nourished in appearance. Alert, appropriate and cooperative throughout exam. Appears younger than stated age  Head: Normocephalic without obvious abnormalities  Eyes: No corneal or conjunctival inflammation noted. Pupils equal round reactive to light and accommodation. Extraocular motion intact. No nystagmus Ears: External  ear exam reveals no significant  lesions or deformities. Canals clear .TMs normal.  Nose: External nasal exam reveals no deformity or inflammation. Nasal mucosa are pink and moist. No lesions or exudates noted.  Mouth: Oral mucosa and oropharynx reveal no lesions or exudates. Partials in good repair. Neck: No deformities, masses, or tenderness noted. Range of motion decreased. Lungs: Normal respiratory effort; chest expands symmetrically. Lungs are  clear to auscultation without rales, wheezes, or increased work of breathing. Heart: Normal rate and rhythm. Normal S1 and S2. No gallop, click, or rub. No murmur. Abdomen: Bowel sounds normal; abdomen soft and nontender. No masses, organomegaly or hernias noted.                                   Musculoskeletal/extremities: Accentuated curvature of upper thoracic spine.  No clubbing, cyanosis, or edema noted. Range of motion decreased @ knees .Proximal extremity muscle strength decreased.She pushes off from the chair to stand Hand joints  reveal PIP fusiform & flexion changes. Fingernail health good.  Able to lie down & sit up with minimal help. Negative SLR bilaterally Vascular: Carotid, radial artery, dorsalis pedis and  posterior tibial pulses are full and equal. No bruits present. Neurologic: Alert and oriented x3. Deep tendon reflexes symmetrical and normal.         Skin: Intact without suspicious lesions or rashes. Lymph: No cervical, axillary lymphadenopathy present. Psych: Mood and affect are normal. Normally interactive                              Orthostatic BPs:  Supine:150/58 Sitting:142/80 Standing:148/70 Change in pulse:none                                                                                                                                      Assessment & Plan:  See Current Assessment & Plan in Problem List under specific Diagnosis

## 2013-05-19 NOTE — Assessment & Plan Note (Signed)
To perform isometric exercise of calves  ( while seated go up on toes to count of 5 & then onto heels for 5 count). Repeat  4- 5 times prior to standing if  seated or supine for any significant period of time

## 2013-05-19 NOTE — Assessment & Plan Note (Signed)
Physical Therapy assessment

## 2013-05-19 NOTE — Assessment & Plan Note (Addendum)
Verify with Rheumatologist as to need for oral steroids (? For Pseudo Gout)

## 2013-05-19 NOTE — Patient Instructions (Signed)
Go to Web M[employer] for information on benign positional vertigo (BPV) . Physical therapy exercises can treat that. Repeat the isometric exercises discussed 4- 5 times prior to standing if you've been seated for a period of time.

## 2013-05-19 NOTE — Progress Notes (Signed)
Pre visit review using our clinic review tool, if applicable. No additional management support is needed unless otherwise documented below in the visit note. 

## 2013-05-20 ENCOUNTER — Encounter: Payer: Self-pay | Admitting: Internal Medicine

## 2013-05-20 NOTE — Assessment & Plan Note (Signed)
See notation under PMR Verify need for chronic oral steroid

## 2013-05-22 ENCOUNTER — Telehealth: Payer: Self-pay

## 2013-05-22 DIAGNOSIS — I25810 Atherosclerosis of coronary artery bypass graft(s) without angina pectoris: Secondary | ICD-10-CM | POA: Diagnosis not present

## 2013-05-22 DIAGNOSIS — IMO0001 Reserved for inherently not codable concepts without codable children: Secondary | ICD-10-CM | POA: Diagnosis not present

## 2013-05-22 DIAGNOSIS — I10 Essential (primary) hypertension: Secondary | ICD-10-CM | POA: Diagnosis not present

## 2013-05-22 DIAGNOSIS — E119 Type 2 diabetes mellitus without complications: Secondary | ICD-10-CM | POA: Diagnosis not present

## 2013-05-22 DIAGNOSIS — J111 Influenza due to unidentified influenza virus with other respiratory manifestations: Secondary | ICD-10-CM | POA: Diagnosis not present

## 2013-05-22 NOTE — Telephone Encounter (Signed)
Relevant patient education mailed to patient.  

## 2013-05-23 DIAGNOSIS — I10 Essential (primary) hypertension: Secondary | ICD-10-CM | POA: Diagnosis not present

## 2013-05-23 DIAGNOSIS — J111 Influenza due to unidentified influenza virus with other respiratory manifestations: Secondary | ICD-10-CM | POA: Diagnosis not present

## 2013-05-23 DIAGNOSIS — IMO0001 Reserved for inherently not codable concepts without codable children: Secondary | ICD-10-CM | POA: Diagnosis not present

## 2013-05-23 DIAGNOSIS — I25810 Atherosclerosis of coronary artery bypass graft(s) without angina pectoris: Secondary | ICD-10-CM | POA: Diagnosis not present

## 2013-05-23 DIAGNOSIS — E119 Type 2 diabetes mellitus without complications: Secondary | ICD-10-CM | POA: Diagnosis not present

## 2013-05-24 ENCOUNTER — Telehealth: Payer: Self-pay | Admitting: Internal Medicine

## 2013-05-24 ENCOUNTER — Other Ambulatory Visit: Payer: Self-pay | Admitting: *Deleted

## 2013-05-24 DIAGNOSIS — I25810 Atherosclerosis of coronary artery bypass graft(s) without angina pectoris: Secondary | ICD-10-CM | POA: Diagnosis not present

## 2013-05-24 DIAGNOSIS — J111 Influenza due to unidentified influenza virus with other respiratory manifestations: Secondary | ICD-10-CM | POA: Diagnosis not present

## 2013-05-24 DIAGNOSIS — I10 Essential (primary) hypertension: Secondary | ICD-10-CM | POA: Diagnosis not present

## 2013-05-24 DIAGNOSIS — E119 Type 2 diabetes mellitus without complications: Secondary | ICD-10-CM | POA: Diagnosis not present

## 2013-05-24 DIAGNOSIS — W19XXXA Unspecified fall, initial encounter: Secondary | ICD-10-CM

## 2013-05-24 DIAGNOSIS — IMO0001 Reserved for inherently not codable concepts without codable children: Secondary | ICD-10-CM | POA: Diagnosis not present

## 2013-05-24 NOTE — Telephone Encounter (Signed)
Referral placed. JG//CMA

## 2013-05-24 NOTE — Telephone Encounter (Signed)
Merry Proud with [facility] is calling to verify what the patient's caregiver told him. He states that the p's care giver advised him that the patient had been dx with Vertigo and Dr. Linna Darner wanted them to begin treating her for this at home. Please verify for insurance purposes.

## 2013-05-24 NOTE — Telephone Encounter (Signed)
Benign positional vertigo warrants physical therapy intervention to prevent recurrent falls

## 2013-05-24 NOTE — Telephone Encounter (Signed)
Should I enter referral for physical therapy?

## 2013-05-26 DIAGNOSIS — I25810 Atherosclerosis of coronary artery bypass graft(s) without angina pectoris: Secondary | ICD-10-CM | POA: Diagnosis not present

## 2013-05-26 DIAGNOSIS — IMO0001 Reserved for inherently not codable concepts without codable children: Secondary | ICD-10-CM | POA: Diagnosis not present

## 2013-05-26 DIAGNOSIS — J111 Influenza due to unidentified influenza virus with other respiratory manifestations: Secondary | ICD-10-CM | POA: Diagnosis not present

## 2013-05-26 DIAGNOSIS — I10 Essential (primary) hypertension: Secondary | ICD-10-CM | POA: Diagnosis not present

## 2013-05-26 DIAGNOSIS — E119 Type 2 diabetes mellitus without complications: Secondary | ICD-10-CM | POA: Diagnosis not present

## 2013-05-30 DIAGNOSIS — E119 Type 2 diabetes mellitus without complications: Secondary | ICD-10-CM | POA: Diagnosis not present

## 2013-05-30 DIAGNOSIS — J111 Influenza due to unidentified influenza virus with other respiratory manifestations: Secondary | ICD-10-CM | POA: Diagnosis not present

## 2013-05-30 DIAGNOSIS — I25810 Atherosclerosis of coronary artery bypass graft(s) without angina pectoris: Secondary | ICD-10-CM | POA: Diagnosis not present

## 2013-05-30 DIAGNOSIS — IMO0001 Reserved for inherently not codable concepts without codable children: Secondary | ICD-10-CM | POA: Diagnosis not present

## 2013-05-30 DIAGNOSIS — I10 Essential (primary) hypertension: Secondary | ICD-10-CM | POA: Diagnosis not present

## 2013-06-01 DIAGNOSIS — I10 Essential (primary) hypertension: Secondary | ICD-10-CM | POA: Diagnosis not present

## 2013-06-01 DIAGNOSIS — IMO0001 Reserved for inherently not codable concepts without codable children: Secondary | ICD-10-CM | POA: Diagnosis not present

## 2013-06-01 DIAGNOSIS — I25810 Atherosclerosis of coronary artery bypass graft(s) without angina pectoris: Secondary | ICD-10-CM | POA: Diagnosis not present

## 2013-06-01 DIAGNOSIS — J111 Influenza due to unidentified influenza virus with other respiratory manifestations: Secondary | ICD-10-CM | POA: Diagnosis not present

## 2013-06-01 DIAGNOSIS — E119 Type 2 diabetes mellitus without complications: Secondary | ICD-10-CM | POA: Diagnosis not present

## 2013-06-06 DIAGNOSIS — I10 Essential (primary) hypertension: Secondary | ICD-10-CM | POA: Diagnosis not present

## 2013-06-06 DIAGNOSIS — J111 Influenza due to unidentified influenza virus with other respiratory manifestations: Secondary | ICD-10-CM | POA: Diagnosis not present

## 2013-06-06 DIAGNOSIS — IMO0001 Reserved for inherently not codable concepts without codable children: Secondary | ICD-10-CM | POA: Diagnosis not present

## 2013-06-06 DIAGNOSIS — I25810 Atherosclerosis of coronary artery bypass graft(s) without angina pectoris: Secondary | ICD-10-CM | POA: Diagnosis not present

## 2013-06-06 DIAGNOSIS — E119 Type 2 diabetes mellitus without complications: Secondary | ICD-10-CM | POA: Diagnosis not present

## 2013-06-08 DIAGNOSIS — E119 Type 2 diabetes mellitus without complications: Secondary | ICD-10-CM | POA: Diagnosis not present

## 2013-06-08 DIAGNOSIS — I25810 Atherosclerosis of coronary artery bypass graft(s) without angina pectoris: Secondary | ICD-10-CM | POA: Diagnosis not present

## 2013-06-08 DIAGNOSIS — J111 Influenza due to unidentified influenza virus with other respiratory manifestations: Secondary | ICD-10-CM | POA: Diagnosis not present

## 2013-06-08 DIAGNOSIS — IMO0001 Reserved for inherently not codable concepts without codable children: Secondary | ICD-10-CM | POA: Diagnosis not present

## 2013-06-08 DIAGNOSIS — I10 Essential (primary) hypertension: Secondary | ICD-10-CM | POA: Diagnosis not present

## 2013-06-13 ENCOUNTER — Encounter: Payer: Self-pay | Admitting: Internal Medicine

## 2013-06-13 ENCOUNTER — Ambulatory Visit (INDEPENDENT_AMBULATORY_CARE_PROVIDER_SITE_OTHER): Payer: Medicare Other | Admitting: Internal Medicine

## 2013-06-13 VITALS — BP 158/70 | HR 85 | Temp 97.6°F | Resp 13 | Wt 164.6 lb

## 2013-06-13 DIAGNOSIS — H9209 Otalgia, unspecified ear: Secondary | ICD-10-CM | POA: Diagnosis not present

## 2013-06-13 DIAGNOSIS — H9312 Tinnitus, left ear: Secondary | ICD-10-CM

## 2013-06-13 DIAGNOSIS — R42 Dizziness and giddiness: Secondary | ICD-10-CM

## 2013-06-13 DIAGNOSIS — H9202 Otalgia, left ear: Secondary | ICD-10-CM

## 2013-06-13 DIAGNOSIS — H9319 Tinnitus, unspecified ear: Secondary | ICD-10-CM | POA: Diagnosis not present

## 2013-06-13 NOTE — Patient Instructions (Signed)
Repeat the isometric exercises discussed 4- 5 times prior to standing if you've been seated for a period of time.  

## 2013-06-13 NOTE — Assessment & Plan Note (Signed)
ENT assessment ;Neuro referral if negative

## 2013-06-13 NOTE — Progress Notes (Signed)
Pre visit review using our clinic review tool, if applicable. No additional management support is needed unless otherwise documented below in the visit note. 

## 2013-06-13 NOTE — Progress Notes (Signed)
   Subjective:    Patient ID: Sheila Morgan, female    DOB: Aug 24, 1932, 80 y.o.   MRN: [id number]  HPI She has seen the physical therapist on six occasions for possible benign positional vertigo. She is not experiencing benefit from physical therapy or meclizine as needed.  Her symptoms now are those for which she was seen in emergency room May 2014 EXCEPT no  vertigo upon arising & attempting to ambulate since  The only difference in that episode and present episode is that she now has some tinnitus & L ear pain within past 3 weeks.    Review of Systems  The episodes have not been associated with any neurologic for cardiac prodrome. Specifically she is not having change in heart rhythm or rate. She is not having headache, limb weakness, numbness, tingling  She has no associated blurred vision, double vision, loss of vision.     Objective:   Physical Exam  Orthostatic BP: 150/78 supine;148/80 sitting; & standing 148/68 w/o change in pulse. Gen.: Healthy and well-nourished in appearance. Alert, appropriate and cooperative throughout exam. Appears younger than stated age  Head: Normocephalic without obvious abnormalities  Eyes: No corneal or conjunctival inflammation noted. Pupils equal round reactive to light and accommodation. Extraocular motion intact. No nystagmus Ears: External  ear exam reveals no significant lesions or deformities. Canals clear .R TM dull.Decreased hearing bilaterally.Tuning fork exam normal.  Nose: External nasal exam reveals no deformity or inflammation. Nasal mucosa are pink and moist. No lesions or exudates noted.  Mouth: Oral mucosa and oropharynx reveal no lesions or exudates. Teeth in good repair; upper & lower partials.  Neck: No deformities, masses, or tenderness noted. Range of motion decreased Lungs: Normal respiratory effort; chest expands symmetrically. Lungs are clear to auscultation without rales, wheezes, or increased work of breathing. Heart:  Normal rate and rhythm. Normal S1 and S2. No gallop, click, or rub. No murmur.  Musculoskeletal/extremities: Unsteady broad gait; using cane No clubbing, cyanosis, edema, or significant extremity  deformity noted. Range of motion normal .Tone & strength normal.  Left 4th digit with flexion contracture Able to lie down & sit up with help.  Vascular: Carotid, radial artery, dorsalis pedis and  posterior tibial pulses are full and equal. No bruits present. Neurologic: Alert and oriented x3. Deep tendon reflexes symmetrical and normal.     Skin: Intact without suspicious lesions or rashes. Lymph: No cervical, axillary lymphadenopathy present. Psych: Mood and affect are normal. Normally interactive                                                                                        Assessment & Plan:  #1 dizziness, postural w/o BP drop #2 isolated tinnitus & ear pain See orders

## 2013-06-26 DIAGNOSIS — H905 Unspecified sensorineural hearing loss: Secondary | ICD-10-CM | POA: Diagnosis not present

## 2013-06-26 DIAGNOSIS — H9190 Unspecified hearing loss, unspecified ear: Secondary | ICD-10-CM | POA: Diagnosis not present

## 2013-06-26 DIAGNOSIS — H9319 Tinnitus, unspecified ear: Secondary | ICD-10-CM | POA: Diagnosis not present

## 2013-06-26 DIAGNOSIS — R269 Unspecified abnormalities of gait and mobility: Secondary | ICD-10-CM | POA: Diagnosis not present

## 2013-06-29 ENCOUNTER — Other Ambulatory Visit: Payer: Medicare Other

## 2013-07-03 ENCOUNTER — Other Ambulatory Visit: Payer: Self-pay | Admitting: Internal Medicine

## 2013-07-10 ENCOUNTER — Other Ambulatory Visit (INDEPENDENT_AMBULATORY_CARE_PROVIDER_SITE_OTHER): Payer: Medicare Other

## 2013-07-10 DIAGNOSIS — E1329 Other specified diabetes mellitus with other diabetic kidney complication: Secondary | ICD-10-CM

## 2013-07-10 LAB — MICROALBUMIN / CREATININE URINE RATIO
CREATININE, U: 131.8 mg/dL
Microalb Creat Ratio: 1.6 mg/g (ref 0.0–30.0)
Microalb, Ur: 2.1 mg/dL — ABNORMAL HIGH (ref 0.0–1.9)

## 2013-07-10 LAB — HEMOGLOBIN A1C: Hgb A1c MFr Bld: 6.7 % — ABNORMAL HIGH (ref 4.6–6.5)

## 2013-07-17 ENCOUNTER — Encounter: Payer: Self-pay | Admitting: *Deleted

## 2013-08-03 DIAGNOSIS — M25569 Pain in unspecified knee: Secondary | ICD-10-CM | POA: Diagnosis not present

## 2013-08-07 ENCOUNTER — Ambulatory Visit (INDEPENDENT_AMBULATORY_CARE_PROVIDER_SITE_OTHER): Payer: Medicare Other | Admitting: Internal Medicine

## 2013-08-07 ENCOUNTER — Encounter: Payer: Self-pay | Admitting: Internal Medicine

## 2013-08-07 VITALS — BP 160/90 | HR 73 | Temp 96.5°F | Wt 164.8 lb

## 2013-08-07 DIAGNOSIS — IMO0002 Reserved for concepts with insufficient information to code with codable children: Secondary | ICD-10-CM

## 2013-08-07 DIAGNOSIS — M1710 Unilateral primary osteoarthritis, unspecified knee: Secondary | ICD-10-CM | POA: Diagnosis not present

## 2013-08-07 DIAGNOSIS — I10 Essential (primary) hypertension: Secondary | ICD-10-CM

## 2013-08-07 DIAGNOSIS — M1712 Unilateral primary osteoarthritis, left knee: Secondary | ICD-10-CM

## 2013-08-07 DIAGNOSIS — E1129 Type 2 diabetes mellitus with other diabetic kidney complication: Secondary | ICD-10-CM | POA: Diagnosis not present

## 2013-08-07 NOTE — Patient Instructions (Signed)
Minimal Blood Pressure Goal= AVERAGE < 140/90;  Ideal is an AVERAGE < 135/85. This AVERAGE should be calculated from @ least 5-7 BP readings taken @ different times of day on different days of week. You should not respond to isolated BP readings , but rather the AVERAGE for that week .Please bring your  blood pressure cuff to office visits to verify that it is reliable.It  can also be checked against the blood pressure device at the pharmacy. Finger or wrist cuffs are not dependable; an arm cuff is. INCREASE Carvedilol to 12.5 mg one & 1/2 twice a day if BP NOT @ goal based on  7 to 14 day average.

## 2013-08-07 NOTE — Progress Notes (Signed)
Pre visit review using our clinic review tool, if applicable. No additional management support is needed unless otherwise documented below in the visit note. 

## 2013-08-07 NOTE — Progress Notes (Signed)
   Subjective:    Patient ID: Sheila Morgan, female    DOB: 25-May-1932, 80 y.o.   MRN: [id number]  HPI: She is scheduled to have left knee surgery 10/09/13 and needs clearance for surgery (Dr. Noemi Chapel)    Review of Systems  Constitutional: Negative for fever, chills, diaphoresis, fatigue and unexpected weight change.  HENT: Negative for sinus pressure, sneezing and sore throat.   Eyes: Positive for discharge (watery eyes for over a year). Negative for itching and visual disturbance.  Respiratory: Positive for cough (Has had cold. Cough is improving. Prodictive occasionally for clear sputum.). Negative for shortness of breath.   Neurological: Positive for dizziness and light-headedness.   States she has had a cold but it is better now Has headache in front of ears and above forehead. Continues to have dizziness especially when standing quickly.  Has had steroid injections x 1 on each knee in past year. These helped for approximately a week or two. Has soreness (4/10) in left knee with ambulation. Very hard for her to climb stairs due to soreness.  Will see cardiologist 5/5 for surgical clearance. HYPERTENSION: Disease Monitoring: Not checking because meter broken.   Medication Compliance: yes  FASTING HYPERGLYCEMIA OR Diabetes :  FBS range/average: checks am (60-105)  Highest 2 hr post meal glucose: 150s Medication compliance: yes Hypoglycemia: no Ophthamology care: 15 months ago Podiatry care: does not check feet, states he has not had any problems   Chest pain, palpitations:  no     Dyspnea: no Edema: none in ankles, feet Claudication: no Lightheadedness,Syncope: has dizziness often. Saw ENT for this and evaluation was negative. Weight gain/loss: no Polyuria/phagia/dipsia:     no Blurred vision /diplopia/lossof vision: no, complains of watery eyes Limb numbness/tingling/burning: no Non healing skin lesions: no Abd pain, bowel changes:  no Myalgias: has joint pain due to  OA Memory loss: no      Objective:   Physical Exam  Constitutional: She is oriented to person, place, and time. She appears well-developed and well-nourished. No distress.  HENT:  Head: Normocephalic and atraumatic.  Right Ear: Tympanic membrane normal.  Left Ear: Tympanic membrane normal.  Nose: Nose normal.  Mouth/Throat: Oropharynx is clear and moist. No oropharyngeal exudate.  Eyes: Pupils are equal, round, and reactive to light. Right eye exhibits no discharge. Left eye exhibits no discharge. No scleral icterus.  Neck: Normal range of motion. Neck supple. No tracheal deviation present. No thyromegaly present.  Cardiovascular: Normal rate, regular rhythm, normal heart sounds and intact distal pulses.   Pulmonary/Chest: Effort normal and breath sounds normal. No respiratory distress.  Abdominal: Soft. Bowel sounds are normal. There is no tenderness.  Musculoskeletal: She exhibits edema (bilateral knees) and tenderness.  Lymphadenopathy:    She has no cervical adenopathy.  Neurological: She is alert and oriented to person, place, and time.  Skin: Skin is warm and dry. She is not diaphoretic.  Psychiatric: She has a normal mood and affect.   PIP joint enlargement. Significant crepitus left knee.       Assessment & Plan:  See Current Assessment & Plan in Problem List under specific Diagnosis

## 2013-08-07 NOTE — Progress Notes (Signed)
   Subjective:    Patient ID: Sheila Morgan, female    DOB: 04-Jan-1933, 80 y.o.   MRN: [id number]  HPI She is scheduled to have left knee surgery 10/09/13 and needs clearance for surgery by Dr. Noemi Chapel.She has had steroid injections x 1 on each knee in past year. These helped for approximately a week or two. Has soreness (4/10) in left knee with ambulation. Very hard for her to climb stairs due to soreness.  Will also see cardiologist 5/5 for surgical clearance.  Continues to have dizziness especially when standing quickly.   Medical issues: HYPERTENSION:  Disease Monitoring: Not checking because meter broken.  Medication Compliance: yes  FASTING HYPERGLYCEMIA OR Diabetes :  FBS range/average: checks am (60-105)  Highest 2 hr post meal glucose: 150s  Medication compliance: yes  Hypoglycemia: no  Ophthamology care: 15 months ago  Podiatry care: does not check feet, states he has not had any problems .  Chest pain, palpitations: no  Dyspnea: no  Edema: none in ankles, feet  Claudication: no  Weight gain/loss: no  Polyuria/phagia/dipsia: no  Blurred vision /diplopia/lossof vision: no, complains of watery eyes  Limb numbness/tingling/burning: no  Non healing skin lesions: no  Abd pain, bowel changes: no  Myalgias: has joint pain due to OA  Memory loss: no      Review of Systems States she has had a cold but it is better now  Has headache in front of ears and above forehead.   Constitutional: Negative for fever, chills, diaphoresis, fatigue and unexpected weight change.  HENT: Negative for sinus pressure, sneezing and sore throat.  Eyes: Positive for discharge (watery eyes for over a year). Negative for itching and visual disturbance.  Respiratory: Positive for cough (Has had cold. Cough is improving. Prodictive occasionally for clear sputum.). Negative for shortness of breath.  Neurological: Positive for dizziness and light-headedness. Saw ENT for this and evaluation was  negative for inner ear issues.     Objective:   Physical Exam   Constitutional: She appears well-developed and well-nourished. No distress.  Head: Normocephalic and atraumatic.  Right Ear: Tympanic membrane normal.  Left Ear: Tympanic membrane normal.  Nose: Nose normal.  Mouth/Throat: Oropharynx is clear and moist. No oropharyngeal exudate.  Eyes: Pupils are equal, round, and reactive to light. Right eye exhibits no discharge. Left eye exhibits no discharge. No scleral icterus.  Neck: Normal range of motion. Neck supple. No tracheal deviation present. No thyromegaly present.  Cardiovascular: Normal rate, regular rhythm, normal heart sounds and intact distal pulses.  Pulmonary/Chest: Effort normal and breath sounds normal. No respiratory distress.  Abdominal: Soft. Bowel sounds are normal. There is no tenderness.  Musculoskeletal: She exhibits fusiform changes of knees with tenderness & crepitus. PIP enlargement Lymphadenopathy:  She has no cervical or axillary adenopathy.  Neurological: She is alert and oriented to person, place, and time.  Skin: Skin is warm and dry. She is not diaphoretic.  Psychiatric: She has a normal mood and affect.            Assessment & Plan:  #1 end-stage degenerative joint disease of the knees. No response to steroid injections.  #2 hypertension, uncontrolled. Blood pressure control necessary prior to elective surgery  #3 diabetes, adequate control.  She'll be cleared for surgery once blood pressure is controlled.

## 2013-08-08 ENCOUNTER — Telehealth: Payer: Self-pay | Admitting: Internal Medicine

## 2013-08-08 DIAGNOSIS — M810 Age-related osteoporosis without current pathological fracture: Secondary | ICD-10-CM | POA: Diagnosis not present

## 2013-08-08 DIAGNOSIS — L738 Other specified follicular disorders: Secondary | ICD-10-CM | POA: Diagnosis not present

## 2013-08-08 DIAGNOSIS — M1990 Unspecified osteoarthritis, unspecified site: Secondary | ICD-10-CM | POA: Diagnosis not present

## 2013-08-08 DIAGNOSIS — L678 Other hair color and hair shaft abnormalities: Secondary | ICD-10-CM | POA: Diagnosis not present

## 2013-08-08 NOTE — Telephone Encounter (Signed)
Relevant patient education mailed to patient.  

## 2013-08-09 ENCOUNTER — Telehealth: Payer: Self-pay | Admitting: *Deleted

## 2013-08-09 NOTE — Telephone Encounter (Signed)
Chart reviewed by Dr Gwenlyn Found.  He cleared patient for surgery and ok to hold ASA.  Form faxed back to [facility]

## 2013-08-21 DIAGNOSIS — E119 Type 2 diabetes mellitus without complications: Secondary | ICD-10-CM | POA: Diagnosis not present

## 2013-08-22 ENCOUNTER — Ambulatory Visit (INDEPENDENT_AMBULATORY_CARE_PROVIDER_SITE_OTHER): Payer: Medicare Other | Admitting: Internal Medicine

## 2013-08-22 ENCOUNTER — Encounter: Payer: Self-pay | Admitting: Internal Medicine

## 2013-08-22 VITALS — BP 160/80 | HR 76 | Temp 97.0°F | Resp 15 | Wt 163.0 lb

## 2013-08-22 DIAGNOSIS — I10 Essential (primary) hypertension: Secondary | ICD-10-CM

## 2013-08-22 MED ORDER — CARVEDILOL 25 MG PO TABS: 25.0000 mg | ORAL_TABLET | Freq: Two times a day (BID) | ORAL | Status: DC

## 2013-08-22 NOTE — Progress Notes (Signed)
   Subjective:    Patient ID: Sheila Morgan, female    DOB: May 31, 1932, 80 y.o.   MRN: [id number]  HPI  Blood pressure range / average : 160/95; 140-150s/80s/90s after medications Compliant with anti hypertemsive medication: amlodipine 5 mg, carvedilol 12.5 mg, losartan 100mg   No lightheadedness or other adverse medication effect described.  A heart healthy /low salt diet is followed. Exercise is limited due to knee pain. TKR scheduled for L knee on 10/09/2013 Family history is positive for HTN /CVA.  She questions whether her elevated blood pressures are related to stress related to primary caretaker for her husband.  She reports daily temporal headaches; she does not take anything for them.   Review of Systems Significant epistaxis, chest pain, palpitations, exertional dyspnea, claudication, paroxysmal nocturnal dyspnea, or edema absent.     Objective:   Physical Exam General appearance is one of good health and nourishment w/o distress.  Eyes: No conjunctival inflammation or scleral icterus is present.  Oral exam: Dental hygiene is good; lips and gums are healthy appearing.There is no oropharyngeal erythema or exudate noted.   Heart:  Normal rate and regular rhythm. S1 and S2 normal without gallop, murmur, click, rub or other extra sounds    Lungs:Chest clear to auscultation; no wheezes, rhonchi,rales ,or rubs present.No increased work of breathing.   Abdomen: bowel sounds normal, soft and non-tender without masses, organomegaly or hernias noted.  No guarding or rebound . No tenderness over the flanks to percussion  Musculoskeletal: Able to lie flat and sit up without help. Negative straight leg raising bilaterally. Gait normal  Skin:Warm & dry.  Intact without suspicious lesions or rashes ; no jaundice or tenting  Lymphatic: No lymphadenopathy is noted about the head, neck, axilla, or inguinal areas.      Assessment & Plan:  #1 hypertension, uncontrolled: increase Coreg  1.5 tabs twice daily.  #2 T2DM, controlled last A1C 6.7 on 07/10/13.

## 2013-08-22 NOTE — Progress Notes (Signed)
   Subjective:    Patient ID: Sheila Morgan, female    DOB: [birth year], 80 y.o.   MRN: [id number]  HPI Blood pressure range / average : 160/95; 140-150s/80s/90s after medications  Compliant with anti hypertemsive medication: amlodipine 5 mg, carvedilol 12.5 mg, losartan 100mg   No lightheadedness or other adverse medication effect described.  A heart healthy /low salt diet is followed.  Exercise is limited due to knee pain. TKR scheduled for L knee on 10/09/2013 . She "worries" about being able to have surgery due to HTN Family history is positive for HTN /CVA.  She questions whether her elevated blood pressures are related to stress related to primary caretaker for her husband who is very demanding. Violence or abuse denied by her & daughter   Review of Systems Significant epistaxis, chest pain, palpitations, exertional dyspnea, claudication, paroxysmal nocturnal dyspnea, or edema absent. She reports daily temporal headaches; she does not take anything for them.       Objective:   Physical Exam General appearance is one of good health and nourishment w/o distress. Appears younger than stated age; well groomed & stylishly dressed Eyes: No conjunctival inflammation or scleral icterus is present.  Oral exam: Dental hygiene is good; lips and gums are healthy appearing.There is no oropharyngeal erythema or exudate noted.  Heart: Normal rate and regular rhythm. S1 and S2 normal without gallop, murmur, click, rub or other extra sounds  Lungs:Chest clear to auscultation; no wheezes, rhonchi,rales ,or rubs present.No increased work of breathing.  Abdomen: bowel sounds normal, soft and non-tender without masses, organomegaly or hernias noted. No guarding or rebound. No tenderness over the flanks to percussion  Musculoskeletal: Able to lie flat and sit up without help. Negative straight leg raising bilaterally. Gait normal  Skin:Warm & dry. Intact without suspicious lesions or rashes ; no jaundice or  tenting  Lymphatic: No lymphadenopathy is noted about the head, neck, axilla, or inguinal areas.  Psych: slightly anxious when discussing stressors     Assessment & Plan:  #1 hypertension, uncontrolled #2 exogenous stress; options discussed #3 T2DM, controlled - last A1C 6.7 on 07/10/13 See AVS

## 2013-08-22 NOTE — Progress Notes (Signed)
Pre visit review using our clinic review tool, if applicable. No additional management support is needed unless otherwise documented below in the visit note. 

## 2013-08-22 NOTE — Patient Instructions (Addendum)
Increase Carvedilol to 25 mg twice a day. Minimal Blood Pressure Goal= AVERAGE < 150/90;  Ideal is an AVERAGE < 135/85. This AVERAGE should be calculated from @ least 5-7 BP readings taken @ different times of day on different days of week. You should not respond to isolated BP readings , but rather the AVERAGE for that week .Please bring your  blood pressure cuff to office visits to verify that it is reliable.It  can also be checked against the blood pressure device at the pharmacy.

## 2013-08-25 ENCOUNTER — Telehealth: Payer: Self-pay

## 2013-08-25 NOTE — Telephone Encounter (Signed)
Relevant patient education mailed to patient.  

## 2013-09-05 ENCOUNTER — Ambulatory Visit (INDEPENDENT_AMBULATORY_CARE_PROVIDER_SITE_OTHER): Payer: Medicare Other | Admitting: Cardiology

## 2013-09-05 ENCOUNTER — Ambulatory Visit: Payer: Medicare Other | Admitting: Cardiology

## 2013-09-05 ENCOUNTER — Encounter: Payer: Self-pay | Admitting: Cardiology

## 2013-09-05 VITALS — BP 142/70 | HR 79 | Ht 67.0 in | Wt 164.9 lb

## 2013-09-05 DIAGNOSIS — I2510 Atherosclerotic heart disease of native coronary artery without angina pectoris: Secondary | ICD-10-CM

## 2013-09-05 NOTE — Patient Instructions (Signed)
Your physician recommends that you schedule a follow-up appointment in:  6 months with Dr. Gwenlyn Found

## 2013-09-08 DIAGNOSIS — Z01419 Encounter for gynecological examination (general) (routine) without abnormal findings: Secondary | ICD-10-CM | POA: Diagnosis not present

## 2013-09-08 DIAGNOSIS — R82998 Other abnormal findings in urine: Secondary | ICD-10-CM | POA: Diagnosis not present

## 2013-09-08 DIAGNOSIS — D649 Anemia, unspecified: Secondary | ICD-10-CM | POA: Diagnosis not present

## 2013-09-08 DIAGNOSIS — Z0000 Encounter for general adult medical examination without abnormal findings: Secondary | ICD-10-CM | POA: Diagnosis not present

## 2013-09-13 NOTE — Progress Notes (Signed)
Patient ID: Sheila Morgan, female   DOB: 1933/01/13, 80 y.o.   MRN: [id number]    09/05/2013 Sheila Morgan   02-27-1933  [id number]  Primary Physicia Unice Cobble, MD Primary Cardiologist: Dr. Gwenlyn Found  HPI:  The patient is a 80 year old mildly overweight married African American, followed by Dr. Gwenlyn Found. She has a history of CAD, status post remote 1-vessel coronary artery bypass grafting with a LIMA to her LAD in 1994. Dr. Gwenlyn Found re-catheterized her in November of 2002 revealing a patent LIMA to the LAD with high-grade distal RCA disease which he stented using a 2.75 x 8 mm long BX Philosophy bare metal stent. She had a Myoview stress test performed March 14, 2012 which was entirely normal.  Her other problems include hypertension, hyperlipidemia, non-insulin-requiring diabetes. Her last OV with Dr. Gwenlyn Found was 6 months ago on 03/13/2013. At that time, she denies any chest pain and SOB and was felt stable from a cardiac standpoint. He instructed her to return back in 6 months for repeat evaluation.  She presents back to clinic today for routien follow-up and also for cardiac clearance, as she is anticipating undergoing a left TKR for OA in June. She is accompanied by her daughter. She states that she has been doing well. She denies any chest pain/pressure/tightness/heaviness. No SOB, orthopnea, PND or LEE. She does note occasional dizziness, but denies syncope/ near syncope and palpitations. She states that she has undergone extensive w/u by her PCP, Dr. Linna Darner, for this and has been told that her symptoms are due to vertigo. She reports full medication compliance.  Today in clinic, her BP is 142/70. Pulse is 79.    Current Outpatient Prescriptions  Medication Sig Dispense Refill  . alendronate (FOSAMAX) 70 MG tablet Take 70 mg by mouth every Saturday. Take with a full glass of water on an empty stomach. saturday      . amLODipine (NORVASC) 5 MG tablet TAKE 1 TABLET BY MOUTH DAILY  90 tablet  3    . aspirin 81 MG tablet Take 81 mg by mouth daily.        . Calcium Carbonate-Vitamin D (CALCIUM 600-D PO) Take 1 tablet by mouth 2 (two) times daily.       . carvedilol (COREG) 25 MG tablet Take 1 tablet (25 mg total) by mouth 2 (two) times daily with a meal.  60 tablet  3  . colchicine (COLCRYS) 0.6 MG tablet Take 0.6 mg by mouth daily.      Marland Kitchen glucose blood (ONE TOUCH ULTRA TEST) test strip Use as instructed  100 each  4  . isosorbide mononitrate (IMDUR) 30 MG 24 hr tablet Take 15 mg by mouth daily.      . Lancets (ONETOUCH ULTRASOFT) lancets Use as instructed  100 each  4  . losartan (COZAAR) 100 MG tablet Take 1 tablet (100 mg total) by mouth daily.  90 tablet  3  . metFORMIN (GLUCOPHAGE) 500 MG tablet TAKE 1 TABLET BY MOUTH ONCE DAILY WITH LARGEST MEAL  30 tablet  5  . Multiple Vitamin (MULITIVITAMIN WITH MINERALS) TABS Take 1 tablet by mouth daily.      . predniSONE (DELTASONE) 5 MG tablet Take 5 mg by mouth daily.      . simvastatin (ZOCOR) 40 MG tablet Take 40 mg by mouth every evening.      . vitamin B-12 (CYANOCOBALAMIN) 1000 MCG tablet Take 1,000 mcg by mouth daily.         No current  facility-administered medications for this visit.    No Known Allergies  History   Social History  . Marital Status: Married    Spouse Name: N/A    Number of Children: N/A  . Years of Education: N/A   Occupational History  . Not on file.   Social History Main Topics  . Smoking status: Never Smoker   . Smokeless tobacco: Never Used  . Alcohol Use: No  . Drug Use: No  . Sexual Activity: Not Currently   Other Topics Concern  . Not on file   Social History Narrative  . No narrative on file     Review of Systems: General: negative for chills, fever, night sweats or weight changes.  Cardiovascular: negative for chest pain, dyspnea on exertion, edema, orthopnea, palpitations, paroxysmal nocturnal dyspnea or shortness of breath Dermatological: negative for rash Respiratory: negative  for cough or wheezing Urologic: negative for hematuria Abdominal: negative for nausea, vomiting, diarrhea, bright red blood per rectum, melena, or hematemesis Neurologic: negative for visual changes, syncope, or dizziness All other systems reviewed and are otherwise negative except as noted above.    Blood pressure 142/70, pulse 79, height 5\' 7"  (1.702 m), weight 164 lb 14.4 oz (74.798 kg).  General appearance: alert, cooperative and no distress Neck: no carotid bruit and no JVD Heart: regular rate and rhythm and 1/6 SM murmur Extremities: no LEE Pulses: 2+ and symmetric Skin: warm and dry Neurologic: Grossly normal  EKG: NSR w/o ischemic changes  ASSESSMENT AND PLAN:    CORONARY ARTERY BYPASS GRAFT, HX OF  status post LAD angioplasty in 1993, coronary artery bypass grafting x1 with a LIMA to the LAD July of 1994 and stenting of her RCA with a bare-metal stent in 2002. Her last Myoview performed in November 2013 was normal. She denies chest pain or shortness of breath. EKG is NSR w/o ischemic changes. Continue current medications: ASA, BB, ARB, Nitrate and statin.  HYPERTENSION  Her blood pressure today is slightly elevated but not concerning. Her goal, in the setting of DM is <130/80. She is on antihypertensive medications and reports compliance.She is in chronic pain because of back and knee pain and this may be contributing. Will keep on current antihypertensive doses for now. Pt instructed to monitor at home.  HYPERLIPIDEMIA  On statin therapy with recent lipid profile revealing a total cholesterol of 57, LDL of 66 and HDL of 72. Continue simvastatin.  DM Followed by PCP   PLAN  Patient appears stable from a cardiac standpoint. Will continue current meds as prescribed. Refills were written. I discussed with Dr. Gwenlyn Found her need for left TKR. We reviewed her charts together. With normal NST in November 2013 and no recurrent chest pain and normal EKG, he has determined that she is  likely low risk and he has verbally cleared her for surgery in June. She has been instructed to f/u with Dr. Gwenlyn Found in 6 months for repeat evaluation, or sooner if problems occur.   Regino Fournet SimmonsPA-C 09/13/2013 12:32 PM

## 2013-09-14 ENCOUNTER — Encounter: Payer: Self-pay | Admitting: Internal Medicine

## 2013-09-26 ENCOUNTER — Encounter (HOSPITAL_COMMUNITY): Payer: Self-pay | Admitting: Pharmacy Technician

## 2013-09-27 ENCOUNTER — Other Ambulatory Visit (HOSPITAL_COMMUNITY): Payer: Medicare Other

## 2013-09-27 ENCOUNTER — Other Ambulatory Visit: Payer: Self-pay | Admitting: Physician Assistant

## 2013-09-27 ENCOUNTER — Encounter: Payer: Self-pay | Admitting: Physician Assistant

## 2013-09-27 DIAGNOSIS — I2510 Atherosclerotic heart disease of native coronary artery without angina pectoris: Secondary | ICD-10-CM | POA: Insufficient documentation

## 2013-09-27 DIAGNOSIS — R112 Nausea with vomiting, unspecified: Secondary | ICD-10-CM

## 2013-09-27 DIAGNOSIS — M1712 Unilateral primary osteoarthritis, left knee: Secondary | ICD-10-CM | POA: Insufficient documentation

## 2013-09-27 DIAGNOSIS — M25569 Pain in unspecified knee: Secondary | ICD-10-CM | POA: Diagnosis not present

## 2013-09-27 DIAGNOSIS — Z9889 Other specified postprocedural states: Secondary | ICD-10-CM

## 2013-09-27 DIAGNOSIS — E785 Hyperlipidemia, unspecified: Secondary | ICD-10-CM | POA: Insufficient documentation

## 2013-09-27 DIAGNOSIS — M810 Age-related osteoporosis without current pathological fracture: Secondary | ICD-10-CM

## 2013-09-27 DIAGNOSIS — C801 Malignant (primary) neoplasm, unspecified: Secondary | ICD-10-CM | POA: Insufficient documentation

## 2013-09-27 DIAGNOSIS — I10 Essential (primary) hypertension: Secondary | ICD-10-CM | POA: Insufficient documentation

## 2013-09-27 DIAGNOSIS — K449 Diaphragmatic hernia without obstruction or gangrene: Secondary | ICD-10-CM

## 2013-09-27 DIAGNOSIS — M1990 Unspecified osteoarthritis, unspecified site: Secondary | ICD-10-CM | POA: Insufficient documentation

## 2013-09-27 NOTE — H&P (Signed)
TOTAL KNEE ADMISSION H&P  Patient is being admitted for left total knee arthroplasty.  Subjective:  Chief Complaint:left knee pain.  HPI: Sheila Morgan, 81 y.o. female, has a history of pain and functional disability in the left knee due to arthritis and has failed non-surgical conservative treatments for greater than 12 weeks to includeNSAID's and/or analgesics, corticosteriod injections, viscosupplementation injections, flexibility and strengthening excercises, supervised PT with diminished ADL's post treatment, use of assistive devices, weight reduction as appropriate and activity modification.  Onset of symptoms was gradual, starting 10 years ago with gradually worsening course since that time. The patient noted no past surgery on the left knee(s).  Patient currently rates pain in the left knee(s) at 10 out of 10 with activity. Patient has night pain, worsening of pain with activity and weight bearing, pain that interferes with activities of daily living, crepitus and joint swelling.  Patient has evidence of subchondral sclerosis, periarticular osteophytes and joint space narrowing by imaging studies.There is no active infection.  Patient Active Problem List   Diagnosis Date Noted  . Hyperlipidemia   . Hypertension   . Cancer   . Arthritis   . Hiatal hernia   . Osteoporosis   . PONV (postoperative nausea and vomiting)   . Coronary artery disease   . Left knee DJD   . Benign paroxysmal positional vertigo 05/19/2013  . Weakness generalized 05/09/2013  . Influenza 05/09/2013  . Pseudogout of left knee 03/29/2013  . Acute right lumbar radiculopathy 03/18/2013  . Type II or unspecified type diabetes mellitus with renal manifestations, not stated as uncontrolled 03/18/2013  . Shoulder impingement syndrome 01/25/2013  . Vertigo 10/17/2012  . Renal insufficiency, mild 06/20/2011  . HYPERTENSION 12/31/2009  . OSTEOPOROSIS 12/31/2009  . SKIN CANCER, HX OF 12/31/2009  . Proteinuria  01/05/2008  . UNSPECIFIED ANEMIA 12/21/2007  . BLOOD IN STOOL, OCCULT 12/21/2007  . HYPERLIPIDEMIA 09/08/2006  . Polymyalgia rheumatica 09/08/2006  . ALKALINE PHOSPHATASE, ELEVATED 09/08/2006  . CORONARY ARTERY BYPASS GRAFT, HX OF 09/08/2006   Past Medical History  Diagnosis Date  . Hyperlipidemia   . Hypertension   . Cancer     skin  . Diabetes mellitus     2  . Arthritis   . Hiatal hernia   . Hemorrhoids   . Osteoporosis   . PONV (postoperative nausea and vomiting)   . Coronary artery disease     status post post LAD angioplasty in 1993  and subsequent coronary artery bypass grafting x1 with a LIMA to the LAD July of 1994. She had stenting of her RCA with a bare-metal stent November 2002.  . Left knee DJD     Past Surgical History  Procedure Laterality Date  . Coronary artery bypass graft    . Coronary angioplasty with stent placement    . Total hip arthroplasty       (Not in a hospital admission) No Known Allergies   Current Outpatient Prescriptions on File Prior to Visit  Medication Sig Dispense Refill  . alendronate (FOSAMAX) 70 MG tablet Take 70 mg by mouth every Saturday. Take with a full glass of water on an empty stomach. saturday      . amLODipine (NORVASC) 5 MG tablet Take 5 mg by mouth daily.      . aspirin 81 MG tablet Take 81 mg by mouth daily.        . Calcium Carbonate-Vitamin D (CALCIUM 600-D PO) Take 1 tablet by mouth 2 (two) times daily.       .   carvedilol (COREG) 25 MG tablet Take 1 tablet (25 mg total) by mouth 2 (two) times daily with a meal.  60 tablet  3  . colchicine (COLCRYS) 0.6 MG tablet Take 0.6 mg by mouth daily.      . glucose blood (ONE TOUCH ULTRA TEST) test strip Use as instructed  100 each  4  . isosorbide mononitrate (IMDUR) 30 MG 24 hr tablet Take 15 mg by mouth daily.      . losartan (COZAAR) 100 MG tablet Take 1 tablet (100 mg total) by mouth daily.  90 tablet  3  . metFORMIN (GLUCOPHAGE) 500 MG tablet Take 500 mg by mouth daily  with breakfast.      . Multiple Vitamin (MULITIVITAMIN WITH MINERALS) TABS Take 1 tablet by mouth daily.      . predniSONE (DELTASONE) 5 MG tablet Take 5 mg by mouth daily.      . simvastatin (ZOCOR) 40 MG tablet Take 40 mg by mouth every evening.      . Lancets (ONETOUCH ULTRASOFT) lancets Use as instructed  100 each  4  . vitamin B-12 (CYANOCOBALAMIN) 1000 MCG tablet Take 1,000 mcg by mouth daily.         No current facility-administered medications on file prior to visit.    History  Substance Use Topics  . Smoking status: Never Smoker   . Smokeless tobacco: Never Used  . Alcohol Use: No    Family History  Problem Relation Age of Onset  . Heart disease Father   . Cancer Sister     breast  . Diabetes Sister   . Heart disease Brother   . Heart disease Brother   . Heart disease Brother   . Diabetes Sister      Review of Systems  Constitutional: Negative.   Eyes: Negative.   Respiratory: Negative.   Cardiovascular: Negative.   Gastrointestinal: Negative.   Genitourinary: Negative.   Musculoskeletal: Positive for joint pain.       Bilateral knee pain left worse than right  Skin: Negative.   Neurological: Negative.   Endo/Heme/Allergies: Negative.   Psychiatric/Behavioral: Negative.     Objective:  Physical Exam  Constitutional: She is oriented to person, place, and time. She appears well-developed and well-nourished.  HENT:  Head: Normocephalic and atraumatic.  Eyes: Conjunctivae are normal. Pupils are equal, round, and reactive to light.  Neck: Neck supple.  Cardiovascular: Normal rate and regular rhythm.   Respiratory: Effort normal and breath sounds normal.  GI: Soft. Bowel sounds are normal.  Genitourinary:  Not pertinent to current symptomatology therefore not examined.  Musculoskeletal:  Examination of both knees reveals pain  bilaterally, she has moderate swelling in the right knee more than the left knee but the left knee bothers her overall more. She  has range of motion -5 to 125 degrees, knees are stable with normal patella tracking and diffuse pain. Vascular exam: pulses 2+ and symmetric.  Neurological: She is alert and oriented to person, place, and time.  Skin: Skin is warm and dry.  Psychiatric: She has a normal mood and affect.    Vital signs in last 24 hours: Last recorded: 05/27 1300   BP: 155/81 Pulse: 74  Temp: 98 F (36.7 C)    Height: 5' 5" (1.651 m) SpO2: 95  Weight: 74.39 kg (164 lb)     Labs:   Estimated body mass index is 27.29 kg/(m^2) as calculated from the following:   Height as of this encounter: 5' 5" (  1.651 m).   Weight as of this encounter: 74.39 kg (164 lb).   Imaging Review Plain radiographs demonstrate severe degenerative joint disease of the left knee(s). The overall alignment issignificant valgus. The bone quality appears to be fair for age and reported activity level.  Assessment/Plan:  End stage arthritis, left knee  Patient Active Problem List   Diagnosis Date Noted  . Hyperlipidemia   . Hypertension   . Cancer   . Arthritis   . Hiatal hernia   . Osteoporosis   . PONV (postoperative nausea and vomiting)   . Coronary artery disease   . Left knee DJD   . Benign paroxysmal positional vertigo 05/19/2013  . Weakness generalized 05/09/2013  . Influenza 05/09/2013  . Pseudogout of left knee 03/29/2013  . Acute right lumbar radiculopathy 03/18/2013  . Type II or unspecified type diabetes mellitus with renal manifestations, not stated as uncontrolled 03/18/2013  . Shoulder impingement syndrome 01/25/2013  . Vertigo 10/17/2012  . Renal insufficiency, mild 06/20/2011  . HYPERTENSION 12/31/2009  . OSTEOPOROSIS 12/31/2009  . SKIN CANCER, HX OF 12/31/2009  . Proteinuria 01/05/2008  . UNSPECIFIED ANEMIA 12/21/2007  . BLOOD IN STOOL, OCCULT 12/21/2007  . HYPERLIPIDEMIA 09/08/2006  . Polymyalgia rheumatica 09/08/2006  . ALKALINE PHOSPHATASE, ELEVATED 09/08/2006  . CORONARY ARTERY BYPASS  GRAFT, HX OF 09/08/2006     The patient history, physical examination, clinical judgment of the provider and imaging studies are consistent with end stage degenerative joint disease of the left knee(s) and total knee arthroplasty is deemed medically necessary. The treatment options including medical management, injection therapy arthroscopy and arthroplasty were discussed at length. The risks and benefits of total knee arthroplasty were presented and reviewed. The risks due to aseptic loosening, infection, stiffness, patella tracking problems, thromboembolic complications and other imponderables were discussed. The patient acknowledged the explanation, agreed to proceed with the plan and consent was signed. Patient is being admitted for inpatient treatment for surgery, pain control, PT, OT, prophylactic antibiotics, VTE prophylaxis, progressive ambulation and ADL's and discharge planning. The patient is planning to be discharged to skilled nursing facility [place] or [place]  Jovie Swanner A. Sam Overbeck, PA-C Physician Assistant [facility] [phone]  09/27/2013, 2:50 PM   

## 2013-09-28 ENCOUNTER — Encounter (HOSPITAL_COMMUNITY): Payer: Self-pay

## 2013-09-28 ENCOUNTER — Encounter (HOSPITAL_COMMUNITY)
Admission: RE | Admit: 2013-09-28 | Discharge: 2013-09-28 | Disposition: A | Discharge status: Home / Self Care | Payer: Medicare Other | Source: Ambulatory Visit | Attending: Orthopedic Surgery | Admitting: Orthopedic Surgery

## 2013-09-28 DIAGNOSIS — Z01812 Encounter for preprocedural laboratory examination: Secondary | ICD-10-CM | POA: Insufficient documentation

## 2013-09-28 HISTORY — DX: Cardiac murmur, unspecified: R01.1

## 2013-09-28 HISTORY — DX: Personal history of other medical treatment: Z92.89

## 2013-09-28 HISTORY — DX: Dizziness and giddiness: R42

## 2013-09-28 HISTORY — DX: Unspecified hearing loss, unspecified ear: H91.90

## 2013-09-28 LAB — CBC WITH DIFFERENTIAL/PLATELET
Basophils Absolute: 0 10*3/uL (ref 0.0–0.1)
Basophils Relative: 0 % (ref 0–1)
Eosinophils Absolute: 0.1 10*3/uL (ref 0.0–0.7)
Eosinophils Relative: 1 % (ref 0–5)
HCT: 39.7 % (ref 36.0–46.0)
HEMOGLOBIN: 13.6 g/dL (ref 12.0–15.0)
LYMPHS ABS: 1.1 10*3/uL (ref 0.7–4.0)
LYMPHS PCT: 13 % (ref 12–46)
MCH: 31.6 pg (ref 26.0–34.0)
MCHC: 34.3 g/dL (ref 30.0–36.0)
MCV: 92.1 fL (ref 78.0–100.0)
MONO ABS: 1 10*3/uL (ref 0.1–1.0)
MONOS PCT: 12 % (ref 3–12)
NEUTROS ABS: 6.5 10*3/uL (ref 1.7–7.7)
Neutrophils Relative %: 74 % (ref 43–77)
Platelets: 194 10*3/uL (ref 150–400)
RBC: 4.31 MIL/uL (ref 3.87–5.11)
RDW: 14.4 % (ref 11.5–15.5)
WBC: 8.7 10*3/uL (ref 4.0–10.5)

## 2013-09-28 LAB — URINALYSIS, ROUTINE W REFLEX MICROSCOPIC
Bilirubin Urine: NEGATIVE
Glucose, UA: NEGATIVE mg/dL
Hgb urine dipstick: NEGATIVE
Ketones, ur: NEGATIVE mg/dL
NITRITE: NEGATIVE
PH: 5.5 (ref 5.0–8.0)
Protein, ur: NEGATIVE mg/dL
SPECIFIC GRAVITY, URINE: 1.016 (ref 1.005–1.030)
UROBILINOGEN UA: 0.2 mg/dL (ref 0.0–1.0)

## 2013-09-28 LAB — COMPREHENSIVE METABOLIC PANEL
ALT: 71 U/L — ABNORMAL HIGH (ref 0–35)
AST: 43 U/L — ABNORMAL HIGH (ref 0–37)
Albumin: 4.1 g/dL (ref 3.5–5.2)
Alkaline Phosphatase: 89 U/L (ref 39–117)
BILIRUBIN TOTAL: 0.4 mg/dL (ref 0.3–1.2)
BUN: 24 mg/dL — AB (ref 6–23)
CHLORIDE: 101 meq/L (ref 96–112)
CO2: 27 mEq/L (ref 19–32)
CREATININE: 1.2 mg/dL — AB (ref 0.50–1.10)
Calcium: 10.4 mg/dL (ref 8.4–10.5)
GFR calc Af Amer: 48 mL/min — ABNORMAL LOW (ref >90)
GFR, EST NON AFRICAN AMERICAN: 42 mL/min — AB (ref >90)
Glucose, Bld: 96 mg/dL (ref 70–99)
Potassium: 5.1 mEq/L (ref 3.7–5.3)
Sodium: 140 mEq/L (ref 137–147)
Total Protein: 6.8 g/dL (ref 6.0–8.3)

## 2013-09-28 LAB — SURGICAL PCR SCREEN
MRSA, PCR: NEGATIVE
Staphylococcus aureus: NEGATIVE

## 2013-09-28 LAB — APTT: APTT: 26 s (ref 24–37)

## 2013-09-28 LAB — PROTIME-INR
INR: 0.94 (ref 0.00–1.49)
Prothrombin Time: 12.4 seconds (ref 11.6–15.2)

## 2013-09-28 LAB — URINE MICROSCOPIC-ADD ON

## 2013-09-28 NOTE — Pre-Procedure Instructions (Signed)
Sheila Morgan  09/28/2013   Your procedure is scheduled on:  Monday, June 8.  Report to [facility] Admitting at 5:30 AM.  Call this number if you have problems the morning of surgery: [phone]     Remember:   Do not eat food or drink liquids after midnight Sunday, June 7.   Take these medicines the morning of surgery with A SIP OF WATER: amLODipine (NORVASC),carvedilol (COREG), isosorbide mononitrate),  predniSONE (DELTASONE).                Stop taking [place]) On June 1.   Do not wear jewelry, make-up or nail polish.  Do not wear lotions, powders, or perfume.  Do not shave 48 hours prior to surgery.              Do not bring valuables to the hospital.               [name] is not responsible for any belongings or valuables.               Contacts, dentures or bridgework may not be worn into surgery.  Leave suitcase in the car. After surgery it may be brought to your room.  For patients admitted to the hospital, discharge time is determined by your  treatment team.               Special Instructions: Review  [name] - Preparing For Surgery.   Please read over the following fact sheets that you were given: Pain Booklet, Coughing and Deep Breathing, Blood Transfusion Information and Surgical Site Infection Prevention

## 2013-09-28 NOTE — Progress Notes (Signed)
Mrs Irmen' cardiologist is Dr Gwenlyn Found.  Dr Gwenlyn Found instructed patient to stop Aspirin 10/03/13.

## 2013-09-29 LAB — URINE CULTURE

## 2013-09-29 LAB — ABO/RH: ABO/RH(D): AB POS

## 2013-10-08 MED ORDER — CEFAZOLIN SODIUM-DEXTROSE 2-3 GM-% IV SOLR
2.0000 g | INTRAVENOUS | Status: AC
  Administered 2013-10-09: 2 g via INTRAVENOUS
  Filled 2013-10-08: qty 50

## 2013-10-09 ENCOUNTER — Inpatient Hospital Stay (HOSPITAL_COMMUNITY): Payer: Medicare Other | Admitting: Critical Care Medicine

## 2013-10-09 ENCOUNTER — Encounter (HOSPITAL_COMMUNITY): Payer: Medicare Other | Admitting: Critical Care Medicine

## 2013-10-09 ENCOUNTER — Inpatient Hospital Stay (HOSPITAL_COMMUNITY)
Admission: RE | Admit: 2013-10-09 | Discharge: 2013-10-12 | DRG: 470 | Disposition: A | Discharge status: Skilled Nursing Facility | Payer: Medicare Other | Source: Ambulatory Visit | Attending: Orthopedic Surgery | Admitting: Orthopedic Surgery

## 2013-10-09 ENCOUNTER — Encounter (HOSPITAL_COMMUNITY)
Admission: RE | Disposition: A | Discharge status: Skilled Nursing Facility | Payer: Self-pay | Source: Ambulatory Visit | Attending: Orthopedic Surgery

## 2013-10-09 ENCOUNTER — Encounter (HOSPITAL_COMMUNITY): Payer: Self-pay | Admitting: *Deleted

## 2013-10-09 DIAGNOSIS — D62 Acute posthemorrhagic anemia: Secondary | ICD-10-CM | POA: Diagnosis not present

## 2013-10-09 DIAGNOSIS — I2510 Atherosclerotic heart disease of native coronary artery without angina pectoris: Secondary | ICD-10-CM | POA: Diagnosis present

## 2013-10-09 DIAGNOSIS — I10 Essential (primary) hypertension: Secondary | ICD-10-CM | POA: Diagnosis not present

## 2013-10-09 DIAGNOSIS — Z96659 Presence of unspecified artificial knee joint: Secondary | ICD-10-CM | POA: Diagnosis not present

## 2013-10-09 DIAGNOSIS — M6281 Muscle weakness (generalized): Secondary | ICD-10-CM | POA: Diagnosis not present

## 2013-10-09 DIAGNOSIS — Z7982 Long term (current) use of aspirin: Secondary | ICD-10-CM | POA: Diagnosis not present

## 2013-10-09 DIAGNOSIS — IMO0002 Reserved for concepts with insufficient information to code with codable children: Secondary | ICD-10-CM

## 2013-10-09 DIAGNOSIS — Z79899 Other long term (current) drug therapy: Secondary | ICD-10-CM | POA: Diagnosis not present

## 2013-10-09 DIAGNOSIS — E785 Hyperlipidemia, unspecified: Secondary | ICD-10-CM | POA: Diagnosis present

## 2013-10-09 DIAGNOSIS — S8990XA Unspecified injury of unspecified lower leg, initial encounter: Secondary | ICD-10-CM | POA: Diagnosis not present

## 2013-10-09 DIAGNOSIS — D649 Anemia, unspecified: Secondary | ICD-10-CM | POA: Diagnosis present

## 2013-10-09 DIAGNOSIS — Z9861 Coronary angioplasty status: Secondary | ICD-10-CM

## 2013-10-09 DIAGNOSIS — N289 Disorder of kidney and ureter, unspecified: Secondary | ICD-10-CM | POA: Diagnosis present

## 2013-10-09 DIAGNOSIS — M353 Polymyalgia rheumatica: Secondary | ICD-10-CM | POA: Diagnosis present

## 2013-10-09 DIAGNOSIS — G8918 Other acute postprocedural pain: Secondary | ICD-10-CM | POA: Diagnosis not present

## 2013-10-09 DIAGNOSIS — M810 Age-related osteoporosis without current pathological fracture: Secondary | ICD-10-CM | POA: Diagnosis present

## 2013-10-09 DIAGNOSIS — E1129 Type 2 diabetes mellitus with other diabetic kidney complication: Secondary | ICD-10-CM | POA: Diagnosis present

## 2013-10-09 DIAGNOSIS — S99929A Unspecified injury of unspecified foot, initial encounter: Secondary | ICD-10-CM | POA: Diagnosis not present

## 2013-10-09 DIAGNOSIS — M1710 Unilateral primary osteoarthritis, unspecified knee: Principal | ICD-10-CM | POA: Diagnosis present

## 2013-10-09 DIAGNOSIS — N182 Chronic kidney disease, stage 2 (mild): Secondary | ICD-10-CM | POA: Diagnosis not present

## 2013-10-09 DIAGNOSIS — M1712 Unilateral primary osteoarthritis, left knee: Secondary | ICD-10-CM

## 2013-10-09 DIAGNOSIS — Z951 Presence of aortocoronary bypass graft: Secondary | ICD-10-CM | POA: Diagnosis not present

## 2013-10-09 DIAGNOSIS — Z471 Aftercare following joint replacement surgery: Secondary | ICD-10-CM | POA: Diagnosis not present

## 2013-10-09 DIAGNOSIS — Z96649 Presence of unspecified artificial hip joint: Secondary | ICD-10-CM

## 2013-10-09 DIAGNOSIS — M179 Osteoarthritis of knee, unspecified: Secondary | ICD-10-CM | POA: Diagnosis present

## 2013-10-09 DIAGNOSIS — N058 Unspecified nephritic syndrome with other morphologic changes: Secondary | ICD-10-CM | POA: Diagnosis present

## 2013-10-09 DIAGNOSIS — M25569 Pain in unspecified knee: Secondary | ICD-10-CM | POA: Diagnosis not present

## 2013-10-09 DIAGNOSIS — R262 Difficulty in walking, not elsewhere classified: Secondary | ICD-10-CM | POA: Diagnosis not present

## 2013-10-09 DIAGNOSIS — I25810 Atherosclerosis of coronary artery bypass graft(s) without angina pectoris: Secondary | ICD-10-CM | POA: Diagnosis not present

## 2013-10-09 DIAGNOSIS — E119 Type 2 diabetes mellitus without complications: Secondary | ICD-10-CM | POA: Diagnosis not present

## 2013-10-09 HISTORY — PX: TOTAL KNEE ARTHROPLASTY: SHX125

## 2013-10-09 LAB — GLUCOSE, CAPILLARY
GLUCOSE-CAPILLARY: 186 mg/dL — AB (ref 70–99)
Glucose-Capillary: 115 mg/dL — ABNORMAL HIGH (ref 70–99)
Glucose-Capillary: 126 mg/dL — ABNORMAL HIGH (ref 70–99)
Glucose-Capillary: 188 mg/dL — ABNORMAL HIGH (ref 70–99)
Glucose-Capillary: 162 mg/dL — ABNORMAL HIGH (ref 70–99)

## 2013-10-09 SURGERY — ARTHROPLASTY, KNEE, TOTAL
Anesthesia: General | Site: Knee | Laterality: Left

## 2013-10-09 MED ORDER — ACETAMINOPHEN 325 MG PO TABS: 650.0000 mg | ORAL_TABLET | Freq: Four times a day (QID) | ORAL | Status: DC | PRN

## 2013-10-09 MED ORDER — BUPIVACAINE-EPINEPHRINE (PF) 0.25% -1:200000 IJ SOLN
INTRAMUSCULAR | Status: AC
  Filled 2013-10-09: qty 30

## 2013-10-09 MED ORDER — EPHEDRINE SULFATE 50 MG/ML IJ SOLN
INTRAMUSCULAR | Status: AC
  Filled 2013-10-09: qty 1

## 2013-10-09 MED ORDER — ISOSORBIDE MONONITRATE 15 MG HALF TABLET
15.0000 mg | ORAL_TABLET | Freq: Every day | ORAL | Status: DC
  Administered 2013-10-10 – 2013-10-12 (×3): 15 mg via ORAL
  Filled 2013-10-09 (×3): qty 1

## 2013-10-09 MED ORDER — BISACODYL 5 MG PO TBEC
10.0000 mg | DELAYED_RELEASE_TABLET | Freq: Every day | ORAL | Status: DC
  Administered 2013-10-09 – 2013-10-11 (×3): 10 mg via ORAL
  Filled 2013-10-09 (×3): qty 2

## 2013-10-09 MED ORDER — CEFAZOLIN SODIUM 1-5 GM-% IV SOLN
1.0000 g | Freq: Four times a day (QID) | INTRAVENOUS | Status: AC
  Administered 2013-10-09 (×2): 1 g via INTRAVENOUS
  Filled 2013-10-09 (×2): qty 50

## 2013-10-09 MED ORDER — ONDANSETRON HCL 4 MG/2ML IJ SOLN
INTRAMUSCULAR | Status: AC
  Filled 2013-10-09: qty 2

## 2013-10-09 MED ORDER — SIMVASTATIN 40 MG PO TABS
40.0000 mg | ORAL_TABLET | Freq: Every evening | ORAL | Status: DC
  Filled 2013-10-09: qty 1

## 2013-10-09 MED ORDER — ONDANSETRON HCL 4 MG PO TABS
4.0000 mg | ORAL_TABLET | Freq: Four times a day (QID) | ORAL | Status: DC | PRN
Start: 2013-10-09 — End: 2013-10-12

## 2013-10-09 MED ORDER — PROPOFOL 10 MG/ML IV BOLUS
INTRAVENOUS | Status: AC
  Filled 2013-10-09: qty 20

## 2013-10-09 MED ORDER — ACETAMINOPHEN 650 MG RE SUPP: 650.0000 mg | Freq: Four times a day (QID) | RECTAL | Status: DC | PRN

## 2013-10-09 MED ORDER — HYDROMORPHONE HCL PF 1 MG/ML IJ SOLN
INTRAMUSCULAR | Status: AC
  Filled 2013-10-09: qty 1

## 2013-10-09 MED ORDER — BUPIVACAINE-EPINEPHRINE 0.25% -1:200000 IJ SOLN
INTRAMUSCULAR | Status: DC | PRN
  Administered 2013-10-09: 1 mL

## 2013-10-09 MED ORDER — ROCURONIUM BROMIDE 50 MG/5ML IV SOLN
INTRAVENOUS | Status: AC
  Filled 2013-10-09: qty 1

## 2013-10-09 MED ORDER — SUCCINYLCHOLINE CHLORIDE 20 MG/ML IJ SOLN
INTRAMUSCULAR | Status: AC
  Filled 2013-10-09: qty 1

## 2013-10-09 MED ORDER — INSULIN ASPART 100 UNIT/ML ~~LOC~~ SOLN
0.0000 [IU] | Freq: Three times a day (TID) | SUBCUTANEOUS | Status: DC
  Administered 2013-10-09 – 2013-10-11 (×6): 3 [IU] via SUBCUTANEOUS
  Administered 2013-10-11 (×2): 2 [IU] via SUBCUTANEOUS

## 2013-10-09 MED ORDER — SODIUM CHLORIDE 0.9 % IR SOLN
Status: DC | PRN
  Administered 2013-10-09: 3000 mL

## 2013-10-09 MED ORDER — METOCLOPRAMIDE HCL 10 MG PO TABS: 5.0000 mg | ORAL_TABLET | Freq: Three times a day (TID) | ORAL | Status: DC | PRN

## 2013-10-09 MED ORDER — LACTATED RINGERS IV SOLN
INTRAVENOUS | Status: DC | PRN
  Administered 2013-10-09 (×2): via INTRAVENOUS

## 2013-10-09 MED ORDER — RIVAROXABAN 10 MG PO TABS
10.0000 mg | ORAL_TABLET | Freq: Every day | ORAL | Status: DC
  Administered 2013-10-10 – 2013-10-12 (×3): 10 mg via ORAL
  Filled 2013-10-09 (×5): qty 1

## 2013-10-09 MED ORDER — CARVEDILOL 25 MG PO TABS
25.0000 mg | ORAL_TABLET | Freq: Two times a day (BID) | ORAL | Status: DC
  Administered 2013-10-09 – 2013-10-12 (×6): 25 mg via ORAL
  Filled 2013-10-09 (×8): qty 1

## 2013-10-09 MED ORDER — DEXAMETHASONE SODIUM PHOSPHATE 10 MG/ML IJ SOLN
10.0000 mg | Freq: Three times a day (TID) | INTRAMUSCULAR | Status: AC
  Administered 2013-10-09: 10 mg via INTRAVENOUS
  Filled 2013-10-09 (×3): qty 1

## 2013-10-09 MED ORDER — DIPHENHYDRAMINE HCL 12.5 MG/5ML PO ELIX: 12.5000 mg | ORAL_SOLUTION | ORAL | Status: DC | PRN

## 2013-10-09 MED ORDER — CHLORHEXIDINE GLUCONATE 4 % EX LIQD
60.0000 mL | Freq: Once | CUTANEOUS | Status: DC
  Filled 2013-10-09: qty 60

## 2013-10-09 MED ORDER — OXYCODONE HCL 5 MG PO TABS
5.0000 mg | ORAL_TABLET | ORAL | Status: DC | PRN
  Administered 2013-10-09 (×3): 10 mg via ORAL
  Administered 2013-10-10: 5 mg via ORAL
  Administered 2013-10-10 (×3): 10 mg via ORAL
  Administered 2013-10-11 (×3): 5 mg via ORAL
  Administered 2013-10-12: 10 mg via ORAL
  Filled 2013-10-09: qty 2
  Filled 2013-10-09: qty 1
  Filled 2013-10-09 (×2): qty 2
  Filled 2013-10-09: qty 1
  Filled 2013-10-09 (×4): qty 2
  Filled 2013-10-09: qty 1
  Filled 2013-10-09: qty 2

## 2013-10-09 MED ORDER — CELECOXIB 200 MG PO CAPS
200.0000 mg | ORAL_CAPSULE | Freq: Two times a day (BID) | ORAL | Status: DC
  Administered 2013-10-09 – 2013-10-12 (×7): 200 mg via ORAL
  Filled 2013-10-09 (×8): qty 1

## 2013-10-09 MED ORDER — FENTANYL CITRATE 0.05 MG/ML IJ SOLN
INTRAMUSCULAR | Status: DC | PRN
  Administered 2013-10-09: 50 ug via INTRAVENOUS
  Administered 2013-10-09 (×8): 25 ug via INTRAVENOUS

## 2013-10-09 MED ORDER — HYDROMORPHONE HCL PF 1 MG/ML IJ SOLN
1.0000 mg | INTRAMUSCULAR | Status: DC | PRN
  Administered 2013-10-09 – 2013-10-10 (×3): 1 mg via INTRAVENOUS
  Filled 2013-10-09 (×2): qty 1

## 2013-10-09 MED ORDER — POVIDONE-IODINE 7.5 % EX SOLN
Freq: Once | CUTANEOUS | Status: DC
  Filled 2013-10-09: qty 118

## 2013-10-09 MED ORDER — ALUM & MAG HYDROXIDE-SIMETH 200-200-20 MG/5ML PO SUSP: 30.0000 mL | ORAL | Status: DC | PRN

## 2013-10-09 MED ORDER — DEXAMETHASONE 6 MG PO TABS
10.0000 mg | ORAL_TABLET | Freq: Three times a day (TID) | ORAL | Status: AC
  Administered 2013-10-09 – 2013-10-10 (×2): 10 mg via ORAL
  Filled 2013-10-09 (×3): qty 1

## 2013-10-09 MED ORDER — OXYCODONE HCL 5 MG PO TABS: 5.0000 mg | ORAL_TABLET | Freq: Once | ORAL | Status: DC | PRN

## 2013-10-09 MED ORDER — INSULIN ASPART 100 UNIT/ML ~~LOC~~ SOLN: 0.0000 [IU] | Freq: Every day | SUBCUTANEOUS | Status: DC

## 2013-10-09 MED ORDER — BUPIVACAINE-EPINEPHRINE (PF) 0.5% -1:200000 IJ SOLN
INTRAMUSCULAR | Status: DC | PRN
  Administered 2013-10-09: 20 mL via PERINEURAL

## 2013-10-09 MED ORDER — HYDROMORPHONE HCL PF 1 MG/ML IJ SOLN
0.2500 mg | INTRAMUSCULAR | Status: DC | PRN
  Administered 2013-10-09 (×3): 0.25 mg via INTRAVENOUS

## 2013-10-09 MED ORDER — AMLODIPINE BESYLATE 5 MG PO TABS
5.0000 mg | ORAL_TABLET | Freq: Every day | ORAL | Status: DC
  Administered 2013-10-10 – 2013-10-12 (×3): 5 mg via ORAL
  Filled 2013-10-09 (×4): qty 1

## 2013-10-09 MED ORDER — POTASSIUM CHLORIDE IN NACL 20-0.9 MEQ/L-% IV SOLN
INTRAVENOUS | Status: DC
  Administered 2013-10-09 – 2013-10-10 (×2): via INTRAVENOUS
  Filled 2013-10-09 (×9): qty 1000

## 2013-10-09 MED ORDER — MENTHOL 3 MG MT LOZG: 1.0000 | LOZENGE | OROMUCOSAL | Status: DC | PRN

## 2013-10-09 MED ORDER — MIDAZOLAM HCL 2 MG/2ML IJ SOLN
INTRAMUSCULAR | Status: AC
  Filled 2013-10-09: qty 2

## 2013-10-09 MED ORDER — ONDANSETRON HCL 4 MG/2ML IJ SOLN: 4.0000 mg | Freq: Once | INTRAMUSCULAR | Status: DC | PRN

## 2013-10-09 MED ORDER — METOCLOPRAMIDE HCL 5 MG/ML IJ SOLN
5.0000 mg | Freq: Three times a day (TID) | INTRAMUSCULAR | Status: DC | PRN
Start: 2013-10-09 — End: 2013-10-12

## 2013-10-09 MED ORDER — CALCIUM CARBONATE-VITAMIN D 600-400 MG-UNIT PO TABS: 1.0000 | ORAL_TABLET | Freq: Every day | ORAL | Status: DC

## 2013-10-09 MED ORDER — COLCHICINE 0.6 MG PO TABS
0.6000 mg | ORAL_TABLET | Freq: Every day | ORAL | Status: DC
  Administered 2013-10-09 – 2013-10-12 (×4): 0.6 mg via ORAL
  Filled 2013-10-09 (×4): qty 1

## 2013-10-09 MED ORDER — ARTIFICIAL TEARS OP OINT
TOPICAL_OINTMENT | OPHTHALMIC | Status: AC
  Filled 2013-10-09: qty 3.5

## 2013-10-09 MED ORDER — PHENOL 1.4 % MT LIQD: 1.0000 | OROMUCOSAL | Status: DC | PRN

## 2013-10-09 MED ORDER — DEXAMETHASONE SODIUM PHOSPHATE 10 MG/ML IJ SOLN
INTRAMUSCULAR | Status: DC | PRN
  Administered 2013-10-09: 10 mg via INTRAVENOUS

## 2013-10-09 MED ORDER — LACTATED RINGERS IV SOLN: INTRAVENOUS | Status: DC

## 2013-10-09 MED ORDER — ONDANSETRON HCL 4 MG/2ML IJ SOLN
4.0000 mg | Freq: Four times a day (QID) | INTRAMUSCULAR | Status: DC | PRN
Start: 2013-10-09 — End: 2013-10-12

## 2013-10-09 MED ORDER — LOSARTAN POTASSIUM 50 MG PO TABS
100.0000 mg | ORAL_TABLET | Freq: Every day | ORAL | Status: DC
  Administered 2013-10-11 – 2013-10-12 (×2): 100 mg via ORAL
  Filled 2013-10-09 (×2): qty 2

## 2013-10-09 MED ORDER — ATORVASTATIN CALCIUM 20 MG PO TABS
20.0000 mg | ORAL_TABLET | Freq: Every day | ORAL | Status: DC
  Administered 2013-10-09 – 2013-10-11 (×3): 20 mg via ORAL
  Filled 2013-10-09 (×4): qty 1

## 2013-10-09 MED ORDER — INSULIN ASPART 100 UNIT/ML ~~LOC~~ SOLN
3.0000 [IU] | Freq: Three times a day (TID) | SUBCUTANEOUS | Status: DC
  Administered 2013-10-09 – 2013-10-12 (×9): 3 [IU] via SUBCUTANEOUS

## 2013-10-09 MED ORDER — ONDANSETRON HCL 4 MG/2ML IJ SOLN
INTRAMUSCULAR | Status: DC | PRN
  Administered 2013-10-09: 4 mg via INTRAVENOUS

## 2013-10-09 MED ORDER — OXYCODONE HCL 5 MG/5ML PO SOLN: 5.0000 mg | Freq: Once | ORAL | Status: DC | PRN

## 2013-10-09 MED ORDER — PREDNISONE 5 MG PO TABS: 5.0000 mg | ORAL_TABLET | Freq: Every day | ORAL | Status: DC

## 2013-10-09 MED ORDER — ADULT MULTIVITAMIN W/MINERALS CH
1.0000 | ORAL_TABLET | Freq: Every day | ORAL | Status: DC
Start: 2013-10-09 — End: 2013-10-12
  Administered 2013-10-10 – 2013-10-12 (×3): 1 via ORAL
  Filled 2013-10-09 (×4): qty 1

## 2013-10-09 MED ORDER — CALCIUM CARBONATE-VITAMIN D 500-200 MG-UNIT PO TABS
1.0000 | ORAL_TABLET | Freq: Every day | ORAL | Status: DC
  Administered 2013-10-10 – 2013-10-12 (×3): 1 via ORAL
  Filled 2013-10-09 (×4): qty 1

## 2013-10-09 MED ORDER — FENTANYL CITRATE 0.05 MG/ML IJ SOLN
INTRAMUSCULAR | Status: AC
  Filled 2013-10-09: qty 5

## 2013-10-09 MED ORDER — PROPOFOL 10 MG/ML IV BOLUS
INTRAVENOUS | Status: DC | PRN
  Administered 2013-10-09: 150 mg via INTRAVENOUS

## 2013-10-09 MED ORDER — SODIUM CHLORIDE 0.9 % IJ SOLN
INTRAMUSCULAR | Status: AC
  Filled 2013-10-09: qty 10

## 2013-10-09 MED ORDER — LIDOCAINE HCL (CARDIAC) 20 MG/ML IV SOLN
INTRAVENOUS | Status: DC | PRN
  Administered 2013-10-09: 80 mg via INTRAVENOUS

## 2013-10-09 MED ORDER — DOCUSATE SODIUM 100 MG PO CAPS
100.0000 mg | ORAL_CAPSULE | Freq: Two times a day (BID) | ORAL | Status: DC
  Administered 2013-10-09 – 2013-10-12 (×7): 100 mg via ORAL
  Filled 2013-10-09 (×6): qty 1

## 2013-10-09 MED ORDER — LIDOCAINE HCL (CARDIAC) 20 MG/ML IV SOLN
INTRAVENOUS | Status: AC
  Filled 2013-10-09: qty 5

## 2013-10-09 SURGICAL SUPPLY — 68 items
BANDAGE ELASTIC 6 VELCRO ST LF (GAUZE/BANDAGES/DRESSINGS) ×2 IMPLANT
BANDAGE ESMARK 6X9 LF (GAUZE/BANDAGES/DRESSINGS) ×1 IMPLANT
BLADE SAGITTAL 25.0X1.19X90 (BLADE) ×2 IMPLANT
BLADE SAW SGTL 11.0X1.19X90.0M (BLADE) IMPLANT
BLADE SAW SGTL 13.0X1.19X90.0M (BLADE) ×2 IMPLANT
BLADE SURG 10 STRL SS (BLADE) ×4 IMPLANT
BNDG CMPR 9X6 STRL LF SNTH (GAUZE/BANDAGES/DRESSINGS) ×1
BNDG CMPR MED 15X6 ELC VLCR LF (GAUZE/BANDAGES/DRESSINGS) ×1
BNDG ELASTIC 6X15 VLCR STRL LF (GAUZE/BANDAGES/DRESSINGS) ×2 IMPLANT
BNDG ESMARK 6X9 LF (GAUZE/BANDAGES/DRESSINGS) ×2
BOWL SMART MIX CTS (DISPOSABLE) ×2 IMPLANT
CAPT RP KNEE ×2 IMPLANT
CEMENT HV SMART SET (Cement) ×4 IMPLANT
COVER SURGICAL LIGHT HANDLE (MISCELLANEOUS) ×2 IMPLANT
CUFF TOURNIQUET SINGLE 34IN LL (TOURNIQUET CUFF) ×2 IMPLANT
CUFF TOURNIQUET SINGLE 44IN (TOURNIQUET CUFF) IMPLANT
DRAPE EXTREMITY T 121X128X90 (DRAPE) ×2 IMPLANT
DRAPE INCISE IOBAN 66X45 STRL (DRAPES) ×2 IMPLANT
DRAPE PROXIMA HALF (DRAPES) ×2 IMPLANT
DRAPE U-SHAPE 47X51 STRL (DRAPES) ×2 IMPLANT
DRSG ADAPTIC 3X8 NADH LF (GAUZE/BANDAGES/DRESSINGS) ×2 IMPLANT
DRSG PAD ABDOMINAL 8X10 ST (GAUZE/BANDAGES/DRESSINGS) ×2 IMPLANT
DURAPREP 26ML APPLICATOR (WOUND CARE) ×4 IMPLANT
ELECT CAUTERY BLADE 6.4 (BLADE) ×2 IMPLANT
ELECT REM PT RETURN 9FT ADLT (ELECTROSURGICAL) ×2
ELECTRODE REM PT RTRN 9FT ADLT (ELECTROSURGICAL) ×1 IMPLANT
EVACUATOR 1/8 PVC DRAIN (DRAIN) ×2 IMPLANT
FACESHIELD WRAPAROUND (MASK) ×2 IMPLANT
GLOVE BIO SURGEON STRL SZ7 (GLOVE) ×2 IMPLANT
GLOVE BIOGEL PI IND STRL 7.0 (GLOVE) ×1 IMPLANT
GLOVE BIOGEL PI IND STRL 7.5 (GLOVE) ×1 IMPLANT
GLOVE BIOGEL PI INDICATOR 7.0 (GLOVE) ×1
GLOVE BIOGEL PI INDICATOR 7.5 (GLOVE) ×1
GLOVE SS BIOGEL STRL SZ 7.5 (GLOVE) ×1 IMPLANT
GLOVE SUPERSENSE BIOGEL SZ 7.5 (GLOVE) ×1
GOWN STRL REUS W/ TWL LRG LVL3 (GOWN DISPOSABLE) ×2 IMPLANT
GOWN STRL REUS W/ TWL XL LVL3 (GOWN DISPOSABLE) ×2 IMPLANT
GOWN STRL REUS W/TWL LRG LVL3 (GOWN DISPOSABLE) ×4
GOWN STRL REUS W/TWL XL LVL3 (GOWN DISPOSABLE) ×4
HANDPIECE INTERPULSE COAX TIP (DISPOSABLE) ×2
HOOD PEEL AWAY FACE SHEILD DIS (HOOD) ×4 IMPLANT
IMMOBILIZER KNEE 22 UNIV (SOFTGOODS) IMPLANT
KIT BASIN OR (CUSTOM PROCEDURE TRAY) ×2 IMPLANT
KIT ROOM TURNOVER OR (KITS) ×2 IMPLANT
MANIFOLD NEPTUNE II (INSTRUMENTS) ×2 IMPLANT
NS IRRIG 1000ML POUR BTL (IV SOLUTION) ×2 IMPLANT
PACK TOTAL JOINT (CUSTOM PROCEDURE TRAY) ×2 IMPLANT
PAD ARMBOARD 7.5X6 YLW CONV (MISCELLANEOUS) ×4 IMPLANT
PAD CAST 4YDX4 CTTN HI CHSV (CAST SUPPLIES) ×1 IMPLANT
PADDING CAST COTTON 4X4 STRL (CAST SUPPLIES) ×2
PADDING CAST COTTON 6X4 STRL (CAST SUPPLIES) ×2 IMPLANT
RUBBERBAND STERILE (MISCELLANEOUS) ×2 IMPLANT
SET HNDPC FAN SPRY TIP SCT (DISPOSABLE) ×1 IMPLANT
SPONGE GAUZE 4X4 12PLY (GAUZE/BANDAGES/DRESSINGS) ×2 IMPLANT
SPONGE GAUZE 4X4 12PLY STER LF (GAUZE/BANDAGES/DRESSINGS) ×2 IMPLANT
STRIP CLOSURE SKIN 1/2X4 (GAUZE/BANDAGES/DRESSINGS) ×2 IMPLANT
SUCTION FRAZIER TIP 10 FR DISP (SUCTIONS) ×2 IMPLANT
SUT ETHIBOND NAB CT1 #1 30IN (SUTURE) ×4 IMPLANT
SUT MNCRL AB 3-0 PS2 18 (SUTURE) ×2 IMPLANT
SUT VIC AB 0 CT1 27 (SUTURE) ×4
SUT VIC AB 0 CT1 27XBRD ANBCTR (SUTURE) ×2 IMPLANT
SUT VIC AB 2-0 CT1 27 (SUTURE) ×4
SUT VIC AB 2-0 CT1 TAPERPNT 27 (SUTURE) ×2 IMPLANT
SYR 30ML SLIP (SYRINGE) ×2 IMPLANT
TOWEL OR 17X24 6PK STRL BLUE (TOWEL DISPOSABLE) ×2 IMPLANT
TOWEL OR 17X26 10 PK STRL BLUE (TOWEL DISPOSABLE) ×2 IMPLANT
TRAY FOLEY CATH 16FR SILVER (SET/KITS/TRAYS/PACK) ×2 IMPLANT
WATER STERILE IRR 1000ML POUR (IV SOLUTION) ×4 IMPLANT

## 2013-10-09 NOTE — Anesthesia Postprocedure Evaluation (Signed)
  Anesthesia Post-op Note  Patient: Sheila Morgan  Procedure(s) Performed: Procedure(s): TOTAL KNEE ARTHROPLASTY (Left)  Patient Location: PACU  Anesthesia Type:GA combined with regional for post-op pain  Level of Consciousness: awake, alert  and oriented  Airway and Oxygen Therapy: Patient Spontanous Breathing and Patient connected to nasal cannula oxygen  Post-op Pain: mild  Post-op Assessment: Post-op Vital signs reviewed  Post-op Vital Signs: Reviewed  Last Vitals:  Filed Vitals:   10/09/13 1015  BP: 155/82  Pulse: 72  Temp:   Resp: 12    Complications: No apparent anesthesia complications

## 2013-10-09 NOTE — Plan of Care (Signed)
Problem: Consults Goal: Diagnosis- Total Joint Replacement Primary Total Knee Left     

## 2013-10-09 NOTE — Evaluation (Signed)
Agree with SPT.    Antoria Lanza, PT [phone]  

## 2013-10-09 NOTE — Progress Notes (Signed)
CSW was informed that patient will need SNF placement at [name] or [facility]. CSW will complete a comprehensive psychosocial in the AM. FL2 is on the shadow chart  Rhea Pink, MSW, [place]

## 2013-10-09 NOTE — Progress Notes (Signed)
Orthopedic Tech Progress Note Patient Details:  Sheila Morgan August 28, 1932 [id number]  CPM Left Knee CPM Left Knee: On Left Knee Flexion (Degrees): 60 Left Knee Extension (Degrees): 0 Additional Comments: Trapeze bar and foot roll   Theodoro Parma Cammer 10/09/2013, 10:44 AM

## 2013-10-09 NOTE — Interval H&P Note (Signed)
History and Physical Interval Note:  10/09/2013 7:00 AM  Sheila Morgan  has presented today for surgery, with the diagnosis of OA LEFT KNEE  The various methods of treatment have been discussed with the patient and family. After consideration of risks, benefits and other options for treatment, the patient has consented to  Procedure(s): TOTAL KNEE ARTHROPLASTY (Left) as a surgical intervention .  The patient's history has been reviewed, patient examined, no change in status, stable for surgery.  I have reviewed the patient's chart and labs.  Questions were answered to the patient's satisfaction.     Lorn Junes

## 2013-10-09 NOTE — Progress Notes (Signed)
OT Cancellation Note  Patient Details Name: KARL KNARR MRN: [id number] DOB: March 16, 1933   Cancelled Treatment:    Reason Eval/Treat Not Completed: OT screened.  Pt is Medicare and current D/C plan is SNF. No apparent immediate acute care OT needs, therefore will defer OT to SNF. If OT eval is needed please call Acute Rehab Dept. at [phone] or text page OT at [phone].     Benito Mccreedy OTR/L [phone] 10/09/2013, 3:49 PM

## 2013-10-09 NOTE — Care Management Note (Signed)
CARE MANAGEMENT NOTE 10/09/2013  Patient:  Sheila Morgan, Sheila Morgan   Account Number:  [id number]  Date Initiated:  10/09/2013  Documentation initiated by:  Ricki Miller  Subjective/Objective Assessment:   80 yr old female s/p left total knee arthroplasty.     Action/Plan:   Patient is for shortterm rehab at [facility]. wants either [place] or [facility] Social worker notified.   Anticipated DC Date:  10/12/2013   Anticipated DC Plan:  SKILLED NURSING FACILITY  In-house referral  Clinical Social Worker      DC Planning Services  CM consult      [facility] Choice  NA   Choice offered to / List presented to:          [facility] arranged  NA      Status of service:  In process, will continue to follow Medicare Important Message given?   (If response is "NO", the following Medicare IM given date fields will be blank) Date Medicare IM given:   Date Additional Medicare IM given:    Discharge Disposition:    Per UR Regulation:    If discussed at Long Length of Stay Meetings, dates discussed:    Comments:

## 2013-10-09 NOTE — Evaluation (Addendum)
Physical Therapy Evaluation Patient Details Name: Sheila Morgan MRN: [id number] DOB: 11-14-1932 Today's Date: 10/09/2013   History of Present Illness  80 y.o. Female s/p L TKA  Clinical Impression  Pt transferred supine to sitting EOB with min-A to help lift L LE over bed.  Pt slow in powering up to stand from EOB, min-guard provided for safety with VC for hand placement.  Pt ambulated 20 ft with min-A to manage lines and guard for safety with balance.  Pt plans to d/c to SNF for rehab before returning home with husband and temporary live-in assistance from her daughter.  Will continue to follow.    Follow Up Recommendations SNF;Supervision for mobility/OOB    Equipment Recommendations  None recommended by PT    Recommendations for Other Services       Precautions / Restrictions Precautions Precautions: Fall Precaution Comments: Discussed safety precautions due to increased risk of falls. Required Braces or Orthoses: Knee Immobilizer - Left Knee Immobilizer - Left: On when out of bed or walking;Discontinue once straight leg raise with < 10 degree lag Restrictions Weight Bearing Restrictions: Yes LLE Weight Bearing: Weight bearing as tolerated      Mobility  Bed Mobility Overal bed mobility: Needs Assistance Bed Mobility: Supine to Sit     Supine to sit: Min assist     General bed mobility comments: Min-A to help move L LE over EOB and for VC for sequencing.  Transfers Overall transfer level: Needs assistance Equipment used: Rolling walker (2 wheeled) Transfers: Sit to/from Stand Sit to Stand: Min guard         General transfer comment: Min-guard for safety with balance and VC for hand placement.  Pt very slow with powering up to standing due to hesitation about putting weight through LLE.  Ambulation/Gait Ambulation/Gait assistance: Min assist Ambulation Distance (Feet): 20 Feet Assistive device: Rolling walker (2 wheeled) Gait Pattern/deviations: Step-to  pattern;Decreased stance time - right;Decreased step length - left;Decreased stride length;Trunk flexed Gait velocity: decreased Gait velocity interpretation: Below normal speed for age/gender General Gait Details: Min-A provided to manage IV line and catheter tube and for safety with decreased balance.  Stairs            Wheelchair Mobility    Modified Rankin (Stroke Patients Only)       Balance Overall balance assessment: Needs assistance Sitting-balance support: Single extremity supported;Feet supported Sitting balance-Leahy Scale: Poor Sitting balance - Comments: Pt maintained single UE support during sitting at EOB for balance.   Standing balance support: Bilateral upper extremity supported;During functional activity Standing balance-Leahy Scale: Poor Standing balance comment: Pt requires heavy bil. UE support on RW.                             Pertinent Vitals/Pain Pt reports that she is sore, but the pain is manageable.  Pt premedicated.    Home Living Family/patient expects to be discharged to:: Skilled nursing facility Living Arrangements: Spouse/significant other;Children               Additional Comments: Pt's daughter plans to live with her for as long as needed after pt returns home from SNF due to pt's husband's inability to assist.    Prior Function Level of Independence: Independent with assistive device(s)         Comments: Pt used SPC for ambulation and has a RW from previous surgeries.     Hand Dominance  Extremity/Trunk Assessment   Upper Extremity Assessment: Defer to OT evaluation           Lower Extremity Assessment: LLE deficits/detail   LLE Deficits / Details: decreased ROM and strength, increased pain     Communication   Communication: No difficulties  Cognition Arousal/Alertness: Awake/alert Behavior During Therapy: WFL for tasks assessed/performed Overall Cognitive Status: Within Functional  Limits for tasks assessed                      General Comments      Exercises Total Joint Exercises Ankle Circles/Pumps: AROM;Both;10 reps;Seated Quad Sets: AROM;Left;10 reps;Seated      Assessment/Plan    PT Assessment Patient needs continued PT services  PT Diagnosis Abnormality of gait;Acute pain   PT Problem List Decreased strength;Decreased range of motion;Decreased activity tolerance;Decreased balance;Decreased mobility;Decreased knowledge of use of DME;Decreased knowledge of precautions;Pain  PT Treatment Interventions DME instruction;Gait training;Functional mobility training;Therapeutic activities;Therapeutic exercise;Balance training;Patient/family education   PT Goals (Current goals can be found in the Care Plan section) Acute Rehab PT Goals Patient Stated Goal: Get better so she can get the other knee done PT Goal Formulation: With patient Time For Goal Achievement: 10/20/13 Potential to Achieve Goals: Good    Frequency Min 3X/week   Barriers to discharge        Co-evaluation               End of Session Equipment Utilized During Treatment: Gait belt Activity Tolerance: Patient tolerated treatment well Patient left: in chair;with call bell/phone within reach Nurse Communication: Mobility status;Other (comment) (IV beeping)         Time: 1430-1506 PT Time Calculation (min): 36 min   Charges:   PT Evaluation $Initial PT Evaluation Tier I: 1 Procedure PT Treatments $Gait Training: 8-22 mins $Therapeutic Exercise: 8-22 mins   PT G Codes:          Mykeisha Dysert, SPT 10/09/2013, 3:46 PM

## 2013-10-09 NOTE — Plan of Care (Signed)
Problem: Consults Goal: Diagnosis- Total Joint Replacement Outcome: Completed/Met Date Met:  10/09/13 Primary Total Knee     

## 2013-10-09 NOTE — Transfer of Care (Signed)
Immediate Anesthesia Transfer of Care Note  Patient: Sheila Morgan  Procedure(s) Performed: Procedure(s): TOTAL KNEE ARTHROPLASTY (Left)  Patient Location: PACU  Anesthesia Type:General  Level of Consciousness: awake and alert   Airway & Oxygen Therapy: Patient Spontanous Breathing and Patient connected to nasal cannula oxygen  Post-op Assessment: Report given to PACU RN, Post -op Vital signs reviewed and stable and Patient moving all extremities X 4  Post vital signs: Reviewed and stable  Complications: none

## 2013-10-09 NOTE — H&P (View-Only) (Signed)
TOTAL KNEE ADMISSION H&P  Patient is being admitted for left total knee arthroplasty.  Subjective:  Chief Complaint:left knee pain.  HPI: Sheila Morgan, 80 y.o. female, has a history of pain and functional disability in the left knee due to arthritis and has failed non-surgical conservative treatments for greater than 12 weeks to includeNSAID's and/or analgesics, corticosteriod injections, viscosupplementation injections, flexibility and strengthening excercises, supervised PT with diminished ADL's post treatment, use of assistive devices, weight reduction as appropriate and activity modification.  Onset of symptoms was gradual, starting 10 years ago with gradually worsening course since that time. The patient noted no past surgery on the left knee(s).  Patient currently rates pain in the left knee(s) at 10 out of 10 with activity. Patient has night pain, worsening of pain with activity and weight bearing, pain that interferes with activities of daily living, crepitus and joint swelling.  Patient has evidence of subchondral sclerosis, periarticular osteophytes and joint space narrowing by imaging studies.There is no active infection.  Patient Active Problem List   Diagnosis Date Noted  . Hyperlipidemia   . Hypertension   . Cancer   . Arthritis   . Hiatal hernia   . Osteoporosis   . PONV (postoperative nausea and vomiting)   . Coronary artery disease   . Left knee DJD   . Benign paroxysmal positional vertigo 05/19/2013  . Weakness generalized 05/09/2013  . Influenza 05/09/2013  . Pseudogout of left knee 03/29/2013  . Acute right lumbar radiculopathy 03/18/2013  . Type II or unspecified type diabetes mellitus with renal manifestations, not stated as uncontrolled 03/18/2013  . Shoulder impingement syndrome 01/25/2013  . Vertigo 10/17/2012  . Renal insufficiency, mild 06/20/2011  . HYPERTENSION 12/31/2009  . OSTEOPOROSIS 12/31/2009  . SKIN CANCER, HX OF 12/31/2009  . Proteinuria  01/05/2008  . UNSPECIFIED ANEMIA 12/21/2007  . BLOOD IN STOOL, OCCULT 12/21/2007  . HYPERLIPIDEMIA 09/08/2006  . Polymyalgia rheumatica 09/08/2006  . ALKALINE PHOSPHATASE, ELEVATED 09/08/2006  . CORONARY ARTERY BYPASS GRAFT, HX OF 09/08/2006   Past Medical History  Diagnosis Date  . Hyperlipidemia   . Hypertension   . Cancer     skin  . Diabetes mellitus     2  . Arthritis   . Hiatal hernia   . Hemorrhoids   . Osteoporosis   . PONV (postoperative nausea and vomiting)   . Coronary artery disease     status post post LAD angioplasty in 1993  and subsequent coronary artery bypass grafting x1 with a LIMA to the LAD July of 1994. She had stenting of her RCA with a bare-metal stent November 2002.  Marland Kitchen Left knee DJD     Past Surgical History  Procedure Laterality Date  . Coronary artery bypass graft    . Coronary angioplasty with stent placement    . Total hip arthroplasty       (Not in a hospital admission) No Known Allergies   Current Outpatient Prescriptions on File Prior to Visit  Medication Sig Dispense Refill  . alendronate (FOSAMAX) 70 MG tablet Take 70 mg by mouth every Saturday. Take with a full glass of water on an empty stomach. saturday      . amLODipine (NORVASC) 5 MG tablet Take 5 mg by mouth daily.      Marland Kitchen aspirin 81 MG tablet Take 81 mg by mouth daily.        . Calcium Carbonate-Vitamin D (CALCIUM 600-D PO) Take 1 tablet by mouth 2 (two) times daily.       Marland Kitchen  carvedilol (COREG) 25 MG tablet Take 1 tablet (25 mg total) by mouth 2 (two) times daily with a meal.  60 tablet  3  . colchicine (COLCRYS) 0.6 MG tablet Take 0.6 mg by mouth daily.      Marland Kitchen glucose blood (ONE TOUCH ULTRA TEST) test strip Use as instructed  100 each  4  . isosorbide mononitrate (IMDUR) 30 MG 24 hr tablet Take 15 mg by mouth daily.      Marland Kitchen losartan (COZAAR) 100 MG tablet Take 1 tablet (100 mg total) by mouth daily.  90 tablet  3  . metFORMIN (GLUCOPHAGE) 500 MG tablet Take 500 mg by mouth daily  with breakfast.      . Multiple Vitamin (MULITIVITAMIN WITH MINERALS) TABS Take 1 tablet by mouth daily.      . predniSONE (DELTASONE) 5 MG tablet Take 5 mg by mouth daily.      . simvastatin (ZOCOR) 40 MG tablet Take 40 mg by mouth every evening.      . Lancets (ONETOUCH ULTRASOFT) lancets Use as instructed  100 each  4  . vitamin B-12 (CYANOCOBALAMIN) 1000 MCG tablet Take 1,000 mcg by mouth daily.         No current facility-administered medications on file prior to visit.    History  Substance Use Topics  . Smoking status: Never Smoker   . Smokeless tobacco: Never Used  . Alcohol Use: No    Family History  Problem Relation Age of Onset  . Heart disease Father   . Cancer Sister     breast  . Diabetes Sister   . Heart disease Brother   . Heart disease Brother   . Heart disease Brother   . Diabetes Sister      Review of Systems  Constitutional: Negative.   Eyes: Negative.   Respiratory: Negative.   Cardiovascular: Negative.   Gastrointestinal: Negative.   Genitourinary: Negative.   Musculoskeletal: Positive for joint pain.       Bilateral knee pain left worse than right  Skin: Negative.   Neurological: Negative.   Endo/Heme/Allergies: Negative.   Psychiatric/Behavioral: Negative.     Objective:  Physical Exam  Constitutional: She is oriented to person, place, and time. She appears well-developed and well-nourished.  HENT:  Head: Normocephalic and atraumatic.  Eyes: Conjunctivae are normal. Pupils are equal, round, and reactive to light.  Neck: Neck supple.  Cardiovascular: Normal rate and regular rhythm.   Respiratory: Effort normal and breath sounds normal.  GI: Soft. Bowel sounds are normal.  Genitourinary:  Not pertinent to current symptomatology therefore not examined.  Musculoskeletal:  Examination of both knees reveals pain  bilaterally, she has moderate swelling in the right knee more than the left knee but the left knee bothers her overall more. She  has range of motion -5 to 125 degrees, knees are stable with normal patella tracking and diffuse pain. Vascular exam: pulses 2+ and symmetric.  Neurological: She is alert and oriented to person, place, and time.  Skin: Skin is warm and dry.  Psychiatric: She has a normal mood and affect.    Vital signs in last 24 hours: Last recorded: 05/27 1300   BP: 155/81 Pulse: 74  Temp: 98 F (36.7 C)    Height: 5\' 5"  (1.651 m) SpO2: 95  Weight: 74.39 kg (164 lb)     Labs:   Estimated body mass index is 27.29 kg/(m^2) as calculated from the following:   Height as of this encounter: 5\' 5"  (  1.651 m).   Weight as of this encounter: 74.39 kg (164 lb).   Imaging Review Plain radiographs demonstrate severe degenerative joint disease of the left knee(s). The overall alignment issignificant valgus. The bone quality appears to be fair for age and reported activity level.  Assessment/Plan:  End stage arthritis, left knee  Patient Active Problem List   Diagnosis Date Noted  . Hyperlipidemia   . Hypertension   . Cancer   . Arthritis   . Hiatal hernia   . Osteoporosis   . PONV (postoperative nausea and vomiting)   . Coronary artery disease   . Left knee DJD   . Benign paroxysmal positional vertigo 05/19/2013  . Weakness generalized 05/09/2013  . Influenza 05/09/2013  . Pseudogout of left knee 03/29/2013  . Acute right lumbar radiculopathy 03/18/2013  . Type II or unspecified type diabetes mellitus with renal manifestations, not stated as uncontrolled 03/18/2013  . Shoulder impingement syndrome 01/25/2013  . Vertigo 10/17/2012  . Renal insufficiency, mild 06/20/2011  . HYPERTENSION 12/31/2009  . OSTEOPOROSIS 12/31/2009  . SKIN CANCER, HX OF 12/31/2009  . Proteinuria 01/05/2008  . UNSPECIFIED ANEMIA 12/21/2007  . BLOOD IN STOOL, OCCULT 12/21/2007  . HYPERLIPIDEMIA 09/08/2006  . Polymyalgia rheumatica 09/08/2006  . ALKALINE PHOSPHATASE, ELEVATED 09/08/2006  . CORONARY ARTERY BYPASS  GRAFT, HX OF 09/08/2006     The patient history, physical examination, clinical judgment of the provider and imaging studies are consistent with end stage degenerative joint disease of the left knee(s) and total knee arthroplasty is deemed medically necessary. The treatment options including medical management, injection therapy arthroscopy and arthroplasty were discussed at length. The risks and benefits of total knee arthroplasty were presented and reviewed. The risks due to aseptic loosening, infection, stiffness, patella tracking problems, thromboembolic complications and other imponderables were discussed. The patient acknowledged the explanation, agreed to proceed with the plan and consent was signed. Patient is being admitted for inpatient treatment for surgery, pain control, PT, OT, prophylactic antibiotics, VTE prophylaxis, progressive ambulation and ADL's and discharge planning. The patient is planning to be discharged to skilled nursing facility [facility] or [place]  Georgana Romain A. Kaleen Mask Physician Assistant [facility] [phone]  09/27/2013, 2:50 PM

## 2013-10-09 NOTE — Evaluation (Signed)
Agree with SPT.    Claudia Greenley, PT [phone]  

## 2013-10-09 NOTE — Anesthesia Preprocedure Evaluation (Addendum)
Anesthesia Evaluation  Patient identified by MRN, date of birth, ID band Patient awake    Reviewed: Allergy & Precautions, H&P , NPO status , Patient's Chart, lab work & pertinent test results, reviewed documented beta blocker date and time   History of Anesthesia Complications (+) PONV  Airway Mallampati: I TM Distance: >3 FB Neck ROM: Full    Dental  (+) Dental Advisory Given, Teeth Intact, Missing, Poor Dentition   Pulmonary  breath sounds clear to auscultation        Cardiovascular hypertension, Pt. on home beta blockers and Pt. on medications + CAD and + CABG Rhythm:Regular Rate:Normal     Neuro/Psych    GI/Hepatic hiatal hernia,   Endo/Other  diabetes, Type 2, Insulin Dependent, Oral Hypoglycemic Agents  Renal/GU Renal InsufficiencyRenal disease     Musculoskeletal   Abdominal   Peds  Hematology   Anesthesia Other Findings   Reproductive/Obstetrics                          Anesthesia Physical Anesthesia Plan  ASA: III  Anesthesia Plan: General   Post-op Pain Management:    Induction: Intravenous  Airway Management Planned: LMA  Additional Equipment:   Intra-op Plan:   Post-operative Plan: Extubation in OR  Informed Consent: I have reviewed the patients History and Physical, chart, labs and discussed the procedure including the risks, benefits and alternatives for the proposed anesthesia with the patient or authorized representative who has indicated his/her understanding and acceptance.   Dental advisory given  Plan Discussed with: CRNA, Anesthesiologist and Surgeon  Anesthesia Plan Comments:         Anesthesia Quick Evaluation

## 2013-10-09 NOTE — Progress Notes (Signed)
Utilization review completed.  

## 2013-10-09 NOTE — Op Note (Signed)
MRN:     [id number] DOB/AGE:    Jun 13, 1932 / 80 y.o.       OPERATIVE REPORT    DATE OF PROCEDURE:  10/09/2013       PREOPERATIVE DIAGNOSIS:   OA LEFT KNEE      Estimated body mass index is 27.29 kg/(m^2) as calculated from the following:   Height as of 09/28/13: 5\' 5"  (1.651 m).   Weight as of this encounter: 74.39 kg (164 lb).                                                        POSTOPERATIVE DIAGNOSIS:   OA LEFT KNEE                                                                      PROCEDURE:  Procedure(s): TOTAL KNEE ARTHROPLASTY Using Depuy Sigma RP implants #2.5 Femur, #3Tibia, 40mm sigma RP bearing, 32 Patella     SURGEON: Lorn Junes    ASSISTANT:  Kirstin Shepperson PA-C   (Present and scrubbed throughout the case, critical for assistance with exposure, retraction, instrumentation, and closure.)         ANESTHESIA: GET with Femoral Nerve Block  DRAINS: foley, 2 medium hemovac in knee   TOURNIQUET TIME: 09XIP   COMPLICATIONS:  None     SPECIMENS: None   INDICATIONS FOR PROCEDURE: The patient has  OA LEFT KNEE, varus deformities, XR shows bone on bone arthritis. Patient has failed all conservative measures including anti-inflammatory medicines, narcotics, attempts at  exercise and weight loss, cortisone injections and viscosupplementation.  Risks and benefits of surgery have been discussed, questions answered.   DESCRIPTION OF PROCEDURE: The patient identified by armband, received  right femoral nerve block and IV antibiotics, in the holding area at [facility]. Patient taken to the operating room, appropriate anesthetic  monitors were attached General endotracheal anesthesia induced with  the patient in supine position, Foley catheter was inserted. Tourniquet  applied high to the operative thigh. Lateral post and foot positioner  applied to the table, the lower extremity was then prepped and draped  in usual sterile fashion from the ankle to the tourniquet.  Time-out procedure was performed. The limb was wrapped with an Esmarch bandage and the tourniquet inflated to 365 mmHg. We began the operation by making the anterior midline incision starting at handbreadth above the patella going over the patella 1 cm medial to and  4 cm distal to the tibial tubercle. Small bleeders in the skin and the  subcutaneous tissue identified and cauterized. Transverse retinaculum was incised and reflected medially and a medial parapatellar arthrotomy was accomplished. the patella was everted and theprepatellar fat pad resected. The superficial medial collateral  ligament was then elevated from anterior to posterior along the proximal  flare of the tibia and anterior half of the menisci resected. The knee was hyperflexed exposing bone on bone arthritis. Peripheral and notch osteophytes as well as the cruciate ligaments were then resected. We continued to  work our way around posteriorly along the proximal tibia, and externally  rotated the tibia subluxing it out from underneath the femur. A McHale  retractor was placed through the notch and a lateral Hohmann retractor  placed, and we then drilled through the proximal tibia in line with the  axis of the tibia followed by an intramedullary guide rod and 2-degree  posterior slope cutting guide. The tibial cutting guide was pinned into place  allowing resection of 6 mm of bone medially and about 4 mm of bone  laterally because of her valgus deformity. Satisfied with the tibial resection, we then  entered the distal femur 2 mm anterior to the PCL origin with the  intramedullary guide rod and applied the distal femoral cutting guide  set at 81mm, with 5 degrees of valgus. This was pinned along the  epicondylar axis. At this point, the distal femoral cut was accomplished without difficulty. We then sized for a #2.5 femoral component and pinned the guide in 3 degrees of external rotation.The chamfer cutting guide was pinned into  place. The anterior, posterior, and chamfer cuts were accomplished without difficulty followed by  the Sigma RP box cutting guide and the box cut. We also removed posterior osteophytes from the posterior femoral condyles. At this  time, the knee was brought into full extension. We checked our  extension and flexion gaps and found them symmetric at 25mm.  The patella thickness measured at 21 mm. We set the cutting guide at 12 and removed the posterior 9.5-10 mm  of the patella sized for 32 button and drilled the lollipop. The knee  was then once again hyperflexed exposing the proximal tibia. We sized for a #3 tibial base plate, applied the smokestack and the conical reamer followed by the the Delta fin keel punch. We then hammered into place the Sigma RP trial femoral component, inserted a 10-mm trial bearing, trial patellar button, and took the knee through range of motion from 0-130 degrees. No thumb pressure was required for patellar  tracking. At this point, all trial components were removed, a double batch of DePuy HV cement  was mixed and applied to all bony metallic mating surfaces except for the posterior condyles of the femur itself. In order, we  hammered into place the tibial tray and removed excess cement, the femoral component and removed excess cement, a 10-mm Sigma RP bearing  was inserted, and the knee brought to full extension with compression.  The patellar button was clamped into place, and excess cement  removed. While the cement cured the wound was irrigated out with normal saline solution pulse lavage, and medium Hemovac drains were placed.. Ligament stability and patellar tracking were checked and found to be excellent. The tourniquet was then released and hemostasis was obtained with cautery. The parapatellar arthrotomy was closed with  #1 ethibond suture. The subcutaneous tissue with 0 and 2-0 undyed  Vicryl suture, and 4-0 Monocryl.. A dressing of Xeroform,  4 x 4, dressing  sponges, Webril, and Ace wrap applied. Needle and sponge count were correct times 2.The patient awakened, extubated, and taken to recovery room without difficulty. Vascular status was normal, pulses 2+ and symmetric.   Lorn Junes 10/09/2013, 8:44 AM

## 2013-10-09 NOTE — Anesthesia Procedure Notes (Addendum)
Procedure Name: LMA Insertion Date/Time: 10/09/2013 7:19 AM Performed by: Carola Frost Pre-anesthesia Checklist: Patient identified, Timeout performed, Emergency Drugs available, Suction available and Patient being monitored Patient Re-evaluated:Patient Re-evaluated prior to inductionOxygen Delivery Method: Circle system utilized Preoxygenation: Pre-oxygenation with 100% oxygen Intubation Type: IV induction Ventilation: Mask ventilation without difficulty LMA: LMA inserted LMA Size: 4.0 Number of attempts: 1 Placement Confirmation: CO2 detector,  positive ETCO2 and breath sounds checked- equal and bilateral Tube secured with: Tape Dental Injury: Teeth and Oropharynx as per pre-operative assessment    Anesthesia Regional Block:  Adductor canal block  Pre-Anesthetic Checklist: ,, timeout performed, Correct Patient, Correct Site, Correct Laterality, Correct Procedure, Correct Position, site marked, Risks and benefits discussed,  Surgical consent,  Pre-op evaluation,  At surgeon's request and post-op pain management  Laterality: Left and Lower  Prep: chloraprep       Needles:  Injection technique: Single-shot  Needle Type: Echogenic Needle     Needle Length: 9cm 9 cm Needle Gauge: 21 and 21 G    Additional Needles:  Procedures: ultrasound guided (picture in chart) Adductor canal block Narrative:  Start time: 10/09/2013 7:00 AM End time: 10/09/2013 7:10 AM Injection made incrementally with aspirations every 5 mL.  Performed by: Personally  Anesthesiologist: Lorrene Reid, MD

## 2013-10-10 ENCOUNTER — Encounter (HOSPITAL_COMMUNITY): Payer: Self-pay | Admitting: Orthopedic Surgery

## 2013-10-10 LAB — GLUCOSE, CAPILLARY
GLUCOSE-CAPILLARY: 171 mg/dL — AB (ref 70–99)
Glucose-Capillary: 158 mg/dL — ABNORMAL HIGH (ref 70–99)
Glucose-Capillary: 163 mg/dL — ABNORMAL HIGH (ref 70–99)
Glucose-Capillary: 159 mg/dL — ABNORMAL HIGH (ref 70–99)

## 2013-10-10 LAB — CBC
HEMATOCRIT: 32.4 % — AB (ref 36.0–46.0)
Hemoglobin: 10.9 g/dL — ABNORMAL LOW (ref 12.0–15.0)
MCH: 30.7 pg (ref 26.0–34.0)
MCHC: 33.6 g/dL (ref 30.0–36.0)
MCV: 91.3 fL (ref 78.0–100.0)
PLATELETS: 153 10*3/uL (ref 150–400)
RBC: 3.55 MIL/uL — AB (ref 3.87–5.11)
RDW: 14.5 % (ref 11.5–15.5)
WBC: 18.4 10*3/uL — AB (ref 4.0–10.5)

## 2013-10-10 LAB — BASIC METABOLIC PANEL
BUN: 19 mg/dL (ref 6–23)
CHLORIDE: 100 meq/L (ref 96–112)
CO2: 21 meq/L (ref 19–32)
CREATININE: 0.99 mg/dL (ref 0.50–1.10)
Calcium: 8.9 mg/dL (ref 8.4–10.5)
GFR calc Af Amer: 61 mL/min — ABNORMAL LOW (ref >90)
GFR calc non Af Amer: 53 mL/min — ABNORMAL LOW (ref >90)
Glucose, Bld: 146 mg/dL — ABNORMAL HIGH (ref 70–99)
Potassium: 4.7 mEq/L (ref 3.7–5.3)
Sodium: 138 mEq/L (ref 137–147)

## 2013-10-10 NOTE — Discharge Instructions (Signed)
Information on my medicine - XARELTO (Rivaroxaban)  This medication education was reviewed with me or my healthcare representative as part of my discharge preparation.  The pharmacist that spoke with me during my hospital stay was:  Saundra Shelling, [facility]  Why was Xarelto prescribed for you? Xarelto was prescribed for you to reduce the risk of blood clots forming after orthopedic surgery. The medical term for these abnormal blood clots is venous thromboembolism (VTE).  What do you need to know about xarelto ? Take your Xarelto ONCE DAILY at the same time every day. You may take it either with or without food.  If you have difficulty swallowing the tablet whole, you may crush it and mix in applesauce just prior to taking your dose.  Take Xarelto exactly as prescribed by your doctor and DO NOT stop taking Xarelto without talking to the doctor who prescribed the medication.  Stopping without other VTE prevention medication to take the place of Xarelto may increase your risk of developing a clot.  After discharge, you should have regular check-up appointments with your healthcare provider that is prescribing your Xarelto.    What do you do if you miss a dose? If you miss a dose, take it as soon as you remember on the same day then continue your regularly scheduled once daily regimen the next day. Do not take two doses of Xarelto on the same day.   Important Safety Information A possible side effect of Xarelto is bleeding. You should call your healthcare provider right away if you experience any of the following:   Bleeding from an injury or your nose that does not stop.   Unusual colored urine (red or dark brown) or unusual colored stools (red or black).   Unusual bruising for unknown reasons.   A serious fall or if you hit your head (even if there is no bleeding).  Some medicines may interact with Xarelto and might increase your risk of bleeding while on Xarelto. To help avoid this,  consult your healthcare provider or pharmacist prior to using any new prescription or non-prescription medications, including herbals, vitamins, non-steroidal anti-inflammatory drugs (NSAIDs) and supplements.  This website has more information on Xarelto: [email or web address]

## 2013-10-10 NOTE — Clinical Social Work Placement (Addendum)
Clinical Social Work Department  CLINICAL SOCIAL WORK PLACEMENT NOTE  Patient: Sheila Morgan Account Number: [id number] Admit date: 10/09/13  Clinical Social Worker: Rhea Pink LCSWA Date/time: 6/9/201511:30 AM  Clinical Social Work is seeking post-discharge placement for this patient at the following level of care: SKILLED NURSING (*CSW will update this form in Epic as items are completed)  10/10/2013 Patient/family provided with [facility] Department of Clinical Social Work's list of facilities offering this level of care within the geographic area requested by the patient (or if unable, by the patient's family).  10/10/2013 Patient/family informed of their freedom to choose among providers that offer the needed level of care, that participate in Medicare, Medicaid or managed care program needed by the patient, have an available bed and are willing to accept the patient.  10/10/2013 Patient/family informed of [facility]' ownership interest in [facility], as well as of the fact that they are under no obligation to receive care at this facility.  PASARR submitted to EDS on 10/11/2013 PASARR number received from EDS on 10/11/2013  FL2 transmitted to all facilities in geographic area requested by pt/family on 10/10/2013  FL2 transmitted to all facilities within larger geographic area on  Patient informed that his/her managed care company has contracts with or will negotiate with certain facilities, including the following:  Patient/family informed of bed offers received:  Patient chooses bed at [facility] Physician recommends and patient chooses bed at  Patient to be transferred to on 10/12/2013 Patient to be transferred to facility by [facility] The following physician request were entered in Epic:  Additional Comments:   Lubertha Sayres, MSW, [facility] Licensed Clinical Social Worker [phone] and [phone]

## 2013-10-10 NOTE — Clinical Social Work Psychosocial (Signed)
Clinical Social Work Department  BRIEF PSYCHOSOCIAL ASSESSMENT  Patient: Sheila Morgan  Account Number: [id number]   Admit date: 10/09/13  Clinical Social Worker Rhea Pink, MSW Date/Time: 10/10/2013 12:15 Referred by: Physician Date Referred: 10/09/13 Referred for   SNF Placement   Other Referral:  Interview type: Patient at bedside Other interview type: PSYCHOSOCIAL DATA  Living Status:Husband Admitted from facility:  Level of care:  Primary support name: Barnetta Hammersmith Primary support relationship to patient: Daughter Degree of support available:  Strong and vested  CURRENT CONCERNS  Current Concerns   Post-Acute Placement   Other Concerns:  SOCIAL WORK ASSESSMENT / PLAN  CSW met with pt at bedside to offer support and discuss  Discharge disposition. Patient reported that she needs to go to a ST-Rehab prior to returning home with her spouse.  Patient reported that she wants to go to [facility]. CSW contacted [facility] to confirm bed availability. [facility] is able to offer a bed to the patient. CSW explained to the patient that [facility]'s liaison would come and do paperwork with the patient. The patient requested that her daughter be notified so she could be present at the time. CSW informed the facility.  Patient was very appreciative of CSW visit and support.  re: PT recommendation for SNF.   Pt lives with   CSW explained placement process and answered questions.   Pt reports [facility]  as her preference    CSW completed FL2 and initiated SNF search.     Assessment/plan status: Information/Referral to [employer]  Other assessment/ plan:  Information/referral to community resources:  SNF   PTAR  PATIENT'S/FAMILY'S RESPONSE TO PLAN OF CARE:  Pt  reports she is agreeable to ST SNF in order to increase strength and independence with mobility prior to returning home  Pt verbalized understanding of placement process and appreciation for CSW assist.   Rhea Pink, MSW,  [place]

## 2013-10-10 NOTE — Progress Notes (Signed)
Subjective: 1 Day Post-Op Procedure(s) (LRB): TOTAL KNEE ARTHROPLASTY (Left) Patient reports pain as 4 on 0-10 scale.    Objective: Vital signs in last 24 hours: Temp:  [97.5 F (36.4 C)-97.9 F (36.6 C)] 97.5 F (36.4 C) (06/09 0352) Pulse Rate:  [63-109] 63 (06/09 0352) Resp:  [10-18] 18 (06/09 0800) BP: (138-165)/(69-99) 155/77 mmHg (06/09 0352) SpO2:  [97 %-100 %] 97 % (06/09 0352)  Intake/Output from previous day: 06/08 0701 - 06/09 0700 In: 3220 [P.O.:720; I.V.:2500] Out: 3105 [Urine:2775; Drains:280; Blood:50] Intake/Output this shift:     Recent Labs  10/10/13 0440  HGB 10.9*    Recent Labs  10/10/13 0440  WBC 18.4*  RBC 3.55*  HCT 32.4*  PLT 153    Recent Labs  10/10/13 0440  NA 138  K 4.7  CL 100  CO2 21  BUN 19  CREATININE 0.99  GLUCOSE 146*  CALCIUM 8.9   No results found for this basename: LABPT, INR,  in the last 72 hours  Neurologically intact ABD soft Neurovascular intact Sensation intact distally Intact pulses distally Dorsiflexion/Plantar flexion intact Incision: dressing C/D/I  Assessment/Plan: 1 Day Post-Op Procedure(s) (LRB): TOTAL KNEE ARTHROPLASTY (Left) Advance diet Up with therapy D/C IV fluids Discharge to SNF on Thursday Dressing Change today  Lyrika Souders J Quron Ruddy 10/10/2013, 8:26 AM

## 2013-10-10 NOTE — Progress Notes (Signed)
Physical Therapy Treatment Patient Details Name: Sheila Morgan MRN: [id number] DOB: [birth year] Today's Date: 10/10/2013    History of Present Illness 80 y.o. Female s/p L TKA    PT Comments    Pt continues to take increased time in powering up from sitting EOB to standing with RW, min-guard provided for safety with sit to stand transfers.  Pt ambulated 40 ft with RW and min-A for safety due to pt report of weakness; seated rest provided after 40 ft.  Pt tolerated seated exercises well.  Will continue to follow.   Follow Up Recommendations  SNF;Supervision for mobility/OOB     Equipment Recommendations  None recommended by PT    Recommendations for Other Services       Precautions / Restrictions Precautions Precautions: Fall;Knee Precaution Booklet Issued: Yes (comment) Precaution Comments: Reviewed safety precautions Required Braces or Orthoses: Knee Immobilizer - Left Knee Immobilizer - Left: On when out of bed or walking;Discontinue once straight leg raise with < 10 degree lag Restrictions Weight Bearing Restrictions: Yes LLE Weight Bearing: Weight bearing as tolerated    Mobility  Bed Mobility Overal bed mobility: Needs Assistance Bed Mobility: Supine to Sit     Supine to sit: Min guard     General bed mobility comments: Min guard for safety, pt required no physical assistance.  Transfers Overall transfer level: Needs assistance Equipment used: Rolling walker (2 wheeled) Transfers: Sit to/from Stand Sit to Stand: Min guard         General transfer comment: Pt continues to take increased time while rising up to stand, min-guard for balance.  Ambulation/Gait Ambulation/Gait assistance: Min assist Ambulation Distance (Feet): 40 Feet Assistive device: Rolling walker (2 wheeled) Gait Pattern/deviations: Step-to pattern;Decreased stance time - left;Decreased step length - right;Decreased stride length;Trunk flexed Gait velocity: decreased Gait velocity  interpretation: Below normal speed for age/gender General Gait Details: Min-A for safety due to pt-reported weakness, pt required seated rest after 40 ft.   Stairs            Wheelchair Mobility    Modified Rankin (Stroke Patients Only)       Balance Overall balance assessment: Needs assistance         Standing balance support: Bilateral upper extremity supported Standing balance-Leahy Scale: Poor Standing balance comment: Pt needs bil. UE support to stand.                    Cognition Arousal/Alertness: Awake/alert Behavior During Therapy: WFL for tasks assessed/performed Overall Cognitive Status: Within Functional Limits for tasks assessed                      Exercises Total Joint Exercises Ankle Circles/Pumps: AROM;Both;10 reps;Seated Quad Sets: AROM;Left;10 reps;Seated Long Arc Quad: AROM;5 reps;Left;Seated Knee Flexion: AROM;Left;5 reps;Seated Goniometric ROM: 10-80    General Comments        Pertinent Vitals/Pain Pt reports that her pain is tolerable at the moment.  Pt premedicated.    Home Living Family/patient expects to be discharged to:: Skilled nursing facility Living Arrangements: Spouse/significant other;Children             Additional Comments: Pt's daughter plans to live with her for as long as needed after pt returns home from SNF due to pt's husband's inability to assist.    Prior Function Level of Independence: Independent with assistive device(s)      Comments: Pt used SPC for ambulation and has a RW from previous surgeries.  PT Goals (current goals can now be found in the care plan section) Acute Rehab PT Goals PT Goal Formulation: With patient Time For Goal Achievement: 10/20/13 Potential to Achieve Goals: Good Progress towards PT goals: Progressing toward goals    Frequency  Min 3X/week    PT Plan Current plan remains appropriate    Co-evaluation             End of Session Equipment Utilized  During Treatment: Gait belt Activity Tolerance: Patient tolerated treatment well Patient left: in chair;with call bell/phone within reach     Time: 0819-0900 PT Time Calculation (min): 41 min  Charges:  $Gait Training: 23-37 mins $Therapeutic Exercise: 8-22 mins                    G Codes:      Yi Falletta, SPT 10/10/2013, 11:51 AM

## 2013-10-10 NOTE — Progress Notes (Signed)
Agree with SPT.    Zuriah Bordas, PT [phone]  

## 2013-10-11 DIAGNOSIS — D62 Acute posthemorrhagic anemia: Secondary | ICD-10-CM | POA: Diagnosis not present

## 2013-10-11 LAB — CBC
HCT: 26.3 % — ABNORMAL LOW (ref 36.0–46.0)
Hemoglobin: 8.7 g/dL — ABNORMAL LOW (ref 12.0–15.0)
MCH: 30.9 pg (ref 26.0–34.0)
MCHC: 33.1 g/dL (ref 30.0–36.0)
MCV: 93.3 fL (ref 78.0–100.0)
Platelets: 167 10*3/uL (ref 150–400)
RBC: 2.82 MIL/uL — ABNORMAL LOW (ref 3.87–5.11)
RDW: 14.9 % (ref 11.5–15.5)
WBC: 14.3 10*3/uL — ABNORMAL HIGH (ref 4.0–10.5)

## 2013-10-11 LAB — BASIC METABOLIC PANEL
BUN: 25 mg/dL — ABNORMAL HIGH (ref 6–23)
CO2: 24 mEq/L (ref 19–32)
CREATININE: 0.92 mg/dL (ref 0.50–1.10)
Calcium: 8.7 mg/dL (ref 8.4–10.5)
Chloride: 108 mEq/L (ref 96–112)
GFR calc non Af Amer: 58 mL/min — ABNORMAL LOW (ref >90)
GFR, EST AFRICAN AMERICAN: 67 mL/min — AB (ref >90)
Glucose, Bld: 169 mg/dL — ABNORMAL HIGH (ref 70–99)
POTASSIUM: 4.8 meq/L (ref 3.7–5.3)
Sodium: 144 mEq/L (ref 137–147)

## 2013-10-11 LAB — GLUCOSE, CAPILLARY
GLUCOSE-CAPILLARY: 157 mg/dL — AB (ref 70–99)
GLUCOSE-CAPILLARY: 132 mg/dL — AB (ref 70–99)
Glucose-Capillary: 148 mg/dL — ABNORMAL HIGH (ref 70–99)
Glucose-Capillary: 125 mg/dL — ABNORMAL HIGH (ref 70–99)

## 2013-10-11 LAB — PREPARE RBC (CROSSMATCH)

## 2013-10-11 MED ORDER — FUROSEMIDE 10 MG/ML IJ SOLN
20.0000 mg | Freq: Once | INTRAMUSCULAR | Status: AC
  Administered 2013-10-11: 20 mg via INTRAVENOUS
  Filled 2013-10-11: qty 2

## 2013-10-11 NOTE — Progress Notes (Signed)
Agree with SPT.    Tyner Codner, PT [phone]  

## 2013-10-11 NOTE — Progress Notes (Signed)
Physical Therapy Treatment Patient Details Name: Sheila Morgan MRN: [id number] DOB: 10/08/1932 Today's Date: 10/11/2013    History of Present Illness 80 y.o. Female s/p L TKA    PT Comments    Pt was receiving blood during PT session today, but was able to perform supine to/from sit and sit to/from stand with no physical assist.  Pt ambulated ~40 ft in room with improved ability to WB through L LE compared to yesterday.  Pt was also able to achieve full knee extension, but could not achieve knee flexion past 30 degrees due to pain.  Pt tolerated exercises in supine well.  Will continue to follow.   Follow Up Recommendations  SNF;Supervision for mobility/OOB     Equipment Recommendations  None recommended by PT    Recommendations for Other Services       Precautions / Restrictions Precautions Precautions: Fall;Knee Precaution Comments: Reviewed safety precautions Required Braces or Orthoses: Knee Immobilizer - Left Knee Immobilizer - Left: On when out of bed or walking;Discontinue once straight leg raise with < 10 degree lag Restrictions Weight Bearing Restrictions: Yes LLE Weight Bearing: Weight bearing as tolerated    Mobility  Bed Mobility Overal bed mobility: Needs Assistance Bed Mobility: Supine to Sit;Sit to Supine     Supine to sit: Supervision;HOB elevated Sit to supine: Supervision   General bed mobility comments: Pt required no VC or physical assist for supine to/from sit, supervision for safety due to IV and decreased strength/ROM in L LE.  Transfers Overall transfer level: Needs assistance Equipment used: Rolling walker (2 wheeled) Transfers: Sit to/from Stand Sit to Stand: Min guard         General transfer comment: Pt more confident today about putting weight through L LE and rises up to stand more quickly.  No VC required.  Min guard for safety with balance.  Ambulation/Gait Ambulation/Gait assistance: Min guard Ambulation Distance (Feet): 40  Feet Assistive device: Rolling walker (2 wheeled) Gait Pattern/deviations: Step-through pattern;Decreased stride length Gait velocity: decreased Gait velocity interpretation: Below normal speed for age/gender General Gait Details: Min guard for safety, pt required no VC nor physical assist during ambulation with RW.  Pt reports that she feels she can put more weight through her L LE today.  KI not used due to pt demonstrated ability to SLR with full knee extension in supine prior to mobility.   Stairs            Wheelchair Mobility    Modified Rankin (Stroke Patients Only)       Balance Overall balance assessment: Needs assistance Sitting-balance support: Feet supported;No upper extremity supported Sitting balance-Leahy Scale: Fair Sitting balance - Comments: Pt able to sit EOB with no UE support while adjusting robe, but would not be able to tolerate further balance challenge without UE support.   Standing balance support: Bilateral upper extremity supported;During functional activity Standing balance-Leahy Scale: Poor Standing balance comment: Pt maintains bil. UE support on RW during during standing.                    Cognition Arousal/Alertness: Awake/alert Behavior During Therapy: WFL for tasks assessed/performed Overall Cognitive Status: Within Functional Limits for tasks assessed                      Exercises Total Joint Exercises Short Arc Quad: AROM;Left;10 reps;Supine Heel Slides: AAROM;Left;Other reps (comment);Supine (2 reps, limited due to pt pain) Hip ABduction/ADduction: AAROM;Left;10 reps;Supine Straight Leg Raises: AROM;Left;5  reps;Supine Goniometric ROM: Pt at full extension (0 degrees) today, unable to fully assess flexion due to pt pain past 30 degrees of flexion today. Bridges: AROM;Right;5 reps;Supine;Other (comment) (Bridging with bil. UE support on trapeze to slide up in bed)    General Comments General comments (skin  integrity, edema, etc.): Pt receiving blood upon PT arrival and throughout session today.      Pertinent Vitals/Pain Pt reports increased L knee pain with flexion.  Pt does not desire pain meds at this time.    Home Living                      Prior Function            PT Goals (current goals can now be found in the care plan section) Acute Rehab PT Goals PT Goal Formulation: With patient Time For Goal Achievement: 10/20/13 Potential to Achieve Goals: Good Progress towards PT goals: Progressing toward goals    Frequency  Min 3X/week    PT Plan Current plan remains appropriate    Co-evaluation             End of Session Equipment Utilized During Treatment: Gait belt Activity Tolerance: Patient limited by pain Patient left: in bed;with call bell/phone within reach     Time: 1401-1429 PT Time Calculation (min): 28 min  Charges:                       G Codes:      Caylin Nass, SPT 10/11/2013, 2:45 PM

## 2013-10-11 NOTE — Progress Notes (Signed)
Subjective: 2 Days Post-Op Procedure(s) (LRB): TOTAL KNEE ARTHROPLASTY (Left) Patient reports pain as 4 on 0-10 scale.    Objective: Vital signs in last 24 hours: Temp:  [97.8 F (36.6 C)-98.3 F (36.8 C)] 98.3 F (36.8 C) (06/10 0741) Pulse Rate:  [64-72] 72 (06/10 0741) Resp:  [16-18] 16 (06/10 0741) BP: (129-142)/(57-65) 142/65 mmHg (06/10 0741) SpO2:  [97 %-100 %] 98 % (06/10 0741)  Intake/Output from previous day: 06/09 0701 - 06/10 0700 In: 960 [P.O.:960] Out: 725 [Urine:725] Intake/Output this shift:     Recent Labs  10/10/13 0440 10/11/13 0530  HGB 10.9* 8.7*    Recent Labs  10/10/13 0440 10/11/13 0530  WBC 18.4* 14.3*  RBC 3.55* 2.82*  HCT 32.4* 26.3*  PLT 153 167    Recent Labs  10/10/13 0440 10/11/13 0530  NA 138 144  K 4.7 4.8  CL 100 108  CO2 21 24  BUN 19 25*  CREATININE 0.99 0.92  GLUCOSE 146* 169*  CALCIUM 8.9 8.7   No results found for this basename: LABPT, INR,  in the last 72 hours  ABD soft Neurovascular intact Sensation intact distally Intact pulses distally Dorsiflexion/Plantar flexion intact Incision: scant drainage  Assessment/Plan: 2 Days Post-Op Procedure(s) (LRB): TOTAL KNEE ARTHROPLASTY (Left) Principal Problem:   Left knee DJD Active Problems:   HYPERLIPIDEMIA   UNSPECIFIED ANEMIA   HYPERTENSION   Polymyalgia rheumatica   CORONARY ARTERY BYPASS GRAFT, HX OF   Renal insufficiency, mild   Type II or unspecified type diabetes mellitus with renal manifestations, not stated as uncontrolled   DJD (degenerative joint disease) of knee   Postoperative anemia due to acute blood loss Will transfuse 2 units of PRBC due to the acute post operative blood loss anemia in the setting of known cardiac disease. Advance diet Up with therapy Discharge to SNF tomorrow to [facility] J 10/11/2013, 7:52 AM

## 2013-10-12 DIAGNOSIS — D649 Anemia, unspecified: Secondary | ICD-10-CM | POA: Diagnosis not present

## 2013-10-12 DIAGNOSIS — M1710 Unilateral primary osteoarthritis, unspecified knee: Secondary | ICD-10-CM | POA: Diagnosis not present

## 2013-10-12 DIAGNOSIS — D62 Acute posthemorrhagic anemia: Secondary | ICD-10-CM | POA: Diagnosis not present

## 2013-10-12 DIAGNOSIS — M353 Polymyalgia rheumatica: Secondary | ICD-10-CM | POA: Diagnosis not present

## 2013-10-12 DIAGNOSIS — R5383 Other fatigue: Secondary | ICD-10-CM | POA: Diagnosis not present

## 2013-10-12 DIAGNOSIS — M810 Age-related osteoporosis without current pathological fracture: Secondary | ICD-10-CM | POA: Diagnosis not present

## 2013-10-12 DIAGNOSIS — I10 Essential (primary) hypertension: Secondary | ICD-10-CM | POA: Diagnosis not present

## 2013-10-12 DIAGNOSIS — S8990XA Unspecified injury of unspecified lower leg, initial encounter: Secondary | ICD-10-CM | POA: Diagnosis not present

## 2013-10-12 DIAGNOSIS — M25569 Pain in unspecified knee: Secondary | ICD-10-CM | POA: Diagnosis not present

## 2013-10-12 DIAGNOSIS — M79609 Pain in unspecified limb: Secondary | ICD-10-CM | POA: Diagnosis not present

## 2013-10-12 DIAGNOSIS — E119 Type 2 diabetes mellitus without complications: Secondary | ICD-10-CM | POA: Diagnosis not present

## 2013-10-12 DIAGNOSIS — M7989 Other specified soft tissue disorders: Secondary | ICD-10-CM | POA: Diagnosis not present

## 2013-10-12 DIAGNOSIS — M25579 Pain in unspecified ankle and joints of unspecified foot: Secondary | ICD-10-CM | POA: Diagnosis not present

## 2013-10-12 DIAGNOSIS — M6281 Muscle weakness (generalized): Secondary | ICD-10-CM | POA: Diagnosis not present

## 2013-10-12 DIAGNOSIS — I2510 Atherosclerotic heart disease of native coronary artery without angina pectoris: Secondary | ICD-10-CM | POA: Diagnosis not present

## 2013-10-12 DIAGNOSIS — E1129 Type 2 diabetes mellitus with other diabetic kidney complication: Secondary | ICD-10-CM | POA: Diagnosis not present

## 2013-10-12 DIAGNOSIS — Z471 Aftercare following joint replacement surgery: Secondary | ICD-10-CM | POA: Diagnosis not present

## 2013-10-12 DIAGNOSIS — B029 Zoster without complications: Secondary | ICD-10-CM | POA: Diagnosis not present

## 2013-10-12 DIAGNOSIS — N182 Chronic kidney disease, stage 2 (mild): Secondary | ICD-10-CM | POA: Diagnosis not present

## 2013-10-12 DIAGNOSIS — I25810 Atherosclerosis of coronary artery bypass graft(s) without angina pectoris: Secondary | ICD-10-CM | POA: Diagnosis not present

## 2013-10-12 DIAGNOSIS — R5381 Other malaise: Secondary | ICD-10-CM | POA: Diagnosis not present

## 2013-10-12 DIAGNOSIS — E785 Hyperlipidemia, unspecified: Secondary | ICD-10-CM | POA: Diagnosis not present

## 2013-10-12 DIAGNOSIS — N390 Urinary tract infection, site not specified: Secondary | ICD-10-CM | POA: Diagnosis not present

## 2013-10-12 DIAGNOSIS — R262 Difficulty in walking, not elsewhere classified: Secondary | ICD-10-CM | POA: Diagnosis not present

## 2013-10-12 DIAGNOSIS — Z96659 Presence of unspecified artificial knee joint: Secondary | ICD-10-CM | POA: Diagnosis not present

## 2013-10-12 DIAGNOSIS — D72819 Decreased white blood cell count, unspecified: Secondary | ICD-10-CM | POA: Diagnosis not present

## 2013-10-12 DIAGNOSIS — IMO0002 Reserved for concepts with insufficient information to code with codable children: Secondary | ICD-10-CM | POA: Diagnosis not present

## 2013-10-12 DIAGNOSIS — M545 Low back pain, unspecified: Secondary | ICD-10-CM | POA: Diagnosis not present

## 2013-10-12 LAB — TYPE AND SCREEN
ABO/RH(D): AB POS
Antibody Screen: NEGATIVE
UNIT DIVISION: 0
Unit division: 0

## 2013-10-12 LAB — BASIC METABOLIC PANEL
BUN: 24 mg/dL — AB (ref 6–23)
CALCIUM: 8.9 mg/dL (ref 8.4–10.5)
CO2: 29 mEq/L (ref 19–32)
Chloride: 107 mEq/L (ref 96–112)
Creatinine, Ser: 0.83 mg/dL (ref 0.50–1.10)
GFR calc Af Amer: 76 mL/min — ABNORMAL LOW (ref >90)
GFR, EST NON AFRICAN AMERICAN: 65 mL/min — AB (ref >90)
Glucose, Bld: 99 mg/dL (ref 70–99)
POTASSIUM: 3.9 meq/L (ref 3.7–5.3)
Sodium: 147 mEq/L (ref 137–147)

## 2013-10-12 LAB — GLUCOSE, CAPILLARY
Glucose-Capillary: 100 mg/dL — ABNORMAL HIGH (ref 70–99)
Glucose-Capillary: 56 mg/dL — ABNORMAL LOW (ref 70–99)
Glucose-Capillary: 117 mg/dL — ABNORMAL HIGH (ref 70–99)

## 2013-10-12 LAB — CBC
HCT: 36.2 % (ref 36.0–46.0)
HEMOGLOBIN: 12.4 g/dL (ref 12.0–15.0)
MCH: 30.8 pg (ref 26.0–34.0)
MCHC: 34.3 g/dL (ref 30.0–36.0)
MCV: 89.8 fL (ref 78.0–100.0)
PLATELETS: 144 10*3/uL — AB (ref 150–400)
RBC: 4.03 MIL/uL (ref 3.87–5.11)
RDW: 16.1 % — ABNORMAL HIGH (ref 11.5–15.5)
WBC: 13.6 10*3/uL — AB (ref 4.0–10.5)

## 2013-10-12 MED ORDER — OXYCODONE HCL 5 MG PO TABS: ORAL_TABLET | ORAL | Status: DC

## 2013-10-12 MED ORDER — ACETAMINOPHEN 325 MG PO TABS: 650.0000 mg | ORAL_TABLET | Freq: Four times a day (QID) | ORAL | Status: DC | PRN

## 2013-10-12 MED ORDER — RIVAROXABAN 10 MG PO TABS: ORAL_TABLET | ORAL | Status: DC

## 2013-10-12 MED ORDER — POLYETHYLENE GLYCOL 3350 17 GM/SCOOP PO POWD: ORAL | Status: DC

## 2013-10-12 MED ORDER — DSS 100 MG PO CAPS: ORAL_CAPSULE | ORAL | Status: DC

## 2013-10-12 NOTE — Discharge Summary (Signed)
Patient ID: Sheila Morgan MRN: [id number] DOB/AGE: [birth year] 78 y.o.  Admit date: 10/09/2013 Discharge date: 10/12/2013  Admission Diagnoses:  Principal Problem:   Left knee DJD Active Problems:   HYPERLIPIDEMIA   UNSPECIFIED ANEMIA   HYPERTENSION   Polymyalgia rheumatica   CORONARY ARTERY BYPASS GRAFT, HX OF   Renal insufficiency, mild   Type II or unspecified type diabetes mellitus with renal manifestations, not stated as uncontrolled   DJD (degenerative joint disease) of knee   Postoperative anemia due to acute blood loss   Discharge Diagnoses:  Same  Past Medical History  Diagnosis Date  . Hyperlipidemia   . Hypertension   . Diabetes mellitus     2  . Arthritis   . Hiatal hernia   . Hemorrhoids   . Osteoporosis   . PONV (postoperative nausea and vomiting)   . Coronary artery disease     status post post LAD angioplasty in 1993  and subsequent coronary artery bypass grafting x1 with a LIMA to the LAD July of 1994. She had stenting of her RCA with a bare-metal stent November 2002.  Marland Kitchen Left knee DJD   . Lightheadedness     has seen Dr Gwenlyn Found and ENT.. cant find out why  . Heart murmur   . Cancer     skin on nose .  mylenoma  . History of blood transfusion   . HOH (hard of hearing)     Surgeries: Procedure(s): TOTAL KNEE ARTHROPLASTY on 10/09/2013   Consultants:    Discharged Condition: Improved  Hospital Course: Sheila Morgan is an 80 y.o. female who was admitted 10/09/2013 for operative treatment ofLeft knee DJD. Patient has severe unremitting pain that affects sleep, daily activities, and work/hobbies. After pre-op clearance the patient was taken to the operating room on 10/09/2013 and underwent  Procedure(s): TOTAL KNEE ARTHROPLASTY.    Patient was given perioperative antibiotics:     Anti-infectives   Start     Dose/Rate Route Frequency Ordered Stop   10/09/13 1700  ceFAZolin (ANCEF) IVPB 1 g/50 mL premix     1 g 100 mL/hr over 30 Minutes Intravenous  Every 6 hours 10/09/13 1058 10/09/13 2232   10/09/13 0600  ceFAZolin (ANCEF) IVPB 2 g/50 mL premix     2 g 100 mL/hr over 30 Minutes Intravenous On call to O.R. 10/08/13 1224 10/09/13 0723       Patient was given sequential compression devices, early ambulation, and chemoprophylaxis to prevent DVT.  Post op day 2 patients hemoglobin was 8.7   She received 2 units of packed red cell with 20 mg lasix IV between units due to her symptomatic post op blood loss anemia in the setting of coronary artery disease.  She did well with the transfusion and tolerated mobility better on post op day 3.  Patient benefited maximally from hospital stay and there were no other complications.    Recent vital signs:  Patient Vitals for the past 24 hrs:  BP Temp Temp src Pulse Resp SpO2  10/12/13 0800 - - - - 20 98 %  10/12/13 0600 155/66 mmHg 98 F (36.7 C) - 73 18 97 %  10/11/13 2013 151/60 mmHg 97.9 F (36.6 C) - 61 18 98 %  10/11/13 1632 155/58 mmHg 98 F (36.7 C) Oral 70 18 100 %  10/11/13 1347 154/64 mmHg 98.2 F (36.8 C) Oral 66 18 100 %  10/11/13 1318 140/72 mmHg 98 F (36.7 C) Oral 62 18 100 %  10/11/13 1245 134/77 mmHg 97.8 F (36.6 C) Oral 67 18 100 %     Recent laboratory studies:   Recent Labs  10/11/13 0530 10/12/13 0550 10/12/13 0850  WBC 14.3*  --  13.6*  HGB 8.7*  --  12.4  HCT 26.3*  --  36.2  PLT 167  --  144*  NA 144 147  --   K 4.8 3.9  --   CL 108 107  --   CO2 24 29  --   BUN 25* 24*  --   CREATININE 0.92 0.83  --   GLUCOSE 169* 99  --   CALCIUM 8.7 8.9  --      Discharge Medications:     Medication List    STOP taking these medications       alendronate 70 MG tablet  Commonly known as:  FOSAMAX     aspirin 81 MG tablet      TAKE these medications       acetaminophen 325 MG tablet  Commonly known as:  TYLENOL  Take 2 tablets (650 mg total) by mouth every 6 (six) hours as needed for mild pain (or Fever >/= 101).     amLODipine 5 MG tablet  Commonly  known as:  NORVASC  Take 5 mg by mouth daily.     CALCIUM 600-D PO  Take 1 tablet by mouth 2 (two) times daily.     carvedilol 25 MG tablet  Commonly known as:  COREG  Take 1 tablet (25 mg total) by mouth 2 (two) times daily with a meal.     COLCRYS 0.6 MG tablet  Generic drug:  colchicine  Take 0.6 mg by mouth daily.     DSS 100 MG Caps  1 tab 2 times a day while on narcotics.  STOOL SOFTENER     glucose blood test strip  Commonly known as:  ONE TOUCH ULTRA TEST  Use as instructed     isosorbide mononitrate 30 MG 24 hr tablet  Commonly known as:  IMDUR  Take 15 mg by mouth daily.     losartan 100 MG tablet  Commonly known as:  COZAAR  Take 1 tablet (100 mg total) by mouth daily.     metFORMIN 500 MG tablet  Commonly known as:  GLUCOPHAGE  Take 500 mg by mouth daily with breakfast.     multivitamin with minerals Tabs tablet  Take 1 tablet by mouth daily.     onetouch ultrasoft lancets  Use as instructed     oxyCODONE 5 MG immediate release tablet  Commonly known as:  Oxy IR/ROXICODONE  1-2 tablets every 4-6 hrs as needed for pain     polyethylene glycol powder powder  Commonly known as:  GLYCOLAX/MIRALAX  17 grams in 8 ounces of water 2 times a day     predniSONE 5 MG tablet  Commonly known as:  DELTASONE  Take 5 mg by mouth daily.     rivaroxaban 10 MG Tabs tablet  Commonly known as:  XARELTO  1 tab a day for the next 30 days to prevent blood clots     simvastatin 40 MG tablet  Commonly known as:  ZOCOR  Take 40 mg by mouth every evening.     vitamin B-12 1000 MCG tablet  Commonly known as:  CYANOCOBALAMIN  Take 1,000 mcg by mouth daily.        Diagnostic Studies: No results found.  Disposition: to [facility]  Discharge  Instructions   CPM    Complete by:  As directed   Continuous passive motion machine (CPM):      Use the CPM from 0 to 90 for 6 hours per day.       You may break it up into 2 or 3 sessions per day.      Use CPM  for 2 weeks or until you are told to stop.     Call MD / Call 911    Complete by:  As directed   If you experience chest pain or shortness of breath, CALL 911 and be transported to the hospital emergency room.  If you develope a fever above 101 F, pus (white drainage) or increased drainage or redness at the wound, or calf pain, call your surgeon's office.     Change dressing    Complete by:  As directed   Change the dressing daily with sterile 4 x 4 inch gauze dressing and apply TED hose.  You may clean the incision with alcohol prior to redressing.     Constipation Prevention    Complete by:  As directed   Drink plenty of fluids.  Prune juice may be helpful.  You may use a stool softener, such as Colace (over the counter) 100 mg twice a day.  Use MiraLax (over the counter) for constipation as needed.     Diet - low sodium heart healthy    Complete by:  As directed      Discharge instructions    Complete by:  As directed   Total Knee Replacement Care After Refer to this sheet in the next few weeks. These discharge instructions provide you with general information on caring for yourself after you leave the hospital. Your caregiver may also give you specific instructions. Your treatment has been planned according to the most current medical practices available, but unavoidable complications sometimes occur. If you have any problems or questions after discharge, please call your caregiver. Regaining a near full range of motion of your knee within the first 3 to 6 weeks after surgery is critical. [place] may resume a normal diet and activities as directed.  Perform exercises as directed.  Place gray foam block, curve side up under heel at all times except when in CPM or when walking.  DO NOT modify, tear, cut, or change in any way the gray foam block. You will receive physical therapy daily  Take showers instead of baths until informed otherwise.  You may shower on Sunday.   Please wash whole leg including wound with soap and water  Change bandages (dressings)daily It is OK to take over-the-counter tylenol in addition to the oxycodone for pain, discomfort, or fever. Oxycodone is VERY constipating.  Please take stool softener twice a day and laxatives daily until bowels are regular Eat a well-balanced diet.  Avoid lifting or driving until you are instructed otherwise.  Make an appointment to see your caregiver for stitches (suture) or staple removal as directed.  If you have been sent home with a continuous passive motion machine (CPM machine), 0-90 degrees 6 hrs a day   2 hrs a shift SEEK MEDICAL CARE IF: You have swelling of your calf or leg.  You develop shortness of breath or chest pain.  You have redness, swelling, or increasing pain in the wound.  There is pus or any unusual drainage coming from the surgical site.  You notice a bad smell coming from the surgical site  or dressing.  The surgical site breaks open after sutures or staples have been removed.  There is persistent bleeding from the suture or staple line.  You are getting worse or are not improving.  You have any other questions or concerns.  SEEK IMMEDIATE MEDICAL CARE IF:  You have a fever.  You develop a rash.  You have difficulty breathing.  You develop any reaction or side effects to medicines given.  Your knee motion is decreasing rather than improving.  MAKE SURE YOU:  Understand these instructions.  Will watch your condition.  Will get help right away if you are not doing well or get worse.     Do not put a pillow under the knee. Place it under the heel.    Complete by:  As directed   Place gray foam block, curve side up under heel at all times except when in CPM or when walking.  DO NOT modify, tear, cut, or change in any way the gray foam block.     Increase activity slowly as tolerated    Complete by:  As directed      TED hose    Complete by:  As directed   Use stockings (TED  hose) for 2 weeks on both leg(s).  You may remove them at night for sleeping.           Follow-up Information   Follow up with Lorn Junes, MD On 10/23/2013. (appt time 2:30 pm)    Specialty:  Orthopedic Surgery   Contact information:   [address] [place] [address] [phone]        Signed: Linda Hedges 10/12/2013, 10:37 AM

## 2013-10-12 NOTE — Progress Notes (Signed)
Results for [name], [name] (MRN [id number]) as of 10/12/2013 10:31  Ref. Range 10/11/2013 21:19 10/12/2013 05:50 10/12/2013 06:31 10/12/2013 06:46 10/12/2013 08:50  Glucose-Capillary Latest Range: 70-99 mg/dL 125 (H)  56 (L) 100 (H)   CBG at 06:30 was 56 mg/dl.  Recommend decreasing Novolog correction scale to SENSITIVE TID & HS and continuing the Novolog 3 units meal coverage if eating at least 50% of meal. Harvel Ricks RN BSN CDE

## 2013-10-12 NOTE — Clinical Social Work Note (Signed)
Discharge summary faxed to [facility]. CSW met with pt and pt's daughter at bedside to discuss discharge. Both pt and pt's daughter understanding and agreeable to discharge disposition. CSW completed discharge packet and placed on pt's shadow chart. EMS [facility]) has been scheduled at pt and pt's daughter request. RN updated on information above.  Lubertha Sayres, MSW, [facility] Licensed Clinical Social Worker [phone] and [phone]

## 2013-10-12 NOTE — Progress Notes (Signed)
Patient discharged from room 5N32 at this time. Alert and in stable condition. IV site d/c'd. Report given to nurse Kathlee Nations at [facility]. Left unit via stretcher by [facility]AR with all belongings.

## 2013-10-13 ENCOUNTER — Encounter: Payer: Self-pay | Admitting: Adult Health

## 2013-10-13 ENCOUNTER — Other Ambulatory Visit: Payer: Self-pay

## 2013-10-13 ENCOUNTER — Non-Acute Institutional Stay (SKILLED_NURSING_FACILITY): Payer: Medicare Other | Admitting: Adult Health

## 2013-10-13 DIAGNOSIS — I10 Essential (primary) hypertension: Secondary | ICD-10-CM

## 2013-10-13 DIAGNOSIS — IMO0002 Reserved for concepts with insufficient information to code with codable children: Secondary | ICD-10-CM | POA: Diagnosis not present

## 2013-10-13 DIAGNOSIS — M1710 Unilateral primary osteoarthritis, unspecified knee: Secondary | ICD-10-CM | POA: Diagnosis not present

## 2013-10-13 DIAGNOSIS — E785 Hyperlipidemia, unspecified: Secondary | ICD-10-CM

## 2013-10-13 DIAGNOSIS — M353 Polymyalgia rheumatica: Secondary | ICD-10-CM

## 2013-10-13 DIAGNOSIS — M109 Gout, unspecified: Secondary | ICD-10-CM

## 2013-10-13 DIAGNOSIS — I2510 Atherosclerotic heart disease of native coronary artery without angina pectoris: Secondary | ICD-10-CM

## 2013-10-13 DIAGNOSIS — M179 Osteoarthritis of knee, unspecified: Secondary | ICD-10-CM

## 2013-10-13 DIAGNOSIS — E1129 Type 2 diabetes mellitus with other diabetic kidney complication: Secondary | ICD-10-CM

## 2013-10-13 MED ORDER — OXYCODONE HCL 5 MG PO TABS: ORAL_TABLET | ORAL | Status: DC

## 2013-10-13 NOTE — Telephone Encounter (Signed)
Rx faxed to [facility] @ [phone], phone number [phone]  

## 2013-10-14 ENCOUNTER — Other Ambulatory Visit: Payer: Self-pay | Admitting: Cardiovascular Disease

## 2013-10-14 ENCOUNTER — Other Ambulatory Visit: Payer: Self-pay | Admitting: Internal Medicine

## 2013-10-15 DIAGNOSIS — M109 Gout, unspecified: Secondary | ICD-10-CM | POA: Insufficient documentation

## 2013-10-15 NOTE — Progress Notes (Signed)
Patient ID: Sheila Morgan, female   DOB: [birth year], 80 y.o.   MRN: [id number]               PROGRESS NOTE  DATE: 10/13/2013  FACILITY: Nursing Home Location: [facility]  LEVEL OF CARE: SNF (31)  Acute Visit  CHIEF COMPLAINT:  Follow-up Hospitalization   HISTORY OF PRESENT ILLNESS:  This is a 80 year old female who has been admitted to [facility] on 10/12/13 on 10/12/13 from [facility] with Left Knee DJD S/P Left Total Knee Arthroplasty. She has been admitted for a short-term rehabilitation.  REASSESSMENT OF ONGOING PROBLEM(S):  HYPERLIPIDEMIA: No complications from the medications presently being used. 6/14 lipid panel showed : chol 157  Trig 89  HDL 72  LDL 66  HTN: Pt 's HTN remains stable.  Denies CP, sob, DOE, pedal edema, headaches, dizziness or visual disturbances.  No complications from the medications currently being used.  Last BP : 134/82  DM:pt's DM remains stable.  Pt denies polyuria, polydipsia, polyphagia, changes in vision or hypoglycemic episodes.  No complications noted from the medication presently being used.   3/15  hemoglobin A1c is: 6.7   PAST MEDICAL HISTORY : Reviewed.  No changes/see problem list  CURRENT MEDICATIONS: Reviewed per MAR/see medication list  REVIEW OF SYSTEMS:  GENERAL: no change in appetite, no fatigue, no weight changes, no fever, chills or weakness RESPIRATORY: no cough, SOB, DOE, wheezing, hemoptysis CARDIAC: no chest pain, edema or palpitations GI: no abdominal pain, diarrhea, constipation, heart burn, nausea or vomiting  PHYSICAL EXAMINATION  GENERAL: no acute distress, normal body habitus EYES: conjunctivae normal, sclerae normal, normal eye lids NECK: supple, trachea midline, no neck masses, no thyroid tenderness, no thyromegaly LYMPHATICS: no LAN in the neck, no supraclavicular LAN RESPIRATORY: breathing is even & unlabored, BS CTAB CARDIAC: RRR, no murmur,no extra heart sounds, no edema GI:  abdomen soft, normal BS, no masses, no tenderness, no hepatomegaly, no splenomegaly EXTREMITIES: able to move all 4 extremities; has limited ROM on LLE due to surgery PSYCHIATRIC: the patient is alert & oriented to person, affect & behavior appropriate  LABS/RADIOLOGY: Labs reviewed: Basic Metabolic Panel:  Recent Labs  10/10/13 0440 10/11/13 0530 10/12/13 0550  NA 138 144 147  K 4.7 4.8 3.9  CL 100 108 107  CO2 21 24 29   GLUCOSE 146* 169* 99  BUN 19 25* 24*  CREATININE 0.99 0.92 0.83  CALCIUM 8.9 8.7 8.9   Liver Function Tests:  Recent Labs  12/03/12 1435 09/28/13 1555  AST 50* 43*  ALT 61* 71*  ALKPHOS 78 89  BILITOT 0.4 0.4  PROT 6.8 6.8  ALBUMIN 3.7 4.1   CBC:  Recent Labs  05/09/13 1555  09/28/13 1555 10/10/13 0440 10/11/13 0530 10/12/13 0850  WBC 8.6  < > 8.7 18.4* 14.3* 13.6*  NEUTROABS 6.9  --  6.5  --   --   --   HGB 14.4  < > 13.6 10.9* 8.7* 12.4  HCT 43.8  < > 39.7 32.4* 26.3* 36.2  MCV 93.6  < > 92.1 91.3 93.3 89.8  PLT 119*  < > 194 153 167 144*  < > = values in this interval not displayed.   Lipid Panel:  Recent Labs  10/17/12 1159  HDL 72.90    CBG:  Recent Labs  10/12/13 0631 10/12/13 0646 10/12/13 1111  GLUCAP 56* 100* 117*     ASSESSMENT/PLAN:  Left Knee DJD S/P Left Total Knee  Arthroplasty - for rehabilitation Hyperlipidemia - continue Zocor Hypertension -  Well-controlled; continue Norvasc, Coreg, Imdur and Cozaar Polymyalgia Rheumatica - continue Prednisone Diabetes Mellitus, type 2 - well-controlled; continue Glucophage Gout - continue Colchicine CAD - stable   CPT CODE: 57262  Seth Bake - NP [facility] [phone]

## 2013-10-16 NOTE — Telephone Encounter (Signed)
Rx was sent to pharmacy electronically. 

## 2013-10-17 ENCOUNTER — Non-Acute Institutional Stay (SKILLED_NURSING_FACILITY): Payer: Medicare Other | Admitting: Internal Medicine

## 2013-10-17 DIAGNOSIS — I10 Essential (primary) hypertension: Secondary | ICD-10-CM

## 2013-10-17 DIAGNOSIS — I2510 Atherosclerotic heart disease of native coronary artery without angina pectoris: Secondary | ICD-10-CM | POA: Diagnosis not present

## 2013-10-17 DIAGNOSIS — E1129 Type 2 diabetes mellitus with other diabetic kidney complication: Secondary | ICD-10-CM | POA: Diagnosis not present

## 2013-10-17 DIAGNOSIS — M1710 Unilateral primary osteoarthritis, unspecified knee: Secondary | ICD-10-CM | POA: Diagnosis not present

## 2013-10-17 DIAGNOSIS — M179 Osteoarthritis of knee, unspecified: Secondary | ICD-10-CM

## 2013-10-17 DIAGNOSIS — IMO0002 Reserved for concepts with insufficient information to code with codable children: Secondary | ICD-10-CM | POA: Diagnosis not present

## 2013-10-17 NOTE — Progress Notes (Signed)
HISTORY & PHYSICAL  DATE: 10/17/2013   FACILITY: [facility]  LEVEL OF CARE: SNF (31)  ALLERGIES:  No Known Allergies  CHIEF COMPLAINT:  Manage left knee osteoarthritis, CAD and diabetes mellitus  HISTORY OF PRESENT ILLNESS: Patient is a 80 year old Caucasian female.  KNEE OSTEOARTHRITIS: Patient had a history of pain and functional disability in the knee due to end-stage osteoarthritis and has failed nonsurgical conservative treatments. Patient had worsening of pain with activity and weight bearing, pain that interfered with activities of daily living & pain with passive range of motion. Therefore patient underwent total knee arthroplasty and tolerated the procedure well. Patient is admitted to this facility for sort short-term rehabilitation. Patient denies knee pain.  CAD: The angina has been stable. The patient denies dyspnea on exertion, orthopnea, pedal edema, palpitations and paroxysmal nocturnal dyspnea. No complications noted from the medication presently being used.  DM:pt's DM remains stable.  Pt denies polyuria, polydipsia, polyphagia, changes in vision or hypoglycemic episodes.  No complications noted from the medication presently being used.  Last hemoglobin A1c is: Not available.  PAST MEDICAL HISTORY :  Past Medical History  Diagnosis Date  . Hyperlipidemia   . Hypertension   . Diabetes mellitus     2  . Arthritis   . Hiatal hernia   . Hemorrhoids   . Osteoporosis   . PONV (postoperative nausea and vomiting)   . Coronary artery disease     status post post LAD angioplasty in 1993  and subsequent coronary artery bypass grafting x1 with a LIMA to the LAD July of 1994. She had stenting of her RCA with a bare-metal stent November 2002.  Marland Kitchen Left knee DJD   . Lightheadedness     has seen Dr Gwenlyn Found and ENT.. cant find out why  . Heart murmur   . Cancer     skin on nose .  mylenoma  . History of blood transfusion   . HOH (hard of  hearing)     PAST SURGICAL HISTORY: Past Surgical History  Procedure Laterality Date  . Coronary angioplasty with stent placement  03/09/01    RCA  . Total hip arthroplasty    . Coronary artery bypass graft  1994  . Joint replacement Right 1998    hip  . Skin graft      to nose skin cancer  . Total knee arthroplasty Left 10/09/2013    Procedure: TOTAL KNEE ARTHROPLASTY;  Surgeon: Lorn Junes, MD;  Location: [facility];  Service: Orthopedics;  Laterality: Left;    SOCIAL HISTORY:  reports that she has never smoked. She has never used smokeless tobacco. She reports that she does not drink alcohol or use illicit drugs.  FAMILY HISTORY:  Family History  Problem Relation Age of Onset  . Heart disease Father   . Cancer Sister     breast  . Diabetes Sister   . Heart disease Brother   . Heart disease Brother   . Heart disease Brother   . Diabetes Sister     CURRENT MEDICATIONS: Reviewed per MAR/see medication list  REVIEW OF SYSTEMS:  See HPI otherwise 14 point ROS is negative.  PHYSICAL EXAMINATION  VS:  See VS section  GENERAL: no acute distress, normal body habitus EYES: conjunctivae normal, sclerae normal, normal eye lids MOUTH/THROAT: lips without lesions,no lesions in the mouth,tongue is without lesions,uvula elevates in midline NECK: supple, trachea midline, no neck masses, no thyroid tenderness,  no thyromegaly LYMPHATICS: no LAN in the neck, no supraclavicular LAN RESPIRATORY: breathing is even & unlabored, BS CTAB CARDIAC: RRR, no murmur,no extra heart sounds, no edema GI:  ABDOMEN: abdomen soft, normal BS, no masses, no tenderness  LIVER/SPLEEN: no hepatomegaly, no splenomegaly MUSCULOSKELETAL: HEAD: normal to inspection  EXTREMITIES: LEFT UPPER EXTREMITY: full range of motion, normal strength & tone RIGHT UPPER EXTREMITY:  full range of motion, normal strength & tone LEFT LOWER EXTREMITY:  range of motion not tested due to surgery, normal strength & tone RIGHT  LOWER EXTREMITY:  full range of motion, normal strength & tone PSYCHIATRIC: the patient is alert & oriented to person, affect & behavior appropriate  LABS/RADIOLOGY:  Labs reviewed: Basic Metabolic Panel:  Recent Labs  10/10/13 0440 10/11/13 0530 10/12/13 0550  NA 138 144 147  K 4.7 4.8 3.9  CL 100 108 107  CO2 21 24 29   GLUCOSE 146* 169* 99  BUN 19 25* 24*  CREATININE 0.99 0.92 0.83  CALCIUM 8.9 8.7 8.9   Liver Function Tests:  Recent Labs  12/03/12 1435 09/28/13 1555  AST 50* 43*  ALT 61* 71*  ALKPHOS 78 89  BILITOT 0.4 0.4  PROT 6.8 6.8  ALBUMIN 3.7 4.1   CBC:  Recent Labs  05/09/13 1555  09/28/13 1555 10/10/13 0440 10/11/13 0530 10/12/13 0850  WBC 8.6  < > 8.7 18.4* 14.3* 13.6*  NEUTROABS 6.9  --  6.5  --   --   --   HGB 14.4  < > 13.6 10.9* 8.7* 12.4  HCT 43.8  < > 39.7 32.4* 26.3* 36.2  MCV 93.6  < > 92.1 91.3 93.3 89.8  PLT 119*  < > 194 153 167 144*  < > = values in this interval not displayed.  CBG:  Recent Labs  10/12/13 0631 10/12/13 0646 10/12/13 1111  GLUCAP 56* 100* 117*    ASSESSMENT/PLAN:  Left knee osteoarthritis- status post total knee arthroplasty. Continue rehabilitation. CAD-stable Diabetes mellitus-continue current medications Elevated liver functions-recheck Leukocytosis-check CBC w diff Thrombocytopenia-recheck Constipation-well controlled Hypertension-well controlled  I have reviewed patient's medical records received at admission/from hospitalization.  CPT CODE: 16109  Gayani Y Dasanayaka, [place] [phone]

## 2013-10-23 DIAGNOSIS — M25579 Pain in unspecified ankle and joints of unspecified foot: Secondary | ICD-10-CM | POA: Diagnosis not present

## 2013-10-23 DIAGNOSIS — Z96659 Presence of unspecified artificial knee joint: Secondary | ICD-10-CM | POA: Diagnosis not present

## 2013-10-23 DIAGNOSIS — Z471 Aftercare following joint replacement surgery: Secondary | ICD-10-CM | POA: Diagnosis not present

## 2013-10-27 ENCOUNTER — Encounter: Payer: Self-pay | Admitting: Adult Health

## 2013-10-27 ENCOUNTER — Non-Acute Institutional Stay (SKILLED_NURSING_FACILITY): Payer: Medicare Other | Admitting: Adult Health

## 2013-10-27 DIAGNOSIS — B029 Zoster without complications: Secondary | ICD-10-CM

## 2013-10-27 HISTORY — DX: Zoster without complications: B02.9

## 2013-10-27 NOTE — Progress Notes (Signed)
Patient ID: Sheila Morgan, female   DOB: [birth year], 80 y.o.   MRN: [id number]              PROGRESS NOTE  DATE:  10/27/13  FACILITY: Nursing Home Location: [facility]  LEVEL OF CARE: SNF (31)  Acute Visit  CHIEF COMPLAINT:  Manage Shingles   HISTORY OF PRESENT ILLNESS:  This is a 80 year old female who was noted to have erythematous rashes with small blisters on her right upper back. Patient reports area to be painful when she lie down on the bed. No fever noted.    PAST MEDICAL HISTORY : Reviewed.  No changes/see problem list  CURRENT MEDICATIONS: Reviewed per MAR/see medication list  REVIEW OF SYSTEMS:  GENERAL: no change in appetite, no fatigue, no weight changes, no fever, chills or weakness RESPIRATORY: no cough, SOB, DOE, wheezing, hemoptysis CARDIAC: no chest pain, edema or palpitations GI: no abdominal pain, diarrhea, constipation, heart burn, nausea or vomiting  PHYSICAL EXAMINATION  GENERAL: no acute distress, normal body habitus NECK: supple, trachea midline, no neck masses, no thyroid tenderness, no thyromegaly LYMPHATICS: no LAN in the neck, no supraclavicular LAN RESPIRATORY: breathing is even & unlabored, BS CTAB CARDIAC: RRR, no murmur,no extra heart sounds, no edema GI: abdomen soft, normal BS, no masses, no tenderness, no hepatomegaly, no splenomegaly EXTREMITIES: able to move all 4 extremities; has limited ROM on LLE due to surgery PSYCHIATRIC: the patient is alert & oriented to person, affect & behavior appropriate  LABS/RADIOLOGY: Labs reviewed: Basic Metabolic Panel:  Recent Labs  10/10/13 0440 10/11/13 0530 10/12/13 0550  NA 138 144 147  K 4.7 4.8 3.9  CL 100 108 107  CO2 21 24 29   GLUCOSE 146* 169* 99  BUN 19 25* 24*  CREATININE 0.99 0.92 0.83  CALCIUM 8.9 8.7 8.9   Liver Function Tests:  Recent Labs  12/03/12 1435 09/28/13 1555  AST 50* 43*  ALT 61* 71*  ALKPHOS 78 89  BILITOT 0.4 0.4  PROT 6.8 6.8    ALBUMIN 3.7 4.1   CBC:  Recent Labs  05/09/13 1555  09/28/13 1555 10/10/13 0440 10/11/13 0530 10/12/13 0850  WBC 8.6  < > 8.7 18.4* 14.3* 13.6*  NEUTROABS 6.9  --  6.5  --   --   --   HGB 14.4  < > 13.6 10.9* 8.7* 12.4  HCT 43.8  < > 39.7 32.4* 26.3* 36.2  MCV 93.6  < > 92.1 91.3 93.3 89.8  PLT 119*  < > 194 153 167 144*  < > = values in this interval not displayed.   CBG:  Recent Labs  10/12/13 0631 10/12/13 0646 10/12/13 1111  GLUCAP 56* 100* 117*     ASSESSMENT/PLAN:  Shingles - start Acyclovir 800 mg 1 PO 5X/day X 7 days  CPT CODE: 62130  Monina Vargas - NP [facility] [phone]

## 2013-11-09 ENCOUNTER — Other Ambulatory Visit (HOSPITAL_COMMUNITY): Payer: Self-pay | Admitting: Cardiology

## 2013-11-09 ENCOUNTER — Ambulatory Visit (HOSPITAL_COMMUNITY): Payer: No Typology Code available for payment source | Attending: Orthopedic Surgery | Admitting: Cardiology

## 2013-11-09 DIAGNOSIS — M7989 Other specified soft tissue disorders: Secondary | ICD-10-CM | POA: Diagnosis not present

## 2013-11-09 DIAGNOSIS — E785 Hyperlipidemia, unspecified: Secondary | ICD-10-CM | POA: Insufficient documentation

## 2013-11-09 DIAGNOSIS — E119 Type 2 diabetes mellitus without complications: Secondary | ICD-10-CM | POA: Insufficient documentation

## 2013-11-09 DIAGNOSIS — M79609 Pain in unspecified limb: Secondary | ICD-10-CM | POA: Insufficient documentation

## 2013-11-09 DIAGNOSIS — M79606 Pain in leg, unspecified: Secondary | ICD-10-CM

## 2013-11-09 DIAGNOSIS — I10 Essential (primary) hypertension: Secondary | ICD-10-CM | POA: Insufficient documentation

## 2013-11-09 DIAGNOSIS — I2510 Atherosclerotic heart disease of native coronary artery without angina pectoris: Secondary | ICD-10-CM | POA: Diagnosis not present

## 2013-11-09 NOTE — Progress Notes (Signed)
Unilateral lower venous duplex performed  

## 2013-11-10 ENCOUNTER — Ambulatory Visit (INDEPENDENT_AMBULATORY_CARE_PROVIDER_SITE_OTHER): Payer: Medicare Other | Admitting: Internal Medicine

## 2013-11-10 ENCOUNTER — Encounter: Payer: Self-pay | Admitting: Internal Medicine

## 2013-11-10 VITALS — BP 138/60 | HR 77 | Temp 98.1°F

## 2013-11-10 DIAGNOSIS — IMO0002 Reserved for concepts with insufficient information to code with codable children: Secondary | ICD-10-CM | POA: Diagnosis not present

## 2013-11-10 DIAGNOSIS — M545 Low back pain, unspecified: Secondary | ICD-10-CM | POA: Diagnosis not present

## 2013-11-10 DIAGNOSIS — D649 Anemia, unspecified: Secondary | ICD-10-CM | POA: Diagnosis not present

## 2013-11-10 DIAGNOSIS — I2510 Atherosclerotic heart disease of native coronary artery without angina pectoris: Secondary | ICD-10-CM

## 2013-11-10 DIAGNOSIS — D72819 Decreased white blood cell count, unspecified: Secondary | ICD-10-CM | POA: Diagnosis not present

## 2013-11-10 DIAGNOSIS — S90822A Blister (nonthermal), left foot, initial encounter: Secondary | ICD-10-CM

## 2013-11-10 NOTE — Progress Notes (Signed)
   Subjective:    Patient ID: Sheila Morgan, female    DOB: [birth year], 80 y.o.   MRN: [id number]  HPI  She saw Dr Noemi Chapel her orthopedist 11/09/13 for postop checkup following a left total knee replacement which was done 10/09/13. She is now in skilled nursing care  Yesterday morning she awoke with stiffness and pain in her neck and generalized fatigue and weakness. She also had right lower extremity swelling. She had been ambulating with a walker but since onset of the symptoms she's become wheelchair-bound. There were no associated neuromuscular deficits with the pain.  When seen by Dr. Noemi Chapel she received 2 injections in the hips. A Doppler was performed which was negative for DVT.  The neck pain has resolved for the most part. She is now complaining of mid-left lumbosacral area pain.  Additionally her daughters noted a lesion over the lateral aspect of the left foot.  She does have a past medical history of polymyalgia rheumatica.   Labs reveal mild anemia with a hemoglobin 11.1/ hematocrit 33.0. Neutrophil count was mildly increased at 81% with a white count of 10,600. Glucose was 146. Total protein was minimally reduced at 5.9.  Review of Systems  She denies dysuria, pyuria, or hematuria. Urine done in Dr. Archie Endo office was negative.  She also denies fever chills, or sweats.     Objective:   Physical Exam  Significant or distinguishing  findings on physical exam are documented first.  Below that are other systems examined & findings.   She appears somewhat debilitated and fatigued.She is in a wheelchair.  She is upper or lower partials  An S4 is present with a grade 1 systolic murmur. She is an occasional premature beat.  There is slight tenting present.  There is tenderness to percussion of the flanks and lumbosacral area  Knees with fusiform changes; op scar on the left is well-healed  She is wearing over the knee support hose but has significant residual  edema.  Pedal pulses are equal but decreased.  She has a 3 x 2 mm blister on the lateral aspect of the left foot. There is no associated cellulitis.   General appearance:adequate nourishment w/o distress.  Eyes: No conjunctival inflammation or scleral icterus is present.  Oral exam: Tongue moist;lips and gums are healthy appearing.There is no oropharyngeal erythema or exudate noted.    Lungs:Chest clear to auscultation; no wheezes, rhonchi,rales ,or rubs present.No increased work of breathing.   Abdomen: bowel sounds normal, soft and non-tender without masses, organomegaly or hernias noted.  No guarding or rebound .   Skin:Warm & dry.  Intact without suspicious lesions or rashes ; no jaundice   Lymphatic: No lymphadenopathy is noted about the head, neck, axilla                Assessment & Plan:  #1 neck pain, essentially resolved  #2 lumbosacral pain which appears to be musculoskeletal. Urinalysis and is normal she has no genitourinary symptoms  #3 mild anemia  #4 minimal leukocytosis with no localizing signs  #5 blister left foot without associated purulence or cellulitis  Plan: See orders &  the after visit summary

## 2013-11-10 NOTE — Patient Instructions (Addendum)
Use an anti-inflammatory cream such as Aspercreme or Zostrix cream twice a day to the low back as needed. In lieu of this warm moist compresses or  hot water bottle can be used. Do not apply ice . Oral agents not option due to blood thinner.  Please monitor the blister on the lateral aspect of the left foot. Report evidence of cellulitis or purulence. Standard facility protocol can be employed for this lesion if available.  The white count is  elevated; oral steroids can do this but please report any signs or symptoms of infection such as fever, sputum production, nasal discharge,pain with urination , diarrhea, etc.

## 2013-11-10 NOTE — Progress Notes (Signed)
   Subjective:    Patient ID: Sheila Morgan, female    DOB: 08/04/1932, 80 y.o.   MRN: [id number]  HPI Pt had a L total knee replacement on 10/09/13 by Dr. Noemi Chapel. She went to SNF at [facility] where she remains currently.   Yesterday morning, pt woke up with stiff and painful neck, general fatigue and weakness. She also noted significant RLE swelling. Prior to yesterday the pt was mobile with a walker. She is now mainly wheelchair bound. Yesterday morning the pt denied any associated symptoms including facial drooping, confusion, fever, numbness or tingling in extremities, chest pain or SOB.   The pt was seen at Dr. Archie Endo office yesterday for surgery f/u. The pt informed Dr. Noemi Chapel of her symptoms. He gave her two "injections" in her hips, one of which she thinks was a steroid. She was sent for a RLE doppler, that was completed and showed no evidence of DVT.   Today she is complaining of continued neck stiffness and pain, though it has improved since seeing [facility] yesterday. She continues to have fatigue and deconditioning. The RLE swelling has not improved, despite knee high compression stockings. Additionally the pt's daughter has noted an ulcer on the lateral aspect of her L foot. The pt does have T2DM. Her last HgbA1c was 6.7% on 07/10/13.  Significantly the pt was recently treated for shingles while at SNF.  Review of Systems  Constitutional: Negative for fever and diaphoresis.  Respiratory: Negative for cough and shortness of breath.   Cardiovascular: Negative for chest pain and palpitations.  Genitourinary:       No loss of bladder or bowel  Neurological: Negative for numbness.       No tingling       Objective:   Physical Exam 3+ pitting edema in BLE, knee high TED stockings Bull[place] on lateral aspect of L foot      Assessment & Plan:

## 2013-11-10 NOTE — Progress Notes (Signed)
Pre visit review using our clinic review tool, if applicable. No additional management support is needed unless otherwise documented below in the visit note. 

## 2013-11-21 ENCOUNTER — Non-Acute Institutional Stay (SKILLED_NURSING_FACILITY): Payer: Medicare Other | Admitting: Adult Health

## 2013-11-21 DIAGNOSIS — M1710 Unilateral primary osteoarthritis, unspecified knee: Secondary | ICD-10-CM

## 2013-11-21 DIAGNOSIS — E1129 Type 2 diabetes mellitus with other diabetic kidney complication: Secondary | ICD-10-CM

## 2013-11-21 DIAGNOSIS — M353 Polymyalgia rheumatica: Secondary | ICD-10-CM

## 2013-11-21 DIAGNOSIS — I2510 Atherosclerotic heart disease of native coronary artery without angina pectoris: Secondary | ICD-10-CM | POA: Diagnosis not present

## 2013-11-21 DIAGNOSIS — M1712 Unilateral primary osteoarthritis, left knee: Secondary | ICD-10-CM

## 2013-11-21 DIAGNOSIS — I10 Essential (primary) hypertension: Secondary | ICD-10-CM

## 2013-11-21 DIAGNOSIS — E785 Hyperlipidemia, unspecified: Secondary | ICD-10-CM

## 2013-11-21 DIAGNOSIS — M109 Gout, unspecified: Secondary | ICD-10-CM

## 2013-11-22 ENCOUNTER — Encounter: Payer: Self-pay | Admitting: Adult Health

## 2013-11-22 ENCOUNTER — Telehealth: Payer: Self-pay | Admitting: *Deleted

## 2013-11-22 DIAGNOSIS — M1710 Unilateral primary osteoarthritis, unspecified knee: Secondary | ICD-10-CM | POA: Diagnosis not present

## 2013-11-22 DIAGNOSIS — Z96659 Presence of unspecified artificial knee joint: Secondary | ICD-10-CM | POA: Diagnosis not present

## 2013-11-22 DIAGNOSIS — L97809 Non-pressure chronic ulcer of other part of unspecified lower leg with unspecified severity: Secondary | ICD-10-CM | POA: Diagnosis not present

## 2013-11-22 DIAGNOSIS — I10 Essential (primary) hypertension: Secondary | ICD-10-CM | POA: Diagnosis not present

## 2013-11-22 DIAGNOSIS — L8995 Pressure ulcer of unspecified site, unstageable: Secondary | ICD-10-CM | POA: Diagnosis not present

## 2013-11-22 DIAGNOSIS — E119 Type 2 diabetes mellitus without complications: Secondary | ICD-10-CM | POA: Diagnosis not present

## 2013-11-22 DIAGNOSIS — Z471 Aftercare following joint replacement surgery: Secondary | ICD-10-CM | POA: Diagnosis not present

## 2013-11-22 NOTE — Telephone Encounter (Signed)
Left msg on triage stating pt was d/c from [facility] with instructions to take allopurinol. She was taking colchicine before. Daughter has been giving her both does she need to take both? Also she had orders to take xalreto but need to get authorize so daughter has been giving her a aspirin instead...Sheila Morgan

## 2013-11-22 NOTE — Telephone Encounter (Signed)
She can stay on Allopurinol but needs to check Uric acid after 8-10 weeks Daughter to verify with MD @ [facility] whether [place] was to be continued post Rehab

## 2013-11-22 NOTE — Progress Notes (Signed)
Patient ID: Sheila Morgan, female   DOB: 1932/10/12, 80 y.o.   MRN: [id number]             PROGRESS NOTE  DATE: 11/21/13  FACILITY: Nursing Home Location: [facility]  LEVEL OF CARE: SNF (31)  Acute Visit  CHIEF COMPLAINT:  Discharge Notes   HISTORY OF PRESENT ILLNESS:  This is a 80 year old female who is for discharge home with Home health PT, OT and Nursing. She has been admitted to [facility] on 10/12/13 on 10/12/13 from [facility] with Left Knee DJD S/P Left Total Knee Arthroplasty. Patient was admitted to this facility for short-term rehabilitation after the patient's recent hospitalization.  Patient has completed SNF rehabilitation and therapy has cleared the patient for discharge.   REASSESSMENT OF ONGOING PROBLEM(S):  HTN: Pt 's HTN remains stable.  Denies CP, sob, DOE, pedal edema, headaches, dizziness or visual disturbances.  No complications from the medications currently being used.  Last BP : 129/70  CAD: The angina has been stable. The patient denies dyspnea on exertion, orthopnea, pedal edema, palpitations and paroxysmal nocturnal dyspnea. No complications noted from the medication presently being used.  GOUT: Patien'st  gout remains stable. Patient denies joint pan, redness, swelling or warmth. No complications reported from the medications presently being used.   PAST MEDICAL HISTORY : Reviewed.  No changes/see problem list  CURRENT MEDICATIONS: Reviewed per MAR/see medication list  REVIEW OF SYSTEMS:  GENERAL: no change in appetite, no fatigue, no weight changes, no fever, chills or weakness RESPIRATORY: no cough, SOB, DOE, wheezing, hemoptysis CARDIAC: no chest pain, edema or palpitations GI: no abdominal pain, diarrhea, constipation, heart burn, nausea or vomiting  PHYSICAL EXAMINATION  GENERAL: no acute distress, normal body habitus NECK: supple, trachea midline, no neck masses, no thyroid tenderness, no thyromegaly RESPIRATORY:  breathing is even & unlabored, BS CTAB CARDIAC: RRR, no murmur,no extra heart sounds, no edema GI: abdomen soft, normal BS, no masses, no tenderness, no hepatomegaly, no splenomegaly EXTREMITIES: able to move all 4 extremities PSYCHIATRIC: the patient is alert & oriented to person, affect & behavior appropriate  LABS/RADIOLOGY: 11/14/13  uric acid 2.7 10/26/13  total protein 5.0 albumin 2.8  Glob 2.2  TBIL 1.0 DBIL 0.3  ALP 76  AST 14  ALT 15 10/18/13  WBC 9.4 hemoglobin 10.0 hematocrit 31.7 Labs reviewed: Basic Metabolic Panel:  Recent Labs  10/10/13 0440 10/11/13 0530 10/12/13 0550  NA 138 144 147  K 4.7 4.8 3.9  CL 100 108 107  CO2 21 24 29   GLUCOSE 146* 169* 99  BUN 19 25* 24*  CREATININE 0.99 0.92 0.83  CALCIUM 8.9 8.7 8.9   Liver Function Tests:  Recent Labs  12/03/12 1435 09/28/13 1555  AST 50* 43*  ALT 61* 71*  ALKPHOS 78 89  BILITOT 0.4 0.4  PROT 6.8 6.8  ALBUMIN 3.7 4.1   CBC:  Recent Labs  05/09/13 1555  09/28/13 1555 10/10/13 0440 10/11/13 0530 10/12/13 0850  WBC 8.6  < > 8.7 18.4* 14.3* 13.6*  NEUTROABS 6.9  --  6.5  --   --   --   HGB 14.4  < > 13.6 10.9* 8.7* 12.4  HCT 43.8  < > 39.7 32.4* 26.3* 36.2  MCV 93.6  < > 92.1 91.3 93.3 89.8  PLT 119*  < > 194 153 167 144*  < > = values in this interval not displayed.   Lipid Panel: No results found for this  basename: LDL, HDL, TRIGLYCERIDE, TOTALCHOL,  in the last 8760 hours  CBG:  Recent Labs  10/12/13 0631 10/12/13 0646 10/12/13 1111  GLUCAP 56* 100* 117*     ASSESSMENT/PLAN:  Left Knee DJD S/P Left Total Knee Arthroplasty - for home health PT, OT and Nursing Hyperlipidemia - continue Zocor Hypertension -  Well-controlled; continue Norvasc, Coreg, Imdur and Cozaar Polymyalgia Rheumatica - continue Prednisone Diabetes Mellitus, type 2 - well-controlled; continue Glucophage Gout - continue Allopurinol; Colchicine was recently discontinued CAD - stable   I have filled out  patient's discharge paperwork and written prescriptions.  Patient will receive home health PT, OT and Nursing.   Total discharge time: Less than 30 minutes  Discharge time involved coordination of the discharge process with Education officer, museum, nursing staff and therapy department. Medical justification for home health services verified.   CPT CODE: 19509  Seth Bake - NP [facility] [phone]

## 2013-11-23 DIAGNOSIS — L8995 Pressure ulcer of unspecified site, unstageable: Secondary | ICD-10-CM | POA: Diagnosis not present

## 2013-11-23 DIAGNOSIS — E119 Type 2 diabetes mellitus without complications: Secondary | ICD-10-CM | POA: Diagnosis not present

## 2013-11-23 DIAGNOSIS — I10 Essential (primary) hypertension: Secondary | ICD-10-CM | POA: Diagnosis not present

## 2013-11-23 DIAGNOSIS — Z96659 Presence of unspecified artificial knee joint: Secondary | ICD-10-CM | POA: Diagnosis not present

## 2013-11-23 DIAGNOSIS — Z471 Aftercare following joint replacement surgery: Secondary | ICD-10-CM | POA: Diagnosis not present

## 2013-11-23 DIAGNOSIS — L97809 Non-pressure chronic ulcer of other part of unspecified lower leg with unspecified severity: Secondary | ICD-10-CM | POA: Diagnosis not present

## 2013-11-23 NOTE — Telephone Encounter (Signed)
Called angela back no answer LMOM with md response...Johny Chess

## 2013-11-27 ENCOUNTER — Ambulatory Visit (INDEPENDENT_AMBULATORY_CARE_PROVIDER_SITE_OTHER): Payer: Medicare Other | Admitting: Internal Medicine

## 2013-11-27 ENCOUNTER — Encounter: Payer: Self-pay | Admitting: Internal Medicine

## 2013-11-27 VITALS — BP 140/80 | HR 79 | Temp 98.3°F | Wt 152.8 lb

## 2013-11-27 DIAGNOSIS — Z7901 Long term (current) use of anticoagulants: Secondary | ICD-10-CM | POA: Diagnosis not present

## 2013-11-27 DIAGNOSIS — Z299 Encounter for prophylactic measures, unspecified: Secondary | ICD-10-CM

## 2013-11-27 DIAGNOSIS — I2510 Atherosclerotic heart disease of native coronary artery without angina pectoris: Secondary | ICD-10-CM

## 2013-11-27 DIAGNOSIS — R609 Edema, unspecified: Secondary | ICD-10-CM | POA: Diagnosis not present

## 2013-11-27 MED ORDER — FUROSEMIDE 20 MG PO TABS: 20.0000 mg | ORAL_TABLET | Freq: Every day | ORAL | Status: DC

## 2013-11-27 NOTE — Progress Notes (Signed)
   Subjective:    Patient ID: Sheila Morgan, female    DOB: [birth year], 80 y.o.   MRN: [id number]  HPI The patient had a left total knee replacement 10/09/13; post discharge she was at [facility] for rehabilitation. She developed bilateral lower extremity edema while in rehabilitation. Doppler was negative for clots 11/09/13. She was given compression stockings. She has worn these until they "ripped". Also at that time renal function was normal.  At the time of discharge from rehabilitation 11/21/13 she was to be on Xarelto 10 mg daily until 7/31. There is a note from her daughter stating that the medical staff at [facility] was to obtain this preauthorization from the insurance company. As of today this has not occurred. She was to have taken this noval anticoagulant for at least 30 days total.      Review of Systems  She denies chest pain, palpitations, dyspnea, or hemoptysis  She has had loose bowel movements on 2 occasions over the last 4 days. She has no associated nausea or vomiting. She also denies any rectal bleeding or melena.  She has not had a bowel movement over the last 48 hours; she denies feeling abdominal discomfort or "constipated".  She denies fever, chills, cough, abdominal pain, hematuria or pyuria.     Objective:   Physical Exam  Positive for significant findings include: Fusiform changes of the left knee without definite effusion. She has tense edema of both lower extremities with 1/2+ pitting edema. Pedal pulses are equal but decreased.   General appearance is one of good health and nourishment w/o distress. Eyes: No conjunctival inflammation or scleral icterus is present. Oral exam:  lips and gums are healthy appearing.There is no oropharyngeal erythema or exudate noted.  Heart:  Normal rate and regular rhythm. S1 and S2 normal without gallop, murmur, click, rub or other extra sounds   Lungs:Chest clear to auscultation; no wheezes, rhonchi,rales ,or rubs  present.No increased work of breathing.  Abdomen: bowel sounds normal, soft and non-tender without masses, organomegaly or hernias noted.  No guarding or rebound . No tenderness over the flanks to percussion Musculoskeletal: strength & tone normal. Skin:Warm & dry.  Intact without suspicious lesions or rashes ; no jaundice or tenting. Lymphatic: No lymphadenopathy is noted about the head, neck, axilla               Assessment & Plan:  #1 edema; clinically deep venous thrombosis is not suggested.  Compression stocking will be reapplied. Additionally amlodipine is not felt to be the etiology as the edema of the edema is confined only to to the lower legs. Low-dose furosemide will be initiated.  #2 incomplete deep venous thrombosis prophylaxis; sample of Xarelto was provided as 20 mg one half pill daily dispense 5 (Lot:14 [id number].Exp 07/17)

## 2013-11-27 NOTE — Progress Notes (Signed)
   Subjective:    Patient ID: Sheila Morgan, female    DOB: Mar 27, 1933, 80 y.o.   MRN: [id number]  HPI Pt had L total knee replacement 10/2013. She developed subsequent BLE edema in rehab. She was seen in office 11/10/13 at which time she had pitting BLE edema. She had a negative RLE doppler on 11/09/13. She was given compression stockings. Her kidney function was normal at this time (results faxed by Dr. Archie Endo office).   Pt d/c from rehab 11/20/13. Her LLE edema has improved, though her RLE edema has persisted. She is no longer wearing the TED hose, as they have ripped. She is to start PT at home, but currently has reduced activity. Elevation does not improve the edema. Pt denies eating a high salt diet. The pt was recently seen by Dr. Noemi Chapel who suggested she may need a fluid pill. Pt is on amlodipine 5mg  daily.    Additionally pt's daughter questions whether or not to continue taking xarelto. Pt was started on xarelto 10mg  after her surgery. The pt states she believes she is out of the xarelto but can't remember. Per a note from the daughter, a doctor from rehab is currently talking to insurance company to get xarelto refilled.    Pt reports loose BM x 2 over the past 4 days. There is no N/V associated. The pt denies hematochezia or melena. The pt has not had a BM in 2 days, though she denies feeling constipated.  Review of Systems  Constitutional: Negative for fever and chills.  Respiratory: Negative for cough and shortness of breath.   Gastrointestinal: Negative for nausea, abdominal pain, constipation and blood in stool.  Genitourinary: Negative for hematuria.  Skin: Negative for rash.  Neurological: Negative for weakness and numbness.      Objective:   Physical Exam +2 RLE edema  +1 LLE edema  L lateral blister on foot with ecchymotic skin surrounding   L knee scar well healed       Assessment & Plan:  #1 edema; use TED stockings, consider repeating kidney function, albumin.    #2 loose bowel movements; probiotic

## 2013-11-27 NOTE — Progress Notes (Signed)
Pre visit review using our clinic review tool, if applicable. No additional management support is needed unless otherwise documented below in the visit note. 

## 2013-11-27 NOTE — Patient Instructions (Signed)
Xarelto 20 mg ONE HALF PILL for 10 days. Wear stockings when out of bed. Your ideal BP goal = AVERAGE < 135/85.Minimal goal is average < 140/90. Avoid ingestion of  excess salt/sodium.Cook with pepper & other spices . Use the salt substitute "No Salt" OR the Mrs Deliah Boston products to season food @ the table. Avoid foods which taste salty or "vinegary" as their sodium content will be high.

## 2013-11-28 DIAGNOSIS — E119 Type 2 diabetes mellitus without complications: Secondary | ICD-10-CM | POA: Diagnosis not present

## 2013-11-28 DIAGNOSIS — L97809 Non-pressure chronic ulcer of other part of unspecified lower leg with unspecified severity: Secondary | ICD-10-CM | POA: Diagnosis not present

## 2013-11-28 DIAGNOSIS — L8995 Pressure ulcer of unspecified site, unstageable: Secondary | ICD-10-CM | POA: Diagnosis not present

## 2013-11-28 DIAGNOSIS — I10 Essential (primary) hypertension: Secondary | ICD-10-CM | POA: Diagnosis not present

## 2013-11-28 DIAGNOSIS — Z96659 Presence of unspecified artificial knee joint: Secondary | ICD-10-CM | POA: Diagnosis not present

## 2013-11-28 DIAGNOSIS — Z471 Aftercare following joint replacement surgery: Secondary | ICD-10-CM | POA: Diagnosis not present

## 2013-11-29 ENCOUNTER — Telehealth: Payer: Self-pay | Admitting: *Deleted

## 2013-11-29 DIAGNOSIS — Z471 Aftercare following joint replacement surgery: Secondary | ICD-10-CM | POA: Diagnosis not present

## 2013-11-29 DIAGNOSIS — E119 Type 2 diabetes mellitus without complications: Secondary | ICD-10-CM | POA: Diagnosis not present

## 2013-11-29 DIAGNOSIS — Z96659 Presence of unspecified artificial knee joint: Secondary | ICD-10-CM | POA: Diagnosis not present

## 2013-11-29 DIAGNOSIS — I10 Essential (primary) hypertension: Secondary | ICD-10-CM | POA: Diagnosis not present

## 2013-11-29 DIAGNOSIS — L8995 Pressure ulcer of unspecified site, unstageable: Secondary | ICD-10-CM | POA: Diagnosis not present

## 2013-11-29 DIAGNOSIS — L97809 Non-pressure chronic ulcer of other part of unspecified lower leg with unspecified severity: Secondary | ICD-10-CM | POA: Diagnosis not present

## 2013-11-29 NOTE — Telephone Encounter (Signed)
Left msg on triage stating mom has both allupurinol & colchicine for her gout. She is not sure if she need to be taking both meds. Called daughter back LMOM  of md response on msg from 11/22/13. Pt is to stay off allopurinol & she need to have uric acid levels check anywhere 8-10 weeks...Sheila Morgan

## 2013-11-30 DIAGNOSIS — L97809 Non-pressure chronic ulcer of other part of unspecified lower leg with unspecified severity: Secondary | ICD-10-CM | POA: Diagnosis not present

## 2013-11-30 DIAGNOSIS — Z471 Aftercare following joint replacement surgery: Secondary | ICD-10-CM | POA: Diagnosis not present

## 2013-11-30 DIAGNOSIS — E119 Type 2 diabetes mellitus without complications: Secondary | ICD-10-CM | POA: Diagnosis not present

## 2013-11-30 DIAGNOSIS — I10 Essential (primary) hypertension: Secondary | ICD-10-CM | POA: Diagnosis not present

## 2013-11-30 DIAGNOSIS — Z96659 Presence of unspecified artificial knee joint: Secondary | ICD-10-CM | POA: Diagnosis not present

## 2013-11-30 DIAGNOSIS — L8995 Pressure ulcer of unspecified site, unstageable: Secondary | ICD-10-CM | POA: Diagnosis not present

## 2013-12-01 DIAGNOSIS — Z471 Aftercare following joint replacement surgery: Secondary | ICD-10-CM | POA: Diagnosis not present

## 2013-12-01 DIAGNOSIS — L97809 Non-pressure chronic ulcer of other part of unspecified lower leg with unspecified severity: Secondary | ICD-10-CM | POA: Diagnosis not present

## 2013-12-01 DIAGNOSIS — Z96659 Presence of unspecified artificial knee joint: Secondary | ICD-10-CM | POA: Diagnosis not present

## 2013-12-01 DIAGNOSIS — L8995 Pressure ulcer of unspecified site, unstageable: Secondary | ICD-10-CM | POA: Diagnosis not present

## 2013-12-01 DIAGNOSIS — E119 Type 2 diabetes mellitus without complications: Secondary | ICD-10-CM | POA: Diagnosis not present

## 2013-12-01 DIAGNOSIS — I10 Essential (primary) hypertension: Secondary | ICD-10-CM | POA: Diagnosis not present

## 2013-12-02 ENCOUNTER — Other Ambulatory Visit: Payer: Self-pay | Admitting: Internal Medicine

## 2013-12-04 ENCOUNTER — Telehealth: Payer: Self-pay | Admitting: Internal Medicine

## 2013-12-04 DIAGNOSIS — L97809 Non-pressure chronic ulcer of other part of unspecified lower leg with unspecified severity: Secondary | ICD-10-CM | POA: Diagnosis not present

## 2013-12-04 DIAGNOSIS — I10 Essential (primary) hypertension: Secondary | ICD-10-CM | POA: Diagnosis not present

## 2013-12-04 DIAGNOSIS — Z96659 Presence of unspecified artificial knee joint: Secondary | ICD-10-CM | POA: Diagnosis not present

## 2013-12-04 DIAGNOSIS — E119 Type 2 diabetes mellitus without complications: Secondary | ICD-10-CM | POA: Diagnosis not present

## 2013-12-04 DIAGNOSIS — L8995 Pressure ulcer of unspecified site, unstageable: Secondary | ICD-10-CM | POA: Diagnosis not present

## 2013-12-04 DIAGNOSIS — Z471 Aftercare following joint replacement surgery: Secondary | ICD-10-CM | POA: Diagnosis not present

## 2013-12-04 NOTE — Telephone Encounter (Signed)
Notified pt with md response.../lmb 

## 2013-12-04 NOTE — Telephone Encounter (Signed)
Sheila Morgan with [facility] is calling to request a verbal wound care order for the patient's pressure/diabetic ulcer on the outside of her left foot. They want to clean it with wound cleanser, dress it with hydrogel and cover it with Bethann Punches can be reached back with a verbal at Ph#: [phone].

## 2013-12-04 NOTE — Telephone Encounter (Signed)
Verbal order OK as written

## 2013-12-05 DIAGNOSIS — L97809 Non-pressure chronic ulcer of other part of unspecified lower leg with unspecified severity: Secondary | ICD-10-CM | POA: Diagnosis not present

## 2013-12-05 DIAGNOSIS — Z471 Aftercare following joint replacement surgery: Secondary | ICD-10-CM | POA: Diagnosis not present

## 2013-12-05 DIAGNOSIS — E119 Type 2 diabetes mellitus without complications: Secondary | ICD-10-CM | POA: Diagnosis not present

## 2013-12-05 DIAGNOSIS — Z96659 Presence of unspecified artificial knee joint: Secondary | ICD-10-CM | POA: Diagnosis not present

## 2013-12-05 DIAGNOSIS — I10 Essential (primary) hypertension: Secondary | ICD-10-CM | POA: Diagnosis not present

## 2013-12-05 DIAGNOSIS — L8995 Pressure ulcer of unspecified site, unstageable: Secondary | ICD-10-CM | POA: Diagnosis not present

## 2013-12-06 DIAGNOSIS — Z96659 Presence of unspecified artificial knee joint: Secondary | ICD-10-CM | POA: Diagnosis not present

## 2013-12-06 DIAGNOSIS — Z471 Aftercare following joint replacement surgery: Secondary | ICD-10-CM | POA: Diagnosis not present

## 2013-12-06 DIAGNOSIS — L8995 Pressure ulcer of unspecified site, unstageable: Secondary | ICD-10-CM | POA: Diagnosis not present

## 2013-12-06 DIAGNOSIS — I10 Essential (primary) hypertension: Secondary | ICD-10-CM | POA: Diagnosis not present

## 2013-12-06 DIAGNOSIS — E119 Type 2 diabetes mellitus without complications: Secondary | ICD-10-CM | POA: Diagnosis not present

## 2013-12-06 DIAGNOSIS — L97809 Non-pressure chronic ulcer of other part of unspecified lower leg with unspecified severity: Secondary | ICD-10-CM | POA: Diagnosis not present

## 2013-12-07 DIAGNOSIS — L97809 Non-pressure chronic ulcer of other part of unspecified lower leg with unspecified severity: Secondary | ICD-10-CM | POA: Diagnosis not present

## 2013-12-07 DIAGNOSIS — Z96659 Presence of unspecified artificial knee joint: Secondary | ICD-10-CM | POA: Diagnosis not present

## 2013-12-07 DIAGNOSIS — M25579 Pain in unspecified ankle and joints of unspecified foot: Secondary | ICD-10-CM | POA: Diagnosis not present

## 2013-12-07 DIAGNOSIS — I10 Essential (primary) hypertension: Secondary | ICD-10-CM | POA: Diagnosis not present

## 2013-12-07 DIAGNOSIS — L8995 Pressure ulcer of unspecified site, unstageable: Secondary | ICD-10-CM | POA: Diagnosis not present

## 2013-12-07 DIAGNOSIS — E119 Type 2 diabetes mellitus without complications: Secondary | ICD-10-CM | POA: Diagnosis not present

## 2013-12-07 DIAGNOSIS — S91309A Unspecified open wound, unspecified foot, initial encounter: Secondary | ICD-10-CM | POA: Diagnosis not present

## 2013-12-07 DIAGNOSIS — Z471 Aftercare following joint replacement surgery: Secondary | ICD-10-CM | POA: Diagnosis not present

## 2013-12-11 DIAGNOSIS — L97809 Non-pressure chronic ulcer of other part of unspecified lower leg with unspecified severity: Secondary | ICD-10-CM | POA: Diagnosis not present

## 2013-12-11 DIAGNOSIS — Z471 Aftercare following joint replacement surgery: Secondary | ICD-10-CM | POA: Diagnosis not present

## 2013-12-11 DIAGNOSIS — E119 Type 2 diabetes mellitus without complications: Secondary | ICD-10-CM | POA: Diagnosis not present

## 2013-12-11 DIAGNOSIS — Z96659 Presence of unspecified artificial knee joint: Secondary | ICD-10-CM | POA: Diagnosis not present

## 2013-12-11 DIAGNOSIS — I10 Essential (primary) hypertension: Secondary | ICD-10-CM | POA: Diagnosis not present

## 2013-12-11 DIAGNOSIS — L8995 Pressure ulcer of unspecified site, unstageable: Secondary | ICD-10-CM | POA: Diagnosis not present

## 2013-12-12 DIAGNOSIS — Z96659 Presence of unspecified artificial knee joint: Secondary | ICD-10-CM | POA: Diagnosis not present

## 2013-12-12 DIAGNOSIS — I10 Essential (primary) hypertension: Secondary | ICD-10-CM | POA: Diagnosis not present

## 2013-12-12 DIAGNOSIS — E119 Type 2 diabetes mellitus without complications: Secondary | ICD-10-CM | POA: Diagnosis not present

## 2013-12-12 DIAGNOSIS — L8995 Pressure ulcer of unspecified site, unstageable: Secondary | ICD-10-CM | POA: Diagnosis not present

## 2013-12-12 DIAGNOSIS — Z471 Aftercare following joint replacement surgery: Secondary | ICD-10-CM | POA: Diagnosis not present

## 2013-12-12 DIAGNOSIS — L97809 Non-pressure chronic ulcer of other part of unspecified lower leg with unspecified severity: Secondary | ICD-10-CM | POA: Diagnosis not present

## 2013-12-13 DIAGNOSIS — Z96659 Presence of unspecified artificial knee joint: Secondary | ICD-10-CM | POA: Diagnosis not present

## 2013-12-13 DIAGNOSIS — L97809 Non-pressure chronic ulcer of other part of unspecified lower leg with unspecified severity: Secondary | ICD-10-CM | POA: Diagnosis not present

## 2013-12-13 DIAGNOSIS — L8995 Pressure ulcer of unspecified site, unstageable: Secondary | ICD-10-CM | POA: Diagnosis not present

## 2013-12-13 DIAGNOSIS — Z471 Aftercare following joint replacement surgery: Secondary | ICD-10-CM | POA: Diagnosis not present

## 2013-12-13 DIAGNOSIS — E119 Type 2 diabetes mellitus without complications: Secondary | ICD-10-CM | POA: Diagnosis not present

## 2013-12-13 DIAGNOSIS — I10 Essential (primary) hypertension: Secondary | ICD-10-CM | POA: Diagnosis not present

## 2013-12-14 DIAGNOSIS — I10 Essential (primary) hypertension: Secondary | ICD-10-CM | POA: Diagnosis not present

## 2013-12-14 DIAGNOSIS — L8995 Pressure ulcer of unspecified site, unstageable: Secondary | ICD-10-CM | POA: Diagnosis not present

## 2013-12-14 DIAGNOSIS — E119 Type 2 diabetes mellitus without complications: Secondary | ICD-10-CM | POA: Diagnosis not present

## 2013-12-14 DIAGNOSIS — Z471 Aftercare following joint replacement surgery: Secondary | ICD-10-CM | POA: Diagnosis not present

## 2013-12-14 DIAGNOSIS — L97809 Non-pressure chronic ulcer of other part of unspecified lower leg with unspecified severity: Secondary | ICD-10-CM | POA: Diagnosis not present

## 2013-12-14 DIAGNOSIS — Z96659 Presence of unspecified artificial knee joint: Secondary | ICD-10-CM | POA: Diagnosis not present

## 2013-12-15 DIAGNOSIS — L97409 Non-pressure chronic ulcer of unspecified heel and midfoot with unspecified severity: Secondary | ICD-10-CM | POA: Diagnosis not present

## 2013-12-18 DIAGNOSIS — E119 Type 2 diabetes mellitus without complications: Secondary | ICD-10-CM | POA: Diagnosis not present

## 2013-12-18 DIAGNOSIS — L8995 Pressure ulcer of unspecified site, unstageable: Secondary | ICD-10-CM | POA: Diagnosis not present

## 2013-12-18 DIAGNOSIS — Z471 Aftercare following joint replacement surgery: Secondary | ICD-10-CM | POA: Diagnosis not present

## 2013-12-18 DIAGNOSIS — I10 Essential (primary) hypertension: Secondary | ICD-10-CM | POA: Diagnosis not present

## 2013-12-18 DIAGNOSIS — Z96659 Presence of unspecified artificial knee joint: Secondary | ICD-10-CM | POA: Diagnosis not present

## 2013-12-18 DIAGNOSIS — L97809 Non-pressure chronic ulcer of other part of unspecified lower leg with unspecified severity: Secondary | ICD-10-CM | POA: Diagnosis not present

## 2013-12-19 DIAGNOSIS — E119 Type 2 diabetes mellitus without complications: Secondary | ICD-10-CM | POA: Diagnosis not present

## 2013-12-19 DIAGNOSIS — L97809 Non-pressure chronic ulcer of other part of unspecified lower leg with unspecified severity: Secondary | ICD-10-CM | POA: Diagnosis not present

## 2013-12-19 DIAGNOSIS — Z471 Aftercare following joint replacement surgery: Secondary | ICD-10-CM | POA: Diagnosis not present

## 2013-12-19 DIAGNOSIS — Z96659 Presence of unspecified artificial knee joint: Secondary | ICD-10-CM | POA: Diagnosis not present

## 2013-12-19 DIAGNOSIS — L8995 Pressure ulcer of unspecified site, unstageable: Secondary | ICD-10-CM | POA: Diagnosis not present

## 2013-12-19 DIAGNOSIS — I10 Essential (primary) hypertension: Secondary | ICD-10-CM | POA: Diagnosis not present

## 2013-12-21 DIAGNOSIS — Z96659 Presence of unspecified artificial knee joint: Secondary | ICD-10-CM | POA: Diagnosis not present

## 2013-12-21 DIAGNOSIS — L97809 Non-pressure chronic ulcer of other part of unspecified lower leg with unspecified severity: Secondary | ICD-10-CM | POA: Diagnosis not present

## 2013-12-21 DIAGNOSIS — E119 Type 2 diabetes mellitus without complications: Secondary | ICD-10-CM | POA: Diagnosis not present

## 2013-12-21 DIAGNOSIS — Z471 Aftercare following joint replacement surgery: Secondary | ICD-10-CM | POA: Diagnosis not present

## 2013-12-21 DIAGNOSIS — I10 Essential (primary) hypertension: Secondary | ICD-10-CM | POA: Diagnosis not present

## 2013-12-21 DIAGNOSIS — L8995 Pressure ulcer of unspecified site, unstageable: Secondary | ICD-10-CM | POA: Diagnosis not present

## 2013-12-22 DIAGNOSIS — L03039 Cellulitis of unspecified toe: Secondary | ICD-10-CM | POA: Diagnosis not present

## 2013-12-24 ENCOUNTER — Other Ambulatory Visit: Payer: Self-pay | Admitting: Nurse Practitioner

## 2013-12-25 ENCOUNTER — Other Ambulatory Visit: Payer: Self-pay | Admitting: Cardiovascular Disease

## 2013-12-25 DIAGNOSIS — L8995 Pressure ulcer of unspecified site, unstageable: Secondary | ICD-10-CM | POA: Diagnosis not present

## 2013-12-25 DIAGNOSIS — E119 Type 2 diabetes mellitus without complications: Secondary | ICD-10-CM | POA: Diagnosis not present

## 2013-12-25 DIAGNOSIS — Z471 Aftercare following joint replacement surgery: Secondary | ICD-10-CM | POA: Diagnosis not present

## 2013-12-25 DIAGNOSIS — I10 Essential (primary) hypertension: Secondary | ICD-10-CM | POA: Diagnosis not present

## 2013-12-25 DIAGNOSIS — Z96659 Presence of unspecified artificial knee joint: Secondary | ICD-10-CM | POA: Diagnosis not present

## 2013-12-25 DIAGNOSIS — L97809 Non-pressure chronic ulcer of other part of unspecified lower leg with unspecified severity: Secondary | ICD-10-CM | POA: Diagnosis not present

## 2013-12-26 NOTE — Telephone Encounter (Signed)
Rx was sent to pharmacy electronically. 

## 2013-12-27 DIAGNOSIS — E119 Type 2 diabetes mellitus without complications: Secondary | ICD-10-CM | POA: Diagnosis not present

## 2013-12-27 DIAGNOSIS — Z96659 Presence of unspecified artificial knee joint: Secondary | ICD-10-CM | POA: Diagnosis not present

## 2013-12-27 DIAGNOSIS — I10 Essential (primary) hypertension: Secondary | ICD-10-CM | POA: Diagnosis not present

## 2013-12-27 DIAGNOSIS — L8995 Pressure ulcer of unspecified site, unstageable: Secondary | ICD-10-CM | POA: Diagnosis not present

## 2013-12-27 DIAGNOSIS — Z471 Aftercare following joint replacement surgery: Secondary | ICD-10-CM | POA: Diagnosis not present

## 2013-12-27 DIAGNOSIS — L97809 Non-pressure chronic ulcer of other part of unspecified lower leg with unspecified severity: Secondary | ICD-10-CM | POA: Diagnosis not present

## 2014-01-01 DIAGNOSIS — E119 Type 2 diabetes mellitus without complications: Secondary | ICD-10-CM | POA: Diagnosis not present

## 2014-01-01 DIAGNOSIS — L97809 Non-pressure chronic ulcer of other part of unspecified lower leg with unspecified severity: Secondary | ICD-10-CM | POA: Diagnosis not present

## 2014-01-01 DIAGNOSIS — I10 Essential (primary) hypertension: Secondary | ICD-10-CM | POA: Diagnosis not present

## 2014-01-01 DIAGNOSIS — L8995 Pressure ulcer of unspecified site, unstageable: Secondary | ICD-10-CM | POA: Diagnosis not present

## 2014-01-01 DIAGNOSIS — Z96659 Presence of unspecified artificial knee joint: Secondary | ICD-10-CM | POA: Diagnosis not present

## 2014-01-01 DIAGNOSIS — Z471 Aftercare following joint replacement surgery: Secondary | ICD-10-CM | POA: Diagnosis not present

## 2014-01-02 DIAGNOSIS — L8995 Pressure ulcer of unspecified site, unstageable: Secondary | ICD-10-CM | POA: Diagnosis not present

## 2014-01-02 DIAGNOSIS — E119 Type 2 diabetes mellitus without complications: Secondary | ICD-10-CM | POA: Diagnosis not present

## 2014-01-02 DIAGNOSIS — I10 Essential (primary) hypertension: Secondary | ICD-10-CM | POA: Diagnosis not present

## 2014-01-02 DIAGNOSIS — Z96659 Presence of unspecified artificial knee joint: Secondary | ICD-10-CM | POA: Diagnosis not present

## 2014-01-02 DIAGNOSIS — L97809 Non-pressure chronic ulcer of other part of unspecified lower leg with unspecified severity: Secondary | ICD-10-CM | POA: Diagnosis not present

## 2014-01-02 DIAGNOSIS — Z471 Aftercare following joint replacement surgery: Secondary | ICD-10-CM | POA: Diagnosis not present

## 2014-01-04 DIAGNOSIS — Z471 Aftercare following joint replacement surgery: Secondary | ICD-10-CM | POA: Diagnosis not present

## 2014-01-04 DIAGNOSIS — E119 Type 2 diabetes mellitus without complications: Secondary | ICD-10-CM | POA: Diagnosis not present

## 2014-01-04 DIAGNOSIS — L8995 Pressure ulcer of unspecified site, unstageable: Secondary | ICD-10-CM | POA: Diagnosis not present

## 2014-01-04 DIAGNOSIS — I10 Essential (primary) hypertension: Secondary | ICD-10-CM | POA: Diagnosis not present

## 2014-01-04 DIAGNOSIS — Z96659 Presence of unspecified artificial knee joint: Secondary | ICD-10-CM | POA: Diagnosis not present

## 2014-01-04 DIAGNOSIS — L97809 Non-pressure chronic ulcer of other part of unspecified lower leg with unspecified severity: Secondary | ICD-10-CM | POA: Diagnosis not present

## 2014-01-09 DIAGNOSIS — E119 Type 2 diabetes mellitus without complications: Secondary | ICD-10-CM | POA: Diagnosis not present

## 2014-01-09 DIAGNOSIS — I10 Essential (primary) hypertension: Secondary | ICD-10-CM | POA: Diagnosis not present

## 2014-01-09 DIAGNOSIS — Z471 Aftercare following joint replacement surgery: Secondary | ICD-10-CM | POA: Diagnosis not present

## 2014-01-09 DIAGNOSIS — L97809 Non-pressure chronic ulcer of other part of unspecified lower leg with unspecified severity: Secondary | ICD-10-CM | POA: Diagnosis not present

## 2014-01-09 DIAGNOSIS — L8995 Pressure ulcer of unspecified site, unstageable: Secondary | ICD-10-CM | POA: Diagnosis not present

## 2014-01-09 DIAGNOSIS — Z96659 Presence of unspecified artificial knee joint: Secondary | ICD-10-CM | POA: Diagnosis not present

## 2014-01-10 DIAGNOSIS — L8995 Pressure ulcer of unspecified site, unstageable: Secondary | ICD-10-CM | POA: Diagnosis not present

## 2014-01-10 DIAGNOSIS — I10 Essential (primary) hypertension: Secondary | ICD-10-CM | POA: Diagnosis not present

## 2014-01-10 DIAGNOSIS — E119 Type 2 diabetes mellitus without complications: Secondary | ICD-10-CM | POA: Diagnosis not present

## 2014-01-10 DIAGNOSIS — L97809 Non-pressure chronic ulcer of other part of unspecified lower leg with unspecified severity: Secondary | ICD-10-CM | POA: Diagnosis not present

## 2014-01-10 DIAGNOSIS — Z96659 Presence of unspecified artificial knee joint: Secondary | ICD-10-CM | POA: Diagnosis not present

## 2014-01-10 DIAGNOSIS — Z471 Aftercare following joint replacement surgery: Secondary | ICD-10-CM | POA: Diagnosis not present

## 2014-01-12 DIAGNOSIS — I10 Essential (primary) hypertension: Secondary | ICD-10-CM | POA: Diagnosis not present

## 2014-01-12 DIAGNOSIS — Z96659 Presence of unspecified artificial knee joint: Secondary | ICD-10-CM | POA: Diagnosis not present

## 2014-01-12 DIAGNOSIS — L97809 Non-pressure chronic ulcer of other part of unspecified lower leg with unspecified severity: Secondary | ICD-10-CM | POA: Diagnosis not present

## 2014-01-12 DIAGNOSIS — E119 Type 2 diabetes mellitus without complications: Secondary | ICD-10-CM | POA: Diagnosis not present

## 2014-01-12 DIAGNOSIS — Z471 Aftercare following joint replacement surgery: Secondary | ICD-10-CM | POA: Diagnosis not present

## 2014-01-12 DIAGNOSIS — L8995 Pressure ulcer of unspecified site, unstageable: Secondary | ICD-10-CM | POA: Diagnosis not present

## 2014-01-13 ENCOUNTER — Other Ambulatory Visit: Payer: Self-pay | Admitting: Internal Medicine

## 2014-01-16 DIAGNOSIS — Z96659 Presence of unspecified artificial knee joint: Secondary | ICD-10-CM | POA: Diagnosis not present

## 2014-01-16 DIAGNOSIS — Z471 Aftercare following joint replacement surgery: Secondary | ICD-10-CM | POA: Diagnosis not present

## 2014-01-16 DIAGNOSIS — L8995 Pressure ulcer of unspecified site, unstageable: Secondary | ICD-10-CM | POA: Diagnosis not present

## 2014-01-16 DIAGNOSIS — I10 Essential (primary) hypertension: Secondary | ICD-10-CM | POA: Diagnosis not present

## 2014-01-16 DIAGNOSIS — L97809 Non-pressure chronic ulcer of other part of unspecified lower leg with unspecified severity: Secondary | ICD-10-CM | POA: Diagnosis not present

## 2014-01-16 DIAGNOSIS — E119 Type 2 diabetes mellitus without complications: Secondary | ICD-10-CM | POA: Diagnosis not present

## 2014-01-21 DIAGNOSIS — M810 Age-related osteoporosis without current pathological fracture: Secondary | ICD-10-CM | POA: Diagnosis not present

## 2014-01-21 DIAGNOSIS — I2510 Atherosclerotic heart disease of native coronary artery without angina pectoris: Secondary | ICD-10-CM | POA: Diagnosis not present

## 2014-01-21 DIAGNOSIS — E119 Type 2 diabetes mellitus without complications: Secondary | ICD-10-CM | POA: Diagnosis not present

## 2014-01-21 DIAGNOSIS — L8992 Pressure ulcer of unspecified site, stage 2: Secondary | ICD-10-CM | POA: Diagnosis not present

## 2014-01-21 DIAGNOSIS — L89899 Pressure ulcer of other site, unspecified stage: Secondary | ICD-10-CM | POA: Diagnosis not present

## 2014-01-21 DIAGNOSIS — I10 Essential (primary) hypertension: Secondary | ICD-10-CM | POA: Diagnosis not present

## 2014-01-23 DIAGNOSIS — Z96659 Presence of unspecified artificial knee joint: Secondary | ICD-10-CM | POA: Diagnosis not present

## 2014-01-23 DIAGNOSIS — Z471 Aftercare following joint replacement surgery: Secondary | ICD-10-CM | POA: Diagnosis not present

## 2014-01-26 DIAGNOSIS — E119 Type 2 diabetes mellitus without complications: Secondary | ICD-10-CM | POA: Diagnosis not present

## 2014-01-26 DIAGNOSIS — L8992 Pressure ulcer of unspecified site, stage 2: Secondary | ICD-10-CM | POA: Diagnosis not present

## 2014-01-26 DIAGNOSIS — M810 Age-related osteoporosis without current pathological fracture: Secondary | ICD-10-CM | POA: Diagnosis not present

## 2014-01-26 DIAGNOSIS — I2510 Atherosclerotic heart disease of native coronary artery without angina pectoris: Secondary | ICD-10-CM | POA: Diagnosis not present

## 2014-01-26 DIAGNOSIS — L89899 Pressure ulcer of other site, unspecified stage: Secondary | ICD-10-CM | POA: Diagnosis not present

## 2014-01-26 DIAGNOSIS — I10 Essential (primary) hypertension: Secondary | ICD-10-CM | POA: Diagnosis not present

## 2014-01-27 ENCOUNTER — Other Ambulatory Visit: Payer: Self-pay | Admitting: Internal Medicine

## 2014-01-31 DIAGNOSIS — M810 Age-related osteoporosis without current pathological fracture: Secondary | ICD-10-CM | POA: Diagnosis not present

## 2014-01-31 DIAGNOSIS — L89899 Pressure ulcer of other site, unspecified stage: Secondary | ICD-10-CM | POA: Diagnosis not present

## 2014-01-31 DIAGNOSIS — I10 Essential (primary) hypertension: Secondary | ICD-10-CM | POA: Diagnosis not present

## 2014-01-31 DIAGNOSIS — E119 Type 2 diabetes mellitus without complications: Secondary | ICD-10-CM | POA: Diagnosis not present

## 2014-01-31 DIAGNOSIS — I2510 Atherosclerotic heart disease of native coronary artery without angina pectoris: Secondary | ICD-10-CM | POA: Diagnosis not present

## 2014-01-31 DIAGNOSIS — L8992 Pressure ulcer of unspecified site, stage 2: Secondary | ICD-10-CM | POA: Diagnosis not present

## 2014-02-05 DIAGNOSIS — Z96659 Presence of unspecified artificial knee joint: Secondary | ICD-10-CM | POA: Diagnosis not present

## 2014-02-05 DIAGNOSIS — M1712 Unilateral primary osteoarthritis, left knee: Secondary | ICD-10-CM | POA: Diagnosis not present

## 2014-02-05 DIAGNOSIS — R531 Weakness: Secondary | ICD-10-CM | POA: Diagnosis not present

## 2014-02-07 DIAGNOSIS — M1712 Unilateral primary osteoarthritis, left knee: Secondary | ICD-10-CM | POA: Diagnosis not present

## 2014-02-07 DIAGNOSIS — Z96659 Presence of unspecified artificial knee joint: Secondary | ICD-10-CM | POA: Diagnosis not present

## 2014-02-07 DIAGNOSIS — R531 Weakness: Secondary | ICD-10-CM | POA: Diagnosis not present

## 2014-02-08 ENCOUNTER — Ambulatory Visit: Payer: Medicare Other | Admitting: Internal Medicine

## 2014-02-12 ENCOUNTER — Encounter: Payer: Self-pay | Admitting: Internal Medicine

## 2014-02-12 ENCOUNTER — Ambulatory Visit (INDEPENDENT_AMBULATORY_CARE_PROVIDER_SITE_OTHER): Payer: Medicare Other | Admitting: Internal Medicine

## 2014-02-12 ENCOUNTER — Other Ambulatory Visit (INDEPENDENT_AMBULATORY_CARE_PROVIDER_SITE_OTHER): Payer: Medicare Other

## 2014-02-12 VITALS — BP 150/82 | HR 78 | Temp 97.7°F | Wt 141.5 lb

## 2014-02-12 DIAGNOSIS — E785 Hyperlipidemia, unspecified: Secondary | ICD-10-CM

## 2014-02-12 DIAGNOSIS — R634 Abnormal weight loss: Secondary | ICD-10-CM | POA: Insufficient documentation

## 2014-02-12 DIAGNOSIS — E1129 Type 2 diabetes mellitus with other diabetic kidney complication: Secondary | ICD-10-CM

## 2014-02-12 DIAGNOSIS — E1121 Type 2 diabetes mellitus with diabetic nephropathy: Secondary | ICD-10-CM

## 2014-02-12 DIAGNOSIS — Z23 Encounter for immunization: Secondary | ICD-10-CM

## 2014-02-12 DIAGNOSIS — L8992 Pressure ulcer of unspecified site, stage 2: Secondary | ICD-10-CM | POA: Insufficient documentation

## 2014-02-12 DIAGNOSIS — L89892 Pressure ulcer of other site, stage 2: Secondary | ICD-10-CM

## 2014-02-12 DIAGNOSIS — I2510 Atherosclerotic heart disease of native coronary artery without angina pectoris: Secondary | ICD-10-CM | POA: Diagnosis not present

## 2014-02-12 LAB — HEPATIC FUNCTION PANEL
ALK PHOS: 72 U/L (ref 39–117)
ALT: 25 U/L (ref 0–35)
AST: 28 U/L (ref 0–37)
Albumin: 3.5 g/dL (ref 3.5–5.2)
BILIRUBIN DIRECT: 0.1 mg/dL (ref 0.0–0.3)
BILIRUBIN TOTAL: 0.5 mg/dL (ref 0.2–1.2)
TOTAL PROTEIN: 7.2 g/dL (ref 6.0–8.3)

## 2014-02-12 LAB — MICROALBUMIN / CREATININE URINE RATIO
CREATININE, U: 93.1 mg/dL
Microalb Creat Ratio: 1.7 mg/g (ref 0.0–30.0)
Microalb, Ur: 1.6 mg/dL (ref 0.0–1.9)

## 2014-02-12 LAB — CBC WITH DIFFERENTIAL/PLATELET
Basophils Absolute: 0 10*3/uL (ref 0.0–0.1)
Basophils Relative: 0.3 % (ref 0.0–3.0)
EOS ABS: 0.1 10*3/uL (ref 0.0–0.7)
EOS PCT: 1 % (ref 0.0–5.0)
HEMATOCRIT: 41.8 % (ref 36.0–46.0)
Hemoglobin: 13.6 g/dL (ref 12.0–15.0)
LYMPHS ABS: 1.8 10*3/uL (ref 0.7–4.0)
Lymphocytes Relative: 20.7 % (ref 12.0–46.0)
MCHC: 32.5 g/dL (ref 30.0–36.0)
MCV: 91 fl (ref 78.0–100.0)
MONO ABS: 0.9 10*3/uL (ref 0.1–1.0)
Monocytes Relative: 9.9 % (ref 3.0–12.0)
NEUTROS PCT: 68.1 % (ref 43.0–77.0)
Neutro Abs: 5.9 10*3/uL (ref 1.4–7.7)
Platelets: 174 10*3/uL (ref 150.0–400.0)
RBC: 4.6 Mil/uL (ref 3.87–5.11)
RDW: 17.4 % — ABNORMAL HIGH (ref 11.5–15.5)
WBC: 8.6 10*3/uL (ref 4.0–10.5)

## 2014-02-12 LAB — TSH: TSH: 4.52 u[IU]/mL — ABNORMAL HIGH (ref 0.35–4.50)

## 2014-02-12 LAB — LIPID PANEL
CHOLESTEROL: 150 mg/dL (ref 0–200)
HDL: 65.3 mg/dL (ref >39.00)
LDL Cholesterol: 64 mg/dL (ref 0–99)
NonHDL: 84.7
TRIGLYCERIDES: 104 mg/dL (ref 0.0–149.0)
Total CHOL/HDL Ratio: 2
VLDL: 20.8 mg/dL (ref 0.0–40.0)

## 2014-02-12 LAB — HEMOGLOBIN A1C: HEMOGLOBIN A1C: 6.4 % (ref 4.6–6.5)

## 2014-02-12 NOTE — Patient Instructions (Signed)
Your next office appointment will be determined based upon review of your pending labs . Those instructions will be transmitted to you  by mail Followup as needed for your acute issue. Please report any significant change in your symptoms.   

## 2014-02-12 NOTE — Progress Notes (Signed)
   Subjective:    Patient ID: Sheila Morgan, female    DOB: 1932/09/11, 80 y.o.   MRN: [id number]  Diabetes Pertinent negatives for hypoglycemia include no headaches. Pertinent negatives for diabetes include no chest pain, no polydipsia, no polyphagia, no polyuria and no weakness.    Patient is seen today for follow-up.  She is recovering from left TKR that was done in June by Dr Noemi Chapel.   Diabetes - FBS - 80-90's 2 hour post prandial blood sugars - not checking No symptoms of hypoglycemia She is not able to exercise due to limitations with knee Eye exam - May of 2015, no diabetic changes Podiatry - never seen  Hypertension - Home blood pressure readings - 130/70's Reports compliance with current medicines; no adverse effects No CV symptoms  Hyperlipidemia - Followed by Dr Gwenlyn Found  She and her daughter are concerned about weight loss.  She has lost 20 pounds since May of this year.  She states that her appetite is decreased.  She is using Boost once per day in addition to meals for supplementation.   She also has a healing pressure ulcer on lateral side of left foot that Dr Sharol Given was previously following. She applies prescription cream (unsure of name) once daily.  No fevers, chills, sweats, red streaks.   Review of Systems  Constitutional: Positive for appetite change (decreased appetite). Negative for fever and chills.  Eyes: Negative for discharge, itching and visual disturbance.  Respiratory: Negative for cough, chest tightness, shortness of breath and wheezing.   Cardiovascular: Negative for chest pain, palpitations and leg swelling.  Gastrointestinal: Negative for nausea, vomiting, diarrhea, constipation and abdominal distention.  Endocrine: Negative for cold intolerance, heat intolerance, polydipsia, polyphagia and polyuria.  Genitourinary: Negative for dysuria, frequency, hematuria and flank pain.  Musculoskeletal: Positive for joint swelling (persistent left knee swelling  post TKR). Negative for myalgias.  Skin: Positive for wound (left lateral foot). Negative for rash.  Neurological: Positive for numbness (bilateral foot). Negative for syncope, weakness and headaches.  Psychiatric/Behavioral: Negative for sleep disturbance and dysphoric mood.       Objective:   Physical Exam  Constitutional: She is oriented to person, place, and time. She appears well-developed and well-nourished. No distress.  HENT:  Head: Normocephalic and atraumatic.  Right Ear: External ear normal.  Left Ear: External ear normal.  Nose: Nose normal.  Mouth/Throat: Oropharynx is clear and moist. No oropharyngeal exudate.  Eyes: Conjunctivae and EOM are normal. Pupils are equal, round, and reactive to light. Right eye exhibits no discharge. Left eye exhibits no discharge.  Neck: Normal range of motion. Neck supple. No thyromegaly present.  Cardiovascular: Normal rate, regular rhythm and normal heart sounds.   No murmur heard. Pulmonary/Chest: Effort normal and breath sounds normal. No respiratory distress. She has no wheezes.  Abdominal: Soft. Bowel sounds are normal. She exhibits no distension. There is no tenderness. There is no rebound and no guarding.  Musculoskeletal: Normal range of motion. She exhibits no edema and no tenderness.  Lymphadenopathy:    She has no cervical adenopathy.  Neurological: She is alert and oriented to person, place, and time.  Skin: Skin is warm and dry. No rash noted. She is not diaphoretic.  Well healed left TKR scar; left lateral foot 5.5cmX4cm bland hyperpigmentation with 67mmx7mm good escar.   Psychiatric: She has a normal mood and affect. Her behavior is normal. Judgment and thought content normal.        Assessment & Plan:

## 2014-02-12 NOTE — Progress Notes (Signed)
   Subjective:    Patient ID: Sheila Morgan, female    DOB: 10/01/1932, 80 y.o.   MRN: [id number]  HPI   She is here for followup of her active health issues. She is post op left total knee replacement done in June by Dr. Noemi Chapel.  Fasting blood sugars range 80 to 90s. She is not checking postprandial glucoses. She has had no hypoglycemia. She's not been able to exercise due to knee surgery. Ophthalmologic exam in May of this year reveal no diabetic changes. She's never seen a Podiatrist  Home blood pressure average is 130 over 70s. She has been compliant with her current medicines without adverse effects. She denies any active cardiac or neuro symptoms  Her dyslipidemia is followed by Dr. Gwenlyn Found, Cardiologist  Her major concern is weight loss of 20 pounds since May. She has had decrease in appetite. She's been using Boost nutritional supplement.  The ulcer on the lateral aspect of the left foot was followed by Dr. Sharol Given; he has released her. She does apply a prescription cream once daily.   Review of Systems   Chest pain, palpitations, tachycardia, exertional dyspnea, paroxysmal nocturnal dyspnea, claudication or edema are absent.  She's had no fever, chills, or sweats. There's been no purulence from the skin lesion on the foot.  Abdominal pain, significant dyspepsia, dysphagia, melena, rectal bleeding, or persistently small caliber stools are denied.       Objective:   Physical Exam  Pertinent or positive findings include: Well-healed total knee replacement scar on the left knee. There is a 5.5 x 4 cm area of bland hyperpigmentation with a central 12 mm x 7 mm eschar formation. There is no active cellulitis. There is no purulence.  Appears healthy and well-nourished & in no acute distress No carotid bruits are present.No neck vein distention present at 10 - 15 degrees. Thyroid normal to palpation Heart rhythm and rate are normal with no gallop . Grade 2/4-0 systolic murmur Chest  is clear with no increased work of breathing There is no evidence of aortic aneurysm or renal artery bruits Abdomen soft with no organomegaly or masses. No HJR No clubbing, cyanosis or edema present. Pedal pulses are intact  No ischemic skin changes are present . Fingernails/ toenails healthy . Absent L great toenail Alert and oriented. Strength, tone, DTRs reflexes normal         Assessment & Plan:  See Current Assessment & Plan in Problem List under specific Diagnosis

## 2014-02-12 NOTE — Assessment & Plan Note (Signed)
Lipids, LFTs, TSH  

## 2014-02-12 NOTE — Progress Notes (Signed)
Pre visit review using our clinic review tool, if applicable. No additional management support is needed unless otherwise documented below in the visit note. 

## 2014-02-12 NOTE — Assessment & Plan Note (Signed)
CBC TSH 

## 2014-02-12 NOTE — Assessment & Plan Note (Signed)
A1c

## 2014-02-13 ENCOUNTER — Other Ambulatory Visit: Payer: Self-pay | Admitting: Internal Medicine

## 2014-02-13 DIAGNOSIS — Z96659 Presence of unspecified artificial knee joint: Secondary | ICD-10-CM | POA: Diagnosis not present

## 2014-02-13 DIAGNOSIS — R7989 Other specified abnormal findings of blood chemistry: Secondary | ICD-10-CM

## 2014-02-13 DIAGNOSIS — E1129 Type 2 diabetes mellitus with other diabetic kidney complication: Secondary | ICD-10-CM

## 2014-02-13 DIAGNOSIS — M1712 Unilateral primary osteoarthritis, left knee: Secondary | ICD-10-CM | POA: Diagnosis not present

## 2014-02-13 DIAGNOSIS — R531 Weakness: Secondary | ICD-10-CM | POA: Diagnosis not present

## 2014-02-13 NOTE — Assessment & Plan Note (Signed)
Referral to Podiatry for Diabetic Shoes

## 2014-02-14 DIAGNOSIS — M1189 Other specified crystal arthropathies, multiple sites: Secondary | ICD-10-CM | POA: Diagnosis not present

## 2014-02-14 DIAGNOSIS — M810 Age-related osteoporosis without current pathological fracture: Secondary | ICD-10-CM | POA: Diagnosis not present

## 2014-02-14 DIAGNOSIS — M150 Primary generalized (osteo)arthritis: Secondary | ICD-10-CM | POA: Diagnosis not present

## 2014-02-15 DIAGNOSIS — Z96659 Presence of unspecified artificial knee joint: Secondary | ICD-10-CM | POA: Diagnosis not present

## 2014-02-15 DIAGNOSIS — R531 Weakness: Secondary | ICD-10-CM | POA: Diagnosis not present

## 2014-02-15 DIAGNOSIS — M1712 Unilateral primary osteoarthritis, left knee: Secondary | ICD-10-CM | POA: Diagnosis not present

## 2014-02-16 ENCOUNTER — Telehealth: Payer: Self-pay | Admitting: *Deleted

## 2014-02-16 NOTE — Telephone Encounter (Signed)
Left msg on triage requesting call bck on letter she received in mail. Called pt back she stated she couldn't read the little letters on labs. Gave her md response. Also inform her md has already place order to have labs done she just need to remember in 4 months need to repeat labs...Johny Chess

## 2014-02-20 DIAGNOSIS — M1712 Unilateral primary osteoarthritis, left knee: Secondary | ICD-10-CM | POA: Diagnosis not present

## 2014-02-20 DIAGNOSIS — Z96659 Presence of unspecified artificial knee joint: Secondary | ICD-10-CM | POA: Diagnosis not present

## 2014-02-20 DIAGNOSIS — R531 Weakness: Secondary | ICD-10-CM | POA: Diagnosis not present

## 2014-02-21 ENCOUNTER — Ambulatory Visit (INDEPENDENT_AMBULATORY_CARE_PROVIDER_SITE_OTHER): Payer: Medicare Other | Admitting: Podiatry

## 2014-02-21 ENCOUNTER — Encounter: Payer: Self-pay | Admitting: Podiatry

## 2014-02-21 VITALS — BP 140/88 | HR 74 | Resp 17

## 2014-02-21 DIAGNOSIS — I2510 Atherosclerotic heart disease of native coronary artery without angina pectoris: Secondary | ICD-10-CM | POA: Diagnosis not present

## 2014-02-21 DIAGNOSIS — M2040 Other hammer toe(s) (acquired), unspecified foot: Secondary | ICD-10-CM | POA: Diagnosis not present

## 2014-02-21 DIAGNOSIS — M21969 Unspecified acquired deformity of unspecified lower leg: Secondary | ICD-10-CM | POA: Diagnosis not present

## 2014-02-21 DIAGNOSIS — Z87898 Personal history of other specified conditions: Secondary | ICD-10-CM

## 2014-02-21 DIAGNOSIS — M79676 Pain in unspecified toe(s): Secondary | ICD-10-CM | POA: Diagnosis not present

## 2014-02-21 DIAGNOSIS — B351 Tinea unguium: Secondary | ICD-10-CM | POA: Diagnosis not present

## 2014-02-21 NOTE — Progress Notes (Signed)
   Subjective:    Patient ID: Sheila Morgan, female    DOB: January 12, 1933, 80 y.o.   MRN: [id number]  HPI Sheila Morgan is a failed stay for diabetic risk assessment, painful elongated nails, and referral for diabetic shoes. Patient has a history of a wound on the lateral aspect of left foot for which she was seen by Dr. Sharol Given, who has released her. She has a prescription cream that she has been applying to this area. Patient's daughter who presents with her today states that the area is significantly improved her prior. She continues with an offloading pad around the area as well as daily dressing changes. Denies any recent redness or drainage from the site. No systemic complaints such as fevers, chills, nausea, vomiting. No other complaints at this time.  Review of Systems  Constitutional: Positive for appetite change, fatigue and unexpected weight change.  HENT: Positive for hearing loss.   Musculoskeletal: Positive for arthralgias and back pain.  Hematological: Bruises/bleeds easily.       Objective:   Physical Exam AAO 3, NAD DP/PT pulses palpable bilaterally, CRT less than 3 seconds Decreased protective sensation with Semmes-Weinstein monofilament Nails hypertrophic, dystrophic, elongated, discolored 10 site erythema or drainage. Small, superficial lesion on the lateral aspect of the left foot at the fifth metatarsal measuring approximately 0.9 x 0.5 which is almost completely healed. There is no surrounding erythema, drainage, ascending saline disc. Hammertoe deformities left foot with mild hallux varus and adductus of the forefoot MMT 4/5, ROM WNL No calf pain with compression, swelling, warmth, erythema    Assessment & Plan:  80 year old female with history of left lateral foot ulceration due to pressure, forefoot deformity -Treatment options discussed including alternatives, risks, complications. -Symptomatic nails are debrided 10 without complications to patient  comfort. -Recommend diabetic shoes for the patient. Filled out paperwork to get precertified and will be sent to her primary care physician. -Discussed the importance of daily foot inspection. -Continue monitor area on the lateral aspect left foot. If the area reopens or worsens to call the office or contact Dr. Sharol Given for further treatment. -Follow-up in 3 months or sooner if any problems are to arise or any change in symptoms. Follow-up with primary care physician for other issues mentioned in the review of systems are these are chronic in no acute change.

## 2014-02-22 DIAGNOSIS — M1712 Unilateral primary osteoarthritis, left knee: Secondary | ICD-10-CM | POA: Diagnosis not present

## 2014-02-22 DIAGNOSIS — Z96659 Presence of unspecified artificial knee joint: Secondary | ICD-10-CM | POA: Diagnosis not present

## 2014-02-22 DIAGNOSIS — R531 Weakness: Secondary | ICD-10-CM | POA: Diagnosis not present

## 2014-02-27 DIAGNOSIS — R531 Weakness: Secondary | ICD-10-CM | POA: Diagnosis not present

## 2014-02-27 DIAGNOSIS — M1712 Unilateral primary osteoarthritis, left knee: Secondary | ICD-10-CM | POA: Diagnosis not present

## 2014-02-27 DIAGNOSIS — Z96659 Presence of unspecified artificial knee joint: Secondary | ICD-10-CM | POA: Diagnosis not present

## 2014-03-01 DIAGNOSIS — Z96659 Presence of unspecified artificial knee joint: Secondary | ICD-10-CM | POA: Diagnosis not present

## 2014-03-01 DIAGNOSIS — M1712 Unilateral primary osteoarthritis, left knee: Secondary | ICD-10-CM | POA: Diagnosis not present

## 2014-03-01 DIAGNOSIS — R531 Weakness: Secondary | ICD-10-CM | POA: Diagnosis not present

## 2014-03-05 ENCOUNTER — Ambulatory Visit: Payer: Medicare Other | Admitting: Internal Medicine

## 2014-03-06 ENCOUNTER — Encounter: Payer: Self-pay | Admitting: Internal Medicine

## 2014-03-06 ENCOUNTER — Ambulatory Visit (INDEPENDENT_AMBULATORY_CARE_PROVIDER_SITE_OTHER): Payer: Medicare Other | Admitting: Internal Medicine

## 2014-03-06 DIAGNOSIS — E1159 Type 2 diabetes mellitus with other circulatory complications: Secondary | ICD-10-CM | POA: Diagnosis not present

## 2014-03-06 DIAGNOSIS — M1712 Unilateral primary osteoarthritis, left knee: Secondary | ICD-10-CM | POA: Diagnosis not present

## 2014-03-06 DIAGNOSIS — E1151 Type 2 diabetes mellitus with diabetic peripheral angiopathy without gangrene: Secondary | ICD-10-CM | POA: Insufficient documentation

## 2014-03-06 DIAGNOSIS — R531 Weakness: Secondary | ICD-10-CM | POA: Diagnosis not present

## 2014-03-06 DIAGNOSIS — I2510 Atherosclerotic heart disease of native coronary artery without angina pectoris: Secondary | ICD-10-CM | POA: Diagnosis not present

## 2014-03-06 NOTE — Progress Notes (Signed)
Pre visit review using our clinic review tool, if applicable. No additional management support is needed unless otherwise documented below in the visit note. 

## 2014-03-06 NOTE — Patient Instructions (Signed)
   Decrease metformin 500 mg one half pill daily after largest meal. Continue to monitor fasting blood glucoses. Report if they go above 150 on average. Repeat the A1c in 4 months.

## 2014-03-06 NOTE — Progress Notes (Signed)
   Subjective:    Patient ID: Sheila Morgan, female    DOB: 1933-02-26, 80 y.o.   MRN: [id number]  HPI   She is on metformin 500 mg daily; she remains on prednisone 5 mg daily for gout prevention.  It was originally prescribed in 2008 for polymyalgia rheumatica.  She had an A1c of 6.4% 3 weeks ago. She was instructed to stay on the metformin 500 mg daily; she is concerned as her fasting blood sugars are 80-90. She's had no frank hypoglycemia.  She has seen the Podiatrist and diabetic shoes  have been recommended. Form completed  Review of Systems   She has no active gout symptoms on the 5 mg of prednisone. She also has no symptoms of  polymyalgia rheumatica at this time       Objective:   Physical Exam   The ulcer over the left lateral foot has essentially healed. There is faint curvilinear hyperpigmentation 11 x 11 mm. There is no ulcer eschar present. Dorsalis pedis pulses are present.  Posterior tibial pulses are decreased. There are diffuse nail deformities especially of  the great toenails. There has been debridement performed recently. The second left nail and toe is foreshortened.        Assessment & Plan:  #1 pressure ulcer healing  #2 diabetes, controlled ,in the context of peripheral vascular disease  Plan: Forms for diabetic shoes completed  She'll be asked to monitor her glucoses on metformin 500 mg one half daily.

## 2014-03-06 NOTE — Assessment & Plan Note (Signed)
Form for Orthotic shoes completed

## 2014-03-07 DIAGNOSIS — Z96659 Presence of unspecified artificial knee joint: Secondary | ICD-10-CM | POA: Diagnosis not present

## 2014-03-07 DIAGNOSIS — R531 Weakness: Secondary | ICD-10-CM | POA: Diagnosis not present

## 2014-03-07 DIAGNOSIS — M1712 Unilateral primary osteoarthritis, left knee: Secondary | ICD-10-CM | POA: Diagnosis not present

## 2014-03-09 DIAGNOSIS — R531 Weakness: Secondary | ICD-10-CM | POA: Diagnosis not present

## 2014-03-09 DIAGNOSIS — Z96659 Presence of unspecified artificial knee joint: Secondary | ICD-10-CM | POA: Diagnosis not present

## 2014-03-09 DIAGNOSIS — M1712 Unilateral primary osteoarthritis, left knee: Secondary | ICD-10-CM | POA: Diagnosis not present

## 2014-03-13 DIAGNOSIS — M1712 Unilateral primary osteoarthritis, left knee: Secondary | ICD-10-CM | POA: Diagnosis not present

## 2014-03-13 DIAGNOSIS — R531 Weakness: Secondary | ICD-10-CM | POA: Diagnosis not present

## 2014-03-13 DIAGNOSIS — Z96659 Presence of unspecified artificial knee joint: Secondary | ICD-10-CM | POA: Diagnosis not present

## 2014-03-15 DIAGNOSIS — R531 Weakness: Secondary | ICD-10-CM | POA: Diagnosis not present

## 2014-03-15 DIAGNOSIS — M1712 Unilateral primary osteoarthritis, left knee: Secondary | ICD-10-CM | POA: Diagnosis not present

## 2014-03-15 DIAGNOSIS — Z96659 Presence of unspecified artificial knee joint: Secondary | ICD-10-CM | POA: Diagnosis not present

## 2014-03-20 ENCOUNTER — Telehealth: Payer: Self-pay | Admitting: Internal Medicine

## 2014-03-20 DIAGNOSIS — Z96659 Presence of unspecified artificial knee joint: Secondary | ICD-10-CM | POA: Diagnosis not present

## 2014-03-20 DIAGNOSIS — M1712 Unilateral primary osteoarthritis, left knee: Secondary | ICD-10-CM | POA: Diagnosis not present

## 2014-03-20 DIAGNOSIS — R531 Weakness: Secondary | ICD-10-CM | POA: Diagnosis not present

## 2014-03-20 NOTE — Telephone Encounter (Signed)
Pt called in and needs notes of last visit faxed over to:   [facility] Fax number  [phone]   She is trying to get diabetic shoes

## 2014-03-20 NOTE — Telephone Encounter (Signed)
03/06/14 office note has been faxed to [phone]

## 2014-03-22 ENCOUNTER — Other Ambulatory Visit: Payer: Self-pay | Admitting: Internal Medicine

## 2014-03-22 DIAGNOSIS — M1712 Unilateral primary osteoarthritis, left knee: Secondary | ICD-10-CM | POA: Diagnosis not present

## 2014-03-22 DIAGNOSIS — Z96659 Presence of unspecified artificial knee joint: Secondary | ICD-10-CM | POA: Diagnosis not present

## 2014-03-22 DIAGNOSIS — R531 Weakness: Secondary | ICD-10-CM | POA: Diagnosis not present

## 2014-03-27 DIAGNOSIS — Z96659 Presence of unspecified artificial knee joint: Secondary | ICD-10-CM | POA: Diagnosis not present

## 2014-03-27 DIAGNOSIS — M1712 Unilateral primary osteoarthritis, left knee: Secondary | ICD-10-CM | POA: Diagnosis not present

## 2014-03-27 DIAGNOSIS — R531 Weakness: Secondary | ICD-10-CM | POA: Diagnosis not present

## 2014-03-31 ENCOUNTER — Other Ambulatory Visit: Payer: Self-pay | Admitting: Internal Medicine
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
## 2014-05-02 ENCOUNTER — Encounter: Payer: Self-pay | Admitting: Cardiovascular Disease

## 2014-05-02 ENCOUNTER — Ambulatory Visit (INDEPENDENT_AMBULATORY_CARE_PROVIDER_SITE_OTHER): Payer: Medicare Other | Admitting: Cardiovascular Disease

## 2014-05-02 VITALS — BP 120/82 | HR 80 | Ht 67.0 in | Wt 149.5 lb

## 2014-05-02 DIAGNOSIS — I2510 Atherosclerotic heart disease of native coronary artery without angina pectoris: Secondary | ICD-10-CM

## 2014-05-02 DIAGNOSIS — I2583 Coronary atherosclerosis due to lipid rich plaque: Secondary | ICD-10-CM

## 2014-05-02 DIAGNOSIS — E785 Hyperlipidemia, unspecified: Secondary | ICD-10-CM

## 2014-05-02 DIAGNOSIS — I10 Essential (primary) hypertension: Secondary | ICD-10-CM | POA: Diagnosis not present

## 2014-05-02 NOTE — Patient Instructions (Signed)
Your physician wants you to follow-up in 1 year with Dr. Berry. You will receive a reminder letter in the mail 2 months in advance. If you do not receive a letter, please call our office to schedule the follow-up appointment.  

## 2014-05-02 NOTE — Assessment & Plan Note (Signed)
History of hyperlipidemia on simvastatin 40 mg a day. Her most recent lipid profile performed 02/12/14 revealed a total cholesterol 150, LDL 64 and HDL of 65.

## 2014-05-02 NOTE — Assessment & Plan Note (Signed)
History of hypertension with blood pressure measured today at 120/82. She is on amlodipine 5 mg a day in addition to losartan 100 mg a day. Continue current meds at current dosing

## 2014-05-02 NOTE — Progress Notes (Signed)
05/02/2014 Sheila Morgan   12/23/1932  [id number]  Primary Physician Unice Cobble, MD Primary Cardiologist: Lorretta Harp MD Sheila Morgan   HPI:  The patient is a 80 year old mildly overweight married [place] American female mother of 2, grandmother to 4 grandchildren who I last saw  65months ago. I take care of her husband as well, who is now chronically ill. She has a history of CAD status post remote 1-vessel coronary artery bypass grafting with a LIMA to her LAD in 1994. I re-catheterized her in November of 2002 revealing a patent LIMA to the LAD with high-grade distal RCA disease which I stented using a 2.75 x 8 mm long BX Philosophy bare metal stent. Her other problems include hypertension, hyperlipidemia, non-insulin-requiring diabetes. She denies chest pain or shortness of breath. She had a Myoview stress test performed March 14, 2012 which was entirely normal. Her last lipid profile was performed 02/12/14 revealed a total cholesterol 150, LDL 64 and HDL of 65. She complains of occasional atypical chest pain.  Current Outpatient Prescriptions  Medication Sig Dispense Refill  . alendronate (FOSAMAX) 70 MG tablet Take 70 mg by mouth once a week. Take with a full glass of water on an empty stomach.    Marland Kitchen amLODipine (NORVASC) 5 MG tablet Take 5 mg by mouth daily.    Marland Kitchen aspirin 81 MG tablet Take 81 mg by mouth daily.    . Calcium Carbonate-Vitamin D (CALCIUM 600-D PO) Take 1 tablet by mouth 2 (two) times daily.     . carvedilol (COREG) 25 MG tablet TAKE 1 TABLET BY MOUTH TWICE DAILY WITH MEALS 60 tablet 5  . colchicine (COLCRYS) 0.6 MG tablet Take 0.6 mg by mouth daily.    . furosemide (LASIX) 20 MG tablet TAKE 1 TABLET BY MOUTH DAILY 30 tablet 5  . isosorbide mononitrate (IMDUR) 30 MG 24 hr tablet TAKE 1/2 TABLET BY MOUTH DAILY 15 tablet 11  . Lancets (ONETOUCH ULTRASOFT) lancets USE AS DIRECTED 100 each 3  . losartan (COZAAR) 100 MG tablet TAKE 1 TABLET BY MOUTH  EVERY DAY 90 tablet 3  . metFORMIN (GLUCOPHAGE) 500 MG tablet 1/2 daily with largest meal 30 tablet 5  . Multiple Vitamin (MULITIVITAMIN WITH MINERALS) TABS Take 1 tablet by mouth daily.    . ONE TOUCH ULTRA TEST test strip USE AS DIRECTED 100 each 3  . predniSONE (DELTASONE) 5 MG tablet Take 5 mg by mouth daily.    . simvastatin (ZOCOR) 40 MG tablet TAKE 1 TABLET BY MOUTH EVERY EVENING 30 tablet 9  . vitamin B-12 (CYANOCOBALAMIN) 1000 MCG tablet Take 1,000 mcg by mouth daily.       No current facility-administered medications for this visit.    No Known Allergies  History   Social History  . Marital Status: Married    Spouse Name: N/A    Number of Children: N/A  . Years of Education: N/A   Occupational History  . Not on file.   Social History Main Topics  . Smoking status: Never Smoker   . Smokeless tobacco: Never Used  . Alcohol Use: No  . Drug Use: No  . Sexual Activity: Not Currently   Other Topics Concern  . Not on file   Social History Narrative     Review of Systems: General: negative for chills, fever, night sweats or weight changes.  Cardiovascular: negative for chest pain, dyspnea on exertion, edema, orthopnea, palpitations, paroxysmal nocturnal dyspnea or shortness of breath Dermatological:  negative for rash Respiratory: negative for cough or wheezing Urologic: negative for hematuria Abdominal: negative for nausea, vomiting, diarrhea, bright red blood per rectum, melena, or hematemesis Neurologic: negative for visual changes, syncope, or dizziness All other systems reviewed and are otherwise negative except as noted above.    Blood pressure 120/82, pulse 80, height 5\' 7"  (1.702 m), weight 149 lb 8 oz (67.813 kg).  General appearance: alert and no distress Neck: no adenopathy, no carotid bruit, no JVD, supple, symmetrical, trachea midline and thyroid not enlarged, symmetric, no tenderness/mass/nodules Lungs: clear to auscultation bilaterally Heart:  regular rate and rhythm, S1, S2 normal, no murmur, click, rub or gallop Extremities: extremities normal, atraumatic, no cyanosis or edema  EKG normal sinus rhythm at 80 without ST or T-wave changes. I personally reviewed this EKG  ASSESSMENT AND PLAN:   Essential hypertension History of hypertension with blood pressure measured today at 120/82. She is on amlodipine 5 mg a day in addition to losartan 100 mg a day. Continue current meds at current dosing  Coronary artery disease History of coronary artery bypass grafting with a LIMA to LAD July 1994. She had bare metal stent laced her RCA November 2002. Her last Myoview performed 03/14/12 was nonischemic. She gets occasional, infrequent atypical chest pain. She is on isosorbide and a beta blocker.  Hyperlipidemia History of hyperlipidemia on simvastatin 40 mg a day. Her most recent lipid profile performed 02/12/14 revealed a total cholesterol 150, LDL 64 and HDL of 65.      Lorretta Harp MD FACP,FACC,FAHA, [facility] 05/02/2014 10:08 AM

## 2014-05-02 NOTE — Assessment & Plan Note (Signed)
History of coronary artery bypass grafting with a LIMA to LAD July 1994. She had bare metal stent laced her RCA November 2002. Her last Myoview performed 03/14/12 was nonischemic. She gets occasional, infrequent atypical chest pain. She is on isosorbide and a beta blocker.

## 2014-05-03 DIAGNOSIS — M8580 Other specified disorders of bone density and structure, unspecified site: Secondary | ICD-10-CM | POA: Diagnosis not present

## 2014-05-03 DIAGNOSIS — Z803 Family history of malignant neoplasm of breast: Secondary | ICD-10-CM | POA: Diagnosis not present

## 2014-05-03 DIAGNOSIS — Z1231 Encounter for screening mammogram for malignant neoplasm of breast: Secondary | ICD-10-CM | POA: Diagnosis not present

## 2014-05-03 LAB — HM DEXA SCAN: HM Dexa Scan: -2.2

## 2014-05-10 DIAGNOSIS — M1712 Unilateral primary osteoarthritis, left knee: Secondary | ICD-10-CM | POA: Diagnosis not present

## 2014-05-30 ENCOUNTER — Ambulatory Visit (INDEPENDENT_AMBULATORY_CARE_PROVIDER_SITE_OTHER): Payer: Medicare Other | Admitting: Podiatry

## 2014-05-30 ENCOUNTER — Encounter: Payer: Self-pay | Admitting: Podiatry

## 2014-05-30 VITALS — BP 134/75 | HR 75 | Resp 18

## 2014-05-30 DIAGNOSIS — B351 Tinea unguium: Secondary | ICD-10-CM

## 2014-05-30 DIAGNOSIS — M79676 Pain in unspecified toe(s): Secondary | ICD-10-CM | POA: Diagnosis not present

## 2014-05-30 NOTE — Patient Instructions (Signed)
Diabetes and Foot Care Diabetes may cause you to have problems because of poor blood supply (circulation) to your feet and legs. This may cause the skin on your feet to become thinner, break easier, and heal more slowly. Your skin may become dry, and the skin may peel and crack. You may also have nerve damage in your legs and feet causing decreased feeling in them. You may not notice minor injuries to your feet that could lead to infections or more serious problems. Taking care of your feet is one of the most important things you can do for yourself.  HOME CARE INSTRUCTIONS  Wear shoes at all times, even in the house. Do not go barefoot. Bare feet are easily injured.  Check your feet daily for blisters, cuts, and redness. If you cannot see the bottom of your feet, use a mirror or ask someone for help.  Wash your feet with warm water (do not use hot water) and mild soap. Then pat your feet and the areas between your toes until they are completely dry. Do not soak your feet as this can dry your skin.  Apply a moisturizing lotion or petroleum jelly (that does not contain alcohol and is unscented) to the skin on your feet and to dry, brittle toenails. Do not apply lotion between your toes.  Trim your toenails straight across. Do not dig under them or around the cuticle. File the edges of your nails with an emery board or nail file.  Do not cut corns or calluses or try to remove them with medicine.  Wear clean socks or stockings every day. Make sure they are not too tight. Do not wear knee-high stockings since they may decrease blood flow to your legs.  Wear shoes that fit properly and have enough cushioning. To break in new shoes, wear them for just a few hours a day. This prevents you from injuring your feet. Always look in your shoes before you put them on to be sure there are no objects inside.  Do not cross your legs. This may decrease the blood flow to your feet.  If you find a minor scrape,  cut, or break in the skin on your feet, keep it and the skin around it clean and dry. These areas may be cleansed with mild soap and water. Do not cleanse the area with peroxide, alcohol, or iodine.  When you remove an adhesive bandage, be sure not to damage the skin around it.  If you have a wound, look at it several times a day to make sure it is healing.  Do not use heating pads or hot water bottles. They may burn your skin. If you have lost feeling in your feet or legs, you may not know it is happening until it is too late.  Make sure your health care provider performs a complete foot exam at least annually or more often if you have foot problems. Report any cuts, sores, or bruises to your health care provider immediately. SEEK MEDICAL CARE IF:   You have an injury that is not healing.  You have cuts or breaks in the skin.  You have an ingrown nail.  You notice redness on your legs or feet.  You feel burning or tingling in your legs or feet.  You have pain or cramps in your legs and feet.  Your legs or feet are numb.  Your feet always feel cold. SEEK IMMEDIATE MEDICAL CARE IF:   There is increasing redness,   swelling, or pain in or around a wound.  There is a red line that goes up your leg.  Pus is coming from a wound.  You develop a fever or as directed by your health care provider.  You notice a bad smell coming from an ulcer or wound. Document Released: 04/17/2000 Document Revised: 12/21/2012 Document Reviewed: 09/27/2012 ExitCare Patient Information 2015 [employer]. This information is not intended to replace advice given to you by your health care provider. Make sure you discuss any questions you have with your health care provider.  

## 2014-06-04 ENCOUNTER — Encounter: Payer: Self-pay | Admitting: Podiatry

## 2014-06-04 NOTE — Progress Notes (Signed)
Patient ID: Sheila Morgan, female   DOB: 09/28/1932, 81 y.o.   MRN: [id number]  Subjective: 81 year old female presents the office today with complaints of painful elongated toenails. She denies any recent redness or drainage from around the nail sites. He states the wound on the side of her left foot is healed. She also states that the diabetic shoes are fitting well without any complications. No other complaints at this time in no acute changes since last appointment.  Objective: AAO 3, NAD DP/PT pulses palpable bilaterally, CRT less than 3 seconds Decreased protective sensation with Simms Weinstein monofilament, decreased vibratory sensation, Achilles tendon reflex intact. Nails are hypertrophic, elongated, dystrophic, discolored 10. There is subjective tenderness along all 10 toenails. There is no surrounding erythema or drainage from around the nail sites. Scar along the lateral aspect the left foot over the site of the prior ulceration. No other open lesions or pre-ulcerative lesions identified. No other areas of tenderness to bilateral lower extremities. No overlying edema, erythema, increase in warmth. No pain with calf compression, swelling, warmth, erythema.  Assessment: 81 year old female with symptomatic onychomycosis  Plan: -Treatment options were discussed with the patient including alternatives, risks, complications. -Nail sharply debrided 10 without complications/bleeding. -Discussed the importance of daily foot inspection. -Follow-up in 3 months or sooner should any problems arise. In the meantime, encouraged call the office with any questions, concerns, change in symptoms.

## 2014-06-12 DIAGNOSIS — M1712 Unilateral primary osteoarthritis, left knee: Secondary | ICD-10-CM | POA: Diagnosis not present

## 2014-06-19 ENCOUNTER — Telehealth: Payer: Self-pay | Admitting: Internal Medicine

## 2014-06-19 MED ORDER — ONETOUCH ULTRASOFT LANCETS MISC: Status: DC

## 2014-06-19 MED ORDER — GLUCOSE BLOOD VI STRP: ORAL_STRIP | Status: DC

## 2014-06-19 NOTE — Telephone Encounter (Signed)
Pt called request refill for lancet and test strip for one touch ultra to be send to [facility]. Pt test twice a day as direct. Pt also request lab work enter as direct for a1c in 4 month since November.

## 2014-06-19 NOTE — Telephone Encounter (Signed)
Diabetes supplies have been sent to [facility]. a1c order already placed.

## 2014-06-22 ENCOUNTER — Telehealth: Payer: Self-pay | Admitting: Internal Medicine

## 2014-06-22 NOTE — Telephone Encounter (Signed)
Sheila Morgan called from [facility] stated she is faxing ove the CMN for our office to fill and sumite to medicare in order for them to pay for it. Please look out for this fax and help pt get this ASAP.

## 2014-06-25 ENCOUNTER — Telehealth: Payer: Self-pay | Admitting: Internal Medicine

## 2014-07-20 ENCOUNTER — Other Ambulatory Visit: Payer: Self-pay

## 2014-07-20 MED ORDER — ONETOUCH ULTRASOFT LANCETS MISC: Status: DC

## 2014-08-13 ENCOUNTER — Other Ambulatory Visit: Payer: Medicare Other

## 2014-08-13 ENCOUNTER — Other Ambulatory Visit: Payer: Self-pay | Admitting: Internal Medicine

## 2014-08-13 ENCOUNTER — Telehealth: Payer: Self-pay | Admitting: Internal Medicine

## 2014-08-13 DIAGNOSIS — E1129 Type 2 diabetes mellitus with other diabetic kidney complication: Secondary | ICD-10-CM

## 2014-08-13 DIAGNOSIS — R7989 Other specified abnormal findings of blood chemistry: Secondary | ICD-10-CM

## 2014-08-13 NOTE — Telephone Encounter (Signed)
Dr. Hopper, please advise. 

## 2014-08-13 NOTE — Telephone Encounter (Signed)
Advised pt of dr hoppers notes, pt repeated back instructions to take bp readings and repeated back understanding of how to take several readings for one week, she is to keep a record of readings and bring to office visit for dr hopper to review on 4/18

## 2014-08-13 NOTE — Telephone Encounter (Signed)

## 2014-08-13 NOTE — Telephone Encounter (Signed)
Patient is worried about her blood pressure. She can't come in till next week and so she is scheduled for Mon. 04/18. She says her blood pressure was running 140/85, then 150 then 160. She would like to know if that is way to high and if you can give her a call

## 2014-08-15 ENCOUNTER — Other Ambulatory Visit (INDEPENDENT_AMBULATORY_CARE_PROVIDER_SITE_OTHER): Payer: Medicare Other

## 2014-08-15 DIAGNOSIS — M810 Age-related osteoporosis without current pathological fracture: Secondary | ICD-10-CM | POA: Diagnosis not present

## 2014-08-15 DIAGNOSIS — M150 Primary generalized (osteo)arthritis: Secondary | ICD-10-CM | POA: Diagnosis not present

## 2014-08-15 DIAGNOSIS — R7989 Other specified abnormal findings of blood chemistry: Secondary | ICD-10-CM

## 2014-08-15 DIAGNOSIS — E1129 Type 2 diabetes mellitus with other diabetic kidney complication: Secondary | ICD-10-CM

## 2014-08-15 DIAGNOSIS — R946 Abnormal results of thyroid function studies: Secondary | ICD-10-CM

## 2014-08-15 DIAGNOSIS — E1121 Type 2 diabetes mellitus with diabetic nephropathy: Secondary | ICD-10-CM | POA: Diagnosis not present

## 2014-08-15 DIAGNOSIS — M1189 Other specified crystal arthropathies, multiple sites: Secondary | ICD-10-CM | POA: Diagnosis not present

## 2014-08-15 LAB — TSH: TSH: 5.74 u[IU]/mL — ABNORMAL HIGH (ref 0.35–4.50)

## 2014-08-15 LAB — T4, FREE: FREE T4: 0.88 ng/dL (ref 0.60–1.60)

## 2014-08-15 LAB — HEMOGLOBIN A1C: HEMOGLOBIN A1C: 6.4 % (ref 4.6–6.5)

## 2014-08-17 ENCOUNTER — Other Ambulatory Visit: Payer: Self-pay | Admitting: Internal Medicine

## 2014-08-20 ENCOUNTER — Other Ambulatory Visit (INDEPENDENT_AMBULATORY_CARE_PROVIDER_SITE_OTHER): Payer: Medicare Other

## 2014-08-20 ENCOUNTER — Ambulatory Visit (INDEPENDENT_AMBULATORY_CARE_PROVIDER_SITE_OTHER): Payer: Medicare Other | Admitting: Internal Medicine

## 2014-08-20 ENCOUNTER — Encounter: Payer: Self-pay | Admitting: Internal Medicine

## 2014-08-20 VITALS — BP 124/82 | HR 79 | Temp 97.5°F | Resp 14 | Wt 154.0 lb

## 2014-08-20 DIAGNOSIS — I10 Essential (primary) hypertension: Secondary | ICD-10-CM | POA: Diagnosis not present

## 2014-08-20 DIAGNOSIS — E038 Other specified hypothyroidism: Secondary | ICD-10-CM

## 2014-08-20 LAB — BASIC METABOLIC PANEL
BUN: 18 mg/dL (ref 6–23)
CALCIUM: 10.7 mg/dL — AB (ref 8.4–10.5)
CO2: 35 meq/L — AB (ref 19–32)
CREATININE: 1.03 mg/dL (ref 0.40–1.20)
Chloride: 100 mEq/L (ref 96–112)
GFR: 66.32 mL/min (ref >60.00)
GLUCOSE: 115 mg/dL — AB (ref 70–99)
Potassium: 3.4 mEq/L — ABNORMAL LOW (ref 3.5–5.1)
Sodium: 143 mEq/L (ref 135–145)

## 2014-08-20 MED ORDER — ONETOUCH ULTRA MINI W/DEVICE KIT: PACK | Status: DC

## 2014-08-20 MED ORDER — LEVOTHYROXINE SODIUM 25 MCG PO TABS: 25.0000 ug | ORAL_TABLET | Freq: Every day | ORAL | Status: DC

## 2014-08-20 NOTE — Addendum Note (Signed)
Addended byUnice Cobble F on: 08/20/2014 10:01 AM   Modules accepted: Orders

## 2014-08-20 NOTE — Progress Notes (Signed)
   Subjective:    Patient ID: Sheila Morgan, female    DOB: [birth year], 81 y.o.   MRN: [id number]  HPI  Her TSH was 5.74; this represents slow progression. She's not on thyroid replacement    Her A1c is 6.4 on 250 mg of metformin daily. She has no hypoglycemia.   Review of Systems     Objective:   Physical Exam        Assessment & Plan:  #1 early hypothyroidism n   #2 prediabetes with A1c of 6.4%. If renal function is impaired; the metformin will be discontinued.  Plan: Synthroid 25 mg daily. Recheck TSH in 10 weeks.

## 2014-08-20 NOTE — Progress Notes (Signed)
   Subjective:    Patient ID: Sheila Morgan, female    DOB: 06/18/1932, 81 y.o.   MRN: [id number]  HPI  She has been compliant with her medications without adverse effects. She has been diligent in recording blood pressures. Random average is 142/78;range is 121/69-164/93. She denies any associated cardiopulmonary symptoms except for some tension headaches.  This is in the context of having lost her husband 07/16/14 associated with expected stresses.  Review of Systems  Chest pain, palpitations, tachycardia, exertional dyspnea, paroxysmal nocturnal dyspnea, claudication or edema are absent.      Objective:   Physical Exam  Appears healthy and well-nourished & in no acute distress.Appears younger than stated age  No carotid bruits are present.No neck vein distention present at 10 - 15 degrees. Thyroid normal to palpation  Heart rhythm and rate are normal with no gallop or murmur.Slught accentuation S2  Chest is clear with no increased work of breathing  There is no evidence of aortic aneurysm or renal artery bruits  Abdomen soft with no organomegaly or masses. No HJR  No clubbing, cyanosis or edema present.  Pedal pulses are intact   No ischemic skin changes are present . Fingernails healthy   Alert and oriented. Strength, tone, DTRs reflexes (L knee not checked) normal        Assessment & Plan:  See Current Assessment & Plan in Problem List under specific Diagnosis

## 2014-08-20 NOTE — Progress Notes (Signed)
Pre visit review using our clinic review tool, if applicable. No additional management support is needed unless otherwise documented below in the visit note. 

## 2014-08-20 NOTE — Patient Instructions (Addendum)
Minimal Blood Pressure Goal= AVERAGE < 140/90;  Ideal is an AVERAGE < 135/85. This AVERAGE should be calculated from @ least 5-7 BP readings taken @ different times of day on different days of week. You should not respond to isolated BP readings , but rather the AVERAGE for that week .Please bring your  blood pressure cuff to office visits to verify that it is reliable.It  can also be checked against the blood pressure device at the pharmacy. Finger or wrist cuffs are not dependable; an arm cuff is.   Your next office appointment will be determined based upon review of your pending labs .  Those instructions will be transmitted to you by mail for your records.  Critical results will be called.   Followup as needed for any active or acute issue. Please report any significant change in your symptoms.

## 2014-08-20 NOTE — Assessment & Plan Note (Signed)
Blood pressure goals reviewed. BMET 

## 2014-08-23 ENCOUNTER — Encounter: Payer: Self-pay | Admitting: Internal Medicine

## 2014-08-23 ENCOUNTER — Other Ambulatory Visit: Payer: Self-pay | Admitting: Internal Medicine

## 2014-08-29 ENCOUNTER — Ambulatory Visit: Payer: Medicare Other | Admitting: Podiatry

## 2014-08-30 ENCOUNTER — Ambulatory Visit (INDEPENDENT_AMBULATORY_CARE_PROVIDER_SITE_OTHER): Payer: Medicare Other

## 2014-08-30 DIAGNOSIS — M79673 Pain in unspecified foot: Secondary | ICD-10-CM | POA: Diagnosis not present

## 2014-08-30 DIAGNOSIS — M79676 Pain in unspecified toe(s): Secondary | ICD-10-CM

## 2014-08-30 DIAGNOSIS — B351 Tinea unguium: Secondary | ICD-10-CM | POA: Diagnosis not present

## 2014-09-04 NOTE — Progress Notes (Signed)
HPI Presents today chief complaint of painful elongated toenails.  Objective: Pulses are palpable bilateral nails are thick, yellow dystrophic onychomycosis and painful palpation.   Assessment: Onychomycosis with pain in limb.  Plan: Treatment of nails in thickness and length as covered service secondary to pain.  

## 2014-09-13 DIAGNOSIS — Z96652 Presence of left artificial knee joint: Secondary | ICD-10-CM | POA: Diagnosis not present

## 2014-09-20 DIAGNOSIS — H2513 Age-related nuclear cataract, bilateral: Secondary | ICD-10-CM | POA: Diagnosis not present

## 2014-09-20 DIAGNOSIS — E119 Type 2 diabetes mellitus without complications: Secondary | ICD-10-CM | POA: Diagnosis not present

## 2014-09-20 LAB — HM DIABETES EYE EXAM

## 2014-09-25 ENCOUNTER — Other Ambulatory Visit: Payer: Self-pay | Admitting: *Deleted

## 2014-09-25 MED ORDER — FUROSEMIDE 20 MG PO TABS: 20.0000 mg | ORAL_TABLET | Freq: Every day | ORAL | Status: DC

## 2014-09-26 ENCOUNTER — Encounter: Payer: Self-pay | Admitting: Internal Medicine

## 2014-10-09 DIAGNOSIS — H04123 Dry eye syndrome of bilateral lacrimal glands: Secondary | ICD-10-CM | POA: Diagnosis not present

## 2014-10-09 DIAGNOSIS — H2513 Age-related nuclear cataract, bilateral: Secondary | ICD-10-CM | POA: Diagnosis not present

## 2014-10-11 ENCOUNTER — Other Ambulatory Visit: Payer: Self-pay | Admitting: Emergency Medicine

## 2014-10-11 MED ORDER — CARVEDILOL 25 MG PO TABS: 25.0000 mg | ORAL_TABLET | Freq: Two times a day (BID) | ORAL | Status: DC

## 2014-10-11 NOTE — Telephone Encounter (Signed)
Rx for Carvedilol sent to Pharm.

## 2014-10-17 ENCOUNTER — Other Ambulatory Visit: Payer: Self-pay | Admitting: Internal Medicine

## 2014-10-17 NOTE — Telephone Encounter (Signed)
One touch strips rx sent to pharm

## 2014-10-29 ENCOUNTER — Other Ambulatory Visit: Payer: Self-pay | Admitting: Cardiovascular Disease

## 2014-10-29 NOTE — Telephone Encounter (Signed)
Rx(s) sent to pharmacy electronically.  

## 2014-10-30 DIAGNOSIS — Z96652 Presence of left artificial knee joint: Secondary | ICD-10-CM | POA: Diagnosis not present

## 2014-10-30 DIAGNOSIS — M1612 Unilateral primary osteoarthritis, left hip: Secondary | ICD-10-CM | POA: Diagnosis not present

## 2014-11-01 DIAGNOSIS — M25552 Pain in left hip: Secondary | ICD-10-CM | POA: Diagnosis not present

## 2014-11-06 DIAGNOSIS — H25043 Posterior subcapsular polar age-related cataract, bilateral: Secondary | ICD-10-CM | POA: Diagnosis not present

## 2014-11-13 ENCOUNTER — Other Ambulatory Visit: Payer: Self-pay | Admitting: Cardiovascular Disease

## 2014-11-13 NOTE — Telephone Encounter (Signed)
Rx(s) sent to pharmacy electronically.  

## 2014-11-23 ENCOUNTER — Other Ambulatory Visit: Payer: Self-pay | Admitting: Internal Medicine

## 2014-11-29 ENCOUNTER — Ambulatory Visit (INDEPENDENT_AMBULATORY_CARE_PROVIDER_SITE_OTHER): Payer: Medicare Other | Admitting: Podiatry

## 2014-11-29 ENCOUNTER — Encounter: Payer: Self-pay | Admitting: Podiatry

## 2014-11-29 DIAGNOSIS — E1159 Type 2 diabetes mellitus with other circulatory complications: Secondary | ICD-10-CM | POA: Diagnosis not present

## 2014-11-29 DIAGNOSIS — M79676 Pain in unspecified toe(s): Secondary | ICD-10-CM

## 2014-11-29 DIAGNOSIS — E1151 Type 2 diabetes mellitus with diabetic peripheral angiopathy without gangrene: Secondary | ICD-10-CM

## 2014-11-29 DIAGNOSIS — B351 Tinea unguium: Secondary | ICD-10-CM | POA: Diagnosis not present

## 2014-11-29 NOTE — Progress Notes (Signed)
Patient ID: Sheila Morgan, female   DOB: 22-May-1932, 81 y.o.   MRN: [id number] Complaint:  Visit Type: Patient returns to my office for continued preventative foot care services. Complaint: Patient states" my nails have grown long and thick and become painful to walk and wear shoes" Patient has been diagnosed with DM with no foot complications. The patient presents for preventative foot care services. No changes to ROS  Podiatric Exam: Vascular: dorsalis pedis  are palpable bilateral  Posterior tibial pulses are absent B/L.Marland Kitchen Capillary return is immediate. Temperature gradient is WNL. Skin turgor WNL  Sensorium: Normal Semmes Weinstein monofilament test. Normal tactile sensation bilaterally. Nail Exam: Pt has thick disfigured discolored nails with subungual debris noted bilateral entire nail hallux through fifth toenails Ulcer Exam: There is no evidence of ulcer or pre-ulcerative changes or infection. Orthopedic Exam: Muscle tone and strength are WNL. No limitations in general ROM. No crepitus or effusions noted. Foot type and digits show no abnormalities. Bony prominences are unremarkable. Skin: No Porokeratosis. No infection or ulcers  Diagnosis:  Onychomycosis, , Pain in right toe, pain in left toes  Treatment & Plan Procedures and Treatment: Consent by patient was obtained for treatment procedures. The patient understood the discussion of treatment and procedures well. All questions were answered thoroughly reviewed. Debridement of mycotic and hypertrophic toenails, 1 through 5 bilateral and clearing of subungual debris. No ulceration, no infection noted.  Return Visit-Office Procedure: Patient instructed to return to the office for a follow up visit 3 months for continued evaluation and treatment.

## 2014-12-04 DIAGNOSIS — M25552 Pain in left hip: Secondary | ICD-10-CM | POA: Diagnosis not present

## 2014-12-04 DIAGNOSIS — M1612 Unilateral primary osteoarthritis, left hip: Secondary | ICD-10-CM | POA: Diagnosis not present

## 2014-12-06 ENCOUNTER — Other Ambulatory Visit: Payer: Self-pay | Admitting: Emergency Medicine

## 2014-12-06 MED ORDER — METFORMIN HCL 500 MG PO TABS: ORAL_TABLET | ORAL | Status: DC

## 2014-12-12 DIAGNOSIS — H25042 Posterior subcapsular polar age-related cataract, left eye: Secondary | ICD-10-CM | POA: Diagnosis not present

## 2014-12-12 DIAGNOSIS — H25812 Combined forms of age-related cataract, left eye: Secondary | ICD-10-CM | POA: Diagnosis not present

## 2015-01-01 ENCOUNTER — Other Ambulatory Visit: Payer: Self-pay | Admitting: Emergency Medicine

## 2015-01-01 MED ORDER — ONETOUCH ULTRASOFT LANCETS MISC: Status: DC

## 2015-01-10 DIAGNOSIS — M1612 Unilateral primary osteoarthritis, left hip: Secondary | ICD-10-CM | POA: Diagnosis not present

## 2015-01-12 ENCOUNTER — Other Ambulatory Visit: Payer: Self-pay | Admitting: Internal Medicine

## 2015-01-14 DIAGNOSIS — M1612 Unilateral primary osteoarthritis, left hip: Secondary | ICD-10-CM | POA: Diagnosis not present

## 2015-01-15 ENCOUNTER — Other Ambulatory Visit: Payer: Self-pay | Admitting: *Deleted

## 2015-01-15 MED ORDER — LOSARTAN POTASSIUM 100 MG PO TABS: 100.0000 mg | ORAL_TABLET | Freq: Every day | ORAL | Status: DC

## 2015-01-15 NOTE — Telephone Encounter (Signed)
Requesting refill on pt Losartan. Sent refill electronically,,,/lmb

## 2015-02-05 ENCOUNTER — Other Ambulatory Visit: Payer: Self-pay | Admitting: Emergency Medicine

## 2015-02-05 MED ORDER — METFORMIN HCL 500 MG PO TABS: ORAL_TABLET | ORAL | Status: DC

## 2015-02-10 ENCOUNTER — Other Ambulatory Visit: Payer: Self-pay | Admitting: Internal Medicine

## 2015-03-06 DIAGNOSIS — M150 Primary generalized (osteo)arthritis: Secondary | ICD-10-CM | POA: Diagnosis not present

## 2015-03-06 DIAGNOSIS — M1189 Other specified crystal arthropathies, multiple sites: Secondary | ICD-10-CM | POA: Diagnosis not present

## 2015-03-06 DIAGNOSIS — M810 Age-related osteoporosis without current pathological fracture: Secondary | ICD-10-CM | POA: Diagnosis not present

## 2015-03-08 ENCOUNTER — Ambulatory Visit: Payer: Medicare Other | Admitting: Internal Medicine

## 2015-03-08 DIAGNOSIS — R4689 Other symptoms and signs involving appearance and behavior: Secondary | ICD-10-CM | POA: Insufficient documentation

## 2015-03-08 DIAGNOSIS — Z0289 Encounter for other administrative examinations: Secondary | ICD-10-CM

## 2015-03-11 ENCOUNTER — Telehealth: Payer: Self-pay | Admitting: *Deleted

## 2015-03-11 NOTE — Telephone Encounter (Signed)
Pt left her name and phone number only.  Pt states she would like to cancel her appt for tomorrow.

## 2015-03-12 ENCOUNTER — Ambulatory Visit: Payer: Medicare Other | Admitting: Podiatry

## 2015-03-13 ENCOUNTER — Encounter: Payer: Self-pay | Admitting: Internal Medicine

## 2015-03-13 ENCOUNTER — Other Ambulatory Visit: Payer: Self-pay | Admitting: Internal Medicine

## 2015-03-13 ENCOUNTER — Other Ambulatory Visit (INDEPENDENT_AMBULATORY_CARE_PROVIDER_SITE_OTHER): Payer: Medicare Other

## 2015-03-13 ENCOUNTER — Other Ambulatory Visit: Payer: Medicare Other

## 2015-03-13 ENCOUNTER — Ambulatory Visit (INDEPENDENT_AMBULATORY_CARE_PROVIDER_SITE_OTHER): Payer: Medicare Other | Admitting: Internal Medicine

## 2015-03-13 VITALS — BP 150/84 | HR 75 | Temp 97.9°F | Resp 18 | Ht 67.0 in | Wt 163.1 lb

## 2015-03-13 DIAGNOSIS — E1151 Type 2 diabetes mellitus with diabetic peripheral angiopathy without gangrene: Secondary | ICD-10-CM | POA: Diagnosis not present

## 2015-03-13 DIAGNOSIS — I10 Essential (primary) hypertension: Secondary | ICD-10-CM

## 2015-03-13 DIAGNOSIS — Z23 Encounter for immunization: Secondary | ICD-10-CM | POA: Diagnosis not present

## 2015-03-13 MED ORDER — ONETOUCH ULTRASOFT LANCETS MISC: Status: DC

## 2015-03-13 MED ORDER — GLUCOSE BLOOD VI STRP: ORAL_STRIP | Status: DC

## 2015-03-13 NOTE — Progress Notes (Signed)
   Subjective:    Patient ID: Sheila Morgan, female    DOB: 1932-07-31, 81 y.o.   MRN: [id number]  HPI  She is concerned as BP have tended higher recently. Blood pressures have ranged from 129/71-153/94. She is not on a thiazide diuretic; she has a history of gout.  Her most recent renal function tests were done 08/20/14. Creatinine was 1.03, BUN 18, and GFR 66.32.  She's also requesting a refill of her diabetic testing supplies.   Review of Systems  Chest pain, palpitations, tachycardia, exertional dyspnea, paroxysmal nocturnal dyspnea, claudication or edema are absent.      Objective:   Physical Exam  Pertinent or positive findings include: She has minor rales at the lung bases. Pedal pulses are decreased, especially dorsalis pedis pulses.  General appearance :adequately nourished; in no distress.Appears younger than stated age  Eyes: No conjunctival inflammation or scleral icterus is present.  Oral exam:  Lips and gums are healthy appearing.There is no oropharyngeal erythema or exudate noted.   Heart:  Normal rate and regular rhythm. S1 and S2 normal without gallop, murmur, click, rub or other extra sounds    Lungs:No increased work of breathing.   Abdomen: bowel sounds normal, soft and non-tender without masses, organomegaly or hernias noted.  No guarding or rebound.   Vascular : all pulses equal ; no bruits present.  Skin:Warm & dry.  Intact without suspicious lesions or rashes ; no tenting .  Lymphatic: No lymphadenopathy is noted about the head, neck, axilla.   Neuro: Strength, tone decreased. She ambulates with a cane. She was unable to get onto the examining room table because she cannot step up on the platform and was examined while sitting.     Assessment & Plan:  See Current Assessment & Plan in Problem List under specific Diagnosis

## 2015-03-13 NOTE — Assessment & Plan Note (Signed)
Blood pressure goals reviewed. BMET Increase Amlodipine if not @ goal

## 2015-03-13 NOTE — Patient Instructions (Signed)
Avoid ingestion of  excess salt/sodium.Cook with pepper & other spices . Use the salt substitute "No Salt" OR the Mrs Deliah Boston products to season food @ the table. Avoid foods which taste salty or "vinegary" as their sodium content will be high.  Minimal Blood Pressure Goal= AVERAGE < 140/90;  Ideal is an AVERAGE < 135/85. This AVERAGE should be calculated from @ least 5-7 BP readings taken @ different times of day on different days of week. You should not respond to isolated BP readings , but rather the AVERAGE for that week .Please bring your  blood pressure cuff to office visits to verify that it is reliable.It  can also be checked against the blood pressure device at the pharmacy. Finger or wrist cuffs are not dependable; an arm cuff is.   If the blood pressure does average over 140/90 over a two-week period; increase amlodipine from 5 mg daily to 10 mg daily.

## 2015-03-13 NOTE — Progress Notes (Signed)
Pre visit review using our clinic review tool, if applicable. No additional management support is needed unless otherwise documented below in the visit note. 

## 2015-03-14 LAB — BASIC METABOLIC PANEL
BUN: 17 mg/dL (ref 6–23)
CALCIUM: 10.2 mg/dL (ref 8.4–10.5)
CO2: 34 mEq/L — ABNORMAL HIGH (ref 19–32)
CREATININE: 1.2 mg/dL (ref 0.40–1.20)
Chloride: 100 mEq/L (ref 96–112)
GFR: 55.52 mL/min — AB (ref >60.00)
Glucose, Bld: 142 mg/dL — ABNORMAL HIGH (ref 70–99)
POTASSIUM: 4.4 meq/L (ref 3.5–5.1)
Sodium: 141 mEq/L (ref 135–145)

## 2015-03-20 ENCOUNTER — Other Ambulatory Visit: Payer: Self-pay | Admitting: Internal Medicine

## 2015-03-21 ENCOUNTER — Other Ambulatory Visit: Payer: Self-pay | Admitting: Emergency Medicine

## 2015-03-21 MED ORDER — FUROSEMIDE 20 MG PO TABS: 20.0000 mg | ORAL_TABLET | Freq: Every day | ORAL | Status: DC

## 2015-04-12 ENCOUNTER — Ambulatory Visit (INDEPENDENT_AMBULATORY_CARE_PROVIDER_SITE_OTHER): Payer: Medicare Other | Admitting: Podiatry

## 2015-04-12 ENCOUNTER — Encounter: Payer: Self-pay | Admitting: Podiatry

## 2015-04-12 DIAGNOSIS — B351 Tinea unguium: Secondary | ICD-10-CM

## 2015-04-12 DIAGNOSIS — M79676 Pain in unspecified toe(s): Secondary | ICD-10-CM

## 2015-04-12 NOTE — Progress Notes (Signed)
Patient ID: Sheila Morgan, female   DOB: 03-May-1933, 81 y.o.   MRN: [id number] Complaint:  Visit Type: Patient returns to my office for continued preventative foot care services. Complaint: Patient states" my nails have grown long and thick and become painful to walk and wear shoes" Patient has been diagnosed with DM with no foot complications. The patient presents for preventative foot care services. No changes to ROS  Podiatric Exam: Vascular: dorsalis pedis  are palpable bilateral  Posterior tibial pulses are absent B/L.Marland Kitchen Capillary return is immediate. Temperature gradient is WNL. Skin turgor WNL  Sensorium: Normal Semmes Weinstein monofilament test. Normal tactile sensation bilaterally. Nail Exam: Pt has thick disfigured discolored nails with subungual debris noted bilateral entire nail hallux through fifth toenails Ulcer Exam: There is no evidence of ulcer or pre-ulcerative changes or infection. Orthopedic Exam: Muscle tone and strength are WNL. No limitations in general ROM. No crepitus or effusions noted. Foot type and digits show no abnormalities. Bony prominences are unremarkable. Skin: No Porokeratosis. No infection or ulcers  Diagnosis:  Onychomycosis, , Pain in right toe, pain in left toes  Treatment & Plan Procedures and Treatment: Consent by patient was obtained for treatment procedures. The patient understood the discussion of treatment and procedures well. All questions were answered thoroughly reviewed. Debridement of mycotic and hypertrophic toenails, 1 through 5 bilateral and clearing of subungual debris. No ulceration, no infection noted.  Return Visit-Office Procedure: Patient instructed to return to the office for a follow up visit 3 months for continued evaluation and treatment.  Gardiner Barefoot DPM

## 2015-04-23 ENCOUNTER — Ambulatory Visit (INDEPENDENT_AMBULATORY_CARE_PROVIDER_SITE_OTHER): Payer: Medicare Other | Admitting: Cardiovascular Disease

## 2015-04-23 ENCOUNTER — Encounter: Payer: Self-pay | Admitting: Cardiovascular Disease

## 2015-04-23 VITALS — BP 128/76 | HR 73 | Ht 66.0 in | Wt 165.0 lb

## 2015-04-23 DIAGNOSIS — E785 Hyperlipidemia, unspecified: Secondary | ICD-10-CM

## 2015-04-23 DIAGNOSIS — I10 Essential (primary) hypertension: Secondary | ICD-10-CM

## 2015-04-23 DIAGNOSIS — Z951 Presence of aortocoronary bypass graft: Secondary | ICD-10-CM

## 2015-04-23 LAB — LIPID PANEL
CHOL/HDL RATIO: 2.1 ratio (ref <=5.0)
CHOLESTEROL: 152 mg/dL (ref 125–200)
HDL: 71 mg/dL (ref >=46)
LDL Cholesterol: 58 mg/dL (ref <130)
Triglycerides: 113 mg/dL (ref <150)
VLDL: 23 mg/dL (ref <30)

## 2015-04-23 LAB — HEPATIC FUNCTION PANEL
ALK PHOS: 82 U/L (ref 33–130)
ALT: 25 U/L (ref 6–29)
AST: 20 U/L (ref 10–35)
Albumin: 4.1 g/dL (ref 3.6–5.1)
BILIRUBIN DIRECT: 0.1 mg/dL (ref <=0.2)
BILIRUBIN TOTAL: 0.6 mg/dL (ref 0.2–1.2)
Indirect Bilirubin: 0.5 mg/dL (ref 0.2–1.2)
Total Protein: 6.4 g/dL (ref 6.1–8.1)

## 2015-04-23 NOTE — Assessment & Plan Note (Signed)
History of CAD status post coronary artery bypass grafting in 1994 with a LIMA to her LAD. I catheterized her November 2002 revealing a patent LIMA to the LAD with a high-grade distal RCA stenosis which I stented using a 2.75 mm x 8 mm long BX velocity bare metal stent. Her last Myoview stress test performed 03/14/12 was entirely normal. She does get occasional atypical chest pain which has not changed in frequency or severity.

## 2015-04-23 NOTE — Assessment & Plan Note (Signed)
History of hypertension blood pressure measured today at 128/76. She is on amlodipine, carvedilol and losartan. Continue current meds at current dosing

## 2015-04-23 NOTE — Assessment & Plan Note (Signed)
History of hyperlipidemia on statin therapy. We'll recheck a lipid and liver profile 

## 2015-04-23 NOTE — Progress Notes (Signed)
04/23/2015 Sheila Morgan   10-28-1932  [id number]  Primary Physician Unice Cobble, MD Primary Cardiologist: Lorretta Harp MD Sheila Morgan   HPI:  The patient is a 81 year old mildly overweight married [place] American female mother of 2, grandmother to 4 grandchildren who I last saw 05/02/14.. I take care of her husband as well, who is now chronically ill. She has a history of CAD status post remote 1-vessel coronary artery bypass grafting with a LIMA to her LAD in 1994. I re-catheterized her in November of 2002 revealing a patent LIMA to the LAD with high-grade distal RCA disease which I stented using a 2.75 x 8 mm long BX Philosophy bare metal stent. Her other problems include hypertension, hyperlipidemia, non-insulin-requiring diabetes.  She had a Myoview stress test performed March 14, 2012 which was entirely normal. Since I saw her a year ago she's remained clinically stable and she continues to have atypical chest pain.  Current Outpatient Prescriptions  Medication Sig Dispense Refill  . alendronate (FOSAMAX) 70 MG tablet Take 70 mg by mouth once a week. Take with a full glass of water on an empty stomach.    Marland Kitchen amLODipine (NORVASC) 5 MG tablet TAKE 1 TABLET BY MOUTH DAILY 90 tablet 0  . amoxicillin (AMOXIL) 500 MG capsule TK 4 CS PO 1 PRIOR TO DENTAL PROCEDURE  2  . aspirin 81 MG tablet Take 81 mg by mouth daily.    . Blood Glucose Monitoring Suppl (ONE TOUCH ULTRA MINI) W/DEVICE KIT Use to test blood sugar twice daily ICD 10 E11 21 1 each 0  . Calcium Carbonate-Vitamin D (CALCIUM 600-D PO) Take 1 tablet by mouth 2 (two) times daily.     . carvedilol (COREG) 25 MG tablet Take 1 tablet (25 mg total) by mouth 2 (two) times daily with a meal. 60 tablet 5  . colchicine (COLCRYS) 0.6 MG tablet Take 0.6 mg by mouth daily.    . furosemide (LASIX) 20 MG tablet Take 1 tablet (20 mg total) by mouth daily. 30 tablet 5  . glucose blood (ONE TOUCH ULTRA TEST) test strip USE  TO TEST SUGARS TWICE DAILY E11.9 100 each 5  . isosorbide mononitrate (IMDUR) 30 MG 24 hr tablet TAKE 1/2 TABLET BY MOUTH DAILY 15 tablet 4  . Lancets (ONETOUCH ULTRASOFT) lancets TEST BLOOD SUGAR TWICE DAILY ICD 10 CODE E11 21 100 each 5  . levothyroxine (SYNTHROID, LEVOTHROID) 25 MCG tablet TAKE 1 TABLET(25 MCG) BY MOUTH DAILY BEFORE BREAKFAST 90 tablet 1  . losartan (COZAAR) 100 MG tablet Take 1 tablet (100 mg total) by mouth daily. 90 tablet 1  . metFORMIN (GLUCOPHAGE) 500 MG tablet 1/2 daily with largest meal 45 tablet 0  . Multiple Vitamin (MULITIVITAMIN WITH MINERALS) TABS Take 1 tablet by mouth daily.    . predniSONE (DELTASONE) 5 MG tablet Take 5 mg by mouth daily.    . simvastatin (ZOCOR) 40 MG tablet Take 1 tablet (40 mg total) by mouth every evening. 30 tablet 6  . vitamin B-12 (CYANOCOBALAMIN) 1000 MCG tablet Take 1,000 mcg by mouth daily.      . VOLTAREN 1 % GEL   0   No current facility-administered medications for this visit.    No Known Allergies  Social History   Social History  . Marital Status: Married    Spouse Name: N/A  . Number of Children: N/A  . Years of Education: N/A   Occupational History  . Not on file.  Social History Main Topics  . Smoking status: Never Smoker   . Smokeless tobacco: Never Used  . Alcohol Use: No  . Drug Use: No  . Sexual Activity: Not Currently   Other Topics Concern  . Not on file   Social History Narrative     Review of Systems: General: negative for chills, fever, night sweats or weight changes.  Cardiovascular: negative for chest pain, dyspnea on exertion, edema, orthopnea, palpitations, paroxysmal nocturnal dyspnea or shortness of breath Dermatological: negative for rash Respiratory: negative for cough or wheezing Urologic: negative for hematuria Abdominal: negative for nausea, vomiting, diarrhea, bright red blood per rectum, melena, or hematemesis Neurologic: negative for visual changes, syncope, or dizziness All  other systems reviewed and are otherwise negative except as noted above.    Blood pressure 128/76, pulse 73, height 5' 6"  (1.676 m), weight 165 lb (74.844 kg).  General appearance: alert and no distress Neck: no adenopathy, no carotid bruit, no JVD, supple, symmetrical, trachea midline and thyroid not enlarged, symmetric, no tenderness/mass/nodules Lungs: clear to auscultation bilaterally Heart: regular rate and rhythm, S1, S2 normal, no murmur, click, rub or gallop Extremities: extremities normal, atraumatic, no cyanosis or edema  EKG normal sinus rhythm at 73 without ST or T-wave changes. I personally reviewed this EKG  ASSESSMENT AND PLAN:   Hyperlipidemia History of hyperlipidemia on statin therapy. We'll recheck a lipid and liver profile  Essential hypertension History of hypertension blood pressure measured today at 128/76. She is on amlodipine, carvedilol and losartan. Continue current meds at current dosing  CORONARY ARTERY BYPASS GRAFT, HX OF History of CAD status post coronary artery bypass grafting in 1994 with a LIMA to her LAD. I catheterized her November 2002 revealing a patent LIMA to the LAD with a high-grade distal RCA stenosis which I stented using a 2.75 mm x 8 mm long BX velocity bare metal stent. Her last Myoview stress test performed 03/14/12 was entirely normal. She does get occasional atypical chest pain which has not changed in frequency or severity.      Lorretta Harp MD FACP,FACC,FAHA, [facility] 04/23/2015 9:51 AM

## 2015-04-23 NOTE — Patient Instructions (Signed)
Medication Instructions:   NO CHANGE  Labwork:  Your physician recommends that you HAVE LAB WORK TODAY  Follow-Up:  Your physician wants you to follow-up in: ONE YEAR WITH DR BERRY You will receive a reminder letter in the mail two months in advance. If you don't receive a letter, please call our office to schedule the follow-up appointment.   If you need a refill on your cardiac medications before your next appointment, please call your pharmacy.    

## 2015-05-02 ENCOUNTER — Encounter: Payer: Self-pay | Admitting: *Deleted

## 2015-05-03 ENCOUNTER — Telehealth: Payer: Self-pay | Admitting: Cardiovascular Disease

## 2015-05-03 MED ORDER — ISOSORBIDE MONONITRATE ER 30 MG PO TB24: 15.0000 mg | ORAL_TABLET | Freq: Every day | ORAL | Status: DC

## 2015-05-03 NOTE — Telephone Encounter (Signed)
SPOKE TO MEGAN VERBAL ORDER  REFILL FOR ISOSORBDE X 6

## 2015-05-08 DIAGNOSIS — Z1231 Encounter for screening mammogram for malignant neoplasm of breast: Secondary | ICD-10-CM | POA: Diagnosis not present

## 2015-05-08 DIAGNOSIS — Z803 Family history of malignant neoplasm of breast: Secondary | ICD-10-CM | POA: Diagnosis not present

## 2015-05-10 ENCOUNTER — Other Ambulatory Visit: Payer: Self-pay | Admitting: Internal Medicine

## 2015-05-10 ENCOUNTER — Telehealth: Payer: Self-pay | Admitting: Internal Medicine

## 2015-05-10 MED ORDER — METFORMIN HCL 500 MG PO TABS: ORAL_TABLET | ORAL | Status: DC

## 2015-05-10 NOTE — Telephone Encounter (Signed)
sent 

## 2015-05-10 NOTE — Telephone Encounter (Signed)
I have made an appointment for her on 2/9. She will need her prescription for Metformin sent to [facility] on [address]

## 2015-05-27 ENCOUNTER — Other Ambulatory Visit: Payer: Self-pay | Admitting: Internal Medicine

## 2015-06-06 DIAGNOSIS — M810 Age-related osteoporosis without current pathological fracture: Secondary | ICD-10-CM | POA: Diagnosis not present

## 2015-06-06 DIAGNOSIS — M150 Primary generalized (osteo)arthritis: Secondary | ICD-10-CM | POA: Diagnosis not present

## 2015-06-06 DIAGNOSIS — M1189 Other specified crystal arthropathies, multiple sites: Secondary | ICD-10-CM | POA: Diagnosis not present

## 2015-06-13 ENCOUNTER — Encounter: Payer: Self-pay | Admitting: Internal Medicine

## 2015-06-13 ENCOUNTER — Other Ambulatory Visit (INDEPENDENT_AMBULATORY_CARE_PROVIDER_SITE_OTHER): Payer: Medicare Other

## 2015-06-13 ENCOUNTER — Ambulatory Visit (INDEPENDENT_AMBULATORY_CARE_PROVIDER_SITE_OTHER): Payer: Medicare Other | Admitting: Internal Medicine

## 2015-06-13 VITALS — BP 142/80 | HR 82 | Temp 97.8°F | Resp 16

## 2015-06-13 DIAGNOSIS — E785 Hyperlipidemia, unspecified: Secondary | ICD-10-CM | POA: Diagnosis not present

## 2015-06-13 DIAGNOSIS — E1122 Type 2 diabetes mellitus with diabetic chronic kidney disease: Secondary | ICD-10-CM | POA: Diagnosis not present

## 2015-06-13 DIAGNOSIS — E039 Hypothyroidism, unspecified: Secondary | ICD-10-CM | POA: Insufficient documentation

## 2015-06-13 DIAGNOSIS — Z23 Encounter for immunization: Secondary | ICD-10-CM | POA: Diagnosis not present

## 2015-06-13 DIAGNOSIS — E538 Deficiency of other specified B group vitamins: Secondary | ICD-10-CM | POA: Diagnosis not present

## 2015-06-13 DIAGNOSIS — M810 Age-related osteoporosis without current pathological fracture: Secondary | ICD-10-CM | POA: Diagnosis not present

## 2015-06-13 DIAGNOSIS — E038 Other specified hypothyroidism: Secondary | ICD-10-CM | POA: Diagnosis not present

## 2015-06-13 DIAGNOSIS — R42 Dizziness and giddiness: Secondary | ICD-10-CM

## 2015-06-13 DIAGNOSIS — I2510 Atherosclerotic heart disease of native coronary artery without angina pectoris: Secondary | ICD-10-CM

## 2015-06-13 DIAGNOSIS — I2583 Coronary atherosclerosis due to lipid rich plaque: Secondary | ICD-10-CM

## 2015-06-13 LAB — COMPREHENSIVE METABOLIC PANEL
ALBUMIN: 4 g/dL (ref 3.5–5.2)
ALT: 23 U/L (ref 0–35)
AST: 19 U/L (ref 0–37)
Alkaline Phosphatase: 91 U/L (ref 39–117)
BUN: 17 mg/dL (ref 6–23)
CO2: 32 meq/L (ref 19–32)
Calcium: 10.2 mg/dL (ref 8.4–10.5)
Chloride: 103 mEq/L (ref 96–112)
Creatinine, Ser: 1.09 mg/dL (ref 0.40–1.20)
GFR: 62 mL/min (ref >60.00)
Glucose, Bld: 157 mg/dL — ABNORMAL HIGH (ref 70–99)
POTASSIUM: 3.9 meq/L (ref 3.5–5.1)
Sodium: 144 mEq/L (ref 135–145)
TOTAL PROTEIN: 6.9 g/dL (ref 6.0–8.3)
Total Bilirubin: 0.5 mg/dL (ref 0.2–1.2)

## 2015-06-13 LAB — VITAMIN B12: Vitamin B-12: >1500 pg/mL — ABNORMAL HIGH (ref 211–911)

## 2015-06-13 LAB — TSH: TSH: 2.13 u[IU]/mL (ref 0.35–4.50)

## 2015-06-13 LAB — HEMOGLOBIN A1C: Hgb A1c MFr Bld: 6.7 % — ABNORMAL HIGH (ref 4.6–6.5)

## 2015-06-13 NOTE — Progress Notes (Signed)
Subjective:    Patient ID: Sheila Morgan, female    DOB: Oct 25, 1932, 82 y.o.   MRN: [id number]  HPI She is here to establish with a new pcp.   Lightheaded/dizziness:  She feels like that all the time.  She tried PT - they came to her house once. She did not find this helpful.  She has seen ENT.  She is unsure if this started when any of her medications were started - it has been going on for a long time.   Cold symptoms:  She started having cold symptoms last night.  She has a cough, nasal congestion and headache.  She denies fever, sob, wheeze, sinus pain or sore throat.     Diabetes: She is taking her medication daily as prescribed. She is not compliant with a diabetic diet. She is not exercising regularly. She monitors her sugars and they have been running 98-110 in the morning. She checks her feet daily and denies foot lesions. She sees a podiatrist.  She is up-to-date with an ophthalmology examination.   Hypertension, CAD: She is taking her medication daily. She is not compliant with a low sodium diet.  She denies chest pain, palpitations, edema, shortness of breath and regular headaches. She is not exercising regularly.  She does monitor her blood pressure at home, 140/80s.    Hyperlipidemia: She is taking her medication daily. She is not compliant with a low fat/cholesterol diet. She is not exercising regularly. She denies myalgias.   OP:  Followed by gyn.  She is taking the fosamax weekly.     Medications and allergies reviewed with patient and updated if appropriate.  Patient Active Problem List   Diagnosis Date Noted  . Non-compliant behavior 03/08/2015  . Hypercalcemia 08/23/2014  . DM (diabetes mellitus) type II controlled peripheral vascular disorder ([place]) 03/06/2014  . Pressure ulcer stage II 02/12/2014  . Loss of weight 02/12/2014  . Shingles 10/27/2013  . Gout 10/15/2013  . Postoperative anemia due to acute blood loss 10/11/2013  . DJD (degenerative joint disease)  of knee 10/09/2013  . Cancer ([place])   . Arthritis   . Hiatal hernia   . Osteoporosis   . PONV (postoperative nausea and vomiting)   . Coronary artery disease   . Left knee DJD   . Benign paroxysmal positional vertigo 05/19/2013  . Weakness generalized 05/09/2013  . Influenza 05/09/2013  . Pseudogout of left knee 03/29/2013  . Acute right lumbar radiculopathy 03/18/2013  . Diabetes mellitus with renal manifestations, controlled ([place]) 03/18/2013  . Shoulder impingement syndrome 01/25/2013  . Vertigo 10/17/2012  . Renal insufficiency, mild 06/20/2011  . Essential hypertension 12/31/2009  . OSTEOPOROSIS 12/31/2009  . SKIN CANCER, HX OF 12/31/2009  . Proteinuria 01/05/2008  . UNSPECIFIED ANEMIA 12/21/2007  . BLOOD IN STOOL, OCCULT 12/21/2007  . Hyperlipidemia 09/08/2006  . Polymyalgia rheumatica ([place]) 09/08/2006  . ALKALINE PHOSPHATASE, ELEVATED 09/08/2006  . CORONARY ARTERY BYPASS GRAFT, HX OF 09/08/2006    Current Outpatient Prescriptions on File Prior to Visit  Medication Sig Dispense Refill  . alendronate (FOSAMAX) 70 MG tablet Take 70 mg by mouth once a week. Take with a full glass of water on an empty stomach.    Marland Kitchen amLODipine (NORVASC) 5 MG tablet Take 1 tablet (5 mg total) by mouth daily. Must estab w/new provider for future refills 90 tablet 0  . amoxicillin (AMOXIL) 500 MG capsule TK 4 CS PO 1 PRIOR TO DENTAL PROCEDURE  2  . aspirin  81 MG tablet Take 81 mg by mouth daily.    . Blood Glucose Monitoring Suppl (ONE TOUCH ULTRA MINI) W/DEVICE KIT Use to test blood sugar twice daily ICD 10 E11 21 1 each 0  . Calcium Carbonate-Vitamin D (CALCIUM 600-D PO) Take 1 tablet by mouth 2 (two) times daily.     . carvedilol (COREG) 25 MG tablet Take 1 tablet (25 mg total) by mouth 2 (two) times daily with a meal. 60 tablet 5  . colchicine (COLCRYS) 0.6 MG tablet Take 0.6 mg by mouth daily.    . furosemide (LASIX) 20 MG tablet Take 1 tablet (20 mg total) by mouth daily. 30 tablet 5  .  glucose blood (ONE TOUCH ULTRA TEST) test strip USE TO TEST SUGARS TWICE DAILY E11.9 100 each 5  . isosorbide mononitrate (IMDUR) 30 MG 24 hr tablet Take 0.5 tablets (15 mg total) by mouth daily. 15 tablet 6  . Lancets (ONETOUCH ULTRASOFT) lancets TEST BLOOD SUGAR TWICE DAILY ICD 10 CODE E11 21 100 each 5  . levothyroxine (SYNTHROID, LEVOTHROID) 25 MCG tablet Take 1 tablet (25 mcg total) by mouth daily before breakfast. Must keep 06/13/15 appt for future refills 30 tablet 0  . losartan (COZAAR) 100 MG tablet Take 1 tablet (100 mg total) by mouth daily. 90 tablet 1  . metFORMIN (GLUCOPHAGE) 500 MG tablet 1/2 daily with largest meal 45 tablet 0  . Multiple Vitamin (MULITIVITAMIN WITH MINERALS) TABS Take 1 tablet by mouth daily.    . predniSONE (DELTASONE) 5 MG tablet Take 5 mg by mouth daily.    . simvastatin (ZOCOR) 40 MG tablet Take 1 tablet (40 mg total) by mouth every evening. 30 tablet 6  . vitamin B-12 (CYANOCOBALAMIN) 1000 MCG tablet Take 1,000 mcg by mouth daily.      . VOLTAREN 1 % GEL   0   No current facility-administered medications on file prior to visit.    Past Medical History  Diagnosis Date  . Hyperlipidemia   . Hypertension   . Diabetes mellitus     2  . Arthritis   . Hiatal hernia   . Hemorrhoids   . Osteoporosis   . PONV (postoperative nausea and vomiting)   . Coronary artery disease     status post post LAD angioplasty in 1993  and subsequent coronary artery bypass grafting x1 with a LIMA to the LAD July of 1994. She had stenting of her RCA with a bare-metal stent November 2002.  Marland Kitchen Left knee DJD   . Lightheadedness     has seen Dr Gwenlyn Found and ENT.. cant find out why  . Heart murmur   . Cancer ([place])     skin on nose .  mylenoma  . History of blood transfusion   . HOH (hard of hearing)   . Hematoma     Right groin  . Gout     Past Surgical History  Procedure Laterality Date  . Coronary angioplasty with stent placement  03/09/01    RCA  . Total hip arthroplasty     . Coronary artery bypass graft  1994  . Joint replacement Right 1998    hip  . Skin graft      to nose skin cancer  . Total knee arthroplasty Left 10/09/2013    Procedure: TOTAL KNEE ARTHROPLASTY;  Surgeon: Lorn Junes, MD;  Location: [facility];  Service: Orthopedics;  Laterality: Left;  . Coronary angioplasty  1993    LAD  . Nm  myoview ltd  03/2012    Normal    Social History   Social History  . Marital Status: Married    Spouse Name: N/A  . Number of Children: N/A  . Years of Education: N/A   Social History Main Topics  . Smoking status: Never Smoker   . Smokeless tobacco: Never Used  . Alcohol Use: No  . Drug Use: No  . Sexual Activity: Not Currently   Other Topics Concern  . None   Social History Narrative    Family History  Problem Relation Age of Onset  . Heart disease Father   . Cancer Sister     breast  . Diabetes Sister   . Heart disease Brother   . Heart disease Brother   . Heart disease Brother   . Diabetes Sister     Review of Systems  Constitutional: Negative for fever and chills.  HENT: Positive for congestion. Negative for ear pain, sinus pressure and sore throat.   Respiratory: Positive for cough. Negative for shortness of breath and wheezing.   Cardiovascular: Negative for chest pain, palpitations and leg swelling.  Neurological: Positive for dizziness, light-headedness and headaches (pressure in temple regions, constant).       Objective:   Filed Vitals:   06/13/15 1015  BP: 142/80  Pulse: 82  Temp: 97.8 F (36.6 C)  Resp: 16   There were no vitals filed for this visit. There is no weight on file to calculate BMI.   Physical Exam GENERAL APPEARANCE: Appears stated age, well appearing, NAD EYES: conjunctiva clear, no icterus HEENT: bilateral tympanic membranes and ear canals normal, oropharynx with no erythema, no thyromegaly, trachea midline, no cervical or supraclavicular lymphadenopathy LUNGS: Clear to auscultation without  wheeze or crackles, unlabored breathing, good air entry bilaterally HEART: Normal S1,S2 without murmurs EXTREMITIES: Without clubbing, cyanosis, or edema        Assessment & Plan:   See Problem List for Assessment and Plan of chronic medical problems.

## 2015-06-13 NOTE — Patient Instructions (Signed)
  We have reviewed your prior records including labs and tests today.  Test(s) ordered today. Your results will be released to [place] (or called to you) after review, usually within 72hours after test completion. If any changes need to be made, you will be notified at that same time.   prevnar vaccine administered today.   Medications reviewed and updated.  No changes recommended at this time.   A referral was ordered for Neurology.   Please schedule followup in 6 months

## 2015-06-13 NOTE — Progress Notes (Signed)
Pre visit review using our clinic review tool, if applicable. No additional management support is needed unless otherwise documented below in the visit note. 

## 2015-06-14 ENCOUNTER — Telehealth: Payer: Self-pay | Admitting: Internal Medicine

## 2015-06-14 NOTE — Telephone Encounter (Signed)
[place] Day - Client [place] [facility] Patient Name: Sheila Morgan DOB: February 13, 1933 Initial Comment Caller states, mother has a cold, was seen yesterday, ahs gotten worse over night, now has a bad cough and is very congested. Nurse Assessment Guidelines Guideline Title Affirmed Question Affirmed Notes Cough - Acute Productive Patient sounds very sick or weak to the triager Final Disposition User Go to ED Now (or PCP triage) [place], Therapist, sports, Amy Comments DAUGHTER WANTS TO BRING HER INTO THE [place]. SHE WAS INSTRUCTED IF SHE HAS ANY WORSENING SYMPTOMS TONIGHT THAT SHE WILL NEED TO TAKE HER INTO THE ED. SHE VERBALIZED UNDERSTANDING. SHE STATES THAT SINCE YESTERDAY HER COUGH HAS WORSENING AND FEELS LIKE SHE NEEDS TO BE SEEN AS LAST YEAR SHE WAS HOSPITALIZED FOR FLU OR PNEUMONIA AND SHE DOES NOT WANT THIS TO HAPPEN TO HER AGAIN. Referrals REFERRED TO PCP OFFICE [facility] Disagree/Comply: Comply

## 2015-06-15 ENCOUNTER — Encounter: Payer: Self-pay | Admitting: Internal Medicine

## 2015-06-15 ENCOUNTER — Other Ambulatory Visit: Payer: Self-pay | Admitting: Cardiovascular Disease

## 2015-06-15 ENCOUNTER — Ambulatory Visit (INDEPENDENT_AMBULATORY_CARE_PROVIDER_SITE_OTHER): Payer: Medicare Other | Admitting: Internal Medicine

## 2015-06-15 VITALS — BP 136/80 | HR 85 | Temp 98.7°F | Resp 20 | Ht 66.0 in | Wt 171.0 lb

## 2015-06-15 DIAGNOSIS — J22 Unspecified acute lower respiratory infection: Secondary | ICD-10-CM

## 2015-06-15 DIAGNOSIS — J988 Other specified respiratory disorders: Secondary | ICD-10-CM | POA: Diagnosis not present

## 2015-06-15 DIAGNOSIS — I2510 Atherosclerotic heart disease of native coronary artery without angina pectoris: Secondary | ICD-10-CM | POA: Diagnosis not present

## 2015-06-15 MED ORDER — HYDROCODONE-HOMATROPINE 5-1.5 MG/5ML PO SYRP: ORAL_SOLUTION | ORAL | Status: DC

## 2015-06-15 NOTE — Assessment & Plan Note (Signed)
Check a1c Sugars controlled at home continue current medication

## 2015-06-15 NOTE — Assessment & Plan Note (Signed)
Managed by gyn

## 2015-06-15 NOTE — Assessment & Plan Note (Signed)
Asymptomatic Following with cardiology Continue current medications 

## 2015-06-15 NOTE — Assessment & Plan Note (Signed)
Dizziness/lightheadedness - chronic  Has seen ENT and evaluated by PT once  - no improvement Will refer to neuro

## 2015-06-15 NOTE — Progress Notes (Signed)
Chief Complaint  Patient presents with  . Cough  . Sore Throat  . chest congestion    HPI: Patient comes in today for S[facility] Saturday clinic for  new problem evaluation. Seen pcp 3 days ago  For cdm col began evening 2 8   Cold and coughing  Dec appetite and sore throat .  Coughing  Up  Lumpy .   No sinus pressure .    self rx  Cough  Last night better night . Rob dm otc  No fever.  Has arthritis here with family member  ROS: See pertinent positives and negatives per HPI.  Past Medical History  Diagnosis Date  . Hyperlipidemia   . Hypertension   . Diabetes mellitus     2  . Arthritis   . Hiatal hernia   . Hemorrhoids   . Osteoporosis   . PONV (postoperative nausea and vomiting)   . Coronary artery disease     status post post LAD angioplasty in 1993  and subsequent coronary artery bypass grafting x1 with a LIMA to the LAD July of 1994. She had stenting of her RCA with a bare-metal stent November 2002.  Marland Kitchen Left knee DJD   . Lightheadedness     has seen Dr Gwenlyn Found and ENT.. cant find out why  . Heart murmur   . Cancer ([place])     skin on nose .  mylenoma  . History of blood transfusion   . HOH (hard of hearing)   . Hematoma     Right groin  . Gout     Family History  Problem Relation Age of Onset  . Heart disease Father   . Cancer Sister     breast  . Diabetes Sister   . Heart disease Brother   . Heart disease Brother   . Heart disease Brother   . Diabetes Sister     Social History   Social History  . Marital Status: Married    Spouse Name: N/A  . Number of Children: N/A  . Years of Education: N/A   Social History Main Topics  . Smoking status: Never Smoker   . Smokeless tobacco: Never Used  . Alcohol Use: No  . Drug Use: No  . Sexual Activity: Not Currently   Other Topics Concern  . None   Social History Narrative    Outpatient Prescriptions Prior to Visit  Medication Sig Dispense Refill  . alendronate (FOSAMAX) 70 MG tablet Take 70 mg by  mouth once a week. Take with a full glass of water on an empty stomach.    Marland Kitchen amLODipine (NORVASC) 5 MG tablet Take 1 tablet (5 mg total) by mouth daily. Must estab w/new provider for future refills 90 tablet 0  . aspirin 81 MG tablet Take 81 mg by mouth daily.    . Blood Glucose Monitoring Suppl (ONE TOUCH ULTRA MINI) W/DEVICE KIT Use to test blood sugar twice daily ICD 10 E11 21 1 each 0  . Calcium Carbonate-Vitamin D (CALCIUM 600-D PO) Take 1 tablet by mouth 2 (two) times daily.     . carvedilol (COREG) 25 MG tablet Take 1 tablet (25 mg total) by mouth 2 (two) times daily with a meal. 60 tablet 5  . colchicine (COLCRYS) 0.6 MG tablet Take 0.6 mg by mouth daily.    . furosemide (LASIX) 20 MG tablet Take 1 tablet (20 mg total) by mouth daily. 30 tablet 5  . glucose blood (ONE TOUCH ULTRA TEST)  test strip USE TO TEST SUGARS TWICE DAILY E11.9 100 each 5  . isosorbide mononitrate (IMDUR) 30 MG 24 hr tablet Take 0.5 tablets (15 mg total) by mouth daily. 15 tablet 6  . Lancets (ONETOUCH ULTRASOFT) lancets TEST BLOOD SUGAR TWICE DAILY ICD 10 CODE E11 21 100 each 5  . levothyroxine (SYNTHROID, LEVOTHROID) 25 MCG tablet Take 1 tablet (25 mcg total) by mouth daily before breakfast. Must keep 06/13/15 appt for future refills 30 tablet 0  . losartan (COZAAR) 100 MG tablet Take 1 tablet (100 mg total) by mouth daily. 90 tablet 1  . metFORMIN (GLUCOPHAGE) 500 MG tablet 1/2 daily with largest meal 45 tablet 0  . Multiple Vitamin (MULITIVITAMIN WITH MINERALS) TABS Take 1 tablet by mouth daily.    . predniSONE (DELTASONE) 5 MG tablet Take 5 mg by mouth daily.    . simvastatin (ZOCOR) 40 MG tablet Take 1 tablet (40 mg total) by mouth every evening. 30 tablet 6  . vitamin B-12 (CYANOCOBALAMIN) 1000 MCG tablet Take 1,000 mcg by mouth daily.      Marland Kitchen amoxicillin (AMOXIL) 500 MG capsule TK 4 CS PO 1 PRIOR TO DENTAL PROCEDURE  2  . VOLTAREN 1 % GEL   0   No facility-administered medications prior to visit.      EXAM:  BP 136/80 mmHg  Pulse 85  Temp(Src) 98.7 F (37.1 C) (Oral)  Resp 20  Ht _0  (1.676 m)  Wt 171 lb (77.565 kg)  BMI 27.61 kg/m2  SpO2 95%  Body mass index is 27.61 kg/(m^2). WDWN in NAD  quiet respirations; mildly congested  somewhat hoarse. Non toxic . HEENT: Normocephalic ;atraumatic , Eyes;  PERRL, EOMs  Full, lids and conjunctiva clear,,Ears: no deformities, canals nl, TM landmarks normal, Nose: no deformity or discharge but congested;face minimally non tender  Mouth : OP clear without lesion or edema . Mild erythema  Neck: Supple without adenopathy or masses or bruits Chest:  Clear to A without wheezes rales or rhonchi CV:  S1-S2 no gallops or murmurs peripheral perfusion is normal Skin :nl perfusion and no acute rashes   PSYCH: pleasant and cooperative, no obvious depression or anxiety ambulatory limited by joint pain   ASSESSMENT AND PLAN:  Discussed the following assessment and plan:  Acute respiratory infection - prob viral  close fo for alarm sx . expectant mangement cough med for comfort  3-4 days of sx   No alarm features cough med for comfort .   Expectant management.   -Patient advised to return or notify health care team  if symptoms worsen ,persist or new concerns arise.  Patient Instructions  Chest is clear today This appears to be a viral respiratory infection  Will cough for another week but if gets fever shortness of breath or chest pain . Contact health team again .  Cough med in day ok and can try cough med hydrocodeine at night .  For comfort .      Standley Brooking. Takeshi Teasdale M.D.

## 2015-06-15 NOTE — Patient Instructions (Signed)
Chest is clear today This appears to be a viral respiratory infection  Will cough for another week but if gets fever shortness of breath or chest pain . Contact health team again .  Cough med in day ok and can try cough med hydrocodeine at night .  For comfort .

## 2015-06-15 NOTE — Assessment & Plan Note (Signed)
Lipid panel recently checked - well controlled Continue simvastatin 40 mg daily

## 2015-06-15 NOTE — Assessment & Plan Note (Signed)
Check tsh  Titrate med dose if needed  

## 2015-06-15 NOTE — Progress Notes (Signed)
Pre visit review using our clinic review tool, if applicable. No additional management support is needed unless otherwise documented below in the visit note. 

## 2015-06-15 NOTE — Assessment & Plan Note (Signed)
Check B12 level. 

## 2015-06-17 NOTE — Telephone Encounter (Signed)
Rx(s) sent to pharmacy electronically.  

## 2015-06-19 ENCOUNTER — Encounter: Payer: Self-pay | Admitting: Emergency Medicine

## 2015-07-01 ENCOUNTER — Other Ambulatory Visit: Payer: Self-pay | Admitting: Internal Medicine

## 2015-07-12 ENCOUNTER — Ambulatory Visit (INDEPENDENT_AMBULATORY_CARE_PROVIDER_SITE_OTHER): Payer: Medicare Other | Admitting: Podiatry

## 2015-07-12 ENCOUNTER — Encounter: Payer: Self-pay | Admitting: Neurology

## 2015-07-12 ENCOUNTER — Ambulatory Visit (INDEPENDENT_AMBULATORY_CARE_PROVIDER_SITE_OTHER): Payer: Medicare Other | Admitting: Neurology

## 2015-07-12 ENCOUNTER — Ambulatory Visit: Payer: Medicare Other | Admitting: Neurology

## 2015-07-12 ENCOUNTER — Encounter: Payer: Self-pay | Admitting: Podiatry

## 2015-07-12 VITALS — BP 146/62 | HR 86 | Ht 63.75 in | Wt 170.0 lb

## 2015-07-12 DIAGNOSIS — E1151 Type 2 diabetes mellitus with diabetic peripheral angiopathy without gangrene: Secondary | ICD-10-CM

## 2015-07-12 DIAGNOSIS — I2510 Atherosclerotic heart disease of native coronary artery without angina pectoris: Secondary | ICD-10-CM | POA: Diagnosis not present

## 2015-07-12 DIAGNOSIS — E1142 Type 2 diabetes mellitus with diabetic polyneuropathy: Secondary | ICD-10-CM | POA: Diagnosis not present

## 2015-07-12 DIAGNOSIS — H9193 Unspecified hearing loss, bilateral: Secondary | ICD-10-CM | POA: Diagnosis not present

## 2015-07-12 DIAGNOSIS — R42 Dizziness and giddiness: Secondary | ICD-10-CM | POA: Diagnosis not present

## 2015-07-12 DIAGNOSIS — B351 Tinea unguium: Secondary | ICD-10-CM | POA: Diagnosis not present

## 2015-07-12 DIAGNOSIS — I951 Orthostatic hypotension: Secondary | ICD-10-CM

## 2015-07-12 DIAGNOSIS — M79676 Pain in unspecified toe(s): Secondary | ICD-10-CM | POA: Diagnosis not present

## 2015-07-12 NOTE — Progress Notes (Signed)
Patient ID: Sheila Morgan, female   DOB: Jun 07, 1932, 82 y.o.   MRN: [id number] Complaint:  Visit Type: Patient returns to my office for continued preventative foot care services. Complaint: Patient states" my nails have grown long and thick and become painful to walk and wear shoes" Patient has been diagnosed with DM with no foot complications. The patient presents for preventative foot care services. No changes to ROS  Podiatric Exam: Vascular: dorsalis pedis  are palpable bilateral  Posterior tibial pulses are absent B/L.Marland Kitchen Capillary return is immediate. Temperature gradient is WNL. Skin turgor WNL  Sensorium: Normal Semmes Weinstein monofilament test. Normal tactile sensation bilaterally. Nail Exam: Pt has thick disfigured discolored nails with subungual debris noted bilateral entire nail hallux through fifth toenails Ulcer Exam: There is no evidence of ulcer or pre-ulcerative changes or infection. Orthopedic Exam: Muscle tone and strength are WNL. No limitations in general ROM. No crepitus or effusions noted. Foot type and digits show no abnormalities. Bony prominences are unremarkable. Skin: No Porokeratosis. No infection or ulcers  Diagnosis:  Onychomycosis, , Pain in right toe, pain in left toes  Treatment & Plan Procedures and Treatment: Consent by patient was obtained for treatment procedures. The patient understood the discussion of treatment and procedures well. All questions were answered thoroughly reviewed. Debridement of mycotic and hypertrophic toenails, 1 through 5 bilateral and clearing of subungual debris. No ulceration, no infection noted.  Return Visit-Office Procedure: Patient instructed to return to the office for a follow up visit 3 months for continued evaluation and treatment.  Gardiner Barefoot DPM

## 2015-07-12 NOTE — Progress Notes (Signed)
NEUROLOGY CONSULTATION NOTE  Sheila Morgan MRN: [id number] DOB: Mar 16, 1933  Referring provider: Dr. Quay Burow Primary care provider: Dr. Quay Burow  Reason for consult:  vertigo  HISTORY OF PRESENT ILLNESS: Sheila Morgan is an 82 year old right-handed female with type 2 diabetes, HTN, CAD, hyperlipidemia, osteoporosis, and chronic kidney disease who presents for longstanding history of dizziness.  History obtained by patient, her daughter, ENT and PCP notes.  Labs reviewed.  She has had persistent vertigo for 3 years.  It is a constant sensation of movement in her head.  She doesn't feel like she is going to pass out.  There is no associated nausea, double vision, unilateral facial numbness or weakness.  It is exacerbated with any type of movement, but it is always present.  She feels unsteady on her feet because of it.  She had one fall 2 years ago, but not since.  She uses a cane for support.  She also has ear pain in the left ear.  She saw ENT in 2015.  Audiometric testing revealed mild to moderately severe bilateral sensorineural hearing loss.  No explanation was found.  She has tried meclizine and vestibular rehab, which have been ineffective.  Recent labs:  Hgb A1c 6.7%, B12 greater than 1500, TSH 2.13.  CMP normal.  PAST MEDICAL HISTORY: Past Medical History  Diagnosis Date  . Hyperlipidemia   . Hypertension   . Diabetes mellitus     2  . Arthritis   . Hiatal hernia   . Hemorrhoids   . Osteoporosis   . PONV (postoperative nausea and vomiting)   . Coronary artery disease     status post post LAD angioplasty in 1993  and subsequent coronary artery bypass grafting x1 with a LIMA to the LAD July of 1994. She had stenting of her RCA with a bare-metal stent November 2002.  Marland Kitchen Left knee DJD   . Lightheadedness     has seen Dr Gwenlyn Found and ENT.. cant find out why  . Heart murmur   . Cancer ([place])     skin on nose .  mylenoma  . History of blood transfusion   . HOH (hard of hearing)   .  Hematoma     Right groin  . Gout     PAST SURGICAL HISTORY: Past Surgical History  Procedure Laterality Date  . Coronary angioplasty with stent placement  03/09/01    RCA  . Total hip arthroplasty    . Coronary artery bypass graft  1994  . Joint replacement Right 1998    hip  . Skin graft      to nose skin cancer  . Total knee arthroplasty Left 10/09/2013    Procedure: TOTAL KNEE ARTHROPLASTY;  Surgeon: Lorn Junes, MD;  Location: [facility];  Service: Orthopedics;  Laterality: Left;  . Coronary angioplasty  1993    LAD  . Nm myoview ltd  03/2012    Normal    MEDICATIONS: Current Outpatient Prescriptions on File Prior to Visit  Medication Sig Dispense Refill  . alendronate (FOSAMAX) 70 MG tablet Take 70 mg by mouth once a week. Take with a full glass of water on an empty stomach.    Marland Kitchen amLODipine (NORVASC) 5 MG tablet Take 1 tablet (5 mg total) by mouth daily. Must estab w/new provider for future refills 90 tablet 0  . aspirin 81 MG tablet Take 81 mg by mouth daily.    . Blood Glucose Monitoring Suppl (ONE TOUCH ULTRA MINI) W/DEVICE  KIT Use to test blood sugar twice daily ICD 10 E11 21 1 each 0  . Calcium Carbonate-Vitamin D (CALCIUM 600-D PO) Take 1 tablet by mouth 2 (two) times daily.     . carvedilol (COREG) 25 MG tablet Take 1 tablet (25 mg total) by mouth 2 (two) times daily with a meal. 60 tablet 5  . colchicine (COLCRYS) 0.6 MG tablet Take 0.6 mg by mouth daily.    . furosemide (LASIX) 20 MG tablet Take 1 tablet (20 mg total) by mouth daily. 30 tablet 5  . glucose blood (ONE TOUCH ULTRA TEST) test strip USE TO TEST SUGARS TWICE DAILY E11.9 100 each 5  . isosorbide mononitrate (IMDUR) 30 MG 24 hr tablet Take 0.5 tablets (15 mg total) by mouth daily. 15 tablet 6  . Lancets (ONETOUCH ULTRASOFT) lancets TEST BLOOD SUGAR TWICE DAILY ICD 10 CODE E11 21 100 each 5  . levothyroxine (SYNTHROID, LEVOTHROID) 25 MCG tablet Take 1 tablet (25 mcg total) by mouth daily before breakfast. Must  keep 06/13/15 appt for future refills 30 tablet 0  . losartan (COZAAR) 100 MG tablet TAKE 1 TABLET(100 MG) BY MOUTH DAILY 90 tablet 1  . metFORMIN (GLUCOPHAGE) 500 MG tablet 1/2 daily with largest meal 45 tablet 0  . Multiple Vitamin (MULITIVITAMIN WITH MINERALS) TABS Take 1 tablet by mouth daily.    . predniSONE (DELTASONE) 5 MG tablet Take 5 mg by mouth daily.    . simvastatin (ZOCOR) 40 MG tablet TAKE 1 TABLET(40 MG) BY MOUTH EVERY EVENING 90 tablet 3  . vitamin B-12 (CYANOCOBALAMIN) 1000 MCG tablet Take 1,000 mcg by mouth daily.       No current facility-administered medications on file prior to visit.    ALLERGIES: No Known Allergies  FAMILY HISTORY: Family History  Problem Relation Age of Onset  . Heart disease Father   . Cancer Sister     breast  . Diabetes Sister   . Heart disease Brother   . Heart disease Brother   . Heart disease Brother   . Diabetes Sister     SOCIAL HISTORY: Social History   Social History  . Marital Status: Married    Spouse Name: N/A  . Number of Children: N/A  . Years of Education: N/A   Occupational History  . Not on file.   Social History Main Topics  . Smoking status: Never Smoker   . Smokeless tobacco: Never Used  . Alcohol Use: No  . Drug Use: No  . Sexual Activity: Not Currently   Other Topics Concern  . Not on file   Social History Narrative    REVIEW OF SYSTEMS: Constitutional: No fevers, chills, or sweats, no generalized fatigue, change in appetite Eyes: No visual changes, double vision, eye pain Ear, nose and throat: No hearing loss, ear pain, nasal congestion, sore throat Cardiovascular: No chest pain, palpitations Respiratory:  No shortness of breath at rest or with exertion, wheezes GastrointestinaI: No nausea, vomiting, diarrhea, abdominal pain, fecal incontinence Genitourinary:  No dysuria, urinary retention or frequency Musculoskeletal:  No neck pain, back pain Integumentary: No rash, pruritus, skin  lesions Neurological: as above Psychiatric: No depression, insomnia, anxiety Endocrine: No palpitations, fatigue, diaphoresis, mood swings, change in appetite, change in weight, increased thirst Hematologic/Lymphatic:  No anemia, purpura, petechiae. Allergic/Immunologic: no itchy/runny eyes, nasal congestion, recent allergic reactions, rashes  PHYSICAL EXAM: Filed Vitals:   07/12/15 1426 07/12/15 1427  BP: 152/74 146/62  Pulse: 81 86  Orthostatics: Blood Pressure  Pulse Supine: 150/80      85 Sitting: 152/74      81 Standing: 146/62      80 General: No acute distress.  Patient appears well-groomed.  Head:  Normocephalic/atraumatic Eyes:  fundi unremarkable, without vessel changes, exudates, hemorrhages or papilledema. Neck: supple, no paraspinal tenderness, full range of motion Back: No paraspinal tenderness Heart: regular rate and rhythm Lungs: Clear to auscultation bilaterally. Vascular: No carotid bruits. Neurological Exam: Mental status: alert and oriented to person, place, and time, recent and remote memory intact, fund of knowledge intact, attention and concentration intact, speech fluent and not dysarthric, language intact. Cranial nerves: CN I: not tested CN II: pupils equal, round and reactive to light, visual fields intact, fundi unremarkable, without vessel changes, exudates, hemorrhages or papilledema. CN III, IV, VI:  full range of motion, no nystagmus, no ptosis CN V: facial sensation intact CN VII: upper and lower face symmetric CN VIII: hearing intact to voice CN IX, X: gag intact, uvula midline CN XI: sternocleidomastoid and trapezius muscles intact CN XII: tongue midline Bulk & Tone: normal, no fasciculations. Motor:  5/5 throughout  Sensation:  Temperature sensation intact.  Decreased vibratory sensation in feet. Deep Tendon Reflexes:  Trace in upper extremities, absent in lower extremities, toes downgoing.  Finger to nose testing:  Without dysmetria.   Heel to shin:  Without dysmetria.   Gait:  Wide-based, cautious.  Able to turn, unable to tandem walk. Romberg with sway.  IMPRESSION: Vertigo/disequilibrium Type 2 diabetes with diabetic neuropathy Orthostatic hypotension  It is not garden variety benign paroxysmal positional vertigo.  She did meet criteria for orthostatic hypotension based on change in diastolic pressure.  Other considerations include cerebrovascular disease involving the vertebrobasilar system, dysautonomia secondary to diabetes, or pharmacologic.  PLAN: 1.  We will get MRI of brain to look for any posterior fossa lesion.  Will contact patient with results and further recommendations 2.  Otherwise, it would be symptomatic treatment  Thank you for allowing me to take part in the care of this patient.  Metta Clines, DO  CC:  Billey Gosling, MD

## 2015-07-12 NOTE — Patient Instructions (Signed)
We will check MRI of the brain to evaluate for persistent vertigo

## 2015-07-21 ENCOUNTER — Ambulatory Visit
Admission: RE | Admit: 2015-07-21 | Discharge: 2015-07-21 | Disposition: A | Discharge status: Home / Self Care | Payer: Medicare Other | Source: Ambulatory Visit | Attending: Neurology | Admitting: Neurology

## 2015-07-21 DIAGNOSIS — R42 Dizziness and giddiness: Secondary | ICD-10-CM

## 2015-07-22 ENCOUNTER — Telehealth: Payer: Self-pay

## 2015-07-22 NOTE — Telephone Encounter (Signed)
-----   Message from Pieter Partridge, DO sent at 07/22/2015  7:13 AM EDT ----- MRI of brain shows no specific cause of vertigo.  Unfortunately, I have no further recommendations to treat the vertigo other than what is already suggested.

## 2015-07-22 NOTE — Telephone Encounter (Signed)
Message relayed to patient. Verbalized understanding and denied questions.   

## 2015-08-01 DIAGNOSIS — H2511 Age-related nuclear cataract, right eye: Secondary | ICD-10-CM | POA: Diagnosis not present

## 2015-08-06 ENCOUNTER — Other Ambulatory Visit: Payer: Self-pay | Admitting: Internal Medicine

## 2015-08-21 DIAGNOSIS — H25811 Combined forms of age-related cataract, right eye: Secondary | ICD-10-CM | POA: Diagnosis not present

## 2015-08-21 DIAGNOSIS — H2511 Age-related nuclear cataract, right eye: Secondary | ICD-10-CM | POA: Diagnosis not present

## 2015-08-23 DIAGNOSIS — Z79899 Other long term (current) drug therapy: Secondary | ICD-10-CM | POA: Diagnosis not present

## 2015-08-23 DIAGNOSIS — M150 Primary generalized (osteo)arthritis: Secondary | ICD-10-CM | POA: Diagnosis not present

## 2015-09-02 ENCOUNTER — Other Ambulatory Visit: Payer: Self-pay

## 2015-09-02 ENCOUNTER — Other Ambulatory Visit: Payer: Self-pay | Admitting: Internal Medicine

## 2015-09-02 MED ORDER — AMLODIPINE BESYLATE 5 MG PO TABS: 5.0000 mg | ORAL_TABLET | Freq: Every day | ORAL | Status: DC

## 2015-09-04 DIAGNOSIS — M150 Primary generalized (osteo)arthritis: Secondary | ICD-10-CM | POA: Diagnosis not present

## 2015-09-04 DIAGNOSIS — M810 Age-related osteoporosis without current pathological fracture: Secondary | ICD-10-CM | POA: Diagnosis not present

## 2015-09-04 DIAGNOSIS — M1189 Other specified crystal arthropathies, multiple sites: Secondary | ICD-10-CM | POA: Diagnosis not present

## 2015-09-07 ENCOUNTER — Other Ambulatory Visit: Payer: Self-pay | Admitting: Internal Medicine

## 2015-09-26 ENCOUNTER — Other Ambulatory Visit: Payer: Self-pay | Admitting: Internal Medicine

## 2015-09-26 NOTE — Telephone Encounter (Signed)
Left msg on triage stating mom is needing refill on her furosemide we are currently out of town in [place]. Requesting refill to be sent to [facility] there # [phone]...Johny Chess

## 2015-10-11 ENCOUNTER — Ambulatory Visit (INDEPENDENT_AMBULATORY_CARE_PROVIDER_SITE_OTHER): Payer: Medicare Other | Admitting: Podiatry

## 2015-10-11 ENCOUNTER — Encounter: Payer: Self-pay | Admitting: Podiatry

## 2015-10-11 DIAGNOSIS — B351 Tinea unguium: Secondary | ICD-10-CM

## 2015-10-11 DIAGNOSIS — E1151 Type 2 diabetes mellitus with diabetic peripheral angiopathy without gangrene: Secondary | ICD-10-CM

## 2015-10-11 DIAGNOSIS — M79676 Pain in unspecified toe(s): Secondary | ICD-10-CM

## 2015-10-11 NOTE — Progress Notes (Signed)
Patient ID: Sheila Morgan, female   DOB: [birth year], 82 y.o.   MRN: [id number] Complaint:  Visit Type: Patient returns to my office for continued preventative foot care services. Complaint: Patient states" my nails have grown long and thick and become painful to walk and wear shoes" Patient has been diagnosed with DM with no foot complications. The patient presents for preventative foot care services. No changes to ROS  Podiatric Exam: Vascular: dorsalis pedis  are palpable bilateral  Posterior tibial pulses are absent B/L.Marland Kitchen Capillary return is immediate. Temperature gradient is WNL. Skin turgor WNL  Sensorium: Normal Semmes Weinstein monofilament test. Normal tactile sensation bilaterally. Nail Exam: Pt has thick disfigured discolored nails with subungual debris noted bilateral entire nail hallux through fifth toenails Ulcer Exam: There is no evidence of ulcer or pre-ulcerative changes or infection. Orthopedic Exam: Muscle tone and strength are WNL. No limitations in general ROM. No crepitus or effusions noted. Foot type and digits show no abnormalities. Bony prominences are unremarkable. Skin: No Porokeratosis. No infection or ulcers  Diagnosis:  Onychomycosis, , Pain in right toe, pain in left toes  Treatment & Plan Procedures and Treatment: Consent by patient was obtained for treatment procedures. The patient understood the discussion of treatment and procedures well. All questions were answered thoroughly reviewed. Debridement of mycotic and hypertrophic toenails, 1 through 5 bilateral and clearing of subungual debris. No ulceration, no infection noted.  Return Visit-Office Procedure: Patient instructed to return to the office for a follow up visit 3 months for continued evaluation and treatment.  Gardiner Barefoot DPM

## 2015-10-23 ENCOUNTER — Other Ambulatory Visit: Payer: Self-pay | Admitting: Internal Medicine

## 2015-11-07 ENCOUNTER — Other Ambulatory Visit: Payer: Self-pay | Admitting: Cardiovascular Disease

## 2015-11-07 NOTE — Telephone Encounter (Signed)
Rx(s) sent to pharmacy electronically.  

## 2015-11-08 ENCOUNTER — Other Ambulatory Visit: Payer: Self-pay | Admitting: Internal Medicine

## 2015-12-11 ENCOUNTER — Ambulatory Visit: Payer: Medicare Other | Admitting: Internal Medicine

## 2015-12-11 ENCOUNTER — Ambulatory Visit: Payer: Medicare Other

## 2015-12-20 ENCOUNTER — Other Ambulatory Visit: Payer: Self-pay | Admitting: Internal Medicine

## 2015-12-23 ENCOUNTER — Other Ambulatory Visit (INDEPENDENT_AMBULATORY_CARE_PROVIDER_SITE_OTHER): Payer: Medicare Other

## 2015-12-23 ENCOUNTER — Ambulatory Visit (INDEPENDENT_AMBULATORY_CARE_PROVIDER_SITE_OTHER): Payer: Medicare Other | Admitting: Internal Medicine

## 2015-12-23 ENCOUNTER — Encounter: Payer: Self-pay | Admitting: Internal Medicine

## 2015-12-23 VITALS — BP 162/90 | HR 76 | Temp 98.0°F | Ht 66.7 in | Wt 175.2 lb

## 2015-12-23 DIAGNOSIS — E1151 Type 2 diabetes mellitus with diabetic peripheral angiopathy without gangrene: Secondary | ICD-10-CM

## 2015-12-23 DIAGNOSIS — Z0000 Encounter for general adult medical examination without abnormal findings: Secondary | ICD-10-CM | POA: Diagnosis not present

## 2015-12-23 DIAGNOSIS — E1122 Type 2 diabetes mellitus with diabetic chronic kidney disease: Secondary | ICD-10-CM

## 2015-12-23 DIAGNOSIS — I10 Essential (primary) hypertension: Secondary | ICD-10-CM | POA: Diagnosis not present

## 2015-12-23 DIAGNOSIS — I2510 Atherosclerotic heart disease of native coronary artery without angina pectoris: Secondary | ICD-10-CM

## 2015-12-23 DIAGNOSIS — Z23 Encounter for immunization: Secondary | ICD-10-CM

## 2015-12-23 DIAGNOSIS — E038 Other specified hypothyroidism: Secondary | ICD-10-CM

## 2015-12-23 DIAGNOSIS — N289 Disorder of kidney and ureter, unspecified: Secondary | ICD-10-CM

## 2015-12-23 LAB — CBC WITH DIFFERENTIAL/PLATELET
BASOS PCT: 0.4 % (ref 0.0–3.0)
Basophils Absolute: 0 10*3/uL (ref 0.0–0.1)
EOS ABS: 0.1 10*3/uL (ref 0.0–0.7)
Eosinophils Relative: 1.1 % (ref 0.0–5.0)
HEMATOCRIT: 41 % (ref 36.0–46.0)
Hemoglobin: 13.7 g/dL (ref 12.0–15.0)
LYMPHS ABS: 2.1 10*3/uL (ref 0.7–4.0)
Lymphocytes Relative: 21.8 % (ref 12.0–46.0)
MCHC: 33.5 g/dL (ref 30.0–36.0)
MCV: 89.4 fl (ref 78.0–100.0)
MONO ABS: 1.3 10*3/uL — AB (ref 0.1–1.0)
MONOS PCT: 12.8 % — AB (ref 3.0–12.0)
NEUTROS ABS: 6.3 10*3/uL (ref 1.4–7.7)
NEUTROS PCT: 63.9 % (ref 43.0–77.0)
PLATELETS: 160 10*3/uL (ref 150.0–400.0)
RBC: 4.58 Mil/uL (ref 3.87–5.11)
RDW: 15.3 % (ref 11.5–15.5)
WBC: 9.9 10*3/uL (ref 4.0–10.5)

## 2015-12-23 LAB — COMPREHENSIVE METABOLIC PANEL
ALBUMIN: 4.2 g/dL (ref 3.5–5.2)
ALK PHOS: 83 U/L (ref 39–117)
ALT: 28 U/L (ref 0–35)
AST: 23 U/L (ref 0–37)
BILIRUBIN TOTAL: 0.6 mg/dL (ref 0.2–1.2)
BUN: 14 mg/dL (ref 6–23)
CO2: 33 mEq/L — ABNORMAL HIGH (ref 19–32)
Calcium: 9.8 mg/dL (ref 8.4–10.5)
Chloride: 102 mEq/L (ref 96–112)
Creatinine, Ser: 1.09 mg/dL (ref 0.40–1.20)
GFR: 61.92 mL/min (ref >60.00)
GLUCOSE: 87 mg/dL (ref 70–99)
POTASSIUM: 3.6 meq/L (ref 3.5–5.1)
SODIUM: 143 meq/L (ref 135–145)
TOTAL PROTEIN: 7 g/dL (ref 6.0–8.3)

## 2015-12-23 LAB — TSH: TSH: 3.25 u[IU]/mL (ref 0.35–4.50)

## 2015-12-23 LAB — HEMOGLOBIN A1C: HEMOGLOBIN A1C: 6.9 % — AB (ref 4.6–6.5)

## 2015-12-23 NOTE — Assessment & Plan Note (Signed)
Sugars well-controlled Check A1c She is on chronic prednisone, which may be elevating her sugar slightly She denies hypoglycemia or sugars less than 100 Continue metformin 500 mg daily Discussed monitor for low sugars closely-if she gets sugars less than 100 we may need to discontinue metformin
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
## 2015-12-23 NOTE — Assessment & Plan Note (Signed)
Check tsh  Titrate med dose if needed  

## 2015-12-23 NOTE — Assessment & Plan Note (Signed)
Check CMP.  ?

## 2015-12-23 NOTE — Patient Instructions (Addendum)
Sheila Morgan , Thank you for taking time to come for your Medicare Wellness Visit. I appreciate your ongoing commitment to your health goals. Please review the following plan we discussed and let me know if I can assist you in the future.   Adult [place], [place]  [phone]  [place], [place]  [phone]  [facility]; [place]; [phone]  Deaf & Hard of Hearing Division Services - do pay for one hearing aid No reviews  [facility]  [place] [address]  [phone]  Education provided;   Lifeline: http://www.lifelinesys.com/content/home; 873-020-2626 x2102   Education provided;   SAFETY Generalized Safety in the home reviewed/  Removing clutter and tripping hazzards, railings on stairs and grab bars in the bathroom; good lighting; annual eye exam; review of medications  Falls assessed; mobility changes; balance changes; up at hs to the bathroom; medications;    Home Safety Tips for Older Adults given   Lifeline: http://www.lifelinesys.com/content/home; [phone] [phone]     These are the goals we discussed: Goals    . patient          Exercise; discussed weight bearing; walking and using small weights to strengthen arms Walk, walk, walk         This is a list of the screening recommended for you and due dates:  Health Maintenance  Topic Date Due  . Shingles Vaccine  09/18/1994  . DEXA scan (bone density measurement)  09/18/1999  . Eye exam for diabetics  09/20/2015  . Flu Shot  12/03/2015  . Hemoglobin A1C  12/11/2015  . Complete foot exam   10/10/2016  . Tetanus Vaccine  01/08/2020  . Pneumonia vaccines  Completed         Fall Prevention in the Home  Falls can cause injuries. They can happen to people of all ages. There are many things you can do to make your home safe and to help prevent falls.  WHAT CAN I DO ON THE OUTSIDE OF  MY HOME?  Regularly fix the edges of walkways and driveways and fix any cracks.  Remove anything that might make you trip as you walk through a door, such as a raised step or threshold.  Trim any bushes or trees on the path to your home.  Use bright outdoor lighting.  Clear any walking paths of anything that might make someone trip, such as rocks or tools.  Regularly check to see if handrails are loose or broken. Make sure that both sides of any steps have handrails.  Any raised decks and porches should have guardrails on the edges.  Have any leaves, snow, or ice cleared regularly.  Use sand or salt on walking paths during winter.  Clean up any spills in your garage right away. This includes oil or grease spills. WHAT CAN I DO IN THE BATHROOM?   Use night lights.  Install grab bars by the toilet and in the tub and shower. Do not use towel bars as grab bars.  Use non-skid mats or decals in the tub or shower.  If you need to sit down in the shower, use a plastic, non-slip stool.  Keep the floor dry. Clean up any water that spills on the floor as soon as it happens.  Remove soap buildup in the tub or shower regularly.  Attach bath mats securely with double-sided non-slip rug tape.  Do not have  throw rugs and other things on the floor that can make you trip. WHAT CAN I DO IN THE BEDROOM?  Use night lights.  Make sure that you have a light by your bed that is easy to reach.  Do not use any sheets or blankets that are too big for your bed. They should not hang down onto the floor.  Have a firm chair that has side arms. You can use this for support while you get dressed.  Do not have throw rugs and other things on the floor that can make you trip. WHAT CAN I DO IN THE KITCHEN?  Clean up any spills right away.  Avoid walking on wet floors.  Keep items that you use a lot in easy-to-reach places.  If you need to reach something above you, use a strong step stool that  has a grab bar.  Keep electrical cords out of the way.  Do not use floor polish or wax that makes floors slippery. If you must use wax, use non-skid floor wax.  Do not have throw rugs and other things on the floor that can make you trip. WHAT CAN I DO WITH MY STAIRS?  Do not leave any items on the stairs.  Make sure that there are handrails on both sides of the stairs and use them. Fix handrails that are broken or loose. Make sure that handrails are as long as the stairways.  Check any carpeting to make sure that it is firmly attached to the stairs. Fix any carpet that is loose or worn.  Avoid having throw rugs at the top or bottom of the stairs. If you do have throw rugs, attach them to the floor with carpet tape.  Make sure that you have a light switch at the top of the stairs and the bottom of the stairs. If you do not have them, ask someone to add them for you. WHAT ELSE CAN I DO TO HELP PREVENT FALLS?  Wear shoes that:  Do not have high heels.  Have rubber bottoms.  Are comfortable and fit you well.  Are closed at the toe. Do not wear sandals.  If you use a stepladder:  Make sure that it is fully opened. Do not climb a closed stepladder.  Make sure that both sides of the stepladder are locked into place.  Ask someone to hold it for you, if possible.  Clearly mark and make sure that you can see:  Any grab bars or handrails.  First and last steps.  Where the edge of each step is.  Use tools that help you move around (mobility aids) if they are needed. These include:  Canes.  Walkers.  Scooters.  Crutches.  Turn on the lights when you go into a dark area. Replace any light bulbs as soon as they burn out.  Set up your furniture so you have a clear path. Avoid moving your furniture around.  If any of your floors are uneven, fix them.  If there are any pets around you, be aware of where they are.  Review your medicines with your doctor. Some medicines  can make you feel dizzy. This can increase your chance of falling. Ask your doctor what other things that you can do to help prevent falls.   This information is not intended to replace advice given to you by your health care provider. Make sure you discuss any questions you have with your health care provider.   Document Released: 02/14/2009 Document  Revised: 09/04/2014 Document Reviewed: 05/25/2014 Elsevier Interactive Patient Education 2016 [place] Maintenance, Female Adopting a healthy lifestyle and getting preventive care can go a long way to promote health and wellness. Talk with your health care provider about what schedule of regular examinations is right for you. This is a good chance for you to check in with your provider about disease prevention and staying healthy. In between checkups, there are plenty of things you can do on your own. Experts have done a lot of research about which lifestyle changes and preventive measures are most likely to keep you healthy. Ask your health care provider for more information. WEIGHT AND DIET  Eat a healthy diet  Be sure to include plenty of vegetables, fruits, low-fat dairy products, and lean protein.  Do not eat a lot of foods high in solid fats, added sugars, or salt.  Get regular exercise. This is one of the most important things you can do for your health.  Most adults should exercise for at least 150 minutes each week. The exercise should increase your heart rate and make you sweat (moderate-intensity exercise).  Most adults should also do strengthening exercises at least twice a week. This is in addition to the moderate-intensity exercise.  Maintain a healthy weight  Body mass index (BMI) is a measurement that can be used to identify possible weight problems. It estimates body fat based on height and weight. Your health care provider can help determine your BMI and help you achieve or maintain a healthy weight.  For  females 54 years of age and older:   A BMI below 18.5 is considered underweight.  A BMI of 18.5 to 24.9 is normal.  A BMI of 25 to 29.9 is considered overweight.  A BMI of 30 and above is considered obese.  Watch levels of cholesterol and blood lipids  You should start having your blood tested for lipids and cholesterol at 80 years of age, then have this test every 5 years.  You may need to have your cholesterol levels checked more often if:  Your lipid or cholesterol levels are high.  You are older than 80 years of age.  You are at high risk for heart disease.  CANCER SCREENING   Lung Cancer  Lung cancer screening is recommended for adults 78-23 years old who are at high risk for lung cancer because of a history of smoking.  A yearly low-dose CT scan of the lungs is recommended for people who:  Currently smoke.  Have quit within the past 15 years.  Have at least a 30-pack-year history of smoking. A pack year is smoking an average of one pack of cigarettes a day for 1 year.  Yearly screening should continue until it has been 15 years since you quit.  Yearly screening should stop if you develop a health problem that would prevent you from having lung cancer treatment.  Breast Cancer  Practice breast self-awareness. This means understanding how your breasts normally appear and feel.  It also means doing regular breast self-exams. Let your health care provider know about any changes, no matter how small.  If you are in your 20s or 30s, you should have a clinical breast exam (CBE) by a health care provider every 1-3 years as part of a regular health exam.  If you are 79 or older, have a CBE every year. Also consider having a breast X-ray (mammogram) every year.  If you have a family history of breast  cancer, talk to your health care provider about genetic screening.  If you are at high risk for breast cancer, talk to your health care provider about having an MRI and  a mammogram every year.  Breast cancer gene (BRCA) assessment is recommended for women who have family members with BRCA-related cancers. BRCA-related cancers include:  Breast.  Ovarian.  Tubal.  Peritoneal cancers.  Results of the assessment will determine the need for genetic counseling and BRCA1 and BRCA2 testing. Cervical Cancer Your health care provider may recommend that you be screened regularly for cancer of the pelvic organs (ovaries, uterus, and vagina). This screening involves a pelvic examination, including checking for microscopic changes to the surface of your cervix (Pap test). You may be encouraged to have this screening done every 3 years, beginning at age 34.  For women ages 19-65, health care providers may recommend pelvic exams and Pap testing every 3 years, or they may recommend the Pap and pelvic exam, combined with testing for human papilloma virus (HPV), every 5 years. Some types of HPV increase your risk of cervical cancer. Testing for HPV may also be done on women of any age with unclear Pap test results.  Other health care providers may not recommend any screening for nonpregnant women who are considered low risk for pelvic cancer and who do not have symptoms. Ask your health care provider if a screening pelvic exam is right for you.  If you have had past treatment for cervical cancer or a condition that could lead to cancer, you need Pap tests and screening for cancer for at least 20 years after your treatment. If Pap tests have been discontinued, your risk factors (such as having a new sexual partner) need to be reassessed to determine if screening should resume. Some women have medical problems that increase the chance of getting cervical cancer. In these cases, your health care provider may recommend more frequent screening and Pap tests. Colorectal Cancer  This type of cancer can be detected and often prevented.  Routine colorectal cancer screening usually  begins at 80 years of age and continues through 80 years of age.  Your health care provider may recommend screening at an earlier age if you have risk factors for colon cancer.  Your health care provider may also recommend using home test kits to check for hidden blood in the stool.  A small camera at the end of a tube can be used to examine your colon directly (sigmoidoscopy or colonoscopy). This is done to check for the earliest forms of colorectal cancer.  Routine screening usually begins at age 86.  Direct examination of the colon should be repeated every 5-10 years through 80 years of age. However, you may need to be screened more often if early forms of precancerous polyps or small growths are found. Skin Cancer  Check your skin from head to toe regularly.  Tell your health care provider about any new moles or changes in moles, especially if there is a change in a mole's shape or color.  Also tell your health care provider if you have a mole that is larger than the size of a pencil eraser.  Always use sunscreen. Apply sunscreen liberally and repeatedly throughout the day.  Protect yourself by wearing long sleeves, pants, a wide-brimmed hat, and sunglasses whenever you are outside. HEART DISEASE, DIABETES, AND HIGH BLOOD PRESSURE   High blood pressure causes heart disease and increases the risk of stroke. High blood pressure is more likely  to develop in:  People who have blood pressure in the high end of the normal range (130-139/85-89 mm Hg).  People who are overweight or obese.  People who are African American.  If you are 39-20 years of age, have your blood pressure checked every 3-5 years. If you are 66 years of age or older, have your blood pressure checked every year. You should have your blood pressure measured twice--once when you are at a hospital or clinic, and once when you are not at a hospital or clinic. Record the average of the two measurements. To check your blood  pressure when you are not at a hospital or clinic, you can use:  An automated blood pressure machine at a pharmacy.  A home blood pressure monitor.  If you are between 30 years and 17 years old, ask your health care provider if you should take aspirin to prevent strokes.  Have regular diabetes screenings. This involves taking a blood sample to check your fasting blood sugar level.  If you are at a normal weight and have a low risk for diabetes, have this test once every three years after 80 years of age.  If you are overweight and have a high risk for diabetes, consider being tested at a younger age or more often. PREVENTING INFECTION  Hepatitis B  If you have a higher risk for hepatitis B, you should be screened for this virus. You are considered at high risk for hepatitis B if:  You were born in a country where hepatitis B is common. Ask your health care provider which countries are considered high risk.  Your parents were born in a high-risk country, and you have not been immunized against hepatitis B (hepatitis B vaccine).  You have HIV or AIDS.  You use needles to inject street drugs.  You live with someone who has hepatitis B.  You have had sex with someone who has hepatitis B.  You get hemodialysis treatment.  You take certain medicines for conditions, including cancer, organ transplantation, and autoimmune conditions. Hepatitis C  Blood testing is recommended for:  Everyone born from [birth year] through 1963.  Anyone with known risk factors for hepatitis C. Sexually transmitted infections (STIs)  You should be screened for sexually transmitted infections (STIs) including gonorrhea and chlamydia if:  You are sexually active and are younger than 80 years of age.  You are older than 80 years of age and your health care provider tells you that you are at risk for this type of infection.  Your sexual activity has changed since you were last screened and you are at an  increased risk for chlamydia or gonorrhea. Ask your health care provider if you are at risk.  If you do not have HIV, but are at risk, it may be recommended that you take a prescription medicine daily to prevent HIV infection. This is called pre-exposure prophylaxis (PrEP). You are considered at risk if:  You are sexually active and do not regularly use condoms or know the HIV status of your partner(s).  You take drugs by injection.  You are sexually active with a partner who has HIV. Talk with your health care provider about whether you are at high risk of being infected with HIV. If you choose to begin PrEP, you should first be tested for HIV. You should then be tested every 3 months for as long as you are taking PrEP.  PREGNANCY   If you are premenopausal and you may become  pregnant, ask your health care provider about preconception counseling.  If you may become pregnant, take 400 to 800 micrograms (mcg) of folic acid every day.  If you want to prevent pregnancy, talk to your health care provider about birth control (contraception). OSTEOPOROSIS AND MENOPAUSE   Osteoporosis is a disease in which the bones lose minerals and strength with aging. This can result in serious bone fractures. Your risk for osteoporosis can be identified using a bone density scan.  If you are 56 years of age or older, or if you are at risk for osteoporosis and fractures, ask your health care provider if you should be screened.  Ask your health care provider whether you should take a calcium or vitamin D supplement to lower your risk for osteoporosis.  Menopause may have certain physical symptoms and risks.  Hormone replacement therapy may reduce some of these symptoms and risks. Talk to your health care provider about whether hormone replacement therapy is right for you.  HOME CARE INSTRUCTIONS   Schedule regular health, dental, and eye exams.  Stay current with your immunizations.   Do not use any  tobacco products including cigarettes, chewing tobacco, or electronic cigarettes.  If you are pregnant, do not drink alcohol.  If you are breastfeeding, limit how much and how often you drink alcohol.  Limit alcohol intake to no more than 1 drink per day for nonpregnant women. One drink equals 12 ounces of beer, 5 ounces of wine, or 1 ounces of hard liquor.  Do not use street drugs.  Do not share needles.  Ask your health care provider for help if you need support or information about quitting drugs.  Tell your health care provider if you often feel depressed.  Tell your health care provider if you have ever been abused or do not feel safe at home.   This information is not intended to replace advice given to you by your health care provider. Make sure you discuss any questions you have with your health care provider.   Document Released: 11/03/2010 Document Revised: 05/11/2014 Document Reviewed: 03/22/2013 Elsevier Interactive Patient Education [employer].

## 2015-12-23 NOTE — Progress Notes (Signed)
Subjective:    Patient ID: Sheila Morgan, female    DOB: 1933-05-02, 82 y.o.   MRN: [id number]  HPI The patient is here for follow up.  Diabetes: She is taking her medication daily as prescribed. She is compliant with a diabetic diet. She is not exercising regularly. She monitors her sugars and they have been running 125, none less than 100.   Hypertension: She is taking her medication daily. She is compliant with a low sodium diet.  She denies chest pain, palpitations, edema, shortness of breath and regular headaches. She is not exercising regularly.  She does not monitor her blood pressure at home.     Hypothyroidism:  She is taking her medication daily.  She denies any recent changes in energy or weight that are unexplained.   Hyperlipidemia: She is taking her medication daily as prescribed. She is not exercising regularly. Medications and allergies reviewed with patient and updated if appropriate.  Patient Active Problem List   Diagnosis Date Noted  . B12 deficiency 06/13/2015  . Hypothyroidism 06/13/2015  . Non-compliant behavior 03/08/2015  . Hypercalcemia 08/23/2014  . DM (diabetes mellitus) type II controlled peripheral vascular disorder ([place]) 03/06/2014  . Pressure ulcer stage II 02/12/2014  . Loss of weight 02/12/2014  . Shingles 10/27/2013  . Gout 10/15/2013  . Postoperative anemia due to acute blood loss 10/11/2013  . DJD (degenerative joint disease) of knee 10/09/2013  . Cancer ([place])   . Arthritis   . Hiatal hernia   . Coronary artery disease   . Left knee DJD   . Benign paroxysmal positional vertigo 05/19/2013  . Weakness generalized 05/09/2013  . Pseudogout of left knee 03/29/2013  . Acute right lumbar radiculopathy 03/18/2013  . Diabetes mellitus with renal manifestations, controlled ([place]) 03/18/2013  . Shoulder impingement syndrome 01/25/2013  . Vertigo 10/17/2012  . Renal insufficiency, mild 06/20/2011  . Essential hypertension 12/31/2009  .  Osteoporosis 12/31/2009  . SKIN CANCER, HX OF 12/31/2009  . Proteinuria 01/05/2008  . UNSPECIFIED ANEMIA 12/21/2007  . BLOOD IN STOOL, OCCULT 12/21/2007  . Hyperlipidemia 09/08/2006  . Polymyalgia rheumatica ([place]) 09/08/2006  . ALKALINE PHOSPHATASE, ELEVATED 09/08/2006  . CORONARY ARTERY BYPASS GRAFT, HX OF 09/08/2006    Current Outpatient Prescriptions on File Prior to Visit  Medication Sig Dispense Refill  . alendronate (FOSAMAX) 70 MG tablet Take 70 mg by mouth once a week. Take with a full glass of water on an empty stomach.    Marland Kitchen amLODipine (NORVASC) 5 MG tablet Take 1 tablet (5 mg total) by mouth daily. 90 tablet 2  . aspirin 81 MG tablet Take 81 mg by mouth daily.    . Blood Glucose Monitoring Suppl (ONE TOUCH ULTRA MINI) W/DEVICE KIT Use to test blood sugar twice daily ICD 10 E11 21 1 each 0  . Calcium Carbonate-Vitamin D (CALCIUM 600-D PO) Take 1 tablet by mouth 2 (two) times daily.     . carvedilol (COREG) 25 MG tablet TAKE 1 TABLET(25 MG) BY MOUTH TWICE DAILY WITH A MEAL 60 tablet 5  . colchicine (COLCRYS) 0.6 MG tablet Take 0.6 mg by mouth daily.    . furosemide (LASIX) 20 MG tablet TAKE 1 TABLET(20 MG) BY MOUTH DAILY 90 tablet 0  . glucose blood (ONE TOUCH ULTRA TEST) test strip USE TO TEST SUGARS TWICE DAILY E11.9 100 each 5  . isosorbide mononitrate (IMDUR) 30 MG 24 hr tablet Take 0.5 tablets (15 mg total) by mouth daily. 15 tablet 6  .  levothyroxine (SYNTHROID, LEVOTHROID) 25 MCG tablet TAKE 1 TABLET(25 MCG) BY MOUTH DAILY BEFORE BREAKFAST 90 tablet 1  . losartan (COZAAR) 100 MG tablet TAKE 1 TABLET(100 MG) BY MOUTH DAILY 90 tablet 1  . metFORMIN (GLUCOPHAGE) 500 MG tablet Take 1/2 tablet by mouth daily with largest meal 45 tablet 0  . Multiple Vitamin (MULITIVITAMIN WITH MINERALS) TABS Take 1 tablet by mouth daily.    Glory Rosebush DELICA LANCETS 95K MISC TEST BLOOD SUGAR TWICE DAILY 100 each 2  . predniSONE (DELTASONE) 5 MG tablet Take 5 mg by mouth daily.    . simvastatin  (ZOCOR) 40 MG tablet TAKE 1 TABLET(40 MG) BY MOUTH EVERY EVENING 90 tablet 3  . vitamin B-12 (CYANOCOBALAMIN) 1000 MCG tablet Take 1,000 mcg by mouth daily.      . isosorbide mononitrate (IMDUR) 30 MG 24 hr tablet TAKE 1/2 TABLET BY MOUTH DAILY 15 tablet 5   No current facility-administered medications on file prior to visit.     Past Medical History:  Diagnosis Date  . Arthritis   . Cancer ([place])    skin on nose .  mylenoma  . Coronary artery disease    status post post LAD angioplasty in 1993  and subsequent coronary artery bypass grafting x1 with a LIMA to the LAD July of 1994. She had stenting of her RCA with a bare-metal stent November 2002.  . Diabetes mellitus    2  . Gout   . Heart murmur   . Hematoma    Right groin  . Hemorrhoids   . Hiatal hernia   . History of blood transfusion   . HOH (hard of hearing)   . Hyperlipidemia   . Hypertension   . Left knee DJD   . Lightheadedness    has seen Dr Gwenlyn Found and ENT.. cant find out why  . Osteoporosis   . PONV (postoperative nausea and vomiting)     Past Surgical History:  Procedure Laterality Date  . CORONARY ANGIOPLASTY  1993   LAD  . CORONARY ANGIOPLASTY WITH STENT PLACEMENT  03/09/01   RCA  . CORONARY ARTERY BYPASS GRAFT  1994  . JOINT REPLACEMENT Right 1998   hip  . NM MYOVIEW LTD  03/2012   Normal  . SKIN GRAFT     to nose skin cancer  . TOTAL HIP ARTHROPLASTY    . TOTAL KNEE ARTHROPLASTY Left 10/09/2013   Procedure: TOTAL KNEE ARTHROPLASTY;  Surgeon: Lorn Junes, MD;  Location: [facility];  Service: Orthopedics;  Laterality: Left;    Social History   Social History  . Marital status: Married    Spouse name: N/A  . Number of children: N/A  . Years of education: N/A   Social History Main Topics  . Smoking status: Never Smoker  . Smokeless tobacco: Never Used  . Alcohol use No  . Drug use: No  . Sexual activity: Not Currently   Other Topics Concern  . Not on file   Social History Narrative  . No  narrative on file    Family History  Problem Relation Age of Onset  . Heart disease Father   . Cancer Sister     breast  . Diabetes Sister   . Heart disease Brother   . Heart disease Brother   . Heart disease Brother   . Diabetes Sister     Review of Systems  Constitutional: Negative for appetite change and fever.  Respiratory: Negative for cough, shortness of breath and wheezing.  Cardiovascular: Positive for chest pain (occasional). Negative for palpitations and leg swelling.  Gastrointestinal: Negative for abdominal pain.  Neurological: Positive for dizziness (with head movements). Negative for headaches.       Objective:   Vitals:   12/23/15 0940  BP: (!) 162/90  Pulse: 76  Temp: 98 F (36.7 C)   Filed Weights   12/23/15 0940  Weight: 175 lb 4 oz (79.5 kg)   Body mass index is 27.7 kg/m.   Physical Exam    Constitutional: Appears well-developed and well-nourished. No distress.  HENT:  Head: Normocephalic and atraumatic.  Neck: Neck supple. No tracheal deviation present. No thyromegaly present.  Cardiovascular: Normal rate, regular rhythm and normal heart sounds.   No murmur heard. No carotid bruit  Pulmonary/Chest: Effort normal and breath sounds normal. No respiratory distress. No has no wheezes. No rales.  Musculoskeletal: No edema.  Lymphadenopathy: No cervical adenopathy.  Skin: Skin is warm and dry. Not diaphoretic.  Psychiatric: Normal mood and affect. Behavior is normal.     Assessment & Plan:    See Problem List for Assessment and Plan of chronic medical problems.

## 2015-12-23 NOTE — Assessment & Plan Note (Signed)
BP Readings from Last 3 Encounters:  12/23/15 (!) 162/90  07/12/15 (!) 146/62  06/15/15 136/80   Typically better controlled Monitor at home if possible No change in medications today cmp

## 2015-12-24 ENCOUNTER — Other Ambulatory Visit: Payer: Self-pay | Admitting: Internal Medicine

## 2016-01-10 ENCOUNTER — Ambulatory Visit (INDEPENDENT_AMBULATORY_CARE_PROVIDER_SITE_OTHER): Payer: Medicare Other | Admitting: Podiatry

## 2016-01-10 ENCOUNTER — Encounter: Payer: Self-pay | Admitting: Podiatry

## 2016-01-10 DIAGNOSIS — B351 Tinea unguium: Secondary | ICD-10-CM

## 2016-01-10 DIAGNOSIS — M79676 Pain in unspecified toe(s): Secondary | ICD-10-CM

## 2016-01-10 DIAGNOSIS — E1151 Type 2 diabetes mellitus with diabetic peripheral angiopathy without gangrene: Secondary | ICD-10-CM | POA: Diagnosis not present

## 2016-01-10 NOTE — Progress Notes (Signed)
Patient ID: SAIYURI AMARILLAS, female   DOB: 1932-06-25, 82 y.o.   MRN: [id number] Complaint:  Visit Type: Patient returns to my office for continued preventative foot care services. Complaint: Patient states" my nails have grown long and thick and become painful to walk and wear shoes" Patient has been diagnosed with DM with no foot complications. The patient presents for preventative foot care services. No changes to ROS  Podiatric Exam: Vascular: dorsalis pedis  are palpable bilateral  Posterior tibial pulses are absent B/L.Marland Kitchen Capillary return is immediate. Temperature gradient is WNL. Skin turgor WNL  Sensorium: Normal Semmes Weinstein monofilament test. Normal tactile sensation bilaterally. Nail Exam: Pt has thick disfigured discolored nails with subungual debris noted bilateral entire nail hallux through fifth toenails Ulcer Exam: There is no evidence of ulcer or pre-ulcerative changes or infection. Orthopedic Exam: Muscle tone and strength are WNL. No limitations in general ROM. No crepitus or effusions noted. Foot type and digits show no abnormalities. Bony prominences are unremarkable. Skin: No Porokeratosis. No infection or ulcers  Diagnosis:  Onychomycosis, , Pain in right toe, pain in left toes  Treatment & Plan Procedures and Treatment: Consent by patient was obtained for treatment procedures. The patient understood the discussion of treatment and procedures well. All questions were answered thoroughly reviewed. Debridement of mycotic and hypertrophic toenails, 1 through 5 bilateral and clearing of subungual debris. No ulceration, no infection noted.  Return Visit-Office Procedure: Patient instructed to return to the office for a follow up visit 3 months for continued evaluation and treatment.  Gardiner Barefoot DPM

## 2016-02-04 ENCOUNTER — Other Ambulatory Visit: Payer: Self-pay | Admitting: Internal Medicine

## 2016-02-21 ENCOUNTER — Other Ambulatory Visit: Payer: Self-pay | Admitting: Internal Medicine

## 2016-03-06 DIAGNOSIS — M150 Primary generalized (osteo)arthritis: Secondary | ICD-10-CM | POA: Diagnosis not present

## 2016-03-06 DIAGNOSIS — M1189 Other specified crystal arthropathies, multiple sites: Secondary | ICD-10-CM | POA: Diagnosis not present

## 2016-03-06 DIAGNOSIS — M810 Age-related osteoporosis without current pathological fracture: Secondary | ICD-10-CM | POA: Diagnosis not present

## 2016-03-13 ENCOUNTER — Other Ambulatory Visit: Payer: Self-pay | Admitting: Internal Medicine

## 2016-04-03 DIAGNOSIS — Z961 Presence of intraocular lens: Secondary | ICD-10-CM | POA: Diagnosis not present

## 2016-04-10 ENCOUNTER — Ambulatory Visit (INDEPENDENT_AMBULATORY_CARE_PROVIDER_SITE_OTHER): Payer: Medicare Other | Admitting: Podiatry

## 2016-04-10 ENCOUNTER — Encounter: Payer: Self-pay | Admitting: Podiatry

## 2016-04-10 DIAGNOSIS — M79676 Pain in unspecified toe(s): Secondary | ICD-10-CM

## 2016-04-10 DIAGNOSIS — E1151 Type 2 diabetes mellitus with diabetic peripheral angiopathy without gangrene: Secondary | ICD-10-CM | POA: Diagnosis not present

## 2016-04-10 DIAGNOSIS — B351 Tinea unguium: Secondary | ICD-10-CM | POA: Diagnosis not present

## 2016-04-10 NOTE — Progress Notes (Signed)
Patient ID: Sheila Morgan, female   DOB: Nov 03, 1932, 82 y.o.   MRN: [id number] Complaint:  Visit Type: Patient returns to my office for continued preventative foot care services. Complaint: Patient states" my nails have grown long and thick and become painful to walk and wear shoes" Patient has been diagnosed with DM with no foot complications. The patient presents for preventative foot care services. No changes to ROS  Podiatric Exam: Vascular: dorsalis pedis  are palpable bilateral  Posterior tibial pulses are absent B/L.Marland Kitchen Capillary return is immediate. Temperature gradient is WNL. Skin turgor WNL  Sensorium: Normal Semmes Weinstein monofilament test. Normal tactile sensation bilaterally. Nail Exam: Pt has thick disfigured discolored nails with subungual debris noted bilateral entire nail hallux through fifth toenails Ulcer Exam: There is no evidence of ulcer or pre-ulcerative changes or infection. Orthopedic Exam: Muscle tone and strength are WNL. No limitations in general ROM. No crepitus or effusions noted. Foot type and digits show no abnormalities. Bony prominences are unremarkable. Skin: No Porokeratosis. No infection or ulcers  Diagnosis:  Onychomycosis, , Pain in right toe, pain in left toes  Treatment & Plan Procedures and Treatment: Consent by patient was obtained for treatment procedures. The patient understood the discussion of treatment and procedures well. All questions were answered thoroughly reviewed. Debridement of mycotic and hypertrophic toenails, 1 through 5 bilateral and clearing of subungual debris. No ulceration, no infection noted.  Return Visit-Office Procedure: Patient instructed to return to the office for a follow up visit 3 months for continued evaluation and treatment.  Gardiner Barefoot DPM

## 2016-04-17 ENCOUNTER — Ambulatory Visit (INDEPENDENT_AMBULATORY_CARE_PROVIDER_SITE_OTHER): Payer: Medicare Other | Admitting: Cardiovascular Disease

## 2016-04-17 ENCOUNTER — Encounter: Payer: Self-pay | Admitting: Cardiovascular Disease

## 2016-04-17 VITALS — BP 130/60 | HR 85 | Ht 67.0 in | Wt 173.0 lb

## 2016-04-17 DIAGNOSIS — I2510 Atherosclerotic heart disease of native coronary artery without angina pectoris: Secondary | ICD-10-CM

## 2016-04-17 DIAGNOSIS — E785 Hyperlipidemia, unspecified: Secondary | ICD-10-CM

## 2016-04-17 DIAGNOSIS — I10 Essential (primary) hypertension: Secondary | ICD-10-CM

## 2016-04-17 DIAGNOSIS — Z951 Presence of aortocoronary bypass graft: Secondary | ICD-10-CM

## 2016-04-17 DIAGNOSIS — I2583 Coronary atherosclerosis due to lipid rich plaque: Secondary | ICD-10-CM | POA: Diagnosis not present

## 2016-04-17 NOTE — Progress Notes (Signed)
04/17/2016 Sheila Morgan   1932-09-07  [id number]  Primary Physician Binnie Rail, MD Primary Cardiologist: Lorretta Harp MD [name]  HPI:  The patient is a 82 year old mildly overweight married African American female mother of 2, grandmother to 4 grandchildren who I last saw 04/23/15.. I take care of her husband as well, who is now chronically ill. She has a history of CAD status post remote 1-vessel coronary artery bypass grafting with a LIMA to her LAD in 1994. I re-catheterized her in November of 2002 revealing a patent LIMA to the LAD with high-grade distal RCA disease which I stented using a 2.75 x 8 mm long BX Philosophy bare metal stent. Her other problems include hypertension, hyperlipidemia, non-insulin-requiring diabetes.  She had a Myoview stress test performed March 14, 2012 which was entirely normal. Since I saw her a year ago she's remained clinically stable and she continues to have atypical chest pain.    Current Outpatient Prescriptions  Medication Sig Dispense Refill  . alendronate (FOSAMAX) 70 MG tablet Take 70 mg by mouth once a week. Take with a full glass of water on an empty stomach.    Marland Kitchen amLODipine (NORVASC) 5 MG tablet Take 1 tablet (5 mg total) by mouth daily. 90 tablet 2  . aspirin 81 MG tablet Take 81 mg by mouth daily.    . Blood Glucose Monitoring Suppl (ONE TOUCH ULTRA MINI) W/DEVICE KIT Use to test blood sugar twice daily ICD 10 E11 21 1 each 0  . Calcium Carbonate-Vitamin D (CALCIUM 600-D PO) Take 1 tablet by mouth 2 (two) times daily.     . carvedilol (COREG) 25 MG tablet TAKE 1 TABLET(25 MG) BY MOUTH TWICE DAILY WITH A MEAL 60 tablet 2  . colchicine (COLCRYS) 0.6 MG tablet Take 0.6 mg by mouth daily.    . furosemide (LASIX) 20 MG tablet TAKE 1 TABLET(20 MG) BY MOUTH DAILY 90 tablet 0  . furosemide (LASIX) 20 MG tablet TAKE 1 TABLET(20 MG) BY MOUTH DAILY 90 tablet 0  . glucose blood (ONE TOUCH ULTRA TEST) test strip USE TO  TEST SUGARS TWICE DAILY E11.9 100 each 5  . isosorbide mononitrate (IMDUR) 30 MG 24 hr tablet Take 0.5 tablets (15 mg total) by mouth daily. 15 tablet 6  . levothyroxine (SYNTHROID, LEVOTHROID) 25 MCG tablet TAKE 1 TABLET(25 MCG) BY MOUTH DAILY BEFORE BREAKFAST 90 tablet 1  . levothyroxine (SYNTHROID, LEVOTHROID) 25 MCG tablet TAKE 1 TABLET(25 MCG) BY MOUTH DAILY BEFORE BREAKFAST 90 tablet 1  . losartan (COZAAR) 100 MG tablet Take 1 tablet (100 mg total) by mouth daily. 90 tablet 3  . metFORMIN (GLUCOPHAGE) 500 MG tablet Take 1/2 tablet by mouth daily with largest meal 45 tablet 0  . Multiple Vitamin (MULITIVITAMIN WITH MINERALS) TABS Take 1 tablet by mouth daily.    Glory Rosebush DELICA LANCETS 50I MISC TEST BLOOD SUGAR TWICE DAILY 100 each 2  . predniSONE (DELTASONE) 5 MG tablet Take 5 mg by mouth daily.    . simvastatin (ZOCOR) 40 MG tablet TAKE 1 TABLET(40 MG) BY MOUTH EVERY EVENING 90 tablet 3  . vitamin B-12 (CYANOCOBALAMIN) 1000 MCG tablet Take 1,000 mcg by mouth daily.       No current facility-administered medications for this visit.     No Known Allergies  Social History   Social History  . Marital status: Married    Spouse name: N/A  . Number of children: N/A  . Years  of education: N/A   Occupational History  . Not on file.   Social History Main Topics  . Smoking status: Never Smoker  . Smokeless tobacco: Never Used  . Alcohol use No  . Drug use: No  . Sexual activity: Not Currently   Other Topics Concern  . Not on file   Social History Narrative  . No narrative on file     Review of Systems: General: negative for chills, fever, night sweats or weight changes.  Cardiovascular: negative for chest pain, dyspnea on exertion, edema, orthopnea, palpitations, paroxysmal nocturnal dyspnea or shortness of breath Dermatological: negative for rash Respiratory: negative for cough or wheezing Urologic: negative for hematuria Abdominal: negative for nausea, vomiting,  diarrhea, bright red blood per rectum, melena, or hematemesis Neurologic: negative for visual changes, syncope, or dizziness All other systems reviewed and are otherwise negative except as noted above.    Blood pressure 130/60, pulse 85, height 5' 7"  (1.702 m), weight 173 lb (78.5 kg).  General appearance: alert and no distress Neck: no adenopathy, no carotid bruit, no JVD, supple, symmetrical, trachea midline and thyroid not enlarged, symmetric, no tenderness/mass/nodules Lungs: clear to auscultation bilaterally Heart: regular rate and rhythm, S1, S2 normal, no murmur, click, rub or gallop Extremities: extremities normal, atraumatic, no cyanosis or edema  EKG sinus rhythm 85 with nonspecific ST and T-wave changes. I personally reviewed this EKG.  ASSESSMENT AND PLAN:   Hyperlipidemia History of hyperlipidemia on statin therapy. We will recheck a lipid and liver profile  Essential hypertension History of hypertension blood pressure measured 130/60. She is on amlodipine, carvedilol and losartan. Continue current meds at current dosing  CORONARY ARTERY BYPASS GRAFT, HX OF History of CAD status post bypass graftingX 1 and 1994 with a LIMA to her LAD. I recatheterized her November 2002 revealing a patent LIMA with high-grade distal native RCA disease which I stented using a 2.75 mm x 8 mm long BX velocity bare metal stent. She has done well since. Her last Myoview performed 03/14/12 was entirely normal. She denies chest pain or shortness of breath.      Lorretta Harp MD FACP,FACC,FAHA, [facility] 04/17/2016 9:41 AM

## 2016-04-17 NOTE — Assessment & Plan Note (Signed)
History of hypertension blood pressure measured 130/60. She is on amlodipine, carvedilol and losartan. Continue current meds at current dosing

## 2016-04-17 NOTE — Patient Instructions (Signed)

## 2016-04-17 NOTE — Assessment & Plan Note (Signed)
History of CAD status post bypass graftingX 1 and 1994 with a LIMA to her LAD. I recatheterized her November 2002 revealing a patent LIMA with high-grade distal native RCA disease which I stented using a 2.75 mm x 8 mm long BX velocity bare metal stent. She has done well since. Her last Myoview performed 03/14/12 was entirely normal. She denies chest pain or shortness of breath.

## 2016-04-17 NOTE — Assessment & Plan Note (Signed)
History of hyperlipidemia on statin therapy. We will recheck a lipid and liver profile 

## 2016-04-20 DIAGNOSIS — E785 Hyperlipidemia, unspecified: Secondary | ICD-10-CM | POA: Diagnosis not present

## 2016-04-21 ENCOUNTER — Other Ambulatory Visit: Payer: Self-pay | Admitting: Cardiovascular Disease

## 2016-04-21 DIAGNOSIS — E785 Hyperlipidemia, unspecified: Secondary | ICD-10-CM

## 2016-04-21 LAB — HEPATIC FUNCTION PANEL
ALK PHOS: 79 U/L (ref 33–130)
ALT: 86 U/L — ABNORMAL HIGH (ref 6–29)
AST: 83 U/L — AB (ref 10–35)
Albumin: 3.6 g/dL (ref 3.6–5.1)
BILIRUBIN DIRECT: 0.3 mg/dL — AB (ref <=0.2)
BILIRUBIN INDIRECT: 0.5 mg/dL (ref 0.2–1.2)
BILIRUBIN TOTAL: 0.8 mg/dL (ref 0.2–1.2)
Total Protein: 6 g/dL — ABNORMAL LOW (ref 6.1–8.1)

## 2016-04-21 LAB — LIPID PANEL
CHOL/HDL RATIO: 2.3 ratio (ref <5.0)
CHOLESTEROL: 83 mg/dL (ref <200)
HDL: 36 mg/dL — ABNORMAL LOW (ref >50)
LDL Cholesterol: 21 mg/dL (ref <100)
Triglycerides: 130 mg/dL (ref <150)
VLDL: 26 mg/dL (ref <30)

## 2016-04-28 ENCOUNTER — Other Ambulatory Visit: Payer: Self-pay | Admitting: Cardiovascular Disease

## 2016-05-07 DIAGNOSIS — Z01419 Encounter for gynecological examination (general) (routine) without abnormal findings: Secondary | ICD-10-CM | POA: Diagnosis not present

## 2016-05-08 ENCOUNTER — Other Ambulatory Visit: Payer: Self-pay | Admitting: Internal Medicine

## 2016-05-23 ENCOUNTER — Other Ambulatory Visit: Payer: Self-pay | Admitting: Internal Medicine

## 2016-05-26 ENCOUNTER — Other Ambulatory Visit: Payer: Self-pay | Admitting: Internal Medicine

## 2016-06-04 DIAGNOSIS — Z79899 Other long term (current) drug therapy: Secondary | ICD-10-CM | POA: Diagnosis not present

## 2016-06-04 DIAGNOSIS — M1189 Other specified crystal arthropathies, multiple sites: Secondary | ICD-10-CM | POA: Diagnosis not present

## 2016-06-04 DIAGNOSIS — M150 Primary generalized (osteo)arthritis: Secondary | ICD-10-CM | POA: Diagnosis not present

## 2016-06-04 DIAGNOSIS — M810 Age-related osteoporosis without current pathological fracture: Secondary | ICD-10-CM | POA: Diagnosis not present

## 2016-06-07 NOTE — Patient Instructions (Addendum)

## 2016-06-07 NOTE — Progress Notes (Signed)
Subjective:    Patient ID: Sheila Morgan, female    DOB: 1932-09-16, 83 y.o.   MRN: [id number]  HPI The patient is here for follow up.  She is here with her daughter.  Hypertension: She is taking her medication daily. She is compliant with a low sodium diet.  She denies chest pain, palpitations, edema, shortness of breath.  She is not exercising regularly.     Diabetes: She is taking her medication daily as prescribed. She is not always compliant with a diabetic diet. She is not exercising regularly. She monitors her sugars and they have been running 117, today, 140.     Hypothyroidism:  She is taking her medication daily.  She denies any recent changes in energy or weight that are unexplained. She has chronic fatigue.   Hyperlipidemia: She is taking her medication daily. She is fairly compliant with a low fat/cholesterol diet. She is not exercising regularly. She denies myalgias.     Medications and allergies reviewed with patient and updated if appropriate.  Patient Active Problem List   Diagnosis Date Noted  . B12 deficiency 06/13/2015  . Hypothyroidism 06/13/2015  . DM (diabetes mellitus) type II controlled peripheral vascular disorder ([place]) 03/06/2014  . Loss of weight 02/12/2014  . Shingles 10/27/2013  . Gout 10/15/2013  . Arthritis   . Hiatal hernia   . Coronary artery disease   . Left knee DJD   . Benign paroxysmal positional vertigo 05/19/2013  . Weakness generalized 05/09/2013  . Pseudogout of left knee 03/29/2013  . Acute right lumbar radiculopathy 03/18/2013  . Shoulder impingement syndrome 01/25/2013  . Vertigo 10/17/2012  . Renal insufficiency, mild 06/20/2011  . Essential hypertension 12/31/2009  . Osteoporosis 12/31/2009  . SKIN CANCER, HX OF 12/31/2009  . Proteinuria 01/05/2008  . UNSPECIFIED ANEMIA 12/21/2007  . BLOOD IN STOOL, OCCULT 12/21/2007  . Hyperlipidemia 09/08/2006  . Polymyalgia rheumatica ([place]) 09/08/2006  . ALKALINE PHOSPHATASE,  ELEVATED 09/08/2006  . CORONARY ARTERY BYPASS GRAFT, HX OF 09/08/2006    Current Outpatient Prescriptions on File Prior to Visit  Medication Sig Dispense Refill  . alendronate (FOSAMAX) 70 MG tablet Take 70 mg by mouth once a week. Take with a full glass of water on an empty stomach.    Marland Kitchen amLODipine (NORVASC) 5 MG tablet TAKE 1 TABLET(5 MG) BY MOUTH DAILY 90 tablet 0  . aspirin 81 MG tablet Take 81 mg by mouth daily.    . Blood Glucose Monitoring Suppl (ONE TOUCH ULTRA MINI) W/DEVICE KIT Use to test blood sugar twice daily ICD 10 E11 21 1 each 0  . Calcium Carbonate-Vitamin D (CALCIUM 600-D PO) Take 1 tablet by mouth 2 (two) times daily.     . colchicine (COLCRYS) 0.6 MG tablet Take 0.6 mg by mouth daily.    . furosemide (LASIX) 20 MG tablet TAKE 1 TABLET(20 MG) BY MOUTH DAILY 90 tablet 0  . glucose blood (ONE TOUCH ULTRA TEST) test strip USE TO TEST SUGARS TWICE DAILY E11.9 100 each 5  . isosorbide mononitrate (IMDUR) 30 MG 24 hr tablet TAKE 1/2 TABLET BY MOUTH DAILY 15 tablet 11  . levothyroxine (SYNTHROID, LEVOTHROID) 25 MCG tablet TAKE 1 TABLET(25 MCG) BY MOUTH DAILY BEFORE BREAKFAST 90 tablet 1  . losartan (COZAAR) 100 MG tablet Take 1 tablet (100 mg total) by mouth daily. 90 tablet 3  . metFORMIN (GLUCOPHAGE) 500 MG tablet TAKE 1/2 TABLET BY MOUTH DAILY WITH LARGEST MEAL 45 tablet 1  . Multiple Vitamin (  MULITIVITAMIN WITH MINERALS) TABS Take 1 tablet by mouth daily.    Glory Rosebush DELICA LANCETS 91Q MISC TEST BLOOD SUGAR TWICE DAILY 100 each 2  . predniSONE (DELTASONE) 5 MG tablet Take 5 mg by mouth daily.    . vitamin B-12 (CYANOCOBALAMIN) 1000 MCG tablet Take 1,000 mcg by mouth daily.       No current facility-administered medications on file prior to visit.     Past Medical History:  Diagnosis Date  . Arthritis   . Cancer ([place])    skin on nose .  mylenoma  . Coronary artery disease    status post post LAD angioplasty in 1993  and subsequent coronary artery bypass grafting x1  with a LIMA to the LAD July of 1994. She had stenting of her RCA with a bare-metal stent November 2002.  . Diabetes mellitus    2  . Gout   . Heart murmur   . Hematoma    Right groin  . Hemorrhoids   . Hiatal hernia   . History of blood transfusion   . HOH (hard of hearing)   . Hyperlipidemia   . Hypertension   . Left knee DJD   . Lightheadedness    has seen Dr Gwenlyn Found and ENT.. cant find out why  . Osteoporosis   . PONV (postoperative nausea and vomiting)     Past Surgical History:  Procedure Laterality Date  . CORONARY ANGIOPLASTY  1993   LAD  . CORONARY ANGIOPLASTY WITH STENT PLACEMENT  03/09/01   RCA  . CORONARY ARTERY BYPASS GRAFT  1994  . JOINT REPLACEMENT Right 1998   hip  . NM MYOVIEW LTD  03/2012   Normal  . SKIN GRAFT     to nose skin cancer  . TOTAL HIP ARTHROPLASTY    . TOTAL KNEE ARTHROPLASTY Left 10/09/2013   Procedure: TOTAL KNEE ARTHROPLASTY;  Surgeon: Lorn Junes, MD;  Location: [facility];  Service: Orthopedics;  Laterality: Left;    Social History   Social History  . Marital status: Married    Spouse name: N/A  . Number of children: N/A  . Years of education: N/A   Social History Main Topics  . Smoking status: Never Smoker  . Smokeless tobacco: Never Used  . Alcohol use No  . Drug use: No  . Sexual activity: Not Currently   Other Topics Concern  . Not on file   Social History Narrative  . No narrative on file    Family History  Problem Relation Age of Onset  . Heart disease Father   . Cancer Sister     breast  . Diabetes Sister   . Heart disease Brother   . Heart disease Brother   . Heart disease Brother   . Diabetes Sister     Review of Systems  Constitutional: Positive for fatigue. Negative for chills and fever.  Respiratory: Negative for cough, shortness of breath and wheezing.   Cardiovascular: Negative for chest pain, palpitations and leg swelling.  Gastrointestinal: Negative for abdominal pain.  Endocrine: Positive for  cold intolerance.  Neurological: Positive for light-headedness and headaches (pressure on sides of head). Negative for dizziness.       Objective:   Vitals:   06/08/16 0849  BP: (!) 144/88  Pulse: 80  Resp: 16  Temp: 97.6 F (36.4 C)   Wt Readings from Last 3 Encounters:  06/08/16 166 lb (75.3 kg)  04/17/16 173 lb (78.5 kg)  12/23/15 175 lb 4  oz (79.5 kg)   Body mass index is 26 kg/m.   Physical Exam    Constitutional: Appears well-developed and well-nourished. No distress.  HENT:  Head: Normocephalic and atraumatic.  Neck: Neck supple. No tracheal deviation present. No thyromegaly present.  No cervical lymphadenopathy Cardiovascular: Normal rate, regular rhythm and normal heart sounds.   No murmur heard. No carotid bruit .  No edema Pulmonary/Chest: Effort normal and breath sounds normal. No respiratory distress. No has no wheezes. No rales.  Skin: Skin is warm and dry. Not diaphoretic.  Psychiatric: Normal mood and affect. Behavior is normal.      Assessment & Plan:    See Problem List for Assessment and Plan of chronic medical problems.

## 2016-06-08 ENCOUNTER — Ambulatory Visit (INDEPENDENT_AMBULATORY_CARE_PROVIDER_SITE_OTHER): Payer: Medicare Other | Admitting: Internal Medicine

## 2016-06-08 ENCOUNTER — Encounter: Payer: Self-pay | Admitting: Internal Medicine

## 2016-06-08 ENCOUNTER — Other Ambulatory Visit (INDEPENDENT_AMBULATORY_CARE_PROVIDER_SITE_OTHER): Payer: Medicare Other

## 2016-06-08 ENCOUNTER — Telehealth: Payer: Self-pay | Admitting: Internal Medicine

## 2016-06-08 VITALS — BP 144/88 | HR 80 | Temp 97.6°F | Resp 16 | Wt 166.0 lb

## 2016-06-08 DIAGNOSIS — I10 Essential (primary) hypertension: Secondary | ICD-10-CM | POA: Diagnosis not present

## 2016-06-08 DIAGNOSIS — E1151 Type 2 diabetes mellitus with diabetic peripheral angiopathy without gangrene: Secondary | ICD-10-CM | POA: Diagnosis not present

## 2016-06-08 DIAGNOSIS — E785 Hyperlipidemia, unspecified: Secondary | ICD-10-CM

## 2016-06-08 DIAGNOSIS — E038 Other specified hypothyroidism: Secondary | ICD-10-CM

## 2016-06-08 DIAGNOSIS — N289 Disorder of kidney and ureter, unspecified: Secondary | ICD-10-CM

## 2016-06-08 DIAGNOSIS — R634 Abnormal weight loss: Secondary | ICD-10-CM

## 2016-06-08 LAB — CBC WITH DIFFERENTIAL/PLATELET
BASOS ABS: 0.1 10*3/uL (ref 0.0–0.1)
Basophils Relative: 1 % (ref 0.0–3.0)
Eosinophils Absolute: 0.1 10*3/uL (ref 0.0–0.7)
Eosinophils Relative: 0.8 % (ref 0.0–5.0)
HCT: 42.3 % (ref 36.0–46.0)
HEMOGLOBIN: 14.2 g/dL (ref 12.0–15.0)
LYMPHS ABS: 1.3 10*3/uL (ref 0.7–4.0)
Lymphocytes Relative: 13.1 % (ref 12.0–46.0)
MCHC: 33.5 g/dL (ref 30.0–36.0)
MCV: 90.2 fl (ref 78.0–100.0)
MONOS PCT: 7 % (ref 3.0–12.0)
Monocytes Absolute: 0.7 10*3/uL (ref 0.1–1.0)
NEUTROS PCT: 78.1 % — AB (ref 43.0–77.0)
Neutro Abs: 7.8 10*3/uL — ABNORMAL HIGH (ref 1.4–7.7)
Platelets: 189 10*3/uL (ref 150.0–400.0)
RBC: 4.69 Mil/uL (ref 3.87–5.11)
RDW: 15.9 % — ABNORMAL HIGH (ref 11.5–15.5)
WBC: 10 10*3/uL (ref 4.0–10.5)

## 2016-06-08 LAB — LIPID PANEL
Cholesterol: 232 mg/dL — ABNORMAL HIGH (ref 0–200)
HDL: 70.8 mg/dL (ref >39.00)
LDL Cholesterol: 132 mg/dL — ABNORMAL HIGH (ref 0–99)
NONHDL: 161.32
Total CHOL/HDL Ratio: 3
Triglycerides: 149 mg/dL (ref 0.0–149.0)
VLDL: 29.8 mg/dL (ref 0.0–40.0)

## 2016-06-08 LAB — COMPREHENSIVE METABOLIC PANEL
ALBUMIN: 4.2 g/dL (ref 3.5–5.2)
ALK PHOS: 96 U/L (ref 39–117)
ALT: 26 U/L (ref 0–35)
AST: 20 U/L (ref 0–37)
BILIRUBIN TOTAL: 0.6 mg/dL (ref 0.2–1.2)
BUN: 12 mg/dL (ref 6–23)
CO2: 31 mEq/L (ref 19–32)
CREATININE: 1.14 mg/dL (ref 0.40–1.20)
Calcium: 10.8 mg/dL — ABNORMAL HIGH (ref 8.4–10.5)
Chloride: 104 mEq/L (ref 96–112)
GFR: 58.73 mL/min — ABNORMAL LOW (ref >60.00)
Glucose, Bld: 141 mg/dL — ABNORMAL HIGH (ref 70–99)
Potassium: 4.8 mEq/L (ref 3.5–5.1)
SODIUM: 146 meq/L — AB (ref 135–145)
TOTAL PROTEIN: 7.2 g/dL (ref 6.0–8.3)

## 2016-06-08 LAB — TSH: TSH: 2.68 u[IU]/mL (ref 0.35–4.50)

## 2016-06-08 LAB — HEMOGLOBIN A1C: HEMOGLOBIN A1C: 6.5 % (ref 4.6–6.5)

## 2016-06-08 MED ORDER — CARVEDILOL 25 MG PO TABS: ORAL_TABLET | ORAL | 3 refills | Status: DC

## 2016-06-08 NOTE — Progress Notes (Signed)
Pre visit review using our clinic review tool, if applicable. No additional management support is needed unless otherwise documented below in the visit note. 

## 2016-06-08 NOTE — Assessment & Plan Note (Signed)
CMP

## 2016-06-08 NOTE — Assessment & Plan Note (Signed)
Check tsh  Titrate med dose if needed  

## 2016-06-08 NOTE — Assessment & Plan Note (Signed)
BP Readings from Last 3 Encounters:  06/08/16 (!) 144/88  04/17/16 130/60  12/23/15 (!) 162/90   Blood pressure slightly elevated today, likely variable Stressed low-sodium diet Encouraged increased activity Continue current medications for now

## 2016-06-08 NOTE — Assessment & Plan Note (Signed)
On chronic prednisone for gout Sugars well controlled at home Check A1c No symptoms suggestive of hypoglycemia Continue metformin 500 mg daily

## 2016-06-08 NOTE — Telephone Encounter (Signed)
Please call her with her blood work results. Let her know I will forward her results to cardiology.   Blood counts are normal. Kidney function is slightly reduced, but stable. Liver tests are normal. Cholesterol is higher-cardiology will determine if she needs to go back on medication. Sugars are currently well controlled-A1c is 6.5%. Thyroid function is in normal range.

## 2016-06-08 NOTE — Assessment & Plan Note (Signed)
She has lost weight since she was here last-9 pounds. She denies any significant changes in her eating or activity. She is fairly sedentary Check lab work

## 2016-06-08 NOTE — Assessment & Plan Note (Signed)
Statin discontinued by cardiology Recheck lipid panel, CMP today since she is getting blood work done for me and his fasting-we'll forward to cardiology

## 2016-06-09 NOTE — Telephone Encounter (Signed)
Spoke with pt to inform.  

## 2016-06-15 ENCOUNTER — Other Ambulatory Visit: Payer: Self-pay | Admitting: Internal Medicine

## 2016-06-22 ENCOUNTER — Ambulatory Visit: Payer: Medicare Other

## 2016-06-23 ENCOUNTER — Ambulatory Visit (INDEPENDENT_AMBULATORY_CARE_PROVIDER_SITE_OTHER): Payer: Medicare Other | Admitting: Pharmacist Clinician (PhC)/ Clinical Pharmacy Specialist

## 2016-06-23 ENCOUNTER — Encounter: Payer: Self-pay | Admitting: Pharmacist Clinician (PhC)/ Clinical Pharmacy Specialist

## 2016-06-23 DIAGNOSIS — E785 Hyperlipidemia, unspecified: Secondary | ICD-10-CM | POA: Diagnosis not present

## 2016-06-23 NOTE — Patient Instructions (Signed)
Re-start your simvastatin 40 mg once daily in the evenings.  We will contact you in 2 months to repeat your labs.   Cholesterol Cholesterol is a fat. Your body needs a small amount of cholesterol. Cholesterol (plaque) may build up in your blood vessels (arteries). That makes you more likely to have a heart attack or stroke. You cannot feel your cholesterol level. Having a blood test is the only way to find out if your level is high. Keep your test results. Work with your doctor to keep your cholesterol at a good level. What do the results mean?  Total cholesterol is how much cholesterol is in your blood.  LDL is bad cholesterol. This is the type that can build up. Try to have low LDL.  HDL is good cholesterol. It cleans your blood vessels and carries LDL away. Try to have high HDL.  Triglycerides are fat that the body can store or burn for energy. What are good levels of cholesterol?  Total cholesterol below 200.  LDL below 100 is good for people who have health risks. LDL below 70 is good for people who have very high risks.  HDL above 40 is good. It is best to have HDL of 60 or higher.  Triglycerides below 150. How can I lower my cholesterol? Diet  Follow your diet program as told by your doctor.  Choose fish, white meat chicken, or [place] that is roasted or baked. Try not to eat red meat, fried foods, sausage, or lunch meats.  Eat lots of fresh fruits and vegetables.  Choose whole grains, beans, pasta, potatoes, and cereals.  Choose olive oil, corn oil, or canola oil. Only use small amounts.  Try not to eat butter, mayonnaise, shortening, or palm kernel oils.  Try not to eat foods with trans fats.  Choose low-fat or nonfat dairy foods.  Drink skim or nonfat milk.  Eat low-fat or nonfat yogurt and cheeses.  Try not to drink whole milk or cream.  Try not to eat ice cream, egg yolks, or full-fat cheeses.  Healthy desserts include angel food cake, ginger snaps,  animal crackers, hard candy, popsicles, and low-fat or nonfat frozen yogurt. Try not to eat pastries, cakes, pies, and cookies. Exercise  Follow your exercise program as told by your doctor.  Be more active. Try gardening, walking, and taking the stairs.  Ask your doctor about ways that you can be more active. Medicine  Take over-the-counter and prescription medicines only as told by your doctor. This information is not intended to replace advice given to you by your health care provider. Make sure you discuss any questions you have with your health care provider. Document Released: 07/17/2008 Document Revised: 11/20/2015 Document Reviewed: 10/31/2015 Elsevier Interactive Patient Education  2017 [employer].

## 2016-06-23 NOTE — Assessment & Plan Note (Signed)
Patient with history of ASCVD and previously low LDL at 21.  Two months after stopping the simvastatin 40 her LDL shot up to 132.  Will have her restart the simvastatin and repeat labs in 2 months.

## 2016-06-23 NOTE — Progress Notes (Signed)
06/23/2016 Sheila Morgan 02/16/1933 [id number]   HPI:  Sheila Morgan is a 83 y.o. female patient of Dr Gwenlyn Found, who presents today with her granddaughter for a lipid clinic evaluation.  Her cardiac history is significant for CAD with CABG x 1 in 1994 (LIMA to LAD), PCI with stent to RCA in 2002.  She also has hypertension, hyperlipidemia and NIDDM.  She has taken simvastatin 40 mg for many years without any side effects.  In December her LDL was 21, so Dr. Gwenlyn Found suggested she take a holiday from the medication.  Unfortunately after 2 months without the statin, her LDL shot up to 132.    Current Medications:  none  Cholesterol Goals:   LDL < 70   Intolerant/previously tried:  None  Family history:   Father and 2 brothers all died from heart attacks, father was 36, brothers in their 57's.  She also had one brother who died at the age of 1, she believes the family was told he had a heart attack.  Mother died from stroke in her 54's.    Diet:   Admits to [employer] breakfast biscuits at least twice weekly and [employer] for lunch dinner about the same.  Doesn't cook much at home, will eat occasional cereal or oatmeal for breakfast  Exercise:    none  Labs:  06/2016:  TC 232, TG 149, HDL 70.8, LDL 132 (no meds) 04/2016: TC 83, TG 130, HDL 36, LDL 21 (simvastatin 40) 04/2015: TC 152, TG 113, HDL 71, LDL 58 (simvastatin 40)   Current Outpatient Prescriptions  Medication Sig Dispense Refill  . alendronate (FOSAMAX) 70 MG tablet Take 70 mg by mouth once a week. Take with a full glass of water on an empty stomach.    Marland Kitchen amLODipine (NORVASC) 5 MG tablet TAKE 1 TABLET(5 MG) BY MOUTH DAILY 90 tablet 0  . aspirin 81 MG tablet Take 81 mg by mouth daily.    . Blood Glucose Monitoring Suppl (ONE TOUCH ULTRA MINI) W/DEVICE KIT Use to test blood sugar twice daily ICD 10 E11 21 1 each 0  . Calcium Carbonate-Vitamin D (CALCIUM 600-D PO) Take 1 tablet by mouth 2 (two) times daily.     . carvedilol (COREG) 25  MG tablet TAKE 1 TABLET(25 MG) BY MOUTH TWICE DAILY WITH A MEAL 180 tablet 3  . colchicine (COLCRYS) 0.6 MG tablet Take 0.6 mg by mouth daily.    . furosemide (LASIX) 20 MG tablet TAKE 1 TABLET(20 MG) BY MOUTH DAILY 90 tablet 0  . isosorbide mononitrate (IMDUR) 30 MG 24 hr tablet TAKE 1/2 TABLET BY MOUTH DAILY 15 tablet 11  . levothyroxine (SYNTHROID, LEVOTHROID) 25 MCG tablet TAKE 1 TABLET(25 MCG) BY MOUTH DAILY BEFORE BREAKFAST 90 tablet 1  . losartan (COZAAR) 100 MG tablet Take 1 tablet (100 mg total) by mouth daily. 90 tablet 3  . metFORMIN (GLUCOPHAGE) 500 MG tablet TAKE 1/2 TABLET BY MOUTH DAILY WITH LARGEST MEAL 45 tablet 1  . Multiple Vitamin (MULITIVITAMIN WITH MINERALS) TABS Take 1 tablet by mouth daily.    . ONE TOUCH ULTRA TEST test strip USE TO TEST BLOOD SUGAR TWICE DAILY 300 each 1  . ONETOUCH DELICA LANCETS 40J MISC TEST BLOOD SUGAR TWICE DAILY 100 each 2  . predniSONE (DELTASONE) 5 MG tablet Take 5 mg by mouth daily.    . vitamin B-12 (CYANOCOBALAMIN) 1000 MCG tablet Take 1,000 mcg by mouth daily.       No current facility-administered medications for  this visit.     No Known Allergies  Past Medical History:  Diagnosis Date  . Arthritis   . Cancer ([place])    skin on nose .  mylenoma  . Coronary artery disease    status post post LAD angioplasty in 1993  and subsequent coronary artery bypass grafting x1 with a LIMA to the LAD July of 1994. She had stenting of her RCA with a bare-metal stent November 2002.  . Diabetes mellitus    2  . Gout   . Heart murmur   . Hematoma    Right groin  . Hemorrhoids   . Hiatal hernia   . History of blood transfusion   . HOH (hard of hearing)   . Hyperlipidemia   . Hypertension   . Left knee DJD   . Lightheadedness    has seen Dr Gwenlyn Found and ENT.. cant find out why  . Osteoporosis   . PONV (postoperative nausea and vomiting)     Blood pressure 140/72.   Hyperlipidemia Patient with history of ASCVD and previously low LDL at 21.   Two months after stopping the simvastatin 40 her LDL shot up to 132.  Will have her restart the simvastatin and repeat labs in 2 months.     Tommy Medal PharmD CPP [facility]

## 2016-07-03 ENCOUNTER — Ambulatory Visit (INDEPENDENT_AMBULATORY_CARE_PROVIDER_SITE_OTHER): Payer: Medicare Other | Admitting: Podiatry

## 2016-07-03 ENCOUNTER — Encounter: Payer: Self-pay | Admitting: Podiatry

## 2016-07-03 VITALS — Ht 67.0 in | Wt 166.0 lb

## 2016-07-03 DIAGNOSIS — E1151 Type 2 diabetes mellitus with diabetic peripheral angiopathy without gangrene: Secondary | ICD-10-CM

## 2016-07-03 DIAGNOSIS — M79676 Pain in unspecified toe(s): Secondary | ICD-10-CM | POA: Diagnosis not present

## 2016-07-03 DIAGNOSIS — B351 Tinea unguium: Secondary | ICD-10-CM

## 2016-07-03 NOTE — Progress Notes (Signed)
Patient ID: Sheila Morgan, female   DOB: 1932-06-13, 83 y.o.   MRN: [id number] Complaint:  Visit Type: Patient returns to my office for continued preventative foot care services. Complaint: Patient states" my nails have grown long and thick and become painful to walk and wear shoes" Patient has been diagnosed with DM with no foot complications. The patient presents for preventative foot care services. No changes to ROS  Podiatric Exam: Vascular: dorsalis pedis  are palpable bilateral  Posterior tibial pulses are absent B/L.Marland Kitchen Capillary return is immediate. Temperature gradient is WNL. Skin turgor WNL  Sensorium: Normal Semmes Weinstein monofilament test. Normal tactile sensation bilaterally. Nail Exam: Pt has thick disfigured discolored nails with subungual debris noted bilateral entire nail hallux through fifth toenails Ulcer Exam: There is no evidence of ulcer or pre-ulcerative changes or infection. Orthopedic Exam: Muscle tone and strength are WNL. No limitations in general ROM. No crepitus or effusions noted. Foot type and digits show no abnormalities. Bony prominences are unremarkable. Skin:  Porokeratosis sub 5th metatarsal  B/L. No infection or ulcers  Diagnosis:  Onychomycosis, , Pain in right toe, pain in left toes Porokeratosis  Treatment & Plan Procedures and Treatment: Consent by patient was obtained for treatment procedures. The patient understood the discussion of treatment and procedures well. All questions were answered thoroughly reviewed. Debridement of mycotic and hypertrophic toenails, 1 through 5 bilateral and clearing of subungual debris. No ulceration, no infection noted. Debride porokeratosis Return Visit-Office Procedure: Patient instructed to return to the office for a follow up visit 3 months for continued evaluation and treatment.  Gardiner Barefoot DPM

## 2016-07-10 ENCOUNTER — Ambulatory Visit: Payer: Medicare Other | Admitting: Podiatry

## 2016-07-15 DIAGNOSIS — Z1231 Encounter for screening mammogram for malignant neoplasm of breast: Secondary | ICD-10-CM | POA: Diagnosis not present

## 2016-07-15 DIAGNOSIS — Z803 Family history of malignant neoplasm of breast: Secondary | ICD-10-CM | POA: Diagnosis not present

## 2016-07-15 LAB — HM MAMMOGRAPHY

## 2016-07-22 ENCOUNTER — Encounter: Payer: Self-pay | Admitting: Internal Medicine

## 2016-08-27 ENCOUNTER — Other Ambulatory Visit: Payer: Self-pay | Admitting: Internal Medicine

## 2016-08-28 ENCOUNTER — Other Ambulatory Visit: Payer: Self-pay | Admitting: Internal Medicine

## 2016-10-02 ENCOUNTER — Ambulatory Visit: Payer: Medicare Other | Admitting: Podiatry

## 2016-10-02 DIAGNOSIS — M810 Age-related osteoporosis without current pathological fracture: Secondary | ICD-10-CM | POA: Diagnosis not present

## 2016-10-02 DIAGNOSIS — M25561 Pain in right knee: Secondary | ICD-10-CM | POA: Diagnosis not present

## 2016-10-02 DIAGNOSIS — M150 Primary generalized (osteo)arthritis: Secondary | ICD-10-CM | POA: Diagnosis not present

## 2016-10-02 DIAGNOSIS — Z79899 Other long term (current) drug therapy: Secondary | ICD-10-CM | POA: Diagnosis not present

## 2016-10-02 DIAGNOSIS — M1189 Other specified crystal arthropathies, multiple sites: Secondary | ICD-10-CM | POA: Diagnosis not present

## 2016-10-03 ENCOUNTER — Other Ambulatory Visit: Payer: Self-pay | Admitting: Internal Medicine

## 2016-10-21 ENCOUNTER — Encounter: Payer: Self-pay | Admitting: Podiatry

## 2016-10-21 ENCOUNTER — Ambulatory Visit (INDEPENDENT_AMBULATORY_CARE_PROVIDER_SITE_OTHER): Payer: Medicare Other | Admitting: Podiatry

## 2016-10-21 DIAGNOSIS — Q828 Other specified congenital malformations of skin: Secondary | ICD-10-CM

## 2016-10-21 DIAGNOSIS — M79676 Pain in unspecified toe(s): Secondary | ICD-10-CM

## 2016-10-21 DIAGNOSIS — B351 Tinea unguium: Secondary | ICD-10-CM

## 2016-10-21 NOTE — Progress Notes (Signed)
Patient ID: DIOSELINA BRUMBAUGH, female   DOB: [birth year], 83 y.o.   MRN: [id number] Complaint:  Visit Type: Patient returns to my office for continued preventative foot care services. Complaint: Patient states" my nails have grown long and thick and become painful to walk and wear shoes" Patient has been diagnosed with DM with no foot complications. The patient presents for preventative foot care services. No changes to ROS  Podiatric Exam: Vascular: dorsalis pedis  are palpable bilateral  Posterior tibial pulses are absent B/L.Marland Kitchen Capillary return is immediate. Temperature gradient is WNL. Skin turgor WNL  Sensorium: Normal Semmes Weinstein monofilament test. Normal tactile sensation bilaterally. Nail Exam: Pt has thick disfigured discolored nails with subungual debris noted bilateral entire nail hallux through fifth toenails Ulcer Exam: There is no evidence of ulcer or pre-ulcerative changes or infection. Orthopedic Exam: Muscle tone and strength are WNL. No limitations in general ROM. No crepitus or effusions noted. Foot type and digits show no abnormalities. Bony prominences are unremarkable. Skin:  Porokeratosis sub 5th metatarsal  B/L. No infection or ulcers  Diagnosis:  Onychomycosis, , Pain in right toe, pain in left toes Porokeratosis  Treatment & Plan Procedures and Treatment: Consent by patient was obtained for treatment procedures. The patient understood the discussion of treatment and procedures well. All questions were answered thoroughly reviewed. Debridement of mycotic and hypertrophic toenails, 1 through 5 bilateral and clearing of subungual debris. No ulceration, no infection noted. Debride porokeratosis Return Visit-Office Procedure: Patient instructed to return to the office for a follow up visit 3 months for continued evaluation and treatment.  Gardiner Barefoot DPM

## 2016-11-03 NOTE — Telephone Encounter (Signed)
error 

## 2016-11-11 ENCOUNTER — Other Ambulatory Visit: Payer: Self-pay | Admitting: Internal Medicine

## 2016-12-06 NOTE — Progress Notes (Signed)
Subjective:    Patient ID: Sheila Morgan, female    DOB: May 16, 1932, 83 y.o.   MRN: [id number]  HPI     Medications and allergies reviewed with patient and updated if appropriate.  Patient Active Problem List   Diagnosis Date Noted  . B12 deficiency 06/13/2015  . Hypothyroidism 06/13/2015  . DM (diabetes mellitus) type II controlled peripheral vascular disorder 03/06/2014  . Loss of weight 02/12/2014  . Shingles 10/27/2013  . Gout 10/15/2013  . Arthritis   . Hiatal hernia   . Coronary artery disease   . Left knee DJD   . Benign paroxysmal positional vertigo 05/19/2013  . Weakness generalized 05/09/2013  . Pseudogout of left knee 03/29/2013  . Acute right lumbar radiculopathy 03/18/2013  . Shoulder impingement syndrome 01/25/2013  . Vertigo 10/17/2012  . Renal insufficiency, mild 06/20/2011  . Essential hypertension 12/31/2009  . Osteoporosis 12/31/2009  . SKIN CANCER, HX OF 12/31/2009  . Proteinuria 01/05/2008  . UNSPECIFIED ANEMIA 12/21/2007  . BLOOD IN STOOL, OCCULT 12/21/2007  . Hyperlipidemia 09/08/2006  . Polymyalgia rheumatica ([place]) 09/08/2006  . ALKALINE PHOSPHATASE, ELEVATED 09/08/2006  . CORONARY ARTERY BYPASS GRAFT, HX OF 09/08/2006    Current Outpatient Prescriptions on File Prior to Visit  Medication Sig Dispense Refill  . alendronate (FOSAMAX) 70 MG tablet Take 70 mg by mouth once a week. Take with a full glass of water on an empty stomach.    Marland Kitchen amLODipine (NORVASC) 5 MG tablet TAKE 1 TABLET(5 MG) BY MOUTH DAILY 90 tablet 2  . aspirin 81 MG tablet Take 81 mg by mouth daily.    . Blood Glucose Monitoring Suppl (ONE TOUCH ULTRA MINI) W/DEVICE KIT Use to test blood sugar twice daily ICD 10 E11 21 1 each 0  . Calcium Carbonate-Vitamin D (CALCIUM 600-D PO) Take 1 tablet by mouth 2 (two) times daily.     . carvedilol (COREG) 25 MG tablet TAKE 1 TABLET(25 MG) BY MOUTH TWICE DAILY WITH A MEAL 180 tablet 3  . colchicine (COLCRYS) 0.6 MG tablet Take 0.6 mg  by mouth daily.    . furosemide (LASIX) 20 MG tablet TAKE 1 TABLET(20 MG) BY MOUTH DAILY 90 tablet 1  . isosorbide mononitrate (IMDUR) 30 MG 24 hr tablet TAKE 1/2 TABLET BY MOUTH DAILY 15 tablet 11  . levothyroxine (SYNTHROID, LEVOTHROID) 25 MCG tablet TAKE 1 TABLET(25 MCG) BY MOUTH DAILY BEFORE BREAKFAST 90 tablet 1  . losartan (COZAAR) 100 MG tablet Take 1 tablet (100 mg total) by mouth daily. 90 tablet 3  . metFORMIN (GLUCOPHAGE) 500 MG tablet TAKE 1/2 TABLET BY MOUTH DAILY WITH LARGEST MEAL 45 tablet 1  . Multiple Vitamin (MULITIVITAMIN WITH MINERALS) TABS Take 1 tablet by mouth daily.    . ONE TOUCH ULTRA TEST test strip USE TO TEST BLOOD SUGAR TWICE DAILY 300 each 1  . ONETOUCH DELICA LANCETS 64W MISC TEST BLOOD SUGAR TWICE DAILY 100 each 2  . predniSONE (DELTASONE) 5 MG tablet Take 5 mg by mouth daily.    . vitamin B-12 (CYANOCOBALAMIN) 1000 MCG tablet Take 1,000 mcg by mouth daily.       No current facility-administered medications on file prior to visit.     Past Medical History:  Diagnosis Date  . Arthritis   . Cancer ([place])    skin on nose .  mylenoma  . Coronary artery disease    status post post LAD angioplasty in 1993  and subsequent coronary artery bypass grafting x1 with  a LIMA to the LAD July of 1994. She had stenting of her RCA with a bare-metal stent November 2002.  . Diabetes mellitus    2  . Gout   . Heart murmur   . Hematoma    Right groin  . Hemorrhoids   . Hiatal hernia   . History of blood transfusion   . HOH (hard of hearing)   . Hyperlipidemia   . Hypertension   . Left knee DJD   . Lightheadedness    has seen Dr Gwenlyn Found and ENT.. cant find out why  . Osteoporosis   . PONV (postoperative nausea and vomiting)     Past Surgical History:  Procedure Laterality Date  . CORONARY ANGIOPLASTY  1993   LAD  . CORONARY ANGIOPLASTY WITH STENT PLACEMENT  03/09/01   RCA  . CORONARY ARTERY BYPASS GRAFT  1994  . JOINT REPLACEMENT Right 1998   hip  . NM MYOVIEW  LTD  03/2012   Normal  . SKIN GRAFT     to nose skin cancer  . TOTAL HIP ARTHROPLASTY    . TOTAL KNEE ARTHROPLASTY Left 10/09/2013   Procedure: TOTAL KNEE ARTHROPLASTY;  Surgeon: Lorn Junes, MD;  Location: [facility];  Service: Orthopedics;  Laterality: Left;    Social History   Social History  . Marital status: Married    Spouse name: N/A  . Number of children: N/A  . Years of education: N/A   Social History Main Topics  . Smoking status: Never Smoker  . Smokeless tobacco: Never Used  . Alcohol use No  . Drug use: No  . Sexual activity: Not Currently   Other Topics Concern  . Not on file   Social History Narrative  . No narrative on file    Family History  Problem Relation Age of Onset  . Heart disease Father   . Cancer Sister        breast  . Diabetes Sister   . Heart disease Brother   . Heart disease Brother   . Heart disease Brother   . Diabetes Sister     Review of Systems     Objective:  There were no vitals filed for this visit. Wt Readings from Last 3 Encounters:  07/03/16 166 lb (75.3 kg)  06/08/16 166 lb (75.3 kg)  04/17/16 173 lb (78.5 kg)   There is no height or weight on file to calculate BMI.   Physical Exam         Assessment & Plan:    See Problem List for Assessment and Plan of chronic medical problems.   This encounter was created in error - please disregard.

## 2016-12-07 ENCOUNTER — Encounter: Payer: Medicare Other | Admitting: Internal Medicine

## 2016-12-22 ENCOUNTER — Encounter: Payer: Self-pay | Admitting: Internal Medicine

## 2016-12-22 ENCOUNTER — Ambulatory Visit (INDEPENDENT_AMBULATORY_CARE_PROVIDER_SITE_OTHER): Payer: Medicare Other | Admitting: Internal Medicine

## 2016-12-22 ENCOUNTER — Other Ambulatory Visit (INDEPENDENT_AMBULATORY_CARE_PROVIDER_SITE_OTHER): Payer: Medicare Other

## 2016-12-22 VITALS — BP 164/86 | HR 72 | Temp 97.8°F | Resp 16 | Wt 167.0 lb

## 2016-12-22 DIAGNOSIS — E038 Other specified hypothyroidism: Secondary | ICD-10-CM | POA: Diagnosis not present

## 2016-12-22 DIAGNOSIS — E7800 Pure hypercholesterolemia, unspecified: Secondary | ICD-10-CM | POA: Diagnosis not present

## 2016-12-22 DIAGNOSIS — I10 Essential (primary) hypertension: Secondary | ICD-10-CM

## 2016-12-22 DIAGNOSIS — E1151 Type 2 diabetes mellitus with diabetic peripheral angiopathy without gangrene: Secondary | ICD-10-CM | POA: Diagnosis not present

## 2016-12-22 LAB — CBC WITH DIFFERENTIAL/PLATELET
BASOS PCT: 0.7 % (ref 0.0–3.0)
Basophils Absolute: 0.1 10*3/uL (ref 0.0–0.1)
EOS ABS: 0.1 10*3/uL (ref 0.0–0.7)
Eosinophils Relative: 1.4 % (ref 0.0–5.0)
HEMATOCRIT: 41.2 % (ref 36.0–46.0)
HEMOGLOBIN: 13.9 g/dL (ref 12.0–15.0)
LYMPHS PCT: 13.5 % (ref 12.0–46.0)
Lymphs Abs: 1.3 10*3/uL (ref 0.7–4.0)
MCHC: 33.8 g/dL (ref 30.0–36.0)
MCV: 93.5 fl (ref 78.0–100.0)
Monocytes Absolute: 0.9 10*3/uL (ref 0.1–1.0)
Monocytes Relative: 8.7 % (ref 3.0–12.0)
Neutro Abs: 7.4 10*3/uL (ref 1.4–7.7)
Neutrophils Relative %: 75.7 % (ref 43.0–77.0)
Platelets: 186 10*3/uL (ref 150.0–400.0)
RBC: 4.4 Mil/uL (ref 3.87–5.11)
RDW: 14.9 % (ref 11.5–15.5)
WBC: 9.8 10*3/uL (ref 4.0–10.5)

## 2016-12-22 LAB — COMPREHENSIVE METABOLIC PANEL
ALK PHOS: 79 U/L (ref 39–117)
ALT: 25 U/L (ref 0–35)
AST: 22 U/L (ref 0–37)
Albumin: 3.7 g/dL (ref 3.5–5.2)
BILIRUBIN TOTAL: 0.6 mg/dL (ref 0.2–1.2)
BUN: 14 mg/dL (ref 6–23)
CALCIUM: 10.9 mg/dL — AB (ref 8.4–10.5)
CO2: 38 mEq/L — ABNORMAL HIGH (ref 19–32)
CREATININE: 1.16 mg/dL (ref 0.40–1.20)
Chloride: 99 mEq/L (ref 96–112)
GFR: 57.48 mL/min — AB (ref >60.00)
GLUCOSE: 148 mg/dL — AB (ref 70–99)
Potassium: 3.3 mEq/L — ABNORMAL LOW (ref 3.5–5.1)
Sodium: 143 mEq/L (ref 135–145)
TOTAL PROTEIN: 7.1 g/dL (ref 6.0–8.3)

## 2016-12-22 LAB — HEMOGLOBIN A1C: Hgb A1c MFr Bld: 6.8 % — ABNORMAL HIGH (ref 4.6–6.5)

## 2016-12-22 LAB — TSH: TSH: 4.96 u[IU]/mL — AB (ref 0.35–4.50)

## 2016-12-22 MED ORDER — LOSARTAN POTASSIUM 100 MG PO TABS: 100.0000 mg | ORAL_TABLET | Freq: Every day | ORAL | 3 refills | Status: DC

## 2016-12-22 NOTE — Patient Instructions (Addendum)

## 2016-12-22 NOTE — Assessment & Plan Note (Signed)
Check tsh  Titrate med dose if needed  

## 2016-12-22 NOTE — Assessment & Plan Note (Signed)
Sugars well controlled at home encouraged increased activity Continue metformin Check a1c

## 2016-12-22 NOTE — Progress Notes (Signed)
Subjective:    Patient ID: Sheila Morgan, female    DOB: [birth year], 83 y.o.   MRN: [id number]  HPI The patient is here for follow up.     Hypertension: She is taking her medication daily. She is compliant with a low sodium diet.  She has very transient chest pain on occasion.  It does not occur daily and is not related to activity.  She will discuss with cardiology.  She denies palpitations, edema, shortness of breath and regular headaches. She is not exercising regularly.  She does not monitor her blood pressure at home.    Diabetes: She is taking her medication daily as prescribed. She is compliant with a diabetic diet, except for sweet tea. She is not exercising regularly. She monitors her sugars and they have been running 126, 127.  She is up-to-date with an ophthalmology examination.   Hypothyroidism:  She is taking her medication daily.  She denies any recent changes in energy or weight that are unexplained.    Medications and allergies reviewed with patient and updated if appropriate.  Patient Active Problem List   Diagnosis Date Noted  . B12 deficiency 06/13/2015  . Hypothyroidism 06/13/2015  . DM (diabetes mellitus) type II, controlled, with peripheral vascular disorder ([place]) 03/06/2014  . Shingles 10/27/2013  . Gout 10/15/2013  . Arthritis   . Hiatal hernia   . Coronary artery disease   . Left knee DJD   . Benign paroxysmal positional vertigo 05/19/2013  . Weakness generalized 05/09/2013  . Pseudogout of left knee 03/29/2013  . Acute right lumbar radiculopathy 03/18/2013  . Shoulder impingement syndrome 01/25/2013  . Vertigo 10/17/2012  . Renal insufficiency, mild 06/20/2011  . Essential hypertension 12/31/2009  . Osteoporosis 12/31/2009  . SKIN CANCER, HX OF 12/31/2009  . Proteinuria 01/05/2008  . UNSPECIFIED ANEMIA 12/21/2007  . Hyperlipidemia 09/08/2006  . Polymyalgia rheumatica ([place]) 09/08/2006  . CORONARY ARTERY BYPASS GRAFT, HX OF 09/08/2006     Current Outpatient Prescriptions on File Prior to Visit  Medication Sig Dispense Refill  . alendronate (FOSAMAX) 70 MG tablet Take 70 mg by mouth once a week. Take with a full glass of water on an empty stomach.    Marland Kitchen amLODipine (NORVASC) 5 MG tablet TAKE 1 TABLET(5 MG) BY MOUTH DAILY 90 tablet 2  . aspirin 81 MG tablet Take 81 mg by mouth daily.    . Blood Glucose Monitoring Suppl (ONE TOUCH ULTRA MINI) W/DEVICE KIT Use to test blood sugar twice daily ICD 10 E11 21 1 each 0  . Calcium Carbonate-Vitamin D (CALCIUM 600-D PO) Take 1 tablet by mouth 2 (two) times daily.     . carvedilol (COREG) 25 MG tablet TAKE 1 TABLET(25 MG) BY MOUTH TWICE DAILY WITH A MEAL 180 tablet 3  . colchicine (COLCRYS) 0.6 MG tablet Take 0.6 mg by mouth every other day.     . furosemide (LASIX) 20 MG tablet TAKE 1 TABLET(20 MG) BY MOUTH DAILY 90 tablet 1  . isosorbide mononitrate (IMDUR) 30 MG 24 hr tablet TAKE 1/2 TABLET BY MOUTH DAILY 15 tablet 11  . levothyroxine (SYNTHROID, LEVOTHROID) 25 MCG tablet TAKE 1 TABLET(25 MCG) BY MOUTH DAILY BEFORE BREAKFAST 90 tablet 1  . metFORMIN (GLUCOPHAGE) 500 MG tablet TAKE 1/2 TABLET BY MOUTH DAILY WITH LARGEST MEAL 45 tablet 1  . Multiple Vitamin (MULITIVITAMIN WITH MINERALS) TABS Take 1 tablet by mouth daily.    . ONE TOUCH ULTRA TEST test strip USE TO TEST BLOOD SUGAR  TWICE DAILY 300 each 1  . ONETOUCH DELICA LANCETS 35H MISC TEST BLOOD SUGAR TWICE DAILY 100 each 2  . predniSONE (DELTASONE) 5 MG tablet Take 5 mg by mouth daily.    . vitamin B-12 (CYANOCOBALAMIN) 1000 MCG tablet Take 1,000 mcg by mouth daily.       No current facility-administered medications on file prior to visit.     Past Medical History:  Diagnosis Date  . Arthritis   . Cancer ([place])    skin on nose .  mylenoma  . Coronary artery disease    status post post LAD angioplasty in 1993  and subsequent coronary artery bypass grafting x1 with a LIMA to the LAD July of 1994. She had stenting of her RCA  with a bare-metal stent November 2002.  . Diabetes mellitus    2  . Gout   . Heart murmur   . Hematoma    Right groin  . Hemorrhoids   . Hiatal hernia   . History of blood transfusion   . HOH (hard of hearing)   . Hyperlipidemia   . Hypertension   . Left knee DJD   . Lightheadedness    has seen Dr Gwenlyn Found and ENT.. cant find out why  . Osteoporosis   . PONV (postoperative nausea and vomiting)     Past Surgical History:  Procedure Laterality Date  . CORONARY ANGIOPLASTY  1993   LAD  . CORONARY ANGIOPLASTY WITH STENT PLACEMENT  03/09/01   RCA  . CORONARY ARTERY BYPASS GRAFT  1994  . JOINT REPLACEMENT Right 1998   hip  . NM MYOVIEW LTD  03/2012   Normal  . SKIN GRAFT     to nose skin cancer  . TOTAL HIP ARTHROPLASTY    . TOTAL KNEE ARTHROPLASTY Left 10/09/2013   Procedure: TOTAL KNEE ARTHROPLASTY;  Surgeon: Lorn Junes, MD;  Location: [facility];  Service: Orthopedics;  Laterality: Left;    Social History   Social History  . Marital status: Married    Spouse name: N/A  . Number of children: N/A  . Years of education: N/A   Social History Main Topics  . Smoking status: Never Smoker  . Smokeless tobacco: Never Used  . Alcohol use No  . Drug use: No  . Sexual activity: Not Currently   Other Topics Concern  . Not on file   Social History Narrative  . No narrative on file    Family History  Problem Relation Age of Onset  . Heart disease Father   . Cancer Sister        breast  . Diabetes Sister   . Heart disease Brother   . Heart disease Brother   . Heart disease Brother   . Diabetes Sister     Review of Systems  Constitutional: Negative for appetite change, chills and fever.  Respiratory: Negative for cough, shortness of breath and wheezing.   Cardiovascular: Positive for chest pain (sometimes pressure, transient). Negative for palpitations and leg swelling.  Neurological: Positive for dizziness and light-headedness. Negative for headaches.        Objective:   Vitals:   12/22/16 0943  BP: (!) 164/86  Pulse: 72  Resp: 16  Temp: 97.8 F (36.6 C)  SpO2: 96%   Wt Readings from Last 3 Encounters:  12/22/16 167 lb (75.8 kg)  07/03/16 166 lb (75.3 kg)  06/08/16 166 lb (75.3 kg)   Body mass index is 26.16 kg/m.   Physical Exam  Constitutional: Appears well-developed and well-nourished. No distress.  HENT:  Head: Normocephalic and atraumatic.  Neck: Neck supple. No tracheal deviation present. No thyromegaly present.  No cervical lymphadenopathy Cardiovascular: Normal rate, regular rhythm and normal heart sounds.   No murmur heard. No carotid bruit .  No edema Pulmonary/Chest: Effort normal and breath sounds normal. No respiratory distress. No has no wheezes. No rales.  Skin: Skin is warm and dry. Not diaphoretic.  Psychiatric: Normal mood and affect. Behavior is normal.      Assessment & Plan:    See Problem List for Assessment and Plan of chronic medical problems.

## 2016-12-22 NOTE — Assessment & Plan Note (Signed)
BP Readings from Last 3 Encounters:  12/22/16 (!) 164/86  06/23/16 140/72  06/08/16 (!) 144/88   ? Controlled She will start monitoring her BP at home Low sodium diet Increase exercise

## 2017-01-05 ENCOUNTER — Ambulatory Visit (INDEPENDENT_AMBULATORY_CARE_PROVIDER_SITE_OTHER): Payer: Medicare Other | Admitting: Family Medicine

## 2017-01-05 ENCOUNTER — Ambulatory Visit (INDEPENDENT_AMBULATORY_CARE_PROVIDER_SITE_OTHER)
Admission: RE | Admit: 2017-01-05 | Discharge: 2017-01-05 | Disposition: A | Discharge status: Home / Self Care | Payer: Medicare Other | Source: Ambulatory Visit | Attending: Family Medicine | Admitting: Family Medicine

## 2017-01-05 ENCOUNTER — Encounter: Payer: Self-pay | Admitting: Family Medicine

## 2017-01-05 ENCOUNTER — Other Ambulatory Visit: Payer: Self-pay | Admitting: Internal Medicine

## 2017-01-05 ENCOUNTER — Telehealth: Payer: Self-pay | Admitting: Family Medicine

## 2017-01-05 VITALS — BP 140/82 | HR 68 | Temp 97.9°F | Ht 67.0 in | Wt 164.0 lb

## 2017-01-05 DIAGNOSIS — M25511 Pain in right shoulder: Secondary | ICD-10-CM

## 2017-01-05 DIAGNOSIS — M25561 Pain in right knee: Secondary | ICD-10-CM | POA: Insufficient documentation

## 2017-01-05 DIAGNOSIS — S4991XA Unspecified injury of right shoulder and upper arm, initial encounter: Secondary | ICD-10-CM | POA: Diagnosis not present

## 2017-01-05 DIAGNOSIS — R0781 Pleurodynia: Secondary | ICD-10-CM

## 2017-01-05 DIAGNOSIS — W19XXXA Unspecified fall, initial encounter: Secondary | ICD-10-CM

## 2017-01-05 DIAGNOSIS — S8991XA Unspecified injury of right lower leg, initial encounter: Secondary | ICD-10-CM | POA: Diagnosis not present

## 2017-01-05 DIAGNOSIS — S299XXA Unspecified injury of thorax, initial encounter: Secondary | ICD-10-CM | POA: Diagnosis not present

## 2017-01-05 MED ORDER — TRAMADOL HCL 50 MG PO TABS: 50.0000 mg | ORAL_TABLET | Freq: Three times a day (TID) | ORAL | 0 refills | Status: DC | PRN

## 2017-01-05 NOTE — Patient Instructions (Addendum)
Thank you for coming in,   I will call you with the results from today. Please follow up if your symptoms do not improve. Please seek immediate care if you have shortness of breath.   Please take tylenol on a regular basis.    Please feel free to call with any questions or concerns at any time, at [phone]. --Dr. Raeford Razor

## 2017-01-05 NOTE — Assessment & Plan Note (Signed)
Significant ecchymosis on the anterior aspect of her knee. She has normal range of motion. Could possibly have atraumatic worse afterwards.  - Knee x-rays - Tramadol for pain - Advised to take Tylenol regularly as well - Advised to follow-up in a week. - Could consider physical therapy as she seemed to have some weakness getting up from a seated position - Advised to use a rolling walker for stability

## 2017-01-05 NOTE — Assessment & Plan Note (Signed)
Appears to be a contusion after fall. Does not appear to have any fracture. We'll also check for pneumothorax but does not appear to have one on exam. - Rib series - If concern for pneumothorax would order a CT chest

## 2017-01-05 NOTE — Assessment & Plan Note (Signed)
Does not appear to have a fracture on exam as she is able to have full range of motion. She does have significant pain on lateral aspect of her shoulder. We'll evaluate for humeral fracture - Tramadol for pain - Right shoulder x-rays

## 2017-01-05 NOTE — Telephone Encounter (Signed)
Spoke with patient's daughter's husband. Informed him of the imaging results.   Rosemarie Ax, MD [facility] 01/05/2017, 1:02 PM

## 2017-01-05 NOTE — Progress Notes (Signed)
Sheila Morgan - 83 y.o. female MRN [id number]  Date of birth: [birth year]  SUBJECTIVE:  Including CC & ROS.  Chief Complaint  Patient presents with  . Fall    patient missed the last step on the stairs and fell to the ground. fell on right side. patient has bruised right knee and states it hurts to breath. patient has a hard tim walking a uses a cane to help    Sheila Morgan is an 83 year old female that is presenting with right shoulder pain, right rib pain, and right knee pain after a fall this morning. She missed the last step and landed on her right side. Since that time she has significant pain in the right shoulder on the lateral aspect. She also has pain in the right mid axillary region. She also has significant pain in the anterior aspect of her right knee. She has pain with getting up from a seated position. She has pain with raising her arm over her head. She is not taking any medications since this time. She is not taking any anticoagulation. She is walking with a single-point cane. She has a history of a fall and a fracture of her hand.   Review of her DEXA scan from 05/03/14 shows low bone mass.  Review of Systems  Respiratory: Negative for shortness of breath.   Cardiovascular: Positive for chest pain.  Musculoskeletal: Positive for arthralgias, gait problem and myalgias. Negative for back pain and joint swelling.  Skin: Positive for color change.  Neurological: Negative for light-headedness.  Hematological: Negative for adenopathy. Bruises/bleeds easily.  otherwise negative   HISTORY: Past Medical, Surgical, Social, and Family History Reviewed & Updated per EMR.   Pertinent Historical Findings include:  Past Medical History:  Diagnosis Date  . Arthritis   . Cancer ([place])    skin on nose .  mylenoma  . Coronary artery disease    status post post LAD angioplasty in 1993  and subsequent coronary artery bypass grafting x1 with a LIMA to the LAD July of 1994. She had stenting of  her RCA with a bare-metal stent November 2002.  . Diabetes mellitus    2  . Gout   . Heart murmur   . Hematoma    Right groin  . Hemorrhoids   . Hiatal hernia   . History of blood transfusion   . HOH (hard of hearing)   . Hyperlipidemia   . Hypertension   . Left knee DJD   . Lightheadedness    has seen Dr Gwenlyn Found and ENT.. cant find out why  . Osteoporosis   . PONV (postoperative nausea and vomiting)     Past Surgical History:  Procedure Laterality Date  . CORONARY ANGIOPLASTY  1993   LAD  . CORONARY ANGIOPLASTY WITH STENT PLACEMENT  03/09/01   RCA  . CORONARY ARTERY BYPASS GRAFT  1994  . JOINT REPLACEMENT Right 1998   hip  . NM MYOVIEW LTD  03/2012   Normal  . SKIN GRAFT     to nose skin cancer  . TOTAL HIP ARTHROPLASTY    . TOTAL KNEE ARTHROPLASTY Left 10/09/2013   Procedure: TOTAL KNEE ARTHROPLASTY;  Surgeon: Lorn Junes, MD;  Location: [facility];  Service: Orthopedics;  Laterality: Left;    No Known Allergies  Family History  Problem Relation Age of Onset  . Heart disease Father   . Cancer Sister        breast  . Diabetes Sister   .  Heart disease Brother   . Heart disease Brother   . Heart disease Brother   . Diabetes Sister      Social History   Social History  . Marital status: Married    Spouse name: N/A  . Number of children: N/A  . Years of education: N/A   Occupational History  . Not on file.   Social History Main Topics  . Smoking status: Never Smoker  . Smokeless tobacco: Never Used  . Alcohol use No  . Drug use: No  . Sexual activity: Not Currently   Other Topics Concern  . Not on file   Social History Narrative  . No narrative on file     PHYSICAL EXAM:  VS: BP 140/82 (BP Location: Left Arm, Patient Position: Sitting, Cuff Size: Normal)   Pulse 68   Temp 97.9 F (36.6 C) (Oral)   Ht 5\' 7"  (1.702 m)   Wt 164 lb (74.4 kg)   SpO2 99%   BMI 25.69 kg/m  Physical Exam Gen: NAD, alert, cooperative with exam,  ENT: normal  lips, normal nasal mucosa,  Eye: normal EOM, normal conjunctiva and lids CV:  no edema, +2 pedal pulses, S1-S2, regular rate and rhythm   Resp: no accessory muscle use, non-labored, clear to auscultation bilaterally, no areas of dullness GI: no masses or tenderness, no hernia  Skin: no rashes, no areas of induration  Neuro: normal tone, normal sensation to touch Psych:  normal insight, alert and oriented MSK:  Right shoulder: Tenderness to palpation on the lateral aspect of the shoulder. No swelling or ecchymosis. Acromioclavicular joint does not appear to be damaged. Normal elbow range of motion. Able to raise her arm and flexion and abduction to about 110. Right ribs: Tenderness to palpation in the mid axillary line. No ecchymosis  No step-off palpated. Right knee: Ecchymosis and swelling on the anterior aspect. Normal range of motion. Normal strength. Pain and limp present with ambulation. Neurovascularly intact     ASSESSMENT & PLAN:   Rib pain on right side Appears to be a contusion after fall. Does not appear to have any fracture. We'll also check for pneumothorax but does not appear to have one on exam. - Rib series - If concern for pneumothorax would order a CT chest  Acute pain of right shoulder Does not appear to have a fracture on exam as she is able to have full range of motion. She does have significant pain on lateral aspect of her shoulder. We'll evaluate for humeral fracture - Tramadol for pain - Right shoulder x-rays   Acute pain of right knee Significant ecchymosis on the anterior aspect of her knee. She has normal range of motion. Could possibly have atraumatic worse afterwards.  - Knee x-rays - Tramadol for pain - Advised to take Tylenol regularly as well - Advised to follow-up in a week. - Could consider physical therapy as she seemed to have some weakness getting up from a seated position - Advised to use a rolling walker for stability

## 2017-01-15 ENCOUNTER — Ambulatory Visit: Payer: Medicare Other | Admitting: Internal Medicine

## 2017-01-19 ENCOUNTER — Ambulatory Visit (INDEPENDENT_AMBULATORY_CARE_PROVIDER_SITE_OTHER): Payer: Medicare Other | Admitting: Internal Medicine

## 2017-01-19 ENCOUNTER — Encounter: Payer: Self-pay | Admitting: Internal Medicine

## 2017-01-19 VITALS — BP 146/80 | HR 82 | Temp 98.0°F | Resp 16 | Ht 67.0 in | Wt 163.0 lb

## 2017-01-19 DIAGNOSIS — S2241XA Multiple fractures of ribs, right side, initial encounter for closed fracture: Secondary | ICD-10-CM | POA: Insufficient documentation

## 2017-01-19 DIAGNOSIS — M25561 Pain in right knee: Secondary | ICD-10-CM

## 2017-01-19 DIAGNOSIS — M25511 Pain in right shoulder: Secondary | ICD-10-CM | POA: Diagnosis not present

## 2017-01-19 DIAGNOSIS — S2241XD Multiple fractures of ribs, right side, subsequent encounter for fracture with routine healing: Secondary | ICD-10-CM | POA: Diagnosis not present

## 2017-01-19 NOTE — Progress Notes (Signed)
Subjective:    Patient ID: Sheila Morgan, female    DOB: December 18, 1932, 83 y.o.   MRN: [id number]  HPI The patient is here for follow up.  Rib fractures, right 6-7th ribs: she fell at home after missing a step.  Her 6-7th ribs On the right were fracture. She states the pain has improved since last week. She still has pain in her hurts to move or take a deep breaths. She is taking Tylenol for the pain and feels the pain is controlled. She does have a cough, but it is chronic and she denies any recent changes. She denies any fevers, chills, shortness of breath or wheezing.  She also had right knee pain and right shoulder pain.  Her right knee and right shoulder are just sore.  She did have significant bruising in the right knee and that has improved. There is still some residual bruising and swelling of the knee.   She was prescribed tramadol but never took it.  She is taking tylenol.  She feels the pain in the knee and shoulder is controlled..     Medications and allergies reviewed with patient and updated if appropriate.  Patient Active Problem List   Diagnosis Date Noted  . Acute pain of right shoulder 01/05/2017  . Rib pain on right side 01/05/2017  . Acute pain of right knee 01/05/2017  . B12 deficiency 06/13/2015  . Hypothyroidism 06/13/2015  . DM (diabetes mellitus) type II, controlled, with peripheral vascular disorder ([place]) 03/06/2014  . Shingles 10/27/2013  . Gout 10/15/2013  . Arthritis   . Hiatal hernia   . Coronary artery disease   . Left knee DJD   . Benign paroxysmal positional vertigo 05/19/2013  . Weakness generalized 05/09/2013  . Pseudogout of left knee 03/29/2013  . Acute right lumbar radiculopathy 03/18/2013  . Shoulder impingement syndrome 01/25/2013  . Vertigo 10/17/2012  . Renal insufficiency, mild 06/20/2011  . Essential hypertension 12/31/2009  . Osteoporosis 12/31/2009  . SKIN CANCER, HX OF 12/31/2009  . Proteinuria 01/05/2008  . UNSPECIFIED ANEMIA  12/21/2007  . Hyperlipidemia 09/08/2006  . Polymyalgia rheumatica ([place]) 09/08/2006  . CORONARY ARTERY BYPASS GRAFT, HX OF 09/08/2006    Current Outpatient Prescriptions on File Prior to Visit  Medication Sig Dispense Refill  . alendronate (FOSAMAX) 70 MG tablet Take 70 mg by mouth once a week. Take with a full glass of water on an empty stomach.    Marland Kitchen amLODipine (NORVASC) 5 MG tablet TAKE 1 TABLET(5 MG) BY MOUTH DAILY 90 tablet 2  . aspirin 81 MG tablet Take 81 mg by mouth daily.    . Blood Glucose Monitoring Suppl (ONE TOUCH ULTRA MINI) W/DEVICE KIT Use to test blood sugar twice daily ICD 10 E11 21 1 each 0  . Calcium Carbonate-Vitamin D (CALCIUM 600-D PO) Take 1 tablet by mouth 2 (two) times daily.     . carvedilol (COREG) 25 MG tablet TAKE 1 TABLET(25 MG) BY MOUTH TWICE DAILY WITH A MEAL 180 tablet 3  . colchicine (COLCRYS) 0.6 MG tablet Take 0.6 mg by mouth every other day.     . furosemide (LASIX) 20 MG tablet TAKE 1 TABLET(20 MG) BY MOUTH DAILY 90 tablet 1  . isosorbide mononitrate (IMDUR) 30 MG 24 hr tablet TAKE 1/2 TABLET BY MOUTH DAILY 15 tablet 11  . levothyroxine (SYNTHROID, LEVOTHROID) 25 MCG tablet TAKE 1 TABLET(25 MCG) BY MOUTH DAILY BEFORE BREAKFAST 90 tablet 1  . losartan (COZAAR) 100 MG tablet Take  1 tablet (100 mg total) by mouth daily. 90 tablet 3  . metFORMIN (GLUCOPHAGE) 500 MG tablet TAKE 1/2 TABLET BY MOUTH DAILY WITH LARGEST MEAL 45 tablet 1  . Multiple Vitamin (MULITIVITAMIN WITH MINERALS) TABS Take 1 tablet by mouth daily.    . ONE TOUCH ULTRA TEST test strip USE TO TEST BLOOD SUGAR TWICE DAILY 300 each 1  . ONETOUCH DELICA LANCETS 22Q MISC TEST BLOOD SUGAR TWICE DAILY. 100 each 0  . predniSONE (DELTASONE) 5 MG tablet Take 5 mg by mouth daily.    . vitamin B-12 (CYANOCOBALAMIN) 1000 MCG tablet Take 1,000 mcg by mouth daily.      . traMADol (ULTRAM) 50 MG tablet Take 1 tablet (50 mg total) by mouth every 8 (eight) hours as needed. (Patient not taking: Reported on  01/19/2017) 30 tablet 0   No current facility-administered medications on file prior to visit.     Past Medical History:  Diagnosis Date  . Arthritis   . Cancer ([place])    skin on nose .  mylenoma  . Coronary artery disease    status post post LAD angioplasty in 1993  and subsequent coronary artery bypass grafting x1 with a LIMA to the LAD July of 1994. She had stenting of her RCA with a bare-metal stent November 2002.  . Diabetes mellitus    2  . Gout   . Heart murmur   . Hematoma    Right groin  . Hemorrhoids   . Hiatal hernia   . History of blood transfusion   . HOH (hard of hearing)   . Hyperlipidemia   . Hypertension   . Left knee DJD   . Lightheadedness    has seen Dr Gwenlyn Found and ENT.. cant find out why  . Osteoporosis   . PONV (postoperative nausea and vomiting)     Past Surgical History:  Procedure Laterality Date  . CORONARY ANGIOPLASTY  1993   LAD  . CORONARY ANGIOPLASTY WITH STENT PLACEMENT  03/09/01   RCA  . CORONARY ARTERY BYPASS GRAFT  1994  . JOINT REPLACEMENT Right 1998   hip  . NM MYOVIEW LTD  03/2012   Normal  . SKIN GRAFT     to nose skin cancer  . TOTAL HIP ARTHROPLASTY    . TOTAL KNEE ARTHROPLASTY Left 10/09/2013   Procedure: TOTAL KNEE ARTHROPLASTY;  Surgeon: Lorn Junes, MD;  Location: [facility];  Service: Orthopedics;  Laterality: Left;    Social History   Social History  . Marital status: Married    Spouse name: N/A  . Number of children: N/A  . Years of education: N/A   Social History Main Topics  . Smoking status: Never Smoker  . Smokeless tobacco: Never Used  . Alcohol use No  . Drug use: No  . Sexual activity: Not Currently   Other Topics Concern  . Not on file   Social History Narrative  . No narrative on file    Family History  Problem Relation Age of Onset  . Heart disease Father   . Cancer Sister        breast  . Diabetes Sister   . Heart disease Brother   . Heart disease Brother   . Heart disease Brother   .  Diabetes Sister     Review of Systems  Constitutional: Negative for chills and fever.  HENT: Positive for congestion and ear pain (left ear). Negative for sinus pain and sore throat.   Respiratory: Positive  for cough (chronic, no change, dry). Negative for shortness of breath and wheezing.   Cardiovascular: Negative for chest pain.  Neurological: Positive for dizziness and headaches (not new, not worse since fall).       Objective:   Vitals:   01/19/17 1115  BP: (!) 146/80  Pulse: 82  Resp: 16  Temp: 98 F (36.7 C)  SpO2: 96%   Wt Readings from Last 3 Encounters:  01/19/17 163 lb (73.9 kg)  01/05/17 164 lb (74.4 kg)  12/22/16 167 lb (75.8 kg)   Body mass index is 25.53 kg/m.   Physical Exam    Constitutional: Appears well-developed and well-nourished. No distress.  HENT:  Head: Normocephalic and atraumatic.  Cardiovascular: Normal rate, regular rhythm and normal heart sounds.   No murmur heard.   No edema Pulmonary/Chest: Right lateral ribs mildly tender with light palpation, Effort normal and breath sounds normal. No respiratory distress. No has no wheezes. No rales.  Msk:  Right shoulder slightly tender to palpation. No visual bruising of shoulder. No deformity noted. Right knee with moderate swelling and resolving bruising that extends to mid anterior lower leg  Skin: Skin is warm and dry. Not diaphoretic.  Psychiatric: Normal mood and affect. Behavior is normal.   DG Ribs Unilateral W/Chest Right CLINICAL DATA:  Status post fall this morning. Generalized pain since then.  EXAM: RIGHT RIBS AND CHEST - 3+ VIEW  COMPARISON:  Chest x-ray of May 09, 2013  FINDINGS: The lungs are mildly hyper inflated. There is no focal infiltrate. There is no significant pleural effusion and no pneumothorax. The heart is mildly enlarged. The pulmonary vascularity is normal. The patient has undergone previous median sternotomy and internal mammary artery dissection. There is  calcification in the wall of the thoracic aorta.  Right rib detail images reveals subtle contour deformities of the lateral aspects of the sixth and seventh ribs.  IMPRESSION: COPD.  No pneumothorax nor other acute cardiopulmonary abnormality.  Subtle cortical deformities of the lateral aspects of the right sixth and seventh ribs are consistent with acute fractures.  Electronically Signed   By: David  Martinique M.D.   On: 01/05/2017 12:20 DG Knee Complete 4 Views Right CLINICAL DATA:  Status post fall this morning with persistent generalized pain since then.  EXAM: RIGHT KNEE - COMPLETE 4+ VIEW  COMPARISON:  Report of right knee series of February 09, 2012  FINDINGS: The bones are subjectively adequately mineralized. There is mild narrowing of the medial and lateral joint compartment spaces. There is beaking of the tibial spines. Spurs arise from the articular margins of the lateral femoral condyle and lateral tibial plateau. There are large osteophytes arising from the articular margins of the patella with narrowing of the patellofemoral joint space. There is no joint effusion or acute fracture. The proximal fibula is unremarkable. There is calcification in the wall of the popliteal artery and distal superficial femoral artery.  IMPRESSION: Moderate osteoarthritic change involving all 3 joint compartments. No acute fracture or dislocation is observed.  Electronically Signed   By: David  Martinique M.D.   On: 01/05/2017 12:17 DG Shoulder Right CLINICAL DATA:  Status post fall with generalized pain since  EXAM: RIGHT SHOULDER - 2+ VIEW  COMPARISON:  Chest x-ray of May 09, 2013 which included portions of the right shoulder.  FINDINGS: The bones are subjectively osteopenic. No acute fracture or dislocation is observed. There is moderate narrowing of the glenohumeral joint space, subacromial subdeltoid space, and the AC joint space. The  soft tissues are  unremarkable.  IMPRESSION: There is moderate degenerative change of the right shoulder. There is no acute bony abnormality.  Electronically Signed   By: David  Martinique M.D.   On: 01/05/2017 12:16     Assessment & Plan:    See Problem List for Assessment and Plan of chronic medical problems.

## 2017-01-19 NOTE — Assessment & Plan Note (Signed)
Uncomplicated rib fractures Pain control with Tylenol No concerning symptoms of pneumonia-did discuss with her daughter of signs of pneumonia to monitor for Continue Tylenol

## 2017-01-19 NOTE — Assessment & Plan Note (Signed)
Related to recent fall at home X-ray shows no acute injury/fracture Taking Tylenol and her pain is controlled Discussed. Options

## 2017-01-19 NOTE — Assessment & Plan Note (Signed)
Acute pain related to a fall at home No bruising or deformity X-ray showed no fracture or dislocation Pain is improving Continue Tylenol

## 2017-01-19 NOTE — Patient Instructions (Addendum)
Continue taking the tylenol for your pain.  Monitor for lung infections / pneumonia.     Call if your pain does not continue to improve or if you have any questions.      Rib Fracture A rib fracture is a break or crack in one of the bones of the ribs. The ribs are a group of long, curved bones that wrap around your chest and attach to your spine. They protect your lungs and other organs in the chest cavity. A broken or cracked rib is often painful, but most do not cause other problems. Most rib fractures heal on their own over time. However, rib fractures can be more serious if multiple ribs are broken or if broken ribs move out of place and push against other structures. What are the causes?  A direct blow to the chest. For example, this could happen during contact sports, a car accident, or a fall against a hard object.  Repetitive movements with high force, such as pitching a baseball or having severe coughing spells. What are the signs or symptoms?  Pain when you breathe in or cough.  Pain when someone presses on the injured area. How is this diagnosed? Your caregiver will perform a physical exam. Various imaging tests may be ordered to confirm the diagnosis and to look for related injuries. These tests may include a chest X-ray, computed tomography (CT), magnetic resonance imaging (MRI), or a bone scan. How is this treated? Rib fractures usually heal on their own in 1-3 months. The longer healing period is often associated with a continued cough or other aggravating activities. During the healing period, pain control is very important. Medication is usually given to control pain. Hospitalization or surgery may be needed for more severe injuries, such as those in which multiple ribs are broken or the ribs have moved out of place. Follow these instructions at home:  Avoid strenuous activity and any activities or movements that cause pain. Be careful during activities and avoid bumping  the injured rib.  Gradually increase activity as directed by your caregiver.  Only take over-the-counter or prescription medications as directed by your caregiver. Do not take other medications without asking your caregiver first.  Apply ice to the injured area for the first 1-2 days after you have been treated or as directed by your caregiver. Applying ice helps to reduce inflammation and pain. ? Put ice in a plastic bag. ? Place a towel between your skin and the bag. ? Leave the ice on for 15-20 minutes at a time, every 2 hours while you are awake.  Perform deep breathing as directed by your caregiver. This will help prevent pneumonia, which is a common complication of a broken rib. Your caregiver may instruct you to: ? Take deep breaths several times a day. ? Try to cough several times a day, holding a pillow against the injured area. ? Use a device called an incentive spirometer to practice deep breathing several times a day.  Drink enough fluids to keep your urine clear or pale yellow. This will help you avoid constipation.  Do not wear a rib belt or binder. These restrict breathing, which can lead to pneumonia. Get help right away if:  You have a fever.  You have difficulty breathing or shortness of breath.  You develop a continual cough, or you cough up thick or bloody sputum.  You feel sick to your stomach (nausea), throw up (vomit), or have abdominal pain.  You have worsening  pain not controlled with medications. This information is not intended to replace advice given to you by your health care provider. Make sure you discuss any questions you have with your health care provider. Document Released: 04/20/2005 Document Revised: 09/26/2015 Document Reviewed: 06/22/2012 Elsevier Interactive Patient Education  [employer].

## 2017-01-22 ENCOUNTER — Ambulatory Visit (INDEPENDENT_AMBULATORY_CARE_PROVIDER_SITE_OTHER): Payer: Medicare Other | Admitting: Podiatry

## 2017-01-22 ENCOUNTER — Encounter: Payer: Self-pay | Admitting: Podiatry

## 2017-01-22 DIAGNOSIS — E1159 Type 2 diabetes mellitus with other circulatory complications: Secondary | ICD-10-CM | POA: Diagnosis not present

## 2017-01-22 DIAGNOSIS — M79675 Pain in left toe(s): Secondary | ICD-10-CM | POA: Diagnosis not present

## 2017-01-22 DIAGNOSIS — B351 Tinea unguium: Secondary | ICD-10-CM

## 2017-01-22 DIAGNOSIS — M79674 Pain in right toe(s): Secondary | ICD-10-CM

## 2017-01-22 NOTE — Progress Notes (Signed)
Patient ID: Sheila Morgan, female   DOB: 1933-01-08, 83 y.o.   MRN: [id number] Complaint:  Visit Type: Patient returns to my office for continued preventative foot care services. Complaint: Patient states" my nails have grown long and thick and become painful to walk and wear shoes" Patient has been diagnosed with DM with no foot complications. The patient presents for preventative foot care services. No changes to ROS  Podiatric Exam: Vascular: dorsalis pedis  are palpable bilateral  Posterior tibial pulses are absent B/L.Marland Kitchen Capillary return is immediate. Temperature gradient is WNL. Skin turgor WNL  Absence of hair. Sensorium: Normal Semmes Weinstein monofilament test. Normal tactile sensation bilaterally. Nail Exam: Pt has thick disfigured discolored nails with subungual debris noted bilateral entire nail hallux through fifth toenails Ulcer Exam: There is no evidence of ulcer or pre-ulcerative changes or infection. Orthopedic Exam: Muscle tone and strength are WNL. No limitations in general ROM. No crepitus or effusions noted. Foot type and digits show no abnormalities. Bony prominences are unremarkable. Skin:  Porokeratosis sub 5th metatarsal  B/L. No infection or ulcers  Diagnosis:  Onychomycosis, , Pain in right toe, pain in left toes Porokeratosis  Treatment & Plan Procedures and Treatment: Consent by patient was obtained for treatment procedures. The patient understood the discussion of treatment and procedures well. All questions were answered thoroughly reviewed. Debridement of mycotic and hypertrophic toenails, 1 through 5 bilateral and clearing of subungual debris. No ulceration, no infection noted. Debride porokeratosis Return Visit-Office Procedure: Patient instructed to return to the office for a follow up visit 3 months for continued evaluation and treatment.  Gardiner Barefoot DPM

## 2017-02-01 DIAGNOSIS — M810 Age-related osteoporosis without current pathological fracture: Secondary | ICD-10-CM | POA: Diagnosis not present

## 2017-02-01 DIAGNOSIS — M170 Bilateral primary osteoarthritis of knee: Secondary | ICD-10-CM | POA: Diagnosis not present

## 2017-02-01 DIAGNOSIS — M1189 Other specified crystal arthropathies, multiple sites: Secondary | ICD-10-CM | POA: Diagnosis not present

## 2017-02-01 DIAGNOSIS — M25561 Pain in right knee: Secondary | ICD-10-CM | POA: Diagnosis not present

## 2017-02-01 DIAGNOSIS — Z79899 Other long term (current) drug therapy: Secondary | ICD-10-CM | POA: Diagnosis not present

## 2017-02-01 DIAGNOSIS — M150 Primary generalized (osteo)arthritis: Secondary | ICD-10-CM | POA: Diagnosis not present

## 2017-02-17 DIAGNOSIS — Z23 Encounter for immunization: Secondary | ICD-10-CM | POA: Diagnosis not present

## 2017-03-22 ENCOUNTER — Other Ambulatory Visit: Payer: Self-pay | Admitting: Internal Medicine

## 2017-03-26 ENCOUNTER — Other Ambulatory Visit: Payer: Self-pay | Admitting: Internal Medicine

## 2017-04-20 ENCOUNTER — Encounter: Payer: Self-pay | Admitting: Cardiovascular Disease

## 2017-04-20 ENCOUNTER — Ambulatory Visit (INDEPENDENT_AMBULATORY_CARE_PROVIDER_SITE_OTHER): Payer: Medicare Other | Admitting: Cardiovascular Disease

## 2017-04-20 VITALS — BP 150/88 | HR 76 | Ht 67.0 in | Wt 167.0 lb

## 2017-04-20 DIAGNOSIS — Z951 Presence of aortocoronary bypass graft: Secondary | ICD-10-CM

## 2017-04-20 DIAGNOSIS — E7800 Pure hypercholesterolemia, unspecified: Secondary | ICD-10-CM

## 2017-04-20 MED ORDER — SIMVASTATIN 20 MG PO TABS: 20.0000 mg | ORAL_TABLET | Freq: Every day | ORAL | 1 refills | Status: DC

## 2017-04-20 MED ORDER — SIMVASTATIN 40 MG PO TABS: 40.0000 mg | ORAL_TABLET | Freq: Every day | ORAL | 1 refills | Status: DC

## 2017-04-20 NOTE — Patient Instructions (Signed)
Medication Instructions: START Simvastatin 20 mg daily   Labwork: Your physician recommends that you return for a FASTING lipid profile and hepatic function panel in 2 months.   Follow-Up: Your physician wants you to follow-up in: 1 year with Dr. Gwenlyn Found. You will receive a reminder letter in the mail two months in advance. If you don't receive a letter, please call our office to schedule the follow-up appointment.  If you need a refill on your cardiac medications before your next appointment, please call your pharmacy.

## 2017-04-20 NOTE — Progress Notes (Signed)
04/20/2017 Sheila Morgan   10/25/1932  [id number]  Primary Physician Binnie Rail, MD Primary Cardiologist: Lorretta Harp MD FACP, [place], [place], [place]  HPI:  Sheila Morgan is a 83 y.o.  mildly overweight married Sheila Morgan female mother of 2, grandmother to 4 grandchildren who I last saw 04/17/16.. I took care of her husband as well, who unfortunately passed away in Jul 02, 2014 after they've had been married for 54 years. She has a history of CAD status post remote 1-vessel coronary artery bypass grafting with a LIMA to her LAD in 1992/07/02. I re-catheterized her in November of 2002 revealing a patent LIMA to the LAD with high-grade distal RCA disease which I stented using a 2.75 x 8 mm long BX Philosophy bare metal stent. Her other problems include hypertension, hyperlipidemia, non-insulin-requiring diabetes. She had a Myoview stress test performed March 14, 2012 which was entirely normal. Since I saw her a year ago she's remained clinically stable and she continues to have atypical chest pain     Current Meds  Medication Sig  . alendronate (FOSAMAX) 70 MG tablet Take 70 mg by mouth once a week. Take with a full glass of water on an empty stomach.  Marland Kitchen amLODipine (NORVASC) 5 MG tablet TAKE 1 TABLET(5 MG) BY MOUTH DAILY  . aspirin 81 MG tablet Take 81 mg by mouth daily.  . Blood Glucose Monitoring Suppl (ONE TOUCH ULTRA MINI) W/DEVICE KIT Use to test blood sugar twice daily ICD 10 E11 21  . Calcium Carbonate-Vitamin D (CALCIUM 600-D PO) Take 1 tablet by mouth 2 (two) times daily.   . carvedilol (COREG) 25 MG tablet TAKE 1 TABLET(25 MG) BY MOUTH TWICE DAILY WITH A MEAL  . colchicine (COLCRYS) 0.6 MG tablet Take 0.6 mg by mouth every other day.   . furosemide (LASIX) 20 MG tablet TAKE 1 TABLET(20 MG) BY MOUTH DAILY  . isosorbide mononitrate (IMDUR) 30 MG 24 hr tablet TAKE 1/2 TABLET BY MOUTH DAILY  . levothyroxine (SYNTHROID, LEVOTHROID) 25 MCG tablet TAKE 1 TABLET(25 MCG) BY MOUTH  DAILY BEFORE BREAKFAST  . losartan (COZAAR) 100 MG tablet Take 1 tablet (100 mg total) by mouth daily.  . metFORMIN (GLUCOPHAGE) 500 MG tablet TAKE 1/2 TABLET BY MOUTH DAILY WITH LARGEST MEAL  . Multiple Vitamin (MULITIVITAMIN WITH MINERALS) TABS Take 1 tablet by mouth daily.  . ONE TOUCH ULTRA TEST test strip USE TO TEST BLOOD SUGAR TWICE DAILY  . ONETOUCH DELICA LANCETS 14G MISC TEST BLOOD SUGAR TWICE DAILY.  Marland Kitchen predniSONE (DELTASONE) 5 MG tablet Take 5 mg by mouth daily.  . vitamin B-12 (CYANOCOBALAMIN) 1000 MCG tablet Take 1,000 mcg by mouth daily.       No Known Allergies  Social History   Socioeconomic History  . Marital status: Married    Spouse name: Not on file  . Number of children: Not on file  . Years of education: Not on file  . Highest education level: Not on file  Social Needs  . Financial resource strain: Not on file  . Food insecurity - worry: Not on file  . Food insecurity - inability: Not on file  . Transportation needs - medical: Not on file  . Transportation needs - non-medical: Not on file  Occupational History  . Not on file  Tobacco Use  . Smoking status: Never Smoker  . Smokeless tobacco: Never Used  Substance and Sexual Activity  . Alcohol use: No  . Drug use: No  .  Sexual activity: Not Currently  Other Topics Concern  . Not on file  Social History Narrative  . Not on file     Review of Systems: General: negative for chills, fever, night sweats or weight changes.  Cardiovascular: negative for chest pain, dyspnea on exertion, edema, orthopnea, palpitations, paroxysmal nocturnal dyspnea or shortness of breath Dermatological: negative for rash Respiratory: negative for cough or wheezing Urologic: negative for hematuria Abdominal: negative for nausea, vomiting, diarrhea, bright red blood per rectum, melena, or hematemesis Neurologic: negative for visual changes, syncope, or dizziness All other systems reviewed and are otherwise negative except as  noted above.    Blood pressure (!) 150/88, pulse 76, height 5' 7" (1.702 m), weight 167 lb (75.8 kg).  General appearance: alert and no distress Neck: no adenopathy, no carotid bruit, no JVD, supple, symmetrical, trachea midline and thyroid not enlarged, symmetric, no tenderness/mass/nodules Lungs: clear to auscultation bilaterally Heart: regular rate and rhythm, S1, S2 normal, no murmur, click, rub or gallop Extremities: extremities normal, atraumatic, no cyanosis or edema Pulses: 2+ and symmetric Skin: Skin color, texture, turgor normal. No rashes or lesions Neurologic: Alert and oriented X 3, normal strength and tone. Normal symmetric reflexes. Normal coordination and gait  EKG sinus rhythm at 76 without ST or T-wave changes. I personally reviewed his EKG  ASSESSMENT AND PLAN:   Hyperlipidemia History of hyperlipidemia currently not on statin therapy with lipid profile performed 06/08/16 revealing a total cholesterol 232 which increased from 83 and LDL 132. Apparently she was asked to restart her statin drug but failed to do so. We will start her back on simvastatin and we'll recheck a lipid liver profile in 2 months.  Essential hypertension History of essential hypertension blood pressure measured today at 150/88. She is on amlodipine, carvedilol and losartan. Continue current meds at current dosing.  CORONARY ARTERY BYPASS GRAFT, HX OF History of CAD status post coronary artery bypass grafting 1 in 1994 (LIMA to LAD) as well as PCI and stenting of her RCA in 2002 (2.75 x 8 mm long BX velocity bare metal stent).. She denies chest pain or shortness of breath.      Lorretta Harp MD FACP,FACC,FAHA, [facility] 04/20/2017 9:10 AM

## 2017-04-20 NOTE — Assessment & Plan Note (Signed)
History of essential hypertension blood pressure measured today at 150/88. She is on amlodipine, carvedilol and losartan. Continue current meds at current dosing.

## 2017-04-20 NOTE — Assessment & Plan Note (Signed)
History of CAD status post coronary artery bypass grafting 1 in 1994 (LIMA to LAD) as well as PCI and stenting of her RCA in 2002 (2.75 x 8 mm long BX velocity bare metal stent).. She denies chest pain or shortness of breath.

## 2017-04-20 NOTE — Assessment & Plan Note (Signed)
History of hyperlipidemia currently not on statin therapy with lipid profile performed 06/08/16 revealing a total cholesterol 232 which increased from 83 and LDL 132. Apparently she was asked to restart her statin drug but failed to do so. We will start her back on simvastatin and we'll recheck a lipid liver profile in 2 months.

## 2017-04-23 ENCOUNTER — Ambulatory Visit: Payer: Medicare Other | Admitting: Podiatry

## 2017-05-05 ENCOUNTER — Other Ambulatory Visit: Payer: Self-pay | Admitting: Cardiovascular Disease

## 2017-05-16 ENCOUNTER — Ambulatory Visit (HOSPITAL_COMMUNITY)
Admission: EM | Admit: 2017-05-16 | Discharge: 2017-05-16 | Disposition: A | Discharge status: Home / Self Care | Payer: Medicare Other | Attending: Internal Medicine | Admitting: Internal Medicine

## 2017-05-16 ENCOUNTER — Other Ambulatory Visit: Payer: Self-pay | Admitting: Internal Medicine

## 2017-05-16 ENCOUNTER — Ambulatory Visit (INDEPENDENT_AMBULATORY_CARE_PROVIDER_SITE_OTHER): Payer: Medicare Other

## 2017-05-16 ENCOUNTER — Other Ambulatory Visit: Payer: Self-pay

## 2017-05-16 ENCOUNTER — Encounter (HOSPITAL_COMMUNITY): Payer: Self-pay | Admitting: *Deleted

## 2017-05-16 DIAGNOSIS — J22 Unspecified acute lower respiratory infection: Secondary | ICD-10-CM | POA: Diagnosis not present

## 2017-05-16 DIAGNOSIS — R0602 Shortness of breath: Secondary | ICD-10-CM

## 2017-05-16 DIAGNOSIS — R05 Cough: Secondary | ICD-10-CM | POA: Diagnosis not present

## 2017-05-16 DIAGNOSIS — R059 Cough, unspecified: Secondary | ICD-10-CM

## 2017-05-16 HISTORY — DX: Acute myocardial infarction, unspecified: I21.9

## 2017-05-16 MED ORDER — IPRATROPIUM-ALBUTEROL 0.5-2.5 (3) MG/3ML IN SOLN
3.0000 mL | Freq: Once | RESPIRATORY_TRACT | Status: AC
  Administered 2017-05-16: 3 mL via RESPIRATORY_TRACT

## 2017-05-16 MED ORDER — IPRATROPIUM-ALBUTEROL 0.5-2.5 (3) MG/3ML IN SOLN
RESPIRATORY_TRACT | Status: AC
  Filled 2017-05-16: qty 3

## 2017-05-16 MED ORDER — AZITHROMYCIN 250 MG PO TABS: 250.0000 mg | ORAL_TABLET | Freq: Every day | ORAL | 0 refills | Status: DC

## 2017-05-16 MED ORDER — METHYLPREDNISOLONE SODIUM SUCC 125 MG IJ SOLR
125.0000 mg | Freq: Once | INTRAMUSCULAR | Status: AC
  Administered 2017-05-16: 125 mg via INTRAMUSCULAR

## 2017-05-16 MED ORDER — METHYLPREDNISOLONE SODIUM SUCC 125 MG IJ SOLR
INTRAMUSCULAR | Status: AC
  Filled 2017-05-16: qty 2

## 2017-05-16 MED ORDER — ALBUTEROL SULFATE HFA 108 (90 BASE) MCG/ACT IN AERS: 1.0000 | INHALATION_SPRAY | RESPIRATORY_TRACT | 0 refills | Status: DC | PRN

## 2017-05-16 NOTE — ED Provider Notes (Signed)
[place]    CSN: [id number] Arrival date & time: 05/16/17  1201     History   Chief Complaint Chief Complaint  Patient presents with  . Cough  . Shortness of Breath    HPI Sheila Morgan is a 84 y.o. female.   She presents today with cough and dyspnea that started last week, appeared to improve, now is worse again for the last day or 2.  No runny/congested nose, no sore throat.  No fever.  She does feel weak.  Poor appetite.  No leg swelling.  Feels some chest heaviness when she tries to lie down at night, on the same number of pillows that she usually uses.  Cough is nonproductive.  Chronic nausea, chronic lightheadedness, neither is changed.  No vomiting.   HPI  Past Medical History:  Diagnosis Date  . Arthritis   . Cancer ([place])    skin on nose .  mylenoma  . Coronary artery disease    status post post LAD angioplasty in 1993  and subsequent coronary artery bypass grafting x1 with a LIMA to the LAD July of 1994. She had stenting of her RCA with a bare-metal stent November 2002.  . Diabetes mellitus    2  . Gout   . Heart murmur   . Hematoma    Right groin  . Hemorrhoids   . Hiatal hernia   . History of blood transfusion   . HOH (hard of hearing)   . Hyperlipidemia   . Hypertension   . Left knee DJD   . Lightheadedness    has seen Dr Gwenlyn Found and ENT.. cant find out why  . Myocardial infarction ([place])   . Osteoporosis   . PONV (postoperative nausea and vomiting)     Patient Active Problem List   Diagnosis Date Noted  . Multiple closed fractures of ribs of right side 01/19/2017  . Acute pain of right shoulder 01/05/2017  . Rib pain on right side 01/05/2017  . Acute pain of right knee 01/05/2017  . B12 deficiency 06/13/2015  . Hypothyroidism 06/13/2015  . DM (diabetes mellitus) type II, controlled, with peripheral vascular disorder ([place]) 03/06/2014  . Shingles 10/27/2013  . Gout 10/15/2013  . Arthritis   . Hiatal hernia   . Coronary artery  disease   . Left knee DJD   . Benign paroxysmal positional vertigo 05/19/2013  . Weakness generalized 05/09/2013  . Pseudogout of left knee 03/29/2013  . Acute right lumbar radiculopathy 03/18/2013  . Shoulder impingement syndrome 01/25/2013  . Vertigo 10/17/2012  . Renal insufficiency, mild 06/20/2011  . Essential hypertension 12/31/2009  . Osteoporosis 12/31/2009  . SKIN CANCER, HX OF 12/31/2009  . Proteinuria 01/05/2008  . UNSPECIFIED ANEMIA 12/21/2007  . Hyperlipidemia 09/08/2006  . Polymyalgia rheumatica ([place]) 09/08/2006  . CORONARY ARTERY BYPASS GRAFT, HX OF 09/08/2006    Past Surgical History:  Procedure Laterality Date  . CORONARY ANGIOPLASTY  1993   LAD  . CORONARY ANGIOPLASTY WITH STENT PLACEMENT  03/09/01   RCA  . CORONARY ARTERY BYPASS GRAFT  1994  . JOINT REPLACEMENT Right 1998   hip  . NM MYOVIEW LTD  03/2012   Normal  . SKIN GRAFT     to nose skin cancer  . TOTAL HIP ARTHROPLASTY    . TOTAL KNEE ARTHROPLASTY Left 10/09/2013   Procedure: TOTAL KNEE ARTHROPLASTY;  Surgeon: Lorn Junes, MD;  Location: [facility];  Service: Orthopedics;  Laterality: Left;     Home  Medications    Prior to Admission medications   Medication Sig Start Date End Date Taking? Authorizing Provider  alendronate (FOSAMAX) 70 MG tablet Take 70 mg by mouth once a week. Take with a full glass of water on an empty stomach.   Yes [provider]  amLODipine (NORVASC) 5 MG tablet TAKE 1 TABLET(5 MG) BY MOUTH DAILY 08/28/16  Yes Burns, Claudina Lick, MD  aspirin 81 MG tablet Take 81 mg by mouth daily.   Yes [provider]  Calcium Carbonate-Vitamin D (CALCIUM 600-D PO) Take 1 tablet by mouth 2 (two) times daily.    Yes [provider]  carvedilol (COREG) 25 MG tablet TAKE 1 TABLET(25 MG) BY MOUTH TWICE DAILY WITH A MEAL 06/08/16  Yes Burns, Claudina Lick, MD  colchicine (COLCRYS) 0.6 MG tablet Take 0.6 mg by mouth every other day.    Yes [provider]  furosemide (LASIX)  20 MG tablet TAKE 1 TABLET(20 MG) BY MOUTH DAILY 03/23/17  Yes Burns, Claudina Lick, MD  isosorbide mononitrate (IMDUR) 30 MG 24 hr tablet TAKE 1/2 TABLET BY MOUTH DAILY 05/06/17  Yes Lorretta Harp, MD  losartan (COZAAR) 100 MG tablet Take 1 tablet (100 mg total) by mouth daily. 12/22/16  Yes Burns, Claudina Lick, MD  Multiple Vitamin (MULITIVITAMIN WITH MINERALS) TABS Take 1 tablet by mouth daily.   Yes [provider]  predniSONE (DELTASONE) 5 MG tablet Take 5 mg by mouth daily.   Yes [provider]  simvastatin (ZOCOR) 20 MG tablet Take 1 tablet (20 mg total) by mouth at bedtime. 04/20/17 10/17/17 Yes Lorretta Harp, MD  vitamin B-12 (CYANOCOBALAMIN) 1000 MCG tablet Take 1,000 mcg by mouth daily.     Yes [provider]  albuterol (PROVENTIL HFA;VENTOLIN HFA) 108 (90 Base) MCG/ACT inhaler Inhale 1-2 puffs into the lungs every 4 (four) hours as needed for wheezing or shortness of breath. 05/16/17   Wynona Luna, MD  azithromycin (ZITHROMAX) 250 MG tablet Take 1 tablet (250 mg total) by mouth daily. Take first 2 tablets together, then 1 every day until finished. 05/16/17   Wynona Luna, MD  Blood Glucose Monitoring Suppl (ONE TOUCH ULTRA MINI) W/DEVICE KIT Use to test blood sugar twice daily ICD 10 E11 21 08/20/14   Hendricks Limes, MD  levothyroxine (SYNTHROID, LEVOTHROID) 25 MCG tablet TAKE 1 TABLET(25 MCG) BY MOUTH DAILY BEFORE BREAKFAST 05/17/17   Binnie Rail, MD  metFORMIN (GLUCOPHAGE) 500 MG tablet TAKE 1/2 TABLET BY MOUTH DAILY WITH LARGEST MEAL 05/17/17   Binnie Rail, MD  ONE TOUCH ULTRA TEST test strip USE TO TEST BLOOD SUGAR TWICE DAILY 06/15/16   Burns, Claudina Lick, MD  [facility] DELICA LANCETS 84O MISC TEST BLOOD SUGAR TWICE DAILY. 03/29/17   Binnie Rail, MD    Family History Family History  Problem Relation Age of Onset  . Heart disease Father   . Cancer Sister        breast  . Diabetes Sister   . Heart disease Brother   . Heart disease Brother    . Heart disease Brother   . Diabetes Sister     Social History Social History   Tobacco Use  . Smoking status: Never Smoker  . Smokeless tobacco: Never Used  Substance Use Topics  . Alcohol use: No  . Drug use: No     Allergies   Patient has no known allergies.   Review of Systems Review of Systems  All other systems reviewed and are negative.    Physical Exam Triage Vital Signs ED Triage Vitals [05/16/17 1215]  Enc Vitals Group     BP (!) 147/70     Pulse Rate 79     Resp (!) 24     Temp 97.7 F (36.5 C)     Temp Source Oral     SpO2 93 %     Weight      Height      Pain Score      Pain Loc    Updated Vital Signs BP (!) 147/70   Pulse 79   Temp 97.7 F (36.5 C) (Oral)   Resp (!) 24   SpO2 93%  Physical Exam  Constitutional: She is oriented to person, place, and time. No distress.  Able to walk into facility with her cane  HENT:  Head: Atraumatic.  Eyes:  Conjugate gaze observed, no eye redness/discharge  Neck:  Turns head freely  Cardiovascular: Normal rate and regular rhythm.  Heart rhythm is irregularly irregular, patient says she does tend to skip beats  Pulmonary/Chest: No respiratory distress. She has no wheezes. She has no rales.  Bibasilar crackles, to about a quarter of the way up bilaterally  Abdominal: She exhibits no distension.  Musculoskeletal: Normal range of motion.  Trace bilateral pitting edema to the midshin, no change per patient  Neurological: She is alert and oriented to person, place, and time.  Skin: Skin is warm and dry.  Slightly pale  Nursing note and vitals reviewed.    UC Treatments / Results   EKG NSR, no acute ST or T wave changes, no ectopy  Radiology Dg Chest 2 View  Result Date: 05/16/2017 CLINICAL DATA:  Shortness of breath, cough EXAM: CHEST  2 VIEW COMPARISON:  01/05/2017 FINDINGS: Prior CABG. Bibasilar atelectasis. There is hyperinflation of the lungs compatible with COPD. No acute bony  abnormality. IMPRESSION: COPD.  Cardiomegaly and bibasilar atelectasis. Electronically Signed   By: Rolm Baptise M.D.   On: 05/16/2017 12:55    Procedures Procedures (including critical care time)  Medications Ordered in UC Medications  ipratropium-albuterol (DUONEB) 0.5-2.5 (3) MG/3ML nebulizer solution 3 mL (3 mLs Nebulization Given 05/16/17 1400)  methylPREDNISolone sodium succinate (SOLU-MEDROL) 125 mg/2 mL injection 125 mg (125 mg Intramuscular Given 05/16/17 1400)     Initial Impression / Assessment and Plan / UC Course    Chest x-ray with bibasilar atelectasis, cardiomegaly, changes consistent with COPD. Suspect COPD exacerbation/lower resp infection more so than CHF Will give a breathing treatment and Solu-Medrol injection.  Mild relief of dyspnea with duoneb.  Final Clinical Impressions(s) / UC Diagnoses   Final diagnoses:  Cough  Lower respiratory infection   ECG today was normal.  Chest xray did not clearly suggest pneumonia or heart failure.  A breathing treatment seemed a little bit helpful in relieving breathlessness.  Injection of Solu-Medrol (steroid) was given at the urgent care, and prescriptions for Zithromax (antibiotic) and albuterol inhaler (to help with breathing, cough) were sent to the pharmacy. Recheck or followup with your primary care provider for new fever >100.5, increasing phlegm production/nasal discharge, or if not starting to improve in a few days.     ED Discharge Orders        Ordered    azithromycin (ZITHROMAX) 250 MG tablet  Daily     05/16/17 1439    albuterol (PROVENTIL HFA;VENTOLIN HFA) 108 (90 Base) MCG/ACT inhaler  Every 4 hours PRN  Comments:  Please dispense with spacer device and instruct patient in use   05/16/17 1439          Wynona Luna, MD 05/17/17 1010

## 2017-05-16 NOTE — Discharge Instructions (Addendum)
ECG today was normal.  Chest xray did not clearly suggest pneumonia or heart failure.  A breathing treatment seemed a little bit helpful in relieving breathlessness.  Injection of Solu-Medrol (steroid) was given at the urgent care, and prescriptions for Zithromax (antibiotic) and albuterol inhaler (to help with breathing, cough) were sent to the pharmacy. Recheck or followup with your primary care provider for new fever >100.5, increasing phlegm production/nasal discharge, or if not starting to improve in a few days.

## 2017-05-16 NOTE — ED Triage Notes (Signed)
C/O cough, congestion, cold sxs x approx 1 wk; was having some improvement, but started getting worse over past couple days.  Today C/O dyspnea; continues with non-productive cough.  States laying flat feels "like a lot of pressure" in chest.  Denies fevers.

## 2017-05-28 DIAGNOSIS — E119 Type 2 diabetes mellitus without complications: Secondary | ICD-10-CM | POA: Diagnosis not present

## 2017-05-28 DIAGNOSIS — H04123 Dry eye syndrome of bilateral lacrimal glands: Secondary | ICD-10-CM | POA: Diagnosis not present

## 2017-05-28 DIAGNOSIS — H349 Unspecified retinal vascular occlusion: Secondary | ICD-10-CM | POA: Diagnosis not present

## 2017-06-01 LAB — HM DIABETES EYE EXAM

## 2017-06-07 ENCOUNTER — Other Ambulatory Visit: Payer: Self-pay | Admitting: Internal Medicine

## 2017-06-11 ENCOUNTER — Encounter: Payer: Self-pay | Admitting: Internal Medicine

## 2017-06-17 DIAGNOSIS — E7800 Pure hypercholesterolemia, unspecified: Secondary | ICD-10-CM | POA: Diagnosis not present

## 2017-06-17 LAB — HEPATIC FUNCTION PANEL
ALK PHOS: 93 IU/L (ref 39–117)
ALT: 15 IU/L (ref 0–32)
AST: 18 IU/L (ref 0–40)
Albumin: 4.5 g/dL (ref 3.5–4.7)
BILIRUBIN, DIRECT: 0.2 mg/dL (ref 0.00–0.40)
Bilirubin Total: 0.6 mg/dL (ref 0.0–1.2)
Total Protein: 7.2 g/dL (ref 6.0–8.5)

## 2017-06-17 LAB — LIPID PANEL
CHOLESTEROL TOTAL: 196 mg/dL (ref 100–199)
Chol/HDL Ratio: 2.4 ratio (ref 0.0–4.4)
HDL: 82 mg/dL (ref >39)
LDL CALC: 91 mg/dL (ref 0–99)
TRIGLYCERIDES: 115 mg/dL (ref 0–149)
VLDL Cholesterol Cal: 23 mg/dL (ref 5–40)

## 2017-06-21 ENCOUNTER — Other Ambulatory Visit: Payer: Self-pay | Admitting: Internal Medicine

## 2017-06-25 ENCOUNTER — Ambulatory Visit: Payer: Medicare Other

## 2017-06-25 ENCOUNTER — Ambulatory Visit: Payer: Medicare Other | Admitting: Internal Medicine

## 2017-07-01 NOTE — Patient Instructions (Addendum)
If you continue to have chest discomfort at times call cardiology, Dr Gwenlyn Found.   Test(s) ordered today. Your results will be released to [place] (or called to you) after review, usually within 72hours after test completion. If any changes need to be made, you will be notified at that same time.  All other Health Maintenance issues reviewed.   All recommended immunizations and age-appropriate screenings are up-to-date or discussed.  No immunizations administered today.   Medications reviewed and updated.  Changes include increasing your Imdur to one full pill a day (30 mg).    Your prescription(s) have been submitted to your pharmacy. Please take as directed and contact our office if you believe you are having problem(s) with the medication(s).   Please followup in 6 months

## 2017-07-01 NOTE — Progress Notes (Signed)
  Subjective:    Patient ID: Sheila Morgan, female    DOB: 09/08/1932, 84 y.o.   MRN: [id number]  HPI The patient is here for follow up.  Diabetes: She is taking her medication daily as prescribed. She is not always compliant with a diabetic diet. She is not exercising regularly. She monitors her sugars and they have been running 150, 140, 160.  She is up-to-date with an ophthalmology examination.   CAD, Hypertension: She is taking her medication daily. She is not always compliant with a low sodium diet.  She has had a couple instances of chest pain with exertion.  She has not discussed this with cardiology yet.  She denies palpitations, edema, shortness of breath and regular headaches. She is not exercising regularly.  She does monitor her blood pressure at home, 140-170's/?.    Hypothyroidism:  She is taking her medication daily.  She denies any recent changes in energy or weight that are unexplained.   Hyperlipidemia: She is taking her medication daily. She is somewhat compliant with a low fat/cholesterol diet. She is not exercising regularly.    Medications and allergies reviewed with patient and updated if appropriate.  Patient Active Problem List   Diagnosis Date Noted  . Multiple closed fractures of ribs of right side 01/19/2017  . Acute pain of right shoulder 01/05/2017  . Rib pain on right side 01/05/2017  . Acute pain of right knee 01/05/2017  . B12 deficiency 06/13/2015  . Hypothyroidism 06/13/2015  . DM (diabetes mellitus) type II, controlled, with peripheral vascular disorder ([facility]) 03/06/2014  . Shingles 10/27/2013  . Gout 10/15/2013  . Arthritis   . Hiatal hernia   . Coronary artery disease   . Left knee DJD   . Benign paroxysmal positional vertigo 05/19/2013  . Weakness generalized 05/09/2013  . Pseudogout of left knee 03/29/2013  . Acute right lumbar radiculopathy 03/18/2013  . Shoulder impingement syndrome 01/25/2013  . Vertigo 10/17/2012  . Renal  insufficiency, mild 06/20/2011  . Essential hypertension 12/31/2009  . Osteoporosis 12/31/2009  . SKIN CANCER, HX OF 12/31/2009  . Proteinuria 01/05/2008  . UNSPECIFIED ANEMIA 12/21/2007  . Hyperlipidemia 09/08/2006  . Polymyalgia rheumatica ([facility]) 09/08/2006  . CORONARY ARTERY BYPASS GRAFT, HX OF 09/08/2006    Current Outpatient Medications on File Prior to Visit  Medication Sig Dispense Refill  . albuterol (PROVENTIL HFA;VENTOLIN HFA) 108 (90 Base) MCG/ACT inhaler Inhale 1-2 puffs into the lungs every 4 (four) hours as needed for wheezing or shortness of breath. 1 Inhaler 0  . alendronate (FOSAMAX) 70 MG tablet Take 70 mg by mouth once a week. Take with a full glass of water on an empty stomach.    . amLODipine (NORVASC) 5 MG tablet TAKE 1 TABLET(5 MG) BY MOUTH DAILY 90 tablet 1  . aspirin 81 MG tablet Take 81 mg by mouth daily.    . Blood Glucose Monitoring Suppl (ONE TOUCH ULTRA MINI) W/DEVICE KIT Use to test blood sugar twice daily ICD 10 E11 21 1 each 0  . Calcium Carbonate-Vitamin D (CALCIUM 600-D PO) Take 1 tablet by mouth 2 (two) times daily.     . carvedilol (COREG) 25 MG tablet TAKE 1 TABLET(25 MG) BY MOUTH TWICE DAILY WITH A MEAL 180 tablet 0  . colchicine (COLCRYS) 0.6 MG tablet Take 0.6 mg by mouth every other day.     . furosemide (LASIX) 20 MG tablet TAKE 1 TABLET(20 MG) BY MOUTH DAILY 90 tablet 1  . isosorbide mononitrate (  IMDUR) 30 MG 24 hr tablet TAKE 1/2 TABLET BY MOUTH DAILY 30 tablet 11  . levothyroxine (SYNTHROID, LEVOTHROID) 25 MCG tablet TAKE 1 TABLET(25 MCG) BY MOUTH DAILY BEFORE BREAKFAST 90 tablet 1  . losartan (COZAAR) 100 MG tablet Take 1 tablet (100 mg total) by mouth daily. 90 tablet 3  . metFORMIN (GLUCOPHAGE) 500 MG tablet TAKE 1/2 TABLET BY MOUTH DAILY WITH LARGEST MEAL 45 tablet 1  . Multiple Vitamin (MULITIVITAMIN WITH MINERALS) TABS Take 1 tablet by mouth daily.    . ONE TOUCH ULTRA TEST test strip USE TO TEST BLOOD SUGAR TWICE DAILY 300 each 1  .  ONETOUCH DELICA LANCETS 25E MISC TEST BLOOD SUGAR TWICE DAILY. 200 each 1  . predniSONE (DELTASONE) 5 MG tablet Take 5 mg by mouth daily.    . simvastatin (ZOCOR) 20 MG tablet Take 1 tablet (20 mg total) by mouth at bedtime. 90 tablet 1  . vitamin B-12 (CYANOCOBALAMIN) 1000 MCG tablet Take 1,000 mcg by mouth daily.       No current facility-administered medications on file prior to visit.     Past Medical History:  Diagnosis Date  . Arthritis   . Cancer ([place])    skin on nose .  mylenoma  . Coronary artery disease    status post post LAD angioplasty in 1993  and subsequent coronary artery bypass grafting x1 with a LIMA to the LAD July of 1994. She had stenting of her RCA with a bare-metal stent November 2002.  . Diabetes mellitus    2  . Gout   . Heart murmur   . Hematoma    Right groin  . Hemorrhoids   . Hiatal hernia   . History of blood transfusion   . HOH (hard of hearing)   . Hyperlipidemia   . Hypertension   . Left knee DJD   . Lightheadedness    has seen Dr Gwenlyn Found and ENT.. cant find out why  . Myocardial infarction ([place])   . Osteoporosis   . PONV (postoperative nausea and vomiting)     Past Surgical History:  Procedure Laterality Date  . CORONARY ANGIOPLASTY  1993   LAD  . CORONARY ANGIOPLASTY WITH STENT PLACEMENT  03/09/01   RCA  . CORONARY ARTERY BYPASS GRAFT  1994  . JOINT REPLACEMENT Right 1998   hip  . NM MYOVIEW LTD  03/2012   Normal  . SKIN GRAFT     to nose skin cancer  . TOTAL HIP ARTHROPLASTY    . TOTAL KNEE ARTHROPLASTY Left 10/09/2013   Procedure: TOTAL KNEE ARTHROPLASTY;  Surgeon: Lorn Junes, MD;  Location: [facility];  Service: Orthopedics;  Laterality: Left;    Social History   Socioeconomic History  . Marital status: Married    Spouse name: None  . Number of children: None  . Years of education: None  . Highest education level: None  Social Needs  . Financial resource strain: None  . Food insecurity - worry: None  . Food insecurity -  inability: None  . Transportation needs - medical: None  . Transportation needs - non-medical: None  Occupational History  . None  Tobacco Use  . Smoking status: Never Smoker  . Smokeless tobacco: Never Used  Substance and Sexual Activity  . Alcohol use: No  . Drug use: No  . Sexual activity: None  Other Topics Concern  . None  Social History Narrative  . None    Family History  Problem Relation Age  of Onset  . Heart disease Father   . Cancer Sister        breast  . Diabetes Sister   . Heart disease Brother   . Heart disease Brother   . Heart disease Brother   . Diabetes Sister     Review of Systems  Constitutional: Positive for fatigue. Negative for appetite change, chills and fever.  Respiratory: Negative for cough, shortness of breath and wheezing.   Cardiovascular: Positive for chest pain (occ, new in past couple of months, occurs with exertion, transient). Negative for palpitations and leg swelling.  Neurological: Positive for dizziness. Negative for light-headedness and headaches.       Objective:   Vitals:   07/02/17 0958  BP: (!) 174/92  Pulse: 74  Resp: 16  Temp: 98.4 F (36.9 C)  SpO2: 97%   Wt Readings from Last 3 Encounters:  07/02/17 161 lb (73 kg)  04/20/17 167 lb (75.8 kg)  01/19/17 163 lb (73.9 kg)   Body mass index is 25.22 kg/m.   Physical Exam    Constitutional: Appears well-developed and well-nourished. No distress.  HENT:  Head: Normocephalic and atraumatic.  Neck: Neck supple. No tracheal deviation present. No thyromegaly present.  No cervical lymphadenopathy Cardiovascular: Normal rate, regular rhythm and normal heart sounds.   No murmur heard. No carotid bruit .  No edema Pulmonary/Chest: Effort normal and breath sounds normal. No respiratory distress. No has no wheezes. No rales.  Skin: Skin is warm and dry. Not diaphoretic.  Psychiatric: Normal mood and affect. Behavior is normal.      Assessment & Plan:    See  Problem List for Assessment and Plan of chronic medical problems.

## 2017-07-02 ENCOUNTER — Ambulatory Visit (INDEPENDENT_AMBULATORY_CARE_PROVIDER_SITE_OTHER): Payer: Medicare Other | Admitting: Internal Medicine

## 2017-07-02 ENCOUNTER — Other Ambulatory Visit (INDEPENDENT_AMBULATORY_CARE_PROVIDER_SITE_OTHER): Payer: Medicare Other

## 2017-07-02 ENCOUNTER — Encounter: Payer: Self-pay | Admitting: Internal Medicine

## 2017-07-02 ENCOUNTER — Ambulatory Visit (INDEPENDENT_AMBULATORY_CARE_PROVIDER_SITE_OTHER): Payer: Medicare Other | Admitting: *Deleted

## 2017-07-02 VITALS — BP 174/92 | HR 74 | Resp 16 | Ht 67.0 in | Wt 161.0 lb

## 2017-07-02 VITALS — BP 174/92 | HR 74 | Temp 98.4°F | Resp 16 | Wt 161.0 lb

## 2017-07-02 DIAGNOSIS — Z0000 Encounter for general adult medical examination without abnormal findings: Secondary | ICD-10-CM | POA: Diagnosis not present

## 2017-07-02 DIAGNOSIS — I2510 Atherosclerotic heart disease of native coronary artery without angina pectoris: Secondary | ICD-10-CM

## 2017-07-02 DIAGNOSIS — I10 Essential (primary) hypertension: Secondary | ICD-10-CM | POA: Diagnosis not present

## 2017-07-02 DIAGNOSIS — E1151 Type 2 diabetes mellitus with diabetic peripheral angiopathy without gangrene: Secondary | ICD-10-CM

## 2017-07-02 DIAGNOSIS — E7849 Other hyperlipidemia: Secondary | ICD-10-CM | POA: Diagnosis not present

## 2017-07-02 DIAGNOSIS — E038 Other specified hypothyroidism: Secondary | ICD-10-CM

## 2017-07-02 DIAGNOSIS — I2583 Coronary atherosclerosis due to lipid rich plaque: Secondary | ICD-10-CM | POA: Diagnosis not present

## 2017-07-02 LAB — COMPREHENSIVE METABOLIC PANEL
ALBUMIN: 3.9 g/dL (ref 3.5–5.2)
ALK PHOS: 77 U/L (ref 39–117)
ALT: 14 U/L (ref 0–35)
AST: 17 U/L (ref 0–37)
BILIRUBIN TOTAL: 0.6 mg/dL (ref 0.2–1.2)
BUN: 23 mg/dL (ref 6–23)
CO2: 35 mEq/L — ABNORMAL HIGH (ref 19–32)
Calcium: 11.9 mg/dL — ABNORMAL HIGH (ref 8.4–10.5)
Chloride: 100 mEq/L (ref 96–112)
Creatinine, Ser: 1.24 mg/dL — ABNORMAL HIGH (ref 0.40–1.20)
GFR: 53.16 mL/min — AB (ref >60.00)
Glucose, Bld: 117 mg/dL — ABNORMAL HIGH (ref 70–99)
Potassium: 4 mEq/L (ref 3.5–5.1)
Sodium: 144 mEq/L (ref 135–145)
TOTAL PROTEIN: 7.3 g/dL (ref 6.0–8.3)

## 2017-07-02 LAB — HEMOGLOBIN A1C: Hgb A1c MFr Bld: 6.8 % — ABNORMAL HIGH (ref 4.6–6.5)

## 2017-07-02 LAB — TSH: TSH: 4.74 u[IU]/mL — ABNORMAL HIGH (ref 0.35–4.50)

## 2017-07-02 MED ORDER — ISOSORBIDE MONONITRATE ER 30 MG PO TB24: 30.0000 mg | ORAL_TABLET | Freq: Every day | ORAL | 3 refills | Status: DC

## 2017-07-02 NOTE — Assessment & Plan Note (Signed)
Not completely compliant with a diabetic diet Currently taking metformin We will check A1c and adjust medication if needed

## 2017-07-02 NOTE — Assessment & Plan Note (Signed)
Blood pressure elevated at home and elevated here Continue current doses of amlodipine, labetalol and losartan Given her few episodes of chest pain will increase her imdur to 1 full pill or 30 mg daily-hopefully this will lower her blood pressure improve her chest pain Advised her to discuss her chest pain with cardiology Continue to monitor blood pressure at home

## 2017-07-02 NOTE — Assessment & Plan Note (Signed)
She has had a few episodes of chest pain and I will increase her imdur to 1 full pill or 30 mg daily-hopefully this will lower her blood pressure improve her chest pain Advised her to discuss her chest pain with cardiology Continue to monitor blood pressure at home

## 2017-07-02 NOTE — Patient Instructions (Addendum)
Start doing brain stimulating activities (puzzles, reading, adult coloring books, staying active) to keep memory sharp.   Continue to eat heart healthy diet (full of fruits, vegetables, whole grains, lean protein, water--limit salt, fat, and sugar intake) and increase physical activity as tolerated.    Sheila Morgan , Thank you for taking time to come for your Medicare Wellness Visit. I appreciate your ongoing commitment to your health goals. Please review the following plan we discussed and let me know if I can assist you in the future.   These are the goals we discussed: Goals    . Patient Stated     Set out 2-3 bottles of water daily, put it by my TV chair to remind me to drink. Stay as healthy and as independent as possible       This is a list of the screening recommended for you and due dates:  Health Maintenance  Topic Date Due  . Hemoglobin A1C  06/24/2017  . Complete foot exam   01/22/2018  . Eye exam for diabetics  06/01/2018  . Tetanus Vaccine  01/08/2020  . Flu Shot  Completed  . DEXA scan (bone density measurement)  Completed  . Pneumonia vaccines  Completed   It is important to avoid accidents which may result in broken bones.  Here are a few ideas on how to make your home safer so you will be less likely to trip or fall.  1. Use nonskid mats or non slip strips in your shower or tub, on your bathroom floor and around sinks.  If you know that you have spilled water, wipe it up! 2. In the bathroom, it is important to have properly installed grab bars on the walls or on the edge of the tub.  Towel racks are NOT strong enough for you to hold onto or to pull on for support. 3. Stairs and hallways should have enough light.  Add lamps or night lights if you need ore light. 4. It is good to have handrails on both sides of the stairs if possible.  Always fix broken handrails right away. 5. It is important to see the edges of steps.  Paint the edges of outdoor steps white so you can  see them better.  Put colored tape on the edge of inside steps. 6. Throw-rugs are dangerous because they can slide.  Removing the rugs is the best idea, but if they must stay, add adhesive carpet tape to prevent slipping. 7. Do not keep things on stairs or in the halls.  Remove small furniture that blocks the halls as it may cause you to trip.  Keep telephone and electrical cords out of the way where you walk. 8. Always were sturdy, rubber-soled shoes for good support.  Never wear just socks, especially on the stairs.  Socks may cause you to slip or fall.  Do not wear full-length housecoats as you can easily trip on the bottom.  9. Place the things you use the most on the shelves that are the easiest to reach.  If you use a stepstool, make sure it is in good condition.  If you feel unsteady, DO NOT climb, ask for help. 10. If a health professional advises you to use a cane or walker, do not be ashamed.  These items can keep you from falling and breaking your bones.

## 2017-07-02 NOTE — Assessment & Plan Note (Signed)
Check tsh  Titrate med dose if needed  

## 2017-07-02 NOTE — Assessment & Plan Note (Signed)
Taking simvastatin 40 mg daily Cholesterol checked recently by cardiology and it was well controlled Continue same

## 2017-07-02 NOTE — Progress Notes (Addendum)
Subjective:   Sheila Morgan is a 84 y.o. female who presents for Medicare Annual (Subsequent) preventive examination.  Review of Systems:  No ROS.  Medicare Wellness Visit. Additional risk factors are reflected in the social history.  Cardiac Risk Factors include: advanced age (>84mn, >>28women);diabetes mellitus;dyslipidemia;hypertension;sedentary lifestyle  Sleep patterns: feels rested on waking, gets up 1-2 times nightly to void and sleeps 7-8 hours nightly.   Home Safety/Smoke Alarms: Feels safe in home. Smoke alarms in place.  Living environment; residence and Firearm Safety: 2-story house, equipment: CRadio producer Type: [place] Type: RConservation officer, nature HOmnicom Type: Tub Seat/Chair and wheelchair, BSC, and tub bench, no firearms. Seat Belt Safety/Bike Helmet: Wears seat belt.     Objective:     Vitals: BP (!) 174/92   Pulse 74   Resp 16   Ht 5' 7"  (1.702 m)   Wt 161 lb (73 kg)   SpO2 97%   BMI 25.22 kg/m   Body mass index is 25.22 kg/m.  Advanced Directives 07/02/2017 12/23/2015 10/09/2013 09/28/2013 05/09/2013 06/19/2011  Does Patient Have a Medical Advance Directive? No Yes Patient does not have advance directive;Patient would like information Patient does not have advance directive;Patient would like information Patient does not have advance directive;Patient would not like information Patient has advance directive, copy not in chart  Type of Advance Directive - - - - - [place] will  Copy of HSharkeyin Chart? - Yes - - - Copy requested from family  Would patient like information on creating a medical advance directive? Yes (ED - Information included in AVS) - - Advance directive packet given - -  Pre-existing out of facility DNR order (yellow form or pink MOST form) - - - No - No    Tobacco Social History   Tobacco Use  Smoking Status Never Smoker  Smokeless Tobacco Never Used     Counseling given: Not  Answered  Past Medical History:  Diagnosis Date  . Arthritis   . Cancer ([place]    skin on nose .  mylenoma  . Coronary artery disease    status post post LAD angioplasty in 1993  and subsequent coronary artery bypass grafting x1 with a LIMA to the LAD July of 1994. She had stenting of her RCA with a bare-metal stent November 2002.  . Diabetes mellitus    2  . Gout   . Heart murmur   . Hematoma    Right groin  . Hemorrhoids   . Hiatal hernia   . History of blood transfusion   . HOH (hard of hearing)   . Hyperlipidemia   . Hypertension   . Left knee DJD   . Lightheadedness    has seen Dr BGwenlyn Foundand ENT.. cant find out why  . [place]ocardial infarction ([place]   . Osteoporosis   . PONV (postoperative nausea and vomiting)   . Shingles 10/27/2013   Past Surgical History:  Procedure Laterality Date  . CORONARY ANGIOPLASTY  1993   LAD  . CORONARY ANGIOPLASTY WITH STENT PLACEMENT  03/09/01   RCA  . CORONARY ARTERY BYPASS GRAFT  1994  . JOINT REPLACEMENT Right 1998   hip  . NM MYOVIEW LTD  03/2012   Normal  . SKIN GRAFT     to nose skin cancer  . TOTAL HIP ARTHROPLASTY    . TOTAL KNEE ARTHROPLASTY Left 10/09/2013   Procedure: TOTAL KNEE ARTHROPLASTY;  Surgeon: RLorn Junes MD;  Location: [facility];  Service: Orthopedics;  Laterality: Left;   Family History  Problem Relation Age of Onset  . Heart disease Father   . Cancer Sister        breast  . Diabetes Sister   . Heart disease Brother   . Heart disease Brother   . Heart disease Brother   . Diabetes Sister    Social History   Socioeconomic History  . Marital status: Widowed    Spouse name: Not on file  . Number of children: Not on file  . Years of education: Not on file  . Highest education level: Not on file  Social Needs  . Financial resource strain: Not hard at all  . Food insecurity - worry: Never true  . Food insecurity - inability: Never true  . Transportation needs - medical: No  . Transportation needs -  non-medical: No  Occupational History  . Not on file  Tobacco Use  . Smoking status: Never Smoker  . Smokeless tobacco: Never Used  Substance and Sexual Activity  . Alcohol use: No  . Drug use: No  . Sexual activity: No  Other Topics Concern  . Not on file  Social History Narrative  . Not on file    Outpatient Encounter Medications as of 07/02/2017  Medication Sig  . albuterol (PROVENTIL HFA;VENTOLIN HFA) 108 (90 Base) MCG/ACT inhaler Inhale 1-2 puffs into the lungs every 4 (four) hours as needed for wheezing or shortness of breath.  Marland Kitchen alendronate (FOSAMAX) 70 MG tablet Take 70 mg by mouth once a week. Take with a full glass of water on an empty stomach.  Marland Kitchen amLODipine (NORVASC) 5 MG tablet TAKE 1 TABLET(5 MG) BY MOUTH DAILY  . aspirin 81 MG tablet Take 81 mg by mouth daily.  . Blood Glucose Monitoring Suppl (ONE TOUCH ULTRA MINI) W/DEVICE KIT Use to test blood sugar twice daily ICD 10 E11 21  . Calcium Carbonate-Vitamin D (CALCIUM 600-D PO) Take 1 tablet by mouth 2 (two) times daily.   . carvedilol (COREG) 25 MG tablet TAKE 1 TABLET(25 MG) BY MOUTH TWICE DAILY WITH A MEAL  . colchicine (COLCRYS) 0.6 MG tablet Take 0.6 mg by mouth every other day.   . furosemide (LASIX) 20 MG tablet TAKE 1 TABLET(20 MG) BY MOUTH DAILY  . isosorbide mononitrate (IMDUR) 30 MG 24 hr tablet Take 1 tablet (30 mg total) by mouth daily.  Marland Kitchen levothyroxine (SYNTHROID, LEVOTHROID) 25 MCG tablet TAKE 1 TABLET(25 MCG) BY MOUTH DAILY BEFORE BREAKFAST  . losartan (COZAAR) 100 MG tablet Take 1 tablet (100 mg total) by mouth daily.  . metFORMIN (GLUCOPHAGE) 500 MG tablet TAKE 1/2 TABLET BY MOUTH DAILY WITH LARGEST MEAL  . Multiple Vitamin (MULITIVITAMIN WITH MINERALS) TABS Take 1 tablet by mouth daily.  . ONE TOUCH ULTRA TEST test strip USE TO TEST BLOOD SUGAR TWICE DAILY  . ONETOUCH DELICA LANCETS 38L MISC TEST BLOOD SUGAR TWICE DAILY.  Marland Kitchen predniSONE (DELTASONE) 5 MG tablet Take 5 mg by mouth daily.  . simvastatin  (ZOCOR) 40 MG tablet   . vitamin B-12 (CYANOCOBALAMIN) 1000 MCG tablet Take 1,000 mcg by mouth daily.     No facility-administered encounter medications on file as of 07/02/2017.     Activities of Daily Living In your present state of health, do you have any difficulty performing the following activities: 07/02/2017  Hearing? N  Vision? N  Difficulty concentrating or making decisions? Y  Walking or climbing stairs? N  Dressing or  bathing? N  Doing errands, shopping? Y  Preparing Food and eating ? Y  Comment has meals on wheels  Using the Toilet? N  In the past six months, have you accidently leaked urine? N  Do you have problems with loss of bowel control? N  Managing your Medications? N  Managing your Finances? Y  Housekeeping or managing your Housekeeping? Y  Some recent data might be hidden    Patient Care Team: Binnie Rail, MD as PCP - General (Internal Medicine) Garald Balding, MD as Consulting Physician (Orthopedic Surgery) Noe Gens as Physician Assistant (Orthopedic Surgery)    Assessment:   This is a routine wellness examination for Sheila Morgan. Physical assessment deferred to PCP.   Exercise Activities and Dietary recommendations Current Exercise Habits: The patient does not participate in regular exercise at present(chair exercise pamphlets provided), Exercise limited by: orthopedic condition(s)  Diet (meal preparation, eat out, water intake, caffeinated beverages, dairy products, fruits and vegetables): in general, a "healthy" diet  , patient states she receives Meals on Wheels.  Discussed drinking nutritional supplements, Glucerna supplements and coupons provided. Reviewed heart healthy and diabetic diet, encouraged patient to increase daily water intake by setting 2-3 bottles of water by her TV chair to help remind her to drink daily.  Goals    . Patient Stated     Set out 2-3 bottles of water daily, put it by my TV chair to remind me to drink.  Stay as healthy and as independent as possible       Fall Risk Fall Risk  07/02/2017 01/19/2017 12/23/2015 07/12/2015 03/13/2015  Falls in the past year? Yes Yes No No No  Number falls in past yr: 1 2 or more - - -  Injury with Fall? Yes Yes - - -  Comment - - - - -  Risk Factor Category  - High Fall Risk - - -  Risk for fall due to : Impaired balance/gait;Impaired mobility History of fall(s);Impaired balance/gait;Impaired mobility - - -  Follow up Falls prevention discussed;Education provided - - - -    Depression Screen PHQ 2/9 Scores 07/02/2017 01/19/2017 12/23/2015 03/13/2015  PHQ - 2 Score 0 0 0 0  PHQ- 9 Score 5 - - -     Cognitive Function MMSE - Mini Mental State Exam 07/02/2017  Orientation to time 4  Orientation to Place 5  Registration 3  Attention/ Calculation 3  Recall 1  Language- name 2 objects 2  Language- repeat 1  Language- follow 3 step command 3  Language- read & follow direction 1  Write a sentence 1  Copy design 1  Total score 25        Immunization History  Administered Date(s) Administered  . Influenza Whole 03/08/2007, 02/13/2008, 02/15/2009, 12/31/2009  . Influenza, High Dose Seasonal PF 02/12/2014  . Influenza,inj,Quad PF,6+ Mos 03/13/2015, 12/23/2015  . Influenza-Unspecified 02/04/2013, 02/17/2017  . Pneumococcal Conjugate-13 06/13/2015  . Pneumococcal Polysaccharide-23 03/29/2013  . Td 01/07/2010   Screening Tests Health Maintenance  Topic Date Due  . HEMOGLOBIN A1C  06/24/2017  . FOOT EXAM  01/22/2018  . OPHTHALMOLOGY EXAM  06/01/2018  . TETANUS/TDAP  01/08/2020  . INFLUENZA VACCINE  Completed  . DEXA SCAN  Completed  . PNA vac Low Risk Adult  Completed      Plan:    Start doing brain stimulating activities (puzzles, reading, adult coloring books, staying active) to keep memory sharp.   Continue to eat heart healthy diet (full  of fruits, vegetables, whole grains, lean protein, water--limit salt, fat, and sugar intake) and increase  physical activity as tolerated.  I have personally reviewed and noted the following in the patient's chart:   . Medical and social history . Use of alcohol, tobacco or illicit drugs  . Current medications and supplements . Functional ability and status . Nutritional status . Physical activity . Advanced directives . List of other physicians . Vitals . Screenings to include cognitive, depression, and falls . Referrals and appointments  In addition, I have reviewed and discussed with patient certain preventive protocols, quality metrics, and best practice recommendations. A written personalized care plan for preventive services as well as general preventive health recommendations were provided to patient.     Michiel Cowboy, RN  07/02/2017   Medical screening examination/treatment/procedure(s) were performed by non-physician practitioner and as supervising physician I was immediately available for consultation/collaboration. I agree with above. Binnie Rail, MD

## 2017-07-03 ENCOUNTER — Other Ambulatory Visit: Payer: Self-pay | Admitting: Internal Medicine

## 2017-07-04 ENCOUNTER — Telehealth: Payer: Self-pay | Admitting: Internal Medicine

## 2017-07-04 NOTE — Telephone Encounter (Signed)
Call her regarding her blood work --  Her sugars are well controlled.  Her liver tests are normal.    Her thyroid dose needs to be increased to 50 mcg daily - please send new script.    Her kidney function is slightly decreased and her calcium is slightly elevated - we need to recheck this with some additional blood work - she can come to the lab anytime.

## 2017-07-05 MED ORDER — LEVOTHYROXINE SODIUM 50 MCG PO TABS: 50.0000 ug | ORAL_TABLET | Freq: Every day | ORAL | 1 refills | Status: DC

## 2017-07-05 NOTE — Telephone Encounter (Signed)
LVm informing pt to call back for results. Also contacted Daughter and LVM with results. Medication sent to [facility]

## 2017-07-08 NOTE — Telephone Encounter (Signed)
Spoke with pt to inform.  

## 2017-07-16 ENCOUNTER — Other Ambulatory Visit (INDEPENDENT_AMBULATORY_CARE_PROVIDER_SITE_OTHER): Payer: Medicare Other

## 2017-07-16 LAB — COMPREHENSIVE METABOLIC PANEL
ALT: 15 U/L (ref 0–35)
AST: 15 U/L (ref 0–37)
Albumin: 4 g/dL (ref 3.5–5.2)
Alkaline Phosphatase: 71 U/L (ref 39–117)
BUN: 13 mg/dL (ref 6–23)
CHLORIDE: 100 meq/L (ref 96–112)
CO2: 34 mEq/L — ABNORMAL HIGH (ref 19–32)
Calcium: 10.5 mg/dL (ref 8.4–10.5)
Creatinine, Ser: 1.24 mg/dL — ABNORMAL HIGH (ref 0.40–1.20)
GFR: 53.15 mL/min — AB (ref >60.00)
GLUCOSE: 119 mg/dL — AB (ref 70–99)
POTASSIUM: 3.5 meq/L (ref 3.5–5.1)
SODIUM: 143 meq/L (ref 135–145)
Total Bilirubin: 0.6 mg/dL (ref 0.2–1.2)
Total Protein: 6.9 g/dL (ref 6.0–8.3)

## 2017-07-16 LAB — VITAMIN D 25 HYDROXY (VIT D DEFICIENCY, FRACTURES): VITD: 81.52 ng/mL (ref 30.00–100.00)

## 2017-07-19 DIAGNOSIS — H349 Unspecified retinal vascular occlusion: Secondary | ICD-10-CM | POA: Diagnosis not present

## 2017-07-20 LAB — PTH, INTACT AND CALCIUM
Calcium: 10.6 mg/dL — ABNORMAL HIGH (ref 8.6–10.4)
PTH: 43 pg/mL (ref 14–64)

## 2017-07-20 LAB — PROTEIN ELECTROPHORESIS, SERUM
ALPHA 2: 0.7 g/dL (ref 0.5–0.9)
Albumin ELP: 4 g/dL (ref 3.8–4.8)
Alpha 1: 0.3 g/dL (ref 0.2–0.3)
BETA GLOBULIN: 0.5 g/dL (ref 0.4–0.6)
Beta 2: 0.4 g/dL (ref 0.2–0.5)
Gamma Globulin: 0.8 g/dL (ref 0.8–1.7)
Total Protein: 6.7 g/dL (ref 6.1–8.1)

## 2017-07-27 ENCOUNTER — Telehealth: Payer: Self-pay | Admitting: Internal Medicine

## 2017-07-27 NOTE — Telephone Encounter (Signed)
Copied from [place] [id number]. Topic: Quick Communication - Rx Refill/Question >> Jul 27, 2017  4:19 PM Cleaster Corin, [place] wrote: Medication: ONETOUCH DELICA LANCETS 49T [place] [[id number] Has the patient contacted their pharmacy? yes (Agent: If no, request that the patient contact the pharmacy for the refill.) Preferred Pharmacy (with phone number or street name): [facility] [phone], [place] [address] Phone: [phone] Fax: [phone]   Agent: Please be advised that RX refills may take up to 3 business days. We ask that you follow-up with your pharmacy.

## 2017-07-28 ENCOUNTER — Other Ambulatory Visit: Payer: Self-pay | Admitting: *Deleted

## 2017-07-28 ENCOUNTER — Other Ambulatory Visit: Payer: Self-pay | Admitting: Emergency Medicine

## 2017-07-28 MED ORDER — ONETOUCH DELICA LANCETS 33G MISC: 1 refills | Status: DC

## 2017-07-28 MED ORDER — GLUCOSE BLOOD VI STRP: ORAL_STRIP | 12 refills | Status: DC

## 2017-07-28 NOTE — Progress Notes (Signed)
Refill for onetouch delica lancets. lov 07/02/17 Nov 01/05/18 Provider: Dr.S. Quay Burow

## 2017-09-16 DIAGNOSIS — Z79899 Other long term (current) drug therapy: Secondary | ICD-10-CM | POA: Diagnosis not present

## 2017-09-16 DIAGNOSIS — M1189 Other specified crystal arthropathies, multiple sites: Secondary | ICD-10-CM | POA: Diagnosis not present

## 2017-09-16 DIAGNOSIS — M170 Bilateral primary osteoarthritis of knee: Secondary | ICD-10-CM | POA: Diagnosis not present

## 2017-09-16 DIAGNOSIS — M25561 Pain in right knee: Secondary | ICD-10-CM | POA: Diagnosis not present

## 2017-09-16 DIAGNOSIS — M150 Primary generalized (osteo)arthritis: Secondary | ICD-10-CM | POA: Diagnosis not present

## 2017-09-16 DIAGNOSIS — M810 Age-related osteoporosis without current pathological fracture: Secondary | ICD-10-CM | POA: Diagnosis not present

## 2017-09-21 DIAGNOSIS — H349 Unspecified retinal vascular occlusion: Secondary | ICD-10-CM | POA: Diagnosis not present

## 2017-10-24 ENCOUNTER — Other Ambulatory Visit: Payer: Self-pay | Admitting: Internal Medicine

## 2017-11-04 ENCOUNTER — Ambulatory Visit (HOSPITAL_COMMUNITY)
Admission: EM | Admit: 2017-11-04 | Discharge: 2017-11-04 | Disposition: A | Discharge status: Home / Self Care | Payer: Medicare Other | Attending: Family Medicine | Admitting: Family Medicine

## 2017-11-04 ENCOUNTER — Encounter (HOSPITAL_COMMUNITY): Payer: Self-pay | Admitting: *Deleted

## 2017-11-04 DIAGNOSIS — E785 Hyperlipidemia, unspecified: Secondary | ICD-10-CM | POA: Diagnosis not present

## 2017-11-04 DIAGNOSIS — Z96649 Presence of unspecified artificial hip joint: Secondary | ICD-10-CM | POA: Diagnosis not present

## 2017-11-04 DIAGNOSIS — Z79899 Other long term (current) drug therapy: Secondary | ICD-10-CM | POA: Insufficient documentation

## 2017-11-04 DIAGNOSIS — Z951 Presence of aortocoronary bypass graft: Secondary | ICD-10-CM | POA: Insufficient documentation

## 2017-11-04 DIAGNOSIS — Z955 Presence of coronary angioplasty implant and graft: Secondary | ICD-10-CM | POA: Insufficient documentation

## 2017-11-04 DIAGNOSIS — M545 Low back pain, unspecified: Secondary | ICD-10-CM

## 2017-11-04 DIAGNOSIS — I2510 Atherosclerotic heart disease of native coronary artery without angina pectoris: Secondary | ICD-10-CM | POA: Diagnosis not present

## 2017-11-04 DIAGNOSIS — I10 Essential (primary) hypertension: Secondary | ICD-10-CM | POA: Insufficient documentation

## 2017-11-04 DIAGNOSIS — Z9889 Other specified postprocedural states: Secondary | ICD-10-CM | POA: Insufficient documentation

## 2017-11-04 DIAGNOSIS — Z7982 Long term (current) use of aspirin: Secondary | ICD-10-CM | POA: Diagnosis not present

## 2017-11-04 DIAGNOSIS — M109 Gout, unspecified: Secondary | ICD-10-CM | POA: Insufficient documentation

## 2017-11-04 DIAGNOSIS — M353 Polymyalgia rheumatica: Secondary | ICD-10-CM | POA: Insufficient documentation

## 2017-11-04 DIAGNOSIS — M5416 Radiculopathy, lumbar region: Secondary | ICD-10-CM | POA: Diagnosis not present

## 2017-11-04 DIAGNOSIS — Z7952 Long term (current) use of systemic steroids: Secondary | ICD-10-CM | POA: Insufficient documentation

## 2017-11-04 DIAGNOSIS — E119 Type 2 diabetes mellitus without complications: Secondary | ICD-10-CM | POA: Diagnosis not present

## 2017-11-04 DIAGNOSIS — Z79890 Hormone replacement therapy (postmenopausal): Secondary | ICD-10-CM | POA: Insufficient documentation

## 2017-11-04 DIAGNOSIS — I252 Old myocardial infarction: Secondary | ICD-10-CM | POA: Diagnosis not present

## 2017-11-04 DIAGNOSIS — M810 Age-related osteoporosis without current pathological fracture: Secondary | ICD-10-CM | POA: Insufficient documentation

## 2017-11-04 DIAGNOSIS — Z96652 Presence of left artificial knee joint: Secondary | ICD-10-CM | POA: Insufficient documentation

## 2017-11-04 DIAGNOSIS — Z7984 Long term (current) use of oral hypoglycemic drugs: Secondary | ICD-10-CM | POA: Diagnosis not present

## 2017-11-04 DIAGNOSIS — M539 Dorsopathy, unspecified: Secondary | ICD-10-CM

## 2017-11-04 DIAGNOSIS — R82998 Other abnormal findings in urine: Secondary | ICD-10-CM

## 2017-11-04 LAB — POCT URINALYSIS DIP (DEVICE)
BILIRUBIN URINE: NEGATIVE
Glucose, UA: NEGATIVE mg/dL
HGB URINE DIPSTICK: NEGATIVE
Nitrite: NEGATIVE
Protein, ur: 100 mg/dL — AB
SPECIFIC GRAVITY, URINE: 1.015 (ref 1.005–1.030)
Urobilinogen, UA: 0.2 mg/dL (ref 0.0–1.0)
pH: 6 (ref 5.0–8.0)

## 2017-11-04 MED ORDER — PREDNISONE 10 MG PO TABS: 40.0000 mg | ORAL_TABLET | Freq: Every day | ORAL | 0 refills | Status: DC

## 2017-11-04 NOTE — ED Notes (Signed)
Assisted to bathroom for extensive attempt to get a urine specimen.  optained specimen and placed in lab

## 2017-11-04 NOTE — Discharge Instructions (Signed)
Your back pain is arthritis type pain with the prednisone course.  The urine sample shows that you are not hydrating well or could have a urinary tract infection or both.  I ordered a urine culture which we will look to see if you have bacterial growth in the urinary tract system.  If it turns out that there is an infection, our lab will call you and let you know and we will send a prescription electronically for an antibiotic course.  In the meantime, I need to make sure that you are actually hydrating very well with 1-1/2 to 2 L of water daily.  Come back to the clinic or go to your primary care doctor if you develop fever, nausea, vomiting, belly pain, painful urination, confusion.

## 2017-11-04 NOTE — ED Notes (Signed)
Provided a cup of water.

## 2017-11-04 NOTE — ED Provider Notes (Signed)
MRN: [id number] DOB: [birth year]  Subjective:   Sheila Morgan is a 84 y.o. female presenting for 3 day history of left low back pain. Pain is sharp, intermittent, positional, non-radiating. Denies fever, dysuria, hematuria, urinary frequency, falls, trauma, lifting, strenuous activity.  Patient has history of osteoporosis, arthritis of the neck and thoracic spine, polymyalgia rheumatica.  Also has a history of lumbar radiculopathy.  She is on a daily dose of prednisone 5 mg but is unclear why.  Denies smoking cigarettes.  She has a history of diabetes and is well controlled.  No current facility-administered medications for this encounter.   Current Outpatient Medications:  .  alendronate (FOSAMAX) 70 MG tablet, Take 70 mg by mouth once a week. Take with a full glass of water on an empty stomach., Disp: , Rfl:  .  amLODipine (NORVASC) 5 MG tablet, TAKE 1 TABLET(5 MG) BY MOUTH DAILY, Disp: 90 tablet, Rfl: 1 .  aspirin 81 MG tablet, Take 81 mg by mouth daily., Disp: , Rfl:  .  Calcium Carbonate-Vitamin D (CALCIUM 600-D PO), Take 1 tablet by mouth 2 (two) times daily. , Disp: , Rfl:  .  colchicine (COLCRYS) 0.6 MG tablet, Take 0.6 mg by mouth every other day. , Disp: , Rfl:  .  diclofenac sodium (VOLTAREN) 1 % GEL, Apply topically 4 (four) times daily., Disp: , Rfl:  .  furosemide (LASIX) 20 MG tablet, TAKE 1 TABLET(20 MG) BY MOUTH DAILY, Disp: 90 tablet, Rfl: 1 .  isosorbide mononitrate (IMDUR) 30 MG 24 hr tablet, Take 1 tablet (30 mg total) by mouth daily., Disp: 90 tablet, Rfl: 3 .  levothyroxine (SYNTHROID, LEVOTHROID) 50 MCG tablet, Take 1 tablet (50 mcg total) by mouth daily., Disp: 90 tablet, Rfl: 1 .  losartan (COZAAR) 100 MG tablet, Take 1 tablet (100 mg total) by mouth daily., Disp: 90 tablet, Rfl: 3 .  metFORMIN (GLUCOPHAGE) 500 MG tablet, TAKE 1/2 TABLET BY MOUTH DAILY WITH LARGEST MEAL, Disp: 45 tablet, Rfl: 1 .  predniSONE (DELTASONE) 5 MG tablet, Take 5 mg by mouth every other day. ,  Disp: , Rfl:  .  simvastatin (ZOCOR) 40 MG tablet, , Disp: , Rfl: 1 .  vitamin B-12 (CYANOCOBALAMIN) 1000 MCG tablet, Take 1,000 mcg by mouth daily.  , Disp: , Rfl:  .  albuterol (PROVENTIL HFA;VENTOLIN HFA) 108 (90 Base) MCG/ACT inhaler, Inhale 1-2 puffs into the lungs every 4 (four) hours as needed for wheezing or shortness of breath., Disp: 1 Inhaler, Rfl: 0 .  Blood Glucose Monitoring Suppl (ONE TOUCH ULTRA MINI) W/DEVICE KIT, Use to test blood sugar twice daily ICD 10 E11 21, Disp: 1 each, Rfl: 0 .  carvedilol (COREG) 25 MG tablet, TAKE 1 TABLET(25 MG) BY MOUTH TWICE DAILY WITH A MEAL, Disp: 180 tablet, Rfl: 1 .  glucose blood (ONE TOUCH ULTRA TEST) test strip, Use to test blood sugar twice daily.. DX E11.51, Disp: 200 each, Rfl: 12 .  ONETOUCH DELICA LANCETS 95A MISC, TEST BLOOD SUGAR TWICE DAILY., Disp: 200 each, Rfl: 1   No Known Allergies  Past Medical History:  Diagnosis Date  . Arthritis   . Cancer ([place])    skin on nose .  mylenoma  . Coronary artery disease    status post post LAD angioplasty in 1993  and subsequent coronary artery bypass grafting x1 with a LIMA to the LAD July of 1994. She had stenting of her RCA with a bare-metal stent November 2002.  . Diabetes mellitus  2  . Gout   . Heart murmur   . Hematoma    Right groin  . Hemorrhoids   . Hiatal hernia   . History of blood transfusion   . HOH (hard of hearing)   . Hyperlipidemia   . Hypertension   . Left knee DJD   . Lightheadedness    has seen Dr Gwenlyn Found and ENT.. cant find out why  . Myocardial infarction ([place])   . Osteoporosis   . PONV (postoperative nausea and vomiting)   . Shingles 10/27/2013     Past Surgical History:  Procedure Laterality Date  . CORONARY ANGIOPLASTY  1993   LAD  . CORONARY ANGIOPLASTY WITH STENT PLACEMENT  03/09/01   RCA  . CORONARY ARTERY BYPASS GRAFT  1994  . JOINT REPLACEMENT Right 1998   hip  . NM MYOVIEW LTD  03/2012   Normal  . SKIN GRAFT     to nose skin cancer  .  TOTAL HIP ARTHROPLASTY    . TOTAL KNEE ARTHROPLASTY Left 10/09/2013   Procedure: TOTAL KNEE ARTHROPLASTY;  Surgeon: Lorn Junes, MD;  Location: [facility];  Service: Orthopedics;  Laterality: Left;    Objective:   Vitals: BP (!) 151/57   Pulse 78   Temp 97.8 F (36.6 C) (Oral)   Resp 16   SpO2 98%   Physical Exam  Constitutional: She is oriented to person, place, and time. She appears well-developed and well-nourished.  Cardiovascular: Normal rate.  Pulmonary/Chest: Effort normal.  Musculoskeletal:       Lumbar back: She exhibits decreased range of motion (which is normal for her), tenderness (over area depicted) and bony tenderness (over upper posterior left hip area). She exhibits no swelling, no edema, no deformity and no laceration.       Back:  Neurological: She is alert and oriented to person, place, and time.  Skin: Skin is warm and dry.  Psychiatric: She has a normal mood and affect.   Results for orders placed or performed during the hospital encounter of 11/04/17 (from the past 24 hour(s))  POCT urinalysis dip (device)     Status: Abnormal   Collection Time: 11/04/17 12:21 PM  Result Value Ref Range   Glucose, UA NEGATIVE NEGATIVE mg/dL   Bilirubin Urine NEGATIVE NEGATIVE   Ketones, ur TRACE (A) NEGATIVE mg/dL   Specific Gravity, Urine 1.015 1.005 - 1.030   Hgb urine dipstick NEGATIVE NEGATIVE   pH 6.0 5.0 - 8.0   Protein, ur 100 (A) NEGATIVE mg/dL   Urobilinogen, UA 0.2 0.0 - 1.0 mg/dL   Nitrite NEGATIVE NEGATIVE   Leukocytes, UA LARGE (A) NEGATIVE   Assessment and Plan :   Acute left-sided low back pain without sciatica  Osteoporosis, unspecified osteoporosis type, unspecified pathological fracture presence  Multilevel degenerative disc disease  Leukocytes in urine  We will manage her pain is arthritic type pain with a short steroid course.  Counseled on adequate hydration, urine culture pending.  Return to clinic precautions reviewed.   Jaynee Eagles,  PA-C 11/04/17 1231

## 2017-11-04 NOTE — ED Triage Notes (Signed)
Per family & pt: started with left low back pain x 3 days.  Yesterday felt better after resting.  Pain became worse again throughout last night.  Denies any urinary sxs.  C/O pain worse with movement.

## 2017-11-04 NOTE — ED Notes (Signed)
Pt assisted to restroom.  Unable to provide urine sample at this time.

## 2017-11-04 NOTE — ED Notes (Signed)
Patient is unable to void at this time 

## 2017-11-05 LAB — URINE CULTURE

## 2017-11-24 ENCOUNTER — Other Ambulatory Visit: Payer: Self-pay | Admitting: Internal Medicine

## 2017-11-30 DIAGNOSIS — H349 Unspecified retinal vascular occlusion: Secondary | ICD-10-CM | POA: Diagnosis not present

## 2017-12-06 ENCOUNTER — Other Ambulatory Visit: Payer: Self-pay | Admitting: Internal Medicine

## 2017-12-10 ENCOUNTER — Encounter: Payer: Self-pay | Admitting: Podiatry

## 2017-12-10 ENCOUNTER — Ambulatory Visit (INDEPENDENT_AMBULATORY_CARE_PROVIDER_SITE_OTHER): Payer: Medicare Other | Admitting: Podiatry

## 2017-12-10 DIAGNOSIS — B351 Tinea unguium: Secondary | ICD-10-CM | POA: Diagnosis not present

## 2017-12-10 DIAGNOSIS — I2583 Coronary atherosclerosis due to lipid rich plaque: Secondary | ICD-10-CM

## 2017-12-10 DIAGNOSIS — E1151 Type 2 diabetes mellitus with diabetic peripheral angiopathy without gangrene: Secondary | ICD-10-CM | POA: Diagnosis not present

## 2017-12-10 DIAGNOSIS — L84 Corns and callosities: Secondary | ICD-10-CM | POA: Diagnosis not present

## 2017-12-10 DIAGNOSIS — I2510 Atherosclerotic heart disease of native coronary artery without angina pectoris: Secondary | ICD-10-CM

## 2017-12-13 ENCOUNTER — Telehealth: Payer: Self-pay | Admitting: Internal Medicine

## 2017-12-13 MED ORDER — COLCHICINE 0.6 MG PO TABS: 0.6000 mg | ORAL_TABLET | ORAL | 0 refills | Status: DC

## 2017-12-13 NOTE — Telephone Encounter (Signed)
Copied from [place] [id number]. Topic: Quick Communication - Rx Refill/Question >> Dec 13, 2017 10:38 AM Gardiner Ramus wrote: Medication: colchicine (COLCRYS) 0.6 MG tablet [[id number]]   Has the patient contacted their pharmacy? yes Preferred Pharmacy (with phone number or street name): [id number], [place] [phone] (Phone) [phone] (Fax)    Agent: Please be advised that RX refills may take up to 3 business days. We ask that you follow-up with your pharmacy.

## 2017-12-13 NOTE — Progress Notes (Signed)
Subjective:Subjective: Sheila Morgan is a 84 y.o. y.o. female who presents today with NIDDM with PVD and cc of painful, discolored, thick toenails and painful calluses which interfere with daily activities. Pain is aggravated when wearing enclosed shoe gear. Pain is relieved with periodic professional debridement.   Ms. Britz voices no new problems on today's visit.  Objective: Vascular Examination: Capillary refill time immediate x 10 digits Dorsalis pedis pulses present b/l Posterior tibial pulses absent b/l No digital hair x 10 digits Skin temperature gradient WNL b/l  Dermatological Examination: Skin with normal turgor, texture and tone b/l Hyperkeratotic lesion submet head 5 b/l Toenails 1-5 b/l discolored, thick, dystrophic with subungual debris and pain with palpation to nailbeds due to thickness of nails.  Musculoskeletal: Muscle strength 5/5 to all LE muscle groups  Neurological: Sensation diminished with 10 gram monofilament. Vibratory sensation diminished  Assessment: 1. Painful onychomycosis toenails 1-5 b/l 2. Calluses submet head 5 b/l 3. NIDDM with Peripheral arterial disease  Plan: 1. Discussed diabetic foot care principles. Literature dispensed. 2. Discussed topical treatment for onychomycosis: She is interested in [employer] Nail Lacquer. Prescription sent to [facility]. She is to apply one coat to each toenail daily. 3. Toenails 1-5 b/l were debrided in length and girth without iatrogenic bleeding. 4. Hyperkeratotic lesion(s)  pared with sterile chisel blade submet head 5 b/l and both gently smoothed with burr. 5. Patient to continue soft, supportive shoe gear 6. Patient to report any pedal injuries to medical professional  7. Follow up 3 months.  8. Patient/POA to call should there be a concern in the interim.

## 2018-01-03 NOTE — Progress Notes (Signed)
Subjective:    Patient ID: Sheila Morgan, female    DOB: May 30, 1932, 84 y.o.   MRN: [id number]  HPI The patient is here for follow up.  Her daughter is here with her.  Diabetes: She is taking her medication daily as prescribed. She is fairly compliant with a diabetic diet. She is not exercising regularly.  She is up-to-date with an ophthalmology examination.   Hypothyroidism:  She is taking her medication daily.  She denies any recent changes in energy or weight that are unexplained.   CAD, Hypertension: She is taking her medication daily. She is compliant with a low sodium diet.  She denies chest pain, palpitations, edema, shortness of breath and regular headaches. She is not exercising regularly.  She does monitor her blood pressure at home, ranges from 034-742V systolically.    Hyperlipidemia: She is taking her medication daily. She is compliant with a low fat/cholesterol diet. She is not exercising regularly. She denies myalgias.   Increased confusion:  Especially in the past two months her daughter has noticed some increased confusion and she was concerned about her memory.  She has woken up at 6 pm thinking it was 6 am.  She has told her daughter about conversations that they never had.  Her duaghter does not think it coorelates with any medication changes.  She is not overly concerned, but has noticed a difference.  Hearing loss: She has been evaluated in the past and has been told that she should have hearing aids but she does not want them.  Her daughter feels this has gotten worse.  Medications and allergies reviewed with patient and updated if appropriate.  Patient Active Problem List   Diagnosis Date Noted  . Multiple closed fractures of ribs of right side 01/19/2017  . Acute pain of right shoulder 01/05/2017  . Rib pain on right side 01/05/2017  . Acute pain of right knee 01/05/2017  . B12 deficiency 06/13/2015  . Hypothyroidism 06/13/2015  . DM (diabetes mellitus)  type II, controlled, with peripheral vascular disorder ([place]) 03/06/2014  . Gout 10/15/2013  . Arthritis   . Hiatal hernia   . Coronary artery disease   . Left knee DJD   . Benign paroxysmal positional vertigo 05/19/2013  . Weakness generalized 05/09/2013  . Pseudogout of left knee 03/29/2013  . Acute right lumbar radiculopathy 03/18/2013  . Shoulder impingement syndrome 01/25/2013  . Vertigo 10/17/2012  . Renal insufficiency, mild 06/20/2011  . Essential hypertension 12/31/2009  . Osteoporosis 12/31/2009  . SKIN CANCER, HX OF 12/31/2009  . Proteinuria 01/05/2008  . UNSPECIFIED ANEMIA 12/21/2007  . Hyperlipidemia 09/08/2006  . Polymyalgia rheumatica ([place]) 09/08/2006  . CORONARY ARTERY BYPASS GRAFT, HX OF 09/08/2006    Current Outpatient Medications on File Prior to Visit  Medication Sig Dispense Refill  . alendronate (FOSAMAX) 70 MG tablet Take 70 mg by mouth once a week. Take with a full glass of water on an empty stomach.    Marland Kitchen amLODipine (NORVASC) 5 MG tablet TAKE 1 TABLET(5 MG) BY MOUTH DAILY 90 tablet 1  . aspirin 81 MG tablet Take 81 mg by mouth daily.    . Blood Glucose Monitoring Suppl (ONE TOUCH ULTRA MINI) W/DEVICE KIT Use to test blood sugar twice daily ICD 10 E11 21 1 each 0  . Calcium Carbonate-Vitamin D (CALCIUM 600-D PO) Take 1 tablet by mouth 2 (two) times daily.     . carvedilol (COREG) 25 MG tablet TAKE 1 TABLET(25 MG) BY MOUTH  TWICE DAILY WITH A MEAL 180 tablet 1  . colchicine (COLCRYS) 0.6 MG tablet Take 1 tablet (0.6 mg total) by mouth every other day. 15 tablet 0  . diclofenac sodium (VOLTAREN) 1 % GEL Apply topically 4 (four) times daily.    . furosemide (LASIX) 20 MG tablet TAKE 1 TABLET(20 MG) BY MOUTH DAILY 90 tablet 1  . glucose blood (ONE TOUCH ULTRA TEST) test strip Use to test blood sugar twice daily.. DX E11.51 200 each 12  . isosorbide mononitrate (IMDUR) 30 MG 24 hr tablet Take 1 tablet (30 mg total) by mouth daily. 90 tablet 3  . levothyroxine  (SYNTHROID, LEVOTHROID) 50 MCG tablet Take 1 tablet (50 mcg total) by mouth daily. 90 tablet 1  . losartan (COZAAR) 100 MG tablet Take 1 tablet (100 mg total) by mouth daily. 90 tablet 3  . metFORMIN (GLUCOPHAGE) 500 MG tablet TAKE 1/2 TABLET BY MOUTH DAILY WITH LARGEST MEAL 45 tablet 0  . NON FORMULARY [facility]  Onychomycosis Nail Lacquer -  Fluconazole 2%, Terbinafine 1% DMSO Apply to affected nail once daily Qty. 120 gm 3 refills    . ONETOUCH DELICA LANCETS 08Q MISC TEST BLOOD SUGAR TWICE DAILY. 200 each 1  . predniSONE (DELTASONE) 10 MG tablet Take 4 tablets (40 mg total) by mouth daily with breakfast. 20 tablet 0  . predniSONE (DELTASONE) 5 MG tablet Take 5 mg by mouth every other day.     . simvastatin (ZOCOR) 40 MG tablet   1  . vitamin B-12 (CYANOCOBALAMIN) 1000 MCG tablet Take 1,000 mcg by mouth daily.       No current facility-administered medications on file prior to visit.     Past Medical History:  Diagnosis Date  . Arthritis   . Cancer ([place])    skin on nose .  mylenoma  . Coronary artery disease    status post post LAD angioplasty in 1993  and subsequent coronary artery bypass grafting x1 with a LIMA to the LAD July of 1994. She had stenting of her RCA with a bare-metal stent November 2002.  . Diabetes mellitus    2  . Gout   . Heart murmur   . Hematoma    Right groin  . Hemorrhoids   . Hiatal hernia   . History of blood transfusion   . HOH (hard of hearing)   . Hyperlipidemia   . Hypertension   . Left knee DJD   . Lightheadedness    has seen Dr Gwenlyn Found and ENT.. cant find out why  . Myocardial infarction ([place])   . Osteoporosis   . PONV (postoperative nausea and vomiting)   . Shingles 10/27/2013    Past Surgical History:  Procedure Laterality Date  . CORONARY ANGIOPLASTY  1993   LAD  . CORONARY ANGIOPLASTY WITH STENT PLACEMENT  03/09/01   RCA  . CORONARY ARTERY BYPASS GRAFT  1994  . JOINT REPLACEMENT Right 1998   hip  . NM MYOVIEW LTD   03/2012   Normal  . SKIN GRAFT     to nose skin cancer  . TOTAL HIP ARTHROPLASTY    . TOTAL KNEE ARTHROPLASTY Left 10/09/2013   Procedure: TOTAL KNEE ARTHROPLASTY;  Surgeon: Lorn Junes, MD;  Location: [facility];  Service: Orthopedics;  Laterality: Left;    Social History   Socioeconomic History  . Marital status: Widowed    Spouse name: Not on file  . Number of children: Not on file  . Years of  education: Not on file  . Highest education level: Not on file  Occupational History  . Not on file  Social Needs  . Financial resource strain: Not hard at all  . Food insecurity:    Worry: Never true    Inability: Never true  . Transportation needs:    Medical: No    Non-medical: No  Tobacco Use  . Smoking status: Never Smoker  . Smokeless tobacco: Never Used  Substance and Sexual Activity  . Alcohol use: No  . Drug use: No  . Sexual activity: Never  Lifestyle  . Physical activity:    Days per week: 0 days    Minutes per session: 0 min  . Stress: Not at all  Relationships  . Social connections:    Talks on phone: More than three times a week    Gets together: More than three times a week    Attends religious service: Not on file    Active member of club or organization: Not on file    Attends meetings of clubs or organizations: Not on file    Relationship status: Widowed  Other Topics Concern  . Not on file  Social History Narrative  . Not on file    Family History  Problem Relation Age of Onset  . Heart disease Father   . Cancer Sister        breast  . Diabetes Sister   . Heart disease Brother   . Heart disease Brother   . Heart disease Brother   . Diabetes Sister     Review of Systems  Constitutional: Negative for fever.  HENT: Positive for hearing loss.   Respiratory: Negative for cough, shortness of breath and wheezing.   Cardiovascular: Negative for chest pain, palpitations and leg swelling.  Neurological: Positive for light-headedness. Negative for  headaches.       Objective:   Vitals:   01/05/18 0836  BP: (!) 158/90  Pulse: 74  Resp: 14  Temp: 97.6 F (36.4 C)  SpO2: 98%   BP Readings from Last 3 Encounters:  01/05/18 (!) 158/90  11/04/17 (!) 151/57  07/02/17 (!) 174/92   Wt Readings from Last 3 Encounters:  01/05/18 154 lb (69.9 kg)  07/02/17 161 lb (73 kg)  07/02/17 161 lb (73 kg)   Body mass index is 24.12 kg/m.   Physical Exam    Constitutional: Appears well-developed and well-nourished. No distress.  HENT:  Head: Normocephalic and atraumatic.  Neck: Neck supple. No tracheal deviation present. No thyromegaly present.  No cervical lymphadenopathy Cardiovascular: Normal rate, regular rhythm and normal heart sounds.   2/6 systolic murmur heard. No carotid bruit .  No edema Pulmonary/Chest: Effort normal and breath sounds normal. No respiratory distress. No has no wheezes. No rales.  Skin: Skin is warm and dry. Not diaphoretic.  Psychiatric: Normal mood and affect. Behavior is normal.      Assessment & Plan:    See Problem List for Assessment and Plan of chronic medical problems.

## 2018-01-03 NOTE — Patient Instructions (Addendum)
  Test(s) ordered today. Your results will be released to [place] (or called to you) after review, usually within 72hours after test completion. If any changes need to be made, you will be notified at that same time.  Flu immunization administered today.     Medications reviewed and updated.  No changes recommended at this time.   A referral was ordered for ENT - Dr Wilburn Cornelia and Neurology - Dr Si Raider at [facility].  Please followup in 6 months

## 2018-01-05 ENCOUNTER — Other Ambulatory Visit (INDEPENDENT_AMBULATORY_CARE_PROVIDER_SITE_OTHER): Payer: Medicare Other

## 2018-01-05 ENCOUNTER — Encounter: Payer: Self-pay | Admitting: Psychology

## 2018-01-05 ENCOUNTER — Ambulatory Visit (INDEPENDENT_AMBULATORY_CARE_PROVIDER_SITE_OTHER): Payer: Medicare Other | Admitting: Internal Medicine

## 2018-01-05 ENCOUNTER — Encounter: Payer: Self-pay | Admitting: Internal Medicine

## 2018-01-05 VITALS — BP 158/90 | HR 74 | Temp 97.6°F | Resp 14 | Ht 67.0 in | Wt 154.0 lb

## 2018-01-05 DIAGNOSIS — I2583 Coronary atherosclerosis due to lipid rich plaque: Secondary | ICD-10-CM | POA: Diagnosis not present

## 2018-01-05 DIAGNOSIS — E038 Other specified hypothyroidism: Secondary | ICD-10-CM

## 2018-01-05 DIAGNOSIS — E1151 Type 2 diabetes mellitus with diabetic peripheral angiopathy without gangrene: Secondary | ICD-10-CM

## 2018-01-05 DIAGNOSIS — E7849 Other hyperlipidemia: Secondary | ICD-10-CM

## 2018-01-05 DIAGNOSIS — I2510 Atherosclerotic heart disease of native coronary artery without angina pectoris: Secondary | ICD-10-CM

## 2018-01-05 DIAGNOSIS — H9190 Unspecified hearing loss, unspecified ear: Secondary | ICD-10-CM | POA: Insufficient documentation

## 2018-01-05 DIAGNOSIS — R413 Other amnesia: Secondary | ICD-10-CM | POA: Diagnosis not present

## 2018-01-05 DIAGNOSIS — M353 Polymyalgia rheumatica: Secondary | ICD-10-CM | POA: Diagnosis not present

## 2018-01-05 DIAGNOSIS — Z23 Encounter for immunization: Secondary | ICD-10-CM | POA: Diagnosis not present

## 2018-01-05 DIAGNOSIS — I10 Essential (primary) hypertension: Secondary | ICD-10-CM

## 2018-01-05 DIAGNOSIS — H9193 Unspecified hearing loss, bilateral: Secondary | ICD-10-CM

## 2018-01-05 LAB — COMPREHENSIVE METABOLIC PANEL
ALBUMIN: 4.1 g/dL (ref 3.5–5.2)
ALK PHOS: 69 U/L (ref 39–117)
ALT: 13 U/L (ref 0–35)
AST: 16 U/L (ref 0–37)
BUN: 14 mg/dL (ref 6–23)
CALCIUM: 10 mg/dL (ref 8.4–10.5)
CHLORIDE: 103 meq/L (ref 96–112)
CO2: 33 mEq/L — ABNORMAL HIGH (ref 19–32)
CREATININE: 0.99 mg/dL (ref 0.40–1.20)
GFR: 68.84 mL/min (ref >60.00)
Glucose, Bld: 105 mg/dL — ABNORMAL HIGH (ref 70–99)
Potassium: 3.6 mEq/L (ref 3.5–5.1)
Sodium: 142 mEq/L (ref 135–145)
TOTAL PROTEIN: 7 g/dL (ref 6.0–8.3)
Total Bilirubin: 0.5 mg/dL (ref 0.2–1.2)

## 2018-01-05 LAB — LIPID PANEL
Cholesterol: 148 mg/dL (ref 0–200)
HDL: 68.9 mg/dL (ref >39.00)
LDL Cholesterol: 58 mg/dL (ref 0–99)
NonHDL: 79.21
Total CHOL/HDL Ratio: 2
Triglycerides: 107 mg/dL (ref 0.0–149.0)
VLDL: 21.4 mg/dL (ref 0.0–40.0)

## 2018-01-05 LAB — TSH: TSH: 2.6 u[IU]/mL (ref 0.35–4.50)

## 2018-01-05 LAB — CBC WITH DIFFERENTIAL/PLATELET
BASOS PCT: 0.8 % (ref 0.0–3.0)
Basophils Absolute: 0.1 10*3/uL (ref 0.0–0.1)
EOS ABS: 0.2 10*3/uL (ref 0.0–0.7)
Eosinophils Relative: 1.8 % (ref 0.0–5.0)
HEMATOCRIT: 40.3 % (ref 36.0–46.0)
HEMOGLOBIN: 13.9 g/dL (ref 12.0–15.0)
LYMPHS PCT: 17.9 % (ref 12.0–46.0)
Lymphs Abs: 1.9 10*3/uL (ref 0.7–4.0)
MCHC: 34.5 g/dL (ref 30.0–36.0)
MCV: 90.1 fl (ref 78.0–100.0)
Monocytes Absolute: 1.1 10*3/uL — ABNORMAL HIGH (ref 0.1–1.0)
Monocytes Relative: 10.5 % (ref 3.0–12.0)
Neutro Abs: 7.2 10*3/uL (ref 1.4–7.7)
Neutrophils Relative %: 69 % (ref 43.0–77.0)
Platelets: 160 10*3/uL (ref 150.0–400.0)
RBC: 4.47 Mil/uL (ref 3.87–5.11)
RDW: 14.9 % (ref 11.5–15.5)
WBC: 10.5 10*3/uL (ref 4.0–10.5)

## 2018-01-05 LAB — HEMOGLOBIN A1C: Hgb A1c MFr Bld: 6.6 % — ABNORMAL HIGH (ref 4.6–6.5)

## 2018-01-05 NOTE — Assessment & Plan Note (Signed)
Blood pressure on the high side and has been.  Sounds high at home at times as well Discussed possibly increasing amlodipine to 7.5 mg daily, but the patient and the daughter would prefer to keep medications as is for now Discussed potential risks of higher blood pressures damaging her kidneys further Will monitor for now CMP

## 2018-01-05 NOTE — Assessment & Plan Note (Signed)
Check A1c Will adjust medication if needed She is compliant with a diabetic diet, but not currently exercising Eye exams up-to-date Follow-up in 6 months

## 2018-01-05 NOTE — Assessment & Plan Note (Signed)
Follows with rheumatology Taking prednisone 5 mg daily.

## 2018-01-05 NOTE — Assessment & Plan Note (Signed)
Her daughter has noted some confusion or memory issues over the past couple of months No obvious causes She is interested in having this evaluated further Will refer to Dr. Si Raider Will check TSH Taking B12 supplementation We will hold off on imaging

## 2018-01-05 NOTE — Assessment & Plan Note (Signed)
Has obvious hearing loss on exam Has been tested in the past and been told that she does need hearing aids, but has not wanted to get them Discussed with her that not having hearing aids can socially isolate her and also increase her risk of memory issues Will refer back to ENT who she saw originally to consider hearing aids again

## 2018-01-05 NOTE — Assessment & Plan Note (Signed)
Check lipid panel  Continue daily statin  

## 2018-01-05 NOTE — Assessment & Plan Note (Signed)
Check tsh  Titrate med dose if needed  

## 2018-01-05 NOTE — Assessment & Plan Note (Signed)
No concerning symptoms Continue current medications Following with cardiology CBC, CMP, lipid panel

## 2018-01-17 DIAGNOSIS — M150 Primary generalized (osteo)arthritis: Secondary | ICD-10-CM | POA: Diagnosis not present

## 2018-01-17 DIAGNOSIS — M170 Bilateral primary osteoarthritis of knee: Secondary | ICD-10-CM | POA: Diagnosis not present

## 2018-01-17 DIAGNOSIS — Z79899 Other long term (current) drug therapy: Secondary | ICD-10-CM | POA: Diagnosis not present

## 2018-01-17 DIAGNOSIS — M1189 Other specified crystal arthropathies, multiple sites: Secondary | ICD-10-CM | POA: Diagnosis not present

## 2018-01-17 DIAGNOSIS — M810 Age-related osteoporosis without current pathological fracture: Secondary | ICD-10-CM | POA: Diagnosis not present

## 2018-01-17 DIAGNOSIS — M25561 Pain in right knee: Secondary | ICD-10-CM | POA: Diagnosis not present

## 2018-01-24 ENCOUNTER — Other Ambulatory Visit: Payer: Self-pay | Admitting: Internal Medicine

## 2018-01-24 DIAGNOSIS — H9193 Unspecified hearing loss, bilateral: Secondary | ICD-10-CM | POA: Diagnosis not present

## 2018-01-24 DIAGNOSIS — H903 Sensorineural hearing loss, bilateral: Secondary | ICD-10-CM | POA: Diagnosis not present

## 2018-01-31 DIAGNOSIS — M150 Primary generalized (osteo)arthritis: Secondary | ICD-10-CM | POA: Diagnosis not present

## 2018-01-31 DIAGNOSIS — M810 Age-related osteoporosis without current pathological fracture: Secondary | ICD-10-CM | POA: Diagnosis not present

## 2018-01-31 DIAGNOSIS — M1189 Other specified crystal arthropathies, multiple sites: Secondary | ICD-10-CM | POA: Diagnosis not present

## 2018-01-31 DIAGNOSIS — Z79899 Other long term (current) drug therapy: Secondary | ICD-10-CM | POA: Diagnosis not present

## 2018-01-31 DIAGNOSIS — M25561 Pain in right knee: Secondary | ICD-10-CM | POA: Diagnosis not present

## 2018-01-31 DIAGNOSIS — M170 Bilateral primary osteoarthritis of knee: Secondary | ICD-10-CM | POA: Diagnosis not present

## 2018-01-31 DIAGNOSIS — M1711 Unilateral primary osteoarthritis, right knee: Secondary | ICD-10-CM | POA: Diagnosis not present

## 2018-02-08 ENCOUNTER — Other Ambulatory Visit: Payer: Self-pay | Admitting: Cardiovascular Disease

## 2018-02-20 ENCOUNTER — Other Ambulatory Visit: Payer: Self-pay | Admitting: Internal Medicine

## 2018-02-28 ENCOUNTER — Other Ambulatory Visit: Payer: Self-pay | Admitting: Internal Medicine

## 2018-03-11 ENCOUNTER — Ambulatory Visit (INDEPENDENT_AMBULATORY_CARE_PROVIDER_SITE_OTHER): Payer: Medicare Other | Admitting: Podiatry

## 2018-03-11 DIAGNOSIS — B351 Tinea unguium: Secondary | ICD-10-CM | POA: Diagnosis not present

## 2018-03-11 DIAGNOSIS — L84 Corns and callosities: Secondary | ICD-10-CM | POA: Diagnosis not present

## 2018-03-11 DIAGNOSIS — E1151 Type 2 diabetes mellitus with diabetic peripheral angiopathy without gangrene: Secondary | ICD-10-CM | POA: Diagnosis not present

## 2018-03-11 DIAGNOSIS — M79674 Pain in right toe(s): Secondary | ICD-10-CM | POA: Diagnosis not present

## 2018-03-11 DIAGNOSIS — M79675 Pain in left toe(s): Secondary | ICD-10-CM

## 2018-03-11 NOTE — Patient Instructions (Signed)

## 2018-03-16 ENCOUNTER — Other Ambulatory Visit: Payer: Self-pay | Admitting: Internal Medicine

## 2018-03-22 ENCOUNTER — Telehealth: Payer: Self-pay

## 2018-03-22 DIAGNOSIS — R413 Other amnesia: Secondary | ICD-10-CM

## 2018-03-22 NOTE — Addendum Note (Signed)
Addended by: Binnie Rail on: 03/22/2018 08:21 PM   Modules accepted: Orders

## 2018-03-22 NOTE — Telephone Encounter (Signed)
Copied from [place] [id number]. Topic: General - Other >> Mar 22, 2018  1:27 PM Janace Aris A wrote: Reason for CRM: pt's daughter called in, she says the doctor who the provider referred the pt to for neuro- will be leaving that practice, and she was advised to call her PCP and see about getting a new referral to another location.

## 2018-03-22 NOTE — Telephone Encounter (Addendum)
Referral ordered for neurology

## 2018-03-28 ENCOUNTER — Encounter: Payer: Self-pay | Admitting: Podiatry

## 2018-03-28 NOTE — Progress Notes (Signed)
Subjective: Sheila Morgan is a 84 y.o. y.o. female who presents for preventative foot care today with diabetes with PAD and cc of painful, discolored, thick toenails and painful calluses which interfere with daily activities. Pain is aggravated when wearing enclosed shoe gear. Pain is relieved with periodic professional debridement.  Sheila Morgan voices no new concerns on today's visit.   Objective: Vascular Examination: Capillary refill time immediate x 10 digits Dorsalis pedis pulses present b/l Posterior tibial pulses absent b/l No digital hair x 10 digits Skin temperature WNL b/l  Dermatological Examination: Skin with normal turgor, texture and tone b/l  Toenails 1-5 b/l discolored, thick, dystrophic with subungual debris and pain with palpation to nailbeds due to thickness of nails.  Hyperkeratotic lesion noted submetatarsal head 5 b/l with no edema, no erythema, no drainage   Musculoskeletal: Muscle strength 5/5 to all LE muscle groups  Neurological: Sensation diminished with 10 gram monofilament. Vibratory sensation diminished  Last HgA1C: @HgA1C @  Assessment: 1. Painful onychomycosis toenails 1-5 b/l 2. Calluses submet head 5 b/l 3. NIDDM with Peripheral arterial disease 4. Diabetic neuropathy  Plan: 1. Discuss diabetic foot care principles. Literature dispensed. 2. Toenails 1-5 b/l were debrided in length and girth without iatrogenic bleeding. 3. Hyperkeratotic lesion(s)  pared with sterile chisel blade and gently smoothed with burr submet head 5 b/l 4. Patient to continue soft, supportive shoe gear 5. Patient to report any pedal injuries to medical professional  6. Follow up 3 months. Patient/POA to call should there be a concern in the interim.

## 2018-04-03 ENCOUNTER — Other Ambulatory Visit: Payer: Self-pay | Admitting: Internal Medicine

## 2018-04-08 DIAGNOSIS — H349 Unspecified retinal vascular occlusion: Secondary | ICD-10-CM | POA: Diagnosis not present

## 2018-04-08 LAB — HM DIABETES EYE EXAM

## 2018-04-20 ENCOUNTER — Other Ambulatory Visit: Payer: Self-pay | Admitting: Internal Medicine

## 2018-04-26 IMAGING — DX DG KNEE COMPLETE 4+V*R*
4 series · 4 of 4 positions shown · non-contrast
Comparison: Report of right knee series of February 09, 2012

CLINICAL DATA: Status post fall this morning with persistent
generalized pain since then.

EXAM:
RIGHT KNEE - COMPLETE 4+ VIEW

[knee ap]
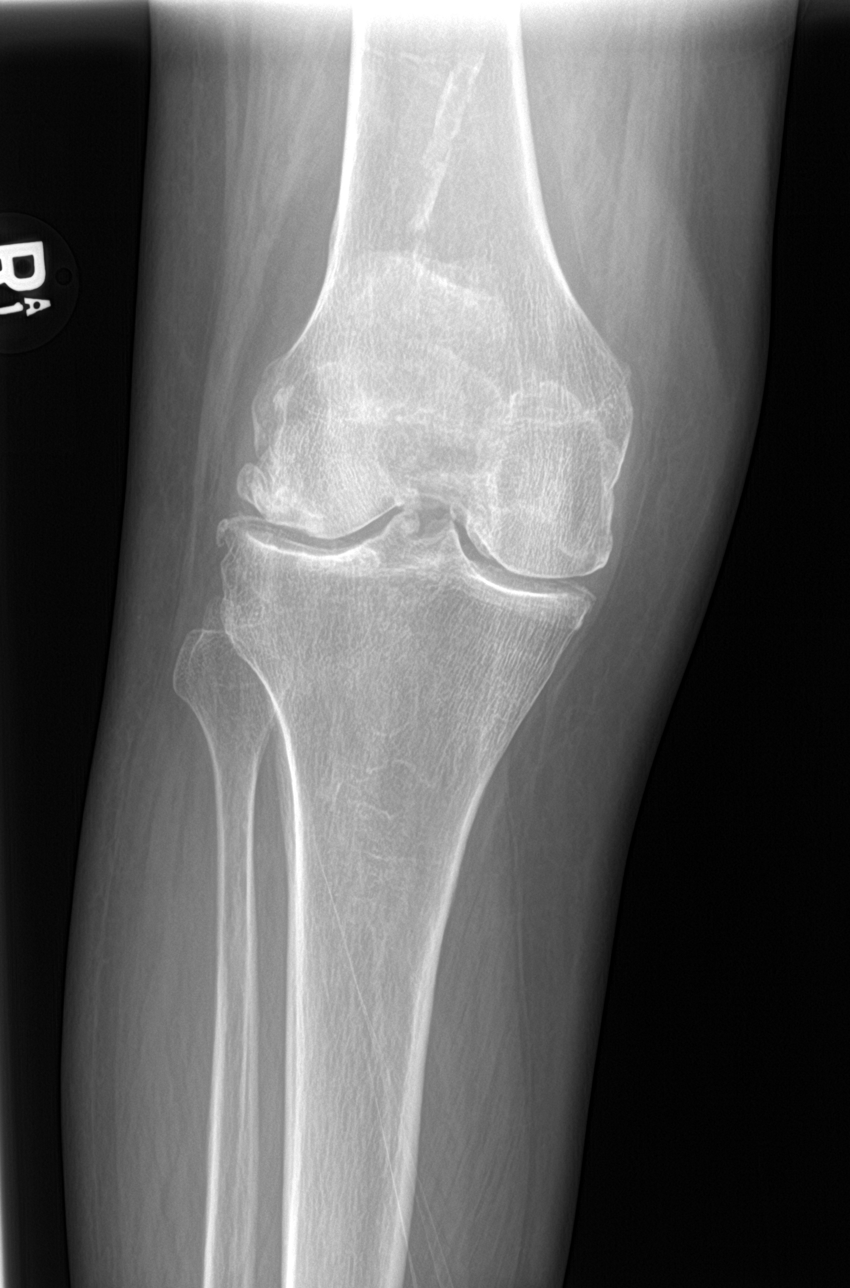

[knee lat]
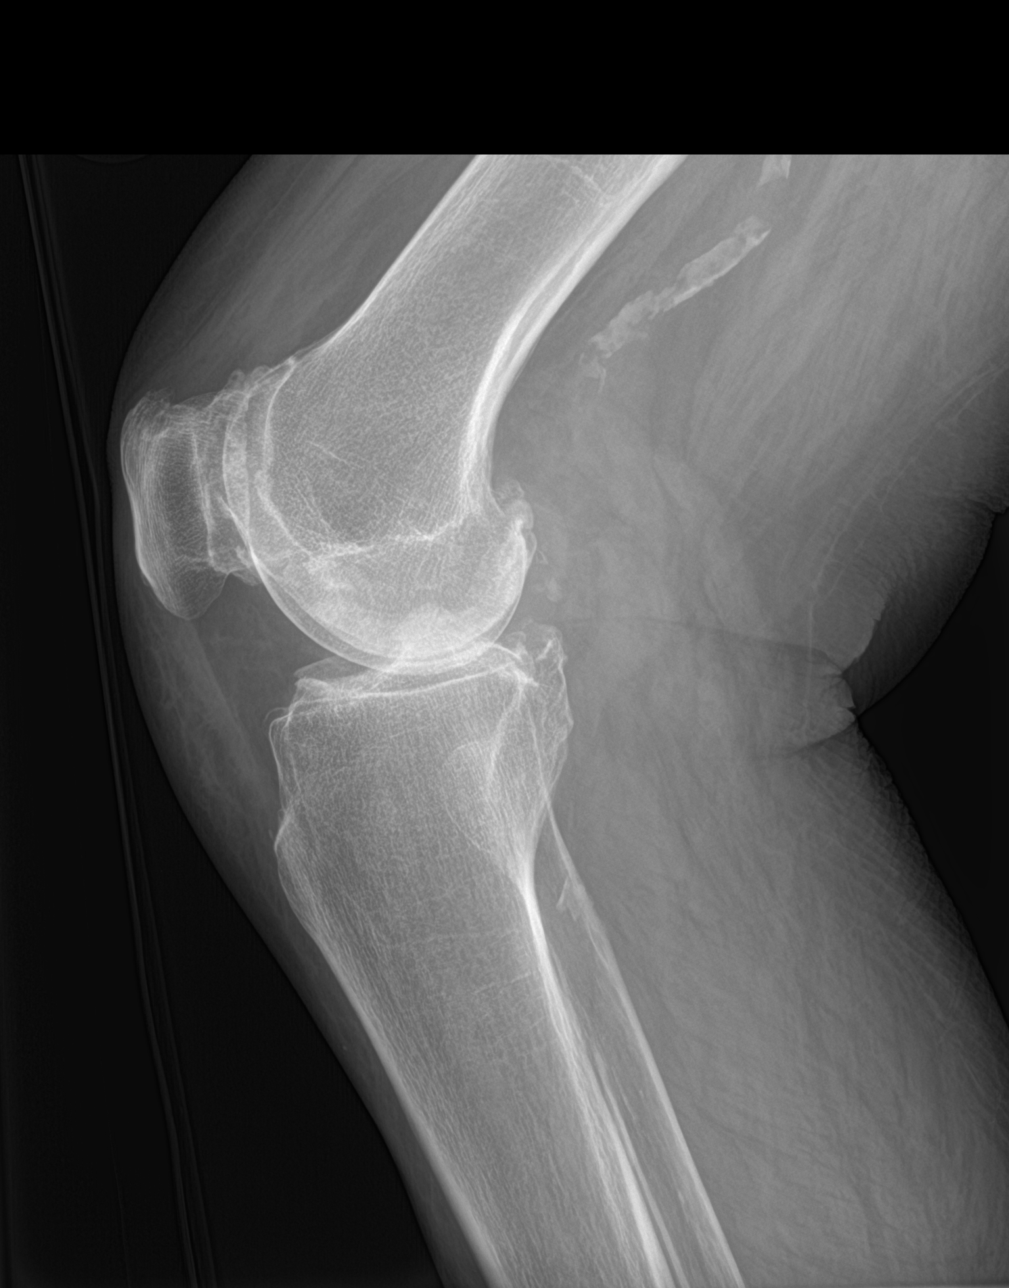

[knee obl (1 of 2)]
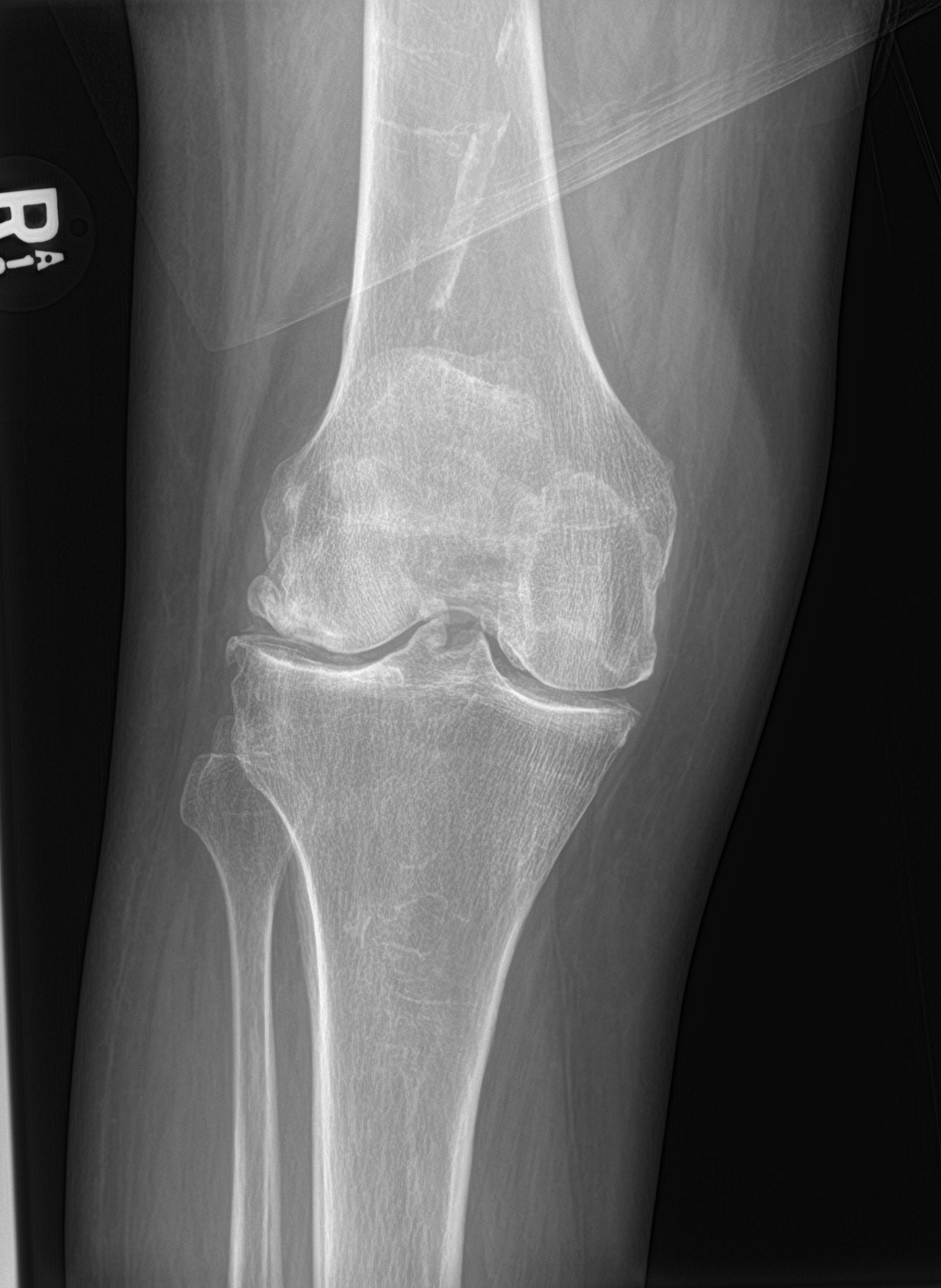

[knee obl (2 of 2)]
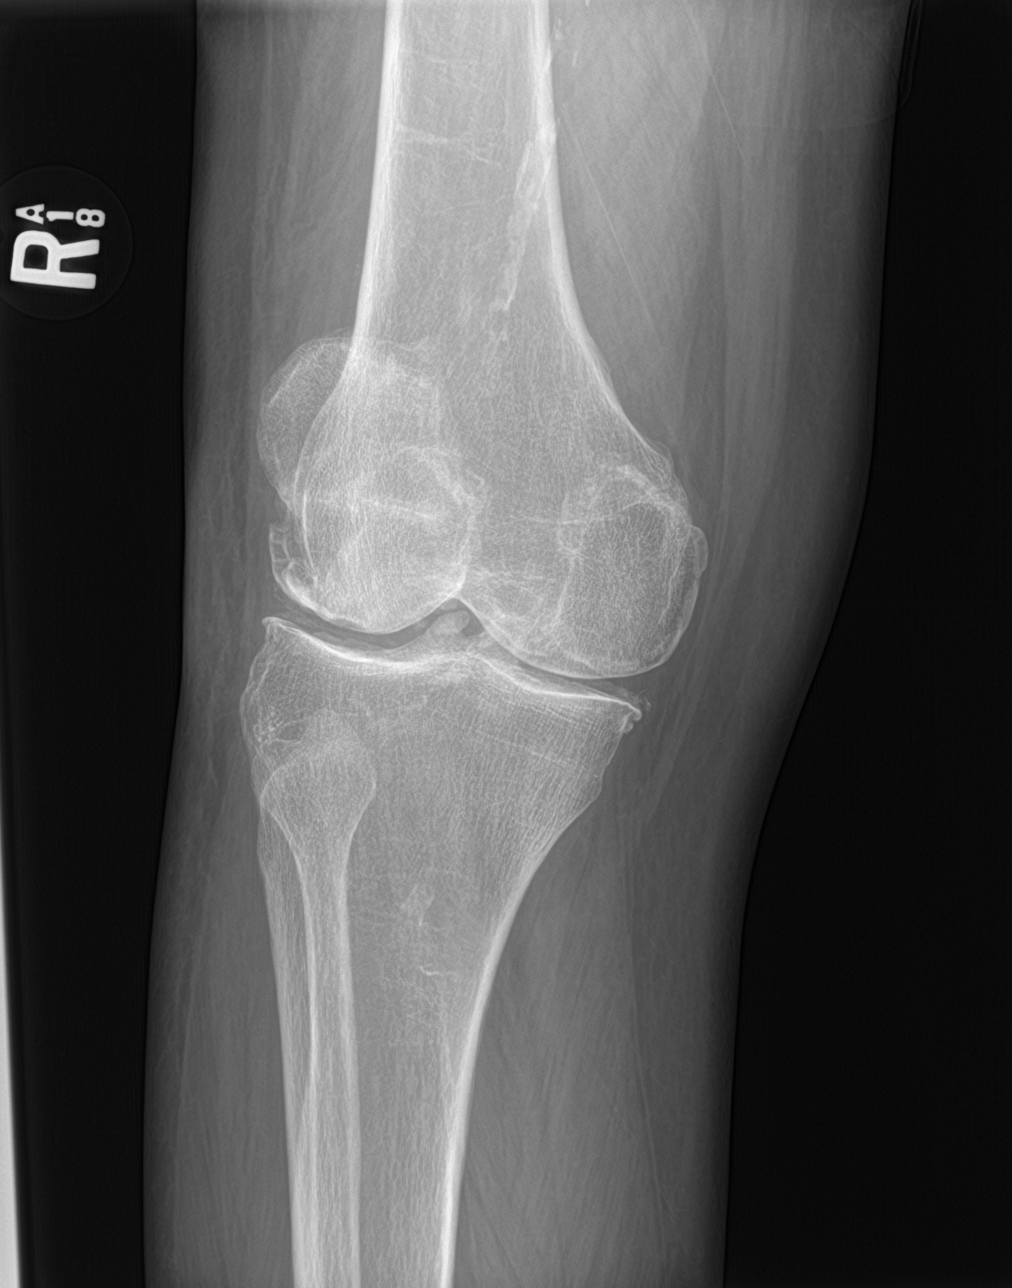

[4 of 4 positions shown; findings below may reference images not displayed]

FINDINGS: The bones are subjectively adequately mineralized. There is mild
narrowing of the medial and lateral joint compartment spaces. There
is beaking of the tibial spines. Spurs arise from the articular
margins of the lateral femoral condyle and lateral tibial plateau.
There are large osteophytes arising from the articular margins of
the patella with narrowing of the patellofemoral joint space. There
is no joint effusion or acute fracture. The proximal fibula is
unremarkable. There is calcification in the wall of the popliteal
artery and distal superficial femoral artery.
IMPRESSION: Moderate osteoarthritic change involving all 3 joint compartments.
No acute fracture or dislocation is observed.

## 2018-05-02 ENCOUNTER — Other Ambulatory Visit: Payer: Self-pay | Admitting: Internal Medicine

## 2018-05-02 MED ORDER — COLCHICINE 0.6 MG PO TABS: 0.6000 mg | ORAL_TABLET | ORAL | 3 refills | Status: DC

## 2018-05-05 ENCOUNTER — Telehealth: Payer: Self-pay | Admitting: Internal Medicine

## 2018-05-05 NOTE — Telephone Encounter (Signed)
Received a call from [facility] ( our afterhours RN service ) at 8:15pm on 05/02/18. They had received a profane and threatening phone call from the phone # associated with this patient's son, regarding a refill his mother needed. The caller was aggressive, abusive, and made threats to the practice. He never provided his name. 16/38/45 Police report filed,Risk Management & Facilities Management notified. 05/03/18 Spoke with [facility] pharmacy, they had a similar experience with the patient's son on 05/02/18. 05/05/18 Spoke with Patient's daughter, she is aware of some difficulties that her brother is having and will discuss the situation with him. She understands that if in fact the caller was her brother, he cannot call or visit our office for any reason in the future. She is apologetic and happy with the care her mother is receiving here.

## 2018-05-07 ENCOUNTER — Other Ambulatory Visit: Payer: Self-pay | Admitting: Cardiovascular Disease

## 2018-05-18 DIAGNOSIS — Z01419 Encounter for gynecological examination (general) (routine) without abnormal findings: Secondary | ICD-10-CM | POA: Diagnosis not present

## 2018-05-19 ENCOUNTER — Other Ambulatory Visit: Payer: Self-pay | Admitting: Internal Medicine

## 2018-05-19 ENCOUNTER — Telehealth: Payer: Self-pay

## 2018-05-19 DIAGNOSIS — M1711 Unilateral primary osteoarthritis, right knee: Secondary | ICD-10-CM | POA: Diagnosis not present

## 2018-05-19 DIAGNOSIS — M810 Age-related osteoporosis without current pathological fracture: Secondary | ICD-10-CM | POA: Diagnosis not present

## 2018-05-19 DIAGNOSIS — M25561 Pain in right knee: Secondary | ICD-10-CM | POA: Diagnosis not present

## 2018-05-19 DIAGNOSIS — M1189 Other specified crystal arthropathies, multiple sites: Secondary | ICD-10-CM | POA: Diagnosis not present

## 2018-05-19 DIAGNOSIS — M170 Bilateral primary osteoarthritis of knee: Secondary | ICD-10-CM | POA: Diagnosis not present

## 2018-05-19 DIAGNOSIS — M150 Primary generalized (osteo)arthritis: Secondary | ICD-10-CM | POA: Diagnosis not present

## 2018-05-19 DIAGNOSIS — Z79899 Other long term (current) drug therapy: Secondary | ICD-10-CM | POA: Diagnosis not present

## 2018-05-19 NOTE — Addendum Note (Signed)
Addended by: Binnie Rail on: 05/19/2018 02:49 PM   Modules accepted: Orders

## 2018-05-19 NOTE — Telephone Encounter (Signed)
Ordered - check with Cecille Rubin it this goes to [facility]

## 2018-05-19 NOTE — Telephone Encounter (Signed)
Copied from [place] [id number]. Topic: General - Inquiry >> May 19, 2018 11:39 AM Rutherford Nail, NT wrote: Reason for CRM: Patient's daughter calling to check and see when that patient had her last bone density scan. States that if it has not been within that last 2 years, she will need to schedule one. Please advise.  CB#: [phone] >> May 19, 2018 12:04 PM Morphies, Isidoro Donning wrote: Appears last one was done in 2015 through [facility].  Please advise on orders and where to schedule?

## 2018-05-19 NOTE — Telephone Encounter (Signed)
Last bone density was noted on 05/03/2014

## 2018-05-20 NOTE — Telephone Encounter (Signed)
Order has been faxed to [facility] 

## 2018-05-20 NOTE — Telephone Encounter (Signed)
Order does need to be put in through [facility].

## 2018-05-25 ENCOUNTER — Telehealth: Payer: Self-pay | Admitting: *Deleted

## 2018-05-25 NOTE — Telephone Encounter (Signed)
LVM for daughter to call back in regards to scheduling AWV with our health coach.

## 2018-06-01 ENCOUNTER — Other Ambulatory Visit: Payer: Self-pay | Admitting: Internal Medicine

## 2018-06-01 DIAGNOSIS — M069 Rheumatoid arthritis, unspecified: Secondary | ICD-10-CM | POA: Diagnosis not present

## 2018-06-01 DIAGNOSIS — M810 Age-related osteoporosis without current pathological fracture: Secondary | ICD-10-CM | POA: Diagnosis not present

## 2018-06-04 ENCOUNTER — Telehealth: Payer: Self-pay | Admitting: Internal Medicine

## 2018-06-04 NOTE — Telephone Encounter (Signed)
Bone density from 1/29 show osteoporosis not much different than 2015.    Is she still taking fosamax?  Her gyn prescribes this.  I am not sure how long she has been on it - if less than 7 years she can continue it.

## 2018-06-06 NOTE — Telephone Encounter (Signed)
Daughter called back and stated that she is taking fosamax. She did not know the generic name for the medicine.

## 2018-06-06 NOTE — Telephone Encounter (Signed)
Should continue Fosamax and follow-up with who is prescribing it since I do not know how long she has been taking it.

## 2018-06-06 NOTE — Telephone Encounter (Signed)
Daughter is aware 

## 2018-06-06 NOTE — Telephone Encounter (Signed)
If she tolerated the fosamax the easiest thing is to restart that and recheck her bone density in 2 years.

## 2018-06-06 NOTE — Telephone Encounter (Signed)
Pt states she is not taking the fosamax.

## 2018-06-06 NOTE — Telephone Encounter (Signed)
Spoke with pts daughter and she and the pt advise that they will try the fosamax.

## 2018-06-09 ENCOUNTER — Encounter: Payer: Self-pay | Admitting: Neurology

## 2018-06-09 ENCOUNTER — Telehealth: Payer: Self-pay | Admitting: Neurology

## 2018-06-09 ENCOUNTER — Ambulatory Visit (INDEPENDENT_AMBULATORY_CARE_PROVIDER_SITE_OTHER): Payer: Medicare Other | Admitting: Neurology

## 2018-06-09 VITALS — BP 138/82 | HR 78 | Ht 67.0 in | Wt 156.3 lb

## 2018-06-09 DIAGNOSIS — E538 Deficiency of other specified B group vitamins: Secondary | ICD-10-CM | POA: Diagnosis not present

## 2018-06-09 DIAGNOSIS — R413 Other amnesia: Secondary | ICD-10-CM | POA: Diagnosis not present

## 2018-06-09 NOTE — Telephone Encounter (Signed)
Medicare/BCBS supp order sent to [facility]. They will reach out to the pt to schedule.

## 2018-06-09 NOTE — Progress Notes (Signed)
Reason for visit: Memory disorder  Referring physician: Dr. [name] is a 85 y.o. female  History of present illness:  Sheila Morgan is an 85 year old right-handed female with a history of problems with dizziness, she was seen by Dr. Tomi Likens in 2017.  The patient underwent an MRI of the brain at that time that showed a moderate level of periventricular white matter changes, no significant ischemia to the brainstem was noted.  The patient has continued to have some dizziness that occurs only with standing, not with sitting or lying down.  The patient will note that she feels less dizzy or unsteady when she reaches over and touches something, she uses a cane for ambulation.  She has not had a recent fall, the last fall was 4 years ago.  The patient does report some numbness in the feet, she does have diabetes.  The patient has had some issues with memory over the last year, she lives alone currently, but her family looks in on her frequently.  The patient is able to manage her own medications, the daughter helps keep up with appointments, the patient does her own finances.  The patient does operate a motor vehicle going short distances, there have been no problems so far with this issue.  The patient denies issues controlling the bowels or the bladder, she does have some left shoulder pain associated with arthritis, she denies any neck pain or low back pain.  She does report some chronic fatigue but she feels that she sleeps fairly well at night.  The patient has been on chronic B12 supplementation.  Her last hemoglobin A1c was 6.6.  The patient denies any significant family history of memory disorders.  She is sent to this office for an evaluation.  Past Medical History:  Diagnosis Date  . Arthritis   . Cancer ([place])    skin on nose .  mylenoma  . Coronary artery disease    status post post LAD angioplasty in 1993  and subsequent coronary artery bypass grafting x1 with a LIMA to the LAD  July of 1994. She had stenting of her RCA with a bare-metal stent November 2002.  . Diabetes mellitus    2  . Gout   . Heart murmur   . Hematoma    Right groin  . Hemorrhoids   . Hiatal hernia   . History of blood transfusion   . HOH (hard of hearing)   . Hyperlipidemia   . Hypertension   . Left knee DJD   . Lightheadedness    has seen Dr Gwenlyn Found and ENT.. cant find out why  . Myocardial infarction ([place])   . Osteoporosis   . PONV (postoperative nausea and vomiting)   . Shingles 10/27/2013    Past Surgical History:  Procedure Laterality Date  . CORONARY ANGIOPLASTY  1993   LAD  . CORONARY ANGIOPLASTY WITH STENT PLACEMENT  03/09/01   RCA  . CORONARY ARTERY BYPASS GRAFT  1994  . JOINT REPLACEMENT Right 1998   hip  . NM MYOVIEW LTD  03/2012   Normal  . SKIN GRAFT     to nose skin cancer  . TOTAL HIP ARTHROPLASTY    . TOTAL KNEE ARTHROPLASTY Left 10/09/2013   Procedure: TOTAL KNEE ARTHROPLASTY;  Surgeon: Lorn Junes, MD;  Location: [facility];  Service: Orthopedics;  Laterality: Left;    Family History  Problem Relation Age of Onset  . Heart disease Father   .  Cancer Sister        breast  . Diabetes Sister   . Heart disease Brother   . Heart disease Brother   . Heart disease Brother   . Diabetes Sister     Social history:  reports that she has never smoked. She has never used smokeless tobacco. She reports that she does not drink alcohol or use drugs.  Medications:  Prior to Admission medications   Medication Sig Start Date End Date Taking? Authorizing Provider  alendronate (FOSAMAX) 70 MG tablet Take 70 mg by mouth once a week. Take with a full glass of water on an empty stomach.   Yes [provider]  amLODipine (NORVASC) 5 MG tablet TAKE 1 TABLET(5 MG) BY MOUTH DAILY 06/01/18  Yes Burns, Claudina Lick, MD  aspirin 81 MG tablet Take 81 mg by mouth daily.   Yes [provider]  Blood Glucose Monitoring Suppl (ONE TOUCH ULTRA MINI) W/DEVICE KIT Use to test  blood sugar twice daily ICD 10 E11 21 08/20/14  Yes Hendricks Limes, MD  Calcium Carbonate-Vitamin D (CALCIUM 600-D PO) Take 1 tablet by mouth 2 (two) times daily.    Yes [provider]  carvedilol (COREG) 25 MG tablet TAKE 1 TABLET(25 MG) BY MOUTH TWICE DAILY WITH A MEAL 04/20/18  Yes Burns, Claudina Lick, MD  colchicine (COLCRYS) 0.6 MG tablet Take 1 tablet (0.6 mg total) by mouth every other day. 05/02/18  Yes Burns, Claudina Lick, MD  diclofenac sodium (VOLTAREN) 1 % GEL Apply topically 4 (four) times daily.   Yes [provider]  furosemide (LASIX) 20 MG tablet TAKE 1 TABLET(20 MG) BY MOUTH DAILY 03/16/18  Yes Burns, Claudina Lick, MD  furosemide (LASIX) 20 MG tablet TAKE 1 TABLET(20 MG) BY MOUTH DAILY Patient taking differently: 20 mg. 1 every other day 04/04/18  Yes Burns, Claudina Lick, MD  glucose blood (ONE TOUCH ULTRA TEST) test strip Use to test blood sugar twice daily.. DX E11.51 07/28/17  Yes Binnie Rail, MD  isosorbide mononitrate (IMDUR) 30 MG 24 hr tablet Take 1 tablet (30 mg total) by mouth daily. 07/02/17  Yes Burns, Claudina Lick, MD  levothyroxine (SYNTHROID, LEVOTHROID) 50 MCG tablet Take 1 tablet (50 mcg total) by mouth daily. 07/05/17  Yes Burns, Claudina Lick, MD  losartan (COZAAR) 100 MG tablet TAKE 1 TABLET(100 MG) BY MOUTH DAILY 01/24/18  Yes Burns, Claudina Lick, MD  metFORMIN (GLUCOPHAGE) 500 MG tablet TAKE 1/2 TABLET BY MOUTH DAILY WITH LARGEST MEAL 05/19/18  Yes Burns, Claudina Lick, MD  NON FORMULARY [facility]  Onychomycosis Nail Lacquer -  Fluconazole 2%, Terbinafine 1% DMSO Apply to affected nail once daily Qty. 120 gm 3 refills   Yes [provider]  ONETOUCH DELICA LANCETS 88F MISC TEST BLOOD SUGAR TWICE DAILY. 07/28/17  Yes Burns, Claudina Lick, MD  predniSONE (DELTASONE) 5 MG tablet Take 5 mg by mouth daily.   Yes [provider]  simvastatin (ZOCOR) 40 MG tablet TAKE 1 TABLET(40 MG) BY MOUTH AT BEDTIME 05/09/18  Yes Lorretta Harp, MD  vitamin B-12 (CYANOCOBALAMIN)  1000 MCG tablet Take 1,000 mcg by mouth daily.     Yes [provider]     No Known Allergies  ROS:  Out of a complete 14 system review of symptoms, the patient complains only of the following symptoms, and all other reviewed systems are negative.  Fatigue Blurred vision Joint pain Memory loss, confusion, dizziness  There were no vitals taken for  this visit.  Physical Exam  General: The patient is alert and cooperative at the time of the examination.  Eyes: Pupils are equal, round, and reactive to light. Discs are flat bilaterally.  Neck: The neck is supple, no carotid bruits are noted.  Respiratory: The respiratory examination is clear.  Cardiovascular: The cardiovascular examination reveals a regular rate and rhythm, no obvious murmurs or rubs are noted.  Skin: Extremities are without significant edema.  The left foot is slightly smaller than the right.  Neurologic Exam  Mental status: The patient is alert and oriented x 3 at the time of the examination. The Mini-Mental status examination done today shows a total score 25/30.  Cranial nerves: Facial symmetry is present. There is good sensation of the face to pinprick and soft touch bilaterally. The strength of the facial muscles and the muscles to head turning and shoulder shrug are normal bilaterally. Speech is well enunciated, no aphasia or dysarthria is noted. Extraocular movements are full. Visual fields are full. The tongue is midline, and the patient has symmetric elevation of the soft palate. No obvious hearing deficits are noted.  Motor: The motor testing reveals 5 over 5 strength of all 4 extremities. Good symmetric motor tone is noted throughout.  Sensory: Sensory testing is intact to pinprick, soft touch, vibration sensation, and position sense on all 4 extremities.  No definite stocking pattern pinprick sensory deficit is noted.  No evidence of extinction is noted.  Coordination: Cerebellar testing  reveals good finger-nose-finger and heel-to-shin bilaterally.  Gait and station: Gait is wide-based, unsteady, the patient uses a cane for ambulation.  Tandem gait was not attempted.  Romberg is negative.  Reflexes: Deep tendon reflexes are symmetric and normal bilaterally in the upper extremities, with the lower extremities there is a decrease in the left knee jerk reflex as compared to the right, ankle jerk reflexes are depressed bilaterally. Toes are downgoing bilaterally.   MRI brain 07/21/15:  IMPRESSION: Atrophy and chronic microvascular ischemia.  No acute abnormality.  * MRI scan images were reviewed online. I agree with the written report.  A moderate level of periventricular white matter changes is seen.    Assessment/Plan:  1.  Chronic gait disorder, reports of dizziness or sensation of instability  2.  Memory disorder  3.  Small vessel disease by MRI  The patient does have a moderate level small vessel disease by MRI scan, this could potentially contribute some of the dizziness and gait instability and memory problems.  The patient will undergo some blood work today.  The patient does not wish to go on medication for memory.  She will follow-up in 6 months.  We will do a CT scan of the brain.   Jill Alexanders MD 06/09/2018 9:18 AM  [facility] [address] [place], [place] [address]  Phone [phone] Fax [phone]

## 2018-06-10 LAB — RPR: RPR: NONREACTIVE

## 2018-06-10 LAB — SEDIMENTATION RATE: Sed Rate: 2 mm/hr (ref 0–40)

## 2018-06-10 LAB — VITAMIN B12

## 2018-06-11 ENCOUNTER — Other Ambulatory Visit: Payer: Self-pay | Admitting: Internal Medicine

## 2018-06-14 ENCOUNTER — Ambulatory Visit (INDEPENDENT_AMBULATORY_CARE_PROVIDER_SITE_OTHER): Payer: Medicare Other | Admitting: Podiatry

## 2018-06-14 ENCOUNTER — Encounter: Payer: Self-pay | Admitting: Podiatry

## 2018-06-14 DIAGNOSIS — E1142 Type 2 diabetes mellitus with diabetic polyneuropathy: Secondary | ICD-10-CM | POA: Diagnosis not present

## 2018-06-14 DIAGNOSIS — M79675 Pain in left toe(s): Secondary | ICD-10-CM | POA: Diagnosis not present

## 2018-06-14 DIAGNOSIS — M79674 Pain in right toe(s): Secondary | ICD-10-CM

## 2018-06-14 DIAGNOSIS — B351 Tinea unguium: Secondary | ICD-10-CM

## 2018-06-14 DIAGNOSIS — E1151 Type 2 diabetes mellitus with diabetic peripheral angiopathy without gangrene: Secondary | ICD-10-CM

## 2018-06-14 DIAGNOSIS — L84 Corns and callosities: Secondary | ICD-10-CM

## 2018-06-14 NOTE — Progress Notes (Signed)
Subjective: Sheila Morgan presents today for diabetic preventative foot care.  She is accompanied by her daughter today.  She is routinely seen for painful, discolored, thick toenails and painful plantar calluses of both feet which interfere with activities of daily living. Pain is aggravated when wearing enclosed shoe gear and is relieved with periodic professional debridement.  Daughter states mom is complaining of pain in her left great toe.  She states pain is been present since her last visit.  She states pain is located on the medial border and is aggravated when wearing enclosed shoe gear.  She denies any redness drainage or swelling of the area.  Binnie Rail, MD is her PCP.  Last visit January 05, 2018.   Current Outpatient Medications:  .  alendronate (FOSAMAX) 70 MG tablet, Take 70 mg by mouth once a week. Take with a full glass of water on an empty stomach., Disp: , Rfl:  .  amLODipine (NORVASC) 5 MG tablet, TAKE 1 TABLET(5 MG) BY MOUTH DAILY, Disp: 90 tablet, Rfl: 1 .  aspirin 81 MG tablet, Take 81 mg by mouth daily., Disp: , Rfl:  .  Blood Glucose Monitoring Suppl (ONE TOUCH ULTRA MINI) W/DEVICE KIT, Use to test blood sugar twice daily ICD 10 E11 21, Disp: 1 each, Rfl: 0 .  Calcium Carbonate-Vitamin D (CALCIUM 600-D PO), Take 1 tablet by mouth 2 (two) times daily. , Disp: , Rfl:  .  carvedilol (COREG) 25 MG tablet, TAKE 1 TABLET(25 MG) BY MOUTH TWICE DAILY WITH A MEAL, Disp: 180 tablet, Rfl: 1 .  colchicine (COLCRYS) 0.6 MG tablet, Take 1 tablet (0.6 mg total) by mouth every other day., Disp: 45 tablet, Rfl: 3 .  diclofenac sodium (VOLTAREN) 1 % GEL, Apply topically 4 (four) times daily., Disp: , Rfl:  .  furosemide (LASIX) 20 MG tablet, TAKE 1 TABLET(20 MG) BY MOUTH DAILY (Patient taking differently: 20 mg. 1 every other day), Disp: 90 tablet, Rfl: 0 .  furosemide (LASIX) 20 MG tablet, TAKE 1 TABLET(20 MG) BY MOUTH DAILY, Disp: 90 tablet, Rfl: 1 .  glucose blood (ONE TOUCH  ULTRA TEST) test strip, Use to test blood sugar twice daily.. DX E11.51, Disp: 200 each, Rfl: 12 .  isosorbide mononitrate (IMDUR) 30 MG 24 hr tablet, Take 1 tablet (30 mg total) by mouth daily., Disp: 90 tablet, Rfl: 3 .  levothyroxine (SYNTHROID, LEVOTHROID) 50 MCG tablet, Take 1 tablet (50 mcg total) by mouth daily., Disp: 90 tablet, Rfl: 1 .  losartan (COZAAR) 100 MG tablet, TAKE 1 TABLET(100 MG) BY MOUTH DAILY, Disp: 90 tablet, Rfl: 1 .  metFORMIN (GLUCOPHAGE) 500 MG tablet, TAKE 1/2 TABLET BY MOUTH DAILY WITH LARGEST MEAL, Disp: 45 tablet, Rfl: 0 .  NON FORMULARY, [facility]  Onychomycosis Nail Lacquer -  Fluconazole 2%, Terbinafine 1% DMSO Apply to affected nail once daily Qty. 120 gm 3 refills, Disp: , Rfl:  .  ONETOUCH DELICA LANCETS 02D MISC, TEST BLOOD SUGAR TWICE DAILY., Disp: 200 each, Rfl: 1 .  predniSONE (DELTASONE) 5 MG tablet, Take 5 mg by mouth daily., Disp: , Rfl:  .  simvastatin (ZOCOR) 40 MG tablet, TAKE 1 TABLET(40 MG) BY MOUTH AT BEDTIME, Disp: 90 tablet, Rfl: 0 .  vitamin B-12 (CYANOCOBALAMIN) 1000 MCG tablet, Take 1,000 mcg by mouth daily.  , Disp: , Rfl:   No Known Allergies  Vascular Examination: Capillary refill time immediate x10 digits Dorsalis pedis pulses present bilaterally Posterior tibial pulses are absent bilaterally No digital  hair x10 digits Skin temperature gradient within normal limits bilaterally   Dermatological Examination: Skin with normal turgor, texture and tone b/l  Toenails 1-5 b/l discolored, thick, dystrophic with subungual debris and pain with palpation to nailbeds due to thickness of nails.  Left great toe nail noted to be incurvated at the medial border.  There is tenderness to palpation.  There is skin irritation noted.  No edema, no drainage present, no purulence noted.  Hyperkeratotic lesion submetatarsal head 5 bilaterally.  There is tenderness to palpation.  There is no edema, no erythema, no drainage, no flocculence  noted.  Musculoskeletal: Muscle strength 5/5 to all LE muscle groups  Neurological: Sensation diminished with 10 gram monofilament. Vibratory sensation diminished  Assessment: 1. Painful onychomycosis toenails 1-5 b/l 2. Ingrown toenail medial border left great toe 3. Calluses submetatarsal head 5 bilaterally 4. NIDDM with Diabetic neuropathy  Plan: 1. Continue diabetic foot care principles.  Literature dispensed on today. 2. Toenails 1-5 b/l were debrided in length and girth without iatrogenic bleeding.  A small amount of lidocaine plain cream was applied to the painful nail border left great toe and offending nail border left great toe was debrided and curettaged.  Border was cleansed with alcohol. Triple antibiotic ointment was applied.  Patient noted relief posttreatment today.  3. Hyperkeratotic lesion submetatarsal head 5 bilaterally pared with sterile scalpel blade without incident.   4. Patient to continue soft, supportive shoe gear.  We discussed Medicare diabetic shoe program.  Patient and daughter are interested in the program.  Qualifying diagnoses for diabetic shoes are NIDDM with peripheral arterial disease, diabetic neuropathy, calluses submetatarsal head 5 bilaterally. 5. Patient to report any pedal injuries to medical professional immediately. 6. Follow up 3 months.  7. Patient/POA to call should there be a concern in the interim.

## 2018-06-14 NOTE — Patient Instructions (Signed)
Onychomycosis/Fungal Toenails  WHAT IS IT? An infection that lies within the keratin of your nail plate that is caused by a fungus.  WHY ME? Fungal infections affect all ages, sexes, races, and creeds.  There may be many factors that predispose you to a fungal infection such as age, coexisting medical conditions such as diabetes, or an autoimmune disease; stress, medications, fatigue, genetics, etc.  Bottom line: fungus thrives in a warm, moist environment and your shoes offer such a location.  IS IT CONTAGIOUS? Theoretically, yes.  You do not want to share shoes, nail clippers or files with someone who has fungal toenails.  Walking around barefoot in the same room or sleeping in the same bed is unlikely to transfer the organism.  It is important to realize, however, that fungus can spread easily from one nail to the next on the same foot.  HOW DO WE TREAT THIS?  There are several ways to treat this condition.  Treatment may depend on many factors such as age, medications, pregnancy, liver and kidney conditions, etc.  It is best to ask your doctor which options are available to you.  1. No treatment.   Unlike many other medical concerns, you can live with this condition.  However for many people this can be a painful condition and may lead to ingrown toenails or a bacterial infection.  It is recommended that you keep the nails cut short to help reduce the amount of fungal nail. 2. Topical treatment.  These range from herbal remedies to prescription strength nail lacquers.  About 40-50% effective, topicals require twice daily application for approximately 9 to 12 months or until an entirely new nail has grown out.  The most effective topicals are medical grade medications available through physicians offices. 3. Oral antifungal medications.  With an 80-90% cure rate, the most common oral medication requires 3 to 4 months of therapy and stays in your system for a year as the new nail grows out.  Oral  antifungal medications do require blood work to make sure it is a safe drug for you.  A liver function panel will be performed prior to starting the medication and after the first month of treatment.  It is important to have the blood work performed to avoid any harmful side effects.  In general, this medication safe but blood work is required. 4. Laser Therapy.  This treatment is performed by applying a specialized laser to the affected nail plate.  This therapy is noninvasive, fast, and non-painful.  It is not covered by insurance and is therefore, out of pocket.  The results have been very good with a 80-95% cure rate.  [facility] is the only practice in the area to offer this therapy. Permanent Nail Avulsion.  Removing the entire nail so that a new nail will not grow back.Diabetes Mellitus and Foot Care Foot care is an important part of your health, especially when you have diabetes. Diabetes may cause you to have problems because of poor blood flow (circulation) to your feet and legs, which can cause your skin to:  Become thinner and drier.  Break more easily.  Heal more slowly.  Peel and crack. You may also have nerve damage (neuropathy) in your legs and feet, causing decreased feeling in them. This means that you may not notice minor injuries to your feet that could lead to more serious problems. Noticing and addressing any potential problems early is the best way to prevent future foot problems.   How to care for your feet Foot hygiene  Wash your feet daily with warm water and mild soap. Do not use hot water. Then, pat your feet and the areas between your toes until they are completely dry. Do not soak your feet as this can dry your skin.  Trim your toenails straight across. Do not dig under them or around the cuticle. File the edges of your nails with an emery board or nail file.  Apply a moisturizing lotion or petroleum jelly to the skin on your feet and to dry, brittle  toenails. Use lotion that does not contain alcohol and is unscented. Do not apply lotion between your toes. Shoes and socks  Wear clean socks or stockings every day. Make sure they are not too tight. Do not wear knee-high stockings since they may decrease blood flow to your legs.  Wear shoes that fit properly and have enough cushioning. Always look in your shoes before you put them on to be sure there are no objects inside.  To break in new shoes, wear them for just a few hours a day. This prevents injuries on your feet. Wounds, scrapes, corns, and calluses  Check your feet daily for blisters, cuts, bruises, sores, and redness. If you cannot see the bottom of your feet, use a mirror or ask someone for help.  Do not cut corns or calluses or try to remove them with medicine.  If you find a minor scrape, cut, or break in the skin on your feet, keep it and the skin around it clean and dry. You may clean these areas with mild soap and water. Do not clean the area with peroxide, alcohol, or iodine.  If you have a wound, scrape, corn, or callus on your foot, look at it several times a day to make sure it is healing and not infected. Check for: ? Redness, swelling, or pain. ? Fluid or blood. ? Warmth. ? Pus or a bad smell. General instructions  Do not cross your legs. This may decrease blood flow to your feet.  Do not use heating pads or hot water bottles on your feet. They may burn your skin. If you have lost feeling in your feet or legs, you may not know this is happening until it is too late.  Protect your feet from hot and cold by wearing shoes, such as at the beach or on hot pavement.  Schedule a complete foot exam at least once a year (annually) or more often if you have foot problems. If you have foot problems, report any cuts, sores, or bruises to your health care provider immediately. Contact a health care provider if:  You have a medical condition that increases your risk of  infection and you have any cuts, sores, or bruises on your feet.  You have an injury that is not healing.  You have redness on your legs or feet.  You feel burning or tingling in your legs or feet.  You have pain or cramps in your legs and feet.  Your legs or feet are numb.  Your feet always feel cold.  You have pain around a toenail. Get help right away if:  You have a wound, scrape, corn, or callus on your foot and: ? You have pain, swelling, or redness that gets worse. ? You have fluid or blood coming from the wound, scrape, corn, or callus. ? Your wound, scrape, corn, or callus feels warm to the touch. ? You have pus or   a bad smell coming from the wound, scrape, corn, or callus. ? You have a fever. ? You have a red line going up your leg. Summary  Check your feet every day for cuts, sores, red spots, swelling, and blisters.  Moisturize feet and legs daily.  Wear shoes that fit properly and have enough cushioning.  If you have foot problems, report any cuts, sores, or bruises to your health care provider immediately.  Schedule a complete foot exam at least once a year (annually) or more often if you have foot problems. This information is not intended to replace advice given to you by your health care provider. Make sure you discuss any questions you have with your health care provider. Document Released: 04/17/2000 Document Revised: 06/02/2017 Document Reviewed: 05/22/2016 Elsevier Interactive Patient Education  2019 Elsevier Inc. Corns and Calluses Corns are small areas of thickened skin that occur on the top, sides, or tip of a toe. They contain a cone-shaped core with a point that can press on a nerve below. This causes pain.  Calluses are areas of thickened skin that can occur anywhere on the body, including the hands, fingers, palms, soles of the feet, and heels. Calluses are usually larger than corns. What are the causes? Corns and calluses are caused by rubbing  (friction) or pressure, such as from shoes that are too tight or do not fit properly. What increases the risk? Corns are more likely to develop in people who have misshapen toes (toe deformities), such as hammer toes. Calluses can occur with friction to any area of the skin. They are more likely to develop in people who:  Work with their hands.  Wear shoes that fit poorly, are too tight, or are high-heeled.  Have toe deformities. What are the signs or symptoms? Symptoms of a corn or callus include:  A hard growth on the skin.  Pain or tenderness under the skin.  Redness and swelling.  Increased discomfort while wearing tight-fitting shoes, if your feet are affected. If a corn or callus becomes infected, symptoms may include:  Redness and swelling that gets worse.  Pain.  Fluid, blood, or pus draining from the corn or callus. How is this diagnosed? Corns and calluses may be diagnosed based on your symptoms, your medical history, and a physical exam. How is this treated? Treatment for corns and calluses may include:  Removing the cause of the friction or pressure. This may involve: ? Changing your shoes. ? Wearing shoe inserts (orthotics) or other protective layers in your shoes, such as a corn pad. ? Wearing gloves.  Applying medicine to the skin (topical medicine) to help soften skin in the hardened, thickened areas.  Removing layers of dead skin with a file to reduce the size of the corn or callus.  Removing the corn or callus with a scalpel or laser.  Taking antibiotic medicines, if your corn or callus is infected.  Having surgery, if a toe deformity is the cause. Follow these instructions at home:   Take over-the-counter and prescription medicines only as told by your health care provider.  If you were prescribed an antibiotic, take it as told by your health care provider. Do not stop taking it even if your condition starts to improve.  Wear shoes that fit  well. Avoid wearing high-heeled shoes and shoes that are too tight or too loose.  Wear any padding, protective layers, gloves, or orthotics as told by your health care provider.  Soak your hands or feet   and then use a file or pumice stone to soften your corn or callus. Do this as told by your health care provider.  Check your corn or callus every day for symptoms of infection. Contact a health care provider if you:  Notice that your symptoms do not improve with treatment.  Have redness or swelling that gets worse.  Notice that your corn or callus becomes painful.  Have fluid, blood, or pus coming from your corn or callus.  Have new symptoms. Summary  Corns are small areas of thickened skin that occur on the top, sides, or tip of a toe.  Calluses are areas of thickened skin that can occur anywhere on the body, including the hands, fingers, palms, and soles of the feet. Calluses are usually larger than corns.  Corns and calluses are caused by rubbing (friction) or pressure, such as from shoes that are too tight or do not fit properly.  Treatment may include wearing any padding, protective layers, gloves, or orthotics as told by your health care provider. This information is not intended to replace advice given to you by your health care provider. Make sure you discuss any questions you have with your health care provider. Document Released: 01/25/2004 Document Revised: 03/03/2017 Document Reviewed: 03/03/2017 Elsevier Interactive Patient Education  2019 Elsevier Inc.  

## 2018-06-15 ENCOUNTER — Ambulatory Visit
Admission: RE | Admit: 2018-06-15 | Discharge: 2018-06-15 | Disposition: A | Discharge status: Home / Self Care | Payer: Medicare Other | Source: Ambulatory Visit | Attending: Neurology | Admitting: Neurology

## 2018-06-15 DIAGNOSIS — R413 Other amnesia: Secondary | ICD-10-CM

## 2018-06-17 ENCOUNTER — Telehealth: Payer: Self-pay | Admitting: Neurology

## 2018-06-17 NOTE — Telephone Encounter (Signed)
Call the patient.  The CT of the head continues to show a moderate level of small vessel disease that likely has some impact on her dizziness and balance issues and possibly with the memory.  No acute changes seen, no real change from prior study in 2017.  The patient will contact our office if she wishes to go on any medication for memory.   CT head 06/17/18:  IMPRESSION:   CT head (without) demonstrating: - Moderate periventricular and subcortical chronic small vessel ischemic disease.  - Chronic bilateral thalamic lacunar ischemic infarctions. - No acute findings. No major new findings from MRI in 2017.

## 2018-07-01 ENCOUNTER — Other Ambulatory Visit: Payer: Self-pay | Admitting: Internal Medicine

## 2018-07-05 NOTE — Progress Notes (Addendum)
Subjective:   Sheila Morgan is a 85 y.o. female who presents for Medicare Annual (Subsequent) preventive examination.  Review of Systems:  No ROS.  Medicare Wellness Visit. Additional risk factors are reflected in the social history.  Cardiac Risk Factors include: advanced age (>62mn, >>11women);diabetes mellitus;dyslipidemia;hypertension;sedentary lifestyle Sleep patterns: feels rested on waking, gets up 3 times nightly to void and sleeps 8-9 hours nightly.    Home Safety/Smoke Alarms: Feels safe in home. Smoke alarms in place.  Living environment; residence and Firearm Safety: 2-story house, equipment: CRadio producer Type: SOlneyand HOmnicom Type: Raised TProgramme researcher, broadcasting/film/video tub bench chair lift for stairs. Lives alone, states she needs replacement tub bench and adjustable toilet seat raise (BSC), good support system Seat Belt Safety/Bike Helmet: Wears seat belt.     Objective:     Vitals: BP (!) 152/76   Pulse 77   Ht 5' 7"  (1.702 m)   Wt 158 lb (71.7 kg)   SpO2 99%   BMI 24.75 kg/m   Body mass index is 24.75 kg/m.  Advanced Directives 07/06/2018 07/02/2017 12/23/2015 10/09/2013 09/28/2013 05/09/2013 06/19/2011  Does Patient Have a Medical Advance Directive? No No Yes Patient does not have advance directive;Patient would like information Patient does not have advance directive;Patient would like information Patient does not have advance directive;Patient would not like information Patient has advance directive, copy not in chart  Type of Advance Directive - - - - - - HPress photographerLiving will  Copy of [place] Chart? - - Yes - - - Copy requested from family  Would patient like information on creating a medical advance directive? Yes (ED - Information included in AVS) Yes (ED - Information included in AVS) - - Advance directive packet given - -  Pre-existing out of facility DNR order (yellow form or pink MOST form) - - - - No - No     Tobacco Social History   Tobacco Use  Smoking Status Never Smoker  Smokeless Tobacco Never Used     Counseling given: Not Answered  Past Medical History:  Diagnosis Date  . Arthritis   . [place]    skin [place] nose .  mylenoma  . Coronary artery disease    status post post LAD angioplasty in 1993  and subsequent coronary artery bypass grafting x1 with a LIMA to the LAD July of 1994. She had stenting of her RCA with a bare-metal stent November 2002.  . Diabetes mellitus    2  . Gout   . Heart murmur   . Hematoma    Right groin  . Hemorrhoids   . Hiatal hernia   . History of blood transfusion   . HOH (hard of hearing)   . Hyperlipidemia   . Hypertension   . Left knee DJD   . Lightheadedness    has seen Dr BGwenlyn Foundand ENT.. cant find out why  . Myocardial infarction ([place]   . Osteoporosis   . PONV (postoperative nausea and vomiting)   . Shingles 10/27/2013   Past Surgical History:  Procedure Laterality Date  . CORONARY ANGIOPLASTY  1993   LAD  . CORONARY ANGIOPLASTY WITH STENT PLACEMENT  03/09/01   RCA  . CORONARY ARTERY BYPASS GRAFT  1994  . JOINT REPLACEMENT Right 1998   hip  . NM MYOVIEW LTD  03/2012   Normal  . SKIN GRAFT     to nose skin cancer  . TOTAL HIP ARTHROPLASTY    .  TOTAL KNEE ARTHROPLASTY Left 10/09/2013   Procedure: TOTAL KNEE ARTHROPLASTY;  Surgeon: Lorn Junes, MD;  Location: [place];  Service: Orthopedics;  Laterality: Left;   Family History  Problem Relation Age of Onset  . Heart disease Father   . Cancer Sister        breast  . Diabetes Sister   . Heart disease Brother   . Heart disease Brother   . Heart disease Brother   . Diabetes Sister   . Stroke Mother    Social History   Socioeconomic History  . Marital status: Widowed    Spouse name: Not on file  . Number of children: 2  . Years of education: Not on file  . Highest education level: 12th grade  Occupational History  . Occupation: Retired   Scientific laboratory technician  . Financial  resource strain: Not hard at all  . Food insecurity:    Worry: Never true    Inability: Never true  . Transportation needs:    Medical: No    Non-medical: No  Tobacco Use  . Smoking status: Never Smoker  . Smokeless tobacco: Never Used  Substance and Sexual Activity  . Alcohol use: No  . Drug use: No  . Sexual activity: Never  Lifestyle  . Physical activity:    Days per week: 0 days    Minutes per session: 0 min  . Stress: Not at all  Relationships  . Social connections:    Talks on phone: More than three times a week    Gets together: More than three times a week    Attends religious service: More than 4 times per year    Active member of club or organization: Yes    Attends meetings of clubs or organizations: More than 4 times per year    Relationship status: Widowed  Other Topics Concern  . Not on file  Social History Narrative   Left handed    Caffeine 4 cups daily    Lives at home alone     Outpatient Encounter Medications as of 07/06/2018  Medication Sig  . alendronate (FOSAMAX) 70 MG tablet Take 70 mg by mouth once a week. Take with a full glass of water on an empty stomach.  Marland Kitchen amLODipine (NORVASC) 5 MG tablet TAKE 1 TABLET(5 MG) BY MOUTH DAILY  . aspirin 81 MG tablet Take 81 mg by mouth daily.  . Blood Glucose Monitoring Suppl (ONE TOUCH ULTRA MINI) W/DEVICE KIT Use to test blood sugar twice daily ICD 10 E11 21  . Calcium Carbonate-Vitamin D (CALCIUM 600-D PO) Take 1 tablet by mouth 2 (two) times daily.   . carvedilol (COREG) 25 MG tablet TAKE 1 TABLET(25 MG) BY MOUTH TWICE DAILY WITH A MEAL  . colchicine (COLCRYS) 0.6 MG tablet Take 1 tablet (0.6 mg total) by mouth every other day.  . furosemide (LASIX) 20 MG tablet TAKE 1 TABLET(20 MG) BY MOUTH DAILY (Patient taking differently: 20 mg. 1 every other day)  . furosemide (LASIX) 20 MG tablet TAKE 1 TABLET(20 MG) BY MOUTH DAILY  . glucose blood (ONE TOUCH ULTRA TEST) test strip Use to test blood sugar twice daily..  DX E11.51  . isosorbide mononitrate (IMDUR) 30 MG 24 hr tablet Take 1 tablet (30 mg total) by mouth daily.  Marland Kitchen levothyroxine (SYNTHROID, LEVOTHROID) 50 MCG tablet TAKE 1 TABLET(50 MCG) BY MOUTH DAILY  . losartan (COZAAR) 100 MG tablet TAKE 1 TABLET(100 MG) BY MOUTH DAILY  . metFORMIN (GLUCOPHAGE) 500 MG  tablet TAKE 1/2 TABLET BY MOUTH DAILY WITH LARGEST MEAL  . ONETOUCH DELICA LANCETS 66A MISC TEST BLOOD SUGAR TWICE DAILY.  Marland Kitchen predniSONE (DELTASONE) 5 MG tablet Take 5 mg by mouth daily.  . simvastatin (ZOCOR) 40 MG tablet TAKE 1 TABLET(40 MG) BY MOUTH AT BEDTIME  . vitamin B-12 (CYANOCOBALAMIN) 1000 MCG tablet Take 1,000 mcg by mouth daily.    . [DISCONTINUED] diclofenac sodium (VOLTAREN) 1 % GEL Apply topically 4 (four) times daily.  . [DISCONTINUED] NON FORMULARY [facility]  Onychomycosis Nail Lacquer -  Fluconazole 2%, Terbinafine 1% DMSO Apply to affected nail once daily Qty. 120 gm 3 refills   No facility-administered encounter medications on file as of 07/06/2018.     Activities of Daily Living In your present state of health, do you have any difficulty performing the following activities: 07/06/2018  Hearing? Y  Vision? N  Difficulty concentrating or making decisions? Y  Walking or climbing stairs? N  Dressing or bathing? N  Doing errands, shopping? Y  Preparing Food and eating ? Y  Using the Toilet? N  In the past six months, have you accidently leaked urine? N  Do you have problems with loss of bowel control? N  Managing your Medications? Y  Managing your Finances? Y  Housekeeping or managing your Housekeeping? Y  Some recent data might be hidden    Patient Care Team: Binnie Rail, MD as PCP - General (Internal Medicine) Garald Balding, MD as Consulting Physician (Orthopedic Surgery) Baldwin [place], Lonzo Cloud as Physician Assistant (Orthopedic Surgery) Marzetta Board, DPM as Consulting Physician (Podiatry) Rogers, Nancy Marus, AUD  (Audiology) Kathrynn Ducking, MD as Consulting Physician (Neurology)    Assessment:   This is a routine wellness examination for Sheila Morgan. Physical assessment deferred to PCP.  Exercise Activities and Dietary recommendations Current Exercise Habits: The patient does not participate in regular exercise at present(AHOY information for chair exercises on TV provided), Exercise limited by: orthopedic condition(s)  Diet (meal preparation, eat out, water intake, caffeinated beverages, dairy products, fruits and vegetables): in general, a "healthy" diet  , on average, 2 meals per day   Reviewed heart healthy and diabetic diet. Encouraged patient to increase daily water and healthy fluid intake.  Discussed supplementing with Glucerna, samples and coupons provided.  Goals    . patient     Exercise; discussed weight bearing; walking and using small weights to strengthen arms Walk, walk, walk      . Patient Stated     Set out 2-3 bottles of water daily, put it by my TV chair to remind me to drink. Stay as healthy and as independent as possible    . Patient Stated     Maintain current health status.       Fall Risk Fall Risk  07/06/2018 06/09/2018 07/02/2017 01/19/2017 12/23/2015  Falls in the past year? 0 0 Yes Yes No  Number falls in past yr: - 0 1 2 or more -  Injury with Fall? - 0 Yes Yes -  Comment - - - - -  Risk Factor Category  - - - High Fall Risk -  Risk for fall due to : Impaired balance/gait;Impaired mobility - Impaired balance/gait;Impaired mobility History of fall(s);Impaired balance/gait;Impaired mobility -  Follow up Falls prevention discussed;Education provided - Falls prevention discussed;Education provided - -   Depression Screen PHQ 2/9 Scores 07/06/2018 07/02/2017 01/19/2017 12/23/2015  PHQ - 2 Score 0 0 0 0  PHQ- 9 Score - 5 - -  Cognitive Function MMSE - Mini Mental State Exam 07/06/2018 06/09/2018 07/02/2017  Not completed: (No Data) - -  Orientation to time - 5 4   Orientation to Place - 4 5  Registration - 3 3  Attention/ Calculation - 5 3  Recall - 2 1  Language- name 2 objects - 2 2  Language- repeat - 0 1  Language- follow 3 step command - 2 3  Language- read & follow direction - 1 1  Write a sentence - 1 1  Copy design - 0 1  Total score - 25 25        Immunization History  Administered Date(s) Administered  . Influenza Whole 03/08/2007, 02/13/2008, 02/15/2009, 12/31/2009  . Influenza, High Dose Seasonal PF 02/12/2014, 01/05/2018  . Influenza,inj,Quad PF,6+ Mos 03/13/2015, 12/23/2015  . Influenza-Unspecified 02/04/2013, 02/17/2017  . Pneumococcal Conjugate-13 06/13/2015  . Pneumococcal Polysaccharide-23 03/29/2013  . Td 01/07/2010   Screening Tests Health Maintenance  Topic Date Due  . OPHTHALMOLOGY EXAM  06/01/2018  . HEMOGLOBIN A1C  07/06/2018  . FOOT EXAM  06/15/2019  . TETANUS/TDAP  01/08/2020  . DEXA SCAN  06/01/2020  . INFLUENZA VACCINE  Completed  . PNA vac Low Risk Adult  Completed      Plan:     Reviewed health maintenance screenings with patient today and relevant education, vaccines, and/or referrals were provided.   Resource provided for [employer] to increase patient's socialization.  Order for [facility] and tub bench placed for patient's safety (at high risk for falls) and to maintain independence of ADLs  Continue doing brain stimulating activities (puzzles, reading, adult coloring books, staying active) to keep memory sharp.   Continue to eat heart healthy diet (full of fruits, vegetables, whole grains, lean protein, water--limit salt, fat, and sugar intake) and increase physical activity as tolerated.  I have personally reviewed and noted the following in the patient's chart:   . Medical and social history . Use of alcohol, tobacco or illicit drugs  . Current medications and supplements . Functional ability and status . Nutritional status . Physical activity . Advanced directives . List of other  physicians . Vitals . Screenings to include cognitive, depression, and falls . Referrals and appointments  In addition, I have reviewed and discussed with patient certain preventive protocols, quality metrics, and best practice recommendations. A written personalized care plan for preventive services as well as general preventive health recommendations were provided to patient.     Michiel Cowboy, RN  07/06/2018   Medical screening examination/treatment/procedure(s) were performed by non-physician practitioner and as supervising physician I was immediately available for consultation/collaboration. I agree with above. Binnie Rail, MD

## 2018-07-05 NOTE — Patient Instructions (Addendum)
  Tests ordered today. Your results will be released to MyChart (or called to you) after review, usually within 72hours after test completion. If any changes need to be made, you will be notified at that same time.  Medications reviewed and updated.  Changes include :   none      Please followup in 6 months   

## 2018-07-05 NOTE — Progress Notes (Signed)
Subjective:    Patient ID: Sheila Morgan, female    DOB: 1932/05/13, 85 y.o.   MRN: [id number]  HPI The patient is here for follow up.  She is here with her duaghter.   She sleeps well and is eating well.  She gets meals and wheels.    Diabetes: She is taking her medication daily as prescribed. She is compliant with a diabetic diet. She is not exercising regularly.  She is up-to-date with an ophthalmology examination.   Hypothyroidism:  She is taking her medication daily.  She denies any recent changes in energy or weight that are unexplained.   CAD, Hypertension: She is taking her medication daily. She is compliant with a low sodium diet.  She denies chest pain, palpitations, edema, shortness of breath and regular headaches. She is not exercising regularly.    Hyperlipidemia: She is taking her medication daily. She is compliant with a low fat/cholesterol diet. She is not exercising regularly.   Memory difficulties:  She did see Dr Jannifer Franklin and she did not want to start medication for her memory.    Chronic dizziness:  She did have a recent Ct scan and has evidence of prior infarcts which is likely the cause of her dizziness. She does use a cane and has not had any falls.     Medications and allergies reviewed with patient and updated if appropriate.  Patient Active Problem List   Diagnosis Date Noted  . Hearing loss 01/05/2018  . Memory difficulties 01/05/2018  . Multiple closed fractures of ribs of right side 01/19/2017  . Acute pain of right shoulder 01/05/2017  . Rib pain on right side 01/05/2017  . Acute pain of right knee 01/05/2017  . B12 deficiency 06/13/2015  . Hypothyroidism 06/13/2015  . DM (diabetes mellitus) type II, controlled, with peripheral vascular disorder ([place]) 03/06/2014  . Gout 10/15/2013  . Arthritis   . Hiatal hernia   . Coronary artery disease   . Left knee DJD   . Benign paroxysmal positional vertigo 05/19/2013  . Weakness generalized 05/09/2013    . Pseudogout of left knee 03/29/2013  . Acute right lumbar radiculopathy 03/18/2013  . Shoulder impingement syndrome 01/25/2013  . Vertigo 10/17/2012  . Renal insufficiency, mild 06/20/2011  . Essential hypertension 12/31/2009  . Osteoporosis 12/31/2009  . SKIN CANCER, HX OF 12/31/2009  . Proteinuria 01/05/2008  . UNSPECIFIED ANEMIA 12/21/2007  . Hyperlipidemia 09/08/2006  . Polymyalgia rheumatica ([place]) 09/08/2006  . CORONARY ARTERY BYPASS GRAFT, HX OF 09/08/2006    Current Outpatient Medications on File Prior to Visit  Medication Sig Dispense Refill  . alendronate (FOSAMAX) 70 MG tablet Take 70 mg by mouth once a week. Take with a full glass of water on an empty stomach.    Marland Kitchen amLODipine (NORVASC) 5 MG tablet TAKE 1 TABLET(5 MG) BY MOUTH DAILY 90 tablet 1  . aspirin 81 MG tablet Take 81 mg by mouth daily.    . Blood Glucose Monitoring Suppl (ONE TOUCH ULTRA MINI) W/DEVICE KIT Use to test blood sugar twice daily ICD 10 E11 21 1 each 0  . Calcium Carbonate-Vitamin D (CALCIUM 600-D PO) Take 1 tablet by mouth 2 (two) times daily.     . carvedilol (COREG) 25 MG tablet TAKE 1 TABLET(25 MG) BY MOUTH TWICE DAILY WITH A MEAL 180 tablet 1  . colchicine (COLCRYS) 0.6 MG tablet Take 1 tablet (0.6 mg total) by mouth every other day. 45 tablet 3  . furosemide (LASIX) 20  MG tablet TAKE 1 TABLET(20 MG) BY MOUTH DAILY (Patient taking differently: 20 mg. 1 every other day) 90 tablet 0  . furosemide (LASIX) 20 MG tablet TAKE 1 TABLET(20 MG) BY MOUTH DAILY 90 tablet 1  . glucose blood (ONE TOUCH ULTRA TEST) test strip Use to test blood sugar twice daily.. DX E11.51 200 each 12  . isosorbide mononitrate (IMDUR) 30 MG 24 hr tablet Take 1 tablet (30 mg total) by mouth daily. 90 tablet 3  . levothyroxine (SYNTHROID, LEVOTHROID) 50 MCG tablet TAKE 1 TABLET(50 MCG) BY MOUTH DAILY 90 tablet 0  . losartan (COZAAR) 100 MG tablet TAKE 1 TABLET(100 MG) BY MOUTH DAILY 90 tablet 1  . metFORMIN (GLUCOPHAGE) 500 MG  tablet TAKE 1/2 TABLET BY MOUTH DAILY WITH LARGEST MEAL 45 tablet 0  . ONETOUCH DELICA LANCETS 73Z MISC TEST BLOOD SUGAR TWICE DAILY. 200 each 1  . predniSONE (DELTASONE) 5 MG tablet Take 5 mg by mouth daily.    . simvastatin (ZOCOR) 40 MG tablet TAKE 1 TABLET(40 MG) BY MOUTH AT BEDTIME 90 tablet 0  . vitamin B-12 (CYANOCOBALAMIN) 1000 MCG tablet Take 1,000 mcg by mouth daily.       No current facility-administered medications on file prior to visit.     Past Medical History:  Diagnosis Date  . Arthritis   . Cancer ([place])    skin on nose .  mylenoma  . Coronary artery disease    status post post LAD angioplasty in 1993  and subsequent coronary artery bypass grafting x1 with a LIMA to the LAD July of 1994. She had stenting of her RCA with a bare-metal stent November 2002.  . Diabetes mellitus    2  . Gout   . Heart murmur   . Hematoma    Right groin  . Hemorrhoids   . Hiatal hernia   . History of blood transfusion   . HOH (hard of hearing)   . Hyperlipidemia   . Hypertension   . Left knee DJD   . Lightheadedness    has seen Dr Gwenlyn Found and ENT.. cant find out why  . Myocardial infarction ([place])   . Osteoporosis   . PONV (postoperative nausea and vomiting)   . Shingles 10/27/2013    Past Surgical History:  Procedure Laterality Date  . CORONARY ANGIOPLASTY  1993   LAD  . CORONARY ANGIOPLASTY WITH STENT PLACEMENT  03/09/01   RCA  . CORONARY ARTERY BYPASS GRAFT  1994  . JOINT REPLACEMENT Right 1998   hip  . NM MYOVIEW LTD  03/2012   Normal  . SKIN GRAFT     to nose skin cancer  . TOTAL HIP ARTHROPLASTY    . TOTAL KNEE ARTHROPLASTY Left 10/09/2013   Procedure: TOTAL KNEE ARTHROPLASTY;  Surgeon: Lorn Junes, MD;  Location: [facility];  Service: Orthopedics;  Laterality: Left;    Social History   Socioeconomic History  . Marital status: Widowed    Spouse name: Not on file  . Number of children: 2  . Years of education: Not on file  . Highest education level: 12th grade    Occupational History  . Occupation: Retired   Scientific laboratory technician  . Financial resource strain: Not hard at all  . Food insecurity:    Worry: Never true    Inability: Never true  . Transportation needs:    Medical: No    Non-medical: No  Tobacco Use  . Smoking status: Never Smoker  . Smokeless tobacco: Never  Used  Substance and Sexual Activity  . Alcohol use: No  . Drug use: No  . Sexual activity: Never  Lifestyle  . Physical activity:    Days per week: 0 days    Minutes per session: 0 min  . Stress: Not at all  Relationships  . Social connections:    Talks on phone: More than three times a week    Gets together: More than three times a week    Attends religious service: Not on file    Active member of club or organization: Not on file    Attends meetings of clubs or organizations: Not on file    Relationship status: Widowed  Other Topics Concern  . Not on file  Social History Narrative   Left handed    Caffeine 4 cups daily    Lives at home alone     Family History  Problem Relation Age of Onset  . Heart disease Father   . Cancer Sister        breast  . Diabetes Sister   . Heart disease Brother   . Heart disease Brother   . Heart disease Brother   . Diabetes Sister   . Stroke Mother     Review of Systems  Constitutional: Negative for chills and fever.  Respiratory: Positive for cough (sometimes at night when laying). Negative for shortness of breath and wheezing.   Cardiovascular: Negative for chest pain, palpitations and leg swelling.  Gastrointestinal: Negative for abdominal pain and nausea.  Neurological: Positive for dizziness. Negative for headaches.       Objective:   Vitals:   07/06/18 0812  BP: (!) 152/76  Pulse: 77  Resp: 16  Temp: 97.7 F (36.5 C)  SpO2: 99%   BP Readings from Last 3 Encounters:  07/06/18 (!) 152/76  06/09/18 138/82  01/05/18 (!) 158/90   Wt Readings from Last 3 Encounters:  07/06/18 158 lb 1.9 oz (71.7 kg)  06/09/18  156 lb 5 oz (70.9 kg)  01/05/18 154 lb (69.9 kg)   Body mass index is 24.77 kg/m.   Physical Exam    Constitutional: Appears well-developed and well-nourished. No distress.  HENT:  Head: Normocephalic and atraumatic.  Neck: Neck supple. No tracheal deviation present. No thyromegaly present.  No cervical lymphadenopathy Cardiovascular: Normal rate, regular rhythm and normal heart sounds.   No murmur heard. No carotid bruit .  No edema Pulmonary/Chest: Effort normal and breath sounds normal. No respiratory distress. No has no wheezes. No rales.  Skin: Skin is warm and dry. Not diaphoretic.  Psychiatric: Normal mood and affect. Behavior is normal.      Assessment & Plan:    See Problem List for Assessment and Plan of chronic medical problems.

## 2018-07-06 ENCOUNTER — Other Ambulatory Visit (INDEPENDENT_AMBULATORY_CARE_PROVIDER_SITE_OTHER): Payer: Medicare Other

## 2018-07-06 ENCOUNTER — Ambulatory Visit (INDEPENDENT_AMBULATORY_CARE_PROVIDER_SITE_OTHER): Payer: Medicare Other | Admitting: *Deleted

## 2018-07-06 ENCOUNTER — Encounter: Payer: Self-pay | Admitting: Internal Medicine

## 2018-07-06 ENCOUNTER — Telehealth: Payer: Self-pay | Admitting: *Deleted

## 2018-07-06 ENCOUNTER — Ambulatory Visit (INDEPENDENT_AMBULATORY_CARE_PROVIDER_SITE_OTHER): Payer: Medicare Other | Admitting: Internal Medicine

## 2018-07-06 VITALS — BP 152/76 | HR 77 | Temp 97.7°F | Resp 16 | Ht 67.0 in | Wt 158.1 lb

## 2018-07-06 VITALS — BP 152/76 | HR 77 | Ht 67.0 in | Wt 158.0 lb

## 2018-07-06 DIAGNOSIS — E038 Other specified hypothyroidism: Secondary | ICD-10-CM

## 2018-07-06 DIAGNOSIS — Z0000 Encounter for general adult medical examination without abnormal findings: Secondary | ICD-10-CM

## 2018-07-06 DIAGNOSIS — R413 Other amnesia: Secondary | ICD-10-CM

## 2018-07-06 DIAGNOSIS — I10 Essential (primary) hypertension: Secondary | ICD-10-CM

## 2018-07-06 DIAGNOSIS — I2510 Atherosclerotic heart disease of native coronary artery without angina pectoris: Secondary | ICD-10-CM | POA: Diagnosis not present

## 2018-07-06 DIAGNOSIS — I2583 Coronary atherosclerosis due to lipid rich plaque: Secondary | ICD-10-CM

## 2018-07-06 DIAGNOSIS — E1151 Type 2 diabetes mellitus with diabetic peripheral angiopathy without gangrene: Secondary | ICD-10-CM

## 2018-07-06 DIAGNOSIS — Z9181 History of falling: Secondary | ICD-10-CM

## 2018-07-06 DIAGNOSIS — E7849 Other hyperlipidemia: Secondary | ICD-10-CM

## 2018-07-06 DIAGNOSIS — R42 Dizziness and giddiness: Secondary | ICD-10-CM

## 2018-07-06 DIAGNOSIS — M810 Age-related osteoporosis without current pathological fracture: Secondary | ICD-10-CM | POA: Diagnosis not present

## 2018-07-06 LAB — TSH: TSH: 2.26 u[IU]/mL (ref 0.35–4.50)

## 2018-07-06 LAB — COMPREHENSIVE METABOLIC PANEL
ALT: 20 U/L (ref 0–35)
AST: 20 U/L (ref 0–37)
Albumin: 4.2 g/dL (ref 3.5–5.2)
Alkaline Phosphatase: 76 U/L (ref 39–117)
BUN: 16 mg/dL (ref 6–23)
CO2: 32 mEq/L (ref 19–32)
Calcium: 10.5 mg/dL (ref 8.4–10.5)
Chloride: 99 mEq/L (ref 96–112)
Creatinine, Ser: 1.14 mg/dL (ref 0.40–1.20)
GFR: 54.97 mL/min — AB (ref >60.00)
GLUCOSE: 147 mg/dL — AB (ref 70–99)
Potassium: 3.8 mEq/L (ref 3.5–5.1)
Sodium: 140 mEq/L (ref 135–145)
Total Bilirubin: 0.6 mg/dL (ref 0.2–1.2)
Total Protein: 7.2 g/dL (ref 6.0–8.3)

## 2018-07-06 LAB — LIPID PANEL
Cholesterol: 152 mg/dL (ref 0–200)
HDL: 69.9 mg/dL (ref >39.00)
LDL Cholesterol: 57 mg/dL (ref 0–99)
NonHDL: 82.59
Total CHOL/HDL Ratio: 2
Triglycerides: 127 mg/dL (ref 0.0–149.0)
VLDL: 25.4 mg/dL (ref 0.0–40.0)

## 2018-07-06 LAB — HEMOGLOBIN A1C: Hgb A1c MFr Bld: 7.2 % — ABNORMAL HIGH (ref 4.6–6.5)

## 2018-07-06 NOTE — Assessment & Plan Note (Signed)
Taking alendronate weekly

## 2018-07-06 NOTE — Assessment & Plan Note (Signed)
Chronic Likely related to old strokes

## 2018-07-06 NOTE — Assessment & Plan Note (Signed)
Denies chest pain, palpitations shortness of breath Continue current medications Lipid panel, CMP

## 2018-07-06 NOTE — Assessment & Plan Note (Signed)
Saw Dr. Jannifer Franklin and he confirmed memory disorder She did not want to take any medication and when asked today she stated she did not want to consider medication She does have a follow-up with him Encouraged increasing physical activity

## 2018-07-06 NOTE — Patient Instructions (Addendum)
If you cannot attend class in person, you can still exercise at home. Video taped versions of [employer] classes are shown on [employer] (GTN) at 8 am and 1 pm Mondays through Fridays. You can also purchase a copy of the [employer] DVD by calling [place] (GTN) [employer]. GTN is available on [employer] channel 13 with a digital cable box and on [employer] channel 31. GTN is also available on [employer], channel 99. To view GT[employer], go to channel 99, press OK, select [place], then select [employer] to start the channel.  Continue doing brain stimulating activities (puzzles, reading, adult coloring books, staying active) to keep memory sharp.   Continue to eat heart healthy diet (full of fruits, vegetables, whole grains, lean protein, water--limit salt, fat, and sugar intake) and increase physical activity as tolerated.   Sheila Morgan , Thank you for taking time to come for your Medicare Wellness Visit. I appreciate your ongoing commitment to your health goals. Please review the following plan we discussed and let me know if I can assist you in the future.   These are the goals we discussed: Goals    . patient     Exercise; discussed weight bearing; walking and using small weights to strengthen arms Walk, walk, walk      . Patient Stated     Set out 2-3 bottles of water daily, put it by my TV chair to remind me to drink. Stay as healthy and as independent as possible    . Patient Stated     Maintain current health status.       This is a list of the screening recommended for you and due dates:  Health Maintenance  Topic Date Due  . Eye exam for diabetics  06/01/2018  . Hemoglobin A1C  07/06/2018  . Complete foot exam   06/15/2019  . Tetanus Vaccine  01/08/2020  . DEXA scan (bone density measurement)  06/01/2020  . Flu Shot  Completed  . Pneumonia vaccines  Completed      It is important to avoid accidents which may result in broken bones.   Here are a few ideas on how to make your home safer so you will be less likely to trip or fall.  1. Use nonskid mats or non slip strips in your shower or tub, on your bathroom floor and around sinks.  If you know that you have spilled water, wipe it up! 2. In the bathroom, it is important to have properly installed grab bars on the walls or on the edge of the tub.  Towel racks are NOT strong enough for you to hold onto or to pull on for support. 3. Stairs and hallways should have enough light.  Add lamps or night lights if you need ore light. 4. It is good to have handrails on both sides of the stairs if possible.  Always fix broken handrails right away. 5. It is important to see the edges of steps.  Paint the edges of outdoor steps white so you can see them better.  Put colored tape on the edge of inside steps. 6. Throw-rugs are dangerous because they can slide.  Removing the rugs is the best idea, but if they must stay, add adhesive carpet tape to prevent slipping. 7. Do not keep things on stairs or in the halls.  Remove small furniture that blocks the halls as it may cause you to trip.  Keep telephone and electrical cords out of  the way where you walk. 8. Always were sturdy, rubber-soled shoes for good support.  Never wear just socks, especially on the stairs.  Socks may cause you to slip or fall.  Do not wear full-length housecoats as you can easily trip on the bottom.  9. Place the things you use the most on the shelves that are the easiest to reach.  If you use a stepstool, make sure it is in good condition.  If you feel unsteady, DO NOT climb, ask for help. 10. If a health professional advises you to use a cane or walker, do not be ashamed.  These items can keep you from falling and breaking your bones.

## 2018-07-06 NOTE — Assessment & Plan Note (Addendum)
BP fairly controlled-slightly elevated today Current regimen effective and well tolerated Continue current medications at current doses cmp

## 2018-07-06 NOTE — Assessment & Plan Note (Signed)
Taking metformin Very interactive, but fairly compliant with a diabetic diet Check A1c

## 2018-07-06 NOTE — Assessment & Plan Note (Signed)
Check TSH Will adjust medication dose if needed

## 2018-07-06 NOTE — Assessment & Plan Note (Signed)
Check lipid panel, CMP Encouraged increase activity-she is very sedentary Continue simvastatin

## 2018-07-06 NOTE — Telephone Encounter (Signed)
During AWV, daughter stated she would like PCP's opinion regarding patient taking tumeric. Daughter asked that PCP respond via [facility].

## 2018-07-06 NOTE — Telephone Encounter (Signed)
Message sent

## 2018-07-07 ENCOUNTER — Encounter: Payer: Self-pay | Admitting: Internal Medicine

## 2018-07-11 ENCOUNTER — Encounter: Payer: Medicare Other | Admitting: Psychology

## 2018-07-12 ENCOUNTER — Other Ambulatory Visit: Payer: Self-pay | Admitting: Internal Medicine

## 2018-07-14 ENCOUNTER — Encounter (HOSPITAL_COMMUNITY): Payer: Self-pay | Admitting: Emergency Medicine

## 2018-07-14 ENCOUNTER — Emergency Department (HOSPITAL_COMMUNITY): Payer: Medicare Other

## 2018-07-14 ENCOUNTER — Other Ambulatory Visit: Payer: Self-pay

## 2018-07-14 ENCOUNTER — Inpatient Hospital Stay (HOSPITAL_COMMUNITY)
Admission: EM | Admit: 2018-07-14 | Discharge: 2018-07-19 | DRG: 185 | Disposition: A | Discharge status: Skilled Nursing Facility | Payer: Medicare Other | Attending: Internal Medicine | Admitting: Internal Medicine

## 2018-07-14 ENCOUNTER — Encounter: Payer: Self-pay | Admitting: Podiatry

## 2018-07-14 ENCOUNTER — Encounter: Payer: Self-pay | Admitting: Internal Medicine

## 2018-07-14 DIAGNOSIS — Z7984 Long term (current) use of oral hypoglycemic drugs: Secondary | ICD-10-CM | POA: Diagnosis not present

## 2018-07-14 DIAGNOSIS — M2550 Pain in unspecified joint: Secondary | ICD-10-CM | POA: Diagnosis not present

## 2018-07-14 DIAGNOSIS — Z96649 Presence of unspecified artificial hip joint: Secondary | ICD-10-CM | POA: Diagnosis present

## 2018-07-14 DIAGNOSIS — I959 Hypotension, unspecified: Secondary | ICD-10-CM | POA: Diagnosis not present

## 2018-07-14 DIAGNOSIS — S0993XA Unspecified injury of face, initial encounter: Secondary | ICD-10-CM | POA: Diagnosis not present

## 2018-07-14 DIAGNOSIS — M1049 Other secondary gout, multiple sites: Secondary | ICD-10-CM | POA: Diagnosis not present

## 2018-07-14 DIAGNOSIS — Z96652 Presence of left artificial knee joint: Secondary | ICD-10-CM | POA: Diagnosis present

## 2018-07-14 DIAGNOSIS — Z8249 Family history of ischemic heart disease and other diseases of the circulatory system: Secondary | ICD-10-CM

## 2018-07-14 DIAGNOSIS — R509 Fever, unspecified: Secondary | ICD-10-CM

## 2018-07-14 DIAGNOSIS — M25512 Pain in left shoulder: Secondary | ICD-10-CM | POA: Diagnosis not present

## 2018-07-14 DIAGNOSIS — Z7952 Long term (current) use of systemic steroids: Secondary | ICD-10-CM

## 2018-07-14 DIAGNOSIS — Z951 Presence of aortocoronary bypass graft: Secondary | ICD-10-CM

## 2018-07-14 DIAGNOSIS — S4992XA Unspecified injury of left shoulder and upper arm, initial encounter: Secondary | ICD-10-CM | POA: Diagnosis not present

## 2018-07-14 DIAGNOSIS — Z833 Family history of diabetes mellitus: Secondary | ICD-10-CM | POA: Diagnosis not present

## 2018-07-14 DIAGNOSIS — H8193 Unspecified disorder of vestibular function, bilateral: Secondary | ICD-10-CM | POA: Diagnosis not present

## 2018-07-14 DIAGNOSIS — E039 Hypothyroidism, unspecified: Secondary | ICD-10-CM | POA: Diagnosis present

## 2018-07-14 DIAGNOSIS — S2241XD Multiple fractures of ribs, right side, subsequent encounter for fracture with routine healing: Secondary | ICD-10-CM | POA: Diagnosis not present

## 2018-07-14 DIAGNOSIS — Z955 Presence of coronary angioplasty implant and graft: Secondary | ICD-10-CM | POA: Diagnosis not present

## 2018-07-14 DIAGNOSIS — E7849 Other hyperlipidemia: Secondary | ICD-10-CM | POA: Diagnosis not present

## 2018-07-14 DIAGNOSIS — S2241XS Multiple fractures of ribs, right side, sequela: Secondary | ICD-10-CM | POA: Diagnosis not present

## 2018-07-14 DIAGNOSIS — H9193 Unspecified hearing loss, bilateral: Secondary | ICD-10-CM | POA: Diagnosis not present

## 2018-07-14 DIAGNOSIS — I10 Essential (primary) hypertension: Secondary | ICD-10-CM | POA: Diagnosis not present

## 2018-07-14 DIAGNOSIS — R1311 Dysphagia, oral phase: Secondary | ICD-10-CM | POA: Diagnosis not present

## 2018-07-14 DIAGNOSIS — Y92009 Unspecified place in unspecified non-institutional (private) residence as the place of occurrence of the external cause: Secondary | ICD-10-CM

## 2018-07-14 DIAGNOSIS — M109 Gout, unspecified: Secondary | ICD-10-CM | POA: Diagnosis present

## 2018-07-14 DIAGNOSIS — S2231XA Fracture of one rib, right side, initial encounter for closed fracture: Secondary | ICD-10-CM | POA: Diagnosis not present

## 2018-07-14 DIAGNOSIS — W010XXA Fall on same level from slipping, tripping and stumbling without subsequent striking against object, initial encounter: Secondary | ICD-10-CM | POA: Diagnosis present

## 2018-07-14 DIAGNOSIS — E1151 Type 2 diabetes mellitus with diabetic peripheral angiopathy without gangrene: Secondary | ICD-10-CM | POA: Diagnosis present

## 2018-07-14 DIAGNOSIS — R2689 Other abnormalities of gait and mobility: Secondary | ICD-10-CM | POA: Diagnosis not present

## 2018-07-14 DIAGNOSIS — Z823 Family history of stroke: Secondary | ICD-10-CM

## 2018-07-14 DIAGNOSIS — I2510 Atherosclerotic heart disease of native coronary artery without angina pectoris: Secondary | ICD-10-CM | POA: Diagnosis present

## 2018-07-14 DIAGNOSIS — Z85820 Personal history of malignant melanoma of skin: Secondary | ICD-10-CM | POA: Diagnosis not present

## 2018-07-14 DIAGNOSIS — R41841 Cognitive communication deficit: Secondary | ICD-10-CM | POA: Diagnosis not present

## 2018-07-14 DIAGNOSIS — E782 Mixed hyperlipidemia: Secondary | ICD-10-CM

## 2018-07-14 DIAGNOSIS — R011 Cardiac murmur, unspecified: Secondary | ICD-10-CM | POA: Diagnosis present

## 2018-07-14 DIAGNOSIS — M810 Age-related osteoporosis without current pathological fracture: Secondary | ICD-10-CM | POA: Diagnosis not present

## 2018-07-14 DIAGNOSIS — H9190 Unspecified hearing loss, unspecified ear: Secondary | ICD-10-CM | POA: Diagnosis present

## 2018-07-14 DIAGNOSIS — R42 Dizziness and giddiness: Secondary | ICD-10-CM | POA: Diagnosis not present

## 2018-07-14 DIAGNOSIS — W19XXXA Unspecified fall, initial encounter: Secondary | ICD-10-CM

## 2018-07-14 DIAGNOSIS — Z79890 Hormone replacement therapy (postmenopausal): Secondary | ICD-10-CM

## 2018-07-14 DIAGNOSIS — E119 Type 2 diabetes mellitus without complications: Secondary | ICD-10-CM | POA: Diagnosis not present

## 2018-07-14 DIAGNOSIS — S2231XS Fracture of one rib, right side, sequela: Secondary | ICD-10-CM | POA: Diagnosis not present

## 2018-07-14 DIAGNOSIS — M75102 Unspecified rotator cuff tear or rupture of left shoulder, not specified as traumatic: Secondary | ICD-10-CM | POA: Diagnosis present

## 2018-07-14 DIAGNOSIS — S0990XA Unspecified injury of head, initial encounter: Secondary | ICD-10-CM | POA: Diagnosis not present

## 2018-07-14 DIAGNOSIS — I25810 Atherosclerosis of coronary artery bypass graft(s) without angina pectoris: Secondary | ICD-10-CM | POA: Diagnosis not present

## 2018-07-14 DIAGNOSIS — S199XXA Unspecified injury of neck, initial encounter: Secondary | ICD-10-CM | POA: Diagnosis not present

## 2018-07-14 DIAGNOSIS — I252 Old myocardial infarction: Secondary | ICD-10-CM

## 2018-07-14 DIAGNOSIS — R52 Pain, unspecified: Secondary | ICD-10-CM | POA: Diagnosis not present

## 2018-07-14 DIAGNOSIS — R531 Weakness: Secondary | ICD-10-CM | POA: Diagnosis not present

## 2018-07-14 DIAGNOSIS — S2242XA Multiple fractures of ribs, left side, initial encounter for closed fracture: Secondary | ICD-10-CM | POA: Diagnosis not present

## 2018-07-14 DIAGNOSIS — W19XXXD Unspecified fall, subsequent encounter: Secondary | ICD-10-CM | POA: Diagnosis not present

## 2018-07-14 DIAGNOSIS — S2241XA Multiple fractures of ribs, right side, initial encounter for closed fracture: Principal | ICD-10-CM | POA: Diagnosis present

## 2018-07-14 DIAGNOSIS — I119 Hypertensive heart disease without heart failure: Secondary | ICD-10-CM | POA: Diagnosis present

## 2018-07-14 DIAGNOSIS — Z7982 Long term (current) use of aspirin: Secondary | ICD-10-CM | POA: Diagnosis not present

## 2018-07-14 DIAGNOSIS — R278 Other lack of coordination: Secondary | ICD-10-CM | POA: Diagnosis not present

## 2018-07-14 DIAGNOSIS — E785 Hyperlipidemia, unspecified: Secondary | ICD-10-CM | POA: Diagnosis present

## 2018-07-14 DIAGNOSIS — M353 Polymyalgia rheumatica: Secondary | ICD-10-CM | POA: Diagnosis not present

## 2018-07-14 DIAGNOSIS — R05 Cough: Secondary | ICD-10-CM | POA: Diagnosis not present

## 2018-07-14 DIAGNOSIS — Z79899 Other long term (current) drug therapy: Secondary | ICD-10-CM

## 2018-07-14 DIAGNOSIS — M6281 Muscle weakness (generalized): Secondary | ICD-10-CM | POA: Diagnosis not present

## 2018-07-14 DIAGNOSIS — Z7401 Bed confinement status: Secondary | ICD-10-CM | POA: Diagnosis not present

## 2018-07-14 LAB — CBC
HCT: 41.2 % (ref 36.0–46.0)
HCT: 36.7 % (ref 36.0–46.0)
Hemoglobin: 13.6 g/dL (ref 12.0–15.0)
Hemoglobin: 12.1 g/dL (ref 12.0–15.0)
MCH: 30 pg (ref 26.0–34.0)
MCH: 29.7 pg (ref 26.0–34.0)
MCHC: 33 g/dL (ref 30.0–36.0)
MCHC: 33 g/dL (ref 30.0–36.0)
MCV: 90.7 fL (ref 80.0–100.0)
MCV: 90.2 fL (ref 80.0–100.0)
Platelets: 148 10*3/uL — ABNORMAL LOW (ref 150–400)
Platelets: 150 K/uL (ref 150–400)
RBC: 4.54 MIL/uL (ref 3.87–5.11)
RBC: 4.07 MIL/uL (ref 3.87–5.11)
RDW: 13.8 % (ref 11.5–15.5)
RDW: 13.7 % (ref 11.5–15.5)
WBC: 13 10*3/uL — ABNORMAL HIGH (ref 4.0–10.5)
WBC: 10.2 K/uL (ref 4.0–10.5)
nRBC: 0 % (ref 0.0–0.2)
nRBC: 0 % (ref 0.0–0.2)

## 2018-07-14 LAB — URINALYSIS, ROUTINE W REFLEX MICROSCOPIC
BILIRUBIN URINE: NEGATIVE
Bacteria, UA: NONE SEEN
Glucose, UA: NEGATIVE mg/dL
Hgb urine dipstick: NEGATIVE
Ketones, ur: NEGATIVE mg/dL
Leukocytes,Ua: NEGATIVE
Nitrite: NEGATIVE
Protein, ur: 100 mg/dL — AB
Specific Gravity, Urine: 1.01 (ref 1.005–1.030)
pH: 7 (ref 5.0–8.0)

## 2018-07-14 LAB — CREATININE, SERUM
Creatinine, Ser: 1.07 mg/dL — ABNORMAL HIGH (ref 0.44–1.00)
GFR calc Af Amer: 56 mL/min — ABNORMAL LOW (ref >60)
GFR calc non Af Amer: 48 mL/min — ABNORMAL LOW (ref >60)

## 2018-07-14 LAB — BASIC METABOLIC PANEL
Anion gap: 10 (ref 5–15)
BUN: 12 mg/dL (ref 8–23)
CO2: 25 mmol/L (ref 22–32)
Calcium: 10.3 mg/dL (ref 8.9–10.3)
Chloride: 101 mmol/L (ref 98–111)
Creatinine, Ser: 1.14 mg/dL — ABNORMAL HIGH (ref 0.44–1.00)
GFR calc non Af Amer: 44 mL/min — ABNORMAL LOW (ref >60)
GFR, EST AFRICAN AMERICAN: 51 mL/min — AB (ref >60)
Glucose, Bld: 150 mg/dL — ABNORMAL HIGH (ref 70–99)
Potassium: 4.3 mmol/L (ref 3.5–5.1)
Sodium: 136 mmol/L (ref 135–145)

## 2018-07-14 LAB — GLUCOSE, CAPILLARY: Glucose-Capillary: 199 mg/dL — ABNORMAL HIGH (ref 70–99)

## 2018-07-14 LAB — INFLUENZA PANEL BY PCR (TYPE A & B)
Influenza A By PCR: NEGATIVE
Influenza B By PCR: NEGATIVE

## 2018-07-14 LAB — LACTIC ACID, PLASMA
Lactic Acid, Venous: 1.1 mmol/L (ref 0.5–1.9)
Lactic Acid, Venous: 1 mmol/L (ref 0.5–1.9)

## 2018-07-14 LAB — I-STAT TROPONIN, ED: Troponin i, poc: 0.04 ng/mL (ref 0.00–0.08)

## 2018-07-14 MED ORDER — SODIUM CHLORIDE 0.9 % IV SOLN
INTRAVENOUS | Status: DC
  Administered 2018-07-14: 23:00:00 via INTRAVENOUS

## 2018-07-14 MED ORDER — FUROSEMIDE 20 MG PO TABS
20.0000 mg | ORAL_TABLET | Freq: Every day | ORAL | Status: DC
  Administered 2018-07-14 – 2018-07-19 (×6): 20 mg via ORAL
  Filled 2018-07-14 (×6): qty 1

## 2018-07-14 MED ORDER — LEVOTHYROXINE SODIUM 50 MCG PO TABS
50.0000 ug | ORAL_TABLET | Freq: Every day | ORAL | Status: DC
  Administered 2018-07-15 – 2018-07-19 (×5): 50 ug via ORAL
  Filled 2018-07-14 (×6): qty 1

## 2018-07-14 MED ORDER — ALENDRONATE SODIUM 70 MG PO TABS: 70.0000 mg | ORAL_TABLET | ORAL | Status: DC

## 2018-07-14 MED ORDER — ACETAMINOPHEN 325 MG PO TABS
650.0000 mg | ORAL_TABLET | Freq: Once | ORAL | Status: AC
  Administered 2018-07-14: 650 mg via ORAL
  Filled 2018-07-14: qty 2

## 2018-07-14 MED ORDER — COLCHICINE 0.6 MG PO TABS
0.6000 mg | ORAL_TABLET | ORAL | Status: DC
  Administered 2018-07-15 – 2018-07-19 (×3): 0.6 mg via ORAL
  Filled 2018-07-14 (×5): qty 1

## 2018-07-14 MED ORDER — ASPIRIN EC 81 MG PO TBEC
81.0000 mg | DELAYED_RELEASE_TABLET | Freq: Every day | ORAL | Status: DC
  Administered 2018-07-14 – 2018-07-19 (×6): 81 mg via ORAL
  Filled 2018-07-14 (×6): qty 1

## 2018-07-14 MED ORDER — CARVEDILOL 25 MG PO TABS
25.0000 mg | ORAL_TABLET | Freq: Two times a day (BID) | ORAL | Status: DC
  Administered 2018-07-14 – 2018-07-19 (×10): 25 mg via ORAL
  Filled 2018-07-14 (×10): qty 1

## 2018-07-14 MED ORDER — ENOXAPARIN SODIUM 40 MG/0.4ML ~~LOC~~ SOLN
40.0000 mg | SUBCUTANEOUS | Status: DC
  Administered 2018-07-14 – 2018-07-18 (×5): 40 mg via SUBCUTANEOUS
  Filled 2018-07-14 (×5): qty 0.4

## 2018-07-14 MED ORDER — LOSARTAN POTASSIUM 50 MG PO TABS
100.0000 mg | ORAL_TABLET | Freq: Every day | ORAL | Status: DC
  Administered 2018-07-14 – 2018-07-19 (×6): 100 mg via ORAL
  Filled 2018-07-14 (×6): qty 2

## 2018-07-14 MED ORDER — CALCIUM CARBONATE-VITAMIN D 500-200 MG-UNIT PO TABS
1.0000 | ORAL_TABLET | Freq: Two times a day (BID) | ORAL | Status: DC
  Administered 2018-07-14 – 2018-07-19 (×10): 1 via ORAL
  Filled 2018-07-14 (×10): qty 1

## 2018-07-14 MED ORDER — ISOSORBIDE MONONITRATE ER 30 MG PO TB24
30.0000 mg | ORAL_TABLET | Freq: Every day | ORAL | Status: DC
  Administered 2018-07-14 – 2018-07-19 (×6): 30 mg via ORAL
  Filled 2018-07-14 (×6): qty 1

## 2018-07-14 MED ORDER — AMLODIPINE BESYLATE 5 MG PO TABS
5.0000 mg | ORAL_TABLET | Freq: Every day | ORAL | Status: DC
  Administered 2018-07-14 – 2018-07-19 (×6): 5 mg via ORAL
  Filled 2018-07-14 (×6): qty 1

## 2018-07-14 MED ORDER — ONDANSETRON HCL 4 MG PO TABS: 4.0000 mg | ORAL_TABLET | Freq: Four times a day (QID) | ORAL | Status: DC | PRN

## 2018-07-14 MED ORDER — PREDNISONE 5 MG PO TABS
5.0000 mg | ORAL_TABLET | Freq: Every day | ORAL | Status: DC
  Administered 2018-07-14 – 2018-07-19 (×6): 5 mg via ORAL
  Filled 2018-07-14 (×6): qty 1

## 2018-07-14 MED ORDER — INSULIN ASPART 100 UNIT/ML ~~LOC~~ SOLN
0.0000 [IU] | Freq: Three times a day (TID) | SUBCUTANEOUS | Status: DC
  Administered 2018-07-15: 2 [IU] via SUBCUTANEOUS
  Administered 2018-07-15: 1 [IU] via SUBCUTANEOUS
  Administered 2018-07-15: 2 [IU] via SUBCUTANEOUS
  Administered 2018-07-16: 3 [IU] via SUBCUTANEOUS
  Administered 2018-07-16: 2 [IU] via SUBCUTANEOUS
  Administered 2018-07-16 – 2018-07-17 (×2): 3 [IU] via SUBCUTANEOUS
  Administered 2018-07-17: 2 [IU] via SUBCUTANEOUS
  Administered 2018-07-18: 1 [IU] via SUBCUTANEOUS
  Administered 2018-07-18: 2 [IU] via SUBCUTANEOUS

## 2018-07-14 MED ORDER — ONDANSETRON HCL 4 MG/2ML IJ SOLN: 4.0000 mg | Freq: Four times a day (QID) | INTRAMUSCULAR | Status: DC | PRN

## 2018-07-14 MED ORDER — SODIUM CHLORIDE 0.9% FLUSH: 3.0000 mL | Freq: Once | INTRAVENOUS | Status: DC

## 2018-07-14 MED ORDER — INSULIN ASPART 100 UNIT/ML ~~LOC~~ SOLN: 0.0000 [IU] | Freq: Every day | SUBCUTANEOUS | Status: DC

## 2018-07-14 MED ORDER — SIMVASTATIN 20 MG PO TABS
40.0000 mg | ORAL_TABLET | Freq: Every day | ORAL | Status: DC
  Administered 2018-07-14 – 2018-07-19 (×6): 40 mg via ORAL
  Filled 2018-07-14 (×6): qty 2

## 2018-07-14 MED ORDER — VITAMIN B-12 1000 MCG PO TABS
1000.0000 ug | ORAL_TABLET | Freq: Every day | ORAL | Status: DC
  Administered 2018-07-15 – 2018-07-19 (×5): 1000 ug via ORAL
  Filled 2018-07-14 (×6): qty 1

## 2018-07-14 NOTE — ED Notes (Signed)
Patient transported to CT 

## 2018-07-14 NOTE — ED Provider Notes (Signed)
  Face-to-face evaluation   History: She presents for evaluation of fall.  He stated that she stood up from a chair, and fell.  He did her right chest.  She denies headache, neck pain or back pain at this time.  Physical exam: Elderly, alert, cooperative.  Heart regular rate and rhythm.  Lungs-clear anteriorly.  Heart regular rate and rhythm without murmur.  Chest tender right without crepitation or deformity.  Abdomen nontender.  Medical screening examination/treatment/procedure(s) were conducted as a shared visit with non-physician practitioner(s) and myself.  I personally evaluated the patient during the encounter    Daleen Bo, MD 07/17/18 1121

## 2018-07-14 NOTE — ED Notes (Signed)
Pt only able to ambulate to door and was dizzy with ambulation. Required 2x assist.

## 2018-07-14 NOTE — ED Notes (Signed)
ED TO INPATIENT HANDOFF REPORT  ED Nurse Name and Phone #: [phone] Magda Kiel Name/Age/Gender Sheila Morgan 85 y.o. female Room/Bed: 047C/047C  Code Status   Code Status: Prior  Home/SNF/Other Home Patient oriented to: self Is this baseline? Yes   Triage Complete: Triage complete  Chief Complaint L Should Pain  Triage Note Pt fell and couldn't get up, no complaints of pain with help. Pt has left should pain for 5 days, cardiac hx 10 years ago. Pt has steadily getting weaker in last few days. Pt AO x 4. Family states pt is "too slow" 150/80, HR 86, CBG 210.    Allergies No Known Allergies  Level of Care/Admitting Diagnosis ED Disposition    ED Disposition Condition [facility] Area: [place] [[id number]]  Level of Care: Med-Surg [16]  Diagnosis: Fall [[id number]]  Admitting Physician: Elwyn Reach [[id number]]  Attending Physician: Elwyn Reach [[id number]]  Estimated length of stay: past midnight tomorrow  Certification:: I certify this patient will need inpatient services for at least 2 midnights  PT Class (Do Not Modify): Inpatient [101]  PT Acc Code (Do Not Modify): Private [1]       B Medical/Surgery History Past Medical History:  Diagnosis Date  . Arthritis   . Cancer ([place])    skin on nose .  mylenoma  . Coronary artery disease    status post post LAD angioplasty in 1993  and subsequent coronary artery bypass grafting x1 with a LIMA to the LAD July of 1994. She had stenting of her RCA with a bare-metal stent November 2002.  . Diabetes mellitus    2  . Gout   . Heart murmur   . Hematoma    Right groin  . Hemorrhoids   . Hiatal hernia   . History of blood transfusion   . HOH (hard of hearing)   . Hyperlipidemia   . Hypertension   . Left knee DJD   . Lightheadedness    has seen Dr Gwenlyn Found and ENT.. cant find out why  . Myocardial infarction ([place])   . Osteoporosis   . PONV (postoperative nausea and vomiting)   . Shingles  10/27/2013   Past Surgical History:  Procedure Laterality Date  . CORONARY ANGIOPLASTY  1993   LAD  . CORONARY ANGIOPLASTY WITH STENT PLACEMENT  03/09/01   RCA  . CORONARY ARTERY BYPASS GRAFT  1994  . JOINT REPLACEMENT Right 1998   hip  . NM MYOVIEW LTD  03/2012   Normal  . SKIN GRAFT     to nose skin cancer  . TOTAL HIP ARTHROPLASTY    . TOTAL KNEE ARTHROPLASTY Left 10/09/2013   Procedure: TOTAL KNEE ARTHROPLASTY;  Surgeon: Lorn Junes, MD;  Location: [facility];  Service: Orthopedics;  Laterality: Left;     A IV Location/Drains/Wounds Patient Lines/Drains/Airways Status   Active Line/Drains/Airways    Name:   Placement date:   Placement time:   Site:   Days:   Peripheral IV 05/09/13 Right Antecubital   05/09/13    1551    Antecubital   1892   Peripheral IV 07/14/18 Right Forearm   07/14/18    1430    Forearm   less than 1   Incision (Closed) 10/09/13 Knee Left   10/09/13    0857     1739          Intake/Output Last 24 hours No intake or output data in  the 24 hours ending 07/14/18 2032  Labs/Imaging Results for orders placed or performed during the hospital encounter of 07/14/18 (from the past 48 hour(s))  Basic metabolic panel     Status: Abnormal   Collection Time: 07/14/18  2:23 PM  Result Value Ref Range   Sodium 136 135 - 145 mmol/L   Potassium 4.3 3.5 - 5.1 mmol/L    Comment: SLIGHT HEMOLYSIS   Chloride 101 98 - 111 mmol/L   CO2 25 22 - 32 mmol/L   Glucose, Bld 150 (H) 70 - 99 mg/dL   BUN 12 8 - 23 mg/dL   Creatinine, Ser 1.14 (H) 0.44 - 1.00 mg/dL   Calcium 10.3 8.9 - 10.3 mg/dL   GFR calc non Af Amer 44 (L) >60 mL/min   GFR calc Af Amer 51 (L) >60 mL/min   Anion gap 10 5 - 15    Comment: Performed at [facility], [address], [place], [place] [address]  CBC     Status: Abnormal   Collection Time: 07/14/18  2:23 PM  Result Value Ref Range   WBC 13.0 (H) 4.0 - 10.5 K/uL   RBC 4.54 3.87 - 5.11 MIL/uL   Hemoglobin 13.6 12.0 - 15.0 g/dL   HCT 41.2  36.0 - 46.0 %   MCV 90.7 80.0 - 100.0 fL   MCH 30.0 26.0 - 34.0 pg   MCHC 33.0 30.0 - 36.0 g/dL   RDW 13.8 11.5 - 15.5 %   Platelets 148 (L) 150 - 400 K/uL   nRBC 0.0 0.0 - 0.2 %    Comment: Performed at [facility], [place] [address] [place], [place] [address]  I-stat troponin, ED     Status: None   Collection Time: 07/14/18  2:38 PM  Result Value Ref Range   Troponin i, poc 0.04 0.00 - 0.08 ng/mL   Comment 3            Comment: Due to the release kinetics of cTnI, a negative result within the first hours of the onset of symptoms does not rule out myocardial infarction with certainty. If myocardial infarction is still suspected, repeat the test at appropriate intervals.   Urinalysis, Routine w reflex microscopic     Status: Abnormal   Collection Time: 07/14/18  3:10 PM  Result Value Ref Range   Color, Urine YELLOW YELLOW   APPearance CLEAR CLEAR   Specific Gravity, Urine 1.010 1.005 - 1.030   pH 7.0 5.0 - 8.0   Glucose, UA NEGATIVE NEGATIVE mg/dL   Hgb urine dipstick NEGATIVE NEGATIVE   Bilirubin Urine NEGATIVE NEGATIVE   Ketones, ur NEGATIVE NEGATIVE mg/dL   Protein, ur 100 (A) NEGATIVE mg/dL   Nitrite NEGATIVE NEGATIVE   Leukocytes,Ua NEGATIVE NEGATIVE   RBC / HPF 0-5 0 - 5 RBC/hpf   WBC, UA 0-5 0 - 5 WBC/hpf   Bacteria, UA NONE SEEN NONE SEEN   Squamous Epithelial / LPF 0-5 0 - 5    Comment: Performed at [facility], [place] [address], [place], [place] [address]  Lactic acid, plasma     Status: None   Collection Time: 07/14/18  5:00 PM  Result Value Ref Range   Lactic Acid, Venous 1.1 0.5 - 1.9 mmol/L    Comment: Performed at [facility] [address], [place], [place] [address]   Dg Ribs Unilateral W/chest Right  Result Date: 07/14/2018 CLINICAL DATA:  Diffuse right-sided chest and rib pain after  falling today. EXAM: RIGHT RIBS AND CHEST - 3+ VIEW COMPARISON:  05/16/2017 FINDINGS: Artifact overlies the chest. Previous median sternotomy.  Cardiomegaly. Chronic aortic atherosclerosis. Pulmonary vascularity is normal. The lungs are clear. No pneumothorax or hemothorax. Rib films with marker in the region of pain show old healed or healing fractures of the anterior seventh and eighth ribs. I think there is an acute nondisplaced fracture of the anterior ninth rib. IMPRESSION: No active cardiopulmonary disease. Acute nondisplaced fracture of the anterior right ninth rib. Healing fractures of the anterior right seventh and eighth ribs. Electronically Signed   By: Nelson Chimes M.D.   On: 07/14/2018 15:56   Ct Head Wo Contrast  Result Date: 07/14/2018 CLINICAL DATA:  85 year old female with head, face and neck injury following fall. Initial encounter. EXAM: CT HEAD WITHOUT CONTRAST CT MAXILLOFACIAL WITHOUT CONTRAST CT CERVICAL SPINE WITHOUT CONTRAST TECHNIQUE: Multidetector CT imaging of the head, cervical spine, and maxillofacial structures were performed using the standard protocol without intravenous contrast. Multiplanar CT image reconstructions of the cervical spine and maxillofacial structures were also generated. COMPARISON:  06/15/2018 CT, 07/21/2015 MR and 05/31/2006 cervical spine radiographs FINDINGS: CT HEAD FINDINGS Brain: No evidence of acute infarction, hemorrhage, hydrocephalus, extra-axial collection or mass lesion/mass effect. Atrophy, chronic small-vessel white matter ischemic changes and remote thalamic infarcts again noted. Vascular: Carotid atherosclerotic calcifications noted. Skull: Normal. Negative for fracture or focal lesion. Other: None. CT MAXILLOFACIAL FINDINGS Osseous: No fracture or mandibular dislocation. No destructive process. Orbits: Negative. No traumatic or inflammatory finding. Sinuses: Clear. Soft tissues: Negative. CT CERVICAL SPINE FINDINGS Alignment: Normal. Skull base and vertebrae: No acute fracture. No primary bone lesion or focal pathologic process. Soft tissues and spinal canal: No prevertebral fluid or  swelling. No visible canal hematoma. Disc levels: Mild-to-moderate multilevel degenerative disc disease, spondylosis and facet arthropathy again noted. Upper chest: No acute abnormality Other: None IMPRESSION: 1. No evidence of acute intracranial abnormality. Atrophy, chronic small-vessel white matter ischemic changes and remote thalamic infarcts. 2. No evidence of acute facial fracture. 3. No static evidence of acute injury to the cervical spine. Degenerative changes. Electronically Signed   By: Margarette Canada M.D.   On: 07/14/2018 15:46   Ct Cervical Spine Wo Contrast  Result Date: 07/14/2018 CLINICAL DATA:  85 year old female with head, face and neck injury following fall. Initial encounter. EXAM: CT HEAD WITHOUT CONTRAST CT MAXILLOFACIAL WITHOUT CONTRAST CT CERVICAL SPINE WITHOUT CONTRAST TECHNIQUE: Multidetector CT imaging of the head, cervical spine, and maxillofacial structures were performed using the standard protocol without intravenous contrast. Multiplanar CT image reconstructions of the cervical spine and maxillofacial structures were also generated. COMPARISON:  06/15/2018 CT, 07/21/2015 MR and 05/31/2006 cervical spine radiographs FINDINGS: CT HEAD FINDINGS Brain: No evidence of acute infarction, hemorrhage, hydrocephalus, extra-axial collection or mass lesion/mass effect. Atrophy, chronic small-vessel white matter ischemic changes and remote thalamic infarcts again noted. Vascular: Carotid atherosclerotic calcifications noted. Skull: Normal. Negative for fracture or focal lesion. Other: None. CT MAXILLOFACIAL FINDINGS Osseous: No fracture or mandibular dislocation. No destructive process. Orbits: Negative. No traumatic or inflammatory finding. Sinuses: Clear. Soft tissues: Negative. CT CERVICAL SPINE FINDINGS Alignment: Normal. Skull base and vertebrae: No acute fracture. No primary bone lesion or focal pathologic process. Soft tissues and spinal canal: No prevertebral fluid or swelling. No visible  canal hematoma. Disc levels: Mild-to-moderate multilevel degenerative disc disease, spondylosis and facet arthropathy again noted. Upper chest: No acute abnormality Other: None IMPRESSION: 1. No evidence of acute intracranial abnormality. Atrophy, chronic small-vessel white matter  ischemic changes and remote thalamic infarcts. 2. No evidence of acute facial fracture. 3. No static evidence of acute injury to the cervical spine. Degenerative changes. Electronically Signed   By: Margarette Canada M.D.   On: 07/14/2018 15:46   Dg Shoulder Left  Result Date: 07/14/2018 CLINICAL DATA:  [place] today.  Shoulder pain. EXAM: LEFT SHOULDER - 2+ VIEW COMPARISON:  None. FINDINGS: Chronic osteoarthritis of the glenohumeral joint. No fracture or dislocation. Narrowed humeral acromial distance likely indicating chronic rotator cuff tear. IMPRESSION: Chronic degenerative arthritis of the glenohumeral joint. Narrowed humeral acromial distance likely indicating chronic rotator cuff tear. Electronically Signed   By: Nelson Chimes M.D.   On: 07/14/2018 15:57   Ct Maxillofacial Wo Contrast  Result Date: 07/14/2018 CLINICAL DATA:  85 year old female with head, face and neck injury following fall. Initial encounter. EXAM: CT HEAD WITHOUT CONTRAST CT MAXILLOFACIAL WITHOUT CONTRAST CT CERVICAL SPINE WITHOUT CONTRAST TECHNIQUE: Multidetector CT imaging of the head, cervical spine, and maxillofacial structures were performed using the standard protocol without intravenous contrast. Multiplanar CT image reconstructions of the cervical spine and maxillofacial structures were also generated. COMPARISON:  06/15/2018 CT, 07/21/2015 MR and 05/31/2006 cervical spine radiographs FINDINGS: CT HEAD FINDINGS Brain: No evidence of acute infarction, hemorrhage, hydrocephalus, extra-axial collection or mass lesion/mass effect. Atrophy, chronic small-vessel white matter ischemic changes and remote thalamic infarcts again noted. Vascular: Carotid  atherosclerotic calcifications noted. Skull: Normal. Negative for fracture or focal lesion. Other: None. CT MAXILLOFACIAL FINDINGS Osseous: No fracture or mandibular dislocation. No destructive process. Orbits: Negative. No traumatic or inflammatory finding. Sinuses: Clear. Soft tissues: Negative. CT CERVICAL SPINE FINDINGS Alignment: Normal. Skull base and vertebrae: No acute fracture. No primary bone lesion or focal pathologic process. Soft tissues and spinal canal: No prevertebral fluid or swelling. No visible canal hematoma. Disc levels: Mild-to-moderate multilevel degenerative disc disease, spondylosis and facet arthropathy again noted. Upper chest: No acute abnormality Other: None IMPRESSION: 1. No evidence of acute intracranial abnormality. Atrophy, chronic small-vessel white matter ischemic changes and remote thalamic infarcts. 2. No evidence of acute facial fracture. 3. No static evidence of acute injury to the cervical spine. Degenerative changes. Electronically Signed   By: Margarette Canada M.D.   On: 07/14/2018 15:46    Pending Labs Unresulted Labs (From admission, onward)    Start     Ordered   07/14/18 1923  Influenza panel by PCR (type A & B)  (Influenza PCR Panel)  Once,   R     07/14/18 1923   07/14/18 1618  Lactic acid, plasma  Now then every 2 hours,   STAT     07/14/18 1617   07/14/18 1618  Blood culture (routine x 2)  BLOOD CULTURE X 2,   STAT     07/14/18 1617          Vitals/Pain Today's Vitals   07/14/18 1756 07/14/18 1756 07/14/18 1815 07/14/18 1830  BP:   139/87 (!) 151/96  Pulse:   68 87  Resp:   (!) 23 20  Temp:  98.1 F (36.7 C)    TempSrc:  Oral    SpO2:   96% 95%  Weight:      Height:      PainSc: 0-No pain       Isolation Precautions Droplet precaution  Medications Medications  sodium chloride flush (NS) 0.9 % injection 3 mL (3 mLs Intravenous Not Given 07/14/18 1431)  acetaminophen (TYLENOL) tablet 650 mg (650 mg Oral Given 07/14/18 1635)  Mobility walks with device Low fall risk   Focused Assessments Cardiac Assessment Handoff:  Cardiac Rhythm: Normal sinus rhythm with runs of PVCs and came in with Atrial Flutter No results found for: CKTOTAL, CKMB, CKMBINDEX, TROPONINI No results found for: DDIMER Does the Patient currently have chest pain?NO     R Recommendations: See Admitting Provider Note  Report given to:   Additional Notes:   Pt has had shoulder pain for 1 week. Came today for symptoms of fall with fatigue. DG shows broken rib on right side.

## 2018-07-14 NOTE — ED Notes (Signed)
Attempted report x 2 

## 2018-07-14 NOTE — H&P (Signed)
History and Physical   Sheila Morgan HUD:[id number] DOB: 03-06-1933 DOA: 07/14/2018  Referring MD/NP/PA: Dr. Daleen Bo  PCP: Binnie Rail, MD   Outpatient Specialists: None  Patient coming from: Home  Chief Complaint: Fall with right chest pain  HPI: Sheila Morgan is a 85 y.o. female with medical history significant of coronary artery disease, osteoarthritis, hypertension, osteoporosis, diabetes, gout, hard of hearing,Right shoulder radiculopathy, and PMR who was brought in to the ER today after sustaining a fall at home.  Patient says she was sitting on her couch and try to get up but slid down trying to get herself off but laid down on the floor.  She hit her face and right side of her chest.  Daughter tried to call her several times did not get through to her which was unusual.  She went to the house and found the mother on the floor.  She continues to have pain in her right rib cage.  Also weak.  She however is still able to communicate.  She fully understands was going on around her.  Patient was seen and evaluated in the ER appears to have a rib fracture.  After evaluation patient was unable to walk.  She required 2 people to even get out of bed.  She lives alone and therefore not able to go back home to function.  Patient is being admitted to the hospital therefore for evaluation for the..  ED Course: Temperature 100.6 blood pressure initially 78/61 but now 177/86 pulse 97 respiratory rate of 25 oxygen sat 95% room air.  Creatinine 1.14 with platelets 148 and white count of 13.  Glucose is 150 lactic acid 1.1.  X-ray of the chest shows acute fracture of the ninth rib on the right with healing fracture of the seventh and eighth ribs, x-ray of the left shoulder showed chronic rotator cuff tear with positive arthritis.  Head CT without contrast CT maxillofacial and CT cervical spine were negative.  Urinalysis is negative.  Patient is being admitted for further work-up.  Review of  Systems: As per HPI otherwise 10 point review of systems negative.    Past Medical History:  Diagnosis Date   Arthritis    Cancer ([place])    skin on nose .  mylenoma   Coronary artery disease    status post post LAD angioplasty in 1993  and subsequent coronary artery bypass grafting x1 with a LIMA to the LAD July of 1994. She had stenting of her RCA with a bare-metal stent November 2002.   Diabetes mellitus    2   Gout    Heart murmur    Hematoma    Right groin   Hemorrhoids    Hiatal hernia    History of blood transfusion    HOH (hard of hearing)    Hyperlipidemia    Hypertension    Left knee DJD    Lightheadedness    has seen Dr Gwenlyn Found and ENT.. cant find out why   Myocardial infarction [facility])    Osteoporosis    PONV (postoperative nausea and vomiting)    Shingles 10/27/2013    Past Surgical History:  Procedure Laterality Date   CORONARY ANGIOPLASTY  1993   LAD   CORONARY ANGIOPLASTY WITH STENT PLACEMENT  03/09/01   RCA   CORONARY ARTERY BYPASS GRAFT  1994   JOINT REPLACEMENT Right 1998   hip   NM MYOVIEW LTD  03/2012   Normal   SKIN GRAFT  to nose skin cancer   TOTAL HIP ARTHROPLASTY     TOTAL KNEE ARTHROPLASTY Left 10/09/2013   Procedure: TOTAL KNEE ARTHROPLASTY;  Surgeon: Lorn Junes, MD;  Location: [place];  Service: Orthopedics;  Laterality: Left;     reports that she has never smoked. She has never used smokeless tobacco. She reports that she does not drink alcohol or use drugs.  No Known Allergies  Family History  Problem Relation Age of Onset   Heart disease Father    Cancer Sister        breast   Diabetes Sister    Heart disease Brother    Heart disease Brother    Heart disease Brother    Diabetes Sister    Stroke Mother      Prior to Admission medications   Medication Sig Start Date End Date Taking? Authorizing Provider  alendronate (FOSAMAX) 70 MG tablet Take 70 mg by mouth every Saturday. Take with a  full glass of water on an empty stomach.    Yes [provider]  amLODipine (NORVASC) 5 MG tablet TAKE 1 TABLET(5 MG) BY MOUTH DAILY Patient taking differently: Take 5 mg by mouth daily.  06/01/18  Yes Burns, Claudina Lick, MD  aspirin 81 MG tablet Take 81 mg by mouth daily.   Yes [provider]  Calcium Carbonate-Vitamin D (CALCIUM 600-D PO) Take 1 tablet by mouth 2 (two) times daily.    Yes [provider]  carvedilol (COREG) 25 MG tablet TAKE 1 TABLET(25 MG) BY MOUTH TWICE DAILY WITH A MEAL Patient taking differently: Take 25 mg by mouth 2 (two) times daily with a meal.  04/20/18  Yes Burns, Claudina Lick, MD  colchicine (COLCRYS) 0.6 MG tablet Take 1 tablet (0.6 mg total) by mouth every other day. 05/02/18  Yes Burns, Claudina Lick, MD  furosemide (LASIX) 20 MG tablet TAKE 1 TABLET(20 MG) BY MOUTH DAILY Patient taking differently: Take 20 mg by mouth daily.  06/13/18  Yes Burns, Claudina Lick, MD  isosorbide mononitrate (IMDUR) 30 MG 24 hr tablet Take 1 tablet (30 mg total) by mouth daily. 07/02/17  Yes Burns, Claudina Lick, MD  levothyroxine (SYNTHROID, LEVOTHROID) 50 MCG tablet TAKE 1 TABLET(50 MCG) BY MOUTH DAILY Patient taking differently: Take 50 mcg by mouth daily.  07/02/18  Yes Burns, Claudina Lick, MD  losartan (COZAAR) 100 MG tablet TAKE 1 TABLET(100 MG) BY MOUTH DAILY Patient taking differently: Take 100 mg by mouth daily.  01/24/18  Yes Burns, Claudina Lick, MD  metFORMIN (GLUCOPHAGE) 500 MG tablet TAKE 1/2 TABLET BY MOUTH DAILY WITH LARGEST MEAL Patient taking differently: Take 250 mg by mouth daily. Take 1/2 tablet by mouth daily with largest meal 05/19/18  Yes Burns, Claudina Lick, MD  predniSONE (DELTASONE) 5 MG tablet Take 5 mg by mouth daily.   Yes [provider]  simvastatin (ZOCOR) 40 MG tablet TAKE 1 TABLET(40 MG) BY MOUTH AT BEDTIME Patient taking differently: Take 40 mg by mouth daily.  05/09/18  Yes Lorretta Harp, MD  vitamin B-12 (CYANOCOBALAMIN) 1000 MCG tablet Take 1,000 mcg by  mouth daily.     Yes [provider]  Blood Glucose Monitoring Suppl (ONE TOUCH ULTRA MINI) W/DEVICE KIT Use to test blood sugar twice daily ICD 10 E11 21 08/20/14   Hendricks Limes, MD  furosemide (LASIX) 20 MG tablet TAKE 1 TABLET(20 MG) BY MOUTH DAILY Patient not taking: No sig reported 04/04/18   Binnie Rail, MD  glucose  blood (ONE TOUCH ULTRA TEST) test strip Use to test blood sugar twice daily.. DX E11.51 07/28/17   Binnie Rail, MD  OneTouch Delica Lancets 38G MISC TEST BLOOD SUGAR TWICE DAILY 07/12/18   Binnie Rail, MD    Physical Exam: Vitals:   07/14/18 2030 07/14/18 2045 07/14/18 2100 07/14/18 2134  BP: 130/61 137/71 (!) 143/68 (!) 161/64  Pulse: 68 82 77 60  Resp: 18 18 (!) 25 16  Temp:    98.9 F (37.2 C)  TempSrc:    Oral  SpO2: 98% 97% 96% 98%  Weight:      Height:          Constitutional: NAD, calm, in pain Vitals:   07/14/18 2030 07/14/18 2045 07/14/18 2100 07/14/18 2134  BP: 130/61 137/71 (!) 143/68 (!) 161/64  Pulse: 68 82 77 60  Resp: 18 18 (!) 25 16  Temp:    98.9 F (37.2 C)  TempSrc:    Oral  SpO2: 98% 97% 96% 98%  Weight:      Height:       Eyes: PERRL, lids and conjunctivae normal ENMT: Mucous membranes are moist. Posterior pharynx clear of any exudate or lesions.Normal dentition.  Neck: normal, supple, no masses, no thyromegaly Respiratory: Tenderness over the right rib cage, clear to auscultation bilaterally, no wheezing, no crackles. Normal respiratory effort. No accessory muscle use.  Cardiovascular: Regular rate and rhythm, no murmurs / rubs / gallops. No extremity edema. 2+ pedal pulses. No carotid bruits.  Abdomen: no tenderness, no masses palpated. No hepatosplenomegaly. Bowel sounds positive.  Musculoskeletal: Left shoulder decreased motion, no clubbing / cyanosis. No joint deformity upper and lower extremities. Good ROM, no contractures. Normal muscle tone.  Skin: no rashes, lesions, ulcers. No induration Neurologic: CN  2-12 grossly intact. Sensation intact, DTR normal. Strength 5/5 in all 4.  Psychiatric: Normal judgment and insight. Alert and oriented x 3. Normal mood.     Labs on Admission: I have personally reviewed following labs and imaging studies  CBC: Recent Labs  Lab 07/14/18 1423  WBC 13.0*  HGB 13.6  HCT 41.2  MCV 90.7  PLT 536*   Basic Metabolic Panel: Recent Labs  Lab 07/14/18 1423  NA 136  K 4.3  CL 101  CO2 25  GLUCOSE 150*  BUN 12  CREATININE 1.14*  CALCIUM 10.3   GFR: Estimated Creatinine Clearance: 37.1 mL/min (A) (by C-G formula based on SCr of 1.14 mg/dL (H)). Liver Function Tests: No results for input(s): AST, ALT, ALKPHOS, BILITOT, PROT, ALBUMIN in the last 168 hours. No results for input(s): LIPASE, AMYLASE in the last 168 hours. No results for input(s): AMMONIA in the last 168 hours. Coagulation Profile: No results for input(s): INR, PROTIME in the last 168 hours. Cardiac Enzymes: No results for input(s): CKTOTAL, CKMB, CKMBINDEX, TROPONINI in the last 168 hours. BNP (last 3 results) No results for input(s): PROBNP in the last 8760 hours. HbA1C: No results for input(s): HGBA1C in the last 72 hours. CBG: Recent Labs  Lab 07/14/18 2146  GLUCAP 199*   Lipid Profile: No results for input(s): CHOL, HDL, LDLCALC, TRIG, CHOLHDL, LDLDIRECT in the last 72 hours. Thyroid Function Tests: No results for input(s): TSH, T4TOTAL, FREET4, T3FREE, THYROIDAB in the last 72 hours. Anemia Panel: No results for input(s): VITAMINB12, FOLATE, FERRITIN, TIBC, IRON, RETICCTPCT in the last 72 hours. Urine analysis:    Component Value Date/Time   COLORURINE YELLOW 07/14/2018 1510   APPEARANCEUR CLEAR 07/14/2018 1510  LABSPEC 1.010 07/14/2018 1510   PHURINE 7.0 07/14/2018 1510   GLUCOSEU NEGATIVE 07/14/2018 1510   HGBUR NEGATIVE 07/14/2018 1510   BILIRUBINUR NEGATIVE 07/14/2018 1510   KETONESUR NEGATIVE 07/14/2018 1510   PROTEINUR 100 (A) 07/14/2018 1510    UROBILINOGEN 0.2 11/04/2017 1221   NITRITE NEGATIVE 07/14/2018 1510   LEUKOCYTESUR NEGATIVE 07/14/2018 1510   Sepsis Labs: @LABRCNTIP (procalcitonin:4,lacticidven:4) )No results found for this or any previous visit (from the past 240 hour(s)).   Radiological Exams on Admission: Dg Ribs Unilateral W/chest Right  Result Date: 07/14/2018 CLINICAL DATA:  Diffuse right-sided chest and rib pain after falling today. EXAM: RIGHT RIBS AND CHEST - 3+ VIEW COMPARISON:  05/16/2017 FINDINGS: Artifact overlies the chest. Previous median sternotomy. Cardiomegaly. Chronic aortic atherosclerosis. Pulmonary vascularity is normal. The lungs are clear. No pneumothorax or hemothorax. Rib films with marker in the region of pain show old healed or healing fractures of the anterior seventh and eighth ribs. I think there is an acute nondisplaced fracture of the anterior ninth rib. IMPRESSION: No active cardiopulmonary disease. Acute nondisplaced fracture of the anterior right ninth rib. Healing fractures of the anterior right seventh and eighth ribs. Electronically Signed   By: Nelson Chimes M.D.   On: 07/14/2018 15:56   Ct Head Wo Contrast  Result Date: 07/14/2018 CLINICAL DATA:  85 year old female with head, face and neck injury following fall. Initial encounter. EXAM: CT HEAD WITHOUT CONTRAST CT MAXILLOFACIAL WITHOUT CONTRAST CT CERVICAL SPINE WITHOUT CONTRAST TECHNIQUE: Multidetector CT imaging of the head, cervical spine, and maxillofacial structures were performed using the standard protocol without intravenous contrast. Multiplanar CT image reconstructions of the cervical spine and maxillofacial structures were also generated. COMPARISON:  06/15/2018 CT, 07/21/2015 MR and 05/31/2006 cervical spine radiographs FINDINGS: CT HEAD FINDINGS Brain: No evidence of acute infarction, hemorrhage, hydrocephalus, extra-axial collection or mass lesion/mass effect. Atrophy, chronic small-vessel white matter ischemic changes and remote  thalamic infarcts again noted. Vascular: Carotid atherosclerotic calcifications noted. Skull: Normal. Negative for fracture or focal lesion. Other: None. CT MAXILLOFACIAL FINDINGS Osseous: No fracture or mandibular dislocation. No destructive process. Orbits: Negative. No traumatic or inflammatory finding. Sinuses: Clear. Soft tissues: Negative. CT CERVICAL SPINE FINDINGS Alignment: Normal. Skull base and vertebrae: No acute fracture. No primary bone lesion or focal pathologic process. Soft tissues and spinal canal: No prevertebral fluid or swelling. No visible canal hematoma. Disc levels: Mild-to-moderate multilevel degenerative disc disease, spondylosis and facet arthropathy again noted. Upper chest: No acute abnormality Other: None IMPRESSION: 1. No evidence of acute intracranial abnormality. Atrophy, chronic small-vessel white matter ischemic changes and remote thalamic infarcts. 2. No evidence of acute facial fracture. 3. No static evidence of acute injury to the cervical spine. Degenerative changes. Electronically Signed   By: Margarette Canada M.D.   On: 07/14/2018 15:46   Ct Cervical Spine Wo Contrast  Result Date: 07/14/2018 CLINICAL DATA:  85 year old female with head, face and neck injury following fall. Initial encounter. EXAM: CT HEAD WITHOUT CONTRAST CT MAXILLOFACIAL WITHOUT CONTRAST CT CERVICAL SPINE WITHOUT CONTRAST TECHNIQUE: Multidetector CT imaging of the head, cervical spine, and maxillofacial structures were performed using the standard protocol without intravenous contrast. Multiplanar CT image reconstructions of the cervical spine and maxillofacial structures were also generated. COMPARISON:  06/15/2018 CT, 07/21/2015 MR and 05/31/2006 cervical spine radiographs FINDINGS: CT HEAD FINDINGS Brain: No evidence of acute infarction, hemorrhage, hydrocephalus, extra-axial collection or mass lesion/mass effect. Atrophy, chronic small-vessel white matter ischemic changes and remote thalamic infarcts  again noted. Vascular: Carotid atherosclerotic calcifications  noted. Skull: Normal. Negative for fracture or focal lesion. Other: None. CT MAXILLOFACIAL FINDINGS Osseous: No fracture or mandibular dislocation. No destructive process. Orbits: Negative. No traumatic or inflammatory finding. Sinuses: Clear. Soft tissues: Negative. CT CERVICAL SPINE FINDINGS Alignment: Normal. Skull base and vertebrae: No acute fracture. No primary bone lesion or focal pathologic process. Soft tissues and spinal canal: No prevertebral fluid or swelling. No visible canal hematoma. Disc levels: Mild-to-moderate multilevel degenerative disc disease, spondylosis and facet arthropathy again noted. Upper chest: No acute abnormality Other: None IMPRESSION: 1. No evidence of acute intracranial abnormality. Atrophy, chronic small-vessel white matter ischemic changes and remote thalamic infarcts. 2. No evidence of acute facial fracture. 3. No static evidence of acute injury to the cervical spine. Degenerative changes. Electronically Signed   By: Margarette Canada M.D.   On: 07/14/2018 15:46   Dg Shoulder Left  Result Date: 07/14/2018 CLINICAL DATA:  [place] today.  Shoulder pain. EXAM: LEFT SHOULDER - 2+ VIEW COMPARISON:  None. FINDINGS: Chronic osteoarthritis of the glenohumeral joint. No fracture or dislocation. Narrowed humeral acromial distance likely indicating chronic rotator cuff tear. IMPRESSION: Chronic degenerative arthritis of the glenohumeral joint. Narrowed humeral acromial distance likely indicating chronic rotator cuff tear. Electronically Signed   By: Nelson Chimes M.D.   On: 07/14/2018 15:57   Ct Maxillofacial Wo Contrast  Result Date: 07/14/2018 CLINICAL DATA:  85 year old female with head, face and neck injury following fall. Initial encounter. EXAM: CT HEAD WITHOUT CONTRAST CT MAXILLOFACIAL WITHOUT CONTRAST CT CERVICAL SPINE WITHOUT CONTRAST TECHNIQUE: Multidetector CT imaging of the head, cervical spine, and maxillofacial  structures were performed using the standard protocol without intravenous contrast. Multiplanar CT image reconstructions of the cervical spine and maxillofacial structures were also generated. COMPARISON:  06/15/2018 CT, 07/21/2015 MR and 05/31/2006 cervical spine radiographs FINDINGS: CT HEAD FINDINGS Brain: No evidence of acute infarction, hemorrhage, hydrocephalus, extra-axial collection or mass lesion/mass effect. Atrophy, chronic small-vessel white matter ischemic changes and remote thalamic infarcts again noted. Vascular: Carotid atherosclerotic calcifications noted. Skull: Normal. Negative for fracture or focal lesion. Other: None. CT MAXILLOFACIAL FINDINGS Osseous: No fracture or mandibular dislocation. No destructive process. Orbits: Negative. No traumatic or inflammatory finding. Sinuses: Clear. Soft tissues: Negative. CT CERVICAL SPINE FINDINGS Alignment: Normal. Skull base and vertebrae: No acute fracture. No primary bone lesion or focal pathologic process. Soft tissues and spinal canal: No prevertebral fluid or swelling. No visible canal hematoma. Disc levels: Mild-to-moderate multilevel degenerative disc disease, spondylosis and facet arthropathy again noted. Upper chest: No acute abnormality Other: None IMPRESSION: 1. No evidence of acute intracranial abnormality. Atrophy, chronic small-vessel white matter ischemic changes and remote thalamic infarcts. 2. No evidence of acute facial fracture. 3. No static evidence of acute injury to the cervical spine. Degenerative changes. Electronically Signed   By: Margarette Canada M.D.   On: 07/14/2018 15:46    EKG: Independently reviewed.  It shows a flutter with 3-1 block.  No ST changes  Assessment/Plan Principal Problem:   Fall Active Problems:   Hyperlipidemia   Essential hypertension   CORONARY ARTERY BYPASS GRAFT, HX OF   Weakness generalized   DM (diabetes mellitus) type II, controlled, with peripheral vascular disorder (HCC)   Hypothyroidism    Multiple closed fractures of ribs of right side   Hearing loss   Fever, unspecified     #1 status post fall: Appears to be mechanical.  Patient denied losing consciousness.  She however has remote history of vertigo as well as multiple cardiac reasons.  At  this point we will admit the patient and put her on telemetry.  Observe the patient.  PT as well as OT consultation.  She is requiring 2 people to assist her.  Patient may end of going to skilled facility if she does not turn around quickly.  #2 multiple rib fractures: Pain control and incentive spirometry will be deployed.  #3 hypertension: Continue blood pressure medications from home.  #4 diabetes: continue home regimen with sliding scale insulin.  #5 hypothyroidism: Continue levothyroxine.  #6 history of coronary artery disease: Stable at this point.  Continue monitoring.  #7 hyperlipidemia: Continue with statin.  DVT prophylaxis: Lovenox  Code Status: Full code Family Communication: Daughter at bedside Disposition Plan: Possible skilled nursing facility Consults called: None Admission status: Inpatient  Severity of Illness: The appropriate patient status for this patient is INPATIENT. Inpatient status is judged to be reasonable and necessary in order to provide the required intensity of service to ensure the patient's safety. The patient's presenting symptoms, physical exam findings, and initial radiographic and laboratory data in the context of their chronic comorbidities is felt to place them at high risk for further clinical deterioration. Furthermore, it is not anticipated that the patient will be medically stable for discharge from the hospital within 2 midnights of admission. The following factors support the patient status of inpatient.   " The patient's presenting symptoms include fall. " The worrisome physical exam findings include tenderness in the right rib cage. " The initial radiographic and laboratory data are  worrisome because of x-ray showed multiple rib fractures. " The chronic co-morbidities include diabetes with hypothyroidism.   * I certify that at the point of admission it is my clinical judgment that the patient will require inpatient hospital care spanning beyond 2 midnights from the point of admission due to high intensity of service, high risk for further deterioration and high frequency of surveillance required.Barbette Merino MD [facility] Pager [phone]  If 7PM-7AM, please contact night-coverage [email or web address] Password [email or web address]  07/14/2018, 10:14 PM

## 2018-07-14 NOTE — ED Provider Notes (Signed)
[facility] EMERGENCY DEPARTMENT Provider Note   CSN: [id number] Arrival date & time: 07/14/18  1407    History   Chief Complaint Chief Complaint  Patient presents with   Shoulder Pain    HPI Sheila Morgan is a 85 y.o. female.     HPI   Patient is an 85 year old female with history of cancer, CAD, diabetes, heart murmur, hyperlipidemia, hypertension, MI, who presents the emergency department today for evaluation after a fall.  Patient states that she got up to open her blinds earlier today and lost her balance falling forward onto her chest.  States she tried to get herself with her hands prior to this.  She did hit her chin on the ground.  She did not lose consciousness.  States she fell around 1030 or 11:00 and her daughter checked on her around noon and helps her up.  Daughter states that she seems generally weaker today.  States that she has some confusion at baseline that she thinks is somewhat worse today.  States that patient was initially confused on what time she fell which is not normal for her.  Denies any slurred speech or other changes in mental status.  Of note, patient complaining of left shoulder pain that she states started about 5 days ago.  Located to the posterior aspect of her left shoulder there.  Pain is mild when she is not moving it however it is much worse when she tries to move her left upper extremity.  She denies chest pain.  She denies shortness of breath.  She has had a mild cough.  No rhinorrhea, congestion, sore throat or other symptoms.  She denies headache, vomiting, unilateral numbness/weakness.  Past Medical History:  Diagnosis Date   Arthritis    Cancer ([place])    skin on nose .  mylenoma   Coronary artery disease    status post post LAD angioplasty in 1993  and subsequent coronary artery bypass grafting x1 with a LIMA to the LAD July of 1994. She had stenting of her RCA with a bare-metal stent November 2002.   Diabetes  mellitus    2   Gout    Heart murmur    Hematoma    Right groin   Hemorrhoids    Hiatal hernia    History of blood transfusion    HOH (hard of hearing)    Hyperlipidemia    Hypertension    Left knee DJD    Lightheadedness    has seen Dr Gwenlyn Found and ENT.. cant find out why   Myocardial infarction [facility])    Osteoporosis    PONV (postoperative nausea and vomiting)    Shingles 10/27/2013    Patient Active Problem List   Diagnosis Date Noted   Fall 07/14/2018   Hearing loss 01/05/2018   Memory difficulties 01/05/2018   Multiple closed fractures of ribs of right side 01/19/2017   Acute pain of right shoulder 01/05/2017   Rib pain on right side 01/05/2017   Acute pain of right knee 01/05/2017   B12 deficiency 06/13/2015   Hypothyroidism 06/13/2015   DM (diabetes mellitus) type II, controlled, with peripheral vascular disorder ([place]) 03/06/2014   Gout 10/15/2013   Arthritis    Hiatal hernia    Coronary artery disease    Left knee DJD    Benign paroxysmal positional vertigo 05/19/2013   Weakness generalized 05/09/2013   Pseudogout of left knee 03/29/2013   Acute right lumbar radiculopathy 03/18/2013   Shoulder  impingement syndrome 01/25/2013   Vertigo 10/17/2012   Renal insufficiency, mild 06/20/2011   Essential hypertension 12/31/2009   Osteoporosis 12/31/2009   SKIN CANCER, HX OF 12/31/2009   Proteinuria 01/05/2008   UNSPECIFIED ANEMIA 12/21/2007   Hyperlipidemia 09/08/2006   Polymyalgia rheumatica ([place]) 09/08/2006   CORONARY ARTERY BYPASS GRAFT, HX OF 09/08/2006    Past Surgical History:  Procedure Laterality Date   CORONARY ANGIOPLASTY  1993   LAD   CORONARY ANGIOPLASTY WITH STENT PLACEMENT  03/09/01   RCA   CORONARY ARTERY BYPASS GRAFT  1994   JOINT REPLACEMENT Right 1998   hip   NM MYOVIEW LTD  03/2012   Normal   SKIN GRAFT     to nose skin cancer   TOTAL HIP ARTHROPLASTY     TOTAL KNEE ARTHROPLASTY Left  10/09/2013   Procedure: TOTAL KNEE ARTHROPLASTY;  Surgeon: Lorn Junes, MD;  Location: [facility];  Service: Orthopedics;  Laterality: Left;     OB History   No obstetric history on file.      Home Medications    Prior to Admission medications   Medication Sig Start Date End Date Taking? Authorizing Provider  alendronate (FOSAMAX) 70 MG tablet Take 70 mg by mouth every Saturday. Take with a full glass of water on an empty stomach.    Yes [provider]  amLODipine (NORVASC) 5 MG tablet TAKE 1 TABLET(5 MG) BY MOUTH DAILY Patient taking differently: Take 5 mg by mouth daily.  06/01/18  Yes Burns, Claudina Lick, MD  aspirin 81 MG tablet Take 81 mg by mouth daily.   Yes [provider]  Calcium Carbonate-Vitamin D (CALCIUM 600-D PO) Take 1 tablet by mouth 2 (two) times daily.    Yes [provider]  carvedilol (COREG) 25 MG tablet TAKE 1 TABLET(25 MG) BY MOUTH TWICE DAILY WITH A MEAL Patient taking differently: Take 25 mg by mouth 2 (two) times daily with a meal.  04/20/18  Yes Burns, Claudina Lick, MD  colchicine (COLCRYS) 0.6 MG tablet Take 1 tablet (0.6 mg total) by mouth every other day. 05/02/18  Yes Burns, Claudina Lick, MD  furosemide (LASIX) 20 MG tablet TAKE 1 TABLET(20 MG) BY MOUTH DAILY Patient taking differently: Take 20 mg by mouth daily.  06/13/18  Yes Burns, Claudina Lick, MD  isosorbide mononitrate (IMDUR) 30 MG 24 hr tablet Take 1 tablet (30 mg total) by mouth daily. 07/02/17  Yes Burns, Claudina Lick, MD  levothyroxine (SYNTHROID, LEVOTHROID) 50 MCG tablet TAKE 1 TABLET(50 MCG) BY MOUTH DAILY Patient taking differently: Take 50 mcg by mouth daily.  07/02/18  Yes Burns, Claudina Lick, MD  losartan (COZAAR) 100 MG tablet TAKE 1 TABLET(100 MG) BY MOUTH DAILY Patient taking differently: Take 100 mg by mouth daily.  01/24/18  Yes Burns, Claudina Lick, MD  metFORMIN (GLUCOPHAGE) 500 MG tablet TAKE 1/2 TABLET BY MOUTH DAILY WITH LARGEST MEAL Patient taking differently: Take 250 mg by mouth daily. Take  1/2 tablet by mouth daily with largest meal 05/19/18  Yes Burns, Claudina Lick, MD  predniSONE (DELTASONE) 5 MG tablet Take 5 mg by mouth daily.   Yes [provider]  simvastatin (ZOCOR) 40 MG tablet TAKE 1 TABLET(40 MG) BY MOUTH AT BEDTIME Patient taking differently: Take 40 mg by mouth daily.  05/09/18  Yes Lorretta Harp, MD  vitamin B-12 (CYANOCOBALAMIN) 1000 MCG tablet Take 1,000 mcg by mouth daily.     Yes [provider]  Blood Glucose Monitoring Suppl (ONE  TOUCH ULTRA MINI) W/DEVICE KIT Use to test blood sugar twice daily ICD 10 E11 21 08/20/14   Hendricks Limes, MD  furosemide (LASIX) 20 MG tablet TAKE 1 TABLET(20 MG) BY MOUTH DAILY Patient not taking: No sig reported 04/04/18   Binnie Rail, MD  glucose blood (ONE TOUCH ULTRA TEST) test strip Use to test blood sugar twice daily.. DX E11.51 07/28/17   Binnie Rail, MD  OneTouch Delica Lancets 27O MISC TEST BLOOD SUGAR TWICE DAILY 07/12/18   Binnie Rail, MD    Family History Family History  Problem Relation Age of Onset   Heart disease Father    Cancer Sister        breast   Diabetes Sister    Heart disease Brother    Heart disease Brother    Heart disease Brother    Diabetes Sister    Stroke Mother     Social History Social History   Tobacco Use   Smoking status: Never Smoker   Smokeless tobacco: Never Used  Substance Use Topics   Alcohol use: No   Drug use: No     Allergies   Patient has no known allergies.   Review of Systems Review of Systems  Constitutional: Negative for chills and fever.  HENT: Negative for sore throat.   Eyes: Negative for visual disturbance.  Respiratory: Positive for cough. Negative for shortness of breath.   Cardiovascular: Negative for chest pain.  Gastrointestinal: Negative for abdominal pain, nausea and vomiting.  Genitourinary: Negative for dysuria, hematuria and urgency.  Musculoskeletal: Negative for back pain and neck pain.       Left shoulder  pain  Skin: Negative for rash.  Neurological: Negative for dizziness, light-headedness, numbness and headaches.  All other systems reviewed and are negative.   Physical Exam Updated Vital Signs BP (!) 143/68    Pulse 77    Temp 98.1 F (36.7 C) (Oral)    Resp (!) 25    Ht 5' 5"  (1.651 m)    Wt 71.7 kg    SpO2 96%    BMI 26.29 kg/m   Physical Exam Vitals signs and nursing note reviewed.  Constitutional:      General: She is not in acute distress.    Appearance: She is well-developed.  HENT:     Head: Normocephalic and atraumatic.  Eyes:     Conjunctiva/sclera: Conjunctivae normal.  Neck:     Musculoskeletal: Neck supple.  Cardiovascular:     Rate and Rhythm: Normal rate and regular rhythm.     Pulses: Normal pulses.     Heart sounds: Normal heart sounds. No murmur.  Pulmonary:     Effort: Pulmonary effort is normal. No respiratory distress.     Breath sounds: Normal breath sounds.  Abdominal:     Palpations: Abdomen is soft.     Tenderness: There is no abdominal tenderness.  Musculoskeletal:     Comments: No significant midline cervical, thoracic, or lumbar midline ttp.   Skin:    General: Skin is warm and dry.  Neurological:     Mental Status: She is alert.     Comments: Mental Status:  Alert, thought content appropriate, able to give a coherent history. Speech fluent without evidence of aphasia. Able to follow 2 step commands without difficulty.  Cranial Nerves:  II: pupils equal, round, reactive to light III,IV, VI: ptosis not present, extra-ocular motions intact bilaterally  V,VII: smile symmetric, facial light touch sensation equal VIII: hearing grossly  normal to voice  X: uvula elevates symmetrically  XI: bilateral shoulder shrug symmetric and strong XII: midline tongue extension without fassiculations Motor:  Normal tone. Globally decreased but symmetric strength of BUE and BLE major muscle groups including strong and equal grip strength and  dorsiflexion/plantar flexion  Sensory: light touch normal in all extremities. Cerebellar: normal finger-to-nose with bilateral upper extremities CV: 2+ radial and DP pulses     ED Treatments / Results  Labs (all labs ordered are listed, but only abnormal results are displayed) Labs Reviewed  BASIC METABOLIC PANEL - Abnormal; Notable for the following components:      Result Value   Glucose, Bld 150 (*)    Creatinine, Ser 1.14 (*)    GFR calc non Af Amer 44 (*)    GFR calc Af Amer 51 (*)    All other components within normal limits  CBC - Abnormal; Notable for the following components:   WBC 13.0 (*)    Platelets 148 (*)    All other components within normal limits  URINALYSIS, ROUTINE W REFLEX MICROSCOPIC - Abnormal; Notable for the following components:   Protein, ur 100 (*)    All other components within normal limits  CULTURE, BLOOD (ROUTINE X 2)  CULTURE, BLOOD (ROUTINE X 2)  LACTIC ACID, PLASMA  LACTIC ACID, PLASMA  INFLUENZA PANEL BY PCR (TYPE A & B)  I-STAT TROPONIN, ED    EKG EKG Interpretation  Date/Time:  Thursday July 14 2018 14:29:02 EDT Ventricular Rate:  89 PR Interval:    QRS Duration: 92 QT Interval:  370 QTC Calculation: 451 R Axis:   48 Text Interpretation:  Sinus rhythm Atrial premature complex Borderline T abnormalities, inferior leads since last tracing no significant change Confirmed by Daleen Bo [phone]) on 07/14/2018 5:21:33 PM   EKG Interpretation  Date/Time:  Thursday July 14 2018 14:34:47 EDT Ventricular Rate:  89 PR Interval:    QRS Duration: 94 QT Interval:  381 QTC Calculation: 464 R Axis:   45 Text Interpretation:  Sinus rhythm Artifact No significant change was found Since last tracing of earlier today Confirmed by Daleen Bo [phone]) on 07/14/2018 5:23:11 PM         Radiology Dg Ribs Unilateral W/chest Right  Result Date: 07/14/2018 CLINICAL DATA:  Diffuse right-sided chest and rib pain after falling today. EXAM:  RIGHT RIBS AND CHEST - 3+ VIEW COMPARISON:  05/16/2017 FINDINGS: Artifact overlies the chest. Previous median sternotomy. Cardiomegaly. Chronic aortic atherosclerosis. Pulmonary vascularity is normal. The lungs are clear. No pneumothorax or hemothorax. Rib films with marker in the region of pain show old healed or healing fractures of the anterior seventh and eighth ribs. I think there is an acute nondisplaced fracture of the anterior ninth rib. IMPRESSION: No active cardiopulmonary disease. Acute nondisplaced fracture of the anterior right ninth rib. Healing fractures of the anterior right seventh and eighth ribs. Electronically Signed   By: Nelson Chimes M.D.   On: 07/14/2018 15:56   Ct Head Wo Contrast  Result Date: 07/14/2018 CLINICAL DATA:  85 year old female with head, face and neck injury following fall. Initial encounter. EXAM: CT HEAD WITHOUT CONTRAST CT MAXILLOFACIAL WITHOUT CONTRAST CT CERVICAL SPINE WITHOUT CONTRAST TECHNIQUE: Multidetector CT imaging of the head, cervical spine, and maxillofacial structures were performed using the standard protocol without intravenous contrast. Multiplanar CT image reconstructions of the cervical spine and maxillofacial structures were also generated. COMPARISON:  06/15/2018 CT, 07/21/2015 MR and 05/31/2006 cervical spine radiographs FINDINGS: CT HEAD FINDINGS Brain:  No evidence of acute infarction, hemorrhage, hydrocephalus, extra-axial collection or mass lesion/mass effect. Atrophy, chronic small-vessel white matter ischemic changes and remote thalamic infarcts again noted. Vascular: Carotid atherosclerotic calcifications noted. Skull: Normal. Negative for fracture or focal lesion. Other: None. CT MAXILLOFACIAL FINDINGS Osseous: No fracture or mandibular dislocation. No destructive process. Orbits: Negative. No traumatic or inflammatory finding. Sinuses: Clear. Soft tissues: Negative. CT CERVICAL SPINE FINDINGS Alignment: Normal. Skull base and vertebrae: No acute  fracture. No primary bone lesion or focal pathologic process. Soft tissues and spinal canal: No prevertebral fluid or swelling. No visible canal hematoma. Disc levels: Mild-to-moderate multilevel degenerative disc disease, spondylosis and facet arthropathy again noted. Upper chest: No acute abnormality Other: None IMPRESSION: 1. No evidence of acute intracranial abnormality. Atrophy, chronic small-vessel white matter ischemic changes and remote thalamic infarcts. 2. No evidence of acute facial fracture. 3. No static evidence of acute injury to the cervical spine. Degenerative changes. Electronically Signed   By: Margarette Canada M.D.   On: 07/14/2018 15:46   Ct Cervical Spine Wo Contrast  Result Date: 07/14/2018 CLINICAL DATA:  85 year old female with head, face and neck injury following fall. Initial encounter. EXAM: CT HEAD WITHOUT CONTRAST CT MAXILLOFACIAL WITHOUT CONTRAST CT CERVICAL SPINE WITHOUT CONTRAST TECHNIQUE: Multidetector CT imaging of the head, cervical spine, and maxillofacial structures were performed using the standard protocol without intravenous contrast. Multiplanar CT image reconstructions of the cervical spine and maxillofacial structures were also generated. COMPARISON:  06/15/2018 CT, 07/21/2015 MR and 05/31/2006 cervical spine radiographs FINDINGS: CT HEAD FINDINGS Brain: No evidence of acute infarction, hemorrhage, hydrocephalus, extra-axial collection or mass lesion/mass effect. Atrophy, chronic small-vessel white matter ischemic changes and remote thalamic infarcts again noted. Vascular: Carotid atherosclerotic calcifications noted. Skull: Normal. Negative for fracture or focal lesion. Other: None. CT MAXILLOFACIAL FINDINGS Osseous: No fracture or mandibular dislocation. No destructive process. Orbits: Negative. No traumatic or inflammatory finding. Sinuses: Clear. Soft tissues: Negative. CT CERVICAL SPINE FINDINGS Alignment: Normal. Skull base and vertebrae: No acute fracture. No primary  bone lesion or focal pathologic process. Soft tissues and spinal canal: No prevertebral fluid or swelling. No visible canal hematoma. Disc levels: Mild-to-moderate multilevel degenerative disc disease, spondylosis and facet arthropathy again noted. Upper chest: No acute abnormality Other: None IMPRESSION: 1. No evidence of acute intracranial abnormality. Atrophy, chronic small-vessel white matter ischemic changes and remote thalamic infarcts. 2. No evidence of acute facial fracture. 3. No static evidence of acute injury to the cervical spine. Degenerative changes. Electronically Signed   By: Margarette Canada M.D.   On: 07/14/2018 15:46   Dg Shoulder Left  Result Date: 07/14/2018 CLINICAL DATA:  [place] today.  Shoulder pain. EXAM: LEFT SHOULDER - 2+ VIEW COMPARISON:  None. FINDINGS: Chronic osteoarthritis of the glenohumeral joint. No fracture or dislocation. Narrowed humeral acromial distance likely indicating chronic rotator cuff tear. IMPRESSION: Chronic degenerative arthritis of the glenohumeral joint. Narrowed humeral acromial distance likely indicating chronic rotator cuff tear. Electronically Signed   By: Nelson Chimes M.D.   On: 07/14/2018 15:57   Ct Maxillofacial Wo Contrast  Result Date: 07/14/2018 CLINICAL DATA:  85 year old female with head, face and neck injury following fall. Initial encounter. EXAM: CT HEAD WITHOUT CONTRAST CT MAXILLOFACIAL WITHOUT CONTRAST CT CERVICAL SPINE WITHOUT CONTRAST TECHNIQUE: Multidetector CT imaging of the head, cervical spine, and maxillofacial structures were performed using the standard protocol without intravenous contrast. Multiplanar CT image reconstructions of the cervical spine and maxillofacial structures were also generated. COMPARISON:  06/15/2018 CT, 07/21/2015 MR and 05/31/2006  cervical spine radiographs FINDINGS: CT HEAD FINDINGS Brain: No evidence of acute infarction, hemorrhage, hydrocephalus, extra-axial collection or mass lesion/mass effect. Atrophy,  chronic small-vessel white matter ischemic changes and remote thalamic infarcts again noted. Vascular: Carotid atherosclerotic calcifications noted. Skull: Normal. Negative for fracture or focal lesion. Other: None. CT MAXILLOFACIAL FINDINGS Osseous: No fracture or mandibular dislocation. No destructive process. Orbits: Negative. No traumatic or inflammatory finding. Sinuses: Clear. Soft tissues: Negative. CT CERVICAL SPINE FINDINGS Alignment: Normal. Skull base and vertebrae: No acute fracture. No primary bone lesion or focal pathologic process. Soft tissues and spinal canal: No prevertebral fluid or swelling. No visible canal hematoma. Disc levels: Mild-to-moderate multilevel degenerative disc disease, spondylosis and facet arthropathy again noted. Upper chest: No acute abnormality Other: None IMPRESSION: 1. No evidence of acute intracranial abnormality. Atrophy, chronic small-vessel white matter ischemic changes and remote thalamic infarcts. 2. No evidence of acute facial fracture. 3. No static evidence of acute injury to the cervical spine. Degenerative changes. Electronically Signed   By: Margarette Canada M.D.   On: 07/14/2018 15:46    Procedures Procedures (including critical care time)  Medications Ordered in ED Medications  sodium chloride flush (NS) 0.9 % injection 3 mL (3 mLs Intravenous Not Given 07/14/18 1431)  acetaminophen (TYLENOL) tablet 650 mg (650 mg Oral Given 07/14/18 1635)     Initial Impression / Assessment and Plan / ED Course  I have reviewed the triage vital signs and the nursing notes.  Pertinent labs & imaging results that were available during my care of the patient were reviewed by me and considered in my medical decision making (see chart for details).  Clinical Course as of Jul 13 2104  Thu Jul 14, 2018  1827 DG Ribs Unilateral W/Chest Right [EW]    Clinical Course User Index [EW] Daleen Bo, MD       Final Clinical Impressions(s) / ED Diagnoses   Final  diagnoses:  Fever of unknown origin  Fall, initial encounter  Closed fracture of one rib of right side, initial encounter   Patient presenting after fall that occurred earlier today. Did not hit head or lose consciousness. Landed on her chest. Is c/o right chest wall pain. Also c/o left shoulder pain for the last 5 days. Pain mostly occurs with movement. Has had mild cough but other wise no other sxs to suggest infection.    She was found to be febrile on arrival with otherwise normal VS.  Lungs CTAB. Heart with RRR. ABd soft and nontender.  CBC with slightly elevated white blood cell count.  No anemia. BMP with slightly elevated creatinine at 1.14.  Consistent with prior.  No gross electrolyte derangement. Troponin is negative. UA does not show any signs of urinary tract infection Lactic acid is negative Influenza panel was collected Blood cultures were obtained  At this point there is unclear cause of her fever.  Her only infectious symptom is that she has had a mild cough over the last several days.  She may have viral infection and will await influenza testing.  With regards to her fall, CT scan of the head is negative for acute intracranial abnormality, CT maxillofacial negative for acute bony abnormality, CT cervical spine negative for acute bony abnormality.  Chest x-ray with right ribs does show nondisplaced acute nondisplaced fracture of the anterior right ninth rib. Incentive spirometer ordered. Xray left shoulder does show degenerative changes with likely chronic rotator cuff tear.   Nursing attempted to ambulate pt and pt require 2  person assist due to generalized weakness. She denies significant pain with ambulation. She did not appear to have abnormal coordination. Pt daughter at bedside states pt lives alone currently. She does not feel that she will be able to safely care for the patient by herself at home with her being so weak.   We will plan for admission for further  treatment of generalized weakness, fever of unknown origin, and rib fractures from her fall PTA.  7:35 PM CONSULT With Dr. Jonelle Sidle who accepts pt for admission.   Case discussed with Dr. Eulis Foster who personally evaluated the pt and is in agreement with plan.   ED Discharge Orders    None       Bishop Dublin 07/14/18 2145    Daleen Bo, MD 07/17/18 1121

## 2018-07-14 NOTE — ED Triage Notes (Signed)
Pt fell and couldn't get up, no complaints of pain with help. Pt has left should pain for 5 days, cardiac hx 10 years ago. Pt has steadily getting weaker in last few days. Pt AO x 4. Family states pt is "too slow" 150/80, HR 86, CBG 210.

## 2018-07-15 ENCOUNTER — Ambulatory Visit: Payer: Medicare Other | Admitting: Orthotics

## 2018-07-15 LAB — BASIC METABOLIC PANEL WITH GFR
Anion gap: 6 (ref 5–15)
BUN: 10 mg/dL (ref 8–23)
CO2: 25 mmol/L (ref 22–32)
Calcium: 9.2 mg/dL (ref 8.9–10.3)
Chloride: 106 mmol/L (ref 98–111)
Creatinine, Ser: 1.21 mg/dL — ABNORMAL HIGH (ref 0.44–1.00)
GFR calc Af Amer: 48 mL/min — ABNORMAL LOW
GFR calc non Af Amer: 41 mL/min — ABNORMAL LOW
Glucose, Bld: 191 mg/dL — ABNORMAL HIGH (ref 70–99)
Potassium: 4 mmol/L (ref 3.5–5.1)
Sodium: 137 mmol/L (ref 135–145)

## 2018-07-15 LAB — COMPREHENSIVE METABOLIC PANEL WITH GFR
ALT: 13 U/L (ref 0–44)
AST: 19 U/L (ref 15–41)
Albumin: 2.7 g/dL — ABNORMAL LOW (ref 3.5–5.0)
Alkaline Phosphatase: 54 U/L (ref 38–126)
Anion gap: 10 (ref 5–15)
BUN: 9 mg/dL (ref 8–23)
CO2: 26 mmol/L (ref 22–32)
Calcium: 9.3 mg/dL (ref 8.9–10.3)
Chloride: 103 mmol/L (ref 98–111)
Creatinine, Ser: 1.11 mg/dL — ABNORMAL HIGH (ref 0.44–1.00)
GFR calc Af Amer: 53 mL/min — ABNORMAL LOW
GFR calc non Af Amer: 46 mL/min — ABNORMAL LOW
Glucose, Bld: 156 mg/dL — ABNORMAL HIGH (ref 70–99)
Potassium: 2.8 mmol/L — ABNORMAL LOW (ref 3.5–5.1)
Sodium: 139 mmol/L (ref 135–145)
Total Bilirubin: 1.2 mg/dL (ref 0.3–1.2)
Total Protein: 5.2 g/dL — ABNORMAL LOW (ref 6.5–8.1)

## 2018-07-15 LAB — GLUCOSE, CAPILLARY
Glucose-Capillary: 164 mg/dL — ABNORMAL HIGH (ref 70–99)
Glucose-Capillary: 145 mg/dL — ABNORMAL HIGH (ref 70–99)
Glucose-Capillary: 177 mg/dL — ABNORMAL HIGH (ref 70–99)
Glucose-Capillary: 157 mg/dL — ABNORMAL HIGH (ref 70–99)

## 2018-07-15 LAB — CBC
HCT: 33.3 % — ABNORMAL LOW (ref 36.0–46.0)
Hemoglobin: 11 g/dL — ABNORMAL LOW (ref 12.0–15.0)
MCH: 29.6 pg (ref 26.0–34.0)
MCHC: 33 g/dL (ref 30.0–36.0)
MCV: 89.5 fL (ref 80.0–100.0)
Platelets: 140 K/uL — ABNORMAL LOW (ref 150–400)
RBC: 3.72 MIL/uL — ABNORMAL LOW (ref 3.87–5.11)
RDW: 13.8 % (ref 11.5–15.5)
WBC: 10.7 K/uL — ABNORMAL HIGH (ref 4.0–10.5)
nRBC: 0 % (ref 0.0–0.2)

## 2018-07-15 MED ORDER — HYDROCODONE-ACETAMINOPHEN 5-325 MG PO TABS
1.0000 | ORAL_TABLET | Freq: Four times a day (QID) | ORAL | Status: DC | PRN
  Administered 2018-07-17 (×3): 1 via ORAL
  Filled 2018-07-15 (×4): qty 1

## 2018-07-15 MED ORDER — TRAMADOL HCL 50 MG PO TABS: 50.0000 mg | ORAL_TABLET | Freq: Four times a day (QID) | ORAL | Status: DC | PRN

## 2018-07-15 MED ORDER — POTASSIUM CHLORIDE CRYS ER 20 MEQ PO TBCR
40.0000 meq | EXTENDED_RELEASE_TABLET | ORAL | Status: AC
  Administered 2018-07-15 (×2): 40 meq via ORAL
  Filled 2018-07-15 (×2): qty 2

## 2018-07-15 MED ORDER — ACETAMINOPHEN 325 MG PO TABS
650.0000 mg | ORAL_TABLET | Freq: Four times a day (QID) | ORAL | Status: DC | PRN
  Administered 2018-07-15 – 2018-07-19 (×4): 650 mg via ORAL
  Filled 2018-07-15 (×4): qty 2

## 2018-07-15 NOTE — Progress Notes (Signed)
[facility] Progress Note  Patient: Sheila Morgan WGY:[id number]   PCP: Binnie Rail, MD DOB: 1932-10-15   DOA: 07/14/2018   DOS: 07/15/2018   Date of Service: the patient was seen and examined on 07/15/2018  Brief hospital course: Pt. with PMH of CAD, OA, HTN, DM, gout, PMR; admitted on 07/14/2018, presented with complaint of fall, was found to have rib fracture. Currently further plan is pain control.  Subjective: Continues to have severe pain.  No nausea no vomiting no fever no chills.  Assessment and Plan: #1 status post fall: Appears to be mechanical.  Patient denied losing consciousness.  She however has remote history of vertigo as well as multiple cardiac reasons.  At this point we will admit the patient and put her on telemetry.  Observe the patient.  PT as well as OT consultation.  She is requiring 2 people to assist her.  Patient may end of going to skilled facility if she does not turn around quickly.  #2 multiple rib fractures: Pain control and incentive spirometry will be deployed.  #3 hypertension: Continue blood pressure medications from home.  #4 diabetes: continue home regimen with sliding scale insulin.  #5 hypothyroidism: Continue levothyroxine.  #6 history of coronary artery disease: Stable at this point.  Continue monitoring.  #7 hyperlipidemia: Continue with statin.  Diet: regular dfiet DVT Prophylaxis: subcutaneous Heparin  Advance goals of care discussion: gull code  Family Communication: family was present at bedside, at the time of interview. The pt provided permission to discuss medical plan with the family. Opportunity was given to ask question and all questions were answered satisfactorily.   Disposition:  Discharge to home.  Consultants: nonen Procedures: one  Scheduled Meds:  amLODipine  5 mg Oral Daily   aspirin EC  81 mg Oral Daily   calcium-vitamin D  1 tablet Oral BID   carvedilol  25 mg Oral BID WC   colchicine  0.6 mg  Oral QODAY   enoxaparin (LOVENOX) injection  40 mg Subcutaneous Q24H   furosemide  20 mg Oral Daily   insulin aspart  0-5 Units Subcutaneous QHS   insulin aspart  0-9 Units Subcutaneous TID WC   isosorbide mononitrate  30 mg Oral Daily   levothyroxine  50 mcg Oral Daily   losartan  100 mg Oral Daily   predniSONE  5 mg Oral Q breakfast   simvastatin  40 mg Oral Daily   sodium chloride flush  3 mL Intravenous Once   vitamin B-12  1,000 mcg Oral Daily   Continuous Infusions: PRN Meds: acetaminophen, ondansetron **OR** ondansetron (ZOFRAN) IV, traMADol Antibiotics: Anti-infectives (From admission, onward)   None       Objective: Physical Exam: Vitals:   07/14/18 2100 07/14/18 2134 07/15/18 0419 07/15/18 1336  BP: (!) 143/68 (!) 161/64 128/65 140/69  Pulse: 77 60 81 90  Resp: (!) 25 16 14 17   Temp:  98.9 F (37.2 C) 98.3 F (36.8 C) 98.7 F (37.1 C)  TempSrc:  Oral  Oral  SpO2: 96% 98% 97% 99%  Weight:      Height:        Intake/Output Summary (Last 24 hours) at 07/15/2018 1340 Last data filed at 07/15/2018 0900 Gross per 24 hour  Intake 240 ml  Output --  Net 240 ml   Filed Weights   07/14/18 1416  Weight: 71.7 kg   General: Alert, Awake and Oriented to Time, Place and Person. Appear in moderate distress, affect appropriate Eyes:  PERRL, Conjunctiva normal ENT: Oral Mucosa clear moist. Neck: no JVD, no Abnormal Mass Or lumps Cardiovascular: S1 and S2 Present, no Murmur, Peripheral Pulses Present Respiratory: normal respiratory effort, Bilateral Air entry equal and Decreased, no use of accessory muscle, Clear to Auscultation, no Crackles, no wheezes Abdomen: Bowel Sound present, Soft and no tenderness, no hernia Skin: no redness, no Rash, n induration Extremities: no Pedal edema, no calf tenderness Neurologic: Grossly no focal neuro deficit. Bilaterally Equal motor strength Data Reviewed: CBC: Recent Labs  Lab 07/14/18 1423 07/14/18 2240  07/15/18 0229  WBC 13.0* 10.2 10.7*  HGB 13.6 12.1 11.0*  HCT 41.2 36.7 33.3*  MCV 90.7 90.2 89.5  PLT 148* 150 629*   Basic Metabolic Panel: Recent Labs  Lab 07/14/18 1423 07/14/18 2240 07/15/18 0229  NA 136  --  139  K 4.3  --  2.8*  CL 101  --  103  CO2 25  --  26  GLUCOSE 150*  --  156*  BUN 12  --  9  CREATININE 1.14* 1.07* 1.11*  CALCIUM 10.3  --  9.3    Liver Function Tests: Recent Labs  Lab 07/15/18 0229  AST 19  ALT 13  ALKPHOS 54  BILITOT 1.2  PROT 5.2*  ALBUMIN 2.7*   No results for input(s): LIPASE, AMYLASE in the last 168 hours. No results for input(s): AMMONIA in the last 168 hours. Coagulation Profile: No results for input(s): INR, PROTIME in the last 168 hours. Cardiac Enzymes: No results for input(s): CKTOTAL, CKMB, CKMBINDEX, TROPONINI in the last 168 hours. BNP (last 3 results) No results for input(s): PROBNP in the last 8760 hours. CBG: Recent Labs  Lab 07/14/18 2146 07/15/18 0824 07/15/18 1213  GLUCAP 199* 164* 145*   Studies: Dg Ribs Unilateral W/chest Right  Result Date: 07/14/2018 CLINICAL DATA:  Diffuse right-sided chest and rib pain after falling today. EXAM: RIGHT RIBS AND CHEST - 3+ VIEW COMPARISON:  05/16/2017 FINDINGS: Artifact overlies the chest. Previous median sternotomy. Cardiomegaly. Chronic aortic atherosclerosis. Pulmonary vascularity is normal. The lungs are clear. No pneumothorax or hemothorax. Rib films with marker in the region of pain show old healed or healing fractures of the anterior seventh and eighth ribs. I think there is an acute nondisplaced fracture of the anterior ninth rib. IMPRESSION: No active cardiopulmonary disease. Acute nondisplaced fracture of the anterior right ninth rib. Healing fractures of the anterior right seventh and eighth ribs. Electronically Signed   By: Nelson Chimes M.D.   On: 07/14/2018 15:56   Ct Head Wo Contrast  Result Date: 07/14/2018 CLINICAL DATA:  85 year old female with head, face  and neck injury following fall. Initial encounter. EXAM: CT HEAD WITHOUT CONTRAST CT MAXILLOFACIAL WITHOUT CONTRAST CT CERVICAL SPINE WITHOUT CONTRAST TECHNIQUE: Multidetector CT imaging of the head, cervical spine, and maxillofacial structures were performed using the standard protocol without intravenous contrast. Multiplanar CT image reconstructions of the cervical spine and maxillofacial structures were also generated. COMPARISON:  06/15/2018 CT, 07/21/2015 MR and 05/31/2006 cervical spine radiographs FINDINGS: CT HEAD FINDINGS Brain: No evidence of acute infarction, hemorrhage, hydrocephalus, extra-axial collection or mass lesion/mass effect. Atrophy, chronic small-vessel white matter ischemic changes and remote thalamic infarcts again noted. Vascular: Carotid atherosclerotic calcifications noted. Skull: Normal. Negative for fracture or focal lesion. Other: None. CT MAXILLOFACIAL FINDINGS Osseous: No fracture or mandibular dislocation. No destructive process. Orbits: Negative. No traumatic or inflammatory finding. Sinuses: Clear. Soft tissues: Negative. CT CERVICAL SPINE FINDINGS Alignment: Normal. Skull base and vertebrae: No  acute fracture. No primary bone lesion or focal pathologic process. Soft tissues and spinal canal: No prevertebral fluid or swelling. No visible canal hematoma. Disc levels: Mild-to-moderate multilevel degenerative disc disease, spondylosis and facet arthropathy again noted. Upper chest: No acute abnormality Other: None IMPRESSION: 1. No evidence of acute intracranial abnormality. Atrophy, chronic small-vessel white matter ischemic changes and remote thalamic infarcts. 2. No evidence of acute facial fracture. 3. No static evidence of acute injury to the cervical spine. Degenerative changes. Electronically Signed   By: Margarette Canada M.D.   On: 07/14/2018 15:46   Ct Cervical Spine Wo Contrast  Result Date: 07/14/2018 CLINICAL DATA:  85 year old female with head, face and neck injury  following fall. Initial encounter. EXAM: CT HEAD WITHOUT CONTRAST CT MAXILLOFACIAL WITHOUT CONTRAST CT CERVICAL SPINE WITHOUT CONTRAST TECHNIQUE: Multidetector CT imaging of the head, cervical spine, and maxillofacial structures were performed using the standard protocol without intravenous contrast. Multiplanar CT image reconstructions of the cervical spine and maxillofacial structures were also generated. COMPARISON:  06/15/2018 CT, 07/21/2015 MR and 05/31/2006 cervical spine radiographs FINDINGS: CT HEAD FINDINGS Brain: No evidence of acute infarction, hemorrhage, hydrocephalus, extra-axial collection or mass lesion/mass effect. Atrophy, chronic small-vessel white matter ischemic changes and remote thalamic infarcts again noted. Vascular: Carotid atherosclerotic calcifications noted. Skull: Normal. Negative for fracture or focal lesion. Other: None. CT MAXILLOFACIAL FINDINGS Osseous: No fracture or mandibular dislocation. No destructive process. Orbits: Negative. No traumatic or inflammatory finding. Sinuses: Clear. Soft tissues: Negative. CT CERVICAL SPINE FINDINGS Alignment: Normal. Skull base and vertebrae: No acute fracture. No primary bone lesion or focal pathologic process. Soft tissues and spinal canal: No prevertebral fluid or swelling. No visible canal hematoma. Disc levels: Mild-to-moderate multilevel degenerative disc disease, spondylosis and facet arthropathy again noted. Upper chest: No acute abnormality Other: None IMPRESSION: 1. No evidence of acute intracranial abnormality. Atrophy, chronic small-vessel white matter ischemic changes and remote thalamic infarcts. 2. No evidence of acute facial fracture. 3. No static evidence of acute injury to the cervical spine. Degenerative changes. Electronically Signed   By: Margarette Canada M.D.   On: 07/14/2018 15:46   Dg Shoulder Left  Result Date: 07/14/2018 CLINICAL DATA:  [place] today.  Shoulder pain. EXAM: LEFT SHOULDER - 2+ VIEW COMPARISON:  None.  FINDINGS: Chronic osteoarthritis of the glenohumeral joint. No fracture or dislocation. Narrowed humeral acromial distance likely indicating chronic rotator cuff tear. IMPRESSION: Chronic degenerative arthritis of the glenohumeral joint. Narrowed humeral acromial distance likely indicating chronic rotator cuff tear. Electronically Signed   By: Nelson Chimes M.D.   On: 07/14/2018 15:57   Ct Maxillofacial Wo Contrast  Result Date: 07/14/2018 CLINICAL DATA:  85 year old female with head, face and neck injury following fall. Initial encounter. EXAM: CT HEAD WITHOUT CONTRAST CT MAXILLOFACIAL WITHOUT CONTRAST CT CERVICAL SPINE WITHOUT CONTRAST TECHNIQUE: Multidetector CT imaging of the head, cervical spine, and maxillofacial structures were performed using the standard protocol without intravenous contrast. Multiplanar CT image reconstructions of the cervical spine and maxillofacial structures were also generated. COMPARISON:  06/15/2018 CT, 07/21/2015 MR and 05/31/2006 cervical spine radiographs FINDINGS: CT HEAD FINDINGS Brain: No evidence of acute infarction, hemorrhage, hydrocephalus, extra-axial collection or mass lesion/mass effect. Atrophy, chronic small-vessel white matter ischemic changes and remote thalamic infarcts again noted. Vascular: Carotid atherosclerotic calcifications noted. Skull: Normal. Negative for fracture or focal lesion. Other: None. CT MAXILLOFACIAL FINDINGS Osseous: No fracture or mandibular dislocation. No destructive process. Orbits: Negative. No traumatic or inflammatory finding. Sinuses: Clear. Soft tissues: Negative. CT CERVICAL SPINE  FINDINGS Alignment: Normal. Skull base and vertebrae: No acute fracture. No primary bone lesion or focal pathologic process. Soft tissues and spinal canal: No prevertebral fluid or swelling. No visible canal hematoma. Disc levels: Mild-to-moderate multilevel degenerative disc disease, spondylosis and facet arthropathy again noted. Upper chest: No acute  abnormality Other: None IMPRESSION: 1. No evidence of acute intracranial abnormality. Atrophy, chronic small-vessel white matter ischemic changes and remote thalamic infarcts. 2. No evidence of acute facial fracture. 3. No static evidence of acute injury to the cervical spine. Degenerative changes. Electronically Signed   By: Margarette Canada M.D.   On: 07/14/2018 15:46     Time spent: 30 minutes  Author: Berle Mull, MD [facility] 07/15/2018 1:40 PM  To reach On-call, see care teams to locate the attending and reach out to them via [email or web address]. If 7PM-7AM, please contact night-coverage If you still have difficulty reaching the attending provider, please page the [facility] (Director on Call) for [facility] on amion for assistance.

## 2018-07-16 DIAGNOSIS — S2241XD Multiple fractures of ribs, right side, subsequent encounter for fracture with routine healing: Secondary | ICD-10-CM

## 2018-07-16 DIAGNOSIS — R531 Weakness: Secondary | ICD-10-CM

## 2018-07-16 DIAGNOSIS — H9193 Unspecified hearing loss, bilateral: Secondary | ICD-10-CM

## 2018-07-16 DIAGNOSIS — E7849 Other hyperlipidemia: Secondary | ICD-10-CM

## 2018-07-16 DIAGNOSIS — W19XXXD Unspecified fall, subsequent encounter: Secondary | ICD-10-CM

## 2018-07-16 LAB — GLUCOSE, CAPILLARY
GLUCOSE-CAPILLARY: 157 mg/dL — AB (ref 70–99)
Glucose-Capillary: 168 mg/dL — ABNORMAL HIGH (ref 70–99)
Glucose-Capillary: 222 mg/dL — ABNORMAL HIGH (ref 70–99)
Glucose-Capillary: 213 mg/dL — ABNORMAL HIGH (ref 70–99)

## 2018-07-16 NOTE — Plan of Care (Signed)

## 2018-07-16 NOTE — Progress Notes (Signed)
PROGRESS NOTE    Patient: Sheila Morgan                            PCP: Binnie Rail, MD                    DOB: 02/16/1933            DOA: 07/14/2018 [id number]             DOS: 07/16/2018, 1:15 PM   LOS: 2 days   Date of Service: The patient was seen and examined on 07/16/2018  Subjective:   The patient was seen and examined this morning.   Tinges to complain of severe pain when any exertion.  Hard of hearing.  Accompanied by daughter. Still complaining of rib pain, not taking deep breaths. No issues overnight, in bed, stable. Daughter patient has not moved out of the bed yet. Daughter reports that patient lives alone, she is concerned.    Brief Narrative:   Pt. with PMH of CAD, OA, HTN, DM, gout, PMR; admitted on 07/14/2018, presented with complaint of fall, was found to have rib fracture.  Confirmed that she did not had any dizziness, did not lose consciousness. Currently further plan is pain control.    Principal Problem:   Fall Active Problems:   Hyperlipidemia   Essential hypertension   CORONARY ARTERY BYPASS GRAFT, HX OF   Weakness generalized   DM (diabetes mellitus) type II, controlled, with peripheral vascular disorder (HCC)   Hypothyroidism   Multiple closed fractures of ribs of right side   Hearing loss   Fever, unspecified    Assessment & Plan:     Status post fall: -Patient remains in bed, has not ambulated yet. -For a significant pain in her ribs with any exertion at this time.  Appears to be mechanical. Patient denied losing consciousness. She however has remote history of vertigo as well as multiple cardiac reasons.   -Pending PT/OT evaluation recommendations -Out of concern as patient is living at home independently -She is requiring 2 people assist with her normal ADL  Patient may end of going to skilled facility if she does not turn around quickly.   Multiple rib fractures:Pain control and incentive spirometry will be  deployed.  Hypertension:Continue blood pressure medications from home.  Diabetes:continue home regimen with sliding scale insulin.  Hypothyroidism: Continue levothyroxine.  History of coronary artery disease: Stable at this point. Continue monitoring.  Hyperlipidemia:Continue with statin.  Diet: regular dfiet DVT Prophylaxis: subcutaneous Heparin  Advance goals of care discussion: gull code  Family Communication: family was present at bedside, at the time of interview. The pt provided permission to discuss medical plan with the family. Opportunity was given to ask question and all questions were answered satisfactorily.   Disposition:  Discharge to home.  Consultants: nonen Procedures: one  Procedures:   No admission procedures for hospital encounter.     Antimicrobials:  Anti-infectives (From admission, onward)   None       Medication:  . amLODipine  5 mg Oral Daily  . aspirin EC  81 mg Oral Daily  . calcium-vitamin D  1 tablet Oral BID  . carvedilol  25 mg Oral BID WC  . colchicine  0.6 mg Oral QODAY  . enoxaparin (LOVENOX) injection  40 mg Subcutaneous Q24H  . furosemide  20 mg Oral Daily  . insulin aspart  0-5 Units Subcutaneous QHS  .  insulin aspart  0-9 Units Subcutaneous TID WC  . isosorbide mononitrate  30 mg Oral Daily  . levothyroxine  50 mcg Oral Daily  . losartan  100 mg Oral Daily  . predniSONE  5 mg Oral Q breakfast  . simvastatin  40 mg Oral Daily  . sodium chloride flush  3 mL Intravenous Once  . vitamin B-12  1,000 mcg Oral Daily    acetaminophen, HYDROcodone-acetaminophen, ondansetron **OR** ondansetron (ZOFRAN) IV     Objective:   Vitals:   07/15/18 0419 07/15/18 1336 07/15/18 2052 07/16/18 0425  BP: 128/65 140/69 (!) 153/68 (!) 154/69  Pulse: 81 90 84 94  Resp: 14 17 15 15   Temp: 98.3 F (36.8 C) 98.7 F (37.1 C) 99.6 F (37.6 C) 99.2 F (37.3 C)  TempSrc:  Oral Oral Oral  SpO2: 97% 99% 96% 99%  Weight:       Height:        Intake/Output Summary (Last 24 hours) at 07/16/2018 1315 Last data filed at 07/16/2018 0900 Gross per 24 hour  Intake 720 ml  Output 950 ml  Net -230 ml   Filed Weights   07/14/18 1416  Weight: 71.7 kg     Examination:    General exam: Appears calm and comfortable  BP (!) 154/69 (BP Location: Left Arm)   Pulse 94   Temp 99.2 F (37.3 C) (Oral)   Resp 15   Ht 5\' 5"  (1.651 m)   Wt 71.7 kg   SpO2 99%   BMI 26.29 kg/m    Physical Exam  Constitution:  Alert, cooperative, no distress,  Psychiatric: Normal and stable mood and affect, cognition intact,   HEENT: Normocephalic, PERRL, otherwise with in Normal limits  Chest:Chest symmetric Cardio vascular:  S1/S2, RRR, No murmure, No Rubs or Gallops  pulmonary: Clear to auscultation bilaterally, respirations unlabored, negative wheezes / crackles Abdomen: Soft, non-tender, non-distended, bowel sounds,no masses, no organomegaly Muscular skeletal: Limited exam - in bed, able to move all 4 extremities, Normal strength,  Neuro: CNII-XII intact. , normal motor and sensation, reflexes intact  Extremities: No pitting edema lower extremities, +2 pulses  Skin: Dry, warm to touch, negative for any Rashes, No open wounds Wounds: per nursing documentation  LABs:  CBC Latest Ref Rng & Units 07/15/2018 07/14/2018 07/14/2018  WBC 4.0 - 10.5 K/uL 10.7(H) 10.2 13.0(H)  Hemoglobin 12.0 - 15.0 g/dL 11.0(L) 12.1 13.6  Hematocrit 36.0 - 46.0 % 33.3(L) 36.7 41.2  Platelets 150 - 400 K/uL 140(L) 150 148(L)   CMP Latest Ref Rng & Units 07/15/2018 07/15/2018 07/14/2018  Glucose 70 - 99 mg/dL 191(H) 156(H) -  BUN 8 - 23 mg/dL 10 9 -  Creatinine 0.44 - 1.00 mg/dL 1.21(H) 1.11(H) 1.07(H)  Sodium 135 - 145 mmol/L 137 139 -  Potassium 3.5 - 5.1 mmol/L 4.0 2.8(L) -  Chloride 98 - 111 mmol/L 106 103 -  CO2 22 - 32 mmol/L 25 26 -  Calcium 8.9 - 10.3 mg/dL 9.2 9.3 -  Total Protein 6.5 - 8.1 g/dL - 5.2(L) -  Total Bilirubin 0.3 - 1.2 mg/dL -  1.2 -  Alkaline Phos 38 - 126 U/L - 54 -  AST 15 - 41 U/L - 19 -  ALT 0 - 44 U/L - 13 -

## 2018-07-17 LAB — GLUCOSE, CAPILLARY
Glucose-Capillary: 97 mg/dL (ref 70–99)
Glucose-Capillary: 227 mg/dL — ABNORMAL HIGH (ref 70–99)
Glucose-Capillary: 172 mg/dL — ABNORMAL HIGH (ref 70–99)
Glucose-Capillary: 175 mg/dL — ABNORMAL HIGH (ref 70–99)

## 2018-07-17 NOTE — Progress Notes (Signed)
Occupational Therapy Evaluation Patient Details Name: Sheila Morgan MRN: [id number] DOB: 01/12/1933 Today's Date: 07/17/2018    History of Present Illness  85 y.o. female with medical history significant of CAD , OA , HTN, osteoporosis, DM , gout, HOH ,Right shoulder radiculopathy, and PMR who was brought in to the ER today after sustaining a mechanical fall at home with complaints of rib pain.   Clinical Impression   PTA. Pt PLOF was Mod Independent in all ADLs with use of AE and additional time. Pt has ample support from children who comes ~3x/daily. Pt currently requires assistance with bed mobility, functional transfers, and clothing management due to pain and safety. Pt will benefit from continued skilled OT in acute setting as well as SNF setting to maximize independence and improve strength for engagement in ADLs and safe return to home setting. DC recommendation to SNF. OT will continue to follow acutely.    Follow Up Recommendations  SNF;Follow surgeon's recommendation for DC plan and follow-up therapies;Supervision/Assistance - 24 hour    Equipment Recommendations  3 in 1 bedside commode    Recommendations for Other Services       Precautions / Restrictions        Mobility Bed Mobility Overal bed mobility: Needs Assistance Bed Mobility: Supine to Sit     Supine to sit: Min assist     General bed mobility comments: Pt requires assistance to power to seated position at EOB and assist to bring LE to EOB.  Scoot pad used for additional assistacne.  Transfers Overall transfer level: Needs assistance Equipment used: Rolling walker (2 wheeled) Transfers: Sit to/from Stand Sit to Stand: Min assist         General transfer comment: pt educated of safe transfer and transitions. VCs for sequencing and hand placement    Balance Overall balance assessment: Needs assistance Sitting-balance support: Single extremity supported Sitting balance-Leahy Scale: Fair      Standing balance support: Bilateral upper extremity supported Standing balance-Leahy Scale: Poor                             ADL either performed or assessed with clinical judgement   ADL Overall ADL's : Needs assistance/impaired Eating/Feeding: Independent   Grooming: Wash/dry face;Wash/dry hands;Set up;Standing;With adaptive equipment       Lower Body Bathing: Moderate assistance;Sit to/from stand(Pt stood by [facility] over toilet with assist for toileting hygien)       Lower Body Dressing: Modified independent;Sitting/lateral leans Lower Body Dressing Details (indicate cue type and reason): Pt able to demonstrate LB dressing of don/doff of socks with increased time. Toilet Transfer: Minimal Print production planner Details (indicate cue type and reason): Min A sit to stand, Min A transfer. Toileting- Clothing Manipulation and Hygiene: Moderate assistance Toileting - Clothing Manipulation Details (indicate cue type and reason): Pt stood by [facility] over toilet with assist for toileting hygien     Functional mobility during ADLs: Minimal assistance General ADL Comments: Pt requires additional time with completion of tasks. With increased practice of sequence of task pt will demonstrate improved functional transfers for ADLs.      Vision Baseline Vision/History: Wears glasses Wears Glasses: At all times       Perception     Praxis      Pertinent Vitals/Pain Pain Assessment: 0-10 Pain Score: 8  Pain Location: Ribs (R) Pain Descriptors / Indicators: Aching;Guarding Pain Intervention(s): Monitored during session;Repositioned     Hand Dominance  Left   Extremity/Trunk Assessment Upper Extremity Assessment Upper Extremity Assessment: Overall WFL for tasks assessed   Lower Extremity Assessment Lower Extremity Assessment: Defer to PT evaluation       Communication Communication Communication: HOH(wears hearing aide)   Cognition Arousal/Alertness:  Awake/alert Behavior During Therapy: WFL for tasks assessed/performed Overall Cognitive Status: Within Functional Limits for tasks assessed                                     General Comments  Daughter present during session    Exercises     Shoulder Instructions      Home Living Family/patient expects to be discharged to:: Private residence Living Arrangements: Alone Available Help at Discharge: Family;Friend(s) Type of Home: House Home Access: Level entry(entered side door)     Home Layout: Multi-level   Alternate Level Stairs-Rails: Right Bathroom Shower/Tub: Teacher, early years/pre: Handicapped height     Home Equipment: Cane - single point;Walker - 2 wheels;Shower seat   Additional Comments: Pts daughter reports her and brother checks on her 3x daily. She just started living alone 2 months ago due to niece leaving from residence.       Prior Functioning/Environment Level of Independence: Independent with assistive device(s)        Comments: Pt able to perform ADL tasks with increased time.        OT Problem List: Decreased strength;Impaired balance (sitting and/or standing);Decreased safety awareness;Decreased knowledge of use of DME or AE;Pain      OT Treatment/Interventions: Self-care/ADL training;Therapeutic exercise;Therapeutic activities;Balance training;Patient/family education    OT Goals(Current goals can be found in the care plan section) Acute Rehab OT Goals Patient Stated Goal: to return home OT Goal Formulation: With family Time For Goal Achievement: 07/31/18 Potential to Achieve Goals: Good  OT Frequency: Min 2X/week   Barriers to D/C:    3 level home. Pt's daughter reports possibly moving into pt's home or pt moving in with her.       Co-evaluation              AM-PAC OT "6 Clicks" Daily Activity     Outcome Measure Help from another person eating meals?: None Help from another person taking care of  personal grooming?: A Little Help from another person toileting, which includes using toliet, bedpan, or urinal?: A Little Help from another person bathing (including washing, rinsing, drying)?: A Lot Help from another person to put on and taking off regular upper body clothing?: A Little Help from another person to put on and taking off regular lower body clothing?: A Little 6 Click Score: 18   End of Session Equipment Utilized During Treatment: Gait belt;Rolling walker Nurse Communication: Mobility status;Weight bearing status  Activity Tolerance: Patient tolerated treatment well(some pain of rib.) Patient left: in chair;with call bell/phone within reach;with chair alarm set;with family/visitor present  OT Visit Diagnosis: Unsteadiness on feet (R26.81);Muscle weakness (generalized) (M62.81);Pain Pain - Right/Left: Right Pain - part of body: (Ribs)                Time: 1000-1040 OT Time Calculation (min): 40 min Charges:  OT General Charges $OT Visit: 1 Visit OT Evaluation $OT Eval Moderate Complexity: 1 Mod OT Treatments $Self Care/Home Management : 8-22 mins  Minus Breeding, MSOT, OTR/L  Supplemental Rehabilitation Services  [phone]  Marius Ditch 07/17/2018, 11:34 AM

## 2018-07-17 NOTE — Plan of Care (Signed)
  Problem: Education: Goal: Knowledge of General Education information will improve Description Including pain rating scale, medication(s)/side effects and non-pharmacologic comfort measures Outcome: Progressing   Problem: Health Behavior/Discharge Planning: Goal: Ability to manage health-related needs will improve Outcome: Progressing   

## 2018-07-17 NOTE — Progress Notes (Signed)
PROGRESS NOTE    Patient: Sheila Morgan                            PCP: Binnie Rail, MD                    DOB: 1932/07/21            DOA: 07/14/2018 [id number]             DOS: 07/17/2018, 2:43 PM   Date of Service: The patient was seen and examined on 07/17/2018  Subjective:   Patient was seen alongside patient's daughter.  Rib pain is better controlled.  Patient is awaiting discharge to skilled nursing facility.  Brief Narrative:   Pt. with PMH of CAD, OA, HTN, DM, gout, PMR; admitted on 07/14/2018, presented with complaint of fall, was found to have rib fracture.  Confirmed that she did not had any dizziness, did not lose consciousness. Currently further plan is pain control.  Principal Problem:   Fall Active Problems:   Hyperlipidemia   Essential hypertension   CORONARY ARTERY BYPASS GRAFT, HX OF   Weakness generalized   DM (diabetes mellitus) type II, controlled, with peripheral vascular disorder (HCC)   Hypothyroidism   Multiple closed fractures of ribs of right side   Hearing loss   Fever, unspecified    Assessment & Plan:     Status post fall: -Patient remains in bed, has not ambulated yet. -For a significant pain in her ribs with any exertion at this time.  Appears to be mechanical. Patient denied losing consciousness. She however has remote history of vertigo as well as multiple cardiac reasons.   -Pending PT/OT evaluation recommendations -Out of concern as patient is living at home independently -She is requiring 2 people assist with her normal ADL  Patient may end of going to skilled facility if she does not turn around quickly. 07/17/2018: Pursue discharge to skilled nursing facility.     Multiple rib fractures: Pain control and incentive spirometry will be deployed. 07/17/2018: Pain control is optimized.  Hypertension: Continue blood pressure medications from home. Optimize.  Diabetes: continue home regimen with sliding scale  insulin. 07/17/2018: Continue to monitor closely.  Hypothyroidism:  Continue levothyroxine.  History of coronary artery disease:  Stable at this point. Continue monitoring.  Hyperlipidemia:Continue with statin.  Diet: regular dfiet DVT Prophylaxis: subcutaneous Heparin  Advance goals of care discussion: gull code  Family Communication: family was present at bedside, at the time of interview. The pt provided permission to discuss medical plan with the family. Opportunity was given to ask question and all questions were answered satisfactorily.   Disposition:  Discharge to home.  Consultants: nonen Procedures: one  Procedures:   No admission procedures for hospital encounter.     Antimicrobials:  Anti-infectives (From admission, onward)   None       Medication:   amLODipine  5 mg Oral Daily   aspirin EC  81 mg Oral Daily   calcium-vitamin D  1 tablet Oral BID   carvedilol  25 mg Oral BID WC   colchicine  0.6 mg Oral QODAY   enoxaparin (LOVENOX) injection  40 mg Subcutaneous Q24H   furosemide  20 mg Oral Daily   insulin aspart  0-5 Units Subcutaneous QHS   insulin aspart  0-9 Units Subcutaneous TID WC   isosorbide mononitrate  30 mg Oral Daily   levothyroxine  50 mcg Oral  Daily   losartan  100 mg Oral Daily   predniSONE  5 mg Oral Q breakfast   simvastatin  40 mg Oral Daily   sodium chloride flush  3 mL Intravenous Once   vitamin B-12  1,000 mcg Oral Daily    acetaminophen, HYDROcodone-acetaminophen, ondansetron **OR** ondansetron (ZOFRAN) IV     Objective:   Vitals:   07/16/18 1300 07/16/18 2027 07/17/18 0430 07/17/18 0843  BP: 129/61 126/60 111/66 (!) 156/71  Pulse: 91 75 (!) 59 79  Resp: 16 16 13    Temp: 100 F (37.8 C) 98.2 F (36.8 C) 98.9 F (37.2 C)   TempSrc: Oral Oral Oral   SpO2: 91% 97% 95%   Weight:      Height:        Intake/Output Summary (Last 24 hours) at 07/17/2018 1443 Last data filed at 07/17/2018  0300 Gross per 24 hour  Intake 240 ml  Output 750 ml  Net -510 ml   Filed Weights   07/14/18 1416  Weight: 71.7 kg     Examination:    General exam: Appears calm and comfortable  BP (!) 156/71    Pulse 79    Temp 98.9 F (37.2 C) (Oral)    Resp 13    Ht 5\' 5"  (1.651 m)    Wt 71.7 kg    SpO2 95%    BMI 26.29 kg/m    Physical Exam  Constitution:  Alert, cooperative, no distress,  Psychiatric: Normal and stable mood and affect, cognition intact,   HEENT: Normocephalic, PERRL, otherwise with in Normal limits  Chest:Chest symmetric Cardio vascular:  S1/S2, RRR, No murmure, No Rubs or Gallops  pulmonary: Clear to auscultation bilaterally, respirations unlabored, negative wheezes / crackles Abdomen: Soft, non-tender, non-distended, bowel sounds,no masses, no organomegaly Muscular skeletal: Limited exam - in bed, able to move all 4 extremities, Normal strength,  Neuro: CNII-XII intact. , normal motor and sensation, reflexes intact  Extremities: No pitting edema lower extremities, +2 pulses  Skin: Dry, warm to touch, negative for any Rashes, No open wounds Wounds: per nursing documentation  LABs:  CBC Latest Ref Rng & Units 07/15/2018 07/14/2018 07/14/2018  WBC 4.0 - 10.5 K/uL 10.7(H) 10.2 13.0(H)  Hemoglobin 12.0 - 15.0 g/dL 11.0(L) 12.1 13.6  Hematocrit 36.0 - 46.0 % 33.3(L) 36.7 41.2  Platelets 150 - 400 K/uL 140(L) 150 148(L)   CMP Latest Ref Rng & Units 07/15/2018 07/15/2018 07/14/2018  Glucose 70 - 99 mg/dL 191(H) 156(H) -  BUN 8 - 23 mg/dL 10 9 -  Creatinine 0.44 - 1.00 mg/dL 1.21(H) 1.11(H) 1.07(H)  Sodium 135 - 145 mmol/L 137 139 -  Potassium 3.5 - 5.1 mmol/L 4.0 2.8(L) -  Chloride 98 - 111 mmol/L 106 103 -  CO2 22 - 32 mmol/L 25 26 -  Calcium 8.9 - 10.3 mg/dL 9.2 9.3 -  Total Protein 6.5 - 8.1 g/dL - 5.2(L) -  Total Bilirubin 0.3 - 1.2 mg/dL - 1.2 -  Alkaline Phos 38 - 126 U/L - 54 -  AST 15 - 41 U/L - 19 -  ALT 0 - 44 U/L - 13 -

## 2018-07-17 NOTE — Evaluation (Signed)
Physical Therapy Evaluation Patient Details Name: Sheila Morgan MRN: [id number] DOB: Aug 01, 1932 Today's Date: 07/17/2018   History of Present Illness   85 y.o. female with medical history significant of CAD , OA , HTN, osteoporosis, DM , gout, HOH ,Right shoulder radiculopathy, and PMR who was brought in to the ER today after sustaining a mechanical fall at home with complaints of rib pain. Found to have acute fx 9th, healing fxs 7-8th right ribs.   Clinical Impression  Pt presents with moderate limitations to mobility compared to baseline independence with cane at home; presents with pain, weakness, limited energy for sustained activity all resulting in need for assistance with basic mobility skills.  Pt will need postacute PT to address deficits and limitations in order to return home, and as daughter works Warehouse manager and cannot provide sufficient supervision or care for home d/c, pt requires shortterm SNF to meet rehab needs.  PT will initiate care in acute setting and assist in d/c as necessary.       Follow Up Recommendations SNF    Equipment Recommendations  None recommended by PT (has cane and RW at home)   Recommendations for Other Services       Precautions / Restrictions Precautions Precautions: Fall Precaution Comments: bed/chair alarms, use RW  Restrictions Weight Bearing Restrictions: No      Mobility  Bed Mobility Overal bed mobility: Needs Assistance Bed Mobility: Supine to Sit     Supine to sit: Min assist;HOB elevated     General bed mobility comments: assist to raise trunk upright into seated while guarding right ribs with pillow; sits EOB unsupported; needs increased time and coaching to efficiently scoot to EOB, assisted with bedpad to reinforce lateral pelvic tilt to unweight for scooting  Transfers Overall transfer level: Needs assistance Equipment used: Rolling walker (2 wheeled) Transfers: Sit to/from Stand Sit to Stand: Mod assist          General transfer comment: requires cues for optimal foot placement especially standing from lower surface (recliner) vs. bed or elevated toilet seat.  min assist from more elevated surfaces with instruction for optimal timing and placement of hands on RW; stands unsupported short times to wash hands; one mild LOB with min assist to correct early on, with no repeated episodes  Ambulation/Gait Ambulation/Gait assistance: Min guard Gait Distance (Feet): 25 Feet Assistive device: Rolling walker (2 wheeled) Gait Pattern/deviations: Step-through pattern;Decreased stride length;Trunk flexed Gait velocity: slow, unable to measure in small space   General Gait Details: slow, cautious, but able to navigate around bathroom door, and up/down inclined threshold in/out of bathroom unassisted and uncued; minimal signs of pain or discomfort in ribs  Stairs            Wheelchair Mobility    Modified Rankin (Stroke Patients Only)       Balance Overall balance assessment: Mild deficits observed, not formally tested;History of Falls Sitting-balance support: Feet supported;No upper extremity supported Sitting balance-Leahy Scale: Good     Standing balance support: No upper extremity supported;During functional activity Standing balance-Leahy Scale: Fair                               Pertinent Vitals/Pain Pain Assessment: 0-10 Pain Score: 8  Pain Location: Ribs (R) Pain Descriptors / Indicators: Aching;Guarding Pain Intervention(s): Limited activity within patient's tolerance;Monitored during session;Repositioned    Home Living Family/patient expects to be discharged to:: Skilled nursing facility Living Arrangements: Alone  Available Help at Discharge: Family;Friend(s)(daughter can help parttime but works) Type of Home: House Home Access: Level entry     [place]: [name] - single point;Walker - 2 wheels;Shower seat Additional Comments: Pts  daughter reports her and brother checks on her 3x daily. She just started living alone 2 months ago due to niece leaving from residence.     Prior Function Level of Independence: Independent with assistive device(s)         Comments: Pt able to perform ADL tasks with increased time.     Hand Dominance   Dominant Hand: Left    Extremity/Trunk Assessment   Upper Extremity Assessment Upper Extremity Assessment: Defer to OT evaluation    Lower Extremity Assessment Lower Extremity Assessment: Generalized weakness       Communication   Communication: HOH  Cognition Arousal/Alertness: Awake/alert Behavior During Therapy: WFL for tasks assessed/performed Overall Cognitive Status: Within Functional Limits for tasks assessed                                 General Comments: pt takes some incr time to answer questions, appears thoughtful and answers are confirmed by daughter      General Comments General comments (skin integrity, edema, etc.): educated on use of pillow to brace ribs, pt demonstrates but does not seem to need much    Exercises     Assessment/Plan    PT Assessment Patient needs continued PT services  PT Problem List Pain;Decreased knowledge of use of DME;Decreased mobility;Decreased activity tolerance;Decreased strength       PT Treatment Interventions Patient/family education;Therapeutic exercise;Therapeutic activities;Functional mobility training;Gait training;DME instruction    PT Goals (Current goals can be found in the Care Plan section)  Acute Rehab PT Goals Patient Stated Goal: to return home PT Goal Formulation: With patient/family Time For Goal Achievement: 07/31/18 Potential to Achieve Goals: Good    Frequency Min 2X/week   Barriers to discharge Decreased caregiver support lives alone, daughter works parttime    Co-evaluation PT/OT/SLP Co-Evaluation/Treatment: Yes Reason for Co-Treatment: For Doctor, hospital;To  address functional/ADL transfers PT goals addressed during session: Mobility/safety with mobility;Proper use of DME         AM-PAC PT "6 Clicks" Mobility  Outcome Measure Help needed turning from your back to your side while in a flat bed without using bedrails?: A Little Help needed moving from lying on your back to sitting on the side of a flat bed without using bedrails?: A Lot Help needed moving to and from a bed to a chair (including a wheelchair)?: A Little Help needed standing up from a chair using your arms (e.g., wheelchair or bedside chair)?: A Little Help needed to walk in hospital room?: A Little Help needed climbing 3-5 steps with a railing? : A Lot 6 Click Score: 16    End of Session   Activity Tolerance: Patient tolerated treatment well Patient left: in chair;with call bell/phone within reach;with chair alarm set;with family/visitor present Nurse Communication: Mobility status PT Visit Diagnosis: History of falling (Z91.81);Pain;Difficulty in walking, not elsewhere classified (R26.2) Pain - Right/Left: Right Pain - part of body: (torso)    Time: 4818-5631 PT Time Calculation (min) (ACUTE ONLY): 60 min   Charges:   PT Evaluation $PT Eval Low Complexity: 1 Low PT Treatments $Gait Training: 8-22 mins $Therapeutic Activity: 8-22 mins        Kearney Hard, PT, DPT, MS Board Certified  Geriatric Clinical Specialist  Kearney Hard [facility] 07/17/2018, 1:46 PM

## 2018-07-17 NOTE — Plan of Care (Signed)

## 2018-07-17 NOTE — Plan of Care (Signed)
  Problem: Pain Managment: Goal: General experience of comfort will improve Outcome: Progressing   Problem: Safety: Goal: Ability to remain free from injury will improve Outcome: Progressing   Problem: Skin Integrity: Goal: Risk for impaired skin integrity will decrease Outcome: Progressing   

## 2018-07-18 ENCOUNTER — Encounter (HOSPITAL_COMMUNITY): Payer: Self-pay | Admitting: *Deleted

## 2018-07-18 LAB — GLUCOSE, CAPILLARY
Glucose-Capillary: 105 mg/dL — ABNORMAL HIGH (ref 70–99)
Glucose-Capillary: 183 mg/dL — ABNORMAL HIGH (ref 70–99)
Glucose-Capillary: 150 mg/dL — ABNORMAL HIGH (ref 70–99)
Glucose-Capillary: 147 mg/dL — ABNORMAL HIGH (ref 70–99)

## 2018-07-18 NOTE — [place] FL2 (Signed)
[place] MEDICAID FL2 LEVEL OF CARE SCREENING TOOL     IDENTIFICATION  Patient Name: Sheila Morgan Birthdate: 22-Sep-1932 Sex: female Admission Date (Current Location): 07/14/2018  [facility] [place] and [place] Number:  Herbalist and Address:  [facility], [place] [address], [place], [place] [address]      Provider Number: [id number]  Attending Physician Name and Address:  Bonnell Public, MD  Relative Name and Phone Number:  [name] (daughter) [phone]    Current Level of Care: Hospital Recommended Level of Care: [place] Prior Approval Number:    Date Approved/Denied: 10/10/13 PASRR Number: [id number]  Discharge Plan: SNF    Current Diagnoses: Patient Active Problem List   Diagnosis Date Noted  . Fall 07/14/2018  . Fever, unspecified 07/14/2018  . Hearing loss 01/05/2018  . Memory difficulties 01/05/2018  . Multiple closed fractures of ribs of right side 01/19/2017  . Acute pain of right shoulder 01/05/2017  . Rib pain on right side 01/05/2017  . Acute pain of right knee 01/05/2017  . B12 deficiency 06/13/2015  . Hypothyroidism 06/13/2015  . DM (diabetes mellitus) type II, controlled, with peripheral vascular disorder ([place]) 03/06/2014  . Gout 10/15/2013  . Arthritis   . Hiatal hernia   . Coronary artery disease   . Left knee DJD   . Benign paroxysmal positional vertigo 05/19/2013  . Weakness generalized 05/09/2013  . Pseudogout of left knee 03/29/2013  . Acute right lumbar radiculopathy 03/18/2013  . Shoulder impingement syndrome 01/25/2013  . Vertigo 10/17/2012  . Renal insufficiency, mild 06/20/2011  . Essential hypertension 12/31/2009  . Osteoporosis 12/31/2009  . SKIN CANCER, HX OF 12/31/2009  . Proteinuria 01/05/2008  . UNSPECIFIED ANEMIA 12/21/2007  . Hyperlipidemia 09/08/2006  . Polymyalgia rheumatica ([place]) 09/08/2006  . CORONARY ARTERY BYPASS GRAFT, HX OF 09/08/2006    Orientation RESPIRATION  BLADDER Height & Weight     Self, Time, Situation, Place  Normal Incontinent, External catheter Weight: 158 lb (71.7 kg) Height:  5\' 5"  (165.1 cm)  BEHAVIORAL SYMPTOMS/MOOD NEUROLOGICAL BOWEL NUTRITION STATUS      Incontinent Diet(see discharge summary)  AMBULATORY STATUS COMMUNICATION OF NEEDS Skin   Limited Assist Verbally Other (Comment)(ecchymosis arms and chest)                       Personal Care Assistance Level of Assistance  Bathing, Feeding, Dressing, Total care Bathing Assistance: Limited assistance Feeding assistance: Independent Dressing Assistance: Limited assistance Total Care Assistance: Limited assistance   Functional Limitations Info  Sight, Hearing, Speech Sight Info: Impaired Hearing Info: Impaired Speech Info: Adequate    SPECIAL CARE FACTORS FREQUENCY  PT (By licensed PT), OT (By licensed OT)     PT Frequency: min 5x weekly OT Frequency: min 5x weekly            Contractures Contractures Info: Not present    Additional Factors Info  Code Status, Allergies Code Status Info: full Allergies Info: No Known Allergies           Current Medications (07/18/2018):  This is the current hospital active medication list Current Facility-Administered Medications  Medication Dose Route Frequency Provider Last Rate Last Dose  . acetaminophen (TYLENOL) tablet 650 mg  650 mg Oral Q6H PRN Lavina Hamman, MD   650 mg at 07/16/18 1508  . amLODipine (NORVASC) tablet 5 mg  5 mg Oral Daily Elwyn Reach, MD   5 mg at 07/18/18 0902  .  aspirin EC tablet 81 mg  81 mg Oral Daily Elwyn Reach, MD   81 mg at 07/18/18 0901  . calcium-vitamin D (OSCAL WITH D) 500-200 MG-UNIT per tablet 1 tablet  1 tablet Oral BID Elwyn Reach, MD   1 tablet at 07/18/18 0902  . carvedilol (COREG) tablet 25 mg  25 mg Oral BID WC Gala Romney L, MD   25 mg at 07/18/18 0901  . colchicine tablet 0.6 mg  0.6 mg Oral QODAY Garba, Mohammad L, MD   0.6 mg at 07/17/18 0830  .  enoxaparin (LOVENOX) injection 40 mg  40 mg Subcutaneous Q24H Gala Romney L, MD   40 mg at 07/17/18 2244  . furosemide (LASIX) tablet 20 mg  20 mg Oral Daily Elwyn Reach, MD   20 mg at 07/18/18 0902  . HYDROcodone-acetaminophen (NORCO/VICODIN) 5-325 MG per tablet 1 tablet  1 tablet Oral Q6H PRN Lavina Hamman, MD   1 tablet at 07/17/18 2243  . insulin aspart (novoLOG) injection 0-5 Units  0-5 Units Subcutaneous QHS Garba, Mohammad L, MD      . insulin aspart (novoLOG) injection 0-9 Units  0-9 Units Subcutaneous TID WC Elwyn Reach, MD   2 Units at 07/17/18 1653  . isosorbide mononitrate (IMDUR) 24 hr tablet 30 mg  30 mg Oral Daily Elwyn Reach, MD   30 mg at 07/18/18 0902  . levothyroxine (SYNTHROID, LEVOTHROID) tablet 50 mcg  50 mcg Oral Daily Elwyn Reach, MD   50 mcg at 07/18/18 0902  . losartan (COZAAR) tablet 100 mg  100 mg Oral Daily Gala Romney L, MD   100 mg at 07/18/18 0903  . ondansetron (ZOFRAN) tablet 4 mg  4 mg Oral Q6H PRN Elwyn Reach, MD       Or  . ondansetron (ZOFRAN) injection 4 mg  4 mg Intravenous Q6H PRN Gala Romney L, MD      . predniSONE (DELTASONE) tablet 5 mg  5 mg Oral Q breakfast Elwyn Reach, MD   5 mg at 07/18/18 0902  . simvastatin (ZOCOR) tablet 40 mg  40 mg Oral Daily Elwyn Reach, MD   40 mg at 07/18/18 0903  . sodium chloride flush (NS) 0.9 % injection 3 mL  3 mL Intravenous Once Gala Romney L, MD      . vitamin B-12 (CYANOCOBALAMIN) tablet 1,000 mcg  1,000 mcg Oral Daily Elwyn Reach, MD   1,000 mcg at 07/18/18 0902     Discharge Medications: Please see discharge summary for a list of discharge medications.  Relevant Imaging Results:  Relevant Lab Results:   Additional Information SSN: [id number]  Alberteen Sam, LCSW

## 2018-07-18 NOTE — Progress Notes (Signed)
PROGRESS NOTE    Patient: Sheila Morgan                            PCP: Binnie Rail, MD                    DOB: Jul 31, 1932            DOA: 07/14/2018 QQP:[id number]             DOS: 07/18/2018, 11:41 AM   Date of Service: The patient was seen and examined on 07/18/2018  Subjective:   Patient was seen.  No new complaints.  Rib pain is well controlled.  Patient is awaiting discharge to skilled nursing facility.   Brief Narrative:   Patient is an 85 year old Caucasian female, with past medical history significant for coronary artery disease, osteoarthritis, hypertension, diabetes mellitus, gout and PMR.  Patient presented with rib pain following a fall.  Work-up revealed rib fracture.  Patient's rib pain has been optimized.  Patient is awaiting discharge to skilled nursing facility.  Principal Problem:   Fall Active Problems:   Hyperlipidemia   Essential hypertension   CORONARY ARTERY BYPASS GRAFT, HX OF   Weakness generalized   DM (diabetes mellitus) type II, controlled, with peripheral vascular disorder (HCC)   Hypothyroidism   Multiple closed fractures of ribs of right side   Hearing loss   Fever, unspecified    Assessment & Plan:     Status post fall: -Rib pain has improved significantly. -Patient is awaiting discharge to skilled nursing facility for subacute rehabilitation. -Input from physical therapy and Occupational Therapy is appreciated. -Patient is requiring 2 people assist with her normal ADL -Possibly discharge to skilled nursing facility.   Multiple rib fractures: Pain control and incentive spirometry will be deployed. 07/17/2018: Pain control is optimized.  Hypertension: Continue blood pressure medications from home. Optimized.  Diabetes: continue home regimen with sliding scale insulin. 07/17/2018: Continue to monitor closely.  Hypothyroidism:  Continue levothyroxine.  History of coronary artery disease:  Stable at this point.    Hyperlipidemia: Continue with statin.  Diet: regular dfiet DVT Prophylaxis: subcutaneous Heparin  Advance goals of care discussion: full code  Family Communication:  Disposition:  Discharge to  skilled nursing facility  Consultants: none   Procedures:   No admission procedures for hospital encounter.     Antimicrobials:  Anti-infectives (From admission, onward)   None       Medication:  . amLODipine  5 mg Oral Daily  . aspirin EC  81 mg Oral Daily  . calcium-vitamin D  1 tablet Oral BID  . carvedilol  25 mg Oral BID WC  . colchicine  0.6 mg Oral QODAY  . enoxaparin (LOVENOX) injection  40 mg Subcutaneous Q24H  . furosemide  20 mg Oral Daily  . insulin aspart  0-5 Units Subcutaneous QHS  . insulin aspart  0-9 Units Subcutaneous TID WC  . isosorbide mononitrate  30 mg Oral Daily  . levothyroxine  50 mcg Oral Daily  . losartan  100 mg Oral Daily  . predniSONE  5 mg Oral Q breakfast  . simvastatin  40 mg Oral Daily  . sodium chloride flush  3 mL Intravenous Once  . vitamin B-12  1,000 mcg Oral Daily    acetaminophen, HYDROcodone-acetaminophen, ondansetron **OR** ondansetron (ZOFRAN) IV     Objective:   Vitals:   07/17/18 0843 07/17/18 1620 07/17/18 1952 07/18/18 7124  BP: (!) 156/71 117/70 127/67 125/61  Pulse: 79 84 76 76  Resp:  20 16 16   Temp:  98.7 F (37.1 C) 98.4 F (36.9 C) 98.8 F (37.1 C)  TempSrc:  Oral Oral Oral  SpO2:  97% 98% 97%  Weight:      Height:        Intake/Output Summary (Last 24 hours) at 07/18/2018 1141 Last data filed at 07/18/2018 0900 Gross per 24 hour  Intake 120 ml  Output 525 ml  Net -405 ml   Filed Weights   07/14/18 1416  Weight: 71.7 kg     Examination:    General exam: Appears calm and comfortable  BP 125/61 (BP Location: Right Arm)   Pulse 76   Temp 98.8 F (37.1 C) (Oral)   Resp 16   Ht 5\' 5"  (1.651 m)   Wt 71.7 kg   SpO2 97%   BMI 26.29 kg/m    Physical Exam  Constitution:  Alert,  cooperative, no distress,  HEENT: Normocephalic, PERRL Cardio vascular:  S1/S2 pulmonary: Clear to auscultation  Abdomen: Soft, non-tender, non-distended, bowel sounds,no masses, no organomegaly  Neuro: Awake and alert.  Patient moves all limbs.  Extremities: No pitting edema lower extremities, +2 pulses    LABs:  CBC Latest Ref Rng & Units 07/15/2018 07/14/2018 07/14/2018  WBC 4.0 - 10.5 K/uL 10.7(H) 10.2 13.0(H)  Hemoglobin 12.0 - 15.0 g/dL 11.0(L) 12.1 13.6  Hematocrit 36.0 - 46.0 % 33.3(L) 36.7 41.2  Platelets 150 - 400 K/uL 140(L) 150 148(L)   CMP Latest Ref Rng & Units 07/15/2018 07/15/2018 07/14/2018  Glucose 70 - 99 mg/dL 191(H) 156(H) -  BUN 8 - 23 mg/dL 10 9 -  Creatinine 0.44 - 1.00 mg/dL 1.21(H) 1.11(H) 1.07(H)  Sodium 135 - 145 mmol/L 137 139 -  Potassium 3.5 - 5.1 mmol/L 4.0 2.8(L) -  Chloride 98 - 111 mmol/L 106 103 -  CO2 22 - 32 mmol/L 25 26 -  Calcium 8.9 - 10.3 mg/dL 9.2 9.3 -  Total Protein 6.5 - 8.1 g/dL - 5.2(L) -  Total Bilirubin 0.3 - 1.2 mg/dL - 1.2 -  Alkaline Phos 38 - 126 U/L - 54 -  AST 15 - 41 U/L - 19 -  ALT 0 - 44 U/L - 13 -

## 2018-07-18 NOTE — Progress Notes (Signed)
Occupational Therapy Treatment Patient Details Name: Sheila Morgan MRN: [id number] DOB: 1932/06/01 Today's Date: 07/18/2018    History of present illness  85 y.o. female with medical history significant of CAD , OA , HTN, osteoporosis, DM , gout, HOH ,Right shoulder radiculopathy, and PMR who was brought in to the ER today after sustaining a mechanical fall at home with complaints of rib pain.   OT comments  Pt. Progressing slowly toward OT goals. Pt received in bed upon arrival. Pt able to demonstrate bed mobility with Min A with increased strength power up trunk to seated position. Pt required min A to assist LE and scoot pad to position to safe seated position at EOB. Simulated toilet transfer from bed to chair with RW Min A. Pt educated on compensatory strategy for UB dressing to decrease pain with less movement of BUE. Pt required Min A in two attempts to position LUE into sleeve due to decreased ROM of L shd. Pt DC and frequency still remains appropriate. OT will continue to follow acutely.   Follow Up Recommendations  SNF;Follow surgeon's recommendation for DC plan and follow-up therapies;Supervision/Assistance - 24 hour    Equipment Recommendations  3 in 1 bedside commode    Recommendations for Other Services      Precautions / Restrictions Precautions Precautions: Fall Precaution Comments: bed/chair alarms, use RW  Restrictions Weight Bearing Restrictions: No       Mobility Bed Mobility Overal bed mobility: Needs Assistance Bed Mobility: Supine to Sit     Supine to sit: Min assist;HOB elevated     General bed mobility comments: increased strength to raise trunk upright into seated position.; sits EOB unsupported; needs increased time and coaching to efficiently scoot to EOB, assisted with bedpad to reinforce lateral pelvic tilt to unweight for scooting  Transfers Overall transfer level: Needs assistance Equipment used: Rolling walker (2 wheeled) Transfers: Sit  to/from Stand Sit to Stand: Min assist         General transfer comment: requires cues for optimal foot placement especially standing from lower surface (recliner) vs. bed or elevated toilet seat.  min assist from more elevated surfaces with instruction for optimal timing and placement of hands on RW; stands unsupported short times to wash hands; one mild LOB with min assist to correct early on, with no repeated episodes    Balance Overall balance assessment: Mild deficits observed, not formally tested;History of Falls Sitting-balance support: Feet supported;No upper extremity supported Sitting balance-Leahy Scale: Good     Standing balance support: No upper extremity supported;During functional activity Standing balance-Leahy Scale: Fair                             ADL either performed or assessed with clinical judgement   ADL Overall ADL's : Needs assistance/impaired Eating/Feeding: Independent   Grooming: Set up;Standing;Brushing hair       Lower Body Bathing: (Pt stood by [facility] over toilet with assist for toileting hygien)   Upper Body Dressing : Minimal assistance Upper Body Dressing Details (indicate cue type and reason): Pt educated off compensatory strategy to don/ doff overhead shirt with disposible scrub top. Pt engaged in tasks 2 times. required assisted with LE due to limited ROM of shoulder. Pt reports less pain of rib with technique of donning shirt. Next session to possiblly teach caregiver or family member.      Toilet Transfer: Minimal Print production planner Details (indicate cue type and reason): Min  A sit to stand, Min A transfer.         Functional mobility during ADLs: Minimal assistance General ADL Comments: Education of UB dressing.     Vision       Perception     Praxis      Cognition Arousal/Alertness: Awake/alert Behavior During Therapy: WFL for tasks assessed/performed Overall Cognitive Status: Within Functional Limits for  tasks assessed                                 General Comments: pt takes some incr time to answer questions, appears thoughtful and answers are confirmed by daughter        Exercises     Shoulder Instructions       General Comments educated on UE dressing with over head shirt.    Pertinent Vitals/ Pain       Pain Assessment: Faces Faces Pain Scale: Hurts a little bit Pain Location: Ribs (R) Pain Descriptors / Indicators: Aching;Guarding Pain Intervention(s): Monitored during session;Repositioned  Home Living                                          Prior Functioning/Environment              Frequency  Min 2X/week        Progress Toward Goals  OT Goals(current goals can now be found in the care plan section)  Progress towards OT goals: Progressing toward goals  Acute Rehab OT Goals Patient Stated Goal: to return home OT Goal Formulation: With family Time For Goal Achievement: 07/31/18 Potential to Achieve Goals: Good ADL Goals Pt Will Transfer to Toilet: with modified independence;bedside commode;ambulating Pt Will Perform Toileting - Clothing Manipulation and hygiene: with modified independence;sit to/from stand Pt Will Perform Tub/Shower Transfer: Tub transfer;3 in 1;rolling walker;ambulating;with min guard assist  Plan Discharge plan remains appropriate;Frequency remains appropriate    Co-evaluation                 AM-PAC OT "6 Clicks" Daily Activity     Outcome Measure   Help from another person eating meals?: None Help from another person taking care of personal grooming?: A Little Help from another person toileting, which includes using toliet, bedpan, or urinal?: A Little Help from another person bathing (including washing, rinsing, drying)?: A Lot Help from another person to put on and taking off regular upper body clothing?: A Little Help from another person to put on and taking off regular lower body  clothing?: A Little 6 Click Score: 18    End of Session Equipment Utilized During Treatment: Gait belt;Rolling walker  OT Visit Diagnosis: Unsteadiness on feet (R26.81);Muscle weakness (generalized) (M62.81);Pain Pain - Right/Left: Right Pain - part of body: (Ribs)   Activity Tolerance Patient tolerated treatment well(some pain of rib.)   Patient Left in chair;with call bell/phone within reach;with chair alarm set   Nurse Communication Mobility status;Weight bearing status        Time: 9826-4158 OT Time Calculation (min): 26 min  Charges: OT General Charges $OT Visit: 1 Visit OT Treatments $Self Care/Home Management : 23-37 mins  Minus Breeding, MSOT, OTR/L  Supplemental Rehabilitation Services  [phone]   Marius Ditch 07/18/2018, 2:35 PM

## 2018-07-18 NOTE — Progress Notes (Addendum)
CSW received two SNF bed denials from preferences of [facility] and [facility].   Facilities concerned regarding patient's slight fever of 100 on Saturday and last MD's note stating fever, unspecified.   CSW will continue to fax out for possible bed offers. Pending response from [facility].   Update 2:30 pm: [facility] can accept tomorrow 3/17. MD aware and lvm with daughter to update.   [place], [place]

## 2018-07-18 NOTE — TOC Initial Note (Signed)
Transition of Care [facility]) - Initial/Assessment Note    Patient Details  Name: Sheila Morgan MRN: [id number] Date of Birth: 04/11/1933  [facility]) CM/SW Contact:    Alberteen Sam, LCSW Phone Number: 07/18/2018, 10:46 AM  Clinical Narrative:               CSW met with patient with daughter at bedside. Patient gave permission to speak with daughter Sheila Morgan at bedside regarding discharge plan. Sheila Morgan reports she does not want [facility] as her father passed away there, Sheila Morgan states she is unaware of what options she has in the area for SNF placement for patient. CSW provided medicare.gov list in which patient's daughter Sheila Morgan will choose top 3 choices for SNF placement for short term rehab. Sheila Morgan and patient report acknowledging and understanding recommendation for PT and are in agreement. CSW to follow up this afternoon to get daughter's choices and proceed to identify potential bed offers. Medicare insurance process explained to patient and family.       Expected Discharge Plan: Skilled Nursing Facility Barriers to Discharge: No Barriers Identified   Patient Goals and CMS Choice Patient states their goals for this hospitalization and ongoing recovery are:: to go to rehab then back home CMS Medicare.gov Compare Post Acute Care list provided to:: Patient Represenative (must comment)(daughter Sheila Morgan at bedside) Choice offered to / list presented to : Adult Children  Expected Discharge Plan and Services Expected Discharge Plan: [place] Discharge Planning Services: NA Post Acute Care Choice: [place] Living arrangements for the past 2 months: Single Family Home                 DME Arranged: N/A DME Agency: NA HH Arranged: NA HH Agency: NA  Prior Living Arrangements/Services Living arrangements for the past 2 months: [place] Lives with:: Self Patient language and need for interpreter reviewed:: Yes Do you feel safe going  back to the place where you live?: No   patient would benefit from skilled nursign PT to gain mobility before returning home alone  Need for Family Participation in Patient Care: Yes (Comment) Care giver support system in place?: Yes (comment)   Criminal Activity/Legal Involvement Pertinent to Current Situation/Hospitalization: No - Comment as needed  Activities of Daily Living Home Assistive Devices/Equipment: Cane (specify quad or straight) ADL Screening (condition at time of admission) Patient's cognitive ability adequate to safely complete daily activities?: Yes Is the patient deaf or have difficulty hearing?: Yes Does the patient have difficulty seeing, even when wearing glasses/contacts?: No Does the patient have difficulty concentrating, remembering, or making decisions?: Yes Patient able to express need for assistance with ADLs?: Yes Does the patient have difficulty dressing or bathing?: Yes Independently performs ADLs?: Yes (appropriate for developmental age) Does the patient have difficulty walking or climbing stairs?: Yes Weakness of Legs: Both Weakness of Arms/Hands: Left  Permission Sought/Granted Permission sought to share information with : Case Manager, Family Supports, Customer service manager Permission granted to share information with : Yes, Verbal Permission Granted  Share Information with NAME: Sheila Morgan  Permission granted to share info w AGENCY: SNFs  Permission granted to share info w Relationship: daughter  Permission granted to share info w Contact Information: [phone]  Emotional Assessment Appearance:: Appears stated age Attitude/Demeanor/Rapport: Gracious Affect (typically observed): Accepting Orientation: : Oriented to Self, Oriented to Place, Oriented to  Time, Oriented to Situation Alcohol / Substance Use: Not Applicable Psych Involvement: No (comment)  Admission diagnosis:  Fever  of unknown origin [R50.9] Fall, initial encounter  [W19.XXXA] Closed fracture of one rib of right side, initial encounter [S22.31XA] Patient Active Problem List   Diagnosis Date Noted  . Fall 07/14/2018  . Fever, unspecified 07/14/2018  . Hearing loss 01/05/2018  . Memory difficulties 01/05/2018  . Multiple closed fractures of ribs of right side 01/19/2017  . Acute pain of right shoulder 01/05/2017  . Rib pain on right side 01/05/2017  . Acute pain of right knee 01/05/2017  . B12 deficiency 06/13/2015  . Hypothyroidism 06/13/2015  . DM (diabetes mellitus) type II, controlled, with peripheral vascular disorder ([place]) 03/06/2014  . Gout 10/15/2013  . Arthritis   . Hiatal hernia   . Coronary artery disease   . Left knee DJD   . Benign paroxysmal positional vertigo 05/19/2013  . Weakness generalized 05/09/2013  . Pseudogout of left knee 03/29/2013  . Acute right lumbar radiculopathy 03/18/2013  . Shoulder impingement syndrome 01/25/2013  . Vertigo 10/17/2012  . Renal insufficiency, mild 06/20/2011  . Essential hypertension 12/31/2009  . Osteoporosis 12/31/2009  . SKIN CANCER, HX OF 12/31/2009  . Proteinuria 01/05/2008  . UNSPECIFIED ANEMIA 12/21/2007  . Hyperlipidemia 09/08/2006  . Polymyalgia rheumatica ([place]) 09/08/2006  . CORONARY ARTERY BYPASS GRAFT, HX OF 09/08/2006   PCP:  Binnie Rail, MD Pharmacy:   [facility] [place] [place] [address] Phone: [phone] Fax: [phone]     Social Determinants of Health (SDOH) Interventions    Readmission Risk Interventions 30 Day Unplanned Readmission Risk Score     ED to Hosp-Admission (Current) from 07/14/2018 in [place]  30 Day Unplanned Readmission Risk Score (%)  14 Filed at 07/18/2018 0801     This score is the patient's risk of an unplanned readmission within 30 days of being discharged (0 -100%). The score is  based on dignosis, age, lab data, medications, orders, and past utilization.   Low:  0-14.9   Medium: 15-21.9   High: 22-29.9   Extreme: 30 and above       No flowsheet data found.

## 2018-07-19 DIAGNOSIS — H8193 Unspecified disorder of vestibular function, bilateral: Secondary | ICD-10-CM | POA: Diagnosis not present

## 2018-07-19 DIAGNOSIS — M6281 Muscle weakness (generalized): Secondary | ICD-10-CM | POA: Diagnosis not present

## 2018-07-19 DIAGNOSIS — R278 Other lack of coordination: Secondary | ICD-10-CM | POA: Diagnosis not present

## 2018-07-19 DIAGNOSIS — M810 Age-related osteoporosis without current pathological fracture: Secondary | ICD-10-CM | POA: Diagnosis not present

## 2018-07-19 DIAGNOSIS — Z7401 Bed confinement status: Secondary | ICD-10-CM | POA: Diagnosis not present

## 2018-07-19 DIAGNOSIS — I10 Essential (primary) hypertension: Secondary | ICD-10-CM | POA: Diagnosis not present

## 2018-07-19 DIAGNOSIS — E1151 Type 2 diabetes mellitus with diabetic peripheral angiopathy without gangrene: Secondary | ICD-10-CM | POA: Diagnosis not present

## 2018-07-19 DIAGNOSIS — R2689 Other abnormalities of gait and mobility: Secondary | ICD-10-CM | POA: Diagnosis not present

## 2018-07-19 DIAGNOSIS — M2550 Pain in unspecified joint: Secondary | ICD-10-CM | POA: Diagnosis not present

## 2018-07-19 DIAGNOSIS — S2231XS Fracture of one rib, right side, sequela: Secondary | ICD-10-CM | POA: Diagnosis not present

## 2018-07-19 DIAGNOSIS — R1311 Dysphagia, oral phase: Secondary | ICD-10-CM | POA: Diagnosis not present

## 2018-07-19 DIAGNOSIS — M1049 Other secondary gout, multiple sites: Secondary | ICD-10-CM | POA: Diagnosis not present

## 2018-07-19 DIAGNOSIS — R52 Pain, unspecified: Secondary | ICD-10-CM | POA: Diagnosis not present

## 2018-07-19 DIAGNOSIS — S2241XS Multiple fractures of ribs, right side, sequela: Secondary | ICD-10-CM | POA: Diagnosis not present

## 2018-07-19 DIAGNOSIS — S2231XA Fracture of one rib, right side, initial encounter for closed fracture: Secondary | ICD-10-CM | POA: Diagnosis not present

## 2018-07-19 DIAGNOSIS — H9193 Unspecified hearing loss, bilateral: Secondary | ICD-10-CM | POA: Diagnosis not present

## 2018-07-19 DIAGNOSIS — I25810 Atherosclerosis of coronary artery bypass graft(s) without angina pectoris: Secondary | ICD-10-CM | POA: Diagnosis not present

## 2018-07-19 DIAGNOSIS — E119 Type 2 diabetes mellitus without complications: Secondary | ICD-10-CM | POA: Diagnosis not present

## 2018-07-19 DIAGNOSIS — I959 Hypotension, unspecified: Secondary | ICD-10-CM | POA: Diagnosis not present

## 2018-07-19 DIAGNOSIS — M353 Polymyalgia rheumatica: Secondary | ICD-10-CM | POA: Diagnosis not present

## 2018-07-19 DIAGNOSIS — R41841 Cognitive communication deficit: Secondary | ICD-10-CM | POA: Diagnosis not present

## 2018-07-19 DIAGNOSIS — R42 Dizziness and giddiness: Secondary | ICD-10-CM | POA: Diagnosis not present

## 2018-07-19 DIAGNOSIS — W19XXXD Unspecified fall, subsequent encounter: Secondary | ICD-10-CM | POA: Diagnosis not present

## 2018-07-19 LAB — GLUCOSE, CAPILLARY
Glucose-Capillary: 108 mg/dL — ABNORMAL HIGH (ref 70–99)
Glucose-Capillary: 162 mg/dL — ABNORMAL HIGH (ref 70–99)

## 2018-07-19 LAB — CULTURE, BLOOD (ROUTINE X 2)
Culture: NO GROWTH
Culture: NO GROWTH

## 2018-07-19 MED ORDER — HYDROCODONE-ACETAMINOPHEN 5-325 MG PO TABS: 1.0000 | ORAL_TABLET | Freq: Four times a day (QID) | ORAL | 0 refills | Status: DC | PRN

## 2018-07-19 MED ORDER — ACETAMINOPHEN 325 MG PO TABS: 650.0000 mg | ORAL_TABLET | Freq: Four times a day (QID) | ORAL | Status: DC | PRN

## 2018-07-19 NOTE — Discharge Summary (Signed)
Physician Discharge Summary   Patient ID: Sheila Morgan MRN: [id number] DOB/AGE: [birth year] 85 y.o.  Admit date: 07/14/2018 Discharge date: 07/19/2018  Primary Care Physician:  Binnie Rail, MD   Recommendations for Outpatient Follow-up:  1. Follow up with PCP in 2 weeks  Home Health: Patient being discharged to skilled nursing facility Equipment/Devices:   Discharge Condition: stable  CODE STATUS: FULL Diet recommendation: Carb modified diet   Discharge Diagnoses:    Marland Kitchen Mechanical fall . Multiple closed fractures of ribs of right side . Hypothyroidism . DM (diabetes mellitus) type II, controlled, with peripheral vascular disorder ([place]) . Essential hypertension . Hyperlipidemia    Consults: None    Allergies:  No Known Allergies   DISCHARGE MEDICATIONS: Allergies as of 07/19/2018   No Known Allergies     Medication List    TAKE these medications   acetaminophen 325 MG tablet Commonly known as:  TYLENOL Take 2 tablets (650 mg total) by mouth every 6 (six) hours as needed for mild pain, fever or headache.   alendronate 70 MG tablet Commonly known as:  FOSAMAX Take 70 mg by mouth every Saturday. Take with a full glass of water on an empty stomach.   amLODipine 5 MG tablet Commonly known as:  NORVASC TAKE 1 TABLET(5 MG) BY MOUTH DAILY What changed:  See the new instructions.   aspirin 81 MG tablet Take 81 mg by mouth daily.   CALCIUM 600-D PO Take 1 tablet by mouth 2 (two) times daily.   carvedilol 25 MG tablet Commonly known as:  COREG TAKE 1 TABLET(25 MG) BY MOUTH TWICE DAILY WITH A MEAL What changed:  See the new instructions.   colchicine 0.6 MG tablet Commonly known as:  Colcrys Take 1 tablet (0.6 mg total) by mouth every other day.   furosemide 20 MG tablet Commonly known as:  LASIX TAKE 1 TABLET(20 MG) BY MOUTH DAILY What changed:  See the new instructions.   glucose blood test strip Commonly known as:  ONE TOUCH ULTRA TEST Use to  test blood sugar twice daily.. DX E11.51   HYDROcodone-acetaminophen 5-325 MG tablet Commonly known as:  NORCO/VICODIN Take 1 tablet by mouth every 6 (six) hours as needed for severe pain.   isosorbide mononitrate 30 MG 24 hr tablet Commonly known as:  IMDUR Take 1 tablet (30 mg total) by mouth daily.   levothyroxine 50 MCG tablet Commonly known as:  SYNTHROID, LEVOTHROID TAKE 1 TABLET(50 MCG) BY MOUTH DAILY What changed:  See the new instructions.   losartan 100 MG tablet Commonly known as:  COZAAR TAKE 1 TABLET(100 MG) BY MOUTH DAILY What changed:  See the new instructions.   metFORMIN 500 MG tablet Commonly known as:  GLUCOPHAGE TAKE 1/2 TABLET BY MOUTH DAILY WITH LARGEST MEAL What changed:  See the new instructions.   ONE TOUCH ULTRA MINI w/Device Kit Use to test blood sugar twice daily ICD 10 E11 21   OneTouch Delica Lancets 46K Misc TEST BLOOD SUGAR TWICE DAILY   predniSONE 5 MG tablet Commonly known as:  DELTASONE Take 5 mg by mouth daily.   simvastatin 40 MG tablet Commonly known as:  ZOCOR TAKE 1 TABLET(40 MG) BY MOUTH AT BEDTIME What changed:  See the new instructions.   vitamin B-12 1000 MCG tablet Commonly known as:  CYANOCOBALAMIN Take 1,000 mcg by mouth daily.        Brief H and P: For complete details please refer to admission H and P, but in  brief Patient is an 85 year old Caucasian female, with past medical history significant for coronary artery disease, osteoarthritis, hypertension, diabetes mellitus, gout and PMR.  Patient presented with rib pain following a fall.  Work-up revealed rib fracture.  Patient's rib pain has been optimized.  Patient is awaiting discharge to skilled nursing facility.  Hospital Course:   Status post mechanical fall -Right ribs and chest x-ray showed acute nondisplaced fracture of the anterior right ninth rib, healing fractures of anterior right seventh and eighth ribs. -PT recommended skilled nursing facility -Rib  pain has improved significantly  Multiple rib fractures -Continue pain control, incentive spirometer  Essential hypertension -Currently BP stable   Diabetes mellitus, type II, non-insulin-dependent CBGs stable, continue metformin, may need to adjust dose  Hypothyroidism:  Continue levothyroxine.  History of coronary artery disease:  Stable at this point.   Hyperlipidemia: Continue with statin.   Day of Discharge S: Rib pain controlled, no acute issues overnight, no fevers.   BP (!) 141/76 (BP Location: Right Arm)   Pulse 72   Temp 98.2 F (36.8 C) (Oral)   Resp 14   Ht 5' 5"  (1.651 m)   Wt 71.7 kg   SpO2 98%   BMI 26.29 kg/m   Physical Exam: General: Alert and awake oriented x3 not in any acute distress. HEENT: anicteric sclera, pupils reactive to light and accommodation CVS: S1-S2 clear no murmur rubs or gallops Chest: clear to auscultation bilaterally, no wheezing rales or rhonchi Abdomen: soft nontender, nondistended, normal bowel sounds Extremities: no cyanosis, clubbing or edema noted bilaterally Neuro: Cranial nerves II-XII intact, no focal neurological deficits   The results of significant diagnostics from this hospitalization (including imaging, microbiology, ancillary and laboratory) are listed below for reference.      Procedures/Studies:  Dg Ribs Unilateral W/chest Right  Result Date: 07/14/2018 CLINICAL DATA:  Diffuse right-sided chest and rib pain after falling today. EXAM: RIGHT RIBS AND CHEST - 3+ VIEW COMPARISON:  05/16/2017 FINDINGS: Artifact overlies the chest. Previous median sternotomy. Cardiomegaly. Chronic aortic atherosclerosis. Pulmonary vascularity is normal. The lungs are clear. No pneumothorax or hemothorax. Rib films with marker in the region of pain show old healed or healing fractures of the anterior seventh and eighth ribs. I think there is an acute nondisplaced fracture of the anterior ninth rib. IMPRESSION: No active  cardiopulmonary disease. Acute nondisplaced fracture of the anterior right ninth rib. Healing fractures of the anterior right seventh and eighth ribs. Electronically Signed   By: Nelson Chimes M.D.   On: 07/14/2018 15:56   Ct Head Wo Contrast  Result Date: 07/14/2018 CLINICAL DATA:  85 year old female with head, face and neck injury following fall. Initial encounter. EXAM: CT HEAD WITHOUT CONTRAST CT MAXILLOFACIAL WITHOUT CONTRAST CT CERVICAL SPINE WITHOUT CONTRAST TECHNIQUE: Multidetector CT imaging of the head, cervical spine, and maxillofacial structures were performed using the standard protocol without intravenous contrast. Multiplanar CT image reconstructions of the cervical spine and maxillofacial structures were also generated. COMPARISON:  06/15/2018 CT, 07/21/2015 MR and 05/31/2006 cervical spine radiographs FINDINGS: CT HEAD FINDINGS Brain: No evidence of acute infarction, hemorrhage, hydrocephalus, extra-axial collection or mass lesion/mass effect. Atrophy, chronic small-vessel white matter ischemic changes and remote thalamic infarcts again noted. Vascular: Carotid atherosclerotic calcifications noted. Skull: Normal. Negative for fracture or focal lesion. Other: None. CT MAXILLOFACIAL FINDINGS Osseous: No fracture or mandibular dislocation. No destructive process. Orbits: Negative. No traumatic or inflammatory finding. Sinuses: Clear. Soft tissues: Negative. CT CERVICAL SPINE FINDINGS Alignment: Normal. Skull base and vertebrae:  No acute fracture. No primary bone lesion or focal pathologic process. Soft tissues and spinal canal: No prevertebral fluid or swelling. No visible canal hematoma. Disc levels: Mild-to-moderate multilevel degenerative disc disease, spondylosis and facet arthropathy again noted. Upper chest: No acute abnormality Other: None IMPRESSION: 1. No evidence of acute intracranial abnormality. Atrophy, chronic small-vessel white matter ischemic changes and remote thalamic infarcts. 2.  No evidence of acute facial fracture. 3. No static evidence of acute injury to the cervical spine. Degenerative changes. Electronically Signed   By: Margarette Canada M.D.   On: 07/14/2018 15:46   Ct Cervical Spine Wo Contrast  Result Date: 07/14/2018 CLINICAL DATA:  85 year old female with head, face and neck injury following fall. Initial encounter. EXAM: CT HEAD WITHOUT CONTRAST CT MAXILLOFACIAL WITHOUT CONTRAST CT CERVICAL SPINE WITHOUT CONTRAST TECHNIQUE: Multidetector CT imaging of the head, cervical spine, and maxillofacial structures were performed using the standard protocol without intravenous contrast. Multiplanar CT image reconstructions of the cervical spine and maxillofacial structures were also generated. COMPARISON:  06/15/2018 CT, 07/21/2015 MR and 05/31/2006 cervical spine radiographs FINDINGS: CT HEAD FINDINGS Brain: No evidence of acute infarction, hemorrhage, hydrocephalus, extra-axial collection or mass lesion/mass effect. Atrophy, chronic small-vessel white matter ischemic changes and remote thalamic infarcts again noted. Vascular: Carotid atherosclerotic calcifications noted. Skull: Normal. Negative for fracture or focal lesion. Other: None. CT MAXILLOFACIAL FINDINGS Osseous: No fracture or mandibular dislocation. No destructive process. Orbits: Negative. No traumatic or inflammatory finding. Sinuses: Clear. Soft tissues: Negative. CT CERVICAL SPINE FINDINGS Alignment: Normal. Skull base and vertebrae: No acute fracture. No primary bone lesion or focal pathologic process. Soft tissues and spinal canal: No prevertebral fluid or swelling. No visible canal hematoma. Disc levels: Mild-to-moderate multilevel degenerative disc disease, spondylosis and facet arthropathy again noted. Upper chest: No acute abnormality Other: None IMPRESSION: 1. No evidence of acute intracranial abnormality. Atrophy, chronic small-vessel white matter ischemic changes and remote thalamic infarcts. 2. No evidence of acute  facial fracture. 3. No static evidence of acute injury to the cervical spine. Degenerative changes. Electronically Signed   By: Margarette Canada M.D.   On: 07/14/2018 15:46   Dg Shoulder Left  Result Date: 07/14/2018 CLINICAL DATA:  [place] today.  Shoulder pain. EXAM: LEFT SHOULDER - 2+ VIEW COMPARISON:  None. FINDINGS: Chronic osteoarthritis of the glenohumeral joint. No fracture or dislocation. Narrowed humeral acromial distance likely indicating chronic rotator cuff tear. IMPRESSION: Chronic degenerative arthritis of the glenohumeral joint. Narrowed humeral acromial distance likely indicating chronic rotator cuff tear. Electronically Signed   By: Nelson Chimes M.D.   On: 07/14/2018 15:57   Ct Maxillofacial Wo Contrast  Result Date: 07/14/2018 CLINICAL DATA:  85 year old female with head, face and neck injury following fall. Initial encounter. EXAM: CT HEAD WITHOUT CONTRAST CT MAXILLOFACIAL WITHOUT CONTRAST CT CERVICAL SPINE WITHOUT CONTRAST TECHNIQUE: Multidetector CT imaging of the head, cervical spine, and maxillofacial structures were performed using the standard protocol without intravenous contrast. Multiplanar CT image reconstructions of the cervical spine and maxillofacial structures were also generated. COMPARISON:  06/15/2018 CT, 07/21/2015 MR and 05/31/2006 cervical spine radiographs FINDINGS: CT HEAD FINDINGS Brain: No evidence of acute infarction, hemorrhage, hydrocephalus, extra-axial collection or mass lesion/mass effect. Atrophy, chronic small-vessel white matter ischemic changes and remote thalamic infarcts again noted. Vascular: Carotid atherosclerotic calcifications noted. Skull: Normal. Negative for fracture or focal lesion. Other: None. CT MAXILLOFACIAL FINDINGS Osseous: No fracture or mandibular dislocation. No destructive process. Orbits: Negative. No traumatic or inflammatory finding. Sinuses: Clear. Soft tissues: Negative. CT CERVICAL  SPINE FINDINGS Alignment: Normal. Skull base and  vertebrae: No acute fracture. No primary bone lesion or focal pathologic process. Soft tissues and spinal canal: No prevertebral fluid or swelling. No visible canal hematoma. Disc levels: Mild-to-moderate multilevel degenerative disc disease, spondylosis and facet arthropathy again noted. Upper chest: No acute abnormality Other: None IMPRESSION: 1. No evidence of acute intracranial abnormality. Atrophy, chronic small-vessel white matter ischemic changes and remote thalamic infarcts. 2. No evidence of acute facial fracture. 3. No static evidence of acute injury to the cervical spine. Degenerative changes. Electronically Signed   By: Margarette Canada M.D.   On: 07/14/2018 15:46       LAB RESULTS: Basic Metabolic Panel: Recent Labs  Lab 07/15/18 0229 07/15/18 1414  NA 139 137  K 2.8* 4.0  CL 103 106  CO2 26 25  GLUCOSE 156* 191*  BUN 9 10  CREATININE 1.11* 1.21*  CALCIUM 9.3 9.2   Liver Function Tests: Recent Labs  Lab 07/15/18 0229  AST 19  ALT 13  ALKPHOS 54  BILITOT 1.2  PROT 5.2*  ALBUMIN 2.7*   No results for input(s): LIPASE, AMYLASE in the last 168 hours. No results for input(s): AMMONIA in the last 168 hours. CBC: Recent Labs  Lab 07/14/18 2240 07/15/18 0229  WBC 10.2 10.7*  HGB 12.1 11.0*  HCT 36.7 33.3*  MCV 90.2 89.5  PLT 150 140*   Cardiac Enzymes: No results for input(s): CKTOTAL, CKMB, CKMBINDEX, TROPONINI in the last 168 hours. BNP: Invalid input(s): POCBNP CBG: Recent Labs  Lab 07/18/18 2153 07/19/18 0808  GLUCAP 147* 108*      Disposition and Follow-up: Discharge Instructions    Diet Carb Modified   Complete by:  As directed    Increase activity slowly   Complete by:  As directed        DISPOSITION: [place], Stacy J, MD. Schedule an appointment as soon as possible for a visit in 2 week(s).   Specialty:  Internal Medicine Contact information: [place] [address] [phone]            Time coordinating discharge:  35 minutes  Signed:   Estill Cotta M.D. [facility] 07/19/2018, 11:00 AM

## 2018-07-19 NOTE — Progress Notes (Signed)
Report called to Kallie Edward, LPN, [facility] receiving nurse @ [phone]. Questions answered and call back number provided if additional questions needed. Daughter at the bedside when patient transferred out via stretcher by PTAR. D/C VS:  97.58F, 84, 18, 141/55, 99% RA.

## 2018-07-19 NOTE — Progress Notes (Signed)
Physical Therapy Treatment Patient Details Name: Sheila Morgan MRN: [id number] DOB: Mar 23, 1933 Today's Date: 07/19/2018    History of Present Illness  85 y.o. female with medical history significant of CAD , OA , HTN, osteoporosis, DM , gout, HOH ,Right shoulder radiculopathy, and PMR who was brought in to the ER today after sustaining a mechanical fall at home with complaints of rib pain.    PT Comments    Pt making good progress towards her goals, however is still limited in mobility by rib pain and increased time and effort required for mobility. Pt currently min guard for transfers and short distance ambulation. D/c plans remain appropriate. PT will continue to follow acutely.   Follow Up Recommendations  SNF     Equipment Recommendations  None recommended by PT    Recommendations for Other Services       Precautions / Restrictions Precautions Precautions: Fall Precaution Comments: bed/chair alarms, use RW  Restrictions Weight Bearing Restrictions: No    Mobility  Bed Mobility               General bed mobility comments: OOB in recliner  Transfers Overall transfer level: Needs assistance Equipment used: Rolling walker (2 wheeled) Transfers: Sit to/from Stand Sit to Stand: Min guard         General transfer comment: min guard for safety, vc for RW placement in front of chair and hand placement for power up, increased time and effort required  Ambulation/Gait Ambulation/Gait assistance: Min guard Gait Distance (Feet): 50 Feet Assistive device: Rolling walker (2 wheeled) Gait Pattern/deviations: Step-through pattern;Decreased stride length;Trunk flexed Gait velocity: slowed Gait velocity interpretation: <1.8 ft/sec, indicate of risk for recurrent falls General Gait Details: min guard for safety, gait slow but steady, vc for upright posture and proximity to RW         Balance Overall balance assessment: Mild deficits observed, not formally  tested;History of Falls Sitting-balance support: Feet supported;No upper extremity supported Sitting balance-Leahy Scale: Good     Standing balance support: No upper extremity supported;During functional activity Standing balance-Leahy Scale: Fair                              Cognition Arousal/Alertness: Awake/alert Behavior During Therapy: WFL for tasks assessed/performed Overall Cognitive Status: Within Functional Limits for tasks assessed                                 General Comments: increased time for response to questions, response appropriate      Exercises General Exercises - Lower Extremity Ankle Circles/Pumps: AROM;Both;10 reps Long Arc Quad: AROM;Both;10 reps;Seated Hip Flexion/Marching: AROM;Both;10 reps;Seated    General Comments General comments (skin integrity, edema, etc.): VSS      Pertinent Vitals/Pain Pain Assessment: Faces Faces Pain Scale: Hurts a little bit Pain Location: Ribs (R) Pain Descriptors / Indicators: Aching;Guarding Pain Intervention(s): Limited activity within patient's tolerance;Monitored during session;Repositioned           PT Goals (current goals can now be found in the care plan section) Acute Rehab PT Goals Patient Stated Goal: to return home PT Goal Formulation: With patient/family Time For Goal Achievement: 07/31/18 Potential to Achieve Goals: Good Progress towards PT goals: Progressing toward goals    Frequency    Min 2X/week      PT Plan Current plan remains appropriate    Co-evaluation PT/OT/SLP Co-Evaluation/Treatment:  Yes            AM-PAC PT "6 Clicks" Mobility   Outcome Measure  Help needed turning from your back to your side while in a flat bed without using bedrails?: A Little Help needed moving from lying on your back to sitting on the side of a flat bed without using bedrails?: A Lot Help needed moving to and from a bed to a chair (including a wheelchair)?: A  Little Help needed standing up from a chair using your arms (e.g., wheelchair or bedside chair)?: A Little Help needed to walk in hospital room?: A Little Help needed climbing 3-5 steps with a railing? : A Lot 6 Click Score: 16    End of Session Equipment Utilized During Treatment: Gait belt Activity Tolerance: Patient tolerated treatment well Patient left: in chair;with call bell/phone within reach;with chair alarm set;with family/visitor present Nurse Communication: Mobility status PT Visit Diagnosis: History of falling (Z91.81);Pain;Difficulty in walking, not elsewhere classified (R26.2) Pain - Right/Left: Right Pain - part of body: (torso)     Time: 9574-7340 PT Time Calculation (min) (ACUTE ONLY): 13 min  Charges:  $Gait Training: 8-22 mins                     Beverlie Kurihara B. Migdalia Dk PT, DPT Acute Rehabilitation Services Pager [phone] Office [phone]    [place] 07/19/2018, 11:34 AM

## 2018-07-19 NOTE — TOC Transition Note (Signed)
Transition of Care [facility]) - CM/SW Discharge Note   Patient Details  Name: Sheila Morgan MRN: [id number] Date of Birth: 08-24-1932  [facility]) CM/SW Contact:  Alberteen Sam, LCSW Phone Number: 07/19/2018, 11:58 AM   Clinical Narrative:     Patient will DC MB:[id number] Anticipated DC date:07/19/2018 Family notified:Sharon Transport DJ:TTSV  Per MD patient ready for DC to [facility] . RN, patient, patient's family, and facility notified of DC. Discharge Summary sent to facility. RN given number for report [phone] . DC packet on chart. Ambulance transport requested for patient.  CSW signing off.  [place], [place]   Final next level of care: Skilled Nursing Facility Barriers to Discharge: No Barriers Identified   Patient Goals and CMS Choice Patient states their goals for this hospitalization and ongoing recovery are:: patient wants to go home after rehab CMS Medicare.gov Compare Post Acute Care list provided to:: Patient Represenative (must comment)(patient's daughter at bedside) Choice offered to / list presented to : Adult Children  Discharge Placement PASRR number recieved: 07/18/18            Patient chooses bed at: [facility] Patient to be transferred to facility by: [facility] Name of family member notified: [name] (patient's daughter) Patient and family notified of of transfer: 07/19/18  Discharge Plan and Services Discharge Planning Services: NA Post Acute Care Choice: [place]          DME Arranged: N/A DME Agency: NA HH Arranged: NA HH Agency: NA   Social Determinants of Health (SDOH) Interventions     Readmission Risk Interventions No flowsheet data found.

## 2018-07-19 NOTE — Care Management Important Message (Signed)
Important Message  Patient Details  Name: Sheila Morgan MRN: [id number] Date of Birth: 1933-02-25   Medicare Important Message Given:  Yes    Shashana Fullington 07/19/2018, 10:20 AM

## 2018-07-20 ENCOUNTER — Telehealth: Payer: Self-pay | Admitting: *Deleted

## 2018-07-20 NOTE — Telephone Encounter (Signed)
Pt was on TCM report admitted 07/14/18. Patient presented with rib pain following a fall. Work-up revealed rib fracture. Patient's rib pain has been optimized, and was D/C to skilled nursing facility 07/19/18. Per summary need to see PCP 1-2 weeks after leaving SNF.Sheila KitchenJohny Chess

## 2018-07-22 ENCOUNTER — Encounter: Payer: Self-pay | Admitting: Internal Medicine

## 2018-07-25 ENCOUNTER — Other Ambulatory Visit: Payer: Self-pay | Admitting: Internal Medicine

## 2018-08-01 DIAGNOSIS — S2231XA Fracture of one rib, right side, initial encounter for closed fracture: Secondary | ICD-10-CM | POA: Diagnosis not present

## 2018-08-01 DIAGNOSIS — M1049 Other secondary gout, multiple sites: Secondary | ICD-10-CM | POA: Diagnosis not present

## 2018-08-01 DIAGNOSIS — H9193 Unspecified hearing loss, bilateral: Secondary | ICD-10-CM | POA: Diagnosis not present

## 2018-08-01 DIAGNOSIS — I10 Essential (primary) hypertension: Secondary | ICD-10-CM | POA: Diagnosis not present

## 2018-08-01 DIAGNOSIS — M810 Age-related osteoporosis without current pathological fracture: Secondary | ICD-10-CM | POA: Diagnosis not present

## 2018-08-01 DIAGNOSIS — R2689 Other abnormalities of gait and mobility: Secondary | ICD-10-CM | POA: Diagnosis not present

## 2018-08-01 DIAGNOSIS — E119 Type 2 diabetes mellitus without complications: Secondary | ICD-10-CM | POA: Diagnosis not present

## 2018-08-01 DIAGNOSIS — I25810 Atherosclerosis of coronary artery bypass graft(s) without angina pectoris: Secondary | ICD-10-CM | POA: Diagnosis not present

## 2018-08-04 ENCOUNTER — Other Ambulatory Visit: Payer: Self-pay | Admitting: *Deleted

## 2018-08-04 DIAGNOSIS — I10 Essential (primary) hypertension: Secondary | ICD-10-CM

## 2018-08-04 NOTE — Patient Outreach (Signed)
[facility]) Care Management  08/04/2018  Sheila Morgan 1932-09-09 [id number]   Collaboration with [facility]. [name] is currently at [facility] with likely discharge on 08/05/2018 to home.   [name] could benefit from [place] Management services post SNF discharge. She has medical history of DM, HTN, CAD, HLD, gout. Prior to SNF, per chart notes from inpatient hospitalization,  patient lived at home alone.  Noted Dr. Billey Gosling is PCP.   [name] has a supportive daughter who is listed as contactBarnetta [phone] or [phone]. Writer attempted to contact daughter to discuss [facility] Care Management follow up. Left HIPPA compliant voicemail message.  Will make referral to [facility] for follow up post SNF discharge once dc is confirmed. Of note, [name] currently remains in SNF with dc date slated for 08/05/2018.  Will continue to follow and make [facility] Management referral accordingly.   Marthenia Rolling, MSN-Ed, RN,BSN [place] Acute Care Coordinator [phone]

## 2018-08-04 NOTE — Patient Outreach (Signed)
[facility]) Care Management  08/04/2018  Sheila Morgan January 21, 1933 [id number]   Telephone call received from Barnetta Hammersmith (daughter). Writer explained [place] Management services and Sheila Morgan is agreeable.   Daughter endorses that she will stay with [name] at her home post SNF discharge. Sheila Morgan confirms discharge date is for tomorrow 08/05/18. She confirms [name] will have home health PT/   Confirmed Primary Care Provider is Dr. Quay Burow.  Provided [facility] Care Management contact information. Sheila Morgan states the best number to reach her is on her cell at [phone] since she will be staying at [name] house post SNF discharge.   Daughter expresses appreciation of call and [facility] Care Management follow up.  Will make referral to [facility] for complex case management. [name] has a history of DM, HTN, HLD,    Marthenia Rolling, MSN-Ed, RN,BSN [place] Acute Care Coordinator [phone]

## 2018-08-06 ENCOUNTER — Other Ambulatory Visit: Payer: Self-pay | Admitting: Internal Medicine

## 2018-08-06 ENCOUNTER — Other Ambulatory Visit: Payer: Self-pay | Admitting: Cardiovascular Disease

## 2018-08-06 DIAGNOSIS — I25810 Atherosclerosis of coronary artery bypass graft(s) without angina pectoris: Secondary | ICD-10-CM | POA: Diagnosis not present

## 2018-08-06 DIAGNOSIS — M109 Gout, unspecified: Secondary | ICD-10-CM | POA: Diagnosis not present

## 2018-08-06 DIAGNOSIS — Z951 Presence of aortocoronary bypass graft: Secondary | ICD-10-CM | POA: Diagnosis not present

## 2018-08-06 DIAGNOSIS — M353 Polymyalgia rheumatica: Secondary | ICD-10-CM | POA: Diagnosis not present

## 2018-08-06 DIAGNOSIS — H8110 Benign paroxysmal vertigo, unspecified ear: Secondary | ICD-10-CM | POA: Diagnosis not present

## 2018-08-06 DIAGNOSIS — Z96641 Presence of right artificial hip joint: Secondary | ICD-10-CM | POA: Diagnosis not present

## 2018-08-06 DIAGNOSIS — Z7982 Long term (current) use of aspirin: Secondary | ICD-10-CM | POA: Diagnosis not present

## 2018-08-06 DIAGNOSIS — Z96652 Presence of left artificial knee joint: Secondary | ICD-10-CM | POA: Diagnosis not present

## 2018-08-06 DIAGNOSIS — I10 Essential (primary) hypertension: Secondary | ICD-10-CM | POA: Diagnosis not present

## 2018-08-06 DIAGNOSIS — Z7984 Long term (current) use of oral hypoglycemic drugs: Secondary | ICD-10-CM | POA: Diagnosis not present

## 2018-08-06 DIAGNOSIS — S2241XD Multiple fractures of ribs, right side, subsequent encounter for fracture with routine healing: Secondary | ICD-10-CM | POA: Diagnosis not present

## 2018-08-06 DIAGNOSIS — Z7952 Long term (current) use of systemic steroids: Secondary | ICD-10-CM | POA: Diagnosis not present

## 2018-08-06 DIAGNOSIS — I69398 Other sequelae of cerebral infarction: Secondary | ICD-10-CM | POA: Diagnosis not present

## 2018-08-06 DIAGNOSIS — E1151 Type 2 diabetes mellitus with diabetic peripheral angiopathy without gangrene: Secondary | ICD-10-CM | POA: Diagnosis not present

## 2018-08-06 DIAGNOSIS — W19XXXD Unspecified fall, subsequent encounter: Secondary | ICD-10-CM | POA: Diagnosis not present

## 2018-08-06 DIAGNOSIS — E039 Hypothyroidism, unspecified: Secondary | ICD-10-CM | POA: Diagnosis not present

## 2018-08-08 ENCOUNTER — Telehealth: Payer: Self-pay | Admitting: Internal Medicine

## 2018-08-08 ENCOUNTER — Ambulatory Visit: Payer: Medicare Other | Admitting: Internal Medicine

## 2018-08-08 ENCOUNTER — Ambulatory Visit (INDEPENDENT_AMBULATORY_CARE_PROVIDER_SITE_OTHER): Payer: Medicare Other | Admitting: Internal Medicine

## 2018-08-08 ENCOUNTER — Encounter: Payer: Self-pay | Admitting: Internal Medicine

## 2018-08-08 ENCOUNTER — Other Ambulatory Visit: Payer: Self-pay | Admitting: *Deleted

## 2018-08-08 DIAGNOSIS — W19XXXD Unspecified fall, subsequent encounter: Secondary | ICD-10-CM | POA: Diagnosis not present

## 2018-08-08 DIAGNOSIS — I10 Essential (primary) hypertension: Secondary | ICD-10-CM | POA: Diagnosis not present

## 2018-08-08 DIAGNOSIS — E038 Other specified hypothyroidism: Secondary | ICD-10-CM

## 2018-08-08 DIAGNOSIS — I69398 Other sequelae of cerebral infarction: Secondary | ICD-10-CM | POA: Diagnosis not present

## 2018-08-08 DIAGNOSIS — S2241XD Multiple fractures of ribs, right side, subsequent encounter for fracture with routine healing: Secondary | ICD-10-CM

## 2018-08-08 DIAGNOSIS — I2510 Atherosclerotic heart disease of native coronary artery without angina pectoris: Secondary | ICD-10-CM

## 2018-08-08 DIAGNOSIS — I2583 Coronary atherosclerosis due to lipid rich plaque: Secondary | ICD-10-CM | POA: Diagnosis not present

## 2018-08-08 DIAGNOSIS — E1151 Type 2 diabetes mellitus with diabetic peripheral angiopathy without gangrene: Secondary | ICD-10-CM

## 2018-08-08 DIAGNOSIS — I25810 Atherosclerosis of coronary artery bypass graft(s) without angina pectoris: Secondary | ICD-10-CM | POA: Diagnosis not present

## 2018-08-08 DIAGNOSIS — H8110 Benign paroxysmal vertigo, unspecified ear: Secondary | ICD-10-CM | POA: Diagnosis not present

## 2018-08-08 NOTE — Patient Outreach (Signed)
[facility]) Care Management  08/08/2018  Sheila Morgan February 26, 1933 [id number]   Referral received 08/04/2018 Facility discharge 08/05/2018 Initial outreach 08/08/2018   Transition of care (template completed)  RN spoke with pt and introduced the [facility] services and purpose for today's call. Pt granted permission to discuss with her daughter [name]). Explained once again the [facility] services and purpose for today's call. Discussed pt's recent discharge from the SNF and offered to review the discharge information. Daughter very receptive and all medications were reviewed. Daughter states she use a pill box to manage the pt's medications for daily administration. Also states [facility] with [name] is involved with RN/PT pending a visit today with PT services. RN inquired on follow up visit with primary provider as daughter indicated the RN was going to call to arrange. RN strongly encourage the daughter to call and make arrangement no to delay due to pt's recent discharged. Daughter will contact Dr. Quay Burow today and make arrangements for the post-op follow up appointment. Verified sufficient transportation and enough medications (verified). Also inquired on pt's management of care as reported CBG 130 with no problems, BP 145/73 as pt able to obtain both readings independently and document her readings. Daughter states pt has a stair-lift, 3-1 commode and uses her rolling walker when ambulating with no reported issues at this time. RN inquired on GI/GU as pt states she is constipated. Daughter indicates pt usually uses Miralax and she will obtain some today. RN encouraged to inquired with her provider as the cost maybe less as a prescription on the generic then to OTC expense (receptive).   RN discussed available program and services in detail that are available to assist pt further with her ongoing recovery if needed. Also offered case management via weekly transition of care call on  prevention measures or any focus of disease management concerning pt's ongoing management of care. Daughter feels pt is doing well and opt to decline at this time but was very appreciative and grateful for the follow up call and information provider. RN offered to send [facility] packet concerning the information discussed today if services are needed in the future (receptive). No further needs as RN receive the summary of information discussed today and will notify pt's provider that caregiver/pt have opt to decline [facility] services with no needs at this time. Case will be closed.  Patient was recently discharged from hospital and all medications have been reviewed.  [name], RN Care Management Coordinator [place] Office [phone]

## 2018-08-08 NOTE — Assessment & Plan Note (Signed)
She fell at home 3/12-it was unwitnessed and she is not sure why she fell Her daughter found her on the floor She did fracture 1 of her ribs on the right side-pain has almost completely resolved Stressed the importance of avoiding further falls She is using a walker, has a stair lift at home and has PT coming to her house She will also have occupational therapy and is Artie had a nurse come out Advised her to take all precautions to avoid falling

## 2018-08-08 NOTE — Assessment & Plan Note (Signed)
Stable during her hospitalization and at rehab Continue current medication BP will be checked by PT, nurse

## 2018-08-08 NOTE — Telephone Encounter (Signed)
Gave verbal orders ok per Burns.

## 2018-08-08 NOTE — Telephone Encounter (Signed)
Simvastatin refilled. 

## 2018-08-08 NOTE — Assessment & Plan Note (Signed)
Continue current dose of levothyroxine.

## 2018-08-08 NOTE — Progress Notes (Signed)
Virtual Visit via Video Note  I connected with Sheila Morgan on 08/08/18 at  3:15 PM EDT by a video enabled telemedicine application and verified that I am speaking with the correct person using two identifiers.   I discussed the limitations of evaluation and management by telemedicine and the availability of in person appointments. The patient expressed understanding and agreed to proceed.  The patient is currently at home and her daughter is with her and I am in the office.    No referring provider.    History of Present Illness: This visit is for follow up for her recent hospitalization and rehab.  She was admitted 3/12-3/17 after a mechanical fall at home.  She was found to have multiple closed right sided rib fractures.  Mechanical fall at home, multiple rib fractures: Imaging in the ED showed nondisplaced fractures of the anterior right ninth rib and healing fractures of the anterior right seventh and eighth ribs PT recommended skilled nursing facility Pain improved significantly while in the hospital She was discharged to rehab with pain medication Using incentive spirometry  CAD, essential hypertension, hyperlipidemia: Stable, continued on home medications  Diabetes: Sugars well controlled in hospital Continued on metformin - taking 250 mg daily  Hypothyroidism: Stable Continued on levothyroxine    She is taking all her medications as prescribed.  Overall she is doing well and she has no concerns.  She was discharged home from rehab 08/05/2018.  Mechanical fall, rib fractures: She states the pain is much better and she barely has any pain at this point.  She denies pain with movement or deep breaths.  She is not taking any pain medication except for Tylenol when needed.  She denies any shortness of breath or fevers.  She has no cough.  She is walking around her house by herself with a walker.  She has a stair lift in the house and uses it.  She is already had the home  visiting nurse come out to see her and had physical therapy come out today.  She will be having OT and speech therapy coming out as well, but her daughter was unsure when.  She states she is eating well and sleeping well.  She still has dizziness/lightheadedness, which is chronic.  This is thought to be related to her prior strokes.  Other than that she has no concerns.  Her chronic medical problems were all stable during hospitalization and rehab and no medications were changed.  She is taking all those medications.   Observations/Objective: Appears well and in no acute distress. Breathing normally and does not appear to be short of breath Normal mood and affect.  Assessment and Plan:  See Problem List for Assessment and Plan of chronic medical problems.    Follow Up Instructions:  Agree with the necessity of physical therapy, home nurse, OT and speech therapy.   I discussed the assessment and treatment plan with the patient. The patient was provided an opportunity to ask questions and all were answered. The patient agreed with the plan and demonstrated an understanding of the instructions.   The patient was advised to call back or seek an in-person evaluation if the symptoms worsen or if the condition fails to improve as anticipated.    Binnie Rail, MD

## 2018-08-08 NOTE — Telephone Encounter (Signed)
Copied from [place] [id number]. Topic: Quick Communication - Home Health Verbal Orders >> Aug 08, 2018  8:28 AM Carolyn Stare wrote: Caller/Agency Scott with [facility] Number [phone]   SPZZCKICHT [id number] CYO 2 TIMES A WEEK FOR 3 WEEKS   PT will see her today

## 2018-08-08 NOTE — Assessment & Plan Note (Signed)
Stable.  Continue current medication

## 2018-08-08 NOTE — Assessment & Plan Note (Signed)
She did have a previous fall 01/2017 where she did break some of her right-sided ribs.  This most recent fall she likely only had one rib fracture, but other ribs were in the process of healing Pain has almost completely resolved Minimal pain at this point she is taking Tylenol for Continue Tylenol as needed We will be doing PT/OT, speech therapy and has nursing coming in

## 2018-08-09 DIAGNOSIS — I25810 Atherosclerosis of coronary artery bypass graft(s) without angina pectoris: Secondary | ICD-10-CM | POA: Diagnosis not present

## 2018-08-09 DIAGNOSIS — W19XXXD Unspecified fall, subsequent encounter: Secondary | ICD-10-CM | POA: Diagnosis not present

## 2018-08-09 DIAGNOSIS — I69398 Other sequelae of cerebral infarction: Secondary | ICD-10-CM | POA: Diagnosis not present

## 2018-08-09 DIAGNOSIS — S2241XD Multiple fractures of ribs, right side, subsequent encounter for fracture with routine healing: Secondary | ICD-10-CM | POA: Diagnosis not present

## 2018-08-09 DIAGNOSIS — H8110 Benign paroxysmal vertigo, unspecified ear: Secondary | ICD-10-CM | POA: Diagnosis not present

## 2018-08-09 DIAGNOSIS — I10 Essential (primary) hypertension: Secondary | ICD-10-CM | POA: Diagnosis not present

## 2018-08-09 NOTE — Telephone Encounter (Signed)
David with [facility] called and would like verbal orders for patient to continue PT for gate, strength, balance, and mobility. He would like the order as follow: 2X a week for 5 weeks and his call back number is [phone].

## 2018-08-10 DIAGNOSIS — W19XXXD Unspecified fall, subsequent encounter: Secondary | ICD-10-CM | POA: Diagnosis not present

## 2018-08-10 DIAGNOSIS — I25810 Atherosclerosis of coronary artery bypass graft(s) without angina pectoris: Secondary | ICD-10-CM | POA: Diagnosis not present

## 2018-08-10 DIAGNOSIS — S2241XD Multiple fractures of ribs, right side, subsequent encounter for fracture with routine healing: Secondary | ICD-10-CM | POA: Diagnosis not present

## 2018-08-10 DIAGNOSIS — I10 Essential (primary) hypertension: Secondary | ICD-10-CM | POA: Diagnosis not present

## 2018-08-10 DIAGNOSIS — H8110 Benign paroxysmal vertigo, unspecified ear: Secondary | ICD-10-CM | POA: Diagnosis not present

## 2018-08-10 DIAGNOSIS — I69398 Other sequelae of cerebral infarction: Secondary | ICD-10-CM | POA: Diagnosis not present

## 2018-08-10 NOTE — Telephone Encounter (Signed)
Gave ok for orders per Burns.

## 2018-08-12 DIAGNOSIS — I69398 Other sequelae of cerebral infarction: Secondary | ICD-10-CM | POA: Diagnosis not present

## 2018-08-12 DIAGNOSIS — I25810 Atherosclerosis of coronary artery bypass graft(s) without angina pectoris: Secondary | ICD-10-CM | POA: Diagnosis not present

## 2018-08-12 DIAGNOSIS — I10 Essential (primary) hypertension: Secondary | ICD-10-CM | POA: Diagnosis not present

## 2018-08-12 DIAGNOSIS — S2241XD Multiple fractures of ribs, right side, subsequent encounter for fracture with routine healing: Secondary | ICD-10-CM | POA: Diagnosis not present

## 2018-08-12 DIAGNOSIS — H8110 Benign paroxysmal vertigo, unspecified ear: Secondary | ICD-10-CM | POA: Diagnosis not present

## 2018-08-12 DIAGNOSIS — W19XXXD Unspecified fall, subsequent encounter: Secondary | ICD-10-CM | POA: Diagnosis not present

## 2018-08-15 ENCOUNTER — Telehealth: Payer: Self-pay | Admitting: Internal Medicine

## 2018-08-15 DIAGNOSIS — S2241XD Multiple fractures of ribs, right side, subsequent encounter for fracture with routine healing: Secondary | ICD-10-CM | POA: Diagnosis not present

## 2018-08-15 DIAGNOSIS — I10 Essential (primary) hypertension: Secondary | ICD-10-CM | POA: Diagnosis not present

## 2018-08-15 DIAGNOSIS — I69398 Other sequelae of cerebral infarction: Secondary | ICD-10-CM | POA: Diagnosis not present

## 2018-08-15 DIAGNOSIS — I25810 Atherosclerosis of coronary artery bypass graft(s) without angina pectoris: Secondary | ICD-10-CM | POA: Diagnosis not present

## 2018-08-15 DIAGNOSIS — H8110 Benign paroxysmal vertigo, unspecified ear: Secondary | ICD-10-CM | POA: Diagnosis not present

## 2018-08-15 DIAGNOSIS — W19XXXD Unspecified fall, subsequent encounter: Secondary | ICD-10-CM | POA: Diagnosis not present

## 2018-08-15 NOTE — Telephone Encounter (Signed)
noted 

## 2018-08-15 NOTE — Telephone Encounter (Signed)
Copied from [place] [phone]. Topic: Quick Communication - See Telephone Encounter >> Aug 15, 2018  4:17 PM Vernona Rieger wrote: CRM for notification. See Telephone encounter for: 08/15/18.  Meredith with [facility] called to let Dr Quay Burow know that she did an Occupational Therapy Evaluation and she does not need any additional visits.   Call back @ [phone] if needed

## 2018-08-16 DIAGNOSIS — S2241XD Multiple fractures of ribs, right side, subsequent encounter for fracture with routine healing: Secondary | ICD-10-CM | POA: Diagnosis not present

## 2018-08-16 DIAGNOSIS — I10 Essential (primary) hypertension: Secondary | ICD-10-CM | POA: Diagnosis not present

## 2018-08-16 DIAGNOSIS — I69398 Other sequelae of cerebral infarction: Secondary | ICD-10-CM | POA: Diagnosis not present

## 2018-08-16 DIAGNOSIS — W19XXXD Unspecified fall, subsequent encounter: Secondary | ICD-10-CM | POA: Diagnosis not present

## 2018-08-16 DIAGNOSIS — H8110 Benign paroxysmal vertigo, unspecified ear: Secondary | ICD-10-CM | POA: Diagnosis not present

## 2018-08-16 DIAGNOSIS — I25810 Atherosclerosis of coronary artery bypass graft(s) without angina pectoris: Secondary | ICD-10-CM | POA: Diagnosis not present

## 2018-08-17 ENCOUNTER — Other Ambulatory Visit: Payer: Self-pay | Admitting: Internal Medicine

## 2018-08-18 DIAGNOSIS — W19XXXD Unspecified fall, subsequent encounter: Secondary | ICD-10-CM | POA: Diagnosis not present

## 2018-08-18 DIAGNOSIS — S2241XD Multiple fractures of ribs, right side, subsequent encounter for fracture with routine healing: Secondary | ICD-10-CM | POA: Diagnosis not present

## 2018-08-18 DIAGNOSIS — I25810 Atherosclerosis of coronary artery bypass graft(s) without angina pectoris: Secondary | ICD-10-CM | POA: Diagnosis not present

## 2018-08-18 DIAGNOSIS — I10 Essential (primary) hypertension: Secondary | ICD-10-CM | POA: Diagnosis not present

## 2018-08-18 DIAGNOSIS — H8110 Benign paroxysmal vertigo, unspecified ear: Secondary | ICD-10-CM | POA: Diagnosis not present

## 2018-08-18 DIAGNOSIS — I69398 Other sequelae of cerebral infarction: Secondary | ICD-10-CM | POA: Diagnosis not present

## 2018-08-22 DIAGNOSIS — W19XXXD Unspecified fall, subsequent encounter: Secondary | ICD-10-CM | POA: Diagnosis not present

## 2018-08-22 DIAGNOSIS — I25810 Atherosclerosis of coronary artery bypass graft(s) without angina pectoris: Secondary | ICD-10-CM | POA: Diagnosis not present

## 2018-08-22 DIAGNOSIS — I10 Essential (primary) hypertension: Secondary | ICD-10-CM | POA: Diagnosis not present

## 2018-08-22 DIAGNOSIS — I69398 Other sequelae of cerebral infarction: Secondary | ICD-10-CM | POA: Diagnosis not present

## 2018-08-22 DIAGNOSIS — H8110 Benign paroxysmal vertigo, unspecified ear: Secondary | ICD-10-CM | POA: Diagnosis not present

## 2018-08-22 DIAGNOSIS — S2241XD Multiple fractures of ribs, right side, subsequent encounter for fracture with routine healing: Secondary | ICD-10-CM | POA: Diagnosis not present

## 2018-08-23 ENCOUNTER — Other Ambulatory Visit: Payer: Self-pay | Admitting: Internal Medicine

## 2018-08-24 DIAGNOSIS — W19XXXD Unspecified fall, subsequent encounter: Secondary | ICD-10-CM | POA: Diagnosis not present

## 2018-08-24 DIAGNOSIS — I69398 Other sequelae of cerebral infarction: Secondary | ICD-10-CM | POA: Diagnosis not present

## 2018-08-24 DIAGNOSIS — H8110 Benign paroxysmal vertigo, unspecified ear: Secondary | ICD-10-CM | POA: Diagnosis not present

## 2018-08-24 DIAGNOSIS — I25810 Atherosclerosis of coronary artery bypass graft(s) without angina pectoris: Secondary | ICD-10-CM | POA: Diagnosis not present

## 2018-08-24 DIAGNOSIS — S2241XD Multiple fractures of ribs, right side, subsequent encounter for fracture with routine healing: Secondary | ICD-10-CM | POA: Diagnosis not present

## 2018-08-24 DIAGNOSIS — I10 Essential (primary) hypertension: Secondary | ICD-10-CM | POA: Diagnosis not present

## 2018-08-25 DIAGNOSIS — H8110 Benign paroxysmal vertigo, unspecified ear: Secondary | ICD-10-CM | POA: Diagnosis not present

## 2018-08-25 DIAGNOSIS — I25810 Atherosclerosis of coronary artery bypass graft(s) without angina pectoris: Secondary | ICD-10-CM | POA: Diagnosis not present

## 2018-08-25 DIAGNOSIS — S2241XD Multiple fractures of ribs, right side, subsequent encounter for fracture with routine healing: Secondary | ICD-10-CM | POA: Diagnosis not present

## 2018-08-25 DIAGNOSIS — W19XXXD Unspecified fall, subsequent encounter: Secondary | ICD-10-CM | POA: Diagnosis not present

## 2018-08-25 DIAGNOSIS — I69398 Other sequelae of cerebral infarction: Secondary | ICD-10-CM | POA: Diagnosis not present

## 2018-08-25 DIAGNOSIS — I10 Essential (primary) hypertension: Secondary | ICD-10-CM | POA: Diagnosis not present

## 2018-08-29 DIAGNOSIS — I25810 Atherosclerosis of coronary artery bypass graft(s) without angina pectoris: Secondary | ICD-10-CM | POA: Diagnosis not present

## 2018-08-29 DIAGNOSIS — I69398 Other sequelae of cerebral infarction: Secondary | ICD-10-CM | POA: Diagnosis not present

## 2018-08-29 DIAGNOSIS — S2241XD Multiple fractures of ribs, right side, subsequent encounter for fracture with routine healing: Secondary | ICD-10-CM | POA: Diagnosis not present

## 2018-08-29 DIAGNOSIS — I10 Essential (primary) hypertension: Secondary | ICD-10-CM | POA: Diagnosis not present

## 2018-08-29 DIAGNOSIS — W19XXXD Unspecified fall, subsequent encounter: Secondary | ICD-10-CM | POA: Diagnosis not present

## 2018-08-29 DIAGNOSIS — H8110 Benign paroxysmal vertigo, unspecified ear: Secondary | ICD-10-CM | POA: Diagnosis not present

## 2018-08-31 ENCOUNTER — Telehealth: Payer: Self-pay | Admitting: Internal Medicine

## 2018-08-31 DIAGNOSIS — H8110 Benign paroxysmal vertigo, unspecified ear: Secondary | ICD-10-CM | POA: Diagnosis not present

## 2018-08-31 DIAGNOSIS — I10 Essential (primary) hypertension: Secondary | ICD-10-CM | POA: Diagnosis not present

## 2018-08-31 DIAGNOSIS — W19XXXD Unspecified fall, subsequent encounter: Secondary | ICD-10-CM | POA: Diagnosis not present

## 2018-08-31 DIAGNOSIS — I25810 Atherosclerosis of coronary artery bypass graft(s) without angina pectoris: Secondary | ICD-10-CM | POA: Diagnosis not present

## 2018-08-31 DIAGNOSIS — I69398 Other sequelae of cerebral infarction: Secondary | ICD-10-CM | POA: Diagnosis not present

## 2018-08-31 DIAGNOSIS — S2241XD Multiple fractures of ribs, right side, subsequent encounter for fracture with routine healing: Secondary | ICD-10-CM | POA: Diagnosis not present

## 2018-08-31 NOTE — Telephone Encounter (Signed)
Copied from [place] [id number]. Topic: Quick Communication - Home Health Verbal Orders >> Aug 31, 2018  4:54 PM Yvette Rack wrote: Caller/Agency: Deirdre with [facility] Number: [phone] -  confidential voicemail Requesting OT/PT/Skilled Nursing/Social Work/Speech Therapy: Speech Therapy  Frequency: 1 time a week for 1 week then 2 times a week for 3 weeks for cognitive communication deficit

## 2018-09-01 DIAGNOSIS — I69398 Other sequelae of cerebral infarction: Secondary | ICD-10-CM | POA: Diagnosis not present

## 2018-09-01 DIAGNOSIS — W19XXXD Unspecified fall, subsequent encounter: Secondary | ICD-10-CM | POA: Diagnosis not present

## 2018-09-01 DIAGNOSIS — S2241XD Multiple fractures of ribs, right side, subsequent encounter for fracture with routine healing: Secondary | ICD-10-CM | POA: Diagnosis not present

## 2018-09-01 DIAGNOSIS — I10 Essential (primary) hypertension: Secondary | ICD-10-CM | POA: Diagnosis not present

## 2018-09-01 DIAGNOSIS — H8110 Benign paroxysmal vertigo, unspecified ear: Secondary | ICD-10-CM | POA: Diagnosis not present

## 2018-09-01 DIAGNOSIS — I25810 Atherosclerosis of coronary artery bypass graft(s) without angina pectoris: Secondary | ICD-10-CM | POA: Diagnosis not present

## 2018-09-01 NOTE — Telephone Encounter (Signed)
Gave ok for orders per Dr. Burns.  

## 2018-09-05 DIAGNOSIS — M109 Gout, unspecified: Secondary | ICD-10-CM | POA: Diagnosis not present

## 2018-09-05 DIAGNOSIS — W19XXXD Unspecified fall, subsequent encounter: Secondary | ICD-10-CM | POA: Diagnosis not present

## 2018-09-05 DIAGNOSIS — E1151 Type 2 diabetes mellitus with diabetic peripheral angiopathy without gangrene: Secondary | ICD-10-CM | POA: Diagnosis not present

## 2018-09-05 DIAGNOSIS — H8110 Benign paroxysmal vertigo, unspecified ear: Secondary | ICD-10-CM | POA: Diagnosis not present

## 2018-09-05 DIAGNOSIS — Z7982 Long term (current) use of aspirin: Secondary | ICD-10-CM | POA: Diagnosis not present

## 2018-09-05 DIAGNOSIS — E039 Hypothyroidism, unspecified: Secondary | ICD-10-CM | POA: Diagnosis not present

## 2018-09-05 DIAGNOSIS — S2241XD Multiple fractures of ribs, right side, subsequent encounter for fracture with routine healing: Secondary | ICD-10-CM | POA: Diagnosis not present

## 2018-09-05 DIAGNOSIS — I25810 Atherosclerosis of coronary artery bypass graft(s) without angina pectoris: Secondary | ICD-10-CM | POA: Diagnosis not present

## 2018-09-05 DIAGNOSIS — Z7952 Long term (current) use of systemic steroids: Secondary | ICD-10-CM | POA: Diagnosis not present

## 2018-09-05 DIAGNOSIS — I69398 Other sequelae of cerebral infarction: Secondary | ICD-10-CM | POA: Diagnosis not present

## 2018-09-05 DIAGNOSIS — M353 Polymyalgia rheumatica: Secondary | ICD-10-CM | POA: Diagnosis not present

## 2018-09-05 DIAGNOSIS — Z951 Presence of aortocoronary bypass graft: Secondary | ICD-10-CM | POA: Diagnosis not present

## 2018-09-05 DIAGNOSIS — Z96652 Presence of left artificial knee joint: Secondary | ICD-10-CM | POA: Diagnosis not present

## 2018-09-05 DIAGNOSIS — Z96641 Presence of right artificial hip joint: Secondary | ICD-10-CM | POA: Diagnosis not present

## 2018-09-05 DIAGNOSIS — I10 Essential (primary) hypertension: Secondary | ICD-10-CM | POA: Diagnosis not present

## 2018-09-05 DIAGNOSIS — Z7984 Long term (current) use of oral hypoglycemic drugs: Secondary | ICD-10-CM | POA: Diagnosis not present

## 2018-09-06 ENCOUNTER — Telehealth: Payer: Self-pay | Admitting: Internal Medicine

## 2018-09-06 DIAGNOSIS — S2241XD Multiple fractures of ribs, right side, subsequent encounter for fracture with routine healing: Secondary | ICD-10-CM | POA: Diagnosis not present

## 2018-09-06 DIAGNOSIS — E039 Hypothyroidism, unspecified: Secondary | ICD-10-CM | POA: Diagnosis not present

## 2018-09-06 DIAGNOSIS — Z7982 Long term (current) use of aspirin: Secondary | ICD-10-CM

## 2018-09-06 DIAGNOSIS — M353 Polymyalgia rheumatica: Secondary | ICD-10-CM | POA: Diagnosis not present

## 2018-09-06 DIAGNOSIS — H8110 Benign paroxysmal vertigo, unspecified ear: Secondary | ICD-10-CM | POA: Diagnosis not present

## 2018-09-06 DIAGNOSIS — I10 Essential (primary) hypertension: Secondary | ICD-10-CM | POA: Diagnosis not present

## 2018-09-06 DIAGNOSIS — W19XXXD Unspecified fall, subsequent encounter: Secondary | ICD-10-CM | POA: Diagnosis not present

## 2018-09-06 DIAGNOSIS — Z96652 Presence of left artificial knee joint: Secondary | ICD-10-CM | POA: Diagnosis not present

## 2018-09-06 DIAGNOSIS — I25810 Atherosclerosis of coronary artery bypass graft(s) without angina pectoris: Secondary | ICD-10-CM | POA: Diagnosis not present

## 2018-09-06 DIAGNOSIS — E1151 Type 2 diabetes mellitus with diabetic peripheral angiopathy without gangrene: Secondary | ICD-10-CM | POA: Diagnosis not present

## 2018-09-06 DIAGNOSIS — Z96641 Presence of right artificial hip joint: Secondary | ICD-10-CM

## 2018-09-06 DIAGNOSIS — Z7951 Long term (current) use of inhaled steroids: Secondary | ICD-10-CM

## 2018-09-06 DIAGNOSIS — M109 Gout, unspecified: Secondary | ICD-10-CM | POA: Diagnosis not present

## 2018-09-06 DIAGNOSIS — Z7984 Long term (current) use of oral hypoglycemic drugs: Secondary | ICD-10-CM

## 2018-09-06 DIAGNOSIS — I69398 Other sequelae of cerebral infarction: Secondary | ICD-10-CM | POA: Diagnosis not present

## 2018-09-06 DIAGNOSIS — Z951 Presence of aortocoronary bypass graft: Secondary | ICD-10-CM | POA: Diagnosis not present

## 2018-09-06 NOTE — Telephone Encounter (Signed)
Copied from [place] [phone]. Topic: Quick Communication - Home Health Verbal Orders >> Sep 06, 2018  9:31 AM Rainey Pines A wrote: Caller/Agency: Deirdra; [place] Number: [phone] Requesting OT/PT/Skilled Nursing/Social Work/Speech Therapy: ST Frequency: 2 scheduled visits  to be delayed until the end of plan of care

## 2018-09-06 NOTE — Telephone Encounter (Signed)
Gave ok for orders per Dr. Burns.  

## 2018-09-08 DIAGNOSIS — I69398 Other sequelae of cerebral infarction: Secondary | ICD-10-CM | POA: Diagnosis not present

## 2018-09-08 DIAGNOSIS — S2241XD Multiple fractures of ribs, right side, subsequent encounter for fracture with routine healing: Secondary | ICD-10-CM | POA: Diagnosis not present

## 2018-09-08 DIAGNOSIS — W19XXXD Unspecified fall, subsequent encounter: Secondary | ICD-10-CM | POA: Diagnosis not present

## 2018-09-08 DIAGNOSIS — I10 Essential (primary) hypertension: Secondary | ICD-10-CM | POA: Diagnosis not present

## 2018-09-08 DIAGNOSIS — I25810 Atherosclerosis of coronary artery bypass graft(s) without angina pectoris: Secondary | ICD-10-CM | POA: Diagnosis not present

## 2018-09-08 DIAGNOSIS — H8110 Benign paroxysmal vertigo, unspecified ear: Secondary | ICD-10-CM | POA: Diagnosis not present

## 2018-09-12 DIAGNOSIS — S2241XD Multiple fractures of ribs, right side, subsequent encounter for fracture with routine healing: Secondary | ICD-10-CM | POA: Diagnosis not present

## 2018-09-12 DIAGNOSIS — W19XXXD Unspecified fall, subsequent encounter: Secondary | ICD-10-CM | POA: Diagnosis not present

## 2018-09-12 DIAGNOSIS — I25810 Atherosclerosis of coronary artery bypass graft(s) without angina pectoris: Secondary | ICD-10-CM | POA: Diagnosis not present

## 2018-09-12 DIAGNOSIS — I69398 Other sequelae of cerebral infarction: Secondary | ICD-10-CM | POA: Diagnosis not present

## 2018-09-12 DIAGNOSIS — I10 Essential (primary) hypertension: Secondary | ICD-10-CM | POA: Diagnosis not present

## 2018-09-12 DIAGNOSIS — H8110 Benign paroxysmal vertigo, unspecified ear: Secondary | ICD-10-CM | POA: Diagnosis not present

## 2018-09-13 ENCOUNTER — Other Ambulatory Visit: Payer: Self-pay

## 2018-09-13 ENCOUNTER — Encounter: Payer: Self-pay | Admitting: Podiatry

## 2018-09-13 ENCOUNTER — Ambulatory Visit (INDEPENDENT_AMBULATORY_CARE_PROVIDER_SITE_OTHER): Payer: Medicare Other | Admitting: Podiatry

## 2018-09-13 VITALS — Temp 97.0°F

## 2018-09-13 DIAGNOSIS — L84 Corns and callosities: Secondary | ICD-10-CM | POA: Diagnosis not present

## 2018-09-13 DIAGNOSIS — M79674 Pain in right toe(s): Secondary | ICD-10-CM

## 2018-09-13 DIAGNOSIS — B351 Tinea unguium: Secondary | ICD-10-CM | POA: Diagnosis not present

## 2018-09-13 DIAGNOSIS — M79675 Pain in left toe(s): Secondary | ICD-10-CM | POA: Diagnosis not present

## 2018-09-13 DIAGNOSIS — E1151 Type 2 diabetes mellitus with diabetic peripheral angiopathy without gangrene: Secondary | ICD-10-CM | POA: Diagnosis not present

## 2018-09-13 NOTE — Patient Instructions (Signed)

## 2018-09-14 DIAGNOSIS — W19XXXD Unspecified fall, subsequent encounter: Secondary | ICD-10-CM | POA: Diagnosis not present

## 2018-09-14 DIAGNOSIS — I69398 Other sequelae of cerebral infarction: Secondary | ICD-10-CM | POA: Diagnosis not present

## 2018-09-14 DIAGNOSIS — S2241XD Multiple fractures of ribs, right side, subsequent encounter for fracture with routine healing: Secondary | ICD-10-CM | POA: Diagnosis not present

## 2018-09-14 DIAGNOSIS — I25810 Atherosclerosis of coronary artery bypass graft(s) without angina pectoris: Secondary | ICD-10-CM | POA: Diagnosis not present

## 2018-09-14 DIAGNOSIS — H8110 Benign paroxysmal vertigo, unspecified ear: Secondary | ICD-10-CM | POA: Diagnosis not present

## 2018-09-14 DIAGNOSIS — I10 Essential (primary) hypertension: Secondary | ICD-10-CM | POA: Diagnosis not present

## 2018-09-18 NOTE — Progress Notes (Signed)
Subjective: Sheila Morgan presents with diabetes, diabetic neuropathy and cc of painful, discolored, thick toenails and b/l calluses which interfere with activities of daily living. Pain is aggravated when wearing enclosed shoe gear. Pain is relieved with periodic professional debridement.  Her daughter is present during the visit .  Sheila Morgan complains of painful left great toenail which is tender.   Binnie Rail, MD is her PCP and last visit was 08/08/2018.   Current Outpatient Medications:  .  acetaminophen (TYLENOL) 325 MG tablet, Take 2 tablets (650 mg total) by mouth every 6 (six) hours as needed for mild pain, fever or headache., Disp: , Rfl:  .  alendronate (FOSAMAX) 70 MG tablet, Take 70 mg by mouth every Saturday. Take with a full glass of water on an empty stomach. , Disp: , Rfl:  .  amLODipine (NORVASC) 5 MG tablet, TAKE 1 TABLET(5 MG) BY MOUTH DAILY (Patient taking differently: Take 5 mg by mouth daily. ), Disp: 90 tablet, Rfl: 1 .  aspirin 81 MG tablet, Take 81 mg by mouth daily., Disp: , Rfl:  .  Blood Glucose Monitoring Suppl (ONE TOUCH ULTRA MINI) W/DEVICE KIT, Use to test blood sugar twice daily ICD 10 E11 21, Disp: 1 each, Rfl: 0 .  Calcium Carbonate-Vitamin D (CALCIUM 600-D PO), Take 1 tablet by mouth 2 (two) times daily. , Disp: , Rfl:  .  carvedilol (COREG) 25 MG tablet, TAKE 1 TABLET(25 MG) BY MOUTH TWICE DAILY WITH A MEAL (Patient taking differently: Take 25 mg by mouth 2 (two) times daily with a meal. ), Disp: 180 tablet, Rfl: 1 .  colchicine (COLCRYS) 0.6 MG tablet, Take 1 tablet (0.6 mg total) by mouth every other day., Disp: 45 tablet, Rfl: 3 .  furosemide (LASIX) 20 MG tablet, TAKE 1 TABLET(20 MG) BY MOUTH DAILY (Patient taking differently: Take 20 mg by mouth daily. ), Disp: 90 tablet, Rfl: 1 .  glucose blood (ONE TOUCH ULTRA TEST) test strip, USE TO TEST BLOOD SUGAR TWICE DAILY Dx: E11.51, Disp: 100 each, Rfl: 3 .  isosorbide mononitrate (IMDUR) 30 MG 24 hr  tablet, TAKE 1 TABLET(30 MG) BY MOUTH DAILY, Disp: 90 tablet, Rfl: 1 .  levothyroxine (SYNTHROID, LEVOTHROID) 50 MCG tablet, TAKE 1 TABLET(50 MCG) BY MOUTH DAILY (Patient taking differently: Take 50 mcg by mouth daily. ), Disp: 90 tablet, Rfl: 0 .  losartan (COZAAR) 100 MG tablet, Take 1 tablet (100 mg total) by mouth daily., Disp: 90 tablet, Rfl: 1 .  metFORMIN (GLUCOPHAGE) 500 MG tablet, Take 0.5 tablets (250 mg total) by mouth daily. Take 1/2 tablet by mouth daily with largest meal, Disp: 45 tablet, Rfl: 1 .  OneTouch Delica Lancets 73X MISC, TEST BLOOD SUGAR TWICE DAILY, Disp: 200 each, Rfl: 1 .  predniSONE (DELTASONE) 5 MG tablet, Take 5 mg by mouth daily., Disp: , Rfl:  .  simvastatin (ZOCOR) 40 MG tablet, TAKE 1 TABLET(40 MG) BY MOUTH AT BEDTIME, Disp: 90 tablet, Rfl: 1 .  vitamin B-12 (CYANOCOBALAMIN) 1000 MCG tablet, Take 1,000 mcg by mouth daily.  , Disp: , Rfl:   No Known Allergies  Objective: Vitals:   09/13/18 0936  Temp: (!) 97 F (36.1 C)   Vascular Examination: Capillary refill time immediate x 10 digits.  Dorsalis pedis pulses present b/l.  Posterior tibial pulses absent b/l.  Digital hair absent x 10 digits.  Skin temperature gradient WNL b/l.  Dermatological Examination: Skin with normal turgor, texture and tone b/l.  Toenails 1-5 b/l  discolored, thick, dystrophic with subungual debris and pain with palpation to nailbeds due to thickness of nails. Incurvated nailplate left great toe medial border with tenderness to palpation. No erythema, no edema, no drainage noted.  Hyperkeratotic lesion submetatarsal head 5 b/l. No erythema, no edema, no drainage, no flocculence noted.   Musculoskeletal: Muscle strength 5/5 to all LE muscle groups  Neurological: Sensation diminished with 10 gram monofilament.  Vibratory sensation diminished  Assessment: 1. Painful onychomycosis toenails 1-5 b/l 2. Ingrown toenail medial border left great toe, noninfected 3. Calluses  submet head 5 b/l  4. NIDDM with peripheral circulatory disorder  Plan: 1. Continue diabetic foot care principles. Literature dispensed on today. 2. Toenails 1-5 b/l were debrided in length and girth without iatrogenic bleeding. Offending nail border debrided and curretaged left great toe. Border cleansed with alcohol. Antibiotic ointment applied. No further treatment required by patient. 3. Calluses pared submetatarsal head(s) 5 b/l utilizing sterile scalpel blade without incident. 4. Patient to continue soft, supportive shoe gear daily. 5. Patient to report any pedal injuries to medical professional  6. Follow up 3 months.  7. Patient/POA to call should there be a concern in the interim.

## 2018-09-19 DIAGNOSIS — I10 Essential (primary) hypertension: Secondary | ICD-10-CM | POA: Diagnosis not present

## 2018-09-19 DIAGNOSIS — I69398 Other sequelae of cerebral infarction: Secondary | ICD-10-CM | POA: Diagnosis not present

## 2018-09-19 DIAGNOSIS — W19XXXD Unspecified fall, subsequent encounter: Secondary | ICD-10-CM | POA: Diagnosis not present

## 2018-09-19 DIAGNOSIS — H8110 Benign paroxysmal vertigo, unspecified ear: Secondary | ICD-10-CM | POA: Diagnosis not present

## 2018-09-19 DIAGNOSIS — I25810 Atherosclerosis of coronary artery bypass graft(s) without angina pectoris: Secondary | ICD-10-CM | POA: Diagnosis not present

## 2018-09-19 DIAGNOSIS — S2241XD Multiple fractures of ribs, right side, subsequent encounter for fracture with routine healing: Secondary | ICD-10-CM | POA: Diagnosis not present

## 2018-09-21 DIAGNOSIS — I25810 Atherosclerosis of coronary artery bypass graft(s) without angina pectoris: Secondary | ICD-10-CM | POA: Diagnosis not present

## 2018-09-21 DIAGNOSIS — H8110 Benign paroxysmal vertigo, unspecified ear: Secondary | ICD-10-CM | POA: Diagnosis not present

## 2018-09-21 DIAGNOSIS — W19XXXD Unspecified fall, subsequent encounter: Secondary | ICD-10-CM | POA: Diagnosis not present

## 2018-09-21 DIAGNOSIS — I69398 Other sequelae of cerebral infarction: Secondary | ICD-10-CM | POA: Diagnosis not present

## 2018-09-21 DIAGNOSIS — S2241XD Multiple fractures of ribs, right side, subsequent encounter for fracture with routine healing: Secondary | ICD-10-CM | POA: Diagnosis not present

## 2018-09-21 DIAGNOSIS — I10 Essential (primary) hypertension: Secondary | ICD-10-CM | POA: Diagnosis not present

## 2018-09-27 ENCOUNTER — Telehealth: Payer: Self-pay | Admitting: Internal Medicine

## 2018-09-27 NOTE — Telephone Encounter (Signed)
LVM giving verbal orders per Dr. Burns.  

## 2018-09-27 NOTE — Telephone Encounter (Signed)
Copied from [place] [id number]. Topic: Quick Communication - Home Health Verbal Orders >> Sep 27, 2018  8:49 AM Rainey Pines A wrote: Caller/Agency: Deirdra with [facility] Number: [phone] Patients family has called to cancel todays visit and she is requesting a verbal order for this as soon as possible.

## 2018-09-30 NOTE — Telephone Encounter (Signed)
Sheila Morgan called back. She needs okay for a new visit. She is having to delay this visit for 2 weeks because she has been possibly exposed to [place] and is having to self quarantine.  Gave okay to schedule visit in two weeks per Delice Bison.

## 2018-10-04 DIAGNOSIS — I69398 Other sequelae of cerebral infarction: Secondary | ICD-10-CM | POA: Diagnosis not present

## 2018-10-04 DIAGNOSIS — H8110 Benign paroxysmal vertigo, unspecified ear: Secondary | ICD-10-CM | POA: Diagnosis not present

## 2018-10-04 DIAGNOSIS — I10 Essential (primary) hypertension: Secondary | ICD-10-CM | POA: Diagnosis not present

## 2018-10-04 DIAGNOSIS — W19XXXD Unspecified fall, subsequent encounter: Secondary | ICD-10-CM | POA: Diagnosis not present

## 2018-10-04 DIAGNOSIS — I25810 Atherosclerosis of coronary artery bypass graft(s) without angina pectoris: Secondary | ICD-10-CM | POA: Diagnosis not present

## 2018-10-04 DIAGNOSIS — S2241XD Multiple fractures of ribs, right side, subsequent encounter for fracture with routine healing: Secondary | ICD-10-CM | POA: Diagnosis not present

## 2018-10-08 ENCOUNTER — Other Ambulatory Visit: Payer: Self-pay | Admitting: Internal Medicine

## 2018-10-08 LAB — HM DIABETES EYE EXAM

## 2018-10-10 DIAGNOSIS — H524 Presbyopia: Secondary | ICD-10-CM | POA: Diagnosis not present

## 2018-10-10 DIAGNOSIS — H349 Unspecified retinal vascular occlusion: Secondary | ICD-10-CM | POA: Diagnosis not present

## 2018-10-10 DIAGNOSIS — D3131 Benign neoplasm of right choroid: Secondary | ICD-10-CM | POA: Diagnosis not present

## 2018-10-25 ENCOUNTER — Other Ambulatory Visit: Payer: Self-pay | Admitting: Internal Medicine

## 2018-11-01 ENCOUNTER — Other Ambulatory Visit: Payer: Self-pay

## 2018-11-01 ENCOUNTER — Ambulatory Visit: Payer: Medicare Other | Admitting: Orthotics

## 2018-11-01 DIAGNOSIS — E1151 Type 2 diabetes mellitus with diabetic peripheral angiopathy without gangrene: Secondary | ICD-10-CM

## 2018-11-01 DIAGNOSIS — L84 Corns and callosities: Secondary | ICD-10-CM

## 2018-11-01 NOTE — Progress Notes (Signed)
Chlora wants black vs tan shoes.

## 2018-11-02 ENCOUNTER — Encounter: Payer: Self-pay | Admitting: Internal Medicine

## 2018-11-17 ENCOUNTER — Ambulatory Visit (INDEPENDENT_AMBULATORY_CARE_PROVIDER_SITE_OTHER): Payer: Medicare Other | Admitting: Orthotics

## 2018-11-17 ENCOUNTER — Other Ambulatory Visit: Payer: Self-pay

## 2018-11-17 DIAGNOSIS — L84 Corns and callosities: Secondary | ICD-10-CM

## 2018-11-17 DIAGNOSIS — M2041 Other hammer toe(s) (acquired), right foot: Secondary | ICD-10-CM

## 2018-11-17 DIAGNOSIS — E1142 Type 2 diabetes mellitus with diabetic polyneuropathy: Secondary | ICD-10-CM

## 2018-11-17 DIAGNOSIS — E1151 Type 2 diabetes mellitus with diabetic peripheral angiopathy without gangrene: Secondary | ICD-10-CM

## 2018-11-17 DIAGNOSIS — M21612 Bunion of left foot: Secondary | ICD-10-CM | POA: Diagnosis not present

## 2018-12-07 NOTE — Progress Notes (Signed)
PATIENT: Sheila Morgan DOB: [birth year]  REASON FOR VISIT: follow up HISTORY FROM: patient  HISTORY OF PRESENT ILLNESS: Today 12/08/18  Sheila Morgan is an 85 year old female with history of memory disturbance.  Her last memory score was 25/30.  In the past she has had MRI of the brain that showed a moderate level small vessel disease that could contribute to her reported dizziness and gait instability, as well as memory problems.  She had CT scan of the brain in February 2020, it continued to show moderate level small vessel disease likely impacting her dizziness, balance, and possibly memory.  There is no real change from the prior study in 2017.  In the past she is decided to not go on memory medications. She had a fall in March 2020, it was unwitnessed, she fractured a rib. She had PT/OT come to her home. She lives alone. She has a life alert. She has a stair lift in the home and a shower chair.  She is able to perform her own ADLs. She has family close by. Her daughter and son come daily. She does her laundry and some light cooking. Her daughter handles the finances and medications. She drives a car only short distances. There haven't been any driving accidents or getting lost. Her appetite is good. 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 She had 1 fall since March, lost her balance bending over. She wasn't injured. She uses a cane and walker.  She presents for follow-up accompanied by her daughter.  HISTORY 06/09/2018 Dr. Jannifer Franklin: Ms. Offner is an 85 year old right-handed female with a history of problems with dizziness, she was seen by Dr. Tomi Likens in 2017.  The patient underwent an MRI of the brain at that time that showed a moderate level of periventricular white matter changes, no significant ischemia to the brainstem was noted.  The patient has continued to have some dizziness that occurs only with standing, not with sitting or lying down.  The patient will note that she  feels less dizzy or unsteady when she reaches over and touches something, she uses a cane for ambulation.  She has not had a recent fall, the last fall was 4 years ago.  The patient does report some numbness in the feet, she does have diabetes.  The patient has had some issues with memory over the last year, she lives alone currently, but her family looks in on her frequently.  The patient is able to manage her own medications, the daughter helps keep up with appointments, the patient does her own finances.  The patient does operate a motor vehicle going short distances, there have been no problems so far with this issue.  The patient denies issues controlling the bowels or the bladder, she does have some left shoulder pain associated with arthritis, she denies any neck pain or low back pain.  She does report some chronic fatigue but she feels that she sleeps fairly well at night.  The patient has been on chronic B12 supplementation.  Her last hemoglobin A1c was 6.6.  The patient denies any significant family history of memory disorders.  She is sent to this office for an evaluation.  REVIEW OF SYSTEMS: Out of a complete 14 system review of symptoms, the patient complains only of the following symptoms, and all other reviewed systems are negative.  Memory loss, falls, gait instability  ALLERGIES: No Known Allergies  HOME MEDICATIONS: Outpatient Medications Prior to Visit  Medication Sig Dispense Refill  . acetaminophen (TYLENOL) 325 MG tablet Take 2 tablets (650 mg total) by mouth every 6 (six) hours as needed for mild pain, fever or headache.    . alendronate (FOSAMAX) 70 MG tablet Take 70 mg by mouth every Saturday. Take with a full glass of water on an empty stomach.     Marland Kitchen amLODipine (NORVASC) 5 MG tablet TAKE 1 TABLET(5 MG) BY MOUTH DAILY (Patient taking differently: Take 5 mg by mouth daily. ) 90 tablet 1  . aspirin 81 MG tablet Take 81 mg by mouth daily.    . Blood Glucose Monitoring Suppl (ONE  TOUCH ULTRA MINI) W/DEVICE KIT Use to test blood sugar twice daily ICD 10 E11 21 1 each 0  . Calcium Carbonate-Vitamin D (CALCIUM 600-D PO) Take 1 tablet by mouth 2 (two) times daily.     . carvedilol (COREG) 25 MG tablet TAKE 1 TABLET(25 MG) BY MOUTH TWICE DAILY WITH A MEAL (Patient taking differently: Take 25 mg by mouth 2 (two) times daily with a meal. ) 180 tablet 1  . colchicine (COLCRYS) 0.6 MG tablet Take 1 tablet (0.6 mg total) by mouth every other day. 45 tablet 3  . furosemide (LASIX) 20 MG tablet TAKE 1 TABLET(20 MG) BY MOUTH DAILY 90 tablet 1  . glucose blood (ONE TOUCH ULTRA TEST) test strip USE TO TEST BLOOD SUGAR TWICE DAILY Dx: E11.51 100 each 3  . isosorbide mononitrate (IMDUR) 30 MG 24 hr tablet TAKE 1 TABLET(30 MG) BY MOUTH DAILY 90 tablet 1  . levothyroxine (SYNTHROID) 50 MCG tablet Take 1 tablet (50 mcg total) by mouth daily. 90 tablet 0  . losartan (COZAAR) 100 MG tablet Take 1 tablet (100 mg total) by mouth daily. 90 tablet 1  . metFORMIN (GLUCOPHAGE) 500 MG tablet Take 0.5 tablets (250 mg total) by mouth daily. Take 1/2 tablet by mouth daily with largest meal 45 tablet 1  . OneTouch Delica Lancets 81K MISC TEST BLOOD SUGAR TWICE DAILY 200 each 1  . predniSONE (DELTASONE) 5 MG tablet Take 5 mg by mouth daily.    . simvastatin (ZOCOR) 40 MG tablet TAKE 1 TABLET(40 MG) BY MOUTH AT BEDTIME 90 tablet 1  . vitamin B-12 (CYANOCOBALAMIN) 1000 MCG tablet Take 1,000 mcg by mouth daily.       No facility-administered medications prior to visit.     PAST MEDICAL HISTORY: Past Medical History:  Diagnosis Date  . Arthritis   . Cancer ([place])    skin on nose .  mylenoma  . Coronary artery disease    status post post LAD angioplasty in 1993  and subsequent coronary artery bypass grafting x1 with a LIMA to the LAD July of 1994. She had stenting of her RCA with a bare-metal stent November 2002.  . Diabetes mellitus    2  . Gout   . Heart murmur   . Hematoma    Right groin  .  Hemorrhoids   . Hiatal hernia   . History of blood transfusion   . HOH (hard of hearing)   . Hyperlipidemia   . Hypertension   . Left knee DJD   . Lightheadedness    has seen Dr Gwenlyn Found and ENT.. cant find out why  . Myocardial infarction ([place])   . Osteoporosis   . PONV (postoperative nausea and vomiting)   . Shingles 10/27/2013    PAST SURGICAL HISTORY: Past Surgical History:  Procedure Laterality Date  . Reading  LAD  . CORONARY ANGIOPLASTY WITH STENT PLACEMENT  03/09/01   RCA  . CORONARY ARTERY BYPASS GRAFT  1994  . JOINT REPLACEMENT Right 1998   hip  . NM MYOVIEW LTD  03/2012   Normal  . SKIN GRAFT     to nose skin cancer  . TOTAL HIP ARTHROPLASTY    . TOTAL KNEE ARTHROPLASTY Left 10/09/2013   Procedure: TOTAL KNEE ARTHROPLASTY;  Surgeon: Lorn Junes, MD;  Location: [facility];  Service: Orthopedics;  Laterality: Left;    FAMILY HISTORY: Family History  Problem Relation Age of Onset  . Heart disease Father   . Cancer Sister        breast  . Diabetes Sister   . Heart disease Brother   . Heart disease Brother   . Heart disease Brother   . Diabetes Sister   . Stroke Mother     SOCIAL HISTORY: Social History   Socioeconomic History  . Marital status: Widowed    Spouse name: Not on file  . Number of children: 2  . Years of education: Not on file  . Highest education level: 12th grade  Occupational History  . Occupation: Retired   Scientific laboratory technician  . Financial resource strain: Not hard at all  . Food insecurity    Worry: Never true    Inability: Never true  . Transportation needs    Medical: No    Non-medical: No  Tobacco Use  . Smoking status: Never Smoker  . Smokeless tobacco: Never Used  Substance and Sexual Activity  . Alcohol use: No  . Drug use: No  . Sexual activity: Never  Lifestyle  . Physical activity    Days per week: 0 days    Minutes per session: 0 min  . Stress: Not at all  Relationships  . Social connections     Talks on phone: More than three times a week    Gets together: More than three times a week    Attends religious service: More than 4 times per year    Active member of club or organization: Yes    Attends meetings of clubs or organizations: More than 4 times per year    Relationship status: Widowed  . Intimate partner violence    Fear of current or ex partner: Not on file    Emotionally abused: Not on file    Physically abused: Not on file    Forced sexual activity: Not on file  Other Topics Concern  . Not on file  Social History Narrative   Left handed    Caffeine 4 cups daily    Lives at home alone       PHYSICAL EXAM  There were no vitals filed for this visit. There is no height or weight on file to calculate BMI.  Generalized: Well developed, in no acute distress  MMSE - Mini Mental State Exam 12/08/2018 07/06/2018 06/09/2018  Not completed: - (No Data) -  Orientation to time 4 - 5  Orientation to Place 4 - 4  Registration 3 - 3  Attention/ Calculation 2 - 5  Recall 2 - 2  Language- name 2 objects 2 - 2  Language- repeat 1 - 0  Language- follow 3 step command 3 - 2  Language- read & follow direction 1 - 1  Write a sentence 1 - 1  Copy design 1 - 0  Copy design-comments 7 animals - -  Total score 24 - 25  Neurological examination  Mentation: Alert oriented to time, place, history taking. Follows all commands speech and language fluent Cranial nerve II-XII: Pupils were equal round reactive to light. Extraocular movements were full, visual field were full on confrontational test. Facial sensation and strength were normal.  Head turning and shoulder shrug  were normal and symmetric. Motor: The motor testing reveals 5 over 5 strength of all 4 extremities. Good symmetric motor tone is noted throughout.  Sensory: Sensory testing is intact to soft touch on all 4 extremities. No evidence of extinction is noted.  Coordination: Cerebellar testing reveals good finger-nose-finger  and heel-to-shin bilaterally.  Gait and station: Gait is unsteady, using a cane.  Is slow to stand, has to push off from chair.  Tandem gait not attempted. Reflexes: Deep tendon reflexes are symmetric and depressed bilaterally  DIAGNOSTIC DATA (LABS, IMAGING, TESTING) - I reviewed patient records, labs, notes, testing and imaging myself where available.  Lab Results  Component Value Date   WBC 10.7 (H) 07/15/2018   HGB 11.0 (L) 07/15/2018   HCT 33.3 (L) 07/15/2018   MCV 89.5 07/15/2018   PLT 140 (L) 07/15/2018      Component Value Date/Time   NA 137 07/15/2018 1414   K 4.0 07/15/2018 1414   CL 106 07/15/2018 1414   CO2 25 07/15/2018 1414   GLUCOSE 191 (H) 07/15/2018 1414   BUN 10 07/15/2018 1414   CREATININE 1.21 (H) 07/15/2018 1414   CALCIUM 9.2 07/15/2018 1414   PROT 5.2 (L) 07/15/2018 0229   PROT 7.2 06/17/2017 0838   ALBUMIN 2.7 (L) 07/15/2018 0229   ALBUMIN 4.5 06/17/2017 0838   AST 19 07/15/2018 0229   ALT 13 07/15/2018 0229   ALKPHOS 54 07/15/2018 0229   BILITOT 1.2 07/15/2018 0229   BILITOT 0.6 06/17/2017 0838   GFRNONAA 41 (L) 07/15/2018 1414   GFRAA 48 (L) 07/15/2018 1414   Lab Results  Component Value Date   CHOL 152 07/06/2018   HDL 69.90 07/06/2018   LDLCALC 57 07/06/2018   TRIG 127.0 07/06/2018   CHOLHDL 2 07/06/2018   Lab Results  Component Value Date   HGBA1C 7.2 (H) 07/06/2018   Lab Results  Component Value Date   VITAMINB12 >2000 (H) 06/09/2018   Lab Results  Component Value Date   TSH 2.26 07/06/2018    ASSESSMENT AND PLAN 85 y.o. year old female  has a past medical history of Arthritis, Cancer ([place]), Coronary artery disease, Diabetes mellitus, Gout, Heart murmur, Hematoma, Hemorrhoids, Hiatal hernia, History of blood transfusion, HOH (hard of hearing), Hyperlipidemia, Hypertension, Left knee DJD, Lightheadedness, Myocardial infarction ([place]), Osteoporosis, PONV (postoperative nausea and vomiting), and Shingles (10/27/2013). here with:  1.  Memory disturbance  2. Small vessel disease by CT scan 06/17/2018 3. Gait instability  Overall she is stable since last visit. She is to be very careful not to fall, use her walker or cane at all times.  She does have a life alert and stair lift.  We discussed her CT scan of the brain 06/17/2018, shows moderate small vessel disease, likely contributing to her memory, gait disturbance, and balance issues.  She will continue to work with her primary care doctor for management of risk factors.  Her blood pressure was elevated today, however she did not take her BP medications. She does not wish to start memory medications at this time.  Her driving should be closely monitored by her family.  She will follow-up in 6 months or sooner if needed.  I advised that if her symptoms worsen or she develops any new symptoms she should let us know.  I spent 15 minutes with the patient. 50% of this time was spent discussing her plan of care.    Butler Denmark, AGNP-C, DNP 12/08/2018, 9:17 AM [facility] [address] [place], [place] [address] [phone]

## 2018-12-08 ENCOUNTER — Other Ambulatory Visit: Payer: Self-pay

## 2018-12-08 ENCOUNTER — Ambulatory Visit (INDEPENDENT_AMBULATORY_CARE_PROVIDER_SITE_OTHER): Payer: Medicare Other | Admitting: Neurology

## 2018-12-08 ENCOUNTER — Encounter: Payer: Self-pay | Admitting: Neurology

## 2018-12-08 VITALS — BP 183/88 | HR 74 | Temp 97.7°F | Ht 67.0 in | Wt 157.8 lb

## 2018-12-08 DIAGNOSIS — R413 Other amnesia: Secondary | ICD-10-CM

## 2018-12-08 NOTE — Patient Instructions (Signed)
Please consider memory medications, we would try Aricept or Namenda, likely Aricept initially. Please be very careful not fall. Consider brain stimulating exercises, reading, doing a puzzle, playing a game, etc.    Donepezil tablets What is this medicine? DONEPEZIL (doe NEP e zil) is used to treat mild to moderate dementia caused by Alzheimer's disease. This medicine may be used for other purposes; ask your health care provider or pharmacist if you have questions. COMMON BRAND NAME(S): Aricept What should I tell my health care provider before I take this medicine? They need to know if you have any of these conditions:  asthma or other lung disease  difficulty passing urine  head injury  heart disease  history of irregular heartbeat  liver disease  seizures (convulsions)  stomach or intestinal disease, ulcers or stomach bleeding  an unusual or allergic reaction to donepezil, other medicines, foods, dyes, or preservatives  pregnant or trying to get pregnant  breast-feeding How should I use this medicine? Take this medicine by mouth with a glass of water. Follow the directions on the prescription label. You may take this medicine with or without food. Take this medicine at regular intervals. This medicine is usually taken before bedtime. Do not take it more often than directed. Continue to take your medicine even if you feel better. Do not stop taking except on your doctor's advice. If you are taking the 23 mg donepezil tablet, swallow it whole; do not cut, crush, or chew it. Talk to your pediatrician regarding the use of this medicine in children. Special care may be needed. Overdosage: If you think you have taken too much of this medicine contact a poison control center or emergency room at once. NOTE: This medicine is only for you. Do not share this medicine with others. What if I miss a dose? If you miss a dose, take it as soon as you can. If it is almost time for your next  dose, take only that dose, do not take double or extra doses. What may interact with this medicine? Do not take this medicine with any of the following medications:  certain medicines for fungal infections like itraconazole, fluconazole, posaconazole, and voriconazole  cisapride  dextromethorphan; quinidine  dronedarone  pimozide  quinidine  thioridazine This medicine may also interact with the following medications:  antihistamines for allergy, cough and cold  atropine  bethanechol  carbamazepine  certain medicines for bladder problems like oxybutynin, tolterodine  certain medicines for Parkinson's disease like benztropine, trihexyphenidyl  certain medicines for stomach problems like dicyclomine, hyoscyamine  certain medicines for travel sickness like scopolamine  dexamethasone  dofetilide  ipratropium  NSAIDs, medicines for pain and inflammation, like ibuprofen or naproxen  other medicines for Alzheimer's disease  other medicines that prolong the QT interval (cause an abnormal heart rhythm)  phenobarbital  phenytoin  rifampin, rifabutin or rifapentine  ziprasidone This list may not describe all possible interactions. Give your health care provider a list of all the medicines, herbs, non-prescription drugs, or dietary supplements you use. Also tell them if you smoke, drink alcohol, or use illegal drugs. Some items may interact with your medicine. What should I watch for while using this medicine? Visit your doctor or health care professional for regular checks on your progress. Check with your doctor or health care professional if your symptoms do not get better or if they get worse. You may get drowsy or dizzy. Do not drive, use machinery, or do anything that needs mental alertness until you  know how this drug affects you. What side effects may I notice from receiving this medicine? Side effects that you should report to your doctor or health care  professional as soon as possible:  allergic reactions like skin rash, itching or hives, swelling of the face, lips, or tongue  feeling faint or lightheaded, falls  loss of bladder control  seizures  signs and symptoms of a dangerous change in heartbeat or heart rhythm like chest pain; dizziness; fast or irregular heartbeat; palpitations; feeling faint or lightheaded, falls; breathing problems  signs and symptoms of infection like fever or chills; cough; sore throat; pain or trouble passing urine  signs and symptoms of liver injury like dark yellow or brown urine; general ill feeling or flu-like symptoms; light-colored stools; loss of appetite; nausea; right upper belly pain; unusually weak or tired; yellowing of the eyes or skin  slow heartbeat or palpitations  unusual bleeding or bruising  vomiting Side effects that usually do not require medical attention (report to your doctor or health care professional if they continue or are bothersome):  diarrhea, especially when starting treatment  headache  loss of appetite  muscle cramps  nausea  stomach upset This list may not describe all possible side effects. Call your doctor for medical advice about side effects. You may report side effects to FDA at 1-800-FDA-1088. Where should I keep my medicine? Keep out of reach of children. Store at room temperature between 15 and 30 degrees C (59 and 86 degrees F). Throw away any unused medicine after the expiration date. NOTE: This sheet is a summary. It may not cover all possible information. If you have questions about this medicine, talk to your doctor, pharmacist, or health care provider.  2020 Elsevier/Gold Standard (2018-04-11 10:33:41)

## 2018-12-08 NOTE — Progress Notes (Signed)
I have read the note, and I agree with the clinical assessment and plan.  Sheila Morgan   

## 2018-12-14 DIAGNOSIS — M1711 Unilateral primary osteoarthritis, right knee: Secondary | ICD-10-CM | POA: Diagnosis not present

## 2018-12-14 DIAGNOSIS — M810 Age-related osteoporosis without current pathological fracture: Secondary | ICD-10-CM | POA: Diagnosis not present

## 2018-12-14 DIAGNOSIS — Z7952 Long term (current) use of systemic steroids: Secondary | ICD-10-CM | POA: Diagnosis not present

## 2018-12-14 DIAGNOSIS — M25561 Pain in right knee: Secondary | ICD-10-CM | POA: Diagnosis not present

## 2018-12-14 DIAGNOSIS — M150 Primary generalized (osteo)arthritis: Secondary | ICD-10-CM | POA: Diagnosis not present

## 2018-12-14 DIAGNOSIS — M170 Bilateral primary osteoarthritis of knee: Secondary | ICD-10-CM | POA: Diagnosis not present

## 2018-12-14 DIAGNOSIS — Z79899 Other long term (current) drug therapy: Secondary | ICD-10-CM | POA: Diagnosis not present

## 2018-12-14 DIAGNOSIS — M1189 Other specified crystal arthropathies, multiple sites: Secondary | ICD-10-CM | POA: Diagnosis not present

## 2018-12-19 ENCOUNTER — Ambulatory Visit (INDEPENDENT_AMBULATORY_CARE_PROVIDER_SITE_OTHER): Payer: Medicare Other | Admitting: Podiatry

## 2018-12-19 ENCOUNTER — Encounter: Payer: Self-pay | Admitting: Podiatry

## 2018-12-19 ENCOUNTER — Other Ambulatory Visit: Payer: Self-pay

## 2018-12-19 VITALS — Temp 97.2°F

## 2018-12-19 DIAGNOSIS — B351 Tinea unguium: Secondary | ICD-10-CM

## 2018-12-19 DIAGNOSIS — M79675 Pain in left toe(s): Secondary | ICD-10-CM | POA: Diagnosis not present

## 2018-12-19 DIAGNOSIS — M79674 Pain in right toe(s): Secondary | ICD-10-CM | POA: Diagnosis not present

## 2018-12-19 DIAGNOSIS — L84 Corns and callosities: Secondary | ICD-10-CM

## 2018-12-19 DIAGNOSIS — E1151 Type 2 diabetes mellitus with diabetic peripheral angiopathy without gangrene: Secondary | ICD-10-CM

## 2018-12-19 LAB — HM DIABETES FOOT EXAM

## 2018-12-19 NOTE — Patient Instructions (Addendum)
Diabetes Mellitus and Foot Care Foot care is an important part of your health, especially when you have diabetes. Diabetes may cause you to have problems because of poor blood flow (circulation) to your feet and legs, which can cause your skin to:  Become thinner and drier.  Break more easily.  Heal more slowly.  Peel and crack. You may also have nerve damage (neuropathy) in your legs and feet, causing decreased feeling in them. This means that you may not notice minor injuries to your feet that could lead to more serious problems. Noticing and addressing any potential problems early is the best way to prevent future foot problems. How to care for your feet Foot hygiene  Wash your feet daily with warm water and mild soap. Do not use hot water. Then, pat your feet and the areas between your toes until they are completely dry. Do not soak your feet as this can dry your skin.  Trim your toenails straight across. Do not dig under them or around the cuticle. File the edges of your nails with an emery board or nail file.  Apply a moisturizing lotion or petroleum jelly to the skin on your feet and to dry, brittle toenails. Use lotion that does not contain alcohol and is unscented. Do not apply lotion between your toes. Shoes and socks  Wear clean socks or stockings every day. Make sure they are not too tight. Do not wear knee-high stockings since they may decrease blood flow to your legs.  Wear shoes that fit properly and have enough cushioning. Always look in your shoes before you put them on to be sure there are no objects inside.  To break in new shoes, wear them for just a few hours a day. This prevents injuries on your feet. Wounds, scrapes, corns, and calluses  Check your feet daily for blisters, cuts, bruises, sores, and redness. If you cannot see the bottom of your feet, use a mirror or ask someone for help.  Do not cut corns or calluses or try to remove them with medicine.  If you  find a minor scrape, cut, or break in the skin on your feet, keep it and the skin around it clean and dry. You may clean these areas with mild soap and water. Do not clean the area with peroxide, alcohol, or iodine.  If you have a wound, scrape, corn, or callus on your foot, look at it several times a day to make sure it is healing and not infected. Check for: ? Redness, swelling, or pain. ? Fluid or blood. ? Warmth. ? Pus or a bad smell. General instructions  Do not cross your legs. This may decrease blood flow to your feet.  Do not use heating pads or hot water bottles on your feet. They may burn your skin. If you have lost feeling in your feet or legs, you may not know this is happening until it is too late.  Protect your feet from hot and cold by wearing shoes, such as at the beach or on hot pavement.  Schedule a complete foot exam at least once a year (annually) or more often if you have foot problems. If you have foot problems, report any cuts, sores, or bruises to your health care provider immediately. Contact a health care provider if:  You have a medical condition that increases your risk of infection and you have any cuts, sores, or bruises on your feet.  You have an injury that is not   healing.  You have redness on your legs or feet.  You feel burning or tingling in your legs or feet.  You have pain or cramps in your legs and feet.  Your legs or feet are numb.  Your feet always feel cold.  You have pain around a toenail. Get help right away if:  You have a wound, scrape, corn, or callus on your foot and: ? You have pain, swelling, or redness that gets worse. ? You have fluid or blood coming from the wound, scrape, corn, or callus. ? Your wound, scrape, corn, or callus feels warm to the touch. ? You have pus or a bad smell coming from the wound, scrape, corn, or callus. ? You have a fever. ? You have a red line going up your leg. Summary  Check your feet every day  for cuts, sores, red spots, swelling, and blisters.  Moisturize feet and legs daily.  Wear shoes that fit properly and have enough cushioning.  If you have foot problems, report any cuts, sores, or bruises to your health care provider immediately.  Schedule a complete foot exam at least once a year (annually) or more often if you have foot problems. This information is not intended to replace advice given to you by your health care provider. Make sure you discuss any questions you have with your health care provider. Document Released: 04/17/2000 Document Revised: 06/02/2017 Document Reviewed: 05/22/2016 Elsevier Patient Education  2020 [employer]   Onychomycosis/Fungal Toenails  WHAT IS IT? An infection that lies within the keratin of your nail plate that is caused by a fungus.  WHY ME? Fungal infections affect all ages, sexes, races, and creeds.  There may be many factors that predispose you to a fungal infection such as age, coexisting medical conditions such as diabetes, or an autoimmune disease; stress, medications, fatigue, genetics, etc.  Bottom line: fungus thrives in a warm, moist environment and your shoes offer such a location.  IS IT CONTAGIOUS? Theoretically, yes.  You do not want to share shoes, nail clippers or files with someone who has fungal toenails.  Walking around barefoot in the same room or sleeping in the same bed is unlikely to transfer the organism.  It is important to realize, however, that fungus can spread easily from one nail to the next on the same foot.  HOW DO WE TREAT THIS?  There are several ways to treat this condition.  Treatment may depend on many factors such as age, medications, pregnancy, liver and kidney conditions, etc.  It is best to ask your doctor which options are available to you.  1. No treatment.   Unlike many other medical concerns, you can live with this condition.  However for many people this can be a painful condition and may lead to  ingrown toenails or a bacterial infection.  It is recommended that you keep the nails cut short to help reduce the amount of fungal nail. 2. Topical treatment.  These range from herbal remedies to prescription strength nail lacquers.  About 40-50% effective, topicals require twice daily application for approximately 9 to 12 months or until an entirely new nail has grown out.  The most effective topicals are medical grade medications available through physicians offices. 3. Oral antifungal medications.  With an 80-90% cure rate, the most common oral medication requires 3 to 4 months of therapy and stays in your system for a year as the new nail grows out.  Oral antifungal medications do require   blood work to make sure it is a safe drug for you.  A liver function panel will be performed prior to starting the medication and after the first month of treatment.  It is important to have the blood work performed to avoid any harmful side effects.  In general, this medication safe but blood work is required. 4. Laser Therapy.  This treatment is performed by applying a specialized laser to the affected nail plate.  This therapy is noninvasive, fast, and non-painful.  It is not covered by insurance and is therefore, out of pocket.  The results have been very good with a 80-95% cure rate.  The [facility] is the only practice in the area to offer this therapy. 5. Permanent Nail Avulsion.  Removing the entire nail so that a new nail will not grow back. 

## 2018-12-21 ENCOUNTER — Other Ambulatory Visit: Payer: Self-pay | Admitting: Internal Medicine

## 2018-12-28 NOTE — Progress Notes (Signed)
Subjective: Sheila Morgan is a 85 y.o. y.o. female who presents for preventative diabetic foot care today with PAD and cc of painful, discolored, thick toenails and calluses which pose a risk due to her diabetes. Painful toenails interfere with daily activities. Pain is aggravated when wearing enclosed shoe gear. Pain is relieved with periodic professional debridement.  Her daughter is present during the visit. Sheila Morgan relates since her last visit she has lost her son-in-law.  Binnie Rail, MD is her PCP.    Current Outpatient Medications:  .  acetaminophen (TYLENOL) 325 MG tablet, Take 2 tablets (650 mg total) by mouth every 6 (six) hours as needed for mild pain, fever or headache., Disp: , Rfl:  .  alendronate (FOSAMAX) 70 MG tablet, Take 70 mg by mouth every Saturday. Take with a full glass of water on an empty stomach. , Disp: , Rfl:  .  amLODipine (NORVASC) 5 MG tablet, TAKE 1 TABLET(5 MG) BY MOUTH DAILY (Patient taking differently: Take 5 mg by mouth daily. ), Disp: 90 tablet, Rfl: 1 .  aspirin 81 MG tablet, Take 81 mg by mouth daily., Disp: , Rfl:  .  Blood Glucose Monitoring Suppl (ONE TOUCH ULTRA MINI) W/DEVICE KIT, Use to test blood sugar twice daily ICD 10 E11 21, Disp: 1 each, Rfl: 0 .  Calcium Carbonate-Vitamin D (CALCIUM 600-D PO), Take 1 tablet by mouth 2 (two) times daily. , Disp: , Rfl:  .  CALCIUM CARBONATE-VITAMIN D PO, D3 (CALCIUM 500 + D) 500 mg(1,'250mg'$ ) -400 unit Chew, Disp: , Rfl:  .  carvedilol (COREG) 25 MG tablet, TAKE 1 TABLET(25 MG) BY MOUTH TWICE DAILY WITH A MEAL, Disp: 180 tablet, Rfl: 1 .  colchicine (COLCRYS) 0.6 MG tablet, Take 1 tablet (0.6 mg total) by mouth every other day., Disp: 45 tablet, Rfl: 3 .  furosemide (LASIX) 20 MG tablet, TAKE 1 TABLET(20 MG) BY MOUTH DAILY, Disp: 90 tablet, Rfl: 1 .  glucose blood (ONE TOUCH ULTRA TEST) test strip, USE TO TEST BLOOD SUGAR TWICE DAILY Dx: E11.51, Disp: 100 each, Rfl: 3 .  isosorbide mononitrate (IMDUR) 30 MG  24 hr tablet, TAKE 1 TABLET(30 MG) BY MOUTH DAILY, Disp: 90 tablet, Rfl: 1 .  levothyroxine (SYNTHROID) 50 MCG tablet, Take 1 tablet (50 mcg total) by mouth daily., Disp: 90 tablet, Rfl: 0 .  losartan (COZAAR) 100 MG tablet, Take 1 tablet (100 mg total) by mouth daily., Disp: 90 tablet, Rfl: 1 .  metFORMIN (GLUCOPHAGE) 500 MG tablet, Take 0.5 tablets (250 mg total) by mouth daily. Take 1/2 tablet by mouth daily with largest meal, Disp: 45 tablet, Rfl: 1 .  OneTouch Delica Lancets 68T MISC, TEST BLOOD SUGAR TWICE DAILY, Disp: 200 each, Rfl: 1 .  predniSONE (DELTASONE) 5 MG tablet, Take 5 mg by mouth daily., Disp: , Rfl:  .  simvastatin (ZOCOR) 40 MG tablet, TAKE 1 TABLET(40 MG) BY MOUTH AT BEDTIME, Disp: 90 tablet, Rfl: 1 .  vitamin B-12 (CYANOCOBALAMIN) 1000 MCG tablet, Take 1,000 mcg by mouth daily.  , Disp: , Rfl:   No Known Allergies  Objective: Vitals:   12/19/18 0851  Temp: (!) 97.2 F (36.2 C)    Vascular Examination: Capillary refill time <3 seconds x 10 digits  Dorsalis pedis pulses present b/l.  Posterior tibial pulses absent b/l.  No digital hair x 10 digits  Skin temperature WNL b/l.  Dermatological Examination: Skin thin, shiny and atrophic b/l.  Toenails 1-5 b/l discolored, thick, dystrophic with subungual  debris and pain with palpation to nailbeds due to thickness of nails.  Hyperkeratotic lesion submet head 5 b/l with tenderness to palpation. No edema, no erythema, no drainage, no flocculence.  Musculoskeletal: Muscle strength 5/5 to all LE muscle groups  Neurological: Sensation diminished b/l with 10 gram monofilament.  Vibratory sensation diminished b/l.  Assessment: 1. Painful onychomycosis toenails 1-5 b/l 2. Calluses submet head 5 b/l 3. NIDDM with Peripheral arterial disease  Plan: 1. Discuss diabetic foot care principles. Literature dispensed. 2. Toenails 1-5 b/l were debrided in length and girth without iatrogenic bleeding. 3. Hyperkeratotic  lesion(s) submet head 5 b/l pared with sterile chisel blade and gently smoothed with burr. 4. Patient to continue soft, supportive shoe gear daily. 5. Patient to report any pedal injuries to medical professional immediately. 6. Follow up 3 months.  7. Patient/POA to call should there be a concern in the interim.

## 2019-01-10 ENCOUNTER — Encounter: Payer: Self-pay | Admitting: Internal Medicine

## 2019-01-10 NOTE — Progress Notes (Signed)
Subjective:    Patient ID: Sheila Morgan, female    DOB: [birth year], 85 y.o.   MRN: [id number]  HPI The patient is here for follow up.  Her daughter is with her today.  Neither 1 of them have any concerns.  She gets meals on wheels.   Diabetes: She is taking her medication daily as prescribed. She is compliant with a diabetic diet.   She is up-to-date with an ophthalmology examination.   CAD, Hypertension: She is taking her medication daily. She is compliant with a low sodium diet.  She denies chest pain, palpitations, edema, shortness of breath and regular headaches.    Hypothyroidism:  She is taking her medication daily.  She denies any recent changes in energy or weight that are unexplained.   Hyperlipidemia: She is taking her medication daily. She is compliant with a low fat/cholesterol diet. She denies myalgias.   Memory difficulties:  She follows with Dr Jannifer Franklin.  She is not on any medication.  Her daughter states her memory is about the same.  Chronic dizziness:  No cause was found for the dizziness.  CT was showed old infarcts, which is likely the cause.  She walks with a cane.    Gout:  She is taking colchicine every other day.  There have been no gout symptoms.  Anemia:  Her last blood work showed mild anemia.  This was around the time that she was in the hospital for her rib fractures after mechanical fall.     Medications and allergies reviewed with patient and updated if appropriate.  Patient Active Problem List   Diagnosis Date Noted  . Fall 07/14/2018  . Hearing loss 01/05/2018  . Memory difficulties 01/05/2018  . Multiple closed fractures of ribs of right side 01/19/2017  . Acute pain of right shoulder 01/05/2017  . Rib pain on right side 01/05/2017  . Acute pain of right knee 01/05/2017  . B12 deficiency 06/13/2015  . Hypothyroidism 06/13/2015  . DM (diabetes mellitus) type II, controlled, with peripheral vascular disorder ([place]) 03/06/2014  . Gout  10/15/2013  . Arthritis   . Hiatal hernia   . Coronary artery disease   . Left knee DJD   . Benign paroxysmal positional vertigo 05/19/2013  . Weakness generalized 05/09/2013  . Pseudogout of left knee 03/29/2013  . Acute right lumbar radiculopathy 03/18/2013  . Shoulder impingement syndrome 01/25/2013  . Vertigo 10/17/2012  . Renal insufficiency, mild 06/20/2011  . Essential hypertension 12/31/2009  . Osteoporosis 12/31/2009  . SKIN CANCER, HX OF 12/31/2009  . Proteinuria 01/05/2008  . Anemia 12/21/2007  . Hyperlipidemia 09/08/2006  . Polymyalgia rheumatica ([place]) 09/08/2006  . CORONARY ARTERY BYPASS GRAFT, HX OF 09/08/2006    Current Outpatient Medications on File Prior to Visit  Medication Sig Dispense Refill  . acetaminophen (TYLENOL) 325 MG tablet Take 2 tablets (650 mg total) by mouth every 6 (six) hours as needed for mild pain, fever or headache.    . alendronate (FOSAMAX) 70 MG tablet Take 70 mg by mouth every Saturday. Take with a full glass of water on an empty stomach.     Marland Kitchen amLODipine (NORVASC) 5 MG tablet TAKE 1 TABLET(5 MG) BY MOUTH DAILY (Patient taking differently: Take 5 mg by mouth daily. ) 90 tablet 1  . aspirin 81 MG tablet Take 81 mg by mouth daily.    . Blood Glucose Monitoring Suppl (ONE TOUCH ULTRA MINI) W/DEVICE KIT Use to test blood sugar twice daily ICD 10  E11 21 1 each 0  . Calcium Carbonate-Vitamin D (CALCIUM 600-D PO) Take 1 tablet by mouth 2 (two) times daily.     . carvedilol (COREG) 25 MG tablet TAKE 1 TABLET(25 MG) BY MOUTH TWICE DAILY WITH A MEAL 180 tablet 1  . colchicine (COLCRYS) 0.6 MG tablet Take 1 tablet (0.6 mg total) by mouth every other day. 45 tablet 3  . furosemide (LASIX) 20 MG tablet TAKE 1 TABLET(20 MG) BY MOUTH DAILY 90 tablet 1  . glucose blood (ONE TOUCH ULTRA TEST) test strip USE TO TEST BLOOD SUGAR TWICE DAILY Dx: E11.51 100 each 3  . isosorbide mononitrate (IMDUR) 30 MG 24 hr tablet TAKE 1 TABLET(30 MG) BY MOUTH DAILY 90 tablet 1   . levothyroxine (SYNTHROID) 50 MCG tablet Take 1 tablet (50 mcg total) by mouth daily. 90 tablet 0  . losartan (COZAAR) 100 MG tablet Take 1 tablet (100 mg total) by mouth daily. 90 tablet 1  . metFORMIN (GLUCOPHAGE) 500 MG tablet Take 0.5 tablets (250 mg total) by mouth daily. Take 1/2 tablet by mouth daily with largest meal 45 tablet 1  . OneTouch Delica Lancets 31P MISC TEST BLOOD SUGAR TWICE DAILY 200 each 1  . predniSONE (DELTASONE) 5 MG tablet Take 5 mg by mouth daily.    . simvastatin (ZOCOR) 40 MG tablet TAKE 1 TABLET(40 MG) BY MOUTH AT BEDTIME 90 tablet 1  . vitamin B-12 (CYANOCOBALAMIN) 1000 MCG tablet Take 1,000 mcg by mouth daily.       No current facility-administered medications on file prior to visit.     Past Medical History:  Diagnosis Date  . Arthritis   . Cancer ([place])    skin on nose .  mylenoma  . Coronary artery disease    status post post LAD angioplasty in 1993  and subsequent coronary artery bypass grafting x1 with a LIMA to the LAD July of 1994. She had stenting of her RCA with a bare-metal stent November 2002.  . Diabetes mellitus    2  . Gout   . Heart murmur   . Hematoma    Right groin  . Hemorrhoids   . Hiatal hernia   . History of blood transfusion   . HOH (hard of hearing)   . Hyperlipidemia   . Hypertension   . Left knee DJD   . Lightheadedness    has seen Dr Gwenlyn Found and ENT.. cant find out why  . Myocardial infarction ([place])   . Osteoporosis   . PONV (postoperative nausea and vomiting)   . Shingles 10/27/2013    Past Surgical History:  Procedure Laterality Date  . CORONARY ANGIOPLASTY  1993   LAD  . CORONARY ANGIOPLASTY WITH STENT PLACEMENT  03/09/01   RCA  . CORONARY ARTERY BYPASS GRAFT  1994  . JOINT REPLACEMENT Right 1998   hip  . NM MYOVIEW LTD  03/2012   Normal  . SKIN GRAFT     to nose skin cancer  . TOTAL HIP ARTHROPLASTY    . TOTAL KNEE ARTHROPLASTY Left 10/09/2013   Procedure: TOTAL KNEE ARTHROPLASTY;  Surgeon: Lorn Junes, MD;  Location: [facility];  Service: Orthopedics;  Laterality: Left;    Social History   Socioeconomic History  . Marital status: Widowed    Spouse name: Not on file  . Number of children: 2  . Years of education: Not on file  . Highest education level: 12th grade  Occupational History  . Occupation: Retired  Social Needs  . Financial resource strain: Not hard at all  . Food insecurity    Worry: Never true    Inability: Never true  . Transportation needs    Medical: No    Non-medical: No  Tobacco Use  . Smoking status: Never Smoker  . Smokeless tobacco: Never Used  Substance and Sexual Activity  . Alcohol use: No  . Drug use: No  . Sexual activity: Never  Lifestyle  . Physical activity    Days per week: 0 days    Minutes per session: 0 min  . Stress: Not at all  Relationships  . Social connections    Talks on phone: More than three times a week    Gets together: More than three times a week    Attends religious service: More than 4 times per year    Active member of club or organization: Yes    Attends meetings of clubs or organizations: More than 4 times per year    Relationship status: Widowed  Other Topics Concern  . Not on file  Social History Narrative   Left handed    Caffeine 4 cups daily    Lives at home alone     Family History  Problem Relation Age of Onset  . Heart disease Father   . Cancer Sister        breast  . Diabetes Sister   . Heart disease Brother   . Heart disease Brother   . Heart disease Brother   . Diabetes Sister   . Stroke Mother     Review of Systems  Constitutional: Negative for chills and fever.  Respiratory: Negative for cough, shortness of breath and wheezing.   Cardiovascular: Negative for chest pain, palpitations and leg swelling.  Gastrointestinal: Negative for abdominal pain.  Neurological: Positive for dizziness. Negative for headaches.  Hematological: Bruises/bleeds easily.       Objective:   Vitals:    01/11/19 0855  BP: (!) 152/80  Pulse: 77  Resp: 16  Temp: 98.3 F (36.8 C)  SpO2: 97%   BP Readings from Last 3 Encounters:  01/11/19 (!) 152/80  12/08/18 (!) 183/88  07/19/18 (!) 141/76   Wt Readings from Last 3 Encounters:  01/11/19 158 lb (71.7 kg)  12/08/18 157 lb 12.8 oz (71.6 kg)  07/14/18 158 lb (71.7 kg)   Body mass index is 24.75 kg/m.   Physical Exam    Constitutional: Appears well-developed and well-nourished. No distress.  HENT:  Head: Normocephalic and atraumatic.  Neck: Neck supple. No tracheal deviation present. No thyromegaly present.  No cervical lymphadenopathy Cardiovascular: Normal rate, regular rhythm and normal heart sounds.   No murmur heard. No carotid bruit .  No edema Pulmonary/Chest: Effort normal and breath sounds normal. No respiratory distress. No has no wheezes. No rales. Abdomen: Soft, nontender, nondistended Skin: Skin is warm and dry. Not diaphoretic.  Mild bruising bilateral arms from mild trauma Psychiatric: Normal mood and affect. Behavior is normal.      Assessment & Plan:    See Problem List for Assessment and Plan of chronic medical problems.

## 2019-01-10 NOTE — Patient Instructions (Addendum)
  Tests ordered today. Your results will be released to [place] (or called to you) after review.  If any changes need to be made, you will be notified at that same time.  Flu immunization administered today.    Medications reviewed and updated.  Changes include :  none    Please followup in 7 months

## 2019-01-11 ENCOUNTER — Encounter: Payer: Self-pay | Admitting: Internal Medicine

## 2019-01-11 ENCOUNTER — Ambulatory Visit (INDEPENDENT_AMBULATORY_CARE_PROVIDER_SITE_OTHER): Payer: Medicare Other | Admitting: Internal Medicine

## 2019-01-11 ENCOUNTER — Other Ambulatory Visit (INDEPENDENT_AMBULATORY_CARE_PROVIDER_SITE_OTHER): Payer: Medicare Other

## 2019-01-11 ENCOUNTER — Other Ambulatory Visit: Payer: Self-pay

## 2019-01-11 VITALS — BP 152/80 | HR 77 | Temp 98.3°F | Resp 16 | Ht 67.0 in | Wt 158.0 lb

## 2019-01-11 DIAGNOSIS — I2583 Coronary atherosclerosis due to lipid rich plaque: Secondary | ICD-10-CM

## 2019-01-11 DIAGNOSIS — E1151 Type 2 diabetes mellitus with diabetic peripheral angiopathy without gangrene: Secondary | ICD-10-CM | POA: Diagnosis not present

## 2019-01-11 DIAGNOSIS — E038 Other specified hypothyroidism: Secondary | ICD-10-CM

## 2019-01-11 DIAGNOSIS — R413 Other amnesia: Secondary | ICD-10-CM

## 2019-01-11 DIAGNOSIS — I2510 Atherosclerotic heart disease of native coronary artery without angina pectoris: Secondary | ICD-10-CM

## 2019-01-11 DIAGNOSIS — M1A9XX0 Chronic gout, unspecified, without tophus (tophi): Secondary | ICD-10-CM | POA: Diagnosis not present

## 2019-01-11 DIAGNOSIS — I10 Essential (primary) hypertension: Secondary | ICD-10-CM

## 2019-01-11 DIAGNOSIS — Z23 Encounter for immunization: Secondary | ICD-10-CM

## 2019-01-11 DIAGNOSIS — E782 Mixed hyperlipidemia: Secondary | ICD-10-CM

## 2019-01-11 DIAGNOSIS — D649 Anemia, unspecified: Secondary | ICD-10-CM | POA: Diagnosis not present

## 2019-01-11 LAB — CBC WITH DIFFERENTIAL/PLATELET
Basophils Absolute: 0.1 10*3/uL (ref 0.0–0.1)
Basophils Relative: 1 % (ref 0.0–3.0)
Eosinophils Absolute: 0.2 10*3/uL (ref 0.0–0.7)
Eosinophils Relative: 1.7 % (ref 0.0–5.0)
HCT: 40.1 % (ref 36.0–46.0)
Hemoglobin: 13.6 g/dL (ref 12.0–15.0)
Lymphocytes Relative: 21.7 % (ref 12.0–46.0)
Lymphs Abs: 2 10*3/uL (ref 0.7–4.0)
MCHC: 33.9 g/dL (ref 30.0–36.0)
MCV: 91.5 fl (ref 78.0–100.0)
Monocytes Absolute: 1.1 10*3/uL — ABNORMAL HIGH (ref 0.1–1.0)
Monocytes Relative: 11.5 % (ref 3.0–12.0)
Neutro Abs: 6 10*3/uL (ref 1.4–7.7)
Neutrophils Relative %: 64.1 % (ref 43.0–77.0)
Platelets: 181 10*3/uL (ref 150.0–400.0)
RBC: 4.39 Mil/uL (ref 3.87–5.11)
RDW: 14.9 % (ref 11.5–15.5)
WBC: 9.4 10*3/uL (ref 4.0–10.5)

## 2019-01-11 LAB — COMPREHENSIVE METABOLIC PANEL
ALT: 15 U/L (ref 0–35)
AST: 16 U/L (ref 0–37)
Albumin: 4.1 g/dL (ref 3.5–5.2)
Alkaline Phosphatase: 64 U/L (ref 39–117)
BUN: 17 mg/dL (ref 6–23)
CO2: 31 mEq/L (ref 19–32)
Calcium: 10.7 mg/dL — ABNORMAL HIGH (ref 8.4–10.5)
Chloride: 103 mEq/L (ref 96–112)
Creatinine, Ser: 1.17 mg/dL (ref 0.40–1.20)
GFR: 53.28 mL/min — ABNORMAL LOW (ref >60.00)
Glucose, Bld: 140 mg/dL — ABNORMAL HIGH (ref 70–99)
Potassium: 3.9 mEq/L (ref 3.5–5.1)
Sodium: 141 mEq/L (ref 135–145)
Total Bilirubin: 0.5 mg/dL (ref 0.2–1.2)
Total Protein: 7.2 g/dL (ref 6.0–8.3)

## 2019-01-11 LAB — HEMOGLOBIN A1C: Hgb A1c MFr Bld: 7 % — ABNORMAL HIGH (ref 4.6–6.5)

## 2019-01-11 LAB — LIPID PANEL
Cholesterol: 148 mg/dL (ref 0–200)
HDL: 65.5 mg/dL (ref >39.00)
LDL Cholesterol: 59 mg/dL (ref 0–99)
NonHDL: 82.29
Total CHOL/HDL Ratio: 2
Triglycerides: 118 mg/dL (ref 0.0–149.0)
VLDL: 23.6 mg/dL (ref 0.0–40.0)

## 2019-01-11 LAB — TSH: TSH: 1.48 u[IU]/mL (ref 0.35–4.50)

## 2019-01-11 NOTE — Assessment & Plan Note (Signed)
Continue statin Check lipid panel, CMP 

## 2019-01-11 NOTE — Assessment & Plan Note (Signed)
Blood pressure slightly elevated here today Given her age would rather be lenient with her blood pressure and have been on the higher side Continue current medications at current doses CMP

## 2019-01-11 NOTE — Assessment & Plan Note (Signed)
Mild anemia in March-likely dilutional from being in the hospital CBC, iron panel

## 2019-01-11 NOTE — Assessment & Plan Note (Signed)
Check A1c She is fairly compliant with a diabetic diet and sugars overall have been reasonably controlled Continue metformin

## 2019-01-11 NOTE — Assessment & Plan Note (Signed)
Euthyroid Check TSH We will titrate medication dose if needed

## 2019-01-11 NOTE — Addendum Note (Signed)
Addended by: Delice Bison E on: 01/11/2019 12:49 PM   Modules accepted: Orders

## 2019-01-11 NOTE — Assessment & Plan Note (Signed)
No chest pain, palpitations or shortness of breath Continue current medications Lipid panel, CBC, CMP

## 2019-01-11 NOTE — Assessment & Plan Note (Signed)
Has seen Dr. Jannifer Franklin Not currently on medications Her daughter states that her memory has been good about the same

## 2019-01-11 NOTE — Assessment & Plan Note (Signed)
Taking colchicine every other day No gout symptoms Continue

## 2019-01-12 ENCOUNTER — Encounter: Payer: Self-pay | Admitting: Internal Medicine

## 2019-01-12 LAB — IRON,TIBC AND FERRITIN PANEL
%SAT: 31 % (calc) (ref 16–45)
Ferritin: 48 ng/mL (ref 16–288)
Iron: 107 ug/dL (ref 45–160)
TIBC: 343 mcg/dL (calc) (ref 250–450)

## 2019-01-27 ENCOUNTER — Encounter: Payer: Self-pay | Admitting: Internal Medicine

## 2019-02-10 ENCOUNTER — Other Ambulatory Visit: Payer: Self-pay | Admitting: Internal Medicine

## 2019-02-21 ENCOUNTER — Ambulatory Visit: Payer: Self-pay | Admitting: *Deleted

## 2019-02-21 NOTE — Telephone Encounter (Signed)
Calls with high blood pressure today. This morning 169/90. Now, 139/79 HR  81 bpm. Had a sharp pain under the right breast earlier this morning that last only seconds and resolved. Denies sweating with the pain/dizziness/one-sided weakness/vision changes. Her blood pressure log reveals high pressures for approximately one week. She take medications daily for b/p. Daughter is requesting appointment for tomorrow morning. Reviewed urgent symptoms requiring immediate evaluation. No fever/no contacts/no travels. Warm transferred for appointment.   Reason for Disposition . Systolic BP  >= 99991111 OR Diastolic >= [id number]  Answer Assessment - Initial Assessment Questions 1. BLOOD PRESSURE: "What is the blood pressure?" "Did you take at least two measurements 5 minutes apart?"     B/P 169/90 this morning. 2. ONSET: "When did you take your blood pressure?"     Earlier this morning.  3. HOW: "How did you obtain the blood pressure?" (e.g., visiting nurse, automatic home BP monitor)     Home monitor 4. HISTORY: "Do you have a history of high blood pressure?"    Yes. 5. MEDICATIONS: "Are you taking any medications for blood pressure?" "Have you missed any doses recently?"     Yes, has not missed any doses. 6. OTHER SYMPTOMS: "Do you have any symptoms?" (e.g., headache, chest pain, blurred vision, difficulty breathing, weakness)     Pain underneath right breast started this morning. Headaches lately. 7. PREGNANCY: "Is there any chance you are pregnant?" "When was your last menstrual period?"    na  Protocols used: HIGH BLOOD PRESSURE-A-AH

## 2019-02-21 NOTE — Progress Notes (Signed)
Subjective:    Patient ID: Sheila Morgan, female    DOB: 05-31-1932, 85 y.o.   MRN: [id number]  HPI The patient is here for an acute visit.   Elevated BP:  Yesterday morning her BP was 169/90 and around lunchtime it was 139/79.  On average her BP has ben 150's/80's.  In the morning she had a sharp pain under her right breast that lasted only seconds.  She has not had any additional chest pain.  Yesterday her daughter thinks she was more confused and felt more weak.  Her daughter also states there was some slurred speech.  Her symptoms seem to last all day.  She did not sleep well the night prior and they were unsure if it was related to that.  Today it is better and she is back to her baseline.    They were worried about a possible urinary tract infection and if that would have caused the confusion.  She denies any pain with urinating or blood in the urine.  She does have frequent urination, but does not think there is anything change in her urine.   Medications and allergies reviewed with patient and updated if appropriate.  Patient Active Problem List   Diagnosis Date Noted  . Fall 07/14/2018  . Hearing loss 01/05/2018  . Memory difficulties 01/05/2018  . Multiple closed fractures of ribs of right side 01/19/2017  . Acute pain of right shoulder 01/05/2017  . Rib pain on right side 01/05/2017  . Acute pain of right knee 01/05/2017  . B12 deficiency 06/13/2015  . Hypothyroidism 06/13/2015  . DM (diabetes mellitus) type II, controlled, with peripheral vascular disorder ([place]) 03/06/2014  . Gout 10/15/2013  . Arthritis   . Hiatal hernia   . Coronary artery disease   . Left knee DJD   . Benign paroxysmal positional vertigo 05/19/2013  . Weakness generalized 05/09/2013  . Pseudogout of left knee 03/29/2013  . Acute right lumbar radiculopathy 03/18/2013  . Shoulder impingement syndrome 01/25/2013  . Vertigo 10/17/2012  . Renal insufficiency, mild 06/20/2011  . Essential  hypertension 12/31/2009  . Osteoporosis 12/31/2009  . SKIN CANCER, HX OF 12/31/2009  . Proteinuria 01/05/2008  . Anemia 12/21/2007  . Hyperlipidemia 09/08/2006  . Polymyalgia rheumatica ([place]) 09/08/2006  . CORONARY ARTERY BYPASS GRAFT, HX OF 09/08/2006    Current Outpatient Medications on File Prior to Visit  Medication Sig Dispense Refill  . acetaminophen (TYLENOL) 325 MG tablet Take 2 tablets (650 mg total) by mouth every 6 (six) hours as needed for mild pain, fever or headache.    . alendronate (FOSAMAX) 70 MG tablet Take 70 mg by mouth every Saturday. Take with a full glass of water on an empty stomach.     Marland Kitchen amLODipine (NORVASC) 5 MG tablet TAKE 1 TABLET(5 MG) BY MOUTH DAILY 90 tablet 1  . aspirin 81 MG tablet Take 81 mg by mouth daily.    . Blood Glucose Monitoring Suppl (ONE TOUCH ULTRA MINI) W/DEVICE KIT Use to test blood sugar twice daily ICD 10 E11 21 1 each 0  . Calcium Carbonate-Vitamin D (CALCIUM 600-D PO) Take 1 tablet by mouth 2 (two) times daily.     . carvedilol (COREG) 25 MG tablet TAKE 1 TABLET(25 MG) BY MOUTH TWICE DAILY WITH A MEAL 180 tablet 1  . colchicine (COLCRYS) 0.6 MG tablet Take 1 tablet (0.6 mg total) by mouth every other day. 45 tablet 3  . furosemide (LASIX) 20 MG tablet TAKE  1 TABLET(20 MG) BY MOUTH DAILY 90 tablet 1  . glucose blood (ONE TOUCH ULTRA TEST) test strip USE TO TEST BLOOD SUGAR TWICE DAILY Dx: E11.51 100 each 3  . isosorbide mononitrate (IMDUR) 30 MG 24 hr tablet TAKE 1 TABLET(30 MG) BY MOUTH DAILY 90 tablet 1  . levothyroxine (SYNTHROID) 50 MCG tablet Take 1 tablet (50 mcg total) by mouth daily. 90 tablet 0  . losartan (COZAAR) 100 MG tablet Take 1 tablet (100 mg total) by mouth daily. 90 tablet 1  . metFORMIN (GLUCOPHAGE) 500 MG tablet Take 0.5 tablets (250 mg total) by mouth daily. Take 1/2 tablet by mouth daily with largest meal 45 tablet 1  . OneTouch Delica Lancets 66Z MISC TEST BLOOD SUGAR TWICE DAILY 200 each 1  . predniSONE (DELTASONE)  5 MG tablet Take 5 mg by mouth daily.    . simvastatin (ZOCOR) 40 MG tablet TAKE 1 TABLET(40 MG) BY MOUTH AT BEDTIME 90 tablet 1  . vitamin B-12 (CYANOCOBALAMIN) 1000 MCG tablet Take 1,000 mcg by mouth daily.       No current facility-administered medications on file prior to visit.     Past Medical History:  Diagnosis Date  . Arthritis   . Cancer ([place])    skin on nose .  mylenoma  . Coronary artery disease    status post post LAD angioplasty in 1993  and subsequent coronary artery bypass grafting x1 with a LIMA to the LAD July of 1994. She had stenting of her RCA with a bare-metal stent November 2002.  . Diabetes mellitus    2  . Gout   . Heart murmur   . Hematoma    Right groin  . Hemorrhoids   . Hiatal hernia   . History of blood transfusion   . HOH (hard of hearing)   . Hyperlipidemia   . Hypertension   . Left knee DJD   . Lightheadedness    has seen Dr Gwenlyn Found and ENT.. cant find out why  . Myocardial infarction ([place])   . Osteoporosis   . PONV (postoperative nausea and vomiting)   . Shingles 10/27/2013    Past Surgical History:  Procedure Laterality Date  . CORONARY ANGIOPLASTY  1993   LAD  . CORONARY ANGIOPLASTY WITH STENT PLACEMENT  03/09/01   RCA  . CORONARY ARTERY BYPASS GRAFT  1994  . JOINT REPLACEMENT Right 1998   hip  . NM MYOVIEW LTD  03/2012   Normal  . SKIN GRAFT     to nose skin cancer  . TOTAL HIP ARTHROPLASTY    . TOTAL KNEE ARTHROPLASTY Left 10/09/2013   Procedure: TOTAL KNEE ARTHROPLASTY;  Surgeon: Lorn Junes, MD;  Location: [facility];  Service: Orthopedics;  Laterality: Left;    Social History   Socioeconomic History  . Marital status: Widowed    Spouse name: Not on file  . Number of children: 2  . Years of education: Not on file  . Highest education level: 12th grade  Occupational History  . Occupation: Retired   Scientific laboratory technician  . Financial resource strain: Not hard at all  . Food insecurity    Worry: Never true    Inability: Never  true  . Transportation needs    Medical: No    Non-medical: No  Tobacco Use  . Smoking status: Never Smoker  . Smokeless tobacco: Never Used  Substance and Sexual Activity  . Alcohol use: No  . Drug use: No  . Sexual activity:  Never  Lifestyle  . Physical activity    Days per week: 0 days    Minutes per session: 0 min  . Stress: Not at all  Relationships  . Social connections    Talks on phone: More than three times a week    Gets together: More than three times a week    Attends religious service: More than 4 times per year    Active member of club or organization: Yes    Attends meetings of clubs or organizations: More than 4 times per year    Relationship status: Widowed  Other Topics Concern  . Not on file  Social History Narrative   Left handed    Caffeine 4 cups daily    Lives at home alone     Family History  Problem Relation Age of Onset  . Heart disease Father   . Cancer Sister        breast  . Diabetes Sister   . Heart disease Brother   . Heart disease Brother   . Heart disease Brother   . Diabetes Sister   . Stroke Mother     Review of Systems  Constitutional: Negative for chills and fever.  Respiratory: Positive for cough (clear mucus in morning). Negative for shortness of breath and wheezing.   Cardiovascular: Positive for chest pain (one episode yesterday on right side, lasts one sec). Negative for palpitations and leg swelling.  Gastrointestinal: Positive for constipation. Negative for abdominal pain, diarrhea and nausea.  Genitourinary: Positive for frequency. Negative for dysuria and hematuria.  Musculoskeletal: Positive for back pain.  Neurological: Positive for dizziness, weakness (generalized), light-headedness and headaches (occ).  Psychiatric/Behavioral: Positive for confusion (yesterday).       Objective:   Vitals:   02/22/19 0910  BP: (!) 158/74  Pulse: 71  Resp: 16  Temp: 98.2 F (36.8 C)  SpO2: 99%   BP Readings from Last 3  Encounters:  02/22/19 (!) 158/74  01/11/19 (!) 152/80  12/08/18 (!) 183/88   Wt Readings from Last 3 Encounters:  02/22/19 160 lb (72.6 kg)  01/11/19 158 lb (71.7 kg)  12/08/18 157 lb 12.8 oz (71.6 kg)   Body mass index is 25.06 kg/m.   Physical Exam    Constitutional: Appears well-developed and well-nourished. No distress.  HENT:  Head: Normocephalic and atraumatic.  Neck: Neck supple. No tracheal deviation present. No thyromegaly present.  No cervical lymphadenopathy Cardiovascular: Normal rate, regular rhythm and normal heart sounds.  No murmur heard. No carotid bruit .  No edema Pulmonary/Chest: Effort normal and breath sounds normal. No respiratory distress. No has no wheezes. No rales.  Abdomen: Soft, nontender, nondistended Neurological: Nonfocal, speech is clear, no confusion Skin: Skin is warm and dry. Not diaphoretic.  Psychiatric: Normal mood and affect. Behavior is normal.       Assessment & Plan:    See Problem List for Assessment and Plan of chronic medical problems.

## 2019-02-21 NOTE — Telephone Encounter (Signed)
FYI

## 2019-02-22 ENCOUNTER — Ambulatory Visit (INDEPENDENT_AMBULATORY_CARE_PROVIDER_SITE_OTHER): Payer: Medicare Other | Admitting: Internal Medicine

## 2019-02-22 ENCOUNTER — Encounter: Payer: Self-pay | Admitting: Internal Medicine

## 2019-02-22 ENCOUNTER — Other Ambulatory Visit (INDEPENDENT_AMBULATORY_CARE_PROVIDER_SITE_OTHER): Payer: Medicare Other

## 2019-02-22 ENCOUNTER — Other Ambulatory Visit: Payer: Self-pay | Admitting: Internal Medicine

## 2019-02-22 ENCOUNTER — Other Ambulatory Visit: Payer: Self-pay

## 2019-02-22 VITALS — BP 158/74 | HR 71 | Temp 98.2°F | Resp 16 | Ht 67.0 in | Wt 160.0 lb

## 2019-02-22 DIAGNOSIS — R350 Frequency of micturition: Secondary | ICD-10-CM | POA: Diagnosis not present

## 2019-02-22 DIAGNOSIS — R410 Disorientation, unspecified: Secondary | ICD-10-CM | POA: Diagnosis not present

## 2019-02-22 DIAGNOSIS — I2510 Atherosclerotic heart disease of native coronary artery without angina pectoris: Secondary | ICD-10-CM | POA: Diagnosis not present

## 2019-02-22 DIAGNOSIS — I2583 Coronary atherosclerosis due to lipid rich plaque: Secondary | ICD-10-CM | POA: Diagnosis not present

## 2019-02-22 DIAGNOSIS — I10 Essential (primary) hypertension: Secondary | ICD-10-CM

## 2019-02-22 LAB — URINALYSIS, ROUTINE W REFLEX MICROSCOPIC
Bilirubin Urine: NEGATIVE
Hgb urine dipstick: NEGATIVE
Ketones, ur: NEGATIVE
Nitrite: NEGATIVE
Specific Gravity, Urine: 1.01 (ref 1.000–1.030)
Total Protein, Urine: NEGATIVE
Urine Glucose: NEGATIVE
Urobilinogen, UA: 0.2 (ref 0.0–1.0)
pH: 6.5 (ref 5.0–8.0)

## 2019-02-22 MED ORDER — LEVOTHYROXINE SODIUM 50 MCG PO TABS: 50.0000 ug | ORAL_TABLET | Freq: Every day | ORAL | 1 refills | Status: DC

## 2019-02-22 MED ORDER — AMLODIPINE BESYLATE 5 MG PO TABS: 7.5000 mg | ORAL_TABLET | Freq: Every day | ORAL | 1 refills | Status: DC

## 2019-02-22 NOTE — Patient Instructions (Addendum)
Go to the lab and give a urine sample.   Increase amlodipine to 7.5 mg daily.  Monitor Bp at home and let me know your readings in one week.   Make an appointment with Dr Jannifer Franklin to discuss the confusion.

## 2019-02-22 NOTE — Assessment & Plan Note (Signed)
Confusion associated with slurred speech yesterday She did not sleep well the night before, which could be a potential cause Family concerned about possible UTI, which seems less likely since she is back at her baseline today, but will check urinalysis, culture Could be related to her dementia Concern for possible TIA-she is taking her statin and baby aspirin.  Discussed following up with Dr. Jannifer Franklin to discuss this further and to further evaluate Per daughter is okay with monitoring for now see if this recurs

## 2019-02-22 NOTE — Assessment & Plan Note (Signed)
Not ideally controlled Increase amlodipine to 7.5 mg daily Continue other medications Family will monitor blood pressure at home and let me know her readings If still elevated can try increasing amlodipine to 10 mg daily, but given her age concern for possible orthostatic hypotension

## 2019-02-22 NOTE — Assessment & Plan Note (Signed)
Some urinary frequency, but no major changes in urination Yesterday there was some confusion Unlikely UTI, but will check urinalysis, urine culture

## 2019-02-24 LAB — URINE CULTURE
MICRO NUMBER:: 1013971
SPECIMEN QUALITY:: ADEQUATE

## 2019-03-05 ENCOUNTER — Other Ambulatory Visit: Payer: Self-pay | Admitting: Internal Medicine

## 2019-03-11 ENCOUNTER — Other Ambulatory Visit: Payer: Self-pay | Admitting: Internal Medicine

## 2019-03-20 ENCOUNTER — Encounter: Payer: Self-pay | Admitting: Podiatry

## 2019-03-20 ENCOUNTER — Ambulatory Visit (INDEPENDENT_AMBULATORY_CARE_PROVIDER_SITE_OTHER): Payer: Medicare Other | Admitting: Podiatry

## 2019-03-20 ENCOUNTER — Other Ambulatory Visit: Payer: Self-pay

## 2019-03-20 DIAGNOSIS — B351 Tinea unguium: Secondary | ICD-10-CM

## 2019-03-20 DIAGNOSIS — E1142 Type 2 diabetes mellitus with diabetic polyneuropathy: Secondary | ICD-10-CM

## 2019-03-20 DIAGNOSIS — M79675 Pain in left toe(s): Secondary | ICD-10-CM | POA: Diagnosis not present

## 2019-03-20 DIAGNOSIS — L84 Corns and callosities: Secondary | ICD-10-CM

## 2019-03-20 DIAGNOSIS — E1151 Type 2 diabetes mellitus with diabetic peripheral angiopathy without gangrene: Secondary | ICD-10-CM

## 2019-03-20 DIAGNOSIS — M79674 Pain in right toe(s): Secondary | ICD-10-CM

## 2019-03-20 NOTE — Patient Instructions (Signed)
Diabetes Mellitus and Foot Care Foot care is an important part of your health, especially when you have diabetes. Diabetes may cause you to have problems because of poor blood flow (circulation) to your feet and legs, which can cause your skin to:  Become thinner and drier.  Break more easily.  Heal more slowly.  Peel and crack. You may also have nerve damage (neuropathy) in your legs and feet, causing decreased feeling in them. This means that you may not notice minor injuries to your feet that could lead to more serious problems. Noticing and addressing any potential problems early is the best way to prevent future foot problems. How to care for your feet Foot hygiene  Wash your feet daily with warm water and mild soap. Do not use hot water. Then, pat your feet and the areas between your toes until they are completely dry. Do not soak your feet as this can dry your skin.  Trim your toenails straight across. Do not dig under them or around the cuticle. File the edges of your nails with an emery board or nail file.  Apply a moisturizing lotion or petroleum jelly to the skin on your feet and to dry, brittle toenails. Use lotion that does not contain alcohol and is unscented. Do not apply lotion between your toes. Shoes and socks  Wear clean socks or stockings every day. Make sure they are not too tight. Do not wear knee-high stockings since they may decrease blood flow to your legs.  Wear shoes that fit properly and have enough cushioning. Always look in your shoes before you put them on to be sure there are no objects inside.  To break in new shoes, wear them for just a few hours a day. This prevents injuries on your feet. Wounds, scrapes, corns, and calluses  Check your feet daily for blisters, cuts, bruises, sores, and redness. If you cannot see the bottom of your feet, use a mirror or ask someone for help.  Do not cut corns or calluses or try to remove them with medicine.  If you  find a minor scrape, cut, or break in the skin on your feet, keep it and the skin around it clean and dry. You may clean these areas with mild soap and water. Do not clean the area with peroxide, alcohol, or iodine.  If you have a wound, scrape, corn, or callus on your foot, look at it several times a day to make sure it is healing and not infected. Check for: ? Redness, swelling, or pain. ? Fluid or blood. ? Warmth. ? Pus or a bad smell. General instructions  Do not cross your legs. This may decrease blood flow to your feet.  Do not use heating pads or hot water bottles on your feet. They may burn your skin. If you have lost feeling in your feet or legs, you may not know this is happening until it is too late.  Protect your feet from hot and cold by wearing shoes, such as at the beach or on hot pavement.  Schedule a complete foot exam at least once a year (annually) or more often if you have foot problems. If you have foot problems, report any cuts, sores, or bruises to your health care provider immediately. Contact a health care provider if:  You have a medical condition that increases your risk of infection and you have any cuts, sores, or bruises on your feet.  You have an injury that is not   healing.  You have redness on your legs or feet.  You feel burning or tingling in your legs or feet.  You have pain or cramps in your legs and feet.  Your legs or feet are numb.  Your feet always feel cold.  You have pain around a toenail. Get help right away if:  You have a wound, scrape, corn, or callus on your foot and: ? You have pain, swelling, or redness that gets worse. ? You have fluid or blood coming from the wound, scrape, corn, or callus. ? Your wound, scrape, corn, or callus feels warm to the touch. ? You have pus or a bad smell coming from the wound, scrape, corn, or callus. ? You have a fever. ? You have a red line going up your leg. Summary  Check your feet every day  for cuts, sores, red spots, swelling, and blisters.  Moisturize feet and legs daily.  Wear shoes that fit properly and have enough cushioning.  If you have foot problems, report any cuts, sores, or bruises to your health care provider immediately.  Schedule a complete foot exam at least once a year (annually) or more often if you have foot problems. This information is not intended to replace advice given to you by your health care provider. Make sure you discuss any questions you have with your health care provider. Document Released: 04/17/2000 Document Revised: 06/02/2017 Document Reviewed: 05/22/2016 Elsevier Patient Education  2020 [employer] Inc[employer]  

## 2019-03-24 ENCOUNTER — Other Ambulatory Visit: Payer: Self-pay | Admitting: Cardiovascular Disease

## 2019-03-26 NOTE — Progress Notes (Signed)
Subjective: Sheila Morgan presents today for preventative diabetic foot care with cc of painful calluses and  Elongated, mycotic toenails b/l which interfere with activities of daily living. Pain is aggravated when wearing enclosed shoe gear. Pain is relieved with periodic professional debridement.  Her daughter is present during the visit.  Current Outpatient Medications on File Prior to Visit  Medication Sig Dispense Refill  . acetaminophen (TYLENOL) 325 MG tablet Take 2 tablets (650 mg total) by mouth every 6 (six) hours as needed for mild pain, fever or headache.    . alendronate (FOSAMAX) 70 MG tablet Take 70 mg by mouth every Saturday. Take with a full glass of water on an empty stomach.     Marland Kitchen amLODipine (NORVASC) 5 MG tablet Take 1.5 tablets (7.5 mg total) by mouth daily. 135 tablet 1  . aspirin 81 MG tablet Take 81 mg by mouth daily.    . Blood Glucose Monitoring Suppl (ONE TOUCH ULTRA MINI) W/DEVICE KIT Use to test blood sugar twice daily ICD 10 E11 21 1 each 0  . Calcium Carbonate-Vitamin D (CALCIUM 600-D PO) Take 1 tablet by mouth 2 (two) times daily.     . carvedilol (COREG) 25 MG tablet TAKE 1 TABLET(25 MG) BY MOUTH TWICE DAILY WITH A MEAL 180 tablet 1  . colchicine (COLCRYS) 0.6 MG tablet Take 1 tablet (0.6 mg total) by mouth every other day. 45 tablet 3  . diclofenac Sodium (VOLTAREN) 1 % GEL Apply topically.    . furosemide (LASIX) 20 MG tablet TAKE 1 TABLET(20 MG) BY MOUTH DAILY 90 tablet 1  . glucose blood (ONE TOUCH ULTRA TEST) test strip USE TO TEST BLOOD SUGAR TWICE DAILY Dx: E11.51 100 each 3  . isosorbide mononitrate (IMDUR) 30 MG 24 hr tablet TAKE 1 TABLET(30 MG) BY MOUTH DAILY 90 tablet 1  . levothyroxine (SYNTHROID) 50 MCG tablet Take 1 tablet (50 mcg total) by mouth daily. 90 tablet 1  . losartan (COZAAR) 100 MG tablet TAKE 1 TABLET(100 MG) BY MOUTH DAILY 90 tablet 1  . metFORMIN (GLUCOPHAGE) 500 MG tablet Take 0.5 tablets (250 mg total) by mouth daily. Take 1/2  tablet by mouth daily with largest meal 45 tablet 1  . OneTouch Delica Lancets 85U MISC TEST BLOOD SUGAR TWICE DAILY 200 each 1  . predniSONE (DELTASONE) 5 MG tablet Take 5 mg by mouth daily.    . vitamin B-12 (CYANOCOBALAMIN) 1000 MCG tablet Take 1,000 mcg by mouth daily.       No current facility-administered medications on file prior to visit.     No Known Allergies  Objective: 85 y.o. AAF, elderly in NAD. AAO x 3.  Vascular Examination: Capillary refill time <3 seconds b/l.  Dorsalis pedis pulses palpable b/l.  Posterior tibial pulses nonpalpable b/l.  Digital hair absent b/l.   Skin temperature gradient WNL b/l.  Dermatological Examination: Skin with normal turgor, texture and tone b/l.  Toenails 1-5 b/l discolored, thick, dystrophic with subungual debris and pain with palpation to nailbeds due to thickness of nails.  Hyperkeratotic lesion submet head 5 b/l with tenderness to palpation. No edema, no erythema, no drainage, no flocculence.  Musculoskeletal: Muscle strength 5/5 to all LE muscle groups.  No pain, crepitus or joint limitation with passive/active ROM.  Neurological: Sensation diminished with 10 gram monofilament bilaterally.  Vibratory sensation diminished bilaterally.  Assessment: 1. Painful onychomycosis toenails 1-5 b/l 2. Calluses submet head 5 b/l 3. NIDDM with Diabetic neuropathy and PAD  Plan: 1. Continue  diabetic foot care principles. Literature dispensed on today. 2. Toenails 1-5 b/l were debrided in length and girth without iatrogenic bleeding. 3. Calluses pared submetatarsal head 5 b/lutilizing sterile scalpel blade without incident.  4. Patient to continue soft, supportive shoe gear. 5. Patient to report any pedal injuries to medical professional. 6. Follow up 3 months.  7. Patient/POA to call should there be a concern in the interim.

## 2019-04-11 ENCOUNTER — Other Ambulatory Visit: Payer: Self-pay | Admitting: Internal Medicine

## 2019-04-11 DIAGNOSIS — H349 Unspecified retinal vascular occlusion: Secondary | ICD-10-CM | POA: Diagnosis not present

## 2019-04-12 ENCOUNTER — Telehealth: Payer: Self-pay | Admitting: Internal Medicine

## 2019-04-12 ENCOUNTER — Encounter: Payer: Self-pay | Admitting: Internal Medicine

## 2019-04-12 MED ORDER — AMLODIPINE BESYLATE 5 MG PO TABS: 7.5000 mg | ORAL_TABLET | Freq: Every day | ORAL | 1 refills | Status: DC

## 2019-04-12 NOTE — Telephone Encounter (Signed)
Copied from [facility] [id number]. Topic: General - Other >> Apr 12, 2019  2:22 PM Keene Breath wrote: Reason for CRM: Patient's daughter, [name], would like the nurse to call her to discuss her mom's medication, amLODipine (NORVASC) 5 MG tablet.  The dosage was changed and she is running out of it quicker than when the script is due for refill.  Please call to discuss at [phone]

## 2019-04-12 NOTE — Telephone Encounter (Signed)
Rx sent. Responded to daughter through [facility].

## 2019-04-21 ENCOUNTER — Other Ambulatory Visit: Payer: Self-pay | Admitting: Internal Medicine

## 2019-05-02 ENCOUNTER — Other Ambulatory Visit: Payer: Self-pay | Admitting: Internal Medicine

## 2019-05-09 ENCOUNTER — Other Ambulatory Visit: Payer: Self-pay | Admitting: Internal Medicine

## 2019-06-12 ENCOUNTER — Other Ambulatory Visit: Payer: Self-pay | Admitting: Internal Medicine

## 2019-06-13 ENCOUNTER — Ambulatory Visit (INDEPENDENT_AMBULATORY_CARE_PROVIDER_SITE_OTHER): Payer: Medicare Other | Admitting: Neurology

## 2019-06-13 ENCOUNTER — Other Ambulatory Visit: Payer: Self-pay

## 2019-06-13 ENCOUNTER — Encounter: Payer: Self-pay | Admitting: Neurology

## 2019-06-13 VITALS — BP 179/79 | HR 74 | Temp 97.3°F | Ht 67.0 in | Wt 161.6 lb

## 2019-06-13 DIAGNOSIS — R413 Other amnesia: Secondary | ICD-10-CM

## 2019-06-13 NOTE — Progress Notes (Signed)
PATIENT: Sheila Morgan DOB: 03/29/1933  REASON FOR VISIT: follow up HISTORY FROM: patient  HISTORY OF PRESENT ILLNESS: Today 06/13/19  Sheila Morgan is an 86 year old female with history of memory disturbance.  CT scan of the brain in February 2020 showed moderate level small vessel disease, likely impacting her dizziness, balance, and possibly memory, but no real change from prior study in 2017.  She has not wish to start memory medications.  She continues to live alone.  She feels her memory is stable.  She has not had any falls.  She is able to perform her own ADLs.  She has family close by.  Her daughter handles her finances and medications.  She reports she has a good appetite.  She continues to drive a car short distances.  There have been some episodes of her getting lost going to her daughter's house.  She does wear hearing aids, but not all the time, sometimes her family wonders if her hearing impacts her memory.  She uses a cane for ambulation when she goes out.  She has difficulty organizing her thoughts, and some word finding.  She has a life alert.  She denies any new problems or concerns.  She presents today for evaluation accompanied by her granddaughter.  HISTORY 12/08/2018 SS: Sheila Morgan is an 86 year old female with history of memory disturbance.  Her last memory score was 25/30.  In the past she has had MRI of the brain that showed a moderate level small vessel disease that could contribute to her reported dizziness and gait instability, as well as memory problems.  She had CT scan of the brain in February 2020, it continued to show moderate level small vessel disease likely impacting her dizziness, balance, and possibly memory.  There is no real change from the prior study in 2017.  In the past she is decided to not go on memory medications. She had a fall in March 2020, it was unwitnessed, she fractured a rib. She had PT/OT come to her home. She lives alone. She has a life alert.  She has a stair lift in the home and a shower chair.  She is able to perform her own ADLs. She has family close by. Her daughter and son come daily. She does her laundry and some light cooking. Her daughter handles the finances and medications. She drives a car only short distances. There haven't been any driving accidents or getting lost. Her appetite is good. 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 She had 1 fall since March, lost her balance bending over. She wasn't injured. She uses a cane and walker.  She presents for follow-up accompanied by her daughter.   REVIEW OF SYSTEMS: Out of a complete 14 system review of symptoms, the patient complains only of the following symptoms, and all other reviewed systems are negative.  Memory loss  ALLERGIES: No Known Allergies  HOME MEDICATIONS: Outpatient Medications Prior to Visit  Medication Sig Dispense Refill  . acetaminophen (TYLENOL) 325 MG tablet Take 2 tablets (650 mg total) by mouth every 6 (six) hours as needed for mild pain, fever or headache.    . alendronate (FOSAMAX) 70 MG tablet Take 70 mg by mouth every Saturday. Take with a full glass of water on an empty stomach.     Marland Kitchen amLODipine (NORVASC) 5 MG tablet Take 1.5 tablets (7.5 mg total) by mouth daily. 135 tablet 1  . aspirin 81 MG tablet Take  81 mg by mouth daily.    . Blood Glucose Monitoring Suppl (ONE TOUCH ULTRA MINI) W/DEVICE KIT Use to test blood sugar twice daily ICD 10 E11 21 1 each 0  . Calcium Carbonate-Vitamin D (CALCIUM 600-D PO) Take 1 tablet by mouth 2 (two) times daily.     . carvedilol (COREG) 25 MG tablet TAKE 1 TABLET(25 MG) BY MOUTH TWICE DAILY WITH A MEAL 180 tablet 1  . colchicine 0.6 MG tablet TAKE 1 TABLET(0.6 MG) BY MOUTH EVERY OTHER DAY 45 tablet 0  . diclofenac Sodium (VOLTAREN) 1 % GEL Apply topically.    . furosemide (LASIX) 20 MG tablet TAKE 1 TABLET(20 MG) BY MOUTH DAILY 90 tablet 1  . isosorbide mononitrate (IMDUR) 30 MG 24 hr  tablet TAKE 1 TABLET(30 MG) BY MOUTH DAILY 90 tablet 1  . levothyroxine (SYNTHROID) 50 MCG tablet Take 1 tablet (50 mcg total) by mouth daily. 90 tablet 1  . losartan (COZAAR) 100 MG tablet TAKE 1 TABLET(100 MG) BY MOUTH DAILY 90 tablet 1  . metFORMIN (GLUCOPHAGE) 500 MG tablet TAKE 1/2 TABLET(250 MG) BY MOUTH DAILY WITH LARGEST MEAL 45 tablet 2  . OneTouch Delica Lancets 65H MISC TEST BLOOD SUGAR TWICE DAILY 200 each 1  . ONETOUCH ULTRA test strip USE TO TEST BLOOD SUGAR TWICE DAILY 200 strip 3  . predniSONE (DELTASONE) 5 MG tablet Take 5 mg by mouth daily.    . simvastatin (ZOCOR) 40 MG tablet TAKE 1 TABLET(40 MG) BY MOUTH AT BEDTIME 90 tablet 1  . vitamin B-12 (CYANOCOBALAMIN) 1000 MCG tablet Take 1,000 mcg by mouth daily.       No facility-administered medications prior to visit.    PAST MEDICAL HISTORY: Past Medical History:  Diagnosis Date  . Arthritis   . Cancer ([place])    skin on nose .  mylenoma  . Coronary artery disease    status post post LAD angioplasty in 1993  and subsequent coronary artery bypass grafting x1 with a LIMA to the LAD July of 1994. She had stenting of her RCA with a bare-metal stent November 2002.  . Diabetes mellitus    2  . Gout   . Heart murmur   . Hematoma    Right groin  . Hemorrhoids   . Hiatal hernia   . History of blood transfusion   . HOH (hard of hearing)   . Hyperlipidemia   . Hypertension   . Left knee DJD   . Lightheadedness    has seen Dr Gwenlyn Found and ENT.. cant find out why  . Myocardial infarction ([place])   . Osteoporosis   . PONV (postoperative nausea and vomiting)   . Shingles 10/27/2013    PAST SURGICAL HISTORY: Past Surgical History:  Procedure Laterality Date  . CORONARY ANGIOPLASTY  1993   LAD  . CORONARY ANGIOPLASTY WITH STENT PLACEMENT  03/09/01   RCA  . CORONARY ARTERY BYPASS GRAFT  1994  . JOINT REPLACEMENT Right 1998   hip  . NM MYOVIEW LTD  03/2012   Normal  . SKIN GRAFT     to nose skin cancer  . TOTAL HIP  ARTHROPLASTY    . TOTAL KNEE ARTHROPLASTY Left 10/09/2013   Procedure: TOTAL KNEE ARTHROPLASTY;  Surgeon: Lorn Junes, MD;  Location: [facility];  Service: Orthopedics;  Laterality: Left;    FAMILY HISTORY: Family History  Problem Relation Age of Onset  . Heart disease Father   . Cancer Sister  breast  . Diabetes Sister   . Heart disease Brother   . Heart disease Brother   . Heart disease Brother   . Diabetes Sister   . Stroke Mother     SOCIAL HISTORY: Social History   Socioeconomic History  . Marital status: Widowed    Spouse name: Not on file  . Number of children: 2  . Years of education: Not on file  . Highest education level: 12th grade  Occupational History  . Occupation: Retired   Tobacco Use  . Smoking status: Never Smoker  . Smokeless tobacco: Never Used  Substance and Sexual Activity  . Alcohol use: No  . Drug use: No  . Sexual activity: Never  Other Topics Concern  . Not on file  Social History Narrative   Left handed    Caffeine 4 cups daily    Lives at home alone    Social Determinants of Health   Financial Resource Strain:   . Difficulty of Paying Living Expenses: Not on file  Food Insecurity:   . Worried About Charity fundraiser in the Last Year: Not on file  . Ran Out of Food in the Last Year: Not on file  Transportation Needs:   . Lack of Transportation (Medical): Not on file  . Lack of Transportation (Non-Medical): Not on file  Physical Activity:   . Days of Exercise per Week: Not on file  . Minutes of Exercise per Session: Not on file  Stress:   . Feeling of Stress : Not on file  Social Connections: Unknown  . Frequency of Communication with Friends and Family: Not on file  . Frequency of Social Gatherings with Friends and Family: Not on file  . Attends Religious Services: More than 4 times per year  . Active Member of Clubs or Organizations: Yes  . Attends Archivist Meetings: More than 4 times per year  . Marital  Status: Not on file  Intimate Partner Violence:   . Fear of Current or Ex-Partner: Not on file  . Emotionally Abused: Not on file  . Physically Abused: Not on file  . Sexually Abused: Not on file   PHYSICAL EXAM  Vitals:   06/13/19 0854  BP: (!) 179/79  Pulse: 74  Temp: (!) 97.3 F (36.3 C)  TempSrc: Oral  Weight: 161 lb 9.6 oz (73.3 kg)  Height: 5' 7" (1.702 m)   Body mass index is 25.31 kg/m.  Generalized: Well developed, in no acute distress  MMSE - Mini Mental State Exam 06/13/2019 12/08/2018 07/06/2018  Not completed: (No Data) - (No Data)  Orientation to time 5 4 -  Orientation to Place 3 4 -  Registration 3 3 -  Attention/ Calculation 0 2 -  Recall 1 2 -  Language- name 2 objects 2 2 -  Language- repeat 0 1 -  Language- follow 3 step command 3 3 -  Language- read & follow direction 1 1 -  Write a sentence 1 1 -  Copy design 0 1 -  Copy design-comments - 7 animals -  Total score 19 24 -    Neurological examination  Mentation: Alert oriented to time, place, history taking. Follows all commands, mild delayed with speech Cranial nerve II-XII: Pupils were equal round reactive to light. Extraocular movements were full, visual field were full on confrontational test. Facial sensation and strength were normal. Head turning and shoulder shrug were normal and symmetric. Motor: Good strength of all extremities Sensory: Sensory  testing is intact to soft touch on all 4 extremities. No evidence of extinction is noted.  Coordination: Cerebellar testing reveals good finger-nose-finger and heel-to-shin bilaterally.  Gait and station: Slow to rise from seated position, has to push off, no assistive device, gait is slow, cautious Reflexes: Deep tendon reflexes are symmetric   DIAGNOSTIC DATA (LABS, IMAGING, TESTING) - I reviewed patient records, labs, notes, testing and imaging myself where available.  Lab Results  Component Value Date   WBC 9.4 01/11/2019   HGB 13.6 01/11/2019     HCT 40.1 01/11/2019   MCV 91.5 01/11/2019   PLT 181.0 01/11/2019      Component Value Date/Time   NA 141 01/11/2019 0937   K 3.9 01/11/2019 0937   CL 103 01/11/2019 0937   CO2 31 01/11/2019 0937   GLUCOSE 140 (H) 01/11/2019 0937   BUN 17 01/11/2019 0937   CREATININE 1.17 01/11/2019 0937   CALCIUM 10.7 (H) 01/11/2019 0937   PROT 7.2 01/11/2019 0937   PROT 7.2 06/17/2017 0838   ALBUMIN 4.1 01/11/2019 0937   ALBUMIN 4.5 06/17/2017 0838   AST 16 01/11/2019 0937   ALT 15 01/11/2019 0937   ALKPHOS 64 01/11/2019 0937   BILITOT 0.5 01/11/2019 0937   BILITOT 0.6 06/17/2017 0838   GFRNONAA 41 (L) 07/15/2018 1414   GFRAA 48 (L) 07/15/2018 1414   Lab Results  Component Value Date   CHOL 148 01/11/2019   HDL 65.50 01/11/2019   LDLCALC 59 01/11/2019   TRIG 118.0 01/11/2019   CHOLHDL 2 01/11/2019   Lab Results  Component Value Date   HGBA1C 7.0 (H) 01/11/2019   Lab Results  Component Value Date   VITAMINB12 >2000 (H) 06/09/2018   Lab Results  Component Value Date   TSH 1.48 01/11/2019    ASSESSMENT AND PLAN 86 y.o. year old female  has a past medical history of Arthritis, Cancer ([place]), Coronary artery disease, Diabetes mellitus, Gout, Heart murmur, Hematoma, Hemorrhoids, Hiatal hernia, History of blood transfusion, HOH (hard of hearing), Hyperlipidemia, Hypertension, Left knee DJD, Lightheadedness, Myocardial infarction ([place]), Osteoporosis, PONV (postoperative nausea and vomiting), and Shingles (10/27/2013). here with:  1. Memory disturbance 2. Small vessel disease by CT scan 06/17/2018 3.  Gait instability  She has had a decline in her memory score.  She does not wish to start memory medications.  I suggested she no longer drive a car, due to report of her getting lost.  Her grand daughter accompanies her today, who is a speech therapist, feels that she could benefit from some cognitive training.  I will place a referral for neuro rehab for speech therapy consultation.  I  have encouraged her to use her cane for safe ambulation, to prevent falls.  She will continue follow-up with her primary doctor. BP is elevated today, she did not take her medications. We have discussed Aricept and Namenda, they will consider the medications.  She will follow-up in 8 months or sooner if needed.   I spent 15 minutes with the patient. 50% of this time was spent discussing her plan of care.   Butler Denmark, AGNP-C, DNP 06/13/2019, 9:28 AM [facility] [address] [place], [place] [address] [phone]

## 2019-06-13 NOTE — Patient Instructions (Signed)
If you change your mind we may consider Aricept or Namenda, which are memory medications. I will order the speech therapy to help with cognitive training. Please be very careful not to fall, use your cane!

## 2019-06-14 NOTE — Progress Notes (Signed)
I have read the note, and I agree with the clinical assessment and plan.  Nabila Albarracin K Cecila Satcher   

## 2019-06-15 DIAGNOSIS — M170 Bilateral primary osteoarthritis of knee: Secondary | ICD-10-CM | POA: Diagnosis not present

## 2019-06-15 DIAGNOSIS — M1189 Other specified crystal arthropathies, multiple sites: Secondary | ICD-10-CM | POA: Diagnosis not present

## 2019-06-15 DIAGNOSIS — M1711 Unilateral primary osteoarthritis, right knee: Secondary | ICD-10-CM | POA: Diagnosis not present

## 2019-06-15 DIAGNOSIS — M25561 Pain in right knee: Secondary | ICD-10-CM | POA: Diagnosis not present

## 2019-06-15 DIAGNOSIS — M150 Primary generalized (osteo)arthritis: Secondary | ICD-10-CM | POA: Diagnosis not present

## 2019-06-15 DIAGNOSIS — Z79899 Other long term (current) drug therapy: Secondary | ICD-10-CM | POA: Diagnosis not present

## 2019-06-15 DIAGNOSIS — Z7952 Long term (current) use of systemic steroids: Secondary | ICD-10-CM | POA: Diagnosis not present

## 2019-06-15 DIAGNOSIS — M810 Age-related osteoporosis without current pathological fracture: Secondary | ICD-10-CM | POA: Diagnosis not present

## 2019-06-19 ENCOUNTER — Ambulatory Visit (INDEPENDENT_AMBULATORY_CARE_PROVIDER_SITE_OTHER): Payer: Medicare Other | Admitting: Podiatry

## 2019-06-19 ENCOUNTER — Other Ambulatory Visit: Payer: Self-pay

## 2019-06-19 ENCOUNTER — Encounter: Payer: Self-pay | Admitting: Podiatry

## 2019-06-19 DIAGNOSIS — E1151 Type 2 diabetes mellitus with diabetic peripheral angiopathy without gangrene: Secondary | ICD-10-CM

## 2019-06-19 DIAGNOSIS — M79675 Pain in left toe(s): Secondary | ICD-10-CM

## 2019-06-19 DIAGNOSIS — B351 Tinea unguium: Secondary | ICD-10-CM

## 2019-06-19 DIAGNOSIS — L84 Corns and callosities: Secondary | ICD-10-CM | POA: Diagnosis not present

## 2019-06-19 DIAGNOSIS — M79674 Pain in right toe(s): Secondary | ICD-10-CM

## 2019-06-19 NOTE — Progress Notes (Addendum)
Subjective: Sheila Morgan presents today for follow up of preventative diabetic foot care and callus(es) b/l feet and painful mycotic toenails b/l that are difficult to trim. Pain interferes with ambulation. Aggravating factors include wearing enclosed shoe gear. Pain is relieved with periodic professional debridement.   She voices no new pedal problems on today's visit.   No Known Allergies   Objective: There were no vitals filed for this visit.  Vascular Examination:  Capillary fill time to digits <3s b/l, palpable DP pulses b/l, non-palpable PT pulses b/l, pedal hair absent b/l and skin temperature gradient within normal limits b/l  Dermatological Examination: Pedal skin with normal turgor, texture and tone bilaterally, no open wounds bilaterally, no interdigital macerations bilaterally, toenails 1-5 b/l elongated, dystrophic, thickened, crumbly with subungual debris and hyperkeratotic lesion(s) submet head 5 b/l.  No erythema, no edema, no drainage, no flocculence  Musculoskeletal: Normal muscle strength 5/5 to all lower extremity muscle groups bilaterally, no gross bony deformities bilaterally and no pain crepitus or joint limitation noted with ROM b/l  Neurological: Protective sensation absent with 10g monofilament b/l and vibratory sensation absent b/l  Assessment: 1. Pain due to onychomycosis of toenails of both feet   2. Callus   3. Type II diabetes mellitus with peripheral circulatory disorder (HCC)    Plan: -Continue diabetic foot care principles. Literature dispensed on today.  -Toenails 1-5 b/l were debrided in length and girth without iatrogenic bleeding. -calluses were debrided without complication or incident. Total number debrided submet head 5 b/l -Patient to continue soft, supportive shoe gear daily. -Patient to report any pedal injuries to medical professional immediately. -Patient/POA to call should there be question/concern in the interim.  Return in about 3  months (around 09/16/2019) for diabetic nail and callus trim.

## 2019-06-19 NOTE — Patient Instructions (Signed)
Diabetes Mellitus and Foot Care Foot care is an important part of your health, especially when you have diabetes. Diabetes may cause you to have problems because of poor blood flow (circulation) to your feet and legs, which can cause your skin to:  Become thinner and drier.  Break more easily.  Heal more slowly.  Peel and crack. You may also have nerve damage (neuropathy) in your legs and feet, causing decreased feeling in them. This means that you may not notice minor injuries to your feet that could lead to more serious problems. Noticing and addressing any potential problems early is the best way to prevent future foot problems. How to care for your feet Foot hygiene  Wash your feet daily with warm water and mild soap. Do not use hot water. Then, pat your feet and the areas between your toes until they are completely dry. Do not soak your feet as this can dry your skin.  Trim your toenails straight across. Do not dig under them or around the cuticle. File the edges of your nails with an emery board or nail file.  Apply a moisturizing lotion or petroleum jelly to the skin on your feet and to dry, brittle toenails. Use lotion that does not contain alcohol and is unscented. Do not apply lotion between your toes. Shoes and socks  Wear clean socks or stockings every day. Make sure they are not too tight. Do not wear knee-high stockings since they may decrease blood flow to your legs.  Wear shoes that fit properly and have enough cushioning. Always look in your shoes before you put them on to be sure there are no objects inside.  To break in new shoes, wear them for just a few hours a day. This prevents injuries on your feet. Wounds, scrapes, corns, and calluses  Check your feet daily for blisters, cuts, bruises, sores, and redness. If you cannot see the bottom of your feet, use a mirror or ask someone for help.  Do not cut corns or calluses or try to remove them with medicine.  If you  find a minor scrape, cut, or break in the skin on your feet, keep it and the skin around it clean and dry. You may clean these areas with mild soap and water. Do not clean the area with peroxide, alcohol, or iodine.  If you have a wound, scrape, corn, or callus on your foot, look at it several times a day to make sure it is healing and not infected. Check for: ? Redness, swelling, or pain. ? Fluid or blood. ? Warmth. ? Pus or a bad smell. General instructions  Do not cross your legs. This may decrease blood flow to your feet.  Do not use heating pads or hot water bottles on your feet. They may burn your skin. If you have lost feeling in your feet or legs, you may not know this is happening until it is too late.  Protect your feet from hot and cold by wearing shoes, such as at the beach or on hot pavement.  Schedule a complete foot exam at least once a year (annually) or more often if you have foot problems. If you have foot problems, report any cuts, sores, or bruises to your health care provider immediately. Contact a health care provider if:  You have a medical condition that increases your risk of infection and you have any cuts, sores, or bruises on your feet.  You have an injury that is not   healing.  You have redness on your legs or feet.  You feel burning or tingling in your legs or feet.  You have pain or cramps in your legs and feet.  Your legs or feet are numb.  Your feet always feel cold.  You have pain around a toenail. Get help right away if:  You have a wound, scrape, corn, or callus on your foot and: ? You have pain, swelling, or redness that gets worse. ? You have fluid or blood coming from the wound, scrape, corn, or callus. ? Your wound, scrape, corn, or callus feels warm to the touch. ? You have pus or a bad smell coming from the wound, scrape, corn, or callus. ? You have a fever. ? You have a red line going up your leg. Summary  Check your feet every day  for cuts, sores, red spots, swelling, and blisters.  Moisturize feet and legs daily.  Wear shoes that fit properly and have enough cushioning.  If you have foot problems, report any cuts, sores, or bruises to your health care provider immediately.  Schedule a complete foot exam at least once a year (annually) or more often if you have foot problems. This information is not intended to replace advice given to you by your health care provider. Make sure you discuss any questions you have with your health care provider. Document Revised: 01/11/2019 Document Reviewed: 05/22/2016 Elsevier Patient Education  2020 Elsevier Inc.  

## 2019-06-25 ENCOUNTER — Ambulatory Visit: Payer: Medicare Other | Attending: Internal Medicine

## 2019-06-25 DIAGNOSIS — Z23 Encounter for immunization: Secondary | ICD-10-CM | POA: Insufficient documentation

## 2019-06-25 NOTE — Progress Notes (Signed)
   Covid-19 Vaccination Clinic  Name:  Sheila Morgan    MRN: [id number] DOB: 30-May-1932  06/25/2019  Sheila Morgan was observed post Covid-19 immunization for 15 minutes without incidence. She was provided with Vaccine Information Sheet and instruction to access the V-Safe system.   Sheila Morgan was instructed to call 911 with any severe reactions post vaccine: Marland Kitchen Difficulty breathing  . Swelling of your face and throat  . A fast heartbeat  . A bad rash all over your body  . Dizziness and weakness    Immunizations Administered    Name Date Dose VIS Date Route   Pfizer COVID-19 Vaccine 06/25/2019  2:58 PM 0.3 mL 04/14/2019 Intramuscular   Manufacturer: [place]   Lot: [id number]   [place]: [id number]

## 2019-07-01 DIAGNOSIS — Z20828 Contact with and (suspected) exposure to other viral communicable diseases: Secondary | ICD-10-CM | POA: Diagnosis not present

## 2019-07-04 ENCOUNTER — Encounter: Payer: Self-pay | Admitting: Internal Medicine

## 2019-07-04 ENCOUNTER — Telehealth: Payer: Self-pay | Admitting: Nurse Practitioner

## 2019-07-04 ENCOUNTER — Other Ambulatory Visit: Payer: Self-pay | Admitting: Physician Assistant

## 2019-07-04 ENCOUNTER — Telehealth: Payer: Self-pay | Admitting: Internal Medicine

## 2019-07-04 DIAGNOSIS — I2510 Atherosclerotic heart disease of native coronary artery without angina pectoris: Secondary | ICD-10-CM

## 2019-07-04 DIAGNOSIS — I10 Essential (primary) hypertension: Secondary | ICD-10-CM

## 2019-07-04 DIAGNOSIS — U071 COVID-19: Secondary | ICD-10-CM

## 2019-07-04 DIAGNOSIS — E119 Type 2 diabetes mellitus without complications: Secondary | ICD-10-CM

## 2019-07-04 NOTE — Progress Notes (Signed)
  I connected by phone with Sheila Morgan on 07/04/2019 at 4:45 PM to discuss the potential use of an new treatment for mild to moderate COVID-19 viral infection in non-hospitalized patients.  This patient is a 86 y.o. female that meets the FDA criteria for Emergency Use Authorization of bamlanivimab or casirivimab\imdevimab.  Has a (+) direct SARS-CoV-2 viral test result  Has mild or moderate COVID-19   Is ? 86 years of age and weighs ? 40 kg  Is NOT hospitalized due to COVID-19  Is NOT requiring oxygen therapy or requiring an increase in baseline oxygen flow rate due to COVID-19  Is within 10 days of symptom onset  Has at least one of the high risk factor(s) for progression to severe COVID-19 and/or hospitalization as defined in EUA.  Specific high risk criteria : Diabetes, CAD, HTN   I have spoken and communicated the following to the patient or parent/caregiver:  1. FDA has authorized the emergency use of bamlanivimab and casirivimab\imdevimab for the treatment of mild to moderate COVID-19 in adults and pediatric patients with positive results of direct SARS-CoV-2 viral testing who are 71 years of age and older weighing at least 40 kg, and who are at high risk for progressing to severe COVID-19 and/or hospitalization.  2. The significant known and potential risks and benefits of bamlanivimab and casirivimab\imdevimab, and the extent to which such potential risks and benefits are unknown.  3. Information on available alternative treatments and the risks and benefits of those alternatives, including clinical trials.  4. Patients treated with bamlanivimab and casirivimab\imdevimab should continue to self-isolate and use infection control measures (e.g., wear mask, isolate, social distance, avoid sharing personal items, clean and disinfect "high touch" surfaces, and frequent handwashing) according to CDC guidelines.   5. The patient or parent/caregiver has the option to accept or  refuse bamlanivimab or casirivimab\imdevimab .  After reviewing this information with the patient, The patient agreed to proceed with receiving the bamlanimivab infusion and will be provided a copy of the Fact sheet prior to receiving the infusion.   Sheila Morgan 07/04/2019 4:45 PM

## 2019-07-04 NOTE — Telephone Encounter (Signed)
She was diagnosed withCovid.  Her symptoms started 2/24.  Symptoms Include weakness, confusion, cough, congestion.  Can you please call [phone] to refer her for possible infusion if she meets the criteria.

## 2019-07-04 NOTE — Telephone Encounter (Signed)
Called to Discuss with patient about Covid symptoms and the use of bamlanivimab, a monoclonal antibody infusion for those with mild to moderate Covid symptoms and at a high risk of hospitalization.     Pt is qualified for this infusion at the [facility] infusion center due to co-morbid conditions and/or a member of an at-risk group.     Patient Active Problem List   Diagnosis Date Noted  . Urinary frequency 02/22/2019  . Confusion 02/22/2019  . Fall 07/14/2018  . Hearing loss 01/05/2018  . Memory difficulties 01/05/2018  . Multiple closed fractures of ribs of right side 01/19/2017  . Acute pain of right shoulder 01/05/2017  . Rib pain on right side 01/05/2017  . Acute pain of right knee 01/05/2017  . B12 deficiency 06/13/2015  . Hypothyroidism 06/13/2015  . DM (diabetes mellitus) type II, controlled, with peripheral vascular disorder ([place]) 03/06/2014  . Gout 10/15/2013  . Arthritis   . Hiatal hernia   . Coronary artery disease   . Left knee DJD   . Benign paroxysmal positional vertigo 05/19/2013  . Weakness generalized 05/09/2013  . Pseudogout of left knee 03/29/2013  . Acute right lumbar radiculopathy 03/18/2013  . Shoulder impingement syndrome 01/25/2013  . Vertigo 10/17/2012  . Renal insufficiency, mild 06/20/2011  . Essential hypertension 12/31/2009  . Osteoporosis 12/31/2009  . SKIN CANCER, HX OF 12/31/2009  . Proteinuria 01/05/2008  . Anemia 12/21/2007  . Hyperlipidemia 09/08/2006  . Polymyalgia rheumatica ([place]) 09/08/2006  . CORONARY ARTERY BYPASS GRAFT, HX OF 09/08/2006    Patient declines infusion at this time. Symptoms tier reviewed as well as criteria for ending isolation. Preventative practices reviewed. Patient verbalized understanding.    Patient advised to call back if she decides that she does want to get infusion. Callback number to the infusion center given. Patient advised to go to Urgent care or ED with severe symptoms.   Symptoms started on  06/28/19

## 2019-07-04 NOTE — Telephone Encounter (Signed)
LVM with pts information per the automated system to see if she would qualify. Gave direct number to call back.

## 2019-07-05 ENCOUNTER — Ambulatory Visit (HOSPITAL_COMMUNITY)
Admission: RE | Admit: 2019-07-05 | Discharge: 2019-07-05 | Disposition: A | Discharge status: Home / Self Care | Payer: Medicare Other | Source: Ambulatory Visit | Attending: Pulmonary Disease | Admitting: Pulmonary Disease

## 2019-07-05 ENCOUNTER — Encounter (HOSPITAL_COMMUNITY): Payer: Self-pay

## 2019-07-05 DIAGNOSIS — Z23 Encounter for immunization: Secondary | ICD-10-CM | POA: Diagnosis not present

## 2019-07-05 DIAGNOSIS — U071 COVID-19: Secondary | ICD-10-CM

## 2019-07-05 MED ORDER — METHYLPREDNISOLONE SODIUM SUCC 125 MG IJ SOLR: 125.0000 mg | Freq: Once | INTRAMUSCULAR | Status: DC | PRN

## 2019-07-05 MED ORDER — EPINEPHRINE 0.3 MG/0.3ML IJ SOAJ: 0.3000 mg | Freq: Once | INTRAMUSCULAR | Status: DC | PRN

## 2019-07-05 MED ORDER — SODIUM CHLORIDE 0.9 % IV SOLN
700.0000 mg | Freq: Once | INTRAVENOUS | Status: AC
  Administered 2019-07-05: 700 mg via INTRAVENOUS
  Filled 2019-07-05: qty 20

## 2019-07-05 MED ORDER — SODIUM CHLORIDE 0.9 % IV SOLN
INTRAVENOUS | Status: DC | PRN
  Administered 2019-07-05: 250 mL via INTRAVENOUS

## 2019-07-05 MED ORDER — FAMOTIDINE IN NACL 20-0.9 MG/50ML-% IV SOLN: 20.0000 mg | Freq: Once | INTRAVENOUS | Status: DC | PRN

## 2019-07-05 MED ORDER — ALBUTEROL SULFATE HFA 108 (90 BASE) MCG/ACT IN AERS: 2.0000 | INHALATION_SPRAY | Freq: Once | RESPIRATORY_TRACT | Status: DC | PRN

## 2019-07-05 MED ORDER — DIPHENHYDRAMINE HCL 50 MG/ML IJ SOLN: 50.0000 mg | Freq: Once | INTRAMUSCULAR | Status: DC | PRN

## 2019-07-05 NOTE — Discharge Instructions (Signed)

## 2019-07-05 NOTE — Progress Notes (Signed)
  Diagnosis: COVID-19  Physician: Asencion Noble  Procedure: [facility] Infusion [facility] Med: bamlanivimab infusion - Provided patient with bamlanimivab fact sheet for patients, parents and caregivers prior to infusion.  Complications: No immediate complications noted.  Discharge: Discharged home   [place], [place] C 07/05/2019

## 2019-07-14 ENCOUNTER — Other Ambulatory Visit: Payer: Self-pay | Admitting: Internal Medicine

## 2019-07-19 ENCOUNTER — Ambulatory Visit: Payer: Medicare Other

## 2019-07-21 ENCOUNTER — Other Ambulatory Visit: Payer: Self-pay

## 2019-07-21 MED ORDER — AMLODIPINE BESYLATE 5 MG PO TABS: 7.5000 mg | ORAL_TABLET | Freq: Every day | ORAL | 1 refills | Status: DC

## 2019-08-10 NOTE — Patient Instructions (Signed)
  Blood work was ordered.     Medications reviewed and updated.  Changes include :     Your prescription(s) have been submitted to your pharmacy. Please take as directed and contact our office if you believe you are having problem(s) with the medication(s).  A referral was ordered for        Someone will call you to schedule this.    Please followup in 6 months   

## 2019-08-10 NOTE — Progress Notes (Signed)
Subjective:    Patient ID: Sheila Morgan, female    DOB: May 08, 1932, 86 y.o.   MRN: [id number]  HPI The patient is here for follow up of their chronic medical problems, including diabetes, CAD, hypertension, hypothyroidism, hyperlipidemia, memory d/o, gout, anemia.  He daughter is here with her today.  She is taking all of her medications as prescribed.        Medications and allergies reviewed with patient and updated if appropriate.  Patient Active Problem List   Diagnosis Date Noted  . Urinary frequency 02/22/2019  . Confusion 02/22/2019  . Fall 07/14/2018  . Hearing loss 01/05/2018  . Memory difficulties 01/05/2018  . Multiple closed fractures of ribs of right side 01/19/2017  . Acute pain of right shoulder 01/05/2017  . Rib pain on right side 01/05/2017  . Acute pain of right knee 01/05/2017  . B12 deficiency 06/13/2015  . Hypothyroidism 06/13/2015  . DM (diabetes mellitus) type II, controlled, with peripheral vascular disorder ([place]) 03/06/2014  . Gout 10/15/2013  . Arthritis   . Hiatal hernia   . Coronary artery disease   . Left knee DJD   . Benign paroxysmal positional vertigo 05/19/2013  . Weakness generalized 05/09/2013  . Pseudogout of left knee 03/29/2013  . Acute right lumbar radiculopathy 03/18/2013  . Shoulder impingement syndrome 01/25/2013  . Vertigo 10/17/2012  . Renal insufficiency, mild 06/20/2011  . Essential hypertension 12/31/2009  . Osteoporosis 12/31/2009  . SKIN CANCER, HX OF 12/31/2009  . Proteinuria 01/05/2008  . Anemia 12/21/2007  . Hyperlipidemia 09/08/2006  . Polymyalgia rheumatica ([place]) 09/08/2006  . CORONARY ARTERY BYPASS GRAFT, HX OF 09/08/2006    Current Outpatient Medications on File Prior to Visit  Medication Sig Dispense Refill  . acetaminophen (TYLENOL) 325 MG tablet Take 2 tablets (650 mg total) by mouth every 6 (six) hours as needed for mild pain, fever or headache.    . alendronate (FOSAMAX) 70 MG tablet Take 70 mg  by mouth every Saturday. Take with a full glass of water on an empty stomach.     Marland Kitchen amLODipine (NORVASC) 5 MG tablet Take 1.5 tablets (7.5 mg total) by mouth daily. 135 tablet 1  . aspirin 81 MG tablet Take 81 mg by mouth daily.    . Blood Glucose Monitoring Suppl (ONE TOUCH ULTRA MINI) W/DEVICE KIT Use to test blood sugar twice daily ICD 10 E11 21 1 each 0  . Calcium Carbonate-Vitamin D (CALCIUM 600-D PO) Take 1 tablet by mouth 2 (two) times daily.     . carvedilol (COREG) 25 MG tablet TAKE 1 TABLET(25 MG) BY MOUTH TWICE DAILY WITH A MEAL 180 tablet 1  . colchicine 0.6 MG tablet TAKE 1 TABLET(0.6 MG) BY MOUTH EVERY OTHER DAY 45 tablet 0  . diclofenac Sodium (VOLTAREN) 1 % GEL Apply topically.    . furosemide (LASIX) 20 MG tablet TAKE 1 TABLET(20 MG) BY MOUTH DAILY 90 tablet 0  . isosorbide mononitrate (IMDUR) 30 MG 24 hr tablet TAKE 1 TABLET(30 MG) BY MOUTH DAILY 90 tablet 1  . levothyroxine (SYNTHROID) 50 MCG tablet Take 1 tablet (50 mcg total) by mouth daily. 90 tablet 1  . losartan (COZAAR) 100 MG tablet TAKE 1 TABLET(100 MG) BY MOUTH DAILY 90 tablet 1  . metFORMIN (GLUCOPHAGE) 500 MG tablet TAKE 1/2 TABLET(250 MG) BY MOUTH DAILY WITH LARGEST MEAL 45 tablet 2  . OneTouch Delica Lancets 62G MISC TEST BLOOD SUGAR TWICE DAILY 200 each 1  . ONETOUCH ULTRA test  strip USE TO TEST BLOOD SUGAR TWICE DAILY 200 strip 3  . predniSONE (DELTASONE) 5 MG tablet Take 5 mg by mouth daily.    . simvastatin (ZOCOR) 40 MG tablet TAKE 1 TABLET(40 MG) BY MOUTH AT BEDTIME 90 tablet 1  . vitamin B-12 (CYANOCOBALAMIN) 1000 MCG tablet Take 1,000 mcg by mouth daily.       No current facility-administered medications on file prior to visit.    Past Medical History:  Diagnosis Date  . Arthritis   . Cancer ([place])    skin on nose .  mylenoma  . Coronary artery disease    status post post LAD angioplasty in 1993  and subsequent coronary artery bypass grafting x1 with a LIMA to the LAD July of 1994. She had stenting  of her RCA with a bare-metal stent November 2002.  . Diabetes mellitus    2  . Gout   . Heart murmur   . Hematoma    Right groin  . Hemorrhoids   . Hiatal hernia   . History of blood transfusion   . HOH (hard of hearing)   . Hyperlipidemia   . Hypertension   . Left knee DJD   . Lightheadedness    has seen Dr Gwenlyn Found and ENT.. cant find out why  . Myocardial infarction ([place])   . Osteoporosis   . PONV (postoperative nausea and vomiting)   . Shingles 10/27/2013    Past Surgical History:  Procedure Laterality Date  . CORONARY ANGIOPLASTY  1993   LAD  . CORONARY ANGIOPLASTY WITH STENT PLACEMENT  03/09/01   RCA  . CORONARY ARTERY BYPASS GRAFT  1994  . JOINT REPLACEMENT Right 1998   hip  . NM MYOVIEW LTD  03/2012   Normal  . SKIN GRAFT     to nose skin cancer  . TOTAL HIP ARTHROPLASTY    . TOTAL KNEE ARTHROPLASTY Left 10/09/2013   Procedure: TOTAL KNEE ARTHROPLASTY;  Surgeon: Lorn Junes, MD;  Location: [facility];  Service: Orthopedics;  Laterality: Left;    Social History   Socioeconomic History  . Marital status: Widowed    Spouse name: Not on file  . Number of children: 2  . Years of education: Not on file  . Highest education level: 12th grade  Occupational History  . Occupation: Retired   Tobacco Use  . Smoking status: Never Smoker  . Smokeless tobacco: Never Used  Substance and Sexual Activity  . Alcohol use: No  . Drug use: No  . Sexual activity: Never  Other Topics Concern  . Not on file  Social History Narrative   Left handed    Caffeine 4 cups daily    Lives at home alone    Social Determinants of Health   Financial Resource Strain:   . Difficulty of Paying Living Expenses:   Food Insecurity:   . Worried About Charity fundraiser in the Last Year:   . Arboriculturist in the Last Year:   Transportation Needs:   . Film/video editor (Medical):   Marland Kitchen Lack of Transportation (Non-Medical):   Physical Activity:   . Days of Exercise per Week:   .  Minutes of Exercise per Session:   Stress:   . Feeling of Stress :   Social Connections:   . Frequency of Communication with Friends and Family:   . Frequency of Social Gatherings with Friends and Family:   . Attends Religious Services:   . Active Member  of Clubs or Organizations:   . Attends Archivist Meetings:   Marland Kitchen Marital Status:     Family History  Problem Relation Age of Onset  . Heart disease Father   . Cancer Sister        breast  . Diabetes Sister   . Heart disease Brother   . Heart disease Brother   . Heart disease Brother   . Diabetes Sister   . Stroke Mother     Review of Systems     Objective:  There were no vitals filed for this visit. BP Readings from Last 3 Encounters:  07/05/19 124/63  06/13/19 (!) 179/79  02/22/19 (!) 158/74   Wt Readings from Last 3 Encounters:  06/13/19 161 lb 9.6 oz (73.3 kg)  02/22/19 160 lb (72.6 kg)  01/11/19 158 lb (71.7 kg)   There is no height or weight on file to calculate BMI.   Physical Exam    Constitutional: Appears well-developed and well-nourished. No distress.  HENT:  Head: Normocephalic and atraumatic.  Neck: Neck supple. No tracheal deviation present. No thyromegaly present.  No cervical lymphadenopathy Cardiovascular: Normal rate, regular rhythm and normal heart sounds.   No murmur heard. No carotid bruit .  No edema Pulmonary/Chest: Effort normal and breath sounds normal. No respiratory distress. No has no wheezes. No rales.  Skin: Skin is warm and dry. Not diaphoretic.  Psychiatric: Normal mood and affect. Behavior is normal.      Assessment & Plan:    See Problem List for Assessment and Plan of chronic medical problems.    This visit occurred during the SARS-CoV-2 public health emergency.  Safety protocols were in place, including screening questions prior to the visit, additional usage of staff PPE, and extensive cleaning of exam room while observing appropriate contact time as indicated  for disinfecting solutions.    This encounter was created in error - please disregard.

## 2019-08-11 ENCOUNTER — Encounter: Payer: Medicare Other | Admitting: Internal Medicine

## 2019-08-11 ENCOUNTER — Encounter: Payer: Self-pay | Admitting: Internal Medicine

## 2019-08-14 ENCOUNTER — Other Ambulatory Visit: Payer: Self-pay | Admitting: Internal Medicine

## 2019-08-16 ENCOUNTER — Other Ambulatory Visit: Payer: Self-pay

## 2019-08-16 MED ORDER — ISOSORBIDE MONONITRATE ER 30 MG PO TB24: ORAL_TABLET | ORAL | 1 refills | Status: DC

## 2019-08-20 ENCOUNTER — Encounter: Payer: Self-pay | Admitting: Internal Medicine

## 2019-08-27 NOTE — Progress Notes (Signed)
Subjective:    Patient ID: Sheila Morgan, female    DOB: [birth year], 86 y.o.   MRN: [id number]  HPI The patient is here for follow up of their chronic medical problems, including diabetes, CAD, hypertension, hypothyroidism, hyperlipidemia, memory d/o, gout, anemia, renal insufficiency.  She is here with her daughter.  Neither have any concerns.  She is taking all of her medications as prescribed.    She did have Covid since she was here last.  She had the monoclonal antibodies and has no residual symptoms.  She will be eligible for the second Covid vaccine in June.      Medications and allergies reviewed with patient and updated if appropriate.  Patient Active Problem List   Diagnosis Date Noted  . Urinary frequency 02/22/2019  . Confusion 02/22/2019  . Fall 07/14/2018  . Hearing loss 01/05/2018  . Memory difficulties 01/05/2018  . Multiple closed fractures of ribs of right side 01/19/2017  . Acute pain of right shoulder 01/05/2017  . Rib pain on right side 01/05/2017  . Acute pain of right knee 01/05/2017  . B12 deficiency 06/13/2015  . Hypothyroidism 06/13/2015  . DM (diabetes mellitus) type II, controlled, with peripheral vascular disorder ([place]) 03/06/2014  . Gout 10/15/2013  . Arthritis   . Hiatal hernia   . Coronary artery disease   . Left knee DJD   . Benign paroxysmal positional vertigo 05/19/2013  . Weakness generalized 05/09/2013  . Pseudogout of left knee 03/29/2013  . Acute right lumbar radiculopathy 03/18/2013  . Shoulder impingement syndrome 01/25/2013  . Vertigo 10/17/2012  . Renal insufficiency, mild 06/20/2011  . Essential hypertension 12/31/2009  . Osteoporosis 12/31/2009  . SKIN CANCER, HX OF 12/31/2009  . Proteinuria 01/05/2008  . Anemia 12/21/2007  . Hyperlipidemia 09/08/2006  . Polymyalgia rheumatica ([place]) 09/08/2006  . CORONARY ARTERY BYPASS GRAFT, HX OF 09/08/2006    Current Outpatient Medications on File Prior to Visit   Medication Sig Dispense Refill  . acetaminophen (TYLENOL) 325 MG tablet Take 2 tablets (650 mg total) by mouth every 6 (six) hours as needed for mild pain, fever or headache.    . alendronate (FOSAMAX) 70 MG tablet Take 70 mg by mouth every Saturday. Take with a full glass of water on an empty stomach.     Marland Kitchen amLODipine (NORVASC) 5 MG tablet Take 1.5 tablets (7.5 mg total) by mouth daily. 135 tablet 1  . aspirin 81 MG tablet Take 81 mg by mouth daily.    . Calcium Carbonate-Vitamin D (CALCIUM 600-D PO) Take 1 tablet by mouth 2 (two) times daily.     . carvedilol (COREG) 25 MG tablet TAKE 1 TABLET(25 MG) BY MOUTH TWICE DAILY WITH A MEAL 180 tablet 1  . colchicine 0.6 MG tablet TAKE 1 TABLET(0.6 MG) BY MOUTH EVERY OTHER DAY 45 tablet 0  . diclofenac Sodium (VOLTAREN) 1 % GEL Apply topically.    . furosemide (LASIX) 20 MG tablet TAKE 1 TABLET(20 MG) BY MOUTH DAILY 90 tablet 0  . isosorbide mononitrate (IMDUR) 30 MG 24 hr tablet TAKE 1 TABLET(30 MG) BY MOUTH DAILY 90 tablet 1  . levothyroxine (SYNTHROID) 50 MCG tablet TAKE 1 TABLET(50 MCG) BY MOUTH DAILY 90 tablet 0  . losartan (COZAAR) 100 MG tablet TAKE 1 TABLET(100 MG) BY MOUTH DAILY 90 tablet 1  . metFORMIN (GLUCOPHAGE) 500 MG tablet TAKE 1/2 TABLET(250 MG) BY MOUTH DAILY WITH LARGEST MEAL 45 tablet 2  . OneTouch Delica Lancets 99991111 MISC TEST BLOOD  SUGAR TWICE DAILY 200 each 1  . ONETOUCH ULTRA test strip USE TO TEST BLOOD SUGAR TWICE DAILY 200 strip 3  . predniSONE (DELTASONE) 5 MG tablet Take 5 mg by mouth daily.    . simvastatin (ZOCOR) 40 MG tablet TAKE 1 TABLET(40 MG) BY MOUTH AT BEDTIME 90 tablet 1  . vitamin B-12 (CYANOCOBALAMIN) 1000 MCG tablet Take 1,000 mcg by mouth daily.       No current facility-administered medications on file prior to visit.    Past Medical History:  Diagnosis Date  . Arthritis   . Cancer ([place])    skin on nose .  mylenoma  . Coronary artery disease    status post post LAD angioplasty in 1993  and subsequent  coronary artery bypass grafting x1 with a LIMA to the LAD July of 1994. She had stenting of her RCA with a bare-metal stent November 2002.  . Diabetes mellitus    2  . Gout   . Heart murmur   . Hematoma    Right groin  . Hemorrhoids   . Hiatal hernia   . History of blood transfusion   . HOH (hard of hearing)   . Hyperlipidemia   . Hypertension   . Left knee DJD   . Lightheadedness    has seen Dr Gwenlyn Found and ENT.. cant find out why  . Myocardial infarction ([place])   . Osteoporosis   . PONV (postoperative nausea and vomiting)   . Shingles 10/27/2013    Past Surgical History:  Procedure Laterality Date  . CORONARY ANGIOPLASTY  1993   LAD  . CORONARY ANGIOPLASTY WITH STENT PLACEMENT  03/09/01   RCA  . CORONARY ARTERY BYPASS GRAFT  1994  . JOINT REPLACEMENT Right 1998   hip  . NM MYOVIEW LTD  03/2012   Normal  . SKIN GRAFT     to nose skin cancer  . TOTAL HIP ARTHROPLASTY    . TOTAL KNEE ARTHROPLASTY Left 10/09/2013   Procedure: TOTAL KNEE ARTHROPLASTY;  Surgeon: Lorn Junes, MD;  Location: [facility];  Service: Orthopedics;  Laterality: Left;    Social History   Socioeconomic History  . Marital status: Widowed    Spouse name: Not on file  . Number of children: 2  . Years of education: Not on file  . Highest education level: 12th grade  Occupational History  . Occupation: Retired   Tobacco Use  . Smoking status: Never Smoker  . Smokeless tobacco: Never Used  Substance and Sexual Activity  . Alcohol use: No  . Drug use: No  . Sexual activity: Never  Other Topics Concern  . Not on file  Social History Narrative   Left handed    Caffeine 4 cups daily    Lives at home alone    Social Determinants of Health   Financial Resource Strain:   . Difficulty of Paying Living Expenses:   Food Insecurity:   . Worried About Charity fundraiser in the Last Year:   . Arboriculturist in the Last Year:   Transportation Needs:   . Film/video editor (Medical):   Marland Kitchen Lack of  Transportation (Non-Medical):   Physical Activity:   . Days of Exercise per Week:   . Minutes of Exercise per Session:   Stress:   . Feeling of Stress :   Social Connections:   . Frequency of Communication with Friends and Family:   . Frequency of Social Gatherings with Friends and Family:   .  Attends Religious Services:   . Active Member of Clubs or Organizations:   . Attends Archivist Meetings:   Marland Kitchen Marital Status:     Family History  Problem Relation Age of Onset  . Heart disease Father   . Cancer Sister        breast  . Diabetes Sister   . Heart disease Brother   . Heart disease Brother   . Heart disease Brother   . Diabetes Sister   . Stroke Mother     Review of Systems  Constitutional: Negative for chills and fever.  Respiratory: Positive for cough (occ). Negative for shortness of breath and wheezing.   Cardiovascular: Negative for chest pain, palpitations and leg swelling.  Gastrointestinal: Negative for abdominal pain.  Neurological: Positive for dizziness and light-headedness. Negative for headaches.  Psychiatric/Behavioral: Negative for sleep disturbance.       Objective:   Vitals:   08/28/19 0901  BP: (!) 170/82  Pulse: 72  Temp: 98 F (36.7 C)  SpO2: 96%   BP Readings from Last 3 Encounters:  08/28/19 (!) 170/82  07/05/19 124/63  06/13/19 (!) 179/79   Wt Readings from Last 3 Encounters:  08/28/19 161 lb (73 kg)  06/13/19 161 lb 9.6 oz (73.3 kg)  02/22/19 160 lb (72.6 kg)   Body mass index is 25.22 kg/m.   Physical Exam    Constitutional: Appears well-developed and well-nourished. No distress.  HENT:  Head: Normocephalic and atraumatic.  Neck: Neck supple. No tracheal deviation present. No thyromegaly present.  No cervical lymphadenopathy Cardiovascular: Normal rate, regular rhythm and normal heart sounds.   No murmur heard. No carotid bruit .  No edema Pulmonary/Chest: Effort normal and breath sounds normal. No respiratory  distress. No has no wheezes. No rales.  Skin: Skin is warm and dry. Not diaphoretic.  Psychiatric: Normal mood and affect. Behavior is normal.      Assessment & Plan:    See Problem List for Assessment and Plan of chronic medical problems.    This visit occurred during the SARS-CoV-2 public health emergency.  Safety protocols were in place, including screening questions prior to the visit, additional usage of staff PPE, and extensive cleaning of exam room while observing appropriate contact time as indicated for disinfecting solutions.

## 2019-08-27 NOTE — Patient Instructions (Addendum)
  Blood work was ordered.      Please followup in 6 months

## 2019-08-28 ENCOUNTER — Encounter: Payer: Self-pay | Admitting: Internal Medicine

## 2019-08-28 ENCOUNTER — Ambulatory Visit (INDEPENDENT_AMBULATORY_CARE_PROVIDER_SITE_OTHER): Payer: Medicare Other | Admitting: Internal Medicine

## 2019-08-28 VITALS — BP 170/82 | HR 72 | Temp 98.0°F | Ht 67.0 in | Wt 161.0 lb

## 2019-08-28 DIAGNOSIS — I2583 Coronary atherosclerosis due to lipid rich plaque: Secondary | ICD-10-CM | POA: Diagnosis not present

## 2019-08-28 DIAGNOSIS — M1A9XX0 Chronic gout, unspecified, without tophus (tophi): Secondary | ICD-10-CM

## 2019-08-28 DIAGNOSIS — R413 Other amnesia: Secondary | ICD-10-CM

## 2019-08-28 DIAGNOSIS — M810 Age-related osteoporosis without current pathological fracture: Secondary | ICD-10-CM

## 2019-08-28 DIAGNOSIS — N289 Disorder of kidney and ureter, unspecified: Secondary | ICD-10-CM | POA: Diagnosis not present

## 2019-08-28 DIAGNOSIS — D649 Anemia, unspecified: Secondary | ICD-10-CM | POA: Diagnosis not present

## 2019-08-28 DIAGNOSIS — I10 Essential (primary) hypertension: Secondary | ICD-10-CM | POA: Diagnosis not present

## 2019-08-28 DIAGNOSIS — E039 Hypothyroidism, unspecified: Secondary | ICD-10-CM

## 2019-08-28 DIAGNOSIS — E1151 Type 2 diabetes mellitus with diabetic peripheral angiopathy without gangrene: Secondary | ICD-10-CM | POA: Diagnosis not present

## 2019-08-28 DIAGNOSIS — I2510 Atherosclerotic heart disease of native coronary artery without angina pectoris: Secondary | ICD-10-CM

## 2019-08-28 DIAGNOSIS — E782 Mixed hyperlipidemia: Secondary | ICD-10-CM

## 2019-08-28 LAB — LIPID PANEL
Cholesterol: 154 mg/dL (ref 0–200)
HDL: 60.3 mg/dL (ref >39.00)
LDL Cholesterol: 71 mg/dL (ref 0–99)
NonHDL: 93.53
Total CHOL/HDL Ratio: 3
Triglycerides: 113 mg/dL (ref 0.0–149.0)
VLDL: 22.6 mg/dL (ref 0.0–40.0)

## 2019-08-28 LAB — COMPREHENSIVE METABOLIC PANEL
ALT: 16 U/L (ref 0–35)
AST: 18 U/L (ref 0–37)
Albumin: 4.2 g/dL (ref 3.5–5.2)
Alkaline Phosphatase: 64 U/L (ref 39–117)
BUN: 17 mg/dL (ref 6–23)
CO2: 33 mEq/L — ABNORMAL HIGH (ref 19–32)
Calcium: 10.7 mg/dL — ABNORMAL HIGH (ref 8.4–10.5)
Chloride: 103 mEq/L (ref 96–112)
Creatinine, Ser: 1.19 mg/dL (ref 0.40–1.20)
GFR: 52.17 mL/min — ABNORMAL LOW (ref >60.00)
Glucose, Bld: 147 mg/dL — ABNORMAL HIGH (ref 70–99)
Potassium: 4.1 mEq/L (ref 3.5–5.1)
Sodium: 143 mEq/L (ref 135–145)
Total Bilirubin: 0.6 mg/dL (ref 0.2–1.2)
Total Protein: 7.1 g/dL (ref 6.0–8.3)

## 2019-08-28 LAB — CBC WITH DIFFERENTIAL/PLATELET
Basophils Absolute: 0.1 10*3/uL (ref 0.0–0.1)
Basophils Relative: 0.8 % (ref 0.0–3.0)
Eosinophils Absolute: 0.1 10*3/uL (ref 0.0–0.7)
Eosinophils Relative: 0.8 % (ref 0.0–5.0)
HCT: 40 % (ref 36.0–46.0)
Hemoglobin: 13.2 g/dL (ref 12.0–15.0)
Lymphocytes Relative: 12.4 % (ref 12.0–46.0)
Lymphs Abs: 1.3 10*3/uL (ref 0.7–4.0)
MCHC: 33 g/dL (ref 30.0–36.0)
MCV: 94.7 fl (ref 78.0–100.0)
Monocytes Absolute: 0.8 10*3/uL (ref 0.1–1.0)
Monocytes Relative: 7.6 % (ref 3.0–12.0)
Neutro Abs: 8.2 10*3/uL — ABNORMAL HIGH (ref 1.4–7.7)
Neutrophils Relative %: 78.4 % — ABNORMAL HIGH (ref 43.0–77.0)
Platelets: 177 10*3/uL (ref 150.0–400.0)
RBC: 4.23 Mil/uL (ref 3.87–5.11)
RDW: 15.7 % — ABNORMAL HIGH (ref 11.5–15.5)
WBC: 10.5 10*3/uL (ref 4.0–10.5)

## 2019-08-28 LAB — VITAMIN D 25 HYDROXY (VIT D DEFICIENCY, FRACTURES): VITD: 70 ng/mL (ref 30.00–100.00)

## 2019-08-28 LAB — TSH: TSH: 1.05 u[IU]/mL (ref 0.35–4.50)

## 2019-08-28 LAB — HEMOGLOBIN A1C: Hgb A1c MFr Bld: 6.6 % — ABNORMAL HIGH (ref 4.6–6.5)

## 2019-08-28 NOTE — Assessment & Plan Note (Signed)
Chronic  Clinically euthyroid Check tsh  Titrate med dose if needed  

## 2019-08-28 NOTE — Assessment & Plan Note (Signed)
Chronic Taking Fosamax weekly Taking calcium and vitamin D We will check vitamin D level Not able to exercise

## 2019-08-28 NOTE — Assessment & Plan Note (Signed)
Chronic She does not drink much fluids typically drinks tea or coffee CMP

## 2019-08-28 NOTE — Assessment & Plan Note (Signed)
Chronic No gout symptoms since her last visit Taking colchicine every other day Continue

## 2019-08-28 NOTE — Assessment & Plan Note (Signed)
Chronic Check lipid panel  Continue daily statin 

## 2019-08-28 NOTE — Assessment & Plan Note (Signed)
Chronic Mild CBC, iron panel

## 2019-08-28 NOTE — Assessment & Plan Note (Signed)
Chronic Her daughter states no decrease in memory-possibly some improvement Still getting Meals on Wheels Son and daughter very involved

## 2019-08-28 NOTE — Assessment & Plan Note (Signed)
Chronic Following with cardiology No concerning symptoms of angina Continue current medications 

## 2019-08-28 NOTE — Assessment & Plan Note (Signed)
Blood pressure elevated here today, but is variable We will continue current medications Unfortunately she is not able to check her blood pressure by herself at home, but that would be ideal CMP

## 2019-08-28 NOTE — Assessment & Plan Note (Signed)
Chronic Check A1c Continue Metformin

## 2019-08-29 LAB — IRON,TIBC AND FERRITIN PANEL
%SAT: 28 % (calc) (ref 16–45)
Ferritin: 45 ng/mL (ref 16–288)
Iron: 100 ug/dL (ref 45–160)
TIBC: 361 mcg/dL (calc) (ref 250–450)

## 2019-08-31 ENCOUNTER — Encounter: Payer: Self-pay | Admitting: Internal Medicine

## 2019-09-06 ENCOUNTER — Telehealth: Payer: Self-pay

## 2019-09-06 MED ORDER — LOSARTAN POTASSIUM 100 MG PO TABS: ORAL_TABLET | ORAL | 1 refills | Status: DC

## 2019-09-06 MED ORDER — LEVOTHYROXINE SODIUM 50 MCG PO TABS: ORAL_TABLET | ORAL | 1 refills | Status: DC

## 2019-09-06 NOTE — Telephone Encounter (Signed)
1.Medication Requested:  losartan (COZAAR) 100 MG tablet  levothyroxine (SYNTHROID) 50 MCG tablet  2. Pharmacy (Name, Street, City):[facility] [place], [address]  3. On Med List: Yes   4. Last Visit with PCP: 4.26.21   5. Next visit date with PCP: 10.26.21    Agent: Please be advised that RX refills may take up to 3 business days. We ask that you follow-up with your pharmacy.

## 2019-09-06 NOTE — Telephone Encounter (Signed)
Rx sent 

## 2019-09-18 ENCOUNTER — Other Ambulatory Visit: Payer: Self-pay

## 2019-09-18 MED ORDER — SIMVASTATIN 40 MG PO TABS: ORAL_TABLET | ORAL | 1 refills | Status: DC

## 2019-09-19 ENCOUNTER — Other Ambulatory Visit: Payer: Self-pay

## 2019-09-19 ENCOUNTER — Ambulatory Visit (INDEPENDENT_AMBULATORY_CARE_PROVIDER_SITE_OTHER): Payer: Medicare Other | Admitting: Podiatry

## 2019-09-19 ENCOUNTER — Encounter: Payer: Self-pay | Admitting: Podiatry

## 2019-09-19 VITALS — Temp 97.9°F

## 2019-09-19 DIAGNOSIS — I739 Peripheral vascular disease, unspecified: Secondary | ICD-10-CM

## 2019-09-19 DIAGNOSIS — L84 Corns and callosities: Secondary | ICD-10-CM | POA: Diagnosis not present

## 2019-09-19 DIAGNOSIS — E1142 Type 2 diabetes mellitus with diabetic polyneuropathy: Secondary | ICD-10-CM

## 2019-09-19 DIAGNOSIS — M79675 Pain in left toe(s): Secondary | ICD-10-CM | POA: Diagnosis not present

## 2019-09-19 DIAGNOSIS — M79674 Pain in right toe(s): Secondary | ICD-10-CM | POA: Diagnosis not present

## 2019-09-19 DIAGNOSIS — B351 Tinea unguium: Secondary | ICD-10-CM

## 2019-09-19 MED ORDER — ONETOUCH DELICA LANCETS 33G MISC: 1 refills | Status: DC

## 2019-09-19 NOTE — Progress Notes (Signed)
Subjective: Sheila Morgan presents today at risk foot care. Pt has h/o NIDDM with PAD and callus(es) plantar aspect of both feet and painful mycotic toenails b/l that are difficult to trim. Pain interferes with ambulation. Aggravating factors include wearing enclosed shoe gear. Pain is relieved with periodic professional debridement.  Binnie Rail, MD is patient's PCP. Last visit was: 08/28/2019.  Sheila Morgan voices no new pedal problems on today's visit.  Past Medical History:  Diagnosis Date  . Arthritis   . Cancer ([place])    skin on nose .  mylenoma  . Coronary artery disease    status post post LAD angioplasty in 1993  and subsequent coronary artery bypass grafting x1 with a LIMA to the LAD July of 1994. She had stenting of her RCA with a bare-metal stent November 2002.  . Diabetes mellitus    2  . Gout   . Heart murmur   . Hematoma    Right groin  . Hemorrhoids   . Hiatal hernia   . History of blood transfusion   . HOH (hard of hearing)   . Hyperlipidemia   . Hypertension   . Left knee DJD   . Lightheadedness    has seen Dr Gwenlyn Found and ENT.. cant find out why  . Myocardial infarction ([place])   . Osteoporosis   . PONV (postoperative nausea and vomiting)   . Shingles 10/27/2013     Current Outpatient Medications on File Prior to Visit  Medication Sig Dispense Refill  . acetaminophen (TYLENOL) 325 MG tablet Take 2 tablets (650 mg total) by mouth every 6 (six) hours as needed for mild pain, fever or headache.    . alendronate (FOSAMAX) 70 MG tablet Take 70 mg by mouth every Saturday. Take with a full glass of water on an empty stomach.     Marland Kitchen amLODipine (NORVASC) 5 MG tablet Take 1.5 tablets (7.5 mg total) by mouth daily. 135 tablet 1  . aspirin 81 MG tablet Take 81 mg by mouth daily.    . Calcium Carbonate-Vitamin D (CALCIUM 600-D PO) Take 1 tablet by mouth 2 (two) times daily.     . carvedilol (COREG) 25 MG tablet TAKE 1 TABLET(25 MG) BY MOUTH TWICE DAILY WITH A MEAL 180  tablet 1  . colchicine 0.6 MG tablet TAKE 1 TABLET(0.6 MG) BY MOUTH EVERY OTHER DAY 45 tablet 0  . diclofenac Sodium (VOLTAREN) 1 % GEL Apply topically.    . furosemide (LASIX) 20 MG tablet TAKE 1 TABLET(20 MG) BY MOUTH DAILY 90 tablet 0  . isosorbide mononitrate (IMDUR) 30 MG 24 hr tablet TAKE 1 TABLET(30 MG) BY MOUTH DAILY 90 tablet 1  . levothyroxine (SYNTHROID) 50 MCG tablet TAKE 1 TABLET(50 MCG) BY MOUTH DAILY 90 tablet 1  . losartan (COZAAR) 100 MG tablet TAKE 1 TABLET(100 MG) BY MOUTH DAILY 90 tablet 1  . metFORMIN (GLUCOPHAGE) 500 MG tablet TAKE 1/2 TABLET(250 MG) BY MOUTH DAILY WITH LARGEST MEAL 45 tablet 2  . OneTouch Delica Lancets 99991111 MISC TEST BLOOD SUGAR TWICE DAILY 200 each 1  . ONETOUCH ULTRA test strip USE TO TEST BLOOD SUGAR TWICE DAILY 200 strip 3  . predniSONE (DELTASONE) 5 MG tablet Take 5 mg by mouth daily.    . simvastatin (ZOCOR) 40 MG tablet TAKE 1 TABLET(40 MG) BY MOUTH AT BEDTIME 90 tablet 1  . vitamin B-12 (CYANOCOBALAMIN) 1000 MCG tablet Take 1,000 mcg by mouth daily.       No current facility-administered medications  on file prior to visit.     No Known Allergies  Objective: Sheila Morgan is a pleasant 86 y.o. y.o. Patient Race: Black or African American [2]  female in NAD. AAO x 3.  Vitals:   09/19/19 0800  Temp: 97.9 F (36.6 C)    Vascular Examination: Neurovascular status unchanged b/l. Capillary refill time to digits immediate b/l. Palpable DP pulses b/l. Nonpalpable PT pulses b/l. Pedal hair absent b/l Skin temperature gradient within normal limits b/l. No edema noted b/l.  Dermatological Examination: Pedal skin is thin shiny, atrophic bilaterally. No open wounds bilaterally. No interdigital macerations bilaterally. Toenails 1-5 b/l elongated, dystrophic, thickened, crumbly with subungual debris and tenderness to dorsal palpation. Hyperkeratotic lesion(s) submet head 1 right foot, submet head 5 left foot and submet head 5 right foot.  No  erythema, no edema, no drainage, no flocculence.  Musculoskeletal: Normal muscle strength 5/5 to all lower extremity muscle groups bilaterally. No pain crepitus or joint limitation noted with ROM b/l. Hammertoes noted to the 2-5 bilaterally.  Neurological Examination: Protective sensation diminished with 10g monofilament b/l. Vibratory sensation diminished b/l.  Assessment: 1. Pain due to onychomycosis of toenails of both feet   2. Callus   3. Diabetic peripheral neuropathy associated with type 2 diabetes mellitus ([place])   4. PAD (peripheral artery disease) ([place])    Plan: -Examined patient. -No new findings. No new orders. -Continue diabetic foot care principles. Literature dispensed on today.  -Toenails 1-5 b/l were debrided in length and girth with sterile nail nippers and dremel without iatrogenic bleeding.  -Callus(es) submet head 1 right foot, submet head 5 left foot and submet head 5 right foot pared utilizing sterile scalpel blade without complication or incident. Total number debrided =3. -Patient to continue soft, supportive shoe gear daily. -Patient to report any pedal injuries to medical professional immediately. -Patient/POA to call should there be question/concern in the interim.  Return in about 3 months (around 12/20/2019) for diabetic nail and callus trim.  Marzetta Board, DPM

## 2019-09-19 NOTE — Patient Instructions (Signed)
Diabetes Mellitus and Foot Care Foot care is an important part of your health, especially when you have diabetes. Diabetes may cause you to have problems because of poor blood flow (circulation) to your feet and legs, which can cause your skin to:  Become thinner and drier.  Break more easily.  Heal more slowly.  Peel and crack. You may also have nerve damage (neuropathy) in your legs and feet, causing decreased feeling in them. This means that you may not notice minor injuries to your feet that could lead to more serious problems. Noticing and addressing any potential problems early is the best way to prevent future foot problems. How to care for your feet Foot hygiene  Wash your feet daily with warm water and mild soap. Do not use hot water. Then, pat your feet and the areas between your toes until they are completely dry. Do not soak your feet as this can dry your skin.  Trim your toenails straight across. Do not dig under them or around the cuticle. File the edges of your nails with an emery board or nail file.  Apply a moisturizing lotion or petroleum jelly to the skin on your feet and to dry, brittle toenails. Use lotion that does not contain alcohol and is unscented. Do not apply lotion between your toes. Shoes and socks  Wear clean socks or stockings every day. Make sure they are not too tight. Do not wear knee-high stockings since they may decrease blood flow to your legs.  Wear shoes that fit properly and have enough cushioning. Always look in your shoes before you put them on to be sure there are no objects inside.  To break in new shoes, wear them for just a few hours a day. This prevents injuries on your feet. Wounds, scrapes, corns, and calluses  Check your feet daily for blisters, cuts, bruises, sores, and redness. If you cannot see the bottom of your feet, use a mirror or ask someone for help.  Do not cut corns or calluses or try to remove them with medicine.  If you  find a minor scrape, cut, or break in the skin on your feet, keep it and the skin around it clean and dry. You may clean these areas with mild soap and water. Do not clean the area with peroxide, alcohol, or iodine.  If you have a wound, scrape, corn, or callus on your foot, look at it several times a day to make sure it is healing and not infected. Check for: ? Redness, swelling, or pain. ? Fluid or blood. ? Warmth. ? Pus or a bad smell. General instructions  Do not cross your legs. This may decrease blood flow to your feet.  Do not use heating pads or hot water bottles on your feet. They may burn your skin. If you have lost feeling in your feet or legs, you may not know this is happening until it is too late.  Protect your feet from hot and cold by wearing shoes, such as at the beach or on hot pavement.  Schedule a complete foot exam at least once a year (annually) or more often if you have foot problems. If you have foot problems, report any cuts, sores, or bruises to your health care provider immediately. Contact a health care provider if:  You have a medical condition that increases your risk of infection and you have any cuts, sores, or bruises on your feet.  You have an injury that is not   healing.  You have redness on your legs or feet.  You feel burning or tingling in your legs or feet.  You have pain or cramps in your legs and feet.  Your legs or feet are numb.  Your feet always feel cold.  You have pain around a toenail. Get help right away if:  You have a wound, scrape, corn, or callus on your foot and: ? You have pain, swelling, or redness that gets worse. ? You have fluid or blood coming from the wound, scrape, corn, or callus. ? Your wound, scrape, corn, or callus feels warm to the touch. ? You have pus or a bad smell coming from the wound, scrape, corn, or callus. ? You have a fever. ? You have a red line going up your leg. Summary  Check your feet every day  for cuts, sores, red spots, swelling, and blisters.  Moisturize feet and legs daily.  Wear shoes that fit properly and have enough cushioning.  If you have foot problems, report any cuts, sores, or bruises to your health care provider immediately.  Schedule a complete foot exam at least once a year (annually) or more often if you have foot problems. This information is not intended to replace advice given to you by your health care provider. Make sure you discuss any questions you have with your health care provider. Document Revised: 01/11/2019 Document Reviewed: 05/22/2016 Elsevier Patient Education  2020 Elsevier Inc.  

## 2019-09-24 ENCOUNTER — Other Ambulatory Visit: Payer: Self-pay | Admitting: Internal Medicine

## 2019-09-25 ENCOUNTER — Telehealth: Payer: Self-pay | Admitting: Internal Medicine

## 2019-09-25 DIAGNOSIS — I2510 Atherosclerotic heart disease of native coronary artery without angina pectoris: Secondary | ICD-10-CM

## 2019-09-25 NOTE — Progress Notes (Signed)
  Chronic Care Management   Note  09/25/2019 Name: Sheila Morgan MRN: [id number] DOB: 11-Oct-1932  Sheila Morgan is a 86 y.o. year old female who is a primary care patient of Burns, Claudina Lick, MD. I reached out to Sheila Morgan by phone today in response to a referral sent by Ms. Flavia Shipper PCP, Binnie Rail, MD.   Sheila Morgan was given information about Chronic Care Management services today including:  1. CCM service includes personalized support from designated clinical staff supervised by her physician, including individualized plan of care and coordination with other care providers 2. 24/7 contact phone numbers for assistance for urgent and routine care needs. 3. Service will only be billed when office clinical staff spend 20 minutes or more in a month to coordinate care. 4. Only one practitioner may furnish and bill the service in a calendar month. 5. The patient may stop CCM services at any time (effective at the end of the month) by phone call to the office staff.   Patient agreed to services and verbal consent obtained.   This note is not being shared with the patient for the following reason: To respect privacy (The patient or proxy has requested that the information not be shared).  Follow up plan:   Earney Hamburg [employer] Scheduler

## 2019-09-27 ENCOUNTER — Other Ambulatory Visit: Payer: Self-pay

## 2019-09-27 MED ORDER — CARVEDILOL 25 MG PO TABS: ORAL_TABLET | ORAL | 1 refills | Status: DC

## 2019-09-27 MED ORDER — FUROSEMIDE 20 MG PO TABS: ORAL_TABLET | ORAL | 1 refills | Status: DC

## 2019-10-04 ENCOUNTER — Encounter: Payer: Self-pay | Admitting: Internal Medicine

## 2019-10-10 DIAGNOSIS — E119 Type 2 diabetes mellitus without complications: Secondary | ICD-10-CM | POA: Diagnosis not present

## 2019-10-10 DIAGNOSIS — H349 Unspecified retinal vascular occlusion: Secondary | ICD-10-CM | POA: Diagnosis not present

## 2019-10-10 LAB — HM DIABETES EYE EXAM

## 2019-10-20 ENCOUNTER — Encounter: Payer: Self-pay | Admitting: Internal Medicine

## 2019-10-24 ENCOUNTER — Encounter: Payer: Self-pay | Admitting: Internal Medicine

## 2019-10-24 NOTE — Progress Notes (Signed)
Outside notes received. Information abstracted. Notes sent to scan.  

## 2019-12-04 ENCOUNTER — Other Ambulatory Visit: Payer: Self-pay

## 2019-12-04 ENCOUNTER — Ambulatory Visit: Payer: Medicare Other | Admitting: Pharmacist

## 2019-12-04 DIAGNOSIS — I10 Essential (primary) hypertension: Secondary | ICD-10-CM

## 2019-12-04 DIAGNOSIS — E782 Mixed hyperlipidemia: Secondary | ICD-10-CM

## 2019-12-04 DIAGNOSIS — E1151 Type 2 diabetes mellitus with diabetic peripheral angiopathy without gangrene: Secondary | ICD-10-CM

## 2019-12-04 DIAGNOSIS — I2510 Atherosclerotic heart disease of native coronary artery without angina pectoris: Secondary | ICD-10-CM

## 2019-12-04 NOTE — Chronic Care Management (AMB) (Signed)
Chronic Care Management Pharmacy  Name: Sheila Morgan  MRN: [id number] DOB: [birth year]   Chief Complaint/ HPI  Sheila Morgan,  86 y.o. , female presents for their Initial CCM visit with the clinical pharmacist via telephone due to COVID-19 Pandemic.  PCP : Sheila Rail, MD Patient Care Team: Sheila Rail, MD as PCP - General (Internal Medicine) Sheila Balding, MD as Consulting Physician (Orthopedic Surgery) Sheila Morgan, Sheila Morgan as Physician Assistant (Orthopedic Surgery) Sheila Morgan, Sheila Morgan as Consulting Physician (Podiatry) Sheila Morgan, Sheila Morgan, AUD (Audiology) Sheila Ducking, MD as Consulting Physician (Neurology) Sheila Morgan, [facility] as Pharmacist (Pharmacist)  Their chronic conditions include: Hypertension, Hyperlipidemia, Diabetes, Coronary Artery Disease, Hypothyroidism, Osteoporosis, Osteoarthritis and Gout   Spoke with patient's daughter Sheila Morgan who helps with medications, although for the past several weeks she has been recovering from brain surgery herself and her brother has mostly taken over. Patient lives alone currently, right now patient's son is coming over twice a day to help with medications and daily activities, and [employer] delivers once a day. Patient's children set up her pill box each week. Patient takes her own blood pressure and blood sugar each morning and writes them down.   Office Visits: 08/28/19 Dr Sheila Morgan OV: memory issues stable, chronic conditions stable, no med changes  Consult Visit: 09/19/19 Sheila Morgan Sheila Morgan (podiatry): no new findings. DM foot care.  No Known Allergies  Medications: Outpatient Encounter Medications as of 12/04/2019  Medication Sig  . acetaminophen (TYLENOL) 325 MG tablet Take 2 tablets (650 mg total) by mouth every 6 (six) hours as needed for mild pain, fever or headache.  . alendronate (FOSAMAX) 70 MG tablet Take 70 mg by mouth every Saturday. Take with a full glass of water on an  empty stomach.   Marland Kitchen amLODipine (NORVASC) 5 MG tablet Take 1.5 tablets (7.5 mg total) by mouth daily.  . Calcium Carbonate-Vitamin D (CALCIUM 600-D PO) Take 1 tablet by mouth 2 (two) times daily.   . carvedilol (COREG) 25 MG tablet TAKE 1 TABLET(25 MG) BY MOUTH TWICE DAILY WITH A MEAL  . colchicine 0.6 MG tablet TAKE 1 TABLET(0.6 MG) BY MOUTH EVERY OTHER DAY  . diclofenac Sodium (VOLTAREN) 1 % GEL Apply topically.  . furosemide (LASIX) 20 MG tablet TAKE 1 TABLET(20 MG) BY MOUTH DAILY  . isosorbide mononitrate (IMDUR) 30 MG 24 hr tablet TAKE 1 TABLET(30 MG) BY MOUTH DAILY  . levothyroxine (SYNTHROID) 50 MCG tablet TAKE 1 TABLET(50 MCG) BY MOUTH DAILY  . losartan (COZAAR) 100 MG tablet TAKE 1 TABLET(100 MG) BY MOUTH DAILY  . metFORMIN (GLUCOPHAGE) 500 MG tablet TAKE 1/2 TABLET(250 MG) BY MOUTH DAILY WITH LARGEST MEAL  . OneTouch Delica Lancets 64P MISC Use to test blood sugar 2 times daily. Dx Code- E11.51  . ONETOUCH ULTRA test strip USE TO TEST BLOOD SUGAR TWICE DAILY  . predniSONE (DELTASONE) 5 MG tablet Take 5 mg by mouth daily.  . simvastatin (ZOCOR) 40 MG tablet TAKE 1 TABLET(40 MG) BY MOUTH AT BEDTIME  . vitamin B-12 (CYANOCOBALAMIN) 1000 MCG tablet Take 1,000 mcg by mouth daily.    Marland Kitchen aspirin 81 MG tablet Take 81 mg by mouth daily. (Patient not taking: Reported on 12/04/2019)   No facility-administered encounter medications on file as of 12/04/2019.     Current Diagnosis/Assessment:  SDOH Interventions     Most Recent Value  SDOH Interventions  Financial Strain Interventions Intervention Not Indicated  Goals Addressed            This Visit's Progress   . Pharmacy Care Plan       CARE PLAN ENTRY (see longitudinal plan of care for additional care plan information)  Current Barriers:  . Chronic Disease Management support, education, and care coordination needs related to Hypertension, Hyperlipidemia, Diabetes, and Coronary Artery Disease   Hypertension BP Readings from  Last 3 Encounters:  08/28/19 (!) 170/82  07/05/19 124/63  06/13/19 (!) 179/79 .  Pharmacist Clinical Goal(s): o Over the next 90 days, patient will work with PharmD and providers to achieve BP goal <140/90 . Current regimen:  o Amlodipine 5 mg - 1.5 tablets daily o Carvedilol 25 mg twice a day o Furosemide 20 mg daily o Isosorbide MN 30 mg daily o Losartan 100 mg daily  . Interventions: o Discussed BP goals and benefits of medication for prevention of heart attack / stroke . Patient self care activities - Over the next 90 days, patient will: o Take medications as prescribed o Check BP daily, document, and provide at future appointments o Ensure daily salt intake < 2300 mg/day  Hyperlipidemia / CAD Lab Results  Component Value Date/Time   LDLCALC 71 08/28/2019 09:49 AM   LDLCALC 91 06/17/2017 08:38 AM .  Pharmacist Clinical Goal(s): o Over the next 90 days, patient will work with PharmD and providers to achieve LDL goal < 70 . Current regimen:  . Simvastatin 40 mg daily at bedtime . Isosorbide MN 30 mg daily . Interventions: o Discussed cholesterol goals and benefits of medication for prevention of heart attack / stroke o Recommend to continue aspirin 81 mg due to history of heart surgery . Patient self care activities - Over the next 90 days, patient will: o Continue current medications including aspirin 81 mg daily  Diabetes Lab Results  Component Value Date/Time   HGBA1C 6.6 (H) 08/28/2019 09:49 AM   HGBA1C 7.0 (H) 01/11/2019 09:37 AM .  Pharmacist Clinical Goal(s): o Over the next 90 days, patient will work with PharmD and providers to maintain A1c goal <7% . Current regimen:  o Metformin 500 mg - 1/2 tablet once daily . Interventions: o Discussed A1c and blood sugar goals and benefits of medication for prevention of diabetic complications . Patient self care activities - Over the next 90 days, patient will: o Check blood sugar once daily and in the morning before  eating or drinking, document, and provide at future appointments o Contact provider with any episodes of hypoglycemia  Medication management . Pharmacist Clinical Goal(s): o Over the next 90 days, patient will work with PharmD and providers to maintain optimal medication adherence . Current pharmacy: [facility] . Interventions o Comprehensive medication review performed. o Continue current medication management strategy . Patient self care activities - Over the next 90 days, patient will: o Focus on medication adherence by pill box o Take medications as prescribed o Report any questions or concerns to PharmD and/or provider(s)  Initial goal documentation       Hypertension   BP goal is:  <140/90  Office blood pressures are  BP Readings from Last 3 Encounters:  08/28/19 (!) 170/82  07/05/19 124/63  06/13/19 (!) 179/79   Patient checks BP at home daily Patient home BP readings are ranging: unknown  Patient has failed these meds in the past: n/a Patient is currently controlled on the following medications:  . Amlodipine 5 mg - 1.5 tablets daily . Carvedilol 25 mg BID .  Furosemide 20 mg daily . Isosorbide MN 30 mg daily . Losartan 100 mg daily   We discussed BP goals; benefits of medications; caregiver reports pt has not mentioned any side effects, denies dizziness, lightheadedness, or recent falls.  Plan  Continue current medications   Hyperlipidemia / CAD   LDL goal < 70 CABG 1994, PCI 2002  Lipid Panel     Component Value Date/Time   CHOL 154 08/28/2019 0949   CHOL 196 06/17/2017 0838   TRIG 113.0 08/28/2019 0949   HDL 60.30 08/28/2019 0949   HDL 82 06/17/2017 0838   LDLCALC 71 08/28/2019 0949   LDLCALC 91 06/17/2017 0838    Hepatic Function Latest Ref Rng & Units 08/28/2019 01/11/2019 07/15/2018  Total Protein 6.0 - 8.3 g/dL 7.1 7.2 5.2(L)  Albumin 3.5 - 5.2 g/dL 4.2 4.1 2.7(L)  AST 0 - 37 U/L _0 ALT 0 - 35 U/L _1 Alk Phosphatase 39 - 117  U/L 64 64 54  Total Bilirubin 0.2 - 1.2 mg/dL 0.6 0.5 1.2  Bilirubin, Direct 0.00 - 0.40 mg/dL - - -    The ASCVD Risk score Mikey Bussing DC Jr., et al., 2013) failed to calculate for the following reasons:   The 2013 ASCVD risk score is only valid for ages 57 to 84   Patient has failed these meds in past: n/a Patient is currently controlled on the following medications:  . Simvastatin 40 mg daily . Isosorbide MN 30 mg daily . Aspirin 81 mg  - possibly not taking  We discussed:  Cholesterol goals; benefits of statin; indication for aspirin given history of CABG, PCI; caregiver reports when she was setting up pill boxes she was not including aspirin. Caregiver is planning to visit patient tomorrow and will net me know if aspirin is in the pill box.  Plan  Continue current medications  Diabetes   A1c goal <7%  Recent Relevant Labs: Lab Results  Component Value Date/Time   HGBA1C 6.6 (H) 08/28/2019 09:49 AM   HGBA1C 7.0 (H) 01/11/2019 09:37 AM   GFR 52.17 (L) 08/28/2019 09:49 AM   GFR 53.28 (L) 01/11/2019 09:37 AM   MICROALBUR 1.6 02/12/2014 09:31 AM   MICROALBUR 2.1 (H) 07/10/2013 10:33 AM    Last diabetic Eye exam:  Lab Results  Component Value Date/Time   HMDIABEYEEXA No Retinopathy 10/10/2019 12:00 AM    Last diabetic Foot exam:  Lab Results  Component Value Date/Time   HMDIABFOOTEX done 12/19/2018 12:00 AM    Checking BG: Daily  Recent FBG Readings: unknown  Patient has failed these meds in past: n/a Patient is currently controlled on the following medications: Marland Kitchen Metformin 500 mg - 1/2 tablet daily  We discussed: A1c goals; benefit of metformin; pt may not need to check BG every day since A1c is at goal and she only takes 1 oral med  Plan  Continue current medications  Gout   Patient has failed these meds in past: n/a Patient is currently controlled on the following medications:  . Colchicine 0.6 mg every other day  We discussed:  Per caregiver pt has not  reported a gout flare recently;   Plan  Continue current medications  Polymyalgia rheumatica / Osteoarthritis   Patient has failed these meds in past: n/a Patient is currently controlled on the following medications:  . Prednisone 5 mg daily . Diclofenac 1% gel . Tylenol 325 mg - 2 tablets q6h PRN  We discussed:  Caregiver was not  sure of indication for prednisone - she assumed for gout. Discussed prednisone as a treatment for autoimmune disease. Discussed common side effects of steroids, including increased appetite, weight gain, agitation, insomnia, high BP and BG - caregiver denies.  Plan  Continue current medications  Hypothyroidism   Lab Results  Component Value Date/Time   TSH 1.05 08/28/2019 09:49 AM   TSH 1.48 01/11/2019 09:37 AM   FREET4 0.88 08/15/2014 07:25 AM   Patient has failed these meds in past: n/a Patient is currently controlled on the following medications:  . Levothyroxine 50 mcg daily  We discussed:  Pt takes med on empty stomach separate from other medications  Plan  Continue current medications   Osteoporosis   Last DEXA Scan: 06/01/2018   T-Score femoral neck: -2.5  T-Score total hip: -1.9  T-Score lumbar spine: -2.0  VITD  Date Value Ref Range Status  08/28/2019 70.00 30.00 - 100.00 ng/mL Final    Patient is a candidate for pharmacologic treatment due to T-Score < -2.5 in femoral neck  Patient has failed these meds in past: n/a Patient is currently controlled on the following medications:  . Alendronate 70 mg weekly (1st prescribed 2012) . Calcium carbonate-Vitamin D   We discussed:  Recommend [id number] units of vitamin D daily. Recommend 1200 mg of calcium daily from dietary and supplemental sources. Counseled on oral bisphosphonate administration: take in the morning, 30 minutes prior to food with 6-8 oz of water. Do not lie down for at least 30 minutes after taking.  Plan  Continue current medications   Health Maintenance    Lab Results  Component Value Date/Time   VITAMINB12 >2000 (H) 06/09/2018 10:03 AM   VITAMINB12 >1500 (H) 06/13/2015 11:10 AM   VITAMINB12 >2,000 (H) 06/21/2011 08:43 AM   VITAMINB12 268 01/12/2008 11:22 AM   Patient is currently controlled on the following medications:  Marland Kitchen Vitamin B12 1000 mcg daily  We discussed:  Vitamin B12 levels have been high the last 4 times it was checked; discussed pt does not need to take this she she is no longer deficient  Plan  Recommend to stop vitamin B12 supplementation  Medication Management   Pt uses [facility] for all medications Uses pill box? Yes Pt endorses 100% compliance  We discussed: [facility] is the preferred [facility] with her insurance; caregiver denies issues with services; currently patient's children set up the pill box for pt each week; discussed benefits of pill pack, caregiver prefers to continue current management since it will be more difficult to account for dose changes with pill packs.  Plan  Continue current medication management strategy    Follow up: 3 month phone visit  Charlene Brooke, PharmD, [facility] Clinical Pharmacist [facility] [phone]

## 2019-12-04 NOTE — Patient Instructions (Addendum)
Visit Information  Phone number for Pharmacist: [phone]  Thank you for meeting with me to discuss your medications! I look forward to working with you to achieve your health care goals. Below is a summary of what we talked about during the visit:  Goals Addressed            This Visit's Progress   . Pharmacy Care Plan       CARE PLAN ENTRY (see longitudinal plan of care for additional care plan information)  Current Barriers:  . Chronic Disease Management support, education, and care coordination needs related to Hypertension, Hyperlipidemia, Diabetes, and Coronary Artery Disease   Hypertension BP Readings from Last 3 Encounters:  08/28/19 (!) 170/82  07/05/19 124/63  06/13/19 (!) 179/79 .  Pharmacist Clinical Goal(s): o Over the next 90 days, patient will work with PharmD and providers to achieve BP goal <140/90 . Current regimen:  o Amlodipine 5 mg - 1.5 tablets daily o Carvedilol 25 mg twice a day o Furosemide 20 mg daily o Isosorbide MN 30 mg daily o Losartan 100 mg daily  . Interventions: o Discussed BP goals and benefits of medication for prevention of heart attack / stroke . Patient self care activities - Over the next 90 days, patient will: o Take medications as prescribed o Check BP daily, document, and provide at future appointments o Ensure daily salt intake < 2300 mg/day  Hyperlipidemia / CAD Lab Results  Component Value Date/Time   LDLCALC 71 08/28/2019 09:49 AM   LDLCALC 91 06/17/2017 08:38 AM .  Pharmacist Clinical Goal(s): o Over the next 90 days, patient will work with PharmD and providers to achieve LDL goal < 70 . Current regimen:  . Simvastatin 40 mg daily at bedtime . Isosorbide MN 30 mg daily . Interventions: o Discussed cholesterol goals and benefits of medication for prevention of heart attack / stroke o Recommend to continue aspirin 81 mg due to history of heart surgery . Patient self care activities - Over the next 90 days, patient  will: o Continue current medications including aspirin 81 mg daily  Diabetes Lab Results  Component Value Date/Time   HGBA1C 6.6 (H) 08/28/2019 09:49 AM   HGBA1C 7.0 (H) 01/11/2019 09:37 AM .  Pharmacist Clinical Goal(s): o Over the next 90 days, patient will work with PharmD and providers to maintain A1c goal <7% . Current regimen:  o Metformin 500 mg - 1/2 tablet once daily . Interventions: o Discussed A1c and blood sugar goals and benefits of medication for prevention of diabetic complications . Patient self care activities - Over the next 90 days, patient will: o Check blood sugar once daily and in the morning before eating or drinking, document, and provide at future appointments o Contact provider with any episodes of hypoglycemia  Medication management . Pharmacist Clinical Goal(s): o Over the next 90 days, patient will work with PharmD and providers to maintain optimal medication adherence . Current pharmacy: [facility] . Interventions o Comprehensive medication review performed. o Continue current medication management strategy . Patient self care activities - Over the next 90 days, patient will: o Focus on medication adherence by pill box o Take medications as prescribed o Report any questions or concerns to PharmD and/or provider(s)  Initial goal documentation       Sheila Morgan was given information about Chronic Care Management services today including:  1. CCM service includes personalized support from designated clinical staff supervised by her physician, including individualized plan of care and coordination  with other care providers 2. 24/7 contact phone numbers for assistance for urgent and routine care needs. 3. Standard insurance, coinsurance, copays and deductibles apply for chronic care management only during months in which we provide at least 20 minutes of these services. Most insurances cover these services at 100%, however patients may be responsible for  any copay, coinsurance and/or deductible if applicable. This service may help you avoid the need for more expensive face-to-face services. 4. Only one practitioner may furnish and bill the service in a calendar month. 5. The patient may stop CCM services at any time (effective at the end of the month) by phone call to the office staff.  Patient agreed to services and verbal consent obtained.   Patient verbalizes understanding of instructions provided today.  Telephone follow up appointment with pharmacy team member scheduled for: 3 months  Charlene Brooke, PharmD Clinical Pharmacist [facility] [phone]  Aspirin and Your Heart  Aspirin is a medicine that prevents the cells in the blood that are used for clotting, called platelets, from sticking together. Aspirin can be used to help reduce the risk of blood clots, heart attacks, and other heart-related problems. Can I take aspirin? Your health care provider will help you determine whether it is safe and beneficial for you to take aspirin daily. Taking aspirin daily may be helpful if you:  Have had a heart attack or chest pain.  Are at risk for a heart attack.  Have undergone open-heart surgery, such as coronary artery bypass surgery (CABG).  Have had coronary angioplasty or a stent.  Have had certain types of stroke or transient ischemic attack (TIA).  Have peripheral artery disease (PAD).  Have chronic heart rhythm problems such as atrial fibrillation and cannot take an anticoagulant.  Have valve disease or have had surgery on a valve. What are the risks? Daily use of aspirin can cause side effects. Some of these include:  Bleeding. Bleeding problems can be minor or serious. An example of a minor problem is a cut that does not stop bleeding. An example of a more serious problem is stomach bleeding or, rarely, bleeding into the brain. Your risk of bleeding is increased if you are also taking  non-steroidal anti-inflammatory drugs (NSAIDs).  Increased bruising.  Upset stomach.  An allergic reaction. People who have nasal polyps have an increased risk of developing an aspirin allergy. General guidelines  Take aspirin only as told by your health care provider. Make sure that you understand how much you should take and what form you should take. The two forms of aspirin are: ? Non-enteric-coated.This type of aspirin does not have a coating and is absorbed quickly. This type of aspirin also comes in a chewable form. ? Enteric-coated. This type of aspirin has a coating that releases the medicine very slowly. Enteric-coated aspirin might cause less stomach upset than non-enteric-coated aspirin. This type of aspirin should not be chewed or crushed.  Limit alcohol intake to no more than 1 drink a day for nonpregnant women and 2 drinks a day for men. Drinking alcohol increases your risk of bleeding. One drink equals 12 oz of beer, 5 oz of wine, or 1 oz of hard liquor. Contact a health care provider if you:  Have unusual bleeding or bruising.  Have stomach pain or nausea.  Have ringing in your ears.  Have an allergic reaction that causes: ? Hives. ? Itchy skin. ? Swelling of the lips, tongue, or face. Get help right  away if you:  Notice that your bowel movements are bloody, dark red, or black in color.  Vomit or cough up blood.  Have blood in your urine.  Cough, have noisy breathing (wheeze), or feel short of breath.  Have chest pain, especially if the pain spreads to the arms, back, neck, or jaw.  Have a severe headache, or a headache with confusion, or dizziness. These symptoms may represent a serious problem that is an emergency. Do not wait to see if the symptoms will go away. Get medical help right away. Call your local emergency services (911 in the [place] Do not drive yourself to the hospital. Summary  Aspirin can be used to help reduce the risk of blood clots,  heart attacks, and other heart-related problems.  Daily use of aspirin can increase your risk of side effects. Your health care provider will help you determine whether it is safe and beneficial for you to take aspirin daily.  Take aspirin only as told by your health care provider. Make sure that you understand how much you can take and what form you can take. This information is not intended to replace advice given to you by your health care provider. Make sure you discuss any questions you have with your health care provider. Document Revised: 02/18/2017 Document Reviewed: 02/18/2017 Elsevier Patient Education  2020 [employer].

## 2019-12-07 NOTE — Addendum Note (Signed)
Addended by: Karle Barr on: 12/07/2019 04:24 PM   Modules accepted: Orders

## 2019-12-14 ENCOUNTER — Ambulatory Visit: Payer: Medicare Other | Admitting: Internal Medicine

## 2019-12-14 ENCOUNTER — Telehealth: Payer: Self-pay | Admitting: Internal Medicine

## 2019-12-14 DIAGNOSIS — M7989 Other specified soft tissue disorders: Secondary | ICD-10-CM | POA: Diagnosis not present

## 2019-12-14 DIAGNOSIS — M1189 Other specified crystal arthropathies, multiple sites: Secondary | ICD-10-CM | POA: Diagnosis not present

## 2019-12-14 DIAGNOSIS — M25561 Pain in right knee: Secondary | ICD-10-CM | POA: Diagnosis not present

## 2019-12-14 DIAGNOSIS — M810 Age-related osteoporosis without current pathological fracture: Secondary | ICD-10-CM | POA: Diagnosis not present

## 2019-12-14 DIAGNOSIS — Z79899 Other long term (current) drug therapy: Secondary | ICD-10-CM | POA: Diagnosis not present

## 2019-12-14 DIAGNOSIS — M170 Bilateral primary osteoarthritis of knee: Secondary | ICD-10-CM | POA: Diagnosis not present

## 2019-12-14 DIAGNOSIS — M1711 Unilateral primary osteoarthritis, right knee: Secondary | ICD-10-CM | POA: Diagnosis not present

## 2019-12-14 DIAGNOSIS — Z7952 Long term (current) use of systemic steroids: Secondary | ICD-10-CM | POA: Diagnosis not present

## 2019-12-14 DIAGNOSIS — M150 Primary generalized (osteo)arthritis: Secondary | ICD-10-CM | POA: Diagnosis not present

## 2019-12-14 NOTE — Telephone Encounter (Signed)
Call from [facility] associates Dr. Jolee Ewing. Patient seen today (rheumatology) with CPP arthritis given treatment but complaining also of leg swelling unilateral. US done in office with preliminary report showing high probability of DVT. Please call patient and arrange visit for her today to treat appropriately at the office since PCP out of office.

## 2019-12-14 NOTE — Telephone Encounter (Signed)
Called Sheila Morgan to make her aware of appt with Dr Sharlet Salina today at 3pm. Can be switched to virtual if patient request.Please make Sheila Morgan aware of appt if she calls back thanks.

## 2019-12-15 ENCOUNTER — Encounter: Payer: Self-pay | Admitting: Internal Medicine

## 2019-12-15 ENCOUNTER — Telehealth (INDEPENDENT_AMBULATORY_CARE_PROVIDER_SITE_OTHER): Payer: Medicare Other | Admitting: Internal Medicine

## 2019-12-15 DIAGNOSIS — I824Y2 Acute embolism and thrombosis of unspecified deep veins of left proximal lower extremity: Secondary | ICD-10-CM

## 2019-12-15 DIAGNOSIS — I2510 Atherosclerotic heart disease of native coronary artery without angina pectoris: Secondary | ICD-10-CM

## 2019-12-15 DIAGNOSIS — I82402 Acute embolism and thrombosis of unspecified deep veins of left lower extremity: Secondary | ICD-10-CM | POA: Insufficient documentation

## 2019-12-15 MED ORDER — FUROSEMIDE 20 MG PO TABS: 40.0000 mg | ORAL_TABLET | Freq: Every day | ORAL | 0 refills | Status: DC

## 2019-12-15 MED ORDER — RIVAROXABAN 20 MG PO TABS: 20.0000 mg | ORAL_TABLET | Freq: Every day | ORAL | 2 refills | Status: DC

## 2019-12-15 NOTE — Progress Notes (Signed)
Virtual Visit via Video Note  I connected with Sheila Morgan on 12/15/19 at  2:00 PM EDT by a video enabled telemedicine application and verified that I am speaking with the correct person using two identifiers.  The patient and the provider were at separate locations throughout the entire encounter. Patient location: home, Provider location: work   I discussed the limitations of evaluation and management by telemedicine and the availability of in person appointments. The patient expressed understanding and agreed to proceed. The patient and the provider were the only parties present for the visit unless noted in HPI below.  History of Present Illness: The patient is a 86 y.o. female with visit for likely left leg DVT. Seen at her rheumatologist and they were doing visit but noticed left leg swelling and pain. She is not sure exactly when it started but she feels at least a month or more from that visit. They did doppler in the office which was read as two veins not fully compressible although patient did not tolerate compression well and should be interpreted as DVT age indeterminate. She denies recent travel or incapacity prior to swelling although since her knee replacement in 2015 she does not get around as well and chronically has swelling in the knee and pain. Denies pain with breathing, SOB.   Observations/Objective: Appearance: normal, leg not visualized due to camera angle, breathing appears normal, casual grooming, abdomen does not appear distended, throat not visualized, mental status is A and O times 3  Assessment and Plan: See problem oriented charting  Follow Up Instructions: Start xarelto, increase lasix to 40 mg daily 2 weeks then back to 20 mg daily, follow up PCP 1 month or sooner if needed  I discussed the assessment and treatment plan with the patient. The patient was provided an opportunity to ask questions and all were answered. The patient agreed with the plan and  demonstrated an understanding of the instructions.   The patient was advised to call back or seek an in-person evaluation if the symptoms worsen or if the condition fails to improve as anticipated.  Hoyt Koch, MD

## 2019-12-15 NOTE — Assessment & Plan Note (Signed)
Unclear timeline of acute versus chronic versus sub-acute DVT. Will start xarelto at 20 mg daily dosing for simplicity of patient and family. No signs of symptoms of PE. Increase lasix to 40 mg temporarily to see if this helps swelling. Follow up PCP 1 month but would recommend 3 months therapy with blood thinner and this was recommended to patient and daughter today.

## 2019-12-22 ENCOUNTER — Encounter: Payer: Self-pay | Admitting: Podiatry

## 2019-12-22 ENCOUNTER — Other Ambulatory Visit: Payer: Self-pay

## 2019-12-22 ENCOUNTER — Ambulatory Visit (INDEPENDENT_AMBULATORY_CARE_PROVIDER_SITE_OTHER): Payer: Medicare Other | Admitting: Podiatry

## 2019-12-22 DIAGNOSIS — E1142 Type 2 diabetes mellitus with diabetic polyneuropathy: Secondary | ICD-10-CM

## 2019-12-22 DIAGNOSIS — L84 Corns and callosities: Secondary | ICD-10-CM | POA: Diagnosis not present

## 2019-12-22 DIAGNOSIS — I739 Peripheral vascular disease, unspecified: Secondary | ICD-10-CM

## 2019-12-22 DIAGNOSIS — M79674 Pain in right toe(s): Secondary | ICD-10-CM

## 2019-12-22 DIAGNOSIS — Z23 Encounter for immunization: Secondary | ICD-10-CM | POA: Diagnosis not present

## 2019-12-22 DIAGNOSIS — M79675 Pain in left toe(s): Secondary | ICD-10-CM

## 2019-12-22 DIAGNOSIS — B351 Tinea unguium: Secondary | ICD-10-CM

## 2019-12-23 ENCOUNTER — Other Ambulatory Visit: Payer: Self-pay | Admitting: Internal Medicine

## 2019-12-23 NOTE — Progress Notes (Signed)
Subjective: Sheila Morgan presents today at risk foot care. Pt has h/o NIDDM with PAD and callus(es) plantar aspect of both feet and painful mycotic toenails b/l that are difficult to trim. Pain interferes with ambulation. Aggravating factors include wearing enclosed shoe gear. Pain is relieved with periodic professional debridement.  Binnie Rail, MD is patient's PCP. Last visit was: 08/28/2019.  [name] states she was diagnosed with a blood clot of her left lower extremity. She is on blood thinners.  Past Medical History:  Diagnosis Date  . Arthritis   . Cancer ([place])    skin on nose .  mylenoma  . Coronary artery disease    status post post LAD angioplasty in 1993  and subsequent coronary artery bypass grafting x1 with a LIMA to the LAD July of 1994. She had stenting of her RCA with a bare-metal stent November 2002.  . Diabetes mellitus    2  . Gout   . Heart murmur   . Hematoma    Right groin  . Hemorrhoids   . Hiatal hernia   . History of blood transfusion   . HOH (hard of hearing)   . Hyperlipidemia   . Hypertension   . Left knee DJD   . Lightheadedness    has seen Dr Gwenlyn Found and ENT.. cant find out why  . Myocardial infarction ([place])   . Osteoporosis   . PONV (postoperative nausea and vomiting)   . Shingles 10/27/2013     Current Outpatient Medications on File Prior to Visit  Medication Sig Dispense Refill  . acetaminophen (TYLENOL) 325 MG tablet Take 2 tablets (650 mg total) by mouth every 6 (six) hours as needed for mild pain, fever or headache.    . alendronate (FOSAMAX) 70 MG tablet Take 70 mg by mouth every Saturday. Take with a full glass of water on an empty stomach.     Marland Kitchen amLODipine (NORVASC) 5 MG tablet Take 1.5 tablets (7.5 mg total) by mouth daily. 135 tablet 1  . Calcium Carbonate-Vitamin D (CALCIUM 600-D PO) Take 1 tablet by mouth 2 (two) times daily.     . carvedilol (COREG) 25 MG tablet TAKE 1 TABLET(25 MG) BY MOUTH TWICE DAILY WITH A MEAL 180 tablet 1   . colchicine 0.6 MG tablet TAKE 1 TABLET(0.6 MG) BY MOUTH EVERY OTHER DAY 45 tablet 0  . diclofenac Sodium (VOLTAREN) 1 % GEL Apply topically.    . furosemide (LASIX) 20 MG tablet Take 2 tablets (40 mg total) by mouth daily. 180 tablet 0  . isosorbide mononitrate (IMDUR) 30 MG 24 hr tablet TAKE 1 TABLET(30 MG) BY MOUTH DAILY 90 tablet 1  . levothyroxine (SYNTHROID) 50 MCG tablet TAKE 1 TABLET(50 MCG) BY MOUTH DAILY 90 tablet 1  . losartan (COZAAR) 100 MG tablet TAKE 1 TABLET(100 MG) BY MOUTH DAILY 90 tablet 1  . metFORMIN (GLUCOPHAGE) 500 MG tablet TAKE 1/2 TABLET(250 MG) BY MOUTH DAILY WITH LARGEST MEAL 45 tablet 2  . OneTouch Delica Lancets 38S MISC Use to test blood sugar 2 times daily. Dx Code- E11.51 200 each 1  . ONETOUCH ULTRA test strip USE TO TEST BLOOD SUGAR TWICE DAILY 200 strip 3  . rivaroxaban (XARELTO) 20 MG TABS tablet Take 1 tablet (20 mg total) by mouth daily with supper. 30 tablet 2  . simvastatin (ZOCOR) 40 MG tablet TAKE 1 TABLET(40 MG) BY MOUTH AT BEDTIME 90 tablet 1  . vitamin B-12 (CYANOCOBALAMIN) 1000 MCG tablet Take 1,000 mcg by mouth  daily.      . predniSONE (DELTASONE) 5 MG tablet Take 5 mg by mouth daily.     No current facility-administered medications on file prior to visit.     No Known Allergies  Objective: Sheila Morgan is a pleasant 86 y.o. African American female in NAD. AAO x 3.  There were no vitals filed for this visit.  Vascular Examination: Neurovascular status unchanged b/l. Capillary refill time to digits immediate b/l. Palpable DP pulses b/l. Nonpalpable PT pulses b/l. Pedal hair absent b/l Skin temperature gradient within normal limits b/l. No edema noted b/l.  Dermatological Examination: Pedal skin is thin shiny, atrophic bilaterally. No open wounds bilaterally. No interdigital macerations bilaterally. Toenails 1-5 b/l elongated, dystrophic, thickened, crumbly with subungual debris and tenderness to dorsal palpation. Hyperkeratotic  lesion(s) submet head 1 right foot, submet head 5 left foot and submet head 5 right foot.  No erythema, no edema, no drainage, no flocculence.  Musculoskeletal: Normal muscle strength 5/5 to all lower extremity muscle groups bilaterally. No pain crepitus or joint limitation noted with ROM b/l. Hammertoes noted to the 2-5 bilaterally.  Neurological Examination: Protective sensation diminished with 10g monofilament b/l. Vibratory sensation diminished b/l.  Assessment: 1. Pain due to onychomycosis of toenails of both feet   2. Callus   3. PAD (peripheral artery disease) ([place])   4. Diabetic peripheral neuropathy associated with type 2 diabetes mellitus ([place])    Plan: -Examined patient. -No new findings. No new orders. -Continue diabetic foot care principles. Literature dispensed on today.  -Toenails 1-5 b/l were debrided in length and girth with sterile nail nippers and dremel without iatrogenic bleeding.  -Callus(es) submet head 1 right foot, submet head 5 left foot and submet head 5 right foot pared utilizing sterile scalpel blade without complication or incident. Total number debrided =3. -Patient to continue soft, supportive shoe gear daily. -Patient to report any pedal injuries to medical professional immediately. -Patient/POA to call should there be question/concern in the interim.  Return in about 3 months (around 03/23/2020).  Marzetta Board, DPM

## 2020-01-11 NOTE — Patient Instructions (Addendum)
Stop xarelto mid- end of November.    Blood work was ordered.     Medications reviewed and updated.  Changes include :   none  Your prescription(s) have been submitted to your pharmacy. Please take as directed and contact our office if you believe you are having problem(s) with the medication(s).    Please followup in 6 months, sooner if needed

## 2020-01-11 NOTE — Progress Notes (Signed)
Subjective:    Patient ID: Sheila Morgan, female    DOB: [birth year], 86 y.o.   MRN: [id number]  HPI The patient is here for follow up of their chronic medical problems, including DM, CAD, htn, hypothyroidism, hyperlipidemia, memory d/o, anemia, renal insuff.   Her daughter is here with her.   She was started on xarelto 20 mg daily for DVT in LLE on 12/15/19.Marland Kitchen  She was placed on a higher dose of lasix - 40 mg daily.  Her leg swelling is better, but her left leg is still swollen.  She is taking all of her medication as prescribed.    Patient has no concerns.   Medications and allergies reviewed with patient and updated if appropriate.  Patient Active Problem List   Diagnosis Date Noted  . Left leg DVT ([place]), 12/15/19 12/15/2019  . Urinary frequency 02/22/2019  . Confusion 02/22/2019  . Fall 07/14/2018  . Bilateral hearing loss 01/05/2018  . Memory difficulties 01/05/2018  . Multiple closed fractures of ribs of right side 01/19/2017  . Acute pain of right shoulder 01/05/2017  . Rib pain on right side 01/05/2017  . Acute pain of right knee 01/05/2017  . B12 deficiency 06/13/2015  . Hypothyroidism 06/13/2015  . DM (diabetes mellitus) type II, controlled, with peripheral vascular disorder ([place]) 03/06/2014  . Gout 10/15/2013  . Arthritis   . Hiatal hernia   . Coronary artery disease   . Left knee DJD   . Benign paroxysmal positional vertigo 05/19/2013  . Weakness generalized 05/09/2013  . Pseudogout of left knee 03/29/2013  . Acute right lumbar radiculopathy 03/18/2013  . Shoulder impingement syndrome 01/25/2013  . Vertigo 10/17/2012  . Renal insufficiency, mild 06/20/2011  . Essential hypertension 12/31/2009  . Osteoporosis 12/31/2009  . SKIN CANCER, HX OF 12/31/2009  . Proteinuria 01/05/2008  . Anemia 12/21/2007  . Hyperlipidemia 09/08/2006  . Polymyalgia rheumatica ([place]) 09/08/2006  . CORONARY ARTERY BYPASS GRAFT, HX OF 09/08/2006    Current Outpatient  Medications on File Prior to Visit  Medication Sig Dispense Refill  . acetaminophen (TYLENOL) 325 MG tablet Take 2 tablets (650 mg total) by mouth every 6 (six) hours as needed for mild pain, fever or headache.    . alendronate (FOSAMAX) 70 MG tablet Take 70 mg by mouth every Saturday. Take with a full glass of water on an empty stomach.     Marland Kitchen amLODipine (NORVASC) 5 MG tablet Take 1.5 tablets (7.5 mg total) by mouth daily. 135 tablet 1  . Calcium Carbonate-Vitamin D (CALCIUM 600-D PO) Take 1 tablet by mouth 2 (two) times daily.     . carvedilol (COREG) 25 MG tablet TAKE 1 TABLET(25 MG) BY MOUTH TWICE DAILY WITH A MEAL 180 tablet 1  . colchicine 0.6 MG tablet TAKE 1 TABLET(0.6 MG) BY MOUTH EVERY OTHER DAY 45 tablet 0  . diclofenac Sodium (VOLTAREN) 1 % GEL Apply topically.    . furosemide (LASIX) 20 MG tablet Take 2 tablets (40 mg total) by mouth daily. 180 tablet 0  . isosorbide mononitrate (IMDUR) 30 MG 24 hr tablet TAKE 1 TABLET(30 MG) BY MOUTH DAILY 90 tablet 1  . levothyroxine (SYNTHROID) 50 MCG tablet TAKE 1 TABLET(50 MCG) BY MOUTH DAILY 90 tablet 1  . losartan (COZAAR) 100 MG tablet TAKE 1 TABLET(100 MG) BY MOUTH DAILY 90 tablet 1  . metFORMIN (GLUCOPHAGE) 500 MG tablet TAKE 1/2 TABLET(250 MG) BY MOUTH DAILY WITH LARGEST MEAL 45 tablet 2  . OneTouch Delica Lancets  33G MISC Use to test blood sugar 2 times daily. Dx Code- E11.51 200 each 1  . ONETOUCH ULTRA test strip USE TO TEST BLOOD SUGAR TWICE DAILY 200 strip 3  . predniSONE (DELTASONE) 5 MG tablet Take 5 mg by mouth daily.    . rivaroxaban (XARELTO) 20 MG TABS tablet Take 1 tablet (20 mg total) by mouth daily with supper. 30 tablet 2  . simvastatin (ZOCOR) 40 MG tablet TAKE 1 TABLET(40 MG) BY MOUTH AT BEDTIME 90 tablet 1  . vitamin B-12 (CYANOCOBALAMIN) 1000 MCG tablet Take 1,000 mcg by mouth daily.       No current facility-administered medications on file prior to visit.    Past Medical History:  Diagnosis Date  . Arthritis   .  Cancer ([place])    skin on nose .  mylenoma  . Coronary artery disease    status post post LAD angioplasty in 1993  and subsequent coronary artery bypass grafting x1 with a LIMA to the LAD July of 1994. She had stenting of her RCA with a bare-metal stent November 2002.  . Diabetes mellitus    2  . Gout   . Heart murmur   . Hematoma    Right groin  . Hemorrhoids   . Hiatal hernia   . History of blood transfusion   . HOH (hard of hearing)   . Hyperlipidemia   . Hypertension   . Left knee DJD   . Lightheadedness    has seen Dr Gwenlyn Found and ENT.. cant find out why  . Myocardial infarction ([place])   . Osteoporosis   . PONV (postoperative nausea and vomiting)   . Shingles 10/27/2013    Past Surgical History:  Procedure Laterality Date  . CORONARY ANGIOPLASTY  1993   LAD  . CORONARY ANGIOPLASTY WITH STENT PLACEMENT  03/09/01   RCA  . CORONARY ARTERY BYPASS GRAFT  1994  . JOINT REPLACEMENT Right 1998   hip  . NM MYOVIEW LTD  03/2012   Normal  . SKIN GRAFT     to nose skin cancer  . TOTAL HIP ARTHROPLASTY    . TOTAL KNEE ARTHROPLASTY Left 10/09/2013   Procedure: TOTAL KNEE ARTHROPLASTY;  Surgeon: Lorn Junes, MD;  Location: [facility];  Service: Orthopedics;  Laterality: Left;    Social History   Socioeconomic History  . Marital status: Widowed    Spouse name: Not on file  . Number of children: 2  . Years of education: Not on file  . Highest education level: 12th grade  Occupational History  . Occupation: Retired   Tobacco Use  . Smoking status: Never Smoker  . Smokeless tobacco: Never Used  Vaping Use  . Vaping Use: Never used  Substance and Sexual Activity  . Alcohol use: No  . Drug use: No  . Sexual activity: Never  Other Topics Concern  . Not on file  Social History Narrative   Left handed    Caffeine 4 cups daily    Lives at home alone    Social Determinants of Health   Financial Resource Strain: Low Risk   . Difficulty of Paying Living Expenses: Not hard at all   Food Insecurity:   . Worried About Charity fundraiser in the Last Year: Not on file  . Ran Out of Food in the Last Year: Not on file  Transportation Needs:   . Lack of Transportation (Medical): Not on file  . Lack of Transportation (Non-Medical): Not on file  Physical Activity:   . Days of Exercise per Week: Not on file  . Minutes of Exercise per Session: Not on file  Stress:   . Feeling of Stress : Not on file  Social Connections:   . Frequency of Communication with Friends and Family: Not on file  . Frequency of Social Gatherings with Friends and Family: Not on file  . Attends Religious Services: Not on file  . Active Member of Clubs or Organizations: Not on file  . Attends Archivist Meetings: Not on file  . Marital Status: Not on file    Family History  Problem Relation Age of Onset  . Heart disease Father   . Cancer Sister        breast  . Diabetes Sister   . Heart disease Brother   . Heart disease Brother   . Heart disease Brother   . Diabetes Sister   . Stroke Mother     Review of Systems  Constitutional: Negative for chills and fever.  Respiratory: Negative for cough, shortness of breath and wheezing.   Cardiovascular: Positive for leg swelling. Negative for chest pain and palpitations.  Gastrointestinal: Negative for abdominal pain and nausea.  Neurological: Positive for light-headedness. Negative for headaches.       Objective:   Vitals:   01/12/20 0917  BP: (!) 144/82  Pulse: 75  Temp: 97.8 F (36.6 C)  SpO2: 97%   BP Readings from Last 3 Encounters:  01/12/20 (!) 144/82  08/28/19 (!) 170/82  07/05/19 124/63   Wt Readings from Last 3 Encounters:  01/12/20 160 lb (72.6 kg)  08/28/19 161 lb (73 kg)  06/13/19 161 lb 9.6 oz (73.3 kg)   Body mass index is 25.06 kg/m.   Physical Exam    Constitutional: Appears well-developed and well-nourished. No distress.  HENT:  Head: Normocephalic and atraumatic.  Neck: Neck supple. No  tracheal deviation present. No thyromegaly present.  No cervical lymphadenopathy Cardiovascular: Normal rate, regular rhythm and normal heart sounds.   No murmur heard. No carotid bruit .  No edema in right lower extremity.  Mild edema left lower extremity Pulmonary/Chest: Effort normal and breath sounds normal. No respiratory distress. No has no wheezes. No rales.  Skin: Skin is warm and dry. Not diaphoretic.  Psychiatric: Normal mood and affect. Behavior is normal.      Assessment & Plan:    See Problem List for Assessment and Plan of chronic medical problems.    This visit occurred during the SARS-CoV-2 public health emergency.  Safety protocols were in place, including screening questions prior to the visit, additional usage of staff PPE, and extensive cleaning of exam room while observing appropriate contact time as indicated for disinfecting solutions.

## 2020-01-12 ENCOUNTER — Other Ambulatory Visit: Payer: Self-pay

## 2020-01-12 ENCOUNTER — Ambulatory Visit (INDEPENDENT_AMBULATORY_CARE_PROVIDER_SITE_OTHER): Payer: Medicare Other | Admitting: Internal Medicine

## 2020-01-12 ENCOUNTER — Encounter: Payer: Self-pay | Admitting: Internal Medicine

## 2020-01-12 VITALS — BP 144/82 | HR 75 | Temp 97.8°F | Wt 160.0 lb

## 2020-01-12 DIAGNOSIS — I824Y2 Acute embolism and thrombosis of unspecified deep veins of left proximal lower extremity: Secondary | ICD-10-CM

## 2020-01-12 DIAGNOSIS — N289 Disorder of kidney and ureter, unspecified: Secondary | ICD-10-CM | POA: Diagnosis not present

## 2020-01-12 DIAGNOSIS — D649 Anemia, unspecified: Secondary | ICD-10-CM

## 2020-01-12 DIAGNOSIS — E1151 Type 2 diabetes mellitus with diabetic peripheral angiopathy without gangrene: Secondary | ICD-10-CM

## 2020-01-12 DIAGNOSIS — I10 Essential (primary) hypertension: Secondary | ICD-10-CM | POA: Diagnosis not present

## 2020-01-12 DIAGNOSIS — I2510 Atherosclerotic heart disease of native coronary artery without angina pectoris: Secondary | ICD-10-CM

## 2020-01-12 DIAGNOSIS — R413 Other amnesia: Secondary | ICD-10-CM

## 2020-01-12 DIAGNOSIS — I2583 Coronary atherosclerosis due to lipid rich plaque: Secondary | ICD-10-CM

## 2020-01-12 DIAGNOSIS — E039 Hypothyroidism, unspecified: Secondary | ICD-10-CM | POA: Diagnosis not present

## 2020-01-12 DIAGNOSIS — E782 Mixed hyperlipidemia: Secondary | ICD-10-CM | POA: Diagnosis not present

## 2020-01-12 NOTE — Assessment & Plan Note (Signed)
Chronic Lab Results  Component Value Date   HGBA1C 6.6 (H) 08/28/2019   Sugars have been well controlled Continue current medication Recheck A1c

## 2020-01-12 NOTE — Assessment & Plan Note (Signed)
Chronic No concerning symptoms of angina Continue current medications CBC, CMP, lipid panel

## 2020-01-12 NOTE — Assessment & Plan Note (Signed)
Chronic CMP 

## 2020-01-12 NOTE — Assessment & Plan Note (Signed)
Chronic Getting Meals on Wheels Son and daughter very involved Will be getting a home health aide Tuesdays and Thursdays 9-12

## 2020-01-12 NOTE — Assessment & Plan Note (Signed)
Subacute Diagnosed 12/15/2019 and started on Xarelto No obvious cause for DVT Tolerating the medication well without side effects Check CMP, CBC Will plan Xarelto for 3 months-complete mid November

## 2020-01-12 NOTE — Assessment & Plan Note (Signed)
Chronic  Clinically euthyroid Check tsh  Titrate med dose if needed  

## 2020-01-12 NOTE — Assessment & Plan Note (Signed)
Chronic Has been stable, mild CBC

## 2020-01-12 NOTE — Assessment & Plan Note (Signed)
Chronic Blood pressure reasonably controlled for her age Continue current medication

## 2020-01-12 NOTE — Assessment & Plan Note (Signed)
Chronic Check lipid panel  Continue daily statin 

## 2020-01-13 LAB — COMPLETE METABOLIC PANEL WITH GFR
AG Ratio: 1.6 (calc) (ref 1.0–2.5)
ALT: 14 U/L (ref 6–29)
AST: 17 U/L (ref 10–35)
Albumin: 4.4 g/dL (ref 3.6–5.1)
Alkaline phosphatase (APISO): 75 U/L (ref 37–153)
BUN/Creatinine Ratio: 14 (calc) (ref 6–22)
BUN: 16 mg/dL (ref 7–25)
CO2: 28 mmol/L (ref 20–32)
Calcium: 10.7 mg/dL — ABNORMAL HIGH (ref 8.6–10.4)
Chloride: 103 mmol/L (ref 98–110)
Creat: 1.16 mg/dL — ABNORMAL HIGH (ref 0.60–0.88)
GFR, Est African American: 50 mL/min/{1.73_m2} — ABNORMAL LOW (ref >=60)
GFR, Est Non African American: 43 mL/min/{1.73_m2} — ABNORMAL LOW (ref >=60)
Globulin: 2.8 g/dL (calc) (ref 1.9–3.7)
Glucose, Bld: 121 mg/dL — ABNORMAL HIGH (ref 65–99)
Potassium: 3.7 mmol/L (ref 3.5–5.3)
Sodium: 147 mmol/L — ABNORMAL HIGH (ref 135–146)
Total Bilirubin: 0.6 mg/dL (ref 0.2–1.2)
Total Protein: 7.2 g/dL (ref 6.1–8.1)

## 2020-01-13 LAB — CBC WITH DIFFERENTIAL/PLATELET
Absolute Monocytes: 1009 cells/uL — ABNORMAL HIGH (ref 200–950)
Basophils Absolute: 39 cells/uL (ref 0–200)
Basophils Relative: 0.4 %
Eosinophils Absolute: 116 cells/uL (ref 15–500)
Eosinophils Relative: 1.2 %
HCT: 42.1 % (ref 35.0–45.0)
Hemoglobin: 13.9 g/dL (ref 11.7–15.5)
Lymphs Abs: 1892 cells/uL (ref 850–3900)
MCH: 30.3 pg (ref 27.0–33.0)
MCHC: 33 g/dL (ref 32.0–36.0)
MCV: 91.7 fL (ref 80.0–100.0)
MPV: 10.9 fL (ref 7.5–12.5)
Monocytes Relative: 10.4 %
Neutro Abs: 6645 cells/uL (ref 1500–7800)
Neutrophils Relative %: 68.5 %
Platelets: 194 10*3/uL (ref 140–400)
RBC: 4.59 10*6/uL (ref 3.80–5.10)
RDW: 13.8 % (ref 11.0–15.0)
Total Lymphocyte: 19.5 %
WBC: 9.7 10*3/uL (ref 3.8–10.8)

## 2020-01-13 LAB — LIPID PANEL
Cholesterol: 150 mg/dL (ref <200)
HDL: 68 mg/dL (ref >=50)
LDL Cholesterol (Calc): 60 mg/dL (calc)
Non-HDL Cholesterol (Calc): 82 mg/dL (calc) (ref <130)
Total CHOL/HDL Ratio: 2.2 (calc) (ref <5.0)
Triglycerides: 132 mg/dL (ref <150)

## 2020-01-13 LAB — HEMOGLOBIN A1C
Hgb A1c MFr Bld: 6.8 % of total Hgb — ABNORMAL HIGH (ref <5.7)
Mean Plasma Glucose: 148 (calc)
eAG (mmol/L): 8.2 (calc)

## 2020-01-13 LAB — TSH: TSH: 1.63 mIU/L (ref 0.40–4.50)

## 2020-01-18 ENCOUNTER — Encounter: Payer: Self-pay | Admitting: Internal Medicine

## 2020-01-29 ENCOUNTER — Other Ambulatory Visit: Payer: Self-pay

## 2020-01-29 ENCOUNTER — Ambulatory Visit (INDEPENDENT_AMBULATORY_CARE_PROVIDER_SITE_OTHER): Payer: Medicare Other

## 2020-01-29 DIAGNOSIS — Z23 Encounter for immunization: Secondary | ICD-10-CM | POA: Diagnosis not present

## 2020-01-29 DIAGNOSIS — Z0000 Encounter for general adult medical examination without abnormal findings: Secondary | ICD-10-CM | POA: Diagnosis not present

## 2020-01-29 NOTE — Progress Notes (Signed)
Subjective:   Sheila Morgan is a 86 y.o. female who presents for Medicare Annual (Subsequent) preventive examination.  Review of Systems    No ROS. Medicare Wellness Visit. Cardiac Risk Factors include: advanced age (>25men, >65 women);diabetes mellitus;dyslipidemia;family history of premature cardiovascular disease;hypertension     Objective:    Today's Vitals   01/29/20 0939 01/29/20 0940  BP: 130/80   Pulse: 72   Resp: 16   Temp: 97.9 F (36.6 C)   SpO2: 98%   Weight: 157 lb 3.2 oz (71.3 kg)   Height: 5\' 7"  (1.702 m)   PainSc: 0-No pain 0-No pain   Body mass index is 24.62 kg/m.  Advanced Directives 01/29/2020 07/14/2018 07/14/2018 07/06/2018 07/02/2017 12/23/2015 10/09/2013  Does Patient Have a Medical Advance Directive? Yes - No No No Yes Patient does not have advance directive;Patient would like information  Type of Advance Directive Living will;Healthcare Power of Attorney - - - - - -  Does patient want to make changes to medical advance directive? No - Patient declined - - - - - -  Copy of [place] in Chart? No - copy requested - - - - Yes -  Would patient like information on creating a medical advance directive? - No - Patient declined No - Patient declined Yes (ED - Information included in AVS) Yes (ED - Information included in AVS) - -  Pre-existing out of facility DNR order (yellow form or pink MOST form) - - - - - - -    Current Medications (verified) Outpatient Encounter Medications as of 01/29/2020  Medication Sig  . acetaminophen (TYLENOL) 325 MG tablet Take 2 tablets (650 mg total) by mouth every 6 (six) hours as needed for mild pain, fever or headache.  . alendronate (FOSAMAX) 70 MG tablet Take 70 mg by mouth every Saturday. Take with a full glass of water on an empty stomach.   Marland Kitchen amLODipine (NORVASC) 5 MG tablet Take 1.5 tablets (7.5 mg total) by mouth daily.  . Calcium Carbonate-Vitamin D (CALCIUM 600-D PO) Take 1 tablet by mouth 2 (two)  times daily.   . carvedilol (COREG) 25 MG tablet TAKE 1 TABLET(25 MG) BY MOUTH TWICE DAILY WITH A MEAL  . colchicine 0.6 MG tablet TAKE 1 TABLET(0.6 MG) BY MOUTH EVERY OTHER DAY  . diclofenac Sodium (VOLTAREN) 1 % GEL Apply topically.  . furosemide (LASIX) 20 MG tablet Take 2 tablets (40 mg total) by mouth daily.  . isosorbide mononitrate (IMDUR) 30 MG 24 hr tablet TAKE 1 TABLET(30 MG) BY MOUTH DAILY  . levothyroxine (SYNTHROID) 50 MCG tablet TAKE 1 TABLET(50 MCG) BY MOUTH DAILY  . losartan (COZAAR) 100 MG tablet TAKE 1 TABLET(100 MG) BY MOUTH DAILY  . metFORMIN (GLUCOPHAGE) 500 MG tablet TAKE 1/2 TABLET(250 MG) BY MOUTH DAILY WITH LARGEST MEAL  . OneTouch Delica Lancets 05L MISC Use to test blood sugar 2 times daily. Dx Code- E11.51  . ONETOUCH ULTRA test strip USE TO TEST BLOOD SUGAR TWICE DAILY  . predniSONE (DELTASONE) 5 MG tablet Take 5 mg by mouth daily.  . rivaroxaban (XARELTO) 20 MG TABS tablet Take 1 tablet (20 mg total) by mouth daily with supper.  . simvastatin (ZOCOR) 40 MG tablet TAKE 1 TABLET(40 MG) BY MOUTH AT BEDTIME  . vitamin B-12 (CYANOCOBALAMIN) 1000 MCG tablet Take 1,000 mcg by mouth daily.     No facility-administered encounter medications on file as of 01/29/2020.    Allergies (verified) Patient has no known  allergies.   History: Past Medical History:  Diagnosis Date  . Arthritis   . Cancer ([place])    skin on nose .  mylenoma  . Coronary artery disease    status post post LAD angioplasty in 1993  and subsequent coronary artery bypass grafting x1 with a LIMA to the LAD July of 1994. She had stenting of her RCA with a bare-metal stent November 2002.  . Diabetes mellitus    2  . Gout   . Heart murmur   . Hematoma    Right groin  . Hemorrhoids   . Hiatal hernia   . History of blood transfusion   . HOH (hard of hearing)   . Hyperlipidemia   . Hypertension   . Left knee DJD   . Lightheadedness    has seen Dr Gwenlyn Found and ENT.. cant find out why  . Myocardial  infarction ([place]   . Osteoporosis   . PONV (postoperative nausea and vomiting)   . Shingles 10/27/2013   Past Surgical History:  Procedure Laterality Date  . CORONARY ANGIOPLASTY  1993   LAD  . CORONARY ANGIOPLASTY WITH STENT PLACEMENT  03/09/01   RCA  . CORONARY ARTERY BYPASS GRAFT  1994  . JOINT REPLACEMENT Right 1998   hip  . NM MYOVIEW LTD  03/2012   Normal  . SKIN GRAFT     to nose skin cancer  . TOTAL HIP ARTHROPLASTY    . TOTAL KNEE ARTHROPLASTY Left 10/09/2013   Procedure: TOTAL KNEE ARTHROPLASTY;  Surgeon: Lorn Junes, MD;  Location: [facility];  Service: Orthopedics;  Laterality: Left;   Family History  Problem Relation Age of Onset  . Heart disease Father   . Cancer Sister        breast  . Diabetes Sister   . Heart disease Brother   . Heart disease Brother   . Heart disease Brother   . Diabetes Sister   . Stroke Mother    Social History   Socioeconomic History  . Marital status: Widowed    Spouse name: Not on file  . Number of children: 2  . Years of education: Not on file  . Highest education level: 12th grade  Occupational History  . Occupation: Retired   Tobacco Use  . Smoking status: Never Smoker  . Smokeless tobacco: Never Used  Vaping Use  . Vaping Use: Never used  Substance and Sexual Activity  . Alcohol use: No  . Drug use: No  . Sexual activity: Never  Other Topics Concern  . Not on file  Social History Narrative   Left handed    Caffeine 4 cups daily    Lives at home alone    Social Determinants of Health   Financial Resource Strain: Low Risk   . Difficulty of Paying Living Expenses: Not hard at all  Food Insecurity: No Food Insecurity  . Worried About Charity fundraiser in the Last Year: Never true  . Ran Out of Food in the Last Year: Never true  Transportation Needs: No Transportation Needs  . Lack of Transportation (Medical): No  . Lack of Transportation (Non-Medical): No  Physical Activity: Insufficiently Active  . Days of  Exercise per Week: 3 days  . Minutes of Exercise per Session: 20 min  Stress: No Stress Concern Present  . Feeling of Stress : Not at all  Social Connections: Unknown  . Frequency of Communication with Friends and Family: More than three times a week  .  Frequency of Social Gatherings with Friends and Family: More than three times a week  . Attends Religious Services: Patient refused  . Active Member of Clubs or Organizations: Patient refused  . Attends Archivist Meetings: Patient refused  . Marital Status: Patient refused    Tobacco Counseling Counseling given: Not Answered   Clinical Intake:  Pre-visit preparation completed: Yes  Pain : No/denies pain Pain Score: 0-No pain     BMI - recorded: 24.62 Nutritional Status: BMI of 19-24  Normal Nutritional Risks: None Diabetes: Yes CBG done?: No Did pt. bring in CBG monitor from home?: No (Fasting bs 121)  How often do you need to have someone help you when you read instructions, pamphlets, or other written materials from your doctor or pharmacy?: 1 - Never What is the last grade level you completed in school?: HSG  Diabetic? Yes  Interpreter Needed?: No  Information entered by :: Deavion Strider N. Ima Hafner, LPN   Activities of Daily Living In your present state of health, do you have any difficulty performing the following activities: 01/29/2020  Hearing? Y  Comment bilateral hearing loss; wears hearing aids  Vision? N  Difficulty concentrating or making decisions? Y  Walking or climbing stairs? Y  Comment uses a stair lift at home  Dressing or bathing? N  Doing errands, shopping? Y  Comment daugter or son accompanies her to appts, grocery store etc.  Conservation officer, nature and eating ? N  Using the Toilet? N  In the past six months, have you accidently leaked urine? N  Do you have problems with loss of bowel control? N  Managing your Medications? Y  Managing your Finances? Y  Housekeeping or managing your  Housekeeping? Y  Some recent data might be hidden    Patient Care Team: Binnie Rail, MD as PCP - General (Internal Medicine) Garald Balding, MD as Consulting Physician (Orthopedic Surgery) Noe Gens as Physician Assistant (Orthopedic Surgery) Marzetta Board, DPM as Consulting Physician (Podiatry) Leroux-Martinez, Nancy Marus, AUD (Audiology) Kathrynn Ducking, MD as Consulting Physician (Neurology) Charlton Haws, [facility] as Pharmacist (Pharmacist)  Indicate any recent Medical Services you may have received from other than [facility] providers in the past year (date may be approximate).     Assessment:   This is a routine wellness examination for Bonnita.  Hearing/Vision screen No exam data present  Dietary issues and exercise activities discussed: Current Exercise Habits: Home exercise routine, Type of exercise: walking (started walking with CNA on Tuesday and Thursday), Time (Minutes): 20, Frequency (Times/Week): 4, Weekly Exercise (Minutes/Week): 80, Intensity: Mild, Exercise limited by: None identified  Goals    . patient     Exercise; discussed weight bearing; walking and using small weights to strengthen arms Walk, walk, walk      . Patient Stated     Set out 2-3 bottles of water daily, put it by my TV chair to remind me to drink. Stay as healthy and as independent as possible    . Patient Stated     Maintain current health status.    . Pharmacy Care Plan     CARE PLAN ENTRY (see longitudinal plan of care for additional care plan information)  Current Barriers:  . Chronic Disease Management support, education, and care coordination needs related to Hypertension, Hyperlipidemia, Diabetes, and Coronary Artery Disease   Hypertension BP Readings from Last 3 Encounters:  08/28/19 (!) 170/82  07/05/19 124/63  06/13/19 (!) 179/79 .  Pharmacist Clinical  Goal(s): o Over the next 90 days, patient will work with PharmD and providers to achieve BP  goal <140/90 . Current regimen:  o Amlodipine 5 mg - 1.5 tablets daily o Carvedilol 25 mg twice a day o Furosemide 20 mg daily o Isosorbide MN 30 mg daily o Losartan 100 mg daily  . Interventions: o Discussed BP goals and benefits of medication for prevention of heart attack / stroke . Patient self care activities - Over the next 90 days, patient will: o Take medications as prescribed o Check BP daily, document, and provide at future appointments o Ensure daily salt intake < 2300 mg/day  Hyperlipidemia / CAD Lab Results  Component Value Date/Time   LDLCALC 71 08/28/2019 09:49 AM   LDLCALC 91 06/17/2017 08:38 AM .  Pharmacist Clinical Goal(s): o Over the next 90 days, patient will work with PharmD and providers to achieve LDL goal < 70 . Current regimen:  . Simvastatin 40 mg daily at bedtime . Isosorbide MN 30 mg daily . Interventions: o Discussed cholesterol goals and benefits of medication for prevention of heart attack / stroke o Recommend to continue aspirin 81 mg due to history of heart surgery . Patient self care activities - Over the next 90 days, patient will: o Continue current medications including aspirin 81 mg daily  Diabetes Lab Results  Component Value Date/Time   HGBA1C 6.6 (H) 08/28/2019 09:49 AM   HGBA1C 7.0 (H) 01/11/2019 09:37 AM .  Pharmacist Clinical Goal(s): o Over the next 90 days, patient will work with PharmD and providers to maintain A1c goal <7% . Current regimen:  o Metformin 500 mg - 1/2 tablet once daily . Interventions: o Discussed A1c and blood sugar goals and benefits of medication for prevention of diabetic complications . Patient self care activities - Over the next 90 days, patient will: o Check blood sugar once daily and in the morning before eating or drinking, document, and provide at future appointments o Contact provider with any episodes of hypoglycemia  Medication management . Pharmacist Clinical Goal(s): o Over the next 90  days, patient will work with PharmD and providers to maintain optimal medication adherence . Current pharmacy: [facility] . Interventions o Comprehensive medication review performed. o Continue current medication management strategy . Patient self care activities - Over the next 90 days, patient will: o Focus on medication adherence by pill box o Take medications as prescribed o Report any questions or concerns to PharmD and/or provider(s)  Initial goal documentation      Depression Screen PHQ 2/9 Scores 01/29/2020 08/28/2019 07/06/2018 07/02/2017 01/19/2017 12/23/2015 03/13/2015  PHQ - 2 Score 0 0 0 0 0 0 0  PHQ- 9 Score - - - 5 - - -    Fall Risk Fall Risk  01/29/2020 08/28/2019 07/06/2018 06/09/2018 07/02/2017  Falls in the past year? 0 0 0 0 Yes  Number falls in past yr: 0 0 0 0 1  Injury with Fall? 0 0 - 0 Yes  Comment - - - - -  Risk Factor Category  - - - - -  Risk for fall due to : No Fall Risks No Fall Risks;History of fall(s) Impaired balance/gait;Impaired mobility;History of fall(s) - Impaired balance/gait;Impaired mobility  Follow up Falls evaluation completed Falls evaluation completed Falls prevention discussed;Education provided - Falls prevention discussed;Education provided    Any stairs in or around the home? Yes  If so, are there any without handrails? No  Home free of loose throw rugs in walkways,  pet beds, electrical cords, etc? Yes  Adequate lighting in your home to reduce risk of falls? Yes   ASSISTIVE DEVICES UTILIZED TO PREVENT FALLS:  Life alert? Yes  Use of a cane, walker or w/c? Yes  Grab bars in the bathroom? Yes  Shower chair or bench in shower? Yes  Elevated toilet seat or a handicapped toilet? Yes   TIMED UP AND GO:  Was the test performed? No .  Length of time to ambulate 10 feet: 0 sec.   Gait steady and fast with assistive device  Cognitive Function: MMSE - Mini Mental State Exam 06/13/2019 12/08/2018 07/06/2018 06/09/2018 07/02/2017  Not completed: (No  Data) - (No Data) - -  Orientation to time 5 4 - 5 4  Orientation to Place 3 4 - 4 5  Registration 3 3 - 3 3  Attention/ Calculation 0 2 - 5 3  Recall 1 2 - 2 1  Language- name 2 objects 2 2 - 2 2  Language- repeat 0 1 - 0 1  Language- follow 3 step command 3 3 - 2 3  Language- read & follow direction 1 1 - 1 1  Write a sentence 1 1 - 1 1  Copy design 0 1 - 0 1  Copy design-comments - 7 animals - - -  Total score 19 24 - 25 25     6CIT Screen 01/29/2020  What Year? 0 points  What month? 0 points  What time? 3 points  Count back from 20 0 points  Months in reverse 0 points  Repeat phrase 2 points  Total Score 5    Immunizations Immunization History  Administered Date(s) Administered  . Fluad Quad(high Dose 65+) 01/11/2019  . Influenza Whole 03/08/2007, 02/13/2008, 02/15/2009, 12/31/2009  . Influenza, High Dose Seasonal PF 02/12/2014, 01/05/2018  . Influenza,inj,Quad PF,6+ Mos 03/13/2015, 12/23/2015  . Influenza-Unspecified 02/04/2013, 02/17/2017  . PFIZER SARS-COV-2 Vaccination 06/25/2019, 12/22/2019  . Pneumococcal Conjugate-13 06/13/2015  . Pneumococcal Polysaccharide-23 03/29/2013  . Td 01/07/2010    TDAP status: Due, Education has been provided regarding the importance of this vaccine. Advised may receive this vaccine at local pharmacy or Health Dept. Aware to provide a copy of the vaccination record if obtained from local pharmacy or Health Dept. Verbalized acceptance and understanding. Flu Vaccine status: Up to date Pneumococcal vaccine status: Up to date Covid-19 vaccine status: Completed vaccines  Qualifies for Shingles Vaccine? Yes   Zostavax completed No   Shingrix Completed?: No.    Education has been provided regarding the importance of this vaccine. Patient has been advised to call insurance company to determine out of pocket expense if they have not yet received this vaccine. Advised may also receive vaccine at local pharmacy or Health Dept. Verbalized  acceptance and understanding.  Screening Tests Health Maintenance  Topic Date Due  . INFLUENZA VACCINE  12/03/2019  . TETANUS/TDAP  01/11/2021 (Originally 01/08/2020)  . DEXA SCAN  06/01/2020  . HEMOGLOBIN A1C  07/11/2020  . OPHTHALMOLOGY EXAM  10/09/2020  . FOOT EXAM  12/21/2020  . COVID-19 Vaccine  Completed  . PNA vac Low Risk Adult  Completed    Health Maintenance  Health Maintenance Due  Topic Date Due  . INFLUENZA VACCINE  12/03/2019    Colorectal cancer screening: No longer required.  Mammogram status: No longer required.  Bone Density status: Completed 06/01/2018. Results reflect: Bone density results: OSTEOPOROSIS. Repeat every 2 years.  Lung Cancer Screening: (Low Dose CT Chest recommended if Age 63-80  years, 30 pack-year currently smoking OR have quit w/in 15years.) does not qualify.   Lung Cancer Screening Referral: no  Additional Screening:  Hepatitis C Screening: does not qualify; Completed No  Vision Screening: Recommended annual ophthalmology exams for early detection of glaucoma and other disorders of the eye. Is the patient up to date with their annual eye exam?  Yes  Who is the provider or what is the name of the office in which the patient attends annual eye exams? [facility] If pt is not established with a provider, would they like to be referred to a provider to establish care? No .   Dental Screening: Recommended annual dental exams for proper oral hygiene  Community Resource Referral / Chronic Care Management: CRR required this visit?  No   CCM required this visit?  No      Plan:     I have personally reviewed and noted the following in the patient's chart:   . Medical and social history . Use of alcohol, tobacco or illicit drugs  . Current medications and supplements . Functional ability and status . Nutritional status . Physical activity . Advanced directives . List of other physicians . Hospitalizations, surgeries, and  ER visits in previous 12 months . Vitals . Screenings to include cognitive, depression, and falls . Referrals and appointments  In addition, I have reviewed and discussed with patient certain preventive protocols, quality metrics, and best practice recommendations. A written personalized care plan for preventive services as well as general preventive health recommendations were provided to patient.     Sheral Flow, LPN   [id number]   Nurse Notes: n/a

## 2020-01-29 NOTE — Patient Instructions (Signed)
Sheila Morgan , Thank you for taking time to come for your Medicare Wellness Visit. I appreciate your ongoing commitment to your health goals. Please review the following plan we discussed and let me know if I can assist you in the future.   Screening recommendations/referrals: Colonoscopy: no longer a candidate for colon cancer screening due to age 86: no longer required due to age Bone Density: 06/01/2018; due every 2 years Recommended yearly ophthalmology/optometry visit for glaucoma screening and checkup Recommended yearly dental visit for hygiene and checkup  Vaccinations: Influenza vaccine: 01/29/2020 Pneumococcal vaccine: completed Tdap vaccine: postponed until 01/11/2021 Shingles vaccine: never done Covid-19: completed [employer])  Advanced directives: Please bring a copy of your health care power of attorney and living will to the office at your convenience.  Conditions/risks identified: Yes. Reviewed health maintenance screenings with patient today and relevant education, vaccines, and/or referrals were provided. Continue doing brain stimulating activities (puzzles, reading, adult coloring books, staying active) to keep memory sharp. Continue to eat heart healthy diet (full of fruits, vegetables, whole grains, lean protein, water--limit salt, fat, and sugar intake) and increase physical activity as tolerated.  Next appointment: Please schedule your next Medicare Wellness Visit with your Nurse Health Advisor in 1 year by calling [phone].  Preventive Care 48 Years and Older, Female Preventive care refers to lifestyle choices and visits with your health care provider that can promote health and wellness. What does preventive care include?  A yearly physical exam. This is also called an annual well check.  Dental exams once or twice a year.  Routine eye exams. Ask your health care provider how often you should have your eyes checked.  Personal lifestyle choices,  including:  Daily care of your teeth and gums.  Regular physical activity.  Eating a healthy diet.  Avoiding tobacco and drug use.  Limiting alcohol use.  Practicing safe sex.  Taking low-dose aspirin every day.  Taking vitamin and mineral supplements as recommended by your health care provider. What happens during an annual well check? The services and screenings done by your health care provider during your annual well check will depend on your age, overall health, lifestyle risk factors, and family history of disease. Counseling  Your health care provider may ask you questions about your:  Alcohol use.  Tobacco use.  Drug use.  Emotional well-being.  Home and relationship well-being.  Sexual activity.  Eating habits.  History of falls.  Memory and ability to understand (cognition).  Work and work Statistician.  Reproductive health. Screening  You may have the following tests or measurements:  Height, weight, and BMI.  Blood pressure.  Lipid and cholesterol levels. These may be checked every 5 years, or more frequently if you are over 16 years old.  Skin check.  Lung cancer screening. You may have this screening every year starting at age 35 if you have a 30-pack-year history of smoking and currently smoke or have quit within the past 15 years.  Fecal occult blood test (FOBT) of the stool. You may have this test every year starting at age 22.  Flexible sigmoidoscopy or colonoscopy. You may have a sigmoidoscopy every 5 years or a colonoscopy every 10 years starting at age 84.  Hepatitis C blood test.  Hepatitis B blood test.  Sexually transmitted disease (STD) testing.  Diabetes screening. This is done by checking your blood sugar (glucose) after you have not eaten for a while (fasting). You may have this done every 1-3 years.  Bone density  scan. This is done to screen for osteoporosis. You may have this done starting at age 35.  Mammogram. This  may be done every 1-2 years. Talk to your health care provider about how often you should have regular mammograms. Talk with your health care provider about your test results, treatment options, and if necessary, the need for more tests. Vaccines  Your health care provider may recommend certain vaccines, such as:  Influenza vaccine. This is recommended every year.  Tetanus, diphtheria, and acellular pertussis (Tdap, Td) vaccine. You may need a Td booster every 10 years.  Zoster vaccine. You may need this after age 60.  Pneumococcal 13-valent conjugate (PCV13) vaccine. One dose is recommended after age 71.  Pneumococcal polysaccharide (PPSV23) vaccine. One dose is recommended after age 57. Talk to your health care provider about which screenings and vaccines you need and how often you need them. This information is not intended to replace advice given to you by your health care provider. Make sure you discuss any questions you have with your health care provider. Document Released: 05/17/2015 Document Revised: 01/08/2016 Document Reviewed: 02/19/2015 Elsevier Interactive Patient Education  2017 [place] Prevention in the Home Falls can cause injuries. They can happen to people of all ages. There are many things you can do to make your home safe and to help prevent falls. What can I do on the outside of my home?  Regularly fix the edges of walkways and driveways and fix any cracks.  Remove anything that might make you trip as you walk through a door, such as a raised step or threshold.  Trim any bushes or trees on the path to your home.  Use bright outdoor lighting.  Clear any walking paths of anything that might make someone trip, such as rocks or tools.  Regularly check to see if handrails are loose or broken. Make sure that both sides of any steps have handrails.  Any raised decks and porches should have guardrails on the edges.  Have any leaves, snow, or ice cleared  regularly.  Use sand or salt on walking paths during winter.  Clean up any spills in your garage right away. This includes oil or grease spills. What can I do in the bathroom?  Use night lights.  Install grab bars by the toilet and in the tub and shower. Do not use towel bars as grab bars.  Use non-skid mats or decals in the tub or shower.  If you need to sit down in the shower, use a plastic, non-slip stool.  Keep the floor dry. Clean up any water that spills on the floor as soon as it happens.  Remove soap buildup in the tub or shower regularly.  Attach bath mats securely with double-sided non-slip rug tape.  Do not have throw rugs and other things on the floor that can make you trip. What can I do in the bedroom?  Use night lights.  Make sure that you have a light by your bed that is easy to reach.  Do not use any sheets or blankets that are too big for your bed. They should not hang down onto the floor.  Have a firm chair that has side arms. You can use this for support while you get dressed.  Do not have throw rugs and other things on the floor that can make you trip. What can I do in the kitchen?  Clean up any spills right away.  Avoid walking on wet  floors.  Keep items that you use a lot in easy-to-reach places.  If you need to reach something above you, use a strong step stool that has a grab bar.  Keep electrical cords out of the way.  Do not use floor polish or wax that makes floors slippery. If you must use wax, use non-skid floor wax.  Do not have throw rugs and other things on the floor that can make you trip. What can I do with my stairs?  Do not leave any items on the stairs.  Make sure that there are handrails on both sides of the stairs and use them. Fix handrails that are broken or loose. Make sure that handrails are as long as the stairways.  Check any carpeting to make sure that it is firmly attached to the stairs. Fix any carpet that is loose  or worn.  Avoid having throw rugs at the top or bottom of the stairs. If you do have throw rugs, attach them to the floor with carpet tape.  Make sure that you have a light switch at the top of the stairs and the bottom of the stairs. If you do not have them, ask someone to add them for you. What else can I do to help prevent falls?  Wear shoes that:  Do not have high heels.  Have rubber bottoms.  Are comfortable and fit you well.  Are closed at the toe. Do not wear sandals.  If you use a stepladder:  Make sure that it is fully opened. Do not climb a closed stepladder.  Make sure that both sides of the stepladder are locked into place.  Ask someone to hold it for you, if possible.  Clearly mark and make sure that you can see:  Any grab bars or handrails.  First and last steps.  Where the edge of each step is.  Use tools that help you move around (mobility aids) if they are needed. These include:  Canes.  Walkers.  Scooters.  Crutches.  Turn on the lights when you go into a dark area. Replace any light bulbs as soon as they burn out.  Set up your furniture so you have a clear path. Avoid moving your furniture around.  If any of your floors are uneven, fix them.  If there are any pets around you, be aware of where they are.  Review your medicines with your doctor. Some medicines can make you feel dizzy. This can increase your chance of falling. Ask your doctor what other things that you can do to help prevent falls. This information is not intended to replace advice given to you by your health care provider. Make sure you discuss any questions you have with your health care provider. Document Released: 02/14/2009 Document Revised: 09/26/2015 Document Reviewed: 05/25/2014 Elsevier Interactive Patient Education  2017 [employer].

## 2020-02-12 ENCOUNTER — Encounter: Payer: Self-pay | Admitting: Neurology

## 2020-02-12 ENCOUNTER — Ambulatory Visit (INDEPENDENT_AMBULATORY_CARE_PROVIDER_SITE_OTHER): Payer: Medicare Other | Admitting: Neurology

## 2020-02-12 ENCOUNTER — Other Ambulatory Visit: Payer: Self-pay

## 2020-02-12 VITALS — BP 180/88 | HR 78 | Ht 67.0 in | Wt 157.9 lb

## 2020-02-12 DIAGNOSIS — I739 Peripheral vascular disease, unspecified: Secondary | ICD-10-CM

## 2020-02-12 DIAGNOSIS — R413 Other amnesia: Secondary | ICD-10-CM | POA: Diagnosis not present

## 2020-02-12 NOTE — Patient Instructions (Signed)
Memory score was 22/30 today  Recommend no longer driving  Likely will need increased supervision overtime  Continue follow-up with primary doctor Return here as needed

## 2020-02-12 NOTE — Progress Notes (Signed)
PATIENT: YAMILA CRAGIN DOB: [birth year]  REASON FOR VISIT: follow up HISTORY FROM: patient  HISTORY OF PRESENT ILLNESS: Today 02/12/20 Ms. Corwin is an 86 year old female with history of memory disturbance.  CT scan of the brain February 2020 showed moderate level SVD, likely affecting her dizziness, balance, possibly memory.  She lives alone, but her son check on her twice a day, she has someone coming 2 days a week for 3 hour blocks, gets Meals on Wheels.  Does her own ADLs, no falls, uses a cane.  Has arthritis, hard to get up.  Had a blood clot, on Xarelto until mid November.  Has not wanted to be on memory medicines.  Feels memory is overall stable.  Presents today accompanied by her daughter, [name]. BP elevated today, did not take medications, is hard of hearing, has life alert.  No significant dizziness or balance issues reported of recent. Was referred to ST at last visit, per family request, she didn't go.  HISTORY 06/13/2019 SS: Ms. Heuerman is an 86 year old female with history of memory disturbance.  CT scan of the brain in February 2020 showed moderate level small vessel disease, likely impacting her dizziness, balance, and possibly memory, but no real change from prior study in 2017.  She has not wish to start memory medications.  She continues to live alone.  She feels her memory is stable.  She has not had any falls.  She is able to perform her own ADLs.  She has family close by.  Her daughter handles her finances and medications.  She reports she has a good appetite.  She continues to drive a car short distances.  There have been some episodes of her getting lost going to her daughter's house.  She does wear hearing aids, but not all the time, sometimes her family wonders if her hearing impacts her memory.  She uses a cane for ambulation when she goes out.  She has difficulty organizing her thoughts, and some word finding.  She has a life alert.  She denies any new problems or  concerns.  She presents today for evaluation accompanied by her granddaughter   REVIEW OF SYSTEMS: Out of a complete 14 system review of symptoms, the patient complains only of the following symptoms, and all other reviewed systems are negative.  Memory loss  ALLERGIES: No Known Allergies  HOME MEDICATIONS: Outpatient Medications Prior to Visit  Medication Sig Dispense Refill  . acetaminophen (TYLENOL) 325 MG tablet Take 2 tablets (650 mg total) by mouth every 6 (six) hours as needed for mild pain, fever or headache.    . alendronate (FOSAMAX) 70 MG tablet Take 70 mg by mouth every Saturday. Take with a full glass of water on an empty stomach.     Marland Kitchen amLODipine (NORVASC) 5 MG tablet Take 1.5 tablets (7.5 mg total) by mouth daily. 135 tablet 1  . Calcium Carbonate-Vitamin D (CALCIUM 600-D PO) Take 1 tablet by mouth 2 (two) times daily.     . carvedilol (COREG) 25 MG tablet TAKE 1 TABLET(25 MG) BY MOUTH TWICE DAILY WITH A MEAL 180 tablet 1  . colchicine 0.6 MG tablet TAKE 1 TABLET(0.6 MG) BY MOUTH EVERY OTHER DAY 45 tablet 0  . diclofenac Sodium (VOLTAREN) 1 % GEL Apply topically.    . furosemide (LASIX) 20 MG tablet Take 2 tablets (40 mg total) by mouth daily. 180 tablet 0  . isosorbide mononitrate (IMDUR) 30 MG 24 hr tablet TAKE 1 TABLET(30 MG) BY  MOUTH DAILY 90 tablet 1  . levothyroxine (SYNTHROID) 50 MCG tablet TAKE 1 TABLET(50 MCG) BY MOUTH DAILY 90 tablet 1  . losartan (COZAAR) 100 MG tablet TAKE 1 TABLET(100 MG) BY MOUTH DAILY 90 tablet 1  . metFORMIN (GLUCOPHAGE) 500 MG tablet TAKE 1/2 TABLET(250 MG) BY MOUTH DAILY WITH LARGEST MEAL 45 tablet 2  . OneTouch Delica Lancets 71G MISC Use to test blood sugar 2 times daily. Dx Code- E11.51 200 each 1  . ONETOUCH ULTRA test strip USE TO TEST BLOOD SUGAR TWICE DAILY 200 strip 3  . predniSONE (DELTASONE) 5 MG tablet Take 5 mg by mouth daily.    . rivaroxaban (XARELTO) 20 MG TABS tablet Take 1 tablet (20 mg total) by mouth daily with supper. 30  tablet 2  . simvastatin (ZOCOR) 40 MG tablet TAKE 1 TABLET(40 MG) BY MOUTH AT BEDTIME 90 tablet 1  . vitamin B-12 (CYANOCOBALAMIN) 1000 MCG tablet Take 1,000 mcg by mouth daily.       No facility-administered medications prior to visit.    PAST MEDICAL HISTORY: Past Medical History:  Diagnosis Date  . Arthritis   . Cancer ([place])    skin on nose .  mylenoma  . Coronary artery disease    status post post LAD angioplasty in 1993  and subsequent coronary artery bypass grafting x1 with a LIMA to the LAD July of 1994. She had stenting of her RCA with a bare-metal stent November 2002.  . Diabetes mellitus    2  . Gout   . Heart murmur   . Hematoma    Right groin  . Hemorrhoids   . Hiatal hernia   . History of blood transfusion   . HOH (hard of hearing)   . Hyperlipidemia   . Hypertension   . Left knee DJD   . Lightheadedness    has seen Dr Gwenlyn Found and ENT.. cant find out why  . Myocardial infarction ([place])   . Osteoporosis   . PONV (postoperative nausea and vomiting)   . Shingles 10/27/2013    PAST SURGICAL HISTORY: Past Surgical History:  Procedure Laterality Date  . CORONARY ANGIOPLASTY  1993   LAD  . CORONARY ANGIOPLASTY WITH STENT PLACEMENT  03/09/01   RCA  . CORONARY ARTERY BYPASS GRAFT  1994  . JOINT REPLACEMENT Right 1998   hip  . NM MYOVIEW LTD  03/2012   Normal  . SKIN GRAFT     to nose skin cancer  . TOTAL HIP ARTHROPLASTY    . TOTAL KNEE ARTHROPLASTY Left 10/09/2013   Procedure: TOTAL KNEE ARTHROPLASTY;  Surgeon: Lorn Junes, MD;  Location: [facility];  Service: Orthopedics;  Laterality: Left;    FAMILY HISTORY: Family History  Problem Relation Age of Onset  . Heart disease Father   . Cancer Sister        breast  . Diabetes Sister   . Heart disease Brother   . Heart disease Brother   . Heart disease Brother   . Diabetes Sister   . Stroke Mother     SOCIAL HISTORY: Social History   Socioeconomic History  . Marital status: Widowed    Spouse name: Not  on file  . Number of children: 2  . Years of education: Not on file  . Highest education level: 12th grade  Occupational History  . Occupation: Retired   Tobacco Use  . Smoking status: Never Smoker  . Smokeless tobacco: Never Used  Vaping Use  . Vaping Use:  Never used  Substance and Sexual Activity  . Alcohol use: No  . Drug use: No  . Sexual activity: Never  Other Topics Concern  . Not on file  Social History Narrative   Left handed    Caffeine 4 cups daily    Lives at home alone    Social Determinants of Health   Financial Resource Strain: Low Risk   . Difficulty of Paying Living Expenses: Not hard at all  Food Insecurity: No Food Insecurity  . Worried About Charity fundraiser in the Last Year: Never true  . Ran Out of Food in the Last Year: Never true  Transportation Needs: No Transportation Needs  . Lack of Transportation (Medical): No  . Lack of Transportation (Non-Medical): No  Physical Activity: Insufficiently Active  . Days of Exercise per Week: 3 days  . Minutes of Exercise per Session: 20 min  Stress: No Stress Concern Present  . Feeling of Stress : Not at all  Social Connections: Unknown  . Frequency of Communication with Friends and Family: More than three times a week  . Frequency of Social Gatherings with Friends and Family: More than three times a week  . Attends Religious Services: Patient refused  . Active Member of Clubs or Organizations: Patient refused  . Attends Archivist Meetings: Patient refused  . Marital Status: Patient refused  Intimate Partner Violence:   . Fear of Current or Ex-Partner: Not on file  . Emotionally Abused: Not on file  . Physically Abused: Not on file  . Sexually Abused: Not on file   PHYSICAL EXAM  Vitals:   02/12/20 1002  BP: (!) 180/88  Pulse: 78  Weight: 157 lb 14.4 oz (71.6 kg)  Height: 5\' 7"  (1.702 m)   Body mass index is 24.73 kg/m.  Generalized: Well developed, in no acute distress  MMSE -  Mini Mental State Exam 02/12/2020 06/13/2019 12/08/2018  Not completed: - (No Data) -  Orientation to time 4 5 4   Orientation to Place 4 3 4   Registration 3 3 3   Attention/ Calculation 0 0 2  Recall 3 1 2   Language- name 2 objects 2 2 2   Language- repeat 1 0 1  Language- follow 3 step command 3 3 3   Language- read & follow direction 1 1 1   Write a sentence 1 1 1   Copy design 0 0 1  Copy design-comments - - 7 animals  Total score 22 19 24     Neurological examination  Mentation: Alert, oriented, did think her daughter was her mother, history is equally provided by the patient and her daughter, is hard of hearing. Follows all commands speech and language fluent Cranial nerve II-XII: Pupils were equal round reactive to light. Extraocular movements were full, visual field were full on confrontational test. Facial sensation and strength were normal. Head turning and shoulder shrug were normal and symmetric. Motor: Good strength all extremities Sensory: Sensory testing is intact to soft touch on all 4 extremities. No evidence of extinction is noted.  Coordination: Cerebellar testing reveals good finger-nose-finger and heel-to-shin bilaterally.  Gait and station: Has to rock to stand, slow to rise, gait is wide-based, cautious, uses single-point cane  DIAGNOSTIC DATA (LABS, IMAGING, TESTING) - I reviewed patient records, labs, notes, testing and imaging myself where available.  Lab Results  Component Value Date   WBC 9.7 01/12/2020   HGB 13.9 01/12/2020   HCT 42.1 01/12/2020   MCV 91.7 01/12/2020   PLT 194  01/12/2020      Component Value Date/Time   NA 147 (H) 01/12/2020 1011   K 3.7 01/12/2020 1011   CL 103 01/12/2020 1011   CO2 28 01/12/2020 1011   GLUCOSE 121 (H) 01/12/2020 1011   BUN 16 01/12/2020 1011   CREATININE 1.16 (H) 01/12/2020 1011   CALCIUM 10.7 (H) 01/12/2020 1011   PROT 7.2 01/12/2020 1011   PROT 7.2 06/17/2017 0838   ALBUMIN 4.2 08/28/2019 0949   ALBUMIN 4.5  06/17/2017 0838   AST 17 01/12/2020 1011   ALT 14 01/12/2020 1011   ALKPHOS 64 08/28/2019 0949   BILITOT 0.6 01/12/2020 1011   BILITOT 0.6 06/17/2017 0838   GFRNONAA 43 (L) 01/12/2020 1011   GFRAA 50 (L) 01/12/2020 1011   Lab Results  Component Value Date   CHOL 150 01/12/2020   HDL 68 01/12/2020   LDLCALC 60 01/12/2020   TRIG 132 01/12/2020   CHOLHDL 2.2 01/12/2020   Lab Results  Component Value Date   HGBA1C 6.8 (H) 01/12/2020   Lab Results  Component Value Date   VITAMINB12 >2000 (H) 06/09/2018   Lab Results  Component Value Date   TSH 1.63 01/12/2020    ASSESSMENT AND PLAN 86 y.o. year old female  has a past medical history of Arthritis, Cancer ([place]), Coronary artery disease, Diabetes mellitus, Gout, Heart murmur, Hematoma, Hemorrhoids, Hiatal hernia, History of blood transfusion, HOH (hard of hearing), Hyperlipidemia, Hypertension, Left knee DJD, Lightheadedness, Myocardial infarction ([place]), Osteoporosis, PONV (postoperative nausea and vomiting), and Shingles (10/27/2013). here with:  1.  Memory disturbance 2.  Small vessel disease by CT scan February 2020 3.  Gait instability  -Memory overall stable, MMSE 22/30 -Is not interested in starting memory medications  -Recommend she no longer drive at this point -Likely will require more supervision going forward -Make sure to use a cane for safe ambulation to prevent falls -Continue follow-up with PCP, return here as needed  I spent 20 minutes of face-to-face and non-face-to-face time with patient.  This included previsit chart review, lab review, study review, order entry, electronic health record documentation, patient education.  Butler Denmark, AGNP-C, DNP 02/12/2020, 10:31 AM [facility] [address], [place], [place] [address] [phone]

## 2020-02-12 NOTE — Progress Notes (Signed)
I have read the note, and I agree with the clinical assessment and plan.  Shalissa Easterwood K Nashla Althoff   

## 2020-02-27 ENCOUNTER — Ambulatory Visit: Payer: Medicare Other | Admitting: Internal Medicine

## 2020-03-05 ENCOUNTER — Telehealth: Payer: Medicare Other

## 2020-03-05 ENCOUNTER — Other Ambulatory Visit: Payer: Self-pay | Admitting: Internal Medicine

## 2020-03-05 NOTE — Chronic Care Management (AMB) (Deleted)
Chronic Care Management Pharmacy  Name: Sheila Morgan  MRN: [id number] DOB: 11/18/1932   Chief Complaint/ HPI  Sheila Morgan,  86 y.o. , female presents for their Follow-Up CCM visit with the clinical pharmacist via telephone due to COVID-19 Pandemic.  PCP : Binnie Rail, MD Patient Care Team: Binnie Rail, MD as PCP - General (Internal Medicine) Garald Balding, MD as Consulting Physician (Orthopedic Surgery) Baldwin Jamaica, Lonzo Cloud as Physician Assistant (Orthopedic Surgery) Marzetta Board, DPM as Consulting Physician (Podiatry) Leroux-Martinez, Nancy Marus, AUD (Audiology) Kathrynn Ducking, MD as Consulting Physician (Neurology) Charlton Haws, [facility] as Pharmacist (Pharmacist)  Their chronic conditions include: Hypertension, Hyperlipidemia, Diabetes, Coronary Artery Disease, Hypothyroidism, Osteoporosis, Osteoarthritis and Gout   Spoke with patient's daughter [name] who helps with medications, although for the past several weeks she has been recovering from brain surgery herself and her brother has mostly taken over. Patient lives alone currently, right now patient's son is coming over twice a day to help with medications and daily activities, and [employer] delivers once a day. Patient's children set up her pill box each week. Patient takes her own blood pressure and blood sugar each morning and writes them down.   Office Visits: 01/12/20 Dr Quay Burow OV: chronic f/u. No obvious cause for DVT. Plan for 3 months of Xarelto - complete mid-November  12/15/19 Dr Sharlet Salina VV: DVT found on [place] at rheumatology. Rx Xarelto 20 mg, increase lasix 40 mg x 2 weeks then return to 20 mg dose.  08/28/19 Dr Quay Burow OV: memory issues stable, chronic conditions stable, no med changes  Consult Visit: 02/12/20 NP Butler Denmark (neurology): f/u for memory difficulties. MMSE 22/30, not interested in memory medications. Recommend she no longer drive. May require more supervision. Use  a cane to prevent falls.  09/19/19 Acquanetta Sit DPM (podiatry): no new findings. DM foot care.  No Known Allergies  Medications: Outpatient Encounter Medications as of 03/05/2020  Medication Sig   acetaminophen (TYLENOL) 325 MG tablet Take 2 tablets (650 mg total) by mouth every 6 (six) hours as needed for mild pain, fever or headache.   alendronate (FOSAMAX) 70 MG tablet Take 70 mg by mouth every Saturday. Take with a full glass of water on an empty stomach.    amLODipine (NORVASC) 5 MG tablet Take 1.5 tablets (7.5 mg total) by mouth daily.   Calcium Carbonate-Vitamin D (CALCIUM 600-D PO) Take 1 tablet by mouth 2 (two) times daily.    carvedilol (COREG) 25 MG tablet TAKE 1 TABLET(25 MG) BY MOUTH TWICE DAILY WITH A MEAL   colchicine 0.6 MG tablet TAKE 1 TABLET(0.6 MG) BY MOUTH EVERY OTHER DAY   diclofenac Sodium (VOLTAREN) 1 % GEL Apply topically.   furosemide (LASIX) 20 MG tablet Take 2 tablets (40 mg total) by mouth daily.   isosorbide mononitrate (IMDUR) 30 MG 24 hr tablet TAKE 1 TABLET(30 MG) BY MOUTH DAILY   levothyroxine (SYNTHROID) 50 MCG tablet TAKE 1 TABLET(50 MCG) BY MOUTH DAILY   losartan (COZAAR) 100 MG tablet TAKE 1 TABLET(100 MG) BY MOUTH DAILY   metFORMIN (GLUCOPHAGE) 500 MG tablet TAKE 1/2 TABLET(250 MG) BY MOUTH DAILY WITH LARGEST MEAL   OneTouch Delica Lancets 13Y MISC Use to test blood sugar 2 times daily. Dx Code- E11.51   ONETOUCH ULTRA test strip USE TO TEST BLOOD SUGAR TWICE DAILY   predniSONE (DELTASONE) 5 MG tablet Take 5 mg by mouth daily.   simvastatin (ZOCOR) 40 MG tablet TAKE  1 TABLET(40 MG) BY MOUTH AT BEDTIME   vitamin B-12 (CYANOCOBALAMIN) 1000 MCG tablet Take 1,000 mcg by mouth daily.     XARELTO 20 MG TABS tablet TAKE 1 TABLET(20 MG) BY MOUTH DAILY WITH SUPPER   No facility-administered encounter medications on file as of 03/05/2020.     Current Diagnosis/Assessment:    Goals Addressed   None     Hypertension   BP goal  is:  <140/90  Office blood pressures are  BP Readings from Last 3 Encounters:  02/12/20 (!) 180/88  01/29/20 130/80  01/12/20 (!) 144/82   Patient checks BP at home daily Patient home BP readings are ranging: unknown  Patient has failed these meds in the past: n/a Patient is currently controlled on the following medications:   Amlodipine 5 mg - 1.5 tablets daily  Carvedilol 25 mg BID  Furosemide 20 mg daily  Isosorbide MN 30 mg daily  Losartan 100 mg daily   We discussed BP goals; benefits of medications; caregiver reports pt has not mentioned any side effects, denies dizziness, lightheadedness, or recent falls.  Plan  Continue current medications   Hyperlipidemia / CAD   LDL goal < 70 CABG 1994, PCI 2002  Lipid Panel     Component Value Date/Time   CHOL 150 01/12/2020 1011   CHOL 196 06/17/2017 0838   TRIG 132 01/12/2020 1011   HDL 68 01/12/2020 1011   HDL 82 06/17/2017 0838   LDLCALC 60 01/12/2020 1011    Hepatic Function Latest Ref Rng & Units 01/12/2020 08/28/2019 01/11/2019  Total Protein 6.1 - 8.1 g/dL 7.2 7.1 7.2  Albumin 3.5 - 5.2 g/dL - 4.2 4.1  AST 10 - 35 U/L 17 18 16   ALT 6 - 29 U/L 14 16 15   Alk Phosphatase 39 - 117 U/L - 64 64  Total Bilirubin 0.2 - 1.2 mg/dL 0.6 0.6 0.5  Bilirubin, Direct 0.00 - 0.40 mg/dL - - -    The ASCVD Risk score Mikey Bussing DC Jr., et al., 2013) failed to calculate for the following reasons:   The 2013 ASCVD risk score is only valid for ages 68 to 67   Patient has failed these meds in past: n/a Patient is currently controlled on the following medications:   Simvastatin 40 mg daily  Isosorbide MN 30 mg daily  Aspirin 81 mg  - possibly not taking  We discussed:  Cholesterol goals; benefits of statin; indication for aspirin given history of CABG, PCI; caregiver reports when she was setting up pill boxes she was not including aspirin. Caregiver is planning to visit patient tomorrow and will net me know if aspirin is in the  pill box.  Plan  Continue current medications  Diabetes   A1c goal <7%  Recent Relevant Labs: Lab Results  Component Value Date/Time   HGBA1C 6.8 (H) 01/12/2020 10:11 AM   HGBA1C 6.6 (H) 08/28/2019 09:49 AM   GFR 52.17 (L) 08/28/2019 09:49 AM   GFR 53.28 (L) 01/11/2019 09:37 AM   MICROALBUR 1.6 02/12/2014 09:31 AM   MICROALBUR 2.1 (H) 07/10/2013 10:33 AM    Last diabetic Eye exam:  Lab Results  Component Value Date/Time   HMDIABEYEEXA No Retinopathy 10/10/2019 12:00 AM    Last diabetic Foot exam:  Lab Results  Component Value Date/Time   HMDIABFOOTEX done 12/19/2018 12:00 AM    Checking BG: Daily  Recent FBG Readings: unknown  Patient has failed these meds in past: n/a Patient is currently controlled on the following medications:  Metformin 500 mg - 1/2 tablet daily  We discussed: A1c goals; benefit of metformin; pt may not need to check BG every day since A1c is at goal and she only takes 1 oral med  Plan  Continue current medications  Gout   Patient has failed these meds in past: n/a Patient is currently controlled on the following medications:   Colchicine 0.6 mg every other day  We discussed:  Per caregiver pt has not reported a gout flare recently;   Plan  Continue current medications  Polymyalgia rheumatica / Osteoarthritis   Patient has failed these meds in past: n/a Patient is currently controlled on the following medications:   Prednisone 5 mg daily  Diclofenac 1% gel  Tylenol 325 mg - 2 tablets q6h PRN  We discussed:  Caregiver was not sure of indication for prednisone - she assumed for gout. Discussed prednisone as a treatment for autoimmune disease. Discussed common side effects of steroids, including increased appetite, weight gain, agitation, insomnia, high BP and BG - caregiver denies.  Plan  Continue current medications  Hypothyroidism   Lab Results  Component Value Date/Time   TSH 1.63 01/12/2020 10:11 AM   TSH 1.05  08/28/2019 09:49 AM   FREET4 0.88 08/15/2014 07:25 AM   Patient has failed these meds in past: n/a Patient is currently controlled on the following medications:   Levothyroxine 50 mcg daily  We discussed:  Pt takes med on empty stomach separate from other medications  Plan  Continue current medications   Osteoporosis   Last DEXA Scan: 06/01/2018   T-Score femoral neck: -2.5  T-Score total hip: -1.9  T-Score lumbar spine: -2.0  VITD  Date Value Ref Range Status  08/28/2019 70.00 30.00 - 100.00 ng/mL Final    Patient is a candidate for pharmacologic treatment due to T-Score < -2.5 in femoral neck  Patient has failed these meds in past: n/a Patient is currently controlled on the following medications:   Alendronate 70 mg weekly (1st prescribed 2012)  Calcium carbonate-Vitamin D   We discussed:  Recommend [phone] units of vitamin D daily. Recommend 1200 mg of calcium daily from dietary and supplemental sources. Counseled on oral bisphosphonate administration: take in the morning, 30 minutes prior to food with 6-8 oz of water. Do not lie down for at least 30 minutes after taking.  Plan  Continue current medications   Health Maintenance   Lab Results  Component Value Date/Time   VITAMINB12 >2000 (H) 06/09/2018 10:03 AM   VITAMINB12 >1500 (H) 06/13/2015 11:10 AM   VITAMINB12 >2,000 (H) 06/21/2011 08:43 AM   VITAMINB12 268 01/12/2008 11:22 AM   Patient is currently controlled on the following medications:   Vitamin B12 1000 mcg daily  We discussed:  Vitamin B12 levels have been high the last 4 times it was checked; discussed pt does not need to take this she she is no longer deficient  Plan  Recommend to stop vitamin B12 supplementation  Medication Management   Pt uses [facility] for all medications Uses pill box? Yes Pt endorses 100% compliance  We discussed: [facility] is the preferred pharmacy with her insurance; caregiver denies issues with  services; currently patient's children set up the pill box for pt each week; discussed benefits of pill pack, caregiver prefers to continue current management since it will be more difficult to account for dose changes with pill packs.  Plan  Continue current medication management strategy    Follow up: *** month phone visit  Charlene Brooke, PharmD, BCACP Clinical Pharmacist [facility] [phone]

## 2020-03-22 ENCOUNTER — Other Ambulatory Visit: Payer: Self-pay | Admitting: Internal Medicine

## 2020-03-22 ENCOUNTER — Other Ambulatory Visit: Payer: Self-pay | Admitting: Cardiovascular Disease

## 2020-03-24 ENCOUNTER — Other Ambulatory Visit: Payer: Self-pay | Admitting: Internal Medicine

## 2020-03-27 ENCOUNTER — Ambulatory Visit: Payer: Medicare Other | Admitting: Podiatry

## 2020-04-15 DIAGNOSIS — H349 Unspecified retinal vascular occlusion: Secondary | ICD-10-CM | POA: Diagnosis not present

## 2020-04-30 ENCOUNTER — Encounter: Payer: Self-pay | Admitting: Internal Medicine

## 2020-05-08 ENCOUNTER — Other Ambulatory Visit: Payer: Self-pay | Admitting: Internal Medicine

## 2020-05-14 ENCOUNTER — Ambulatory Visit (INDEPENDENT_AMBULATORY_CARE_PROVIDER_SITE_OTHER): Payer: Medicare Other | Admitting: Podiatry

## 2020-05-14 ENCOUNTER — Encounter: Payer: Self-pay | Admitting: Podiatry

## 2020-05-14 ENCOUNTER — Other Ambulatory Visit: Payer: Self-pay

## 2020-05-14 DIAGNOSIS — I739 Peripheral vascular disease, unspecified: Secondary | ICD-10-CM

## 2020-05-14 DIAGNOSIS — M79674 Pain in right toe(s): Secondary | ICD-10-CM

## 2020-05-14 DIAGNOSIS — B351 Tinea unguium: Secondary | ICD-10-CM

## 2020-05-14 DIAGNOSIS — E1142 Type 2 diabetes mellitus with diabetic polyneuropathy: Secondary | ICD-10-CM

## 2020-05-14 DIAGNOSIS — Z7952 Long term (current) use of systemic steroids: Secondary | ICD-10-CM | POA: Insufficient documentation

## 2020-05-14 DIAGNOSIS — M79675 Pain in left toe(s): Secondary | ICD-10-CM

## 2020-05-14 DIAGNOSIS — L84 Corns and callosities: Secondary | ICD-10-CM | POA: Diagnosis not present

## 2020-05-14 DIAGNOSIS — M1189 Other specified crystal arthropathies, multiple sites: Secondary | ICD-10-CM | POA: Insufficient documentation

## 2020-05-16 ENCOUNTER — Other Ambulatory Visit: Payer: Self-pay | Admitting: Internal Medicine

## 2020-05-17 NOTE — Progress Notes (Signed)
Subjective:  Patient ID: Sheila Morgan, female    DOB: 01/29/1933,  MRN: [id number]  87 y.o. female presents with at risk foot care. Pt has h/o NIDDM with PAD and painful thick toenails that are difficult to trim. Pain interferes with ambulation. Aggravating factors include wearing enclosed shoe gear. Pain is relieved with periodic professional debridement.Marland Kitchen    PCP: Binnie Rail, MD and last visit was: 01/12/2020.  Review of Systems: Negative except as noted in the HPI.  Past Medical History:  Diagnosis Date  . Arthritis   . Cancer ([place])    skin on nose .  mylenoma  . Coronary artery disease    status post post LAD angioplasty in 1993  and subsequent coronary artery bypass grafting x1 with a LIMA to the LAD July of 1994. She had stenting of her RCA with a bare-metal stent November 2002.  . Diabetes mellitus    2  . Gout   . Heart murmur   . Hematoma    Right groin  . Hemorrhoids   . Hiatal hernia   . History of blood transfusion   . HOH (hard of hearing)   . Hyperlipidemia   . Hypertension   . Left knee DJD   . Lightheadedness    has seen Dr Gwenlyn Found and ENT.. cant find out why  . Myocardial infarction ([place])   . Osteoporosis   . PONV (postoperative nausea and vomiting)   . Shingles 10/27/2013   Past Surgical History:  Procedure Laterality Date  . CORONARY ANGIOPLASTY  1993   LAD  . CORONARY ANGIOPLASTY WITH STENT PLACEMENT  03/09/01   RCA  . CORONARY ARTERY BYPASS GRAFT  1994  . JOINT REPLACEMENT Right 1998   hip  . NM MYOVIEW LTD  03/2012   Normal  . SKIN GRAFT     to nose skin cancer  . TOTAL HIP ARTHROPLASTY    . TOTAL KNEE ARTHROPLASTY Left 10/09/2013   Procedure: TOTAL KNEE ARTHROPLASTY;  Surgeon: Lorn Junes, MD;  Location: [place];  Service: Orthopedics;  Laterality: Left;   Patient Active Problem List   Diagnosis Date Noted  . Calcium pyrophosphate arthropathy of multiple sites 05/14/2020  . Long term (current) use of systemic steroids 05/14/2020  .  Small vessel disease ([place]) 02/12/2020  . Left leg DVT ([place]), 12/15/19 12/15/2019  . Urinary frequency 02/22/2019  . Confusion 02/22/2019  . Fall 07/14/2018  . Bilateral hearing loss 01/05/2018  . Memory difficulties 01/05/2018  . Multiple closed fractures of ribs of right side 01/19/2017  . Acute pain of right shoulder 01/05/2017  . Acute pain of right knee 01/05/2017  . B12 deficiency 06/13/2015  . Hypothyroidism 06/13/2015  . DM (diabetes mellitus) type II, controlled, with peripheral vascular disorder ([place]) 03/06/2014  . Gout 10/15/2013  . Arthritis   . Hiatal hernia   . Coronary artery disease   . Left knee DJD   . Benign paroxysmal positional vertigo 05/19/2013  . Weakness generalized 05/09/2013  . Pseudogout of left knee 03/29/2013  . Acute right lumbar radiculopathy 03/18/2013  . Shoulder impingement syndrome 01/25/2013  . Vertigo 10/17/2012  . Renal insufficiency, mild 06/20/2011  . Essential hypertension 12/31/2009  . Osteoporosis 12/31/2009  . SKIN CANCER, HX OF 12/31/2009  . Proteinuria 01/05/2008  . Anemia 12/21/2007  . Hyperlipidemia 09/08/2006  . Polymyalgia rheumatica ([place]) 09/08/2006  . CORONARY ARTERY BYPASS GRAFT, HX OF 09/08/2006    Current Outpatient Medications:  .  acetaminophen (TYLENOL) 325 MG tablet,  Take 2 tablets (650 mg total) by mouth every 6 (six) hours as needed for mild pain, fever or headache., Disp: , Rfl:  .  alendronate (FOSAMAX) 70 MG tablet, Take 70 mg by mouth every Saturday. Take with a full glass of water on an empty stomach. , Disp: , Rfl:  .  amLODipine (NORVASC) 5 MG tablet, 1 tablet, Disp: , Rfl:  .  aspirin 81 MG EC tablet, 1 tablet, Disp: , Rfl:  .  Calcium Carbonate-Vitamin D (CALCIUM 600-D PO), Take 1 tablet by mouth 2 (two) times daily. , Disp: , Rfl:  .  Calcium Carbonate-Vitamin D 600-400 MG-UNIT tablet, 1 tablet with food, Disp: , Rfl:  .  carvedilol (COREG) 25 MG tablet, 0.5 tablet, Disp: , Rfl:  .  colchicine 0.6 MG  tablet, Take 1 tablet by mouth every other day., Disp: , Rfl:  .  diclofenac Sodium (VOLTAREN) 1 % GEL, Apply topically., Disp: , Rfl:  .  furosemide (LASIX) 20 MG tablet, TAKE 1 TABLET(20 MG) BY MOUTH DAILY, Disp: 90 tablet, Rfl: 0 .  isosorbide mononitrate (IMDUR) 30 MG 24 hr tablet, 0.5 tablet, Disp: , Rfl:  .  levothyroxine (SYNTHROID) 25 MCG tablet, 1 tablet, Disp: , Rfl:  .  losartan (COZAAR) 100 MG tablet, 1 tablet, Disp: , Rfl:  .  metFORMIN (GLUCOPHAGE) 500 MG tablet, 1/2  tablet with meals, Disp: , Rfl:  .  MULTIPLE VITAMIN PO, See admin instructions., Disp: , Rfl:  .  OneTouch Delica Lancets 40J MISC, Use to test blood sugar 2 times daily. Dx Code- E11.51, Disp: 200 each, Rfl: 1 .  ONETOUCH ULTRA test strip, USE TO TEST BLOOD SUGAR TWICE DAILY, Disp: 200 strip, Rfl: 3 .  predniSONE (DELTASONE) 5 MG tablet, Take 1 tablet by mouth daily., Disp: , Rfl:  .  simvastatin (ZOCOR) 40 MG tablet, 1 tablet in the evening, Disp: , Rfl:  .  vitamin B-12 (CYANOCOBALAMIN) 100 MCG tablet, , Disp: , Rfl:  .  vitamin B-12 (CYANOCOBALAMIN) 1000 MCG tablet, Take 1,000 mcg by mouth daily.  , Disp: , Rfl:  .  XARELTO 20 MG TABS tablet, TAKE 1 TABLET(20 MG) BY MOUTH DAILY WITH SUPPER, Disp: 30 tablet, Rfl: 2 No Known Allergies Social History   Tobacco Use  Smoking Status Never Smoker  Smokeless Tobacco Never Used    Objective:  There were no vitals filed for this visit. Constitutional Patient is a pleasant 87 y.o. African American female WD, WN in NAD. AAO x 3.  Vascular Capillary refill time to digits immediate b/l. Palpable DP pulse(s) b/l lower extremities Nonpalpable PT pulse(s) b/l lower extremities. Pedal hair present. Lower extremity skin temperature gradient within normal limits. No cyanosis or clubbing noted.  Neurologic Normal speech. Protective sensation diminished with 10g monofilament b/l. Vibratory sensation diminished b/l.  Dermatologic Pedal skin is thin shiny, atrophic b/l lower  extremities. No open wounds bilaterally. No interdigital macerations bilaterally. Toenails 1-5 b/l elongated, discolored, dystrophic, thickened, crumbly with subungual debris and tenderness to dorsal palpation. Hyperkeratotic lesion(s) submet head 1 right foot, submet head 5 left foot and submet head 5 right foot.  No erythema, no edema, no drainage, no fluctuance.  Orthopedic: Normal muscle strength 5/5 to all lower extremity muscle groups bilaterally. No pain crepitus or joint limitation noted with ROM b/l. Hammertoes noted to the 2-5 bilaterally.   Hemoglobin A1C Latest Ref Rng & Units 01/12/2020 08/28/2019  HGBA1C <5.7 % of total Hgb 6.8(H) 6.6(H)  Some recent data might be  hidden   Assessment:   1. Pain due to onychomycosis of toenails of both feet   2. Callus   3. PAD (peripheral artery disease) ([place])   4. Diabetic peripheral neuropathy associated with type 2 diabetes mellitus ([place])    Plan:  Patient was evaluated and treated and all questions answered.  Onychomycosis with pain -Nails palliatively debridement as below. -Educated on self-care  Procedure: Nail Debridement Rationale: Pain Type of Debridement: manual, sharp debridement. Instrumentation: Nail nipper, rotary burr. Number of Nails: 10  -Examined patient. -No new findings. No new orders. -Continue diabetic foot care principles. -Patient to continue soft, supportive shoe gear daily. -Toenails 1-5 b/l were debrided in length and girth with sterile nail nippers and dremel without iatrogenic bleeding.  -Callus(es) submet head 1 right foot, submet head 5 left foot and submet head 5 right foot pared utilizing sterile scalpel blade without complication or incident. Total number debrided =3. -Patient to report any pedal injuries to medical professional immediately. -Patient/POA to call should there be question/concern in the interim.  Return in about 3 months (around 08/12/2020) for diabetic foot care.  Marzetta Board,  DPM

## 2020-05-21 MED ORDER — FUROSEMIDE 40 MG PO TABS: 40.0000 mg | ORAL_TABLET | Freq: Every day | ORAL | 0 refills | Status: DC

## 2020-06-04 ENCOUNTER — Other Ambulatory Visit: Payer: Self-pay | Admitting: Internal Medicine

## 2020-06-20 ENCOUNTER — Other Ambulatory Visit: Payer: Self-pay | Admitting: Internal Medicine

## 2020-06-20 DIAGNOSIS — Z01419 Encounter for gynecological examination (general) (routine) without abnormal findings: Secondary | ICD-10-CM | POA: Diagnosis not present

## 2020-06-25 ENCOUNTER — Other Ambulatory Visit: Payer: Self-pay | Admitting: Internal Medicine

## 2020-06-25 MED ORDER — CARVEDILOL 25 MG PO TABS: ORAL_TABLET | ORAL | 1 refills | Status: DC

## 2020-07-04 DIAGNOSIS — M7989 Other specified soft tissue disorders: Secondary | ICD-10-CM | POA: Diagnosis not present

## 2020-07-04 DIAGNOSIS — M1189 Other specified crystal arthropathies, multiple sites: Secondary | ICD-10-CM | POA: Diagnosis not present

## 2020-07-04 DIAGNOSIS — M810 Age-related osteoporosis without current pathological fracture: Secondary | ICD-10-CM | POA: Diagnosis not present

## 2020-07-04 DIAGNOSIS — M1711 Unilateral primary osteoarthritis, right knee: Secondary | ICD-10-CM | POA: Diagnosis not present

## 2020-07-04 DIAGNOSIS — Z7952 Long term (current) use of systemic steroids: Secondary | ICD-10-CM | POA: Diagnosis not present

## 2020-07-04 DIAGNOSIS — Z79899 Other long term (current) drug therapy: Secondary | ICD-10-CM | POA: Diagnosis not present

## 2020-07-04 DIAGNOSIS — M25561 Pain in right knee: Secondary | ICD-10-CM | POA: Diagnosis not present

## 2020-07-04 DIAGNOSIS — M150 Primary generalized (osteo)arthritis: Secondary | ICD-10-CM | POA: Diagnosis not present

## 2020-07-04 DIAGNOSIS — M170 Bilateral primary osteoarthritis of knee: Secondary | ICD-10-CM | POA: Diagnosis not present

## 2020-07-08 ENCOUNTER — Encounter: Payer: Self-pay | Admitting: Internal Medicine

## 2020-07-10 NOTE — Progress Notes (Signed)
Virtual Visit via failed video-telephone note  I connected with Sheila Morgan on 07/10/20 at  9:30 AM EST by a video enabled telemedicine application, but we were not able to hear each other so this was transitioned into a telephone call.  I verified that I am speaking with the correct person using two identifiers.   I discussed the limitations of evaluation and management by telemedicine and the availability of in person appointments. The patient expressed understanding and agreed to proceed.  Present for the visit:  Myself, Dr Billey Gosling, [name] and her daughter Sheila Morgan.  The patient is currently at home and I am in the office.    No referring provider.    History of Present Illness: She is here for follow up of her chronic medical conditions - DM, CAD, htn, hypothyroidism, hyperlipidemia, memory d/o, anemia, renal insuff, dvt  She did fall recently.  She was getting up off of her chair and was not able to get up on her own and slid down to the floor.  She bruised her knee, but there is no other injuries.  Her son did have to come and help her up.  No falls since then.  Her daughter helps with her medication and she is taking everything as prescribed.  She has no complaints or concerns.    Review of Systems  Constitutional: Negative for fever.  HENT: Positive for ear pain (left - intermittent, dull).        No change in hearing, no pain with eating  Respiratory: Negative for cough, shortness of breath and wheezing.   Cardiovascular: Negative for chest pain, palpitations and leg swelling.  Neurological: Negative for dizziness and headaches.     Social History   Socioeconomic History  . Marital status: Widowed    Spouse name: Not on file  . Number of children: 2  . Years of education: Not on file  . Highest education level: 12th grade  Occupational History  . Occupation: Retired   Tobacco Use  . Smoking status: Never Smoker  . Smokeless tobacco: Never Used   Vaping Use  . Vaping Use: Never used  Substance and Sexual Activity  . Alcohol use: No  . Drug use: No  . Sexual activity: Never  Other Topics Concern  . Not on file  Social History Narrative   Left handed    Caffeine 4 cups daily    Lives at home alone    Social Determinants of Health   Financial Resource Strain: Low Risk   . Difficulty of Paying Living Expenses: Not hard at all  Food Insecurity: No Food Insecurity  . Worried About Charity fundraiser in the Last Year: Never true  . Ran Out of Food in the Last Year: Never true  Transportation Needs: No Transportation Needs  . Lack of Transportation (Medical): No  . Lack of Transportation (Non-Medical): No  Physical Activity: Insufficiently Active  . Days of Exercise per Week: 3 days  . Minutes of Exercise per Session: 20 min  Stress: No Stress Concern Present  . Feeling of Stress : Not at all  Social Connections: Unknown  . Frequency of Communication with Friends and Family: More than three times a week  . Frequency of Social Gatherings with Friends and Family: More than three times a week  . Attends Religious Services: Patient refused  . Active Member of Clubs or Organizations: Patient refused  . Attends Archivist Meetings: Patient refused  . Marital Status:  Patient refused     Observations/Objective: Appears well in NAD when I saw her briefly on video   Assessment and Plan:  See Problem List for Assessment and Plan of chronic medical problems.   Follow Up Instructions:   Follow-up in 6 months   I discussed the assessment and treatment plan with the patient. The patient was provided an opportunity to ask questions and all were answered. The patient agreed with the plan and demonstrated an understanding of the instructions.   The patient was advised to call back or seek an in-person evaluation if the symptoms worsen or if the condition fails to improve as anticipated.  Time spent on telephone call  - 18 minutes  Binnie Rail, MD

## 2020-07-11 ENCOUNTER — Telehealth (INDEPENDENT_AMBULATORY_CARE_PROVIDER_SITE_OTHER): Payer: Medicare Other | Admitting: Internal Medicine

## 2020-07-11 ENCOUNTER — Encounter: Payer: Self-pay | Admitting: Internal Medicine

## 2020-07-11 DIAGNOSIS — I2583 Coronary atherosclerosis due to lipid rich plaque: Secondary | ICD-10-CM

## 2020-07-11 DIAGNOSIS — E782 Mixed hyperlipidemia: Secondary | ICD-10-CM | POA: Diagnosis not present

## 2020-07-11 DIAGNOSIS — I824Y2 Acute embolism and thrombosis of unspecified deep veins of left proximal lower extremity: Secondary | ICD-10-CM | POA: Diagnosis not present

## 2020-07-11 DIAGNOSIS — I10 Essential (primary) hypertension: Secondary | ICD-10-CM | POA: Diagnosis not present

## 2020-07-11 DIAGNOSIS — R413 Other amnesia: Secondary | ICD-10-CM

## 2020-07-11 DIAGNOSIS — E1151 Type 2 diabetes mellitus with diabetic peripheral angiopathy without gangrene: Secondary | ICD-10-CM

## 2020-07-11 DIAGNOSIS — D649 Anemia, unspecified: Secondary | ICD-10-CM

## 2020-07-11 DIAGNOSIS — N289 Disorder of kidney and ureter, unspecified: Secondary | ICD-10-CM

## 2020-07-11 DIAGNOSIS — E039 Hypothyroidism, unspecified: Secondary | ICD-10-CM | POA: Diagnosis not present

## 2020-07-11 DIAGNOSIS — I2510 Atherosclerotic heart disease of native coronary artery without angina pectoris: Secondary | ICD-10-CM | POA: Diagnosis not present

## 2020-07-11 NOTE — Assessment & Plan Note (Signed)
Chronic Has remained stable We will need to hold off on blood work since this is a virtual visit and due at her next appointment, sooner if possible

## 2020-07-11 NOTE — Assessment & Plan Note (Signed)
Chronic Continue simvastatin 40 mg daily 

## 2020-07-11 NOTE — Assessment & Plan Note (Signed)
Chronic Overall stable Her son and daughter are both involved in her care and she has good support

## 2020-07-11 NOTE — Assessment & Plan Note (Signed)
Chronic Continue levothyroxine 25 mcg daily

## 2020-07-11 NOTE — Assessment & Plan Note (Signed)
Chronic Because this is a virtual visit unsure what blood pressure is-not checking it regularly at home Continue current medications-amlodipine 5 mg daily, carvedilol 25 mg twice daily, Lasix 40 mg daily Imdur 30 mg daily, losartan 100 mg daily

## 2020-07-11 NOTE — Assessment & Plan Note (Signed)
Dx with DVT 12/15/19 - has taken xarelto just over 6 months - ok to stop now Stop xarelto Restart ASA 81 mg daily

## 2020-07-11 NOTE — Assessment & Plan Note (Signed)
Chronic Lab Results  Component Value Date   HGBA1C 6.8 (H) 01/12/2020   Sugars have been well controlled Continue Metformin to 50 mg twice daily

## 2020-08-01 ENCOUNTER — Other Ambulatory Visit: Payer: Self-pay | Admitting: Internal Medicine

## 2020-08-06 ENCOUNTER — Encounter: Payer: Self-pay | Admitting: Podiatry

## 2020-08-06 ENCOUNTER — Other Ambulatory Visit: Payer: Self-pay

## 2020-08-06 ENCOUNTER — Ambulatory Visit (INDEPENDENT_AMBULATORY_CARE_PROVIDER_SITE_OTHER): Payer: Medicare Other | Admitting: Podiatry

## 2020-08-06 DIAGNOSIS — M79675 Pain in left toe(s): Secondary | ICD-10-CM

## 2020-08-06 DIAGNOSIS — M79674 Pain in right toe(s): Secondary | ICD-10-CM | POA: Diagnosis not present

## 2020-08-06 DIAGNOSIS — B351 Tinea unguium: Secondary | ICD-10-CM | POA: Diagnosis not present

## 2020-08-06 DIAGNOSIS — E1151 Type 2 diabetes mellitus with diabetic peripheral angiopathy without gangrene: Secondary | ICD-10-CM | POA: Diagnosis not present

## 2020-08-06 DIAGNOSIS — L84 Corns and callosities: Secondary | ICD-10-CM

## 2020-08-11 NOTE — Progress Notes (Signed)
  Subjective:  Patient ID: Sheila Morgan, female    DOB: 08/07/1932,  MRN: [id number]  87 y.o. female presents with at risk foot care. Pt has h/o NIDDM with PAD and painful thick toenails that are difficult to trim. Pain interferes with ambulation. Aggravating factors include wearing enclosed shoe gear. Pain is relieved with periodic professional debridement.  Patient states blood glucose was 114 mg/dl this morning. She voices no new pedal problems on today's visit.  PCP: Binnie Rail, MD and last visit was: 01/12/2020.  Review of Systems: Negative except as noted in the HPI.   No Known Allergies   Objective:  There were no vitals filed for this visit. Constitutional Patient is a pleasant 87 y.o. African American female WD, WN in NAD. AAO x 3.  Vascular Capillary refill time to digits immediate b/l. Palpable DP pulse(s) b/l lower extremities Nonpalpable PT pulse(s) b/l lower extremities. Pedal hair present. Lower extremity skin temperature gradient within normal limits. No cyanosis or clubbing noted.  Neurologic Normal speech. Protective sensation diminished with 10g monofilament b/l. Vibratory sensation diminished b/l.  Dermatologic Pedal skin is thin shiny, atrophic b/l lower extremities. No open wounds bilaterally. No interdigital macerations bilaterally. Toenails 1-5 b/l elongated, discolored, dystrophic, thickened, crumbly with subungual debris and tenderness to dorsal palpation. Hyperkeratotic lesion(s) submet head 1 right foot, submet head 5 left foot and submet head 5 right foot.  No erythema, no edema, no drainage, no fluctuance.  Orthopedic: Normal muscle strength 5/5 to all lower extremity muscle groups bilaterally. No pain crepitus or joint limitation noted with ROM b/l. Hammertoes noted to the 2-5 bilaterally.   Hemoglobin A1C Latest Ref Rng & Units 01/12/2020 08/28/2019  HGBA1C <5.7 % of total Hgb 6.8(H) 6.6(H)  Some recent data might be hidden   Assessment:   1. Pain due to  onychomycosis of toenails of both feet   2. Callus   3. Type II diabetes mellitus with peripheral circulatory disorder [facility])    Plan:  Patient was evaluated and treated and all questions answered.  Onychomycosis with pain -Nails palliatively debridement as below. -Educated on self-care  Procedure: Nail Debridement Rationale: Pain Type of Debridement: manual, sharp debridement. Instrumentation: Nail nipper, rotary burr. Number of Nails: 10  -Examined patient. -No new findings. No new orders. -Continue diabetic foot care principles. -Patient to continue soft, supportive shoe gear daily. Start procedure for diabetic shoes. Patient qualifies based on diagnoses. -Toenails 1-5 b/l were debrided in length and girth with sterile nail nippers and dremel without iatrogenic bleeding.  -Callus(es) submet head 1 right foot, submet head 5 left foot and submet head 5 right foot pared utilizing sterile scalpel blade without complication or incident. Total number debrided =3. -Patient to report any pedal injuries to medical professional immediately. -Patient/POA to call should there be question/concern in the interim.  Return in about 3 months (around 11/05/2020).  Marzetta Board, DPM

## 2020-08-12 ENCOUNTER — Telehealth: Payer: Self-pay | Admitting: Pharmacist

## 2020-08-13 ENCOUNTER — Other Ambulatory Visit: Payer: Self-pay | Admitting: Internal Medicine

## 2020-08-13 NOTE — Progress Notes (Signed)
Chronic Care Management Pharmacy Assistant   Name: CHRISTEENA KROGH  MRN: [id number] DOB: 1933/01/17   Reason for Encounter: General Adherence Call    Recent office visits:  12/15/19 Dr. Sharlet Salina added Xarelto and increased furosemide to 40 mg daily 01/11/21 Dr. Quay Burow no medication changes 07/11/20 Dr. Quay Burow discontinued Xarelto Recent consult visits:  02/12/20 NP [facility], no medication changes  Hospital visits:  None in previous 6 months  Medications: Outpatient Encounter Medications as of 08/12/2020  Medication Sig  . acetaminophen (TYLENOL) 325 MG tablet Take 2 tablets (650 mg total) by mouth every 6 (six) hours as needed for mild pain, fever or headache.  . alendronate (FOSAMAX) 70 MG tablet Take 70 mg by mouth every Saturday. Take with a full glass of water on an empty stomach.   Marland Kitchen amLODipine (NORVASC) 5 MG tablet 1 tablet  . aspirin 81 MG EC tablet 1 tablet  . Calcium Carbonate-Vitamin D (CALCIUM 600-D PO) Take 1 tablet by mouth 2 (two) times daily.   . Calcium Carbonate-Vitamin D 600-400 MG-UNIT tablet 1 tablet with food  . carvedilol (COREG) 25 MG tablet 1 tablet PO BID with a meal  . colchicine 0.6 MG tablet Take 1 tablet by mouth every other day.  . diclofenac Sodium (VOLTAREN) 1 % GEL Apply topically.  . furosemide (LASIX) 40 MG tablet Take 1 tablet (40 mg total) by mouth daily.  . isosorbide mononitrate (IMDUR) 30 MG 24 hr tablet 0.5 tablet  . Lancets (ONETOUCH DELICA PLUS [id number]) MISC USE AS DIRECTED TO TEST BLOOD SUGAR TWICE DAILY  . levothyroxine (SYNTHROID) 25 MCG tablet 1 tablet  . losartan (COZAAR) 100 MG tablet 1 tablet  . metFORMIN (GLUCOPHAGE) 500 MG tablet 1/2  tablet with meals  . MULTIPLE VITAMIN PO See admin instructions.  Glory Rosebush ULTRA test strip USE TO TEST BLOOD SUGAR TWICE DAILY  . predniSONE (DELTASONE) 5 MG tablet Take 1 tablet by mouth daily.  . simvastatin (ZOCOR) 40 MG tablet 1 tablet in the evening  . vitamin B-12  (CYANOCOBALAMIN) 100 MCG tablet   . vitamin B-12 (CYANOCOBALAMIN) 1000 MCG tablet Take 1,000 mcg by mouth daily.     No facility-administered encounter medications on file as of 08/12/2020.   Pharmacist Review  Have you had any problems recently with your health? Spoke with patient daughter who states that her mother is not having any new health issues at this time  Have you had any problems with your pharmacy? Patient daughter states that the only problem is a few of her medications area little expensive and would like to see if there is any assistance for them, one was the colchicine she was not sure of the other is, she is not at her mother's at this time  What issues or side effects are you having with your medications? Daughter states that the patient is not having any side effects from medications  What would you like me to pass along to [facility] for them to help you with?  Daughter states that last Friday her mother had what she thinks is a panic attack or anxiety. She states that the mother lives along but that her brother stops by at least 2-3 times a day to check on their mother. This particular night her mother calls and stated that she was scared but did not know why. She said that it has not happen since then but still wanted it to be known just in case it does  What can we do to take care of you better? Daughter states that she likes the phone calls to check up on mother they are very much appreciated     Star Rating Drugs:  [place] Pharmacist Assistant [phone]  Time spent:

## 2020-08-20 ENCOUNTER — Ambulatory Visit (INDEPENDENT_AMBULATORY_CARE_PROVIDER_SITE_OTHER): Payer: Medicare Other | Admitting: Podiatry

## 2020-08-20 ENCOUNTER — Other Ambulatory Visit: Payer: Self-pay

## 2020-08-20 ENCOUNTER — Ambulatory Visit (INDEPENDENT_AMBULATORY_CARE_PROVIDER_SITE_OTHER): Payer: Medicare Other | Admitting: Pharmacist

## 2020-08-20 DIAGNOSIS — M810 Age-related osteoporosis without current pathological fracture: Secondary | ICD-10-CM

## 2020-08-20 DIAGNOSIS — I739 Peripheral vascular disease, unspecified: Secondary | ICD-10-CM

## 2020-08-20 DIAGNOSIS — E782 Mixed hyperlipidemia: Secondary | ICD-10-CM | POA: Diagnosis not present

## 2020-08-20 DIAGNOSIS — I2583 Coronary atherosclerosis due to lipid rich plaque: Secondary | ICD-10-CM | POA: Diagnosis not present

## 2020-08-20 DIAGNOSIS — I2510 Atherosclerotic heart disease of native coronary artery without angina pectoris: Secondary | ICD-10-CM

## 2020-08-20 DIAGNOSIS — E1151 Type 2 diabetes mellitus with diabetic peripheral angiopathy without gangrene: Secondary | ICD-10-CM

## 2020-08-20 DIAGNOSIS — I10 Essential (primary) hypertension: Secondary | ICD-10-CM | POA: Diagnosis not present

## 2020-08-20 DIAGNOSIS — M1A9XX0 Chronic gout, unspecified, without tophus (tophi): Secondary | ICD-10-CM

## 2020-08-20 NOTE — Progress Notes (Signed)
Patient presented for foam casting for 3 pair custom diabetic shoe inserts. Patient is measured with a [employer] Device to be a size 9 medium  Diabetic shoes are chosen from the [employer]  The shoes chosen are [id number]  The patient will be contacted when the shoes and inserts are ready to be picked up

## 2020-08-20 NOTE — Progress Notes (Signed)
Chronic Care Management Pharmacy Note  08/22/2020 Name:  Sheila Morgan MRN:  [id number] DOB:  July 01, 1932  Subjective: Sheila Morgan is an 87 y.o. year old female who is a primary patient of Burns, Claudina Lick, MD.  The CCM team was consulted for assistance with disease management and care coordination needs.    Engaged with patient by telephone for follow up visit in response to provider referral for pharmacy case management and/or care coordination services.   Consent to Services:  The patient was given information about Chronic Care Management services, agreed to services, and gave verbal consent prior to initiation of services.  Please see initial visit note for detailed documentation.   Patient Care Team: Binnie Rail, MD as PCP - General (Internal Medicine) Garald Balding, MD as Consulting Physician (Orthopedic Surgery) Noe Gens as Physician Assistant (Orthopedic Surgery) Marzetta Board, DPM as Consulting Physician (Podiatry) Leroux-Martinez, Nancy Marus, AUD (Audiology) Kathrynn Ducking, MD as Consulting Physician (Neurology) Charlton Haws, [facility] as Pharmacist (Pharmacist)  Patient lives alone currently, right now patient's son is coming over twice a day to help with medications and daily activities, and [employer] delivers once a day. Patient's children set up her pill box each week. Patient takes her own blood pressure and blood sugar each morning and writes them down.   Recent office visits: 07/11/20 Dr. Quay Burow discontinued Xarelto, completed over 6 mos of treatment for DVT 12/15/19. Restart Aspirin 81 mg daily.  04/30/20 pt message: PCP Increased furosemide to 40 mg daily  Recent consult visits: 02/12/20 NP [facility], no medication changes. Recommend no longer driving.  Hospital visits: None in previous 6 months  Objective:  Lab Results  Component Value Date   CREATININE 1.16 (H) 01/12/2020   BUN 16 01/12/2020   GFR  52.17 (L) 08/28/2019   GFRNONAA 43 (L) 01/12/2020   GFRAA 50 (L) 01/12/2020   NA 147 (H) 01/12/2020   K 3.7 01/12/2020   CALCIUM 10.7 (H) 01/12/2020   CO2 28 01/12/2020   GLUCOSE 121 (H) 01/12/2020    Lab Results  Component Value Date/Time   HGBA1C 6.8 (H) 01/12/2020 10:11 AM   HGBA1C 6.6 (H) 08/28/2019 09:49 AM   GFR 52.17 (L) 08/28/2019 09:49 AM   GFR 53.28 (L) 01/11/2019 09:37 AM   MICROALBUR 1.6 02/12/2014 09:31 AM   MICROALBUR 2.1 (H) 07/10/2013 10:33 AM    Last diabetic Eye exam:  Lab Results  Component Value Date/Time   HMDIABEYEEXA No Retinopathy 10/10/2019 12:00 AM    Last diabetic Foot exam:  Lab Results  Component Value Date/Time   HMDIABFOOTEX done 12/19/2018 12:00 AM     Lab Results  Component Value Date   CHOL 150 01/12/2020   HDL 68 01/12/2020   LDLCALC 60 01/12/2020   TRIG 132 01/12/2020   CHOLHDL 2.2 01/12/2020    Hepatic Function Latest Ref Rng & Units 01/12/2020 08/28/2019 01/11/2019  Total Protein 6.1 - 8.1 g/dL 7.2 7.1 7.2  Albumin 3.5 - 5.2 g/dL - 4.2 4.1  AST 10 - 35 U/L 17 18 16   ALT 6 - 29 U/L 14 16 15   Alk Phosphatase 39 - 117 U/L - 64 64  Total Bilirubin 0.2 - 1.2 mg/dL 0.6 0.6 0.5  Bilirubin, Direct 0.00 - 0.40 mg/dL - - -    Lab Results  Component Value Date/Time   TSH 1.63 01/12/2020 10:11 AM   TSH 1.05 08/28/2019 09:49 AM   FREET4 0.88  08/15/2014 07:25 AM    CBC Latest Ref Rng & Units 01/12/2020 08/28/2019 01/11/2019  WBC 3.8 - 10.8 Thousand/uL 9.7 10.5 9.4  Hemoglobin 11.7 - 15.5 g/dL 13.9 13.2 13.6  Hematocrit 35.0 - 45.0 % 42.1 40.0 40.1  Platelets 140 - 400 Thousand/uL 194 177.0 181.0    Lab Results  Component Value Date/Time   VD25OH 70.00 08/28/2019 09:49 AM   VD25OH 81.52 07/16/2017 08:25 AM    Clinical ASCVD: Yes  The ASCVD Risk score Mikey Bussing DC Jr., et al., 2013) failed to calculate for the following reasons:   The 2013 ASCVD risk score is only valid for ages 61 to 32    Depression screen PHQ 2/9 01/29/2020  08/28/2019 07/06/2018  Decreased Interest 0 0 0  Down, Depressed, Hopeless 0 0 0  PHQ - 2 Score 0 0 0  Altered sleeping - - -  Tired, decreased energy - - -  Change in appetite - - -  Feeling bad or failure about yourself  - - -  Trouble concentrating - - -  Moving slowly or fidgety/restless - - -  Suicidal thoughts - - -  PHQ-9 Score - - -  Difficult doing work/chores - - -  Some recent data might be hidden      Social History   Tobacco Use  Smoking Status Never Smoker  Smokeless Tobacco Never Used   BP Readings from Last 3 Encounters:  02/12/20 (!) 180/88  01/29/20 130/80  01/12/20 (!) 144/82   Pulse Readings from Last 3 Encounters:  02/12/20 78  01/29/20 72  01/12/20 75   Wt Readings from Last 3 Encounters:  02/12/20 157 lb 14.4 oz (71.6 kg)  01/29/20 157 lb 3.2 oz (71.3 kg)  01/12/20 160 lb (72.6 kg)   BMI Readings from Last 3 Encounters:  02/12/20 24.73 kg/m  01/29/20 24.62 kg/m  01/12/20 25.06 kg/m    Assessment/Interventions: Review of patient past medical history, allergies, medications, health status, including review of consultants reports, laboratory and other test data, was performed as part of comprehensive evaluation and provision of chronic care management services.   SDOH:  (Social Determinants of Health) assessments and interventions performed: Yes  SDOH Screenings   Alcohol Screen: Low Risk   . Last Alcohol Screening Score (AUDIT): 0  Depression (PHQ2-9): Low Risk   . PHQ-2 Score: 0  Financial Resource Strain: Low Risk   . Difficulty of Paying Living Expenses: Not hard at all  Food Insecurity: No Food Insecurity  . Worried About Charity fundraiser in the Last Year: Never true  . Ran Out of Food in the Last Year: Never true  Housing: Low Risk   . Last Housing Risk Score: 0  Physical Activity: Insufficiently Active  . Days of Exercise per Week: 3 days  . Minutes of Exercise per Session: 20 min  Social Connections: Unknown  . Frequency  of Communication with Friends and Family: More than three times a week  . Frequency of Social Gatherings with Friends and Family: More than three times a week  . Attends Religious Services: Patient refused  . Active Member of Clubs or Organizations: Patient refused  . Attends Archivist Meetings: Patient refused  . Marital Status: Patient refused  Stress: No Stress Concern Present  . Feeling of Stress : Not at all  Tobacco Use: Low Risk   . Smoking Tobacco Use: Never Smoker  . Smokeless Tobacco Use: Never Used  Transportation Needs: No Transportation Needs  . Lack  of Transportation (Medical): No  . Lack of Transportation (Non-Medical): No    CCM Care Plan  No Known Allergies  Medications Reviewed Today    Reviewed by Charlton Haws, [facility] (Pharmacist) on 08/22/20 at 1039  Med List Status: <None>  Medication Order Taking? Sig Documenting Provider Last Dose Status Informant  acetaminophen (TYLENOL) 325 MG tablet [id number] Yes Take 2 tablets (650 mg total) by mouth every 6 (six) hours as needed for mild pain, fever or headache. Rai, Vernelle [name], MD Taking Active   alendronate (FOSAMAX) 70 MG tablet [id number] Yes Take 70 mg by mouth every Saturday. Take with a full glass of water on an empty stomach. [provider] Taking Active Family Member  amLODipine (NORVASC) 5 MG tablet [id number] Yes 1 tablet [provider] Taking Active   aspirin 81 MG EC tablet [id number] Yes 1 tablet [provider] Taking Active   Calcium Carbonate-Vitamin D (CALCIUM 600-D PO) [id number] Yes Take 1 tablet by mouth 2 (two) times daily. [provider] Taking Active Family Member  Calcium Carbonate-Vitamin D 600-400 MG-UNIT tablet [id number] Yes 1 tablet with food [provider] Taking Active   carvedilol (COREG) 25 MG tablet [id number] Yes 1 tablet PO BID with a meal Binnie Rail, MD Taking Active   colchicine 0.6 MG tablet [id number] Yes Take 1 tablet by  mouth every other day. [provider] Taking Active   diclofenac Sodium (VOLTAREN) 1 % GEL [id number] Yes Apply topically. [provider] Taking Active   furosemide (LASIX) 40 MG tablet [id number] Yes TAKE 1 TABLET(40 MG) BY MOUTH DAILY Burns, Claudina Lick, MD Taking Active   isosorbide mononitrate (IMDUR) 30 MG 24 hr tablet [id number] Yes TAKE 1 TABLET(30 MG) BY MOUTH DAILY Burns, Claudina Lick, MD Taking Active   Lancets [facility] DELICA PLUS [id number]) [place] [id number] Yes USE AS DIRECTED TO TEST BLOOD SUGAR TWICE DAILY Burns, Claudina Lick, MD Taking Active   levothyroxine (SYNTHROID) 25 MCG tablet [id number] Yes 1 tablet [provider] Taking Active   losartan (COZAAR) 100 MG tablet [id number] Yes 1 tablet [provider] Taking Active   metFORMIN (GLUCOPHAGE) 500 MG tablet [id number] Yes 1/2  tablet with meals [provider] Taking Active   MULTIPLE VITAMIN PO [id number] Yes See admin instructions. [provider] Taking Active   [facility] ULTRA test strip [id number] Yes USE TO TEST BLOOD SUGAR TWICE DAILY Burns, Claudina Lick, MD Taking Active   predniSONE (DELTASONE) 5 MG tablet [id number] Yes Take 1 tablet by mouth daily. [provider] Taking Active   simvastatin (ZOCOR) 40 MG tablet [id number] Yes 1 tablet in the evening [provider] Taking Active   vitamin B-12 (CYANOCOBALAMIN) 100 MCG tablet [id number] Yes  [provider] Taking Active   vitamin B-12 (CYANOCOBALAMIN) 1000 MCG tablet [id number] Yes Take 1,000 mcg by mouth daily. [provider] Taking Active Family Member          Patient Active Problem List   Diagnosis Date Noted  . Calcium pyrophosphate arthropathy of multiple sites 05/14/2020  . Long term (current) use of systemic steroids 05/14/2020  . Small vessel disease ([place]) 02/12/2020  . Left leg DVT ([place]), 12/15/19 12/15/2019  . Urinary frequency 02/22/2019  . Confusion 02/22/2019  . Fall 07/14/2018  .  Bilateral hearing loss 01/05/2018  . Memory difficulties 01/05/2018  . Multiple closed fractures of ribs of right side 01/19/2017  . Acute pain of right shoulder 01/05/2017  . Acute pain of right knee 01/05/2017  .  B12 deficiency 06/13/2015  . Hypothyroidism 06/13/2015  . DM (diabetes mellitus) type II, controlled, with peripheral vascular disorder ([place]) 03/06/2014  . Gout 10/15/2013  . Arthritis   . Hiatal hernia   . Coronary artery disease   . Left knee DJD   . Benign paroxysmal positional vertigo 05/19/2013  . Weakness generalized 05/09/2013  . Pseudogout of left knee 03/29/2013  . Acute right lumbar radiculopathy 03/18/2013  . Shoulder impingement syndrome 01/25/2013  . Vertigo 10/17/2012  . Renal insufficiency, mild 06/20/2011  . Essential hypertension 12/31/2009  . Osteoporosis 12/31/2009  . SKIN CANCER, HX OF 12/31/2009  . Proteinuria 01/05/2008  . Anemia 12/21/2007  . Hyperlipidemia 09/08/2006  . Polymyalgia rheumatica ([place]) 09/08/2006  . CORONARY ARTERY BYPASS GRAFT, HX OF 09/08/2006    Immunization History  Administered Date(s) Administered  . Fluad Quad(high Dose 65+) 01/11/2019, 01/29/2020  . Influenza Whole 03/08/2007, 02/13/2008, 02/15/2009, 12/31/2009  . Influenza, High Dose Seasonal PF 02/12/2014, 01/05/2018  . Influenza,inj,Quad PF,6+ Mos 03/13/2015, 12/23/2015  . Influenza-Unspecified 02/04/2013, 02/17/2017  . PFIZER(Purple Top)SARS-COV-2 Vaccination 06/25/2019, 12/22/2019  . Pneumococcal Conjugate-13 06/13/2015  . Pneumococcal Polysaccharide-23 03/29/2013  . Td 01/07/2010    Conditions to be addressed/monitored:  Hypertension, Hyperlipidemia, Diabetes, Coronary Artery Disease, Chronic Kidney Disease, Osteoporosis, Osteoarthritis and Gout  Care Plan : CCM Pharmacy Care Plan  Updates made by Charlton Haws, [place] since 08/22/2020 12:00 AM    Problem: Hypertension, Hyperlipidemia, Diabetes, Coronary Artery Disease, Chronic Kidney Disease,  Osteoporosis, Osteoarthritis and Gout   Priority: High    Long-Range Goal: Disease management   Start Date: 08/22/2020  Expected End Date: 08/22/2021  This Visit's Progress: On track  Priority: High  Note:   Current Barriers:  . Unable to independently afford treatment regimen . Unable to independently monitor therapeutic efficacy . Suboptimal therapeutic regimen for gout  Pharmacist Clinical Goal(s):  Marland Kitchen Patient will verbalize ability to afford treatment regimen . achieve adherence to monitoring guidelines and medication adherence to achieve therapeutic efficacy . adhere to plan to optimize therapeutic regimen for gout as evidenced by report of adherence to recommended medication management changes through collaboration with PharmD and provider.   Interventions: . 1:1 collaboration with Binnie Rail, MD regarding development and update of comprehensive plan of care as evidenced by provider attestation and co-signature . Inter-disciplinary care team collaboration (see longitudinal plan of care) . Comprehensive medication review performed; medication list updated in electronic medical record  Hypertension    BP goal is:  <140/90 Patient checks BP at home daily Patient home BP readings are ranging: 130/80   Patient has failed these meds in the past: n/a Patient is currently controlled on the following medications:   Amlodipine 5 mg - 1.5 tablets daily  Carvedilol 25 mg BID  Furosemide 40 mg daily  Isosorbide MN 30 mg daily  Losartan 100 mg daily    We discussed BP goals; benefits of medications; caregiver reports pt has not mentioned any side effects, denies dizziness, lightheadedness, or recent falls.   Plan: Continue current medications    Hyperlipidemia / CAD    LDL goal < 70 CABG 1994, PCI 2002   Patient has failed these meds in past: n/a Patient is currently controlled on the following medications:   Simvastatin 40 mg daily  Isosorbide MN 30 mg  daily  Aspirin 81 mg   We discussed:  Cholesterol goals; benefits of statin; indication for aspirin given history of CABG, PCI; caregiver endorses aspirin is now included in her weekly pill  box   Plan: Continue current medications   Diabetes    A1c goal <7%   Patient has failed these meds in past: n/a Patient is currently controlled on the following medications:  Metformin 500 mg - 1/2 tablet daily   We discussed: A1c goals; benefit of metformin; pt may not need to check BG every day since A1c is at goal and she only takes 1 oral med   Plan: Continue current medications   Gout    Patient has failed these meds in past: n/a Patient is currently controlled on the following medications:   Colchicine 0.6 mg every other day (Dr Aryl - rheumatology)   We discussed:  Caregiver reports colchicine is expensive - likely Tier 3 due not non-preferred medication for prevention of gout; Per caregiver pt has not reported a gout flare in a long time; Caregiver does not remember patient ever taking allopurinol; discussed allopurinol is the drug of choice for gout prevention, and Tier 1 with her insurance; advised pt to discuss allopurinol vs colchicine with Dr Kathlene November   Plan: Pt to discuss switching colchicine to allopurinol (or d/c altogether) with prescriber Dr Kathlene November   Polymyalgia rheumatica / Osteoarthritis    Patient has failed these meds in past: n/a Patient is currently controlled on the following medications:   Prednisone 5 mg daily  Diclofenac 1% gel  Tylenol 325 mg - 2 tablets q6h PRN   We discussed:  Caregiver was not sure of indication for prednisone - she assumed for gout. Discussed prednisone as a treatment for autoimmune disease (PR). Discussed common side effects of steroids, including increased appetite, weight gain, agitation, insomnia, high BP and BG - caregiver denies.   Plan: Continue current medications    Osteoporosis    Last DEXA Scan: 06/01/2018               T-Score femoral neck: -2.5             T-Score total hip: -1.9             T-Score lumbar spine: -2.0              Patient is a candidate for pharmacologic treatment due to T-Score < -2.5 in femoral neck   Patient has failed these meds in past: n/a Patient is currently controlled on the following medications:   Alendronate 70 mg weekly - per Dr Kathlene November (1st rx'd 2012 in chart)  Calcium carbonate-Vitamin D    We discussed:  unknown if patient has been taking alendronate consistently for 10 years or if there have been breaks; discussed length of therapy for bisphosphonates is 10 years - no established benefit beyond that   Plan: Pt to discuss alendronate length of therapy with prescriber Dr Kathlene November  Patient Goals/Self-Care Activities . Patient will:  - take medications as prescribed focus on medication adherence by pill box collaborate with provider on medication access solutions Discuss switching colchicine to allopurinol with Dr Kathlene November  Follow Up Plan: Telephone follow up appointment with care management team member scheduled for: 6 months      Medication Assistance:  Colchicine is Tier 3 - will pursue tier exception. Pt also to discuss switching to allopurinol with prescriber.  Patient's preferred pharmacy is:  [id number] [address], [address] [place] [address] Phone: [phone] Fax: [phone]  Uses pill box? Yes Pt endorses 100% compliance  We discussed: Current pharmacy is preferred with insurance plan and patient is satisfied with pharmacy services Patient decided to: Continue current medication management strategy  Care Plan and Follow Up Patient Decision:  Patient agrees to Care Plan and Follow-up.  Plan: Telephone follow up appointment with care management team member scheduled for:  6 months  Charlene Brooke, PharmD, [place], CPP Clinical Pharmacist [place]  [facility] [phone]

## 2020-08-22 ENCOUNTER — Other Ambulatory Visit: Payer: Self-pay

## 2020-08-22 NOTE — Patient Instructions (Signed)
Visit Information  Phone number for Pharmacist: [phone]  Patient Care Plan: CCM Pharmacy Care Plan    Problem Identified: Hypertension, Hyperlipidemia, Diabetes, Coronary Artery Disease, Chronic Kidney Disease, Osteoporosis, Osteoarthritis and Gout   Priority: High    Long-Range Goal: Disease management   Start Date: 08/22/2020  Expected End Date: 08/22/2021  This Visit's Progress: On track  Priority: High  Note:   Current Barriers:  . Unable to independently afford treatment regimen . Unable to independently monitor therapeutic efficacy . Suboptimal therapeutic regimen for gout  Pharmacist Clinical Goal(s):  Marland Kitchen Patient will verbalize ability to afford treatment regimen . achieve adherence to monitoring guidelines and medication adherence to achieve therapeutic efficacy . adhere to plan to optimize therapeutic regimen for gout as evidenced by report of adherence to recommended medication management changes through collaboration with PharmD and provider.   Interventions: . 1:1 collaboration with Binnie Rail, MD regarding development and update of comprehensive plan of care as evidenced by provider attestation and co-signature . Inter-disciplinary care team collaboration (see longitudinal plan of care) . Comprehensive medication review performed; medication list updated in electronic medical record  Hypertension    BP goal is:  <140/90 Patient checks BP at home daily Patient home BP readings are ranging: 130/80   Patient has failed these meds in the past: n/a Patient is currently controlled on the following medications:   Amlodipine 5 mg - 1.5 tablets daily  Carvedilol 25 mg BID  Furosemide 40 mg daily  Isosorbide MN 30 mg daily  Losartan 100 mg daily    We discussed BP goals; benefits of medications; caregiver reports pt has not mentioned any side effects, denies dizziness, lightheadedness, or recent falls.   Plan: Continue current medications     Hyperlipidemia / CAD    LDL goal < 70 CABG 1994, PCI 2002   Patient has failed these meds in past: n/a Patient is currently controlled on the following medications:   Simvastatin 40 mg daily  Isosorbide MN 30 mg daily  Aspirin 81 mg   We discussed:  Cholesterol goals; benefits of statin; indication for aspirin given history of CABG, PCI; caregiver endorses aspirin is now included in her weekly pill box   Plan: Continue current medications   Diabetes    A1c goal <7%   Patient has failed these meds in past: n/a Patient is currently controlled on the following medications:  Metformin 500 mg - 1/2 tablet daily   We discussed: A1c goals; benefit of metformin; pt may not need to check BG every day since A1c is at goal and she only takes 1 oral med   Plan: Continue current medications   Gout    Patient has failed these meds in past: n/a Patient is currently controlled on the following medications:   Colchicine 0.6 mg every other day (Dr Aryl - rheumatology)   We discussed:  Caregiver reports colchicine is expensive - likely Tier 3 due not non-preferred medication for prevention of gout; Per caregiver pt has not reported a gout flare in a long time; Caregiver does not remember patient ever taking allopurinol; discussed allopurinol is the drug of choice for gout prevention, and Tier 1 with her insurance; advised pt to discuss allopurinol vs colchicine with Dr Kathlene November   Plan: Pt to discuss switching colchicine to allopurinol (or d/c altogether) with prescriber Dr Kathlene November   Polymyalgia rheumatica / Osteoarthritis    Patient has failed these meds in past: n/a Patient is currently controlled on the following medications:  Prednisone 5 mg daily  Diclofenac 1% gel  Tylenol 325 mg - 2 tablets q6h PRN   We discussed:  Caregiver was not sure of indication for prednisone - she assumed for gout. Discussed prednisone as a treatment for autoimmune disease (PR). Discussed common side  effects of steroids, including increased appetite, weight gain, agitation, insomnia, high BP and BG - caregiver denies.   Plan: Continue current medications    Osteoporosis    Last DEXA Scan: 06/01/2018              T-Score femoral neck: -2.5             T-Score total hip: -1.9             T-Score lumbar spine: -2.0              Patient is a candidate for pharmacologic treatment due to T-Score < -2.5 in femoral neck   Patient has failed these meds in past: n/a Patient is currently controlled on the following medications:   Alendronate 70 mg weekly - per Dr Kathlene November (1st rx'd 2012 in chart)  Calcium carbonate-Vitamin D    We discussed:  unknown if patient has been taking alendronate consistently for 10 years or if there have been breaks; discussed length of therapy for bisphosphonates is 10 years - no established benefit beyond that   Plan: Pt to discuss alendronate length of therapy with prescriber Dr Kathlene November  Patient Goals/Self-Care Activities . Patient will:  - take medications as prescribed focus on medication adherence by pill box collaborate with provider on medication access solutions Discuss switching colchicine to allopurinol with Dr Kathlene November  Follow Up Plan: Telephone follow up appointment with care management team member scheduled for: 6 months     Patient verbalizes understanding of instructions provided today and agrees to view in [place].  Telephone follow up appointment with pharmacy team member scheduled for: 6 months  Charlene Brooke, PharmD, [place], CPP Clinical Pharmacist [place] [facility] [phone]

## 2020-09-04 ENCOUNTER — Other Ambulatory Visit: Payer: Self-pay | Admitting: Cardiovascular Disease

## 2020-09-04 ENCOUNTER — Other Ambulatory Visit: Payer: Self-pay | Admitting: Internal Medicine

## 2020-09-09 ENCOUNTER — Encounter: Payer: Self-pay | Admitting: Internal Medicine

## 2020-09-09 NOTE — Progress Notes (Signed)
Subjective:    Patient ID: Sheila Morgan, female    DOB: [birth year], 87 y.o.   MRN: [id number]  HPI The patient is here for an acute visit.  She is here with her daughter.  We will also follow-up on her chronic conditions since her last visit was virtual.   She is here today because of left foot swelling.  The patient noticed this intermittently and is on the top of her left foot.  She denies any pain.  She denies any ankle or lower leg swelling.  There was concern whether this could represent a blood clot.  She has no other concerns.  She is taking her medication as prescribed.    Medications and allergies reviewed with patient and updated if appropriate.  Patient Active Problem List   Diagnosis Date Noted  . Calcium pyrophosphate arthropathy of multiple sites 05/14/2020  . Long term (current) use of systemic steroids 05/14/2020  . Small vessel disease ([place]) 02/12/2020  . Left leg DVT ([place]), 12/15/19 12/15/2019  . Urinary frequency 02/22/2019  . Confusion 02/22/2019  . Fall 07/14/2018  . Bilateral hearing loss 01/05/2018  . Memory difficulties 01/05/2018  . Multiple closed fractures of ribs of right side 01/19/2017  . Acute pain of right shoulder 01/05/2017  . Acute pain of right knee 01/05/2017  . B12 deficiency 06/13/2015  . Hypothyroidism 06/13/2015  . DM (diabetes mellitus) type II, controlled, with peripheral vascular disorder ([place]) 03/06/2014  . Gout 10/15/2013  . Arthritis   . Hiatal hernia   . Coronary artery disease   . Left knee DJD   . Benign paroxysmal positional vertigo 05/19/2013  . Weakness generalized 05/09/2013  . Pseudogout of left knee 03/29/2013  . Acute right lumbar radiculopathy 03/18/2013  . Shoulder impingement syndrome 01/25/2013  . Vertigo 10/17/2012  . Renal insufficiency, mild 06/20/2011  . Essential hypertension 12/31/2009  . Osteoporosis 12/31/2009  . SKIN CANCER, HX OF 12/31/2009  . Proteinuria 01/05/2008  . Anemia 12/21/2007  .  Hyperlipidemia 09/08/2006  . Polymyalgia rheumatica ([place]) 09/08/2006  . CORONARY ARTERY BYPASS GRAFT, HX OF 09/08/2006    Current Outpatient Medications on File Prior to Visit  Medication Sig Dispense Refill  . acetaminophen (TYLENOL) 325 MG tablet Take 2 tablets (650 mg total) by mouth every 6 (six) hours as needed for mild pain, fever or headache.    . alendronate (FOSAMAX) 70 MG tablet Take 70 mg by mouth every Saturday. Take with a full glass of water on an empty stomach.    Marland Kitchen amLODipine (NORVASC) 5 MG tablet TAKE 1 AND 1/2 TABLETS(7.5 MG) BY MOUTH DAILY 135 tablet 2  . aspirin 81 MG EC tablet 1 tablet    . Calcium Carbonate-Vitamin D (CALCIUM 600-D PO) Take 1 tablet by mouth 2 (two) times daily.    . Calcium Carbonate-Vitamin D 600-400 MG-UNIT tablet 1 tablet with food    . carvedilol (COREG) 25 MG tablet 1 tablet PO BID with a meal 180 tablet 1  . colchicine 0.6 MG tablet Take 1 tablet by mouth every other day.    . diclofenac Sodium (VOLTAREN) 1 % GEL Apply topically.    . furosemide (LASIX) 40 MG tablet TAKE 1 TABLET(40 MG) BY MOUTH DAILY 90 tablet 0  . isosorbide mononitrate (IMDUR) 30 MG 24 hr tablet TAKE 1 TABLET(30 MG) BY MOUTH DAILY 90 tablet 1  . Lancets (ONETOUCH DELICA PLUS [id number]) MISC USE AS DIRECTED TO TEST BLOOD SUGAR TWICE DAILY 200 each 1  .  levothyroxine (SYNTHROID) 25 MCG tablet 1 tablet    . levothyroxine (SYNTHROID) 50 MCG tablet TAKE 1 TABLET(50 MCG) BY MOUTH DAILY 90 tablet 1  . losartan (COZAAR) 100 MG tablet TAKE 1 TABLET(100 MG) BY MOUTH DAILY 90 tablet 2  . metFORMIN (GLUCOPHAGE) 500 MG tablet TAKE 1/2 TABLET(250 MG) BY MOUTH DAILY WITH LARGEST MEAL 45 tablet 2  . MULTIPLE VITAMIN PO See admin instructions.    Glory Rosebush ULTRA test strip USE TO TEST BLOOD SUGAR TWICE DAILY 200 strip 3  . predniSONE (DELTASONE) 5 MG tablet Take 1 tablet by mouth daily.    . simvastatin (ZOCOR) 40 MG tablet 1 tablet in the evening    . vitamin B-12 (CYANOCOBALAMIN) 1000 MCG  tablet Take 1,000 mcg by mouth daily.    Marland Kitchen glucose blood test strip OneTouch Ultra Test strips  USE TO TEST BLOOD SUGAR TWICE DAILY     No current facility-administered medications on file prior to visit.    Past Medical History:  Diagnosis Date  . Arthritis   . Cancer ([place])    skin on nose .  mylenoma  . Coronary artery disease    status post post LAD angioplasty in 1993  and subsequent coronary artery bypass grafting x1 with a LIMA to the LAD July of 1994. She had stenting of her RCA with a bare-metal stent November 2002.  . Diabetes mellitus    2  . Gout   . Heart murmur   . Hematoma    Right groin  . Hemorrhoids   . Hiatal hernia   . History of blood transfusion   . HOH (hard of hearing)   . Hyperlipidemia   . Hypertension   . Left knee DJD   . Lightheadedness    has seen Dr Gwenlyn Found and ENT.. cant find out why  . Myocardial infarction ([place])   . Osteoporosis   . PONV (postoperative nausea and vomiting)   . Shingles 10/27/2013    Past Surgical History:  Procedure Laterality Date  . CORONARY ANGIOPLASTY  1993   LAD  . CORONARY ANGIOPLASTY WITH STENT PLACEMENT  03/09/01   RCA  . CORONARY ARTERY BYPASS GRAFT  1994  . JOINT REPLACEMENT Right 1998   hip  . NM MYOVIEW LTD  03/2012   Normal  . SKIN GRAFT     to nose skin cancer  . TOTAL HIP ARTHROPLASTY    . TOTAL KNEE ARTHROPLASTY Left 10/09/2013   Procedure: TOTAL KNEE ARTHROPLASTY;  Surgeon: Lorn Junes, MD;  Location: [facility];  Service: Orthopedics;  Laterality: Left;    Social History   Socioeconomic History  . Marital status: Widowed    Spouse name: Not on file  . Number of children: 2  . Years of education: Not on file  . Highest education level: 12th grade  Occupational History  . Occupation: Retired   Tobacco Use  . Smoking status: Never Smoker  . Smokeless tobacco: Never Used  Vaping Use  . Vaping Use: Never used  Substance and Sexual Activity  . Alcohol use: No  . Drug use: No  . Sexual  activity: Never  Other Topics Concern  . Not on file  Social History Narrative   Left handed    Caffeine 4 cups daily    Lives at home alone    Social Determinants of Health   Financial Resource Strain: Low Risk   . Difficulty of Paying Living Expenses: Not hard at all  Food Insecurity: No Food  Insecurity  . Worried About Charity fundraiser in the Last Year: Never true  . Ran Out of Food in the Last Year: Never true  Transportation Needs: No Transportation Needs  . Lack of Transportation (Medical): No  . Lack of Transportation (Non-Medical): No  Physical Activity: Insufficiently Active  . Days of Exercise per Week: 3 days  . Minutes of Exercise per Session: 20 min  Stress: No Stress Concern Present  . Feeling of Stress : Not at all  Social Connections: Unknown  . Frequency of Communication with Friends and Family: More than three times a week  . Frequency of Social Gatherings with Friends and Family: More than three times a week  . Attends Religious Services: Patient refused  . Active Member of Clubs or Organizations: Patient refused  . Attends Archivist Meetings: Patient refused  . Marital Status: Patient refused    Family History  Problem Relation Age of Onset  . Heart disease Father   . Cancer Sister        breast  . Diabetes Sister   . Heart disease Brother   . Heart disease Brother   . Heart disease Brother   . Diabetes Sister   . Stroke Mother     Review of Systems  Constitutional: Negative for chills and fever.  Respiratory: Negative for cough, shortness of breath and wheezing.   Cardiovascular: Negative for chest pain, palpitations and leg swelling.  Neurological: Negative for light-headedness and headaches.       Objective:   Vitals:   09/10/20 1043  BP: (!) 160/76  Pulse: 71  Temp: 98.4 F (36.9 C)  SpO2: 98%   BP Readings from Last 3 Encounters:  09/10/20 (!) 160/76  02/12/20 (!) 180/88  01/29/20 130/80   Wt Readings from Last  3 Encounters:  09/10/20 160 lb 3.2 oz (72.7 kg)  02/12/20 157 lb 14.4 oz (71.6 kg)  01/29/20 157 lb 3.2 oz (71.3 kg)   Body mass index is 25.09 kg/m.   Physical Exam    Constitutional: Appears well-developed and well-nourished. No distress.  Head: Normocephalic and atraumatic.  Neck: Neck supple. No tracheal deviation present. No thyromegaly present.  No cervical lymphadenopathy Cardiovascular: Normal rate, regular rhythm and normal heart sounds.  No murmur heard. No carotid bruit .  No edema Pulmonary/Chest: Effort normal and breath sounds normal. No respiratory distress. No has no wheezes. No rales.  Msk: palpable bone on lateral left foot on dorsal surface - this is the swelling she noted - no actual edema Skin: Skin is warm and dry. Not diaphoretic.  Psychiatric: Normal mood and affect. Behavior is normal.       Assessment & Plan:    See Problem List for Assessment and Plan of chronic medical problems.    This visit occurred during the SARS-CoV-2 public health emergency.  Safety protocols were in place, including screening questions prior to the visit, additional usage of staff PPE, and extensive cleaning of exam room while observing appropriate contact time as indicated for disinfecting solutions.

## 2020-09-10 ENCOUNTER — Other Ambulatory Visit: Payer: Self-pay

## 2020-09-10 ENCOUNTER — Ambulatory Visit (INDEPENDENT_AMBULATORY_CARE_PROVIDER_SITE_OTHER): Payer: Medicare Other | Admitting: Internal Medicine

## 2020-09-10 ENCOUNTER — Encounter: Payer: Self-pay | Admitting: Internal Medicine

## 2020-09-10 VITALS — BP 160/76 | HR 71 | Temp 98.4°F | Ht 67.0 in | Wt 160.2 lb

## 2020-09-10 DIAGNOSIS — E1151 Type 2 diabetes mellitus with diabetic peripheral angiopathy without gangrene: Secondary | ICD-10-CM

## 2020-09-10 DIAGNOSIS — E039 Hypothyroidism, unspecified: Secondary | ICD-10-CM | POA: Diagnosis not present

## 2020-09-10 DIAGNOSIS — E782 Mixed hyperlipidemia: Secondary | ICD-10-CM | POA: Diagnosis not present

## 2020-09-10 DIAGNOSIS — I2510 Atherosclerotic heart disease of native coronary artery without angina pectoris: Secondary | ICD-10-CM

## 2020-09-10 DIAGNOSIS — M7989 Other specified soft tissue disorders: Secondary | ICD-10-CM | POA: Diagnosis not present

## 2020-09-10 DIAGNOSIS — I10 Essential (primary) hypertension: Secondary | ICD-10-CM

## 2020-09-10 DIAGNOSIS — I2583 Coronary atherosclerosis due to lipid rich plaque: Secondary | ICD-10-CM

## 2020-09-10 LAB — COMPREHENSIVE METABOLIC PANEL
ALT: 17 U/L (ref 0–35)
AST: 15 U/L (ref 0–37)
Albumin: 3.9 g/dL (ref 3.5–5.2)
Alkaline Phosphatase: 73 U/L (ref 39–117)
BUN: 17 mg/dL (ref 6–23)
CO2: 33 mEq/L — ABNORMAL HIGH (ref 19–32)
Calcium: 10.7 mg/dL — ABNORMAL HIGH (ref 8.4–10.5)
Chloride: 100 mEq/L (ref 96–112)
Creatinine, Ser: 1.26 mg/dL — ABNORMAL HIGH (ref 0.40–1.20)
GFR: 38.8 mL/min — ABNORMAL LOW (ref >60.00)
Glucose, Bld: 147 mg/dL — ABNORMAL HIGH (ref 70–99)
Potassium: 3 mEq/L — ABNORMAL LOW (ref 3.5–5.1)
Sodium: 141 mEq/L (ref 135–145)
Total Bilirubin: 0.5 mg/dL (ref 0.2–1.2)
Total Protein: 7 g/dL (ref 6.0–8.3)

## 2020-09-10 LAB — HEMOGLOBIN A1C: Hgb A1c MFr Bld: 7.5 % — ABNORMAL HIGH (ref 4.6–6.5)

## 2020-09-10 LAB — CBC WITH DIFFERENTIAL/PLATELET
Basophils Absolute: 0.1 10*3/uL (ref 0.0–0.1)
Basophils Relative: 0.8 % (ref 0.0–3.0)
Eosinophils Absolute: 0.1 10*3/uL (ref 0.0–0.7)
Eosinophils Relative: 1.3 % (ref 0.0–5.0)
HCT: 38.4 % (ref 36.0–46.0)
Hemoglobin: 13 g/dL (ref 12.0–15.0)
Lymphocytes Relative: 18.8 % (ref 12.0–46.0)
Lymphs Abs: 2 10*3/uL (ref 0.7–4.0)
MCHC: 33.8 g/dL (ref 30.0–36.0)
MCV: 89.8 fl (ref 78.0–100.0)
Monocytes Absolute: 1.1 10*3/uL — ABNORMAL HIGH (ref 0.1–1.0)
Monocytes Relative: 10 % (ref 3.0–12.0)
Neutro Abs: 7.3 10*3/uL (ref 1.4–7.7)
Neutrophils Relative %: 69.1 % (ref 43.0–77.0)
Platelets: 184 10*3/uL (ref 150.0–400.0)
RBC: 4.28 Mil/uL (ref 3.87–5.11)
RDW: 15.5 % (ref 11.5–15.5)
WBC: 10.5 10*3/uL (ref 4.0–10.5)

## 2020-09-10 LAB — LIPID PANEL
Cholesterol: 133 mg/dL (ref 0–200)
HDL: 56.9 mg/dL (ref >39.00)
LDL Cholesterol: 38 mg/dL (ref 0–99)
NonHDL: 76.26
Total CHOL/HDL Ratio: 2
Triglycerides: 191 mg/dL — ABNORMAL HIGH (ref 0.0–149.0)
VLDL: 38.2 mg/dL (ref 0.0–40.0)

## 2020-09-10 LAB — TSH: TSH: 2.1 u[IU]/mL (ref 0.35–4.50)

## 2020-09-10 NOTE — Assessment & Plan Note (Signed)
Acute No evidence of swelling on exam of her foot-she does have a prominent bone on the dorsal aspect of her foot that is nontender and looks benign Has podiatry appointment scheduled this summer-okay to wait until that appointment and can address with them-reassured that this is nothing to do with the blood clot

## 2020-09-10 NOTE — Assessment & Plan Note (Signed)
Chronic Continue levothyroxine 50 mcg daily Check TSH-we will titrate medication dose if needed

## 2020-09-10 NOTE — Assessment & Plan Note (Signed)
Chronic Sugars have been controlled Continue metformin to 50 mg daily Check A1c and will reevaluate if metformin is needed

## 2020-09-10 NOTE — Assessment & Plan Note (Signed)
Chronic No symptoms consistent with angina Has not seen cardiology in a while and advised that she needs to make an appointment

## 2020-09-10 NOTE — Assessment & Plan Note (Signed)
Chronic Check lipid panel, CMP Continue simvastatin 40 mg daily

## 2020-09-10 NOTE — Patient Instructions (Addendum)
  Blood work was ordered.     Medications changes include :   none     Please followup in September as scheduled

## 2020-09-10 NOTE — Assessment & Plan Note (Signed)
Chronic Blood pressure elevated here today Advised her daughter to monitor BP at home to see if it is truly elevated No change in medications today Continue losartan 100 mg daily, Imdur 30 mg daily, Lasix 40 mg daily, carvedilol 25 mg daily and amlodipine 7.5 mg daily CMP

## 2020-09-11 ENCOUNTER — Telehealth: Payer: Self-pay | Admitting: Internal Medicine

## 2020-09-11 ENCOUNTER — Telehealth: Payer: Self-pay | Admitting: Pharmacist

## 2020-09-11 MED ORDER — POTASSIUM CHLORIDE ER 10 MEQ PO TBCR: 30.0000 meq | EXTENDED_RELEASE_TABLET | Freq: Once | ORAL | 0 refills | Status: DC

## 2020-09-11 MED ORDER — FUROSEMIDE 20 MG PO TABS: 20.0000 mg | ORAL_TABLET | Freq: Every day | ORAL | 1 refills | Status: DC

## 2020-09-11 NOTE — Addendum Note (Signed)
Addended by: Binnie Rail on: 09/11/2020 06:01 AM   Modules accepted: Orders

## 2020-09-11 NOTE — Telephone Encounter (Signed)
Results given to daughter this morning.

## 2020-09-11 NOTE — Telephone Encounter (Signed)
Patients daughter calling back, please call her with patients results

## 2020-09-12 NOTE — Progress Notes (Signed)
Chronic Care Management Pharmacy Assistant   Name: DALEXA GENTZ  MRN: [id number] DOB: [birth year]    Reason for Encounter: Disease State General Call    Recent office visits:  09/10/20 Dr. Quay Burow changed furosemide to 20 mg daily and increased levothyroxine to 50 mcg, started potassium chloride meq 10 and increased vitamin B-12 to 1051mcg  Recent consult visits:  None ID  Hospital visits:  None in previous 6 months  Medications: Outpatient Encounter Medications as of 09/11/2020  Medication Sig  . acetaminophen (TYLENOL) 325 MG tablet Take 2 tablets (650 mg total) by mouth every 6 (six) hours as needed for mild pain, fever or headache.  . alendronate (FOSAMAX) 70 MG tablet Take 70 mg by mouth every Saturday. Take with a full glass of water on an empty stomach.  Marland Kitchen amLODipine (NORVASC) 5 MG tablet TAKE 1 AND 1/2 TABLETS(7.5 MG) BY MOUTH DAILY  . aspirin 81 MG EC tablet 1 tablet  . Calcium Carbonate-Vitamin D (CALCIUM 600-D PO) Take 1 tablet by mouth 2 (two) times daily.  . Calcium Carbonate-Vitamin D 600-400 MG-UNIT tablet 1 tablet with food  . carvedilol (COREG) 25 MG tablet 1 tablet PO BID with a meal  . colchicine 0.6 MG tablet Take 1 tablet by mouth every other day.  . diclofenac Sodium (VOLTAREN) 1 % GEL Apply topically.  . furosemide (LASIX) 20 MG tablet Take 1 tablet (20 mg total) by mouth daily.  Marland Kitchen glucose blood test strip OneTouch Ultra Test strips  USE TO TEST BLOOD SUGAR TWICE DAILY  . isosorbide mononitrate (IMDUR) 30 MG 24 hr tablet TAKE 1 TABLET(30 MG) BY MOUTH DAILY  . Lancets (ONETOUCH DELICA PLUS [id number]) MISC USE AS DIRECTED TO TEST BLOOD SUGAR TWICE DAILY  . levothyroxine (SYNTHROID) 50 MCG tablet TAKE 1 TABLET(50 MCG) BY MOUTH DAILY  . losartan (COZAAR) 100 MG tablet TAKE 1 TABLET(100 MG) BY MOUTH DAILY  . metFORMIN (GLUCOPHAGE) 500 MG tablet TAKE 1/2 TABLET(250 MG) BY MOUTH DAILY WITH LARGEST MEAL  . MULTIPLE VITAMIN PO See admin instructions.  Glory Rosebush ULTRA test strip USE TO TEST BLOOD SUGAR TWICE DAILY  . potassium chloride (KLOR-CON 10) 10 MEQ tablet Take 3 tablets (30 mEq total) by mouth once for 1 dose.  . predniSONE (DELTASONE) 5 MG tablet Take 1 tablet by mouth daily.  . simvastatin (ZOCOR) 40 MG tablet 1 tablet in the evening  . vitamin B-12 (CYANOCOBALAMIN) 1000 MCG tablet Take 1,000 mcg by mouth daily.   No facility-administered encounter medications on file as of 09/11/2020.    Have you had any problems recently with your health? Spoke with patient daughter who stated that she had to take her mother to see Dr. Quay Burow because of some swelling in her ankles that they thought it may be a blood clot. Informed by Dr, Quay Burow it was not a blood clot, but she prescribed her potassium and decreased her lasix to 20 mg daily  Have you had any problems with your pharmacy? She states that they do not have any problems with getting medications but that the colchine is a little expensive is there something else she can use   What issues or side effects are you having with your medications? She states that as far as she knows that the patient is not having any side effects from medications  What would you like me to pass along to [facility] for them to help you with?  She states that her mother is doing  ok, not to much concerned about anything else with her health or medications at this time  What can we do to take care of you better? She states if any changes in her mother's health she will call Dr. Quay Burow   Star Rating Drugs: Losartan 09/04/20 90 ds Simvastatin 06/17/20 90 ds  [place] Pharmacist Assistant [phone]  Time spent:21

## 2020-09-13 ENCOUNTER — Ambulatory Visit: Payer: Medicare Other | Admitting: Internal Medicine

## 2020-09-18 DIAGNOSIS — Z23 Encounter for immunization: Secondary | ICD-10-CM | POA: Diagnosis not present

## 2020-09-26 NOTE — Progress Notes (Signed)
A call was made to Sheila Morgan this morning to remind her of discussion with clinical pharmacist Mendel Ryder about [place]. Spoke with patient daughter Sheila Morgan who stated that the patient just received a refill for the colchicine but she has not had any gout flare ups.She stated that everything is so high that she was just looking to save on medications where she can. She stated that the patient does have an appointment with Dr. Kathlene November coming up and she will discuss with him then about switching patient to allopurinol.   Ethelene Hal Clinical Pharmacist Assistant [phone]  Time spent:10

## 2020-09-30 ENCOUNTER — Encounter: Payer: Self-pay | Admitting: Internal Medicine

## 2020-10-01 NOTE — Progress Notes (Signed)
    Chronic Care Management Pharmacy Assistant   Name: Sheila Morgan  MRN: [id number] DOB: April 04, 1933    Medications: Outpatient Encounter Medications as of 09/11/2020  Medication Sig  . acetaminophen (TYLENOL) 325 MG tablet Take 2 tablets (650 mg total) by mouth every 6 (six) hours as needed for mild pain, fever or headache.  . alendronate (FOSAMAX) 70 MG tablet Take 70 mg by mouth every Saturday. Take with a full glass of water on an empty stomach.  Marland Kitchen amLODipine (NORVASC) 5 MG tablet TAKE 1 AND 1/2 TABLETS(7.5 MG) BY MOUTH DAILY  . aspirin 81 MG EC tablet 1 tablet  . Calcium Carbonate-Vitamin D (CALCIUM 600-D PO) Take 1 tablet by mouth 2 (two) times daily.  . Calcium Carbonate-Vitamin D 600-400 MG-UNIT tablet 1 tablet with food  . carvedilol (COREG) 25 MG tablet 1 tablet PO BID with a meal  . colchicine 0.6 MG tablet Take 1 tablet by mouth every other day.  . diclofenac Sodium (VOLTAREN) 1 % GEL Apply topically.  . furosemide (LASIX) 20 MG tablet Take 1 tablet (20 mg total) by mouth daily.  Marland Kitchen glucose blood test strip OneTouch Ultra Test strips  USE TO TEST BLOOD SUGAR TWICE DAILY  . isosorbide mononitrate (IMDUR) 30 MG 24 hr tablet TAKE 1 TABLET(30 MG) BY MOUTH DAILY  . Lancets (ONETOUCH DELICA PLUS [id number]) MISC USE AS DIRECTED TO TEST BLOOD SUGAR TWICE DAILY  . levothyroxine (SYNTHROID) 50 MCG tablet TAKE 1 TABLET(50 MCG) BY MOUTH DAILY  . losartan (COZAAR) 100 MG tablet TAKE 1 TABLET(100 MG) BY MOUTH DAILY  . metFORMIN (GLUCOPHAGE) 500 MG tablet TAKE 1/2 TABLET(250 MG) BY MOUTH DAILY WITH LARGEST MEAL  . MULTIPLE VITAMIN PO See admin instructions.  Glory Rosebush ULTRA test strip USE TO TEST BLOOD SUGAR TWICE DAILY  . potassium chloride (KLOR-CON 10) 10 MEQ tablet Take 3 tablets (30 mEq total) by mouth once for 1 dose.  . predniSONE (DELTASONE) 5 MG tablet Take 1 tablet by mouth daily.  . simvastatin (ZOCOR) 40 MG tablet 1 tablet in the evening  . vitamin B-12 (CYANOCOBALAMIN)  1000 MCG tablet Take 1,000 mcg by mouth daily.   No facility-administered encounter medications on file as of 09/11/2020.    Pharmacist Review  Reviewed chart for medication changes and adherence.  No OVs, Consults, or hospital visits since last care coordination call / Pharmacist visit. No medication changes indicated  No gaps in adherence identified. Patient has follow up scheduled with pharmacy team. No further action required.  [place] Pharmacist Assistant [phone]  Time spent:6

## 2020-10-07 ENCOUNTER — Encounter: Payer: Self-pay | Admitting: Internal Medicine

## 2020-10-24 ENCOUNTER — Telehealth: Payer: Self-pay | Admitting: Cardiovascular Disease

## 2020-10-24 MED ORDER — SIMVASTATIN 40 MG PO TABS
ORAL_TABLET | ORAL | 0 refills | Status: DC
Start: 2020-10-24 — End: 2021-01-14

## 2020-10-24 NOTE — Telephone Encounter (Signed)
*  STAT* If patient is at the pharmacy, call can be transferred to refill team.   1. Which medications need to be refilled? (please list name of each medication and dose if known)  simvastatin (ZOCOR) 40 MG tablet  2. Which pharmacy/location (including street and city if local pharmacy) is medication to be sent to? [facility] [place], [place]  3. Do they need a 30 day or 90 day supply? 90  Patient is scheduled to see Dr. Gwenlyn Found to re-est care 12/31/20. Patient will need enough mediation to last until appt

## 2020-10-24 NOTE — Telephone Encounter (Signed)
Refills sent to pharmacy. 

## 2020-11-04 MED ORDER — ONETOUCH DELICA PLUS LANCET33G MISC: 5 refills | Status: DC

## 2020-11-08 ENCOUNTER — Encounter: Payer: Self-pay | Admitting: Internal Medicine

## 2020-11-08 NOTE — Telephone Encounter (Signed)
    Patients daughter called and said that the patients insurance needs a CMN form for Lancets (ONETOUCH DELICA PLUS [id number]) [place]. Please advise

## 2020-11-11 ENCOUNTER — Telehealth: Payer: Self-pay | Admitting: Podiatry

## 2020-11-11 NOTE — Telephone Encounter (Signed)
Pt left message when I was out of the office on 7.1 asking about status of diabetic shoes.   I returned call and left message they are scheduled to arrive this week and pt was already scheduled to see Dr Darnell Level on 7.20 and I just added her to the casing schedule.

## 2020-11-15 ENCOUNTER — Telehealth: Payer: Self-pay | Admitting: Pharmacist

## 2020-11-20 ENCOUNTER — Encounter: Payer: Self-pay | Admitting: Podiatry

## 2020-11-20 ENCOUNTER — Other Ambulatory Visit: Payer: Self-pay

## 2020-11-20 ENCOUNTER — Ambulatory Visit (INDEPENDENT_AMBULATORY_CARE_PROVIDER_SITE_OTHER): Payer: Medicare Other | Admitting: Podiatry

## 2020-11-20 DIAGNOSIS — I739 Peripheral vascular disease, unspecified: Secondary | ICD-10-CM | POA: Diagnosis not present

## 2020-11-20 DIAGNOSIS — L84 Corns and callosities: Secondary | ICD-10-CM

## 2020-11-20 DIAGNOSIS — E1142 Type 2 diabetes mellitus with diabetic polyneuropathy: Secondary | ICD-10-CM

## 2020-11-20 DIAGNOSIS — E1151 Type 2 diabetes mellitus with diabetic peripheral angiopathy without gangrene: Secondary | ICD-10-CM

## 2020-11-20 DIAGNOSIS — M79675 Pain in left toe(s): Secondary | ICD-10-CM | POA: Diagnosis not present

## 2020-11-20 DIAGNOSIS — M216X1 Other acquired deformities of right foot: Secondary | ICD-10-CM | POA: Diagnosis not present

## 2020-11-20 DIAGNOSIS — M79674 Pain in right toe(s): Secondary | ICD-10-CM | POA: Diagnosis not present

## 2020-11-20 DIAGNOSIS — M7989 Other specified soft tissue disorders: Secondary | ICD-10-CM | POA: Diagnosis not present

## 2020-11-20 DIAGNOSIS — E1342 Other specified diabetes mellitus with diabetic polyneuropathy: Secondary | ICD-10-CM

## 2020-11-20 DIAGNOSIS — B351 Tinea unguium: Secondary | ICD-10-CM | POA: Diagnosis not present

## 2020-11-20 DIAGNOSIS — E1140 Type 2 diabetes mellitus with diabetic neuropathy, unspecified: Secondary | ICD-10-CM | POA: Diagnosis not present

## 2020-11-20 NOTE — Progress Notes (Signed)
Patient presented to the office today to pick up diabetic shoes and 3 pair of diabetic custom inserts.  1 pair of inserts were put in the shoes and the shoes were fitted to the patient. The patient states they are comfortable and free of defect. She was satisfied with the fit of the shoe. Instructions for break in and wear were dispensed. The patient signed the delivery documentation and break in instruction form.  If any concerns or questions arise, she is instructed to call

## 2020-11-20 NOTE — Progress Notes (Signed)
Subjective:  Patient ID: Sheila Morgan, female    DOB: 01-Mar-1933,  MRN: [id number]  87 y.o. female presents with at risk foot care. Pt has h/o NIDDM with PAD and callus(es) of both feet and painful thick toenails that are difficult to trim. Painful toenails interfere with ambulation. Aggravating factors include wearing enclosed shoe gear. Pain is relieved with periodic professional debridement. Painful calluses are aggravated when weightbearing with and without shoegear. Pain is relieved with periodic professional debridement..    Patient's blood sugar was  111  mg/dl today.  Patient's daughter, Sheila Morgan, is present during today's visit. They are concerned about swelling in the right lower extremity which has been occurring on and off since Sheila Morgan was taken off of her Eliquis. Sheila Morgan states right leg is tender to touch. She also has mild swelling on the top of her left foot. Denies any fever, chills, night sweats, nausea or vomiting. Denies any chest pain or SOB. Daughter states she has discussed with PCP.  PCP: Sheila Rail, MD and last visit was: 09/10/2020  Review of Systems: Negative except as noted in the HPI.   No Known Allergies  Objective:  There were no vitals filed for this visit. Constitutional Patient is a pleasant 87 y.o. African American female WD, WN in NAD. AAO x 3.  Vascular Capillary refill time to digits immediate b/l. Palpable DP pulse(s) b/l lower extremities Nonpalpable PT pulse(s) b/l lower extremities. Pedal hair present. Lower extremity skin temperature gradient within normal limits. +Pain on palpation anterior aspect RLE. . Trace edema noted dorsum of left foot. +1 pitting edema right lower extremity. No cyanosis or clubbing noted. There is no increased warmth of  the left and right LE. No calf tightness b/l.   Neurologic Normal speech. Protective sensation diminished with 10g monofilament b/l. Vibratory sensation diminished b/l.  Dermatologic Pedal skin is  thin shiny, atrophic b/l lower extremities. No open wounds b/l lower extremities. No interdigital macerations b/l lower extremities. Toenails 1-5 b/l elongated, discolored, dystrophic, thickened, crumbly with subungual debris and tenderness to dorsal palpation. Hyperkeratotic lesion(s) submet head 1 right foot, submet head 5 left foot, and submet head 5 right foot.  No erythema, no edema, no drainage, no fluctuance.  Orthopedic: Normal muscle strength 5/5 to all lower extremity muscle groups bilaterally. Palpable exostosis noted midtarsal joint of both feet secondary to OA. Hammertoe(s) noted to the 2-5 bilaterally. She does have taut Achilles tendon of RLE. No palpable defect to indicate partial tear or rupture.    Hemoglobin A1C Latest Ref Rng & Units 09/10/2020 01/12/2020  HGBA1C 4.6 - 6.5 % 7.5(H) 6.8(H)  Some recent data might be hidden   Assessment:   1. Pain due to onychomycosis of toenails of both feet   2. Callus   3. Right leg swelling   4. Acquired equinus deformity of right foot   5. PAD (peripheral artery disease) ([place])   6. Diabetic peripheral neuropathy associated with type 2 diabetes mellitus ([place])    Plan:  Patient was evaluated and treated and all questions answered.  Onychomycosis with pain -Nails palliatively debridement as below. -Educated on self-care  Procedure: Nail Debridement Rationale: Pain Type of Debridement: manual, sharp debridement. Instrumentation: Nail nipper, rotary burr. Number of Nails: 10  -Examined patient. -We discussed her arthritis is in both feet and I do not feel this is related to her leg swelling of the right lower extremity. I had her daughter feel the boney prominences and she related understanding.  -  Patient does have unilateral edema of RLE when compared to LLE. We discussed her h/o DVT and venous dopplers would rule that out. Daughter has sent message to Dr. Kennon Holter office and will follow up with him . -Patient to continue soft,  supportive shoe gear daily. -For her tight Achilles, we did provide a heel lift for her right shoe. She is also picking up her diabetic shoes on today.  -Toenails 1-5 b/l were debrided in length and girth with sterile nail nippers and dremel without iatrogenic bleeding.  -Callus(es) submet head 1 right foot, submet head 5 left foot, and submet head 5 right foot pared utilizing sterile scalpel blade without complication or incident. Total number debrided =3. -Patient to report any pedal injuries to medical professional immediately. -Patient/POA to call should there be question/concern in the interim.  Return in about 3 months (around 02/20/2021).  Marzetta Board, DPM

## 2020-11-22 NOTE — Progress Notes (Signed)
Chronic Care Management Pharmacy Assistant   Name: Sheila Morgan  MRN: [id number] DOB: Sep 30, 1932   Reason for Encounter: Disease State   Conditions to be addressed/monitored: General  Recent office visits:  None ID  Recent consult visits:  11/20/20 Acquanetta Sit DPM, Pain due to onychomycosis of toenails of both feet Callus, Right leg swelling   Hospital visits:  None in previous 6 months  Medications: Outpatient Encounter Medications as of 11/15/2020  Medication Sig   acetaminophen (TYLENOL) 325 MG tablet Take 2 tablets (650 mg total) by mouth every 6 (six) hours as needed for mild pain, fever or headache.   alendronate (FOSAMAX) 70 MG tablet Take 70 mg by mouth every Saturday. Take with a full glass of water on an empty stomach.   amLODipine (NORVASC) 5 MG tablet TAKE 1 AND 1/2 TABLETS(7.5 MG) BY MOUTH DAILY   aspirin 81 MG EC tablet 1 tablet   Calcium Carbonate-Vitamin D (CALCIUM 600-D PO) Take 1 tablet by mouth 2 (two) times daily.   Calcium Carbonate-Vitamin D 600-400 MG-UNIT tablet 1 tablet with food   carvedilol (COREG) 25 MG tablet 1 tablet PO BID with a meal   colchicine 0.6 MG tablet Take 1 tablet by mouth every other day.   diclofenac Sodium (VOLTAREN) 1 % GEL Apply topically.   furosemide (LASIX) 20 MG tablet Take 1 tablet (20 mg total) by mouth daily.   glucose blood test strip OneTouch Ultra Test strips  USE TO TEST BLOOD SUGAR TWICE DAILY   isosorbide mononitrate (IMDUR) 30 MG 24 hr tablet TAKE 1 TABLET(30 MG) BY MOUTH DAILY   Lancets (ONETOUCH DELICA PLUS 123XX123) MISC USE AS DIRECTED TO TEST BLOOD SUGAR TWICE DAILY   levothyroxine (SYNTHROID) 50 MCG tablet TAKE 1 TABLET(50 MCG) BY MOUTH DAILY   losartan (COZAAR) 100 MG tablet TAKE 1 TABLET(100 MG) BY MOUTH DAILY   metFORMIN (GLUCOPHAGE) 500 MG tablet TAKE 1/2 TABLET(250 MG) BY MOUTH DAILY WITH LARGEST MEAL   MULTIPLE VITAMIN PO See admin instructions.   ONETOUCH ULTRA test strip USE TO TEST BLOOD  SUGAR TWICE DAILY   potassium chloride (KLOR-CON 10) 10 MEQ tablet Take 3 tablets (30 mEq total) by mouth once for 1 dose.   predniSONE (DELTASONE) 5 MG tablet Take 1 tablet by mouth daily.   simvastatin (ZOCOR) 40 MG tablet 1 tablet in the evening   vitamin B-12 (CYANOCOBALAMIN) 1000 MCG tablet Take 1,000 mcg by mouth daily.   No facility-administered encounter medications on file as of 11/15/2020.    Pharmacist Review  Have you had any problems recently with your health? Spoke with patient daughter Sheila Morgan who stated that her mother is still having problems with her legs, ankles and feet swelling. She states that her mother complains of her legs feeling tender and sore. She is taking lasix 20 mg but thinks it may need to be increased  Have you had any problems with your pharmacy? Daughter states that the only issue they have with her medications is that they are having a hard time getting her lancets to check her blood sugar. Daughter states that she has had to buy them over the counter but feels like should should not have to. She would like some help in getting the lancets from the pharmacy  What issues or side effects are you having with your medications? Daughter states that patient is not having any side effects that she knows of  What would you like me to pass along to [place] for them to help you with?  Daughter states that since her mother gets supplemental insurance it should help pay for her lancets. She would like for Mendel Ryder to help with this   What can we do to take care of you better?  Daughter states she appreciate the call to check on her mother  Star Rating Drugs: Simvastatin 40 mg 10/24/20 90 ds Metfromin 500 mg 09/11/20 90 ds Losartan 100 mg 09/04/20 90 ds   Ethelene Hal Clinical Pharmacist Assistant [phone]   Time spent:33

## 2020-11-25 ENCOUNTER — Other Ambulatory Visit: Payer: Self-pay | Admitting: Internal Medicine

## 2020-12-06 ENCOUNTER — Telehealth: Payer: Self-pay

## 2020-12-06 NOTE — Telephone Encounter (Signed)
Faxed in today. 

## 2020-12-06 NOTE — Telephone Encounter (Signed)
-----   Message from [place] sent at 12/05/2020 10:03 AM EDT ----- Refill request

## 2020-12-09 NOTE — Progress Notes (Addendum)
Cardiology Office Note:    Date:  12/10/2020   ID:  Sheila Morgan, DOB 1933/01/02, MRN [id number]  PCP:  Binnie Rail, MD Referring MD: Binnie Rail, MD    [facility]  Cardiologist:  Quay Burow, MD   Reason for visit: Leg swelling  History of Present Illness:    Sheila Morgan is a 87 y.o. female with a hx of CAD status post remote 1-vessel coronary artery bypass grafting with a LIMA to her LAD in 1994. [place] November of 2002 revealed a patent LIMA to the LAD with high-grade distal RCA disease s/p placement of 2.75 x 8 mm long BX Philosophy bare metal stent. Her other problems include hypertension, hyperlipidemia, non-insulin-requiring diabetes.    Patient's daughter called our office in July 2022 with concern about leg swelling with a history of blood clot in the past.  Patient was previously on Xarelto but was taken off by her PCP in March 2022.  Her last lower extremity ultrasound was in 12/2019 and showed limited compressibility of the left superficial femoral vein which was thought possibility to be a DVT.  She last saw Dr. Alvester Chou in December 2018 and was doing well without angina.  Her LDL had increased from 83 to 132 so her simvastatin was restarted.  Her daughter accompanies the patient today.  She states she brought her mother in for intermittent leg swelling since March.  She states it will often come on for 2 weeks and then gradually dissipate.  The patient does not use compression stockings.  She has her food through [employer].  They do mention Lasix decreased from 40 mg to 20 mg in March.  Otherwise the patient had infrequent mild chest pain that lasts seconds and is not related to exertion.  Denies shortness of breath.  Has occasional heart fluttering, none today. She states she stays lightheaded.  Denies syncope.  She lives alone though is visited by her son twice daily and her daughter once a day.  They also have a home health aide that visits  6 days a week to help with house chores.  The patient uses a walker for ambulation.  She denies PND, orthopnea, bloating and weight gain.  No bleeding issues.   Past Medical History:  Diagnosis Date   Arthritis    Cancer ([place])    skin on nose .  mylenoma   Coronary artery disease    status post post LAD angioplasty in 1993  and subsequent coronary artery bypass grafting x1 with a LIMA to the LAD July of 1994. She had stenting of her RCA with a bare-metal stent November 2002.   Diabetes mellitus    2   Gout    Heart murmur    Hematoma    Right groin   Hemorrhoids    Hiatal hernia    History of blood transfusion    HOH (hard of hearing)    Hyperlipidemia    Hypertension    Left knee DJD    Lightheadedness    has seen Dr Gwenlyn Found and ENT.. cant find out why   Myocardial infarction [facility])    Osteoporosis    PONV (postoperative nausea and vomiting)    Shingles 10/27/2013    Past Surgical History:  Procedure Laterality Date   CORONARY ANGIOPLASTY  1993   LAD   CORONARY ANGIOPLASTY WITH STENT PLACEMENT  03/09/01   RCA   CORONARY ARTERY BYPASS GRAFT  1994   JOINT  REPLACEMENT Right 1998   hip   NM MYOVIEW LTD  03/2012   Normal   SKIN GRAFT     to nose skin cancer   TOTAL HIP ARTHROPLASTY     TOTAL KNEE ARTHROPLASTY Left 10/09/2013   Procedure: TOTAL KNEE ARTHROPLASTY;  Surgeon: Lorn Junes, MD;  Location: [facility];  Service: Orthopedics;  Laterality: Left;    Current Medications: Current Meds  Medication Sig   acetaminophen (TYLENOL) 325 MG tablet Take 2 tablets (650 mg total) by mouth every 6 (six) hours as needed for mild pain, fever or headache.   alendronate (FOSAMAX) 70 MG tablet Take 70 mg by mouth every Saturday. Take with a full glass of water on an empty stomach.   amLODipine (NORVASC) 5 MG tablet TAKE 1 AND 1/2 TABLETS(7.5 MG) BY MOUTH DAILY   Calcium Carbonate-Vitamin D (CALCIUM 600-D PO) Take 1 tablet by mouth 2 (two) times daily.   Calcium Carbonate-Vitamin D  600-400 MG-UNIT tablet 1 tablet with food   carvedilol (COREG) 25 MG tablet TAKE 1 TABLET BY MOUTH TWICE DAILY WITH A MEAL   colchicine 0.6 MG tablet Take 1 tablet by mouth every other day.   diclofenac Sodium (VOLTAREN) 1 % GEL Apply topically.   furosemide (LASIX) 20 MG tablet Take 1 tablet (20 mg total) by mouth daily.   glucose blood test strip OneTouch Ultra Test strips  USE TO TEST BLOOD SUGAR TWICE DAILY   isosorbide mononitrate (IMDUR) 30 MG 24 hr tablet TAKE 1 TABLET(30 MG) BY MOUTH DAILY   Lancets (ONETOUCH DELICA PLUS 123XX123) MISC USE AS DIRECTED TO TEST BLOOD SUGAR TWICE DAILY   levothyroxine (SYNTHROID) 50 MCG tablet TAKE 1 TABLET(50 MCG) BY MOUTH DAILY   losartan (COZAAR) 100 MG tablet TAKE 1 TABLET(100 MG) BY MOUTH DAILY   metFORMIN (GLUCOPHAGE) 500 MG tablet TAKE 1/2 TABLET(250 MG) BY MOUTH DAILY WITH LARGEST MEAL   MULTIPLE VITAMIN PO See admin instructions.   ONETOUCH ULTRA test strip USE TO TEST BLOOD SUGAR TWICE DAILY   predniSONE (DELTASONE) 5 MG tablet Take 1 tablet by mouth daily.   rivaroxaban (XARELTO) 20 MG TABS tablet Take 1 tablet (20 mg total) by mouth daily with supper.   simvastatin (ZOCOR) 40 MG tablet 1 tablet in the evening   vitamin B-12 (CYANOCOBALAMIN) 1000 MCG tablet Take 1,000 mcg by mouth daily.   [DISCONTINUED] aspirin 81 MG EC tablet 1 tablet     Allergies:   Patient has no known allergies.   Social History   Socioeconomic History   Marital status: Widowed    Spouse name: Not on file   Number of children: 2   Years of education: Not on file   Highest education level: 12th grade  Occupational History   Occupation: Retired   Tobacco Use   Smoking status: Never   Smokeless tobacco: Never  Vaping Use   Vaping Use: Never used  Substance and Sexual Activity   Alcohol use: No   Drug use: No   Sexual activity: Never  Other Topics Concern   Not on file  Social History Narrative   Left handed    Caffeine 4 cups daily    Lives at home  alone    Social Determinants of Health   Financial Resource Strain: Low Risk    Difficulty of Paying Living Expenses: Not hard at all  Food Insecurity: No Food Insecurity   Worried About [place] in the Last Year: Never true   Ran  Out of Food in the Last Year: Never true  Transportation Needs: No Transportation Needs   Lack of Transportation (Medical): No   Lack of Transportation (Non-Medical): No  Physical Activity: Insufficiently Active   Days of Exercise per Week: 3 days   Minutes of Exercise per Session: 20 min  Stress: No Stress Concern Present   Feeling of Stress : Not at all  Social Connections: Unknown   Frequency of Communication with Friends and Family: More than three times a week   Frequency of Social Gatherings with Friends and Family: More than three times a week   Attends Religious Services: Patient refused   Marine scientist or Organizations: Patient refused   Attends Archivist Meetings: Patient refused   Marital Status: Patient refused     Family History: The patient's family history includes Cancer in her sister; Diabetes in her sister and sister; Heart disease in her brother, brother, brother, and father; Stroke in her mother.  ROS:   Please see the history of present illness.     EKGs/Labs/Other Studies Reviewed:    EKG:  The ekg ordered today demonstrates atrial fibrillation, heart rate 67, QRS 74 ms, nonspecific T wave abnormality, QT 400 ms, QTC 422 ms.  Recent Labs: 09/10/2020: ALT 17; BUN 17; Creatinine, Ser 1.26; Hemoglobin 13.0; Platelets 184.0; Potassium 3.0; Sodium 141; TSH 2.10  Recent Lipid Panel    Component Value Date/Time   CHOL 133 09/10/2020 1115   CHOL 196 06/17/2017 0838   TRIG 191.0 (H) 09/10/2020 1115   HDL 56.90 09/10/2020 1115   HDL 82 06/17/2017 0838   CHOLHDL 2 09/10/2020 1115   VLDL 38.2 09/10/2020 1115   LDLCALC 38 09/10/2020 1115   LDLCALC 60 01/12/2020 1011    Physical Exam:    VS:  BP  134/82   Pulse 67   Ht '5\' 6"'$  (1.676 m)   Wt 153 lb (69.4 kg)   SpO2 93%   BMI 24.69 kg/m     Wt Readings from Last 3 Encounters:  12/10/20 153 lb (69.4 kg)  09/10/20 160 lb 3.2 oz (72.7 kg)  02/12/20 157 lb 14.4 oz (71.6 kg)     GEN:  Well nourished, well developed in no acute distress, elderly HEENT: Normal NECK: No JVD; No carotid bruits CARDIAC: irreg irreg, no murmurs, rubs, gallops RESPIRATORY:  Clear to auscultation without rales, wheezing or rhonchi  ABDOMEN: Soft, non-tender, non-distended MUSCULOSKELETAL: Minimal bilateral edema below shins, R>L; No deformity  SKIN: Warm and dry NEUROLOGIC:  Alert and oriented PSYCHIATRIC:  Normal affect   ASSESSMENT AND PLAN   Leg swelling, minimal -Possibility worsened from new diagnosis of atrial fibrillation and the loss of the atrial kick -Amlodipine could be contributing to leg swelling but her daughter states she has been on for a long time. -Check 2D echo -Recommend compression stockings -If leg swelling is not improved at the next visit, could consider increasing Lasix back to 40 mg daily.  Atrial fibrillation, rate controlled -New diagnosis -Check TSH, BMET, Mag, CBC -Continue Coreg for rate control -Stop aspirin and start Xarelto for stroke prophylaxis.  Her CHA2DS2-VASc is 7.  She has been on Xarelto in the past for history of DVT and had no bleeding issues -Obtain 2D echo -After 2D echo is resulted, discussed further management with Dr. Gwenlyn Found.    CAD -Mentions atypical chest pain  -continue statin, beta-blocker and Imdur -Stop aspirin due to need for anticoagulation  Hypertension, reasonably controlled -Continue amlodipine, carvedilol and  losartan. - Goal BP is <130/80.  Recommend DASH diet (high in vegetables, fruits, low-fat dairy products, whole grains, poultry, fish, and nuts and low in sweets, sugar-sweetened beverages, and red meats), salt restriction and increase physical activity.  Hyperlipidemia -LDL  38 in May 2022. -With interaction with amlodipine, she should decrease her simvastatin dose to '20mg'$  daily.   - Discussed cholesterol lowering diets - Mediterranean diet, DASH diet, vegetarian diet, low-carbohydrate diet and avoidance of trans fats.  Discussed healthier choice substitutes.  Nuts, high-fiber foods, and fiber supplements may also improve lipids.    Disposition - Follow-up with Dr. Gwenlyn Found as scheduled.      Medication Adjustments/Labs and Tests Ordered: Current medicines are reviewed at length with the patient today.  Concerns regarding medicines are outlined above.  Orders Placed This Encounter  Procedures   Basic metabolic panel   CBC   TSH   Magnesium   EKG 12-Lead   ECHOCARDIOGRAM COMPLETE    Meds ordered this encounter  Medications   rivaroxaban (XARELTO) 20 MG TABS tablet    Sig: Take 1 tablet (20 mg total) by mouth daily with supper.    Dispense:  30 tablet    Refill:  6     Patient Instructions  Medication Instructions:  Stop Aspirin. Start Xarelto 20 mg (1 Tablet Daily) *If you need a refill on your cardiac medications before your next appointment, please call your pharmacy*   Lab Work: BMET, CBC, Magnesium, TSH (Today) If you have labs (blood work) drawn today and your tests are completely normal, you will receive your results only by: [place] (if you have MyChart) OR A paper copy in the mail If you have any lab test that is abnormal or we need to change your treatment, we will call you to review the results.   Testing/Procedures: [address]. Your physician has requested that you have an echocardiogram. Echocardiography is a painless test that uses sound waves to create images of your heart. It provides your doctor with information about the size and shape of your heart and how well your heart's chambers and valves are working. This procedure takes approximately one hour. There are no restrictions for this procedure.     Follow-Up: At [facility], you and your health needs are our priority.  As part of our continuing mission to provide you with exceptional heart care, we have created designated Provider Care Teams.  These Care Teams include your primary Cardiologist (physician) and Advanced Practice Providers (APPs -  Physician Assistants and Nurse Practitioners) who all work together to provide you with the care you need, when you need it.   Your next appointment:   December 31, 2020 9:30 AM  The format for your next appointment:   In Person  Provider:   Quay Burow, MD   Other Instructions Recommended to wear Compression Stockings.    Signed, Warren Lacy, PA-C  12/10/2020 12:14 PM    [facility]

## 2020-12-10 ENCOUNTER — Other Ambulatory Visit: Payer: Self-pay

## 2020-12-10 ENCOUNTER — Encounter: Payer: Self-pay | Admitting: Physician Assistant

## 2020-12-10 ENCOUNTER — Ambulatory Visit (INDEPENDENT_AMBULATORY_CARE_PROVIDER_SITE_OTHER): Payer: Medicare Other | Admitting: Physician Assistant

## 2020-12-10 VITALS — BP 134/82 | HR 67 | Ht 66.0 in | Wt 153.0 lb

## 2020-12-10 DIAGNOSIS — I10 Essential (primary) hypertension: Secondary | ICD-10-CM | POA: Diagnosis not present

## 2020-12-10 DIAGNOSIS — M7989 Other specified soft tissue disorders: Secondary | ICD-10-CM | POA: Diagnosis not present

## 2020-12-10 DIAGNOSIS — I2510 Atherosclerotic heart disease of native coronary artery without angina pectoris: Secondary | ICD-10-CM | POA: Diagnosis not present

## 2020-12-10 DIAGNOSIS — I4891 Unspecified atrial fibrillation: Secondary | ICD-10-CM | POA: Diagnosis not present

## 2020-12-10 DIAGNOSIS — R600 Localized edema: Secondary | ICD-10-CM

## 2020-12-10 DIAGNOSIS — E782 Mixed hyperlipidemia: Secondary | ICD-10-CM | POA: Diagnosis not present

## 2020-12-10 LAB — BASIC METABOLIC PANEL
BUN/Creatinine Ratio: 11 — ABNORMAL LOW (ref 12–28)
BUN: 12 mg/dL (ref 8–27)
CO2: 25 mmol/L (ref 20–29)
Calcium: 10 mg/dL (ref 8.7–10.3)
Chloride: 102 mmol/L (ref 96–106)
Creatinine, Ser: 1.12 mg/dL — ABNORMAL HIGH (ref 0.57–1.00)
Glucose: 129 mg/dL — ABNORMAL HIGH (ref 65–99)
Potassium: 4.1 mmol/L (ref 3.5–5.2)
Sodium: 143 mmol/L (ref 134–144)
eGFR: 48 mL/min/{1.73_m2} — ABNORMAL LOW (ref >59)

## 2020-12-10 LAB — CBC
Hematocrit: 42.8 % (ref 34.0–46.6)
Hemoglobin: 14.3 g/dL (ref 11.1–15.9)
MCH: 30 pg (ref 26.6–33.0)
MCHC: 33.4 g/dL (ref 31.5–35.7)
MCV: 90 fL (ref 79–97)
Platelets: 213 10*3/uL (ref 150–450)
RBC: 4.77 x10E6/uL (ref 3.77–5.28)
RDW: 13.6 % (ref 11.7–15.4)
WBC: 11.3 10*3/uL — ABNORMAL HIGH (ref 3.4–10.8)

## 2020-12-10 LAB — TSH: TSH: 2.35 u[IU]/mL (ref 0.450–4.500)

## 2020-12-10 LAB — MAGNESIUM: Magnesium: 2.1 mg/dL (ref 1.6–2.3)

## 2020-12-10 MED ORDER — RIVAROXABAN 20 MG PO TABS: 20.0000 mg | ORAL_TABLET | Freq: Every day | ORAL | 6 refills | Status: DC

## 2020-12-10 NOTE — Patient Instructions (Signed)
Medication Instructions:  Stop Aspirin. Start Xarelto 20 mg (1 Tablet Daily) *If you need a refill on your cardiac medications before your next appointment, please call your pharmacy*   Lab Work: BMET, CBC, Magnesium, TSH (Today) If you have labs (blood work) drawn today and your tests are completely normal, you will receive your results only by: [place] (if you have MyChart) OR A paper copy in the mail If you have any lab test that is abnormal or we need to change your treatment, we will call you to review the results.   Testing/Procedures: [address], [address]. Your physician has requested that you have an echocardiogram. Echocardiography is a painless test that uses sound waves to create images of your heart. It provides your doctor with information about the size and shape of your heart and how well your heart's chambers and valves are working. This procedure takes approximately one hour. There are no restrictions for this procedure.    Follow-Up: At [facility], you and your health needs are our priority.  As part of our continuing mission to provide you with exceptional heart care, we have created designated Provider Care Teams.  These Care Teams include your primary Cardiologist (physician) and Advanced Practice Providers (APPs -  Physician Assistants and Nurse Practitioners) who all work together to provide you with the care you need, when you need it.   Your next appointment:   December 31, 2020 9:30 AM  The format for your next appointment:   In Person  Provider:   Quay Burow, MD   Other Instructions Recommended to wear Compression Stockings.

## 2020-12-19 ENCOUNTER — Telehealth: Payer: Self-pay

## 2020-12-19 NOTE — Telephone Encounter (Addendum)
Left message for patient to Call [facility] ----- Message from Warren Lacy, PA-C sent at 12/18/2020  9:29 PM EDT ----- Bloodwork looks good.  Your thyroid and electrolyte levels are normal.  Your blood counts and kidney function are stable.  There is nothing in your bloodwork to account for your irregular heartbeat.  We will follow-up on your heart ultrasound.

## 2020-12-27 ENCOUNTER — Ambulatory Visit (HOSPITAL_COMMUNITY): Payer: Medicare Other | Attending: Internal Medicine

## 2020-12-27 ENCOUNTER — Other Ambulatory Visit: Payer: Self-pay

## 2020-12-27 ENCOUNTER — Telehealth: Payer: Self-pay

## 2020-12-27 DIAGNOSIS — I10 Essential (primary) hypertension: Secondary | ICD-10-CM | POA: Insufficient documentation

## 2020-12-27 DIAGNOSIS — I4891 Unspecified atrial fibrillation: Secondary | ICD-10-CM | POA: Diagnosis not present

## 2020-12-27 DIAGNOSIS — M7989 Other specified soft tissue disorders: Secondary | ICD-10-CM | POA: Insufficient documentation

## 2020-12-27 DIAGNOSIS — E782 Mixed hyperlipidemia: Secondary | ICD-10-CM | POA: Diagnosis not present

## 2020-12-27 DIAGNOSIS — I2510 Atherosclerotic heart disease of native coronary artery without angina pectoris: Secondary | ICD-10-CM | POA: Diagnosis not present

## 2020-12-27 LAB — ECHOCARDIOGRAM COMPLETE
Area-P 1/2: 4.21 cm2
S' Lateral: 2.8 cm

## 2020-12-27 MED ORDER — PERFLUTREN LIPID MICROSPHERE
1.0000 mL | INTRAVENOUS | Status: AC | PRN
  Administered 2020-12-27: 2 mL via INTRAVENOUS

## 2020-12-27 NOTE — Telephone Encounter (Addendum)
Left Detailed message advising patient Lab work normal. ----- Message from Warren Lacy, PA-C sent at 12/18/2020  9:29 PM EDT ----- Bloodwork looks good.  Your thyroid and electrolyte levels are normal.  Your blood counts and kidney function are stable.  There is nothing in your bloodwork to account for your irregular heartbeat.  We will follow-up on your heart ultrasound.

## 2020-12-31 ENCOUNTER — Ambulatory Visit (INDEPENDENT_AMBULATORY_CARE_PROVIDER_SITE_OTHER): Payer: Medicare Other | Admitting: Cardiovascular Disease

## 2020-12-31 ENCOUNTER — Other Ambulatory Visit: Payer: Self-pay

## 2020-12-31 ENCOUNTER — Encounter: Payer: Self-pay | Admitting: Cardiovascular Disease

## 2020-12-31 DIAGNOSIS — E782 Mixed hyperlipidemia: Secondary | ICD-10-CM | POA: Diagnosis not present

## 2020-12-31 DIAGNOSIS — I2510 Atherosclerotic heart disease of native coronary artery without angina pectoris: Secondary | ICD-10-CM | POA: Diagnosis not present

## 2020-12-31 DIAGNOSIS — I480 Paroxysmal atrial fibrillation: Secondary | ICD-10-CM | POA: Insufficient documentation

## 2020-12-31 DIAGNOSIS — Z951 Presence of aortocoronary bypass graft: Secondary | ICD-10-CM | POA: Diagnosis not present

## 2020-12-31 DIAGNOSIS — I2583 Coronary atherosclerosis due to lipid rich plaque: Secondary | ICD-10-CM | POA: Diagnosis not present

## 2020-12-31 DIAGNOSIS — I4891 Unspecified atrial fibrillation: Secondary | ICD-10-CM | POA: Insufficient documentation

## 2020-12-31 DIAGNOSIS — M7989 Other specified soft tissue disorders: Secondary | ICD-10-CM

## 2020-12-31 DIAGNOSIS — I10 Essential (primary) hypertension: Secondary | ICD-10-CM | POA: Diagnosis not present

## 2020-12-31 NOTE — Assessment & Plan Note (Signed)
History of essential hypertension a blood pressure measured today at 168/76.  She is on amlodipine, carvedilol and losartan.  I am going to have her keep a 30-day blood pressure log and see a Pharm.D. back to review.  She may need additional antihypertensive medications.

## 2020-12-31 NOTE — Assessment & Plan Note (Signed)
On furosemide with recent 2D echo that showed normal LV systolic function with grade 1 diastolic dysfunction.  I suspect her swelling is related to diastolic dysfunction plus or minus dietary discretion with regards to salt.  She has trace lower extreme extremity edema on exam today.

## 2020-12-31 NOTE — Patient Instructions (Signed)
Medication Instructions:  The current medical regimen is effective;  continue present plan and medications.  *If you need a refill on your cardiac medications before your next appointment, please call your pharmacy*   Follow-Up: At [facility], you and your health needs are our priority.  As part of our continuing mission to provide you with exceptional heart care, we have created designated Provider Care Teams.  These Care Teams include your primary Cardiologist (physician) and Advanced Practice Providers (APPs -  Physician Assistants and Nurse Practitioners) who all work together to provide you with the care you need, when you need it.  We recommend signing up for the patient portal called "MyChart".  Sign up information is provided on this After Visit Summary.  MyChart is used to connect with patients for Virtual Visits (Telemedicine).  Patients are able to view lab/test results, encounter notes, upcoming appointments, etc.  Non-urgent messages can be sent to your provider as well.   To learn more about what you can do with MyChart, go to [email or web address].    Your next appointment:   6 month(s)  The format for your next appointment:   In Person  Provider:   You will see one of the following Advanced Practice Providers on your designated Care Team:   Caron Presume, PA-C  Then, Quay Burow, MD will plan to see you again in 12 month(s).   Other Instructions SEE PHARMD BACK IN 1 MONTH- take BP log for 30 days (bring this with you when you come see pharmacy)

## 2020-12-31 NOTE — Progress Notes (Signed)
12/31/2020 Sheila Morgan   05-17-1932  [id number]  Primary Physician Binnie Rail, MD Primary Cardiologist: Lorretta Harp MD FACP, [place], [place], [place]  HPI:  Sheila Morgan is a 87 y.o.  mildly overweight married [place] American female mother of 2, grandmother to 4 grandchildren who I last saw 04/20/2017.  She is accompanied by one of her daughters [name] today... I took care of her husband as well, who unfortunately passed away in 2014/07/27 after they've had been married for 54 years. She has a history of CAD status post remote 1-vessel coronary artery bypass grafting with a LIMA to her LAD in 1992/07/26. I re-catheterized her in November of 2002 revealing a patent LIMA to the LAD with high-grade distal RCA disease which I stented using a 2.75 x 8 mm long BX [employer] bare metal stent. Her other problems include hypertension, hyperlipidemia, non-insulin-requiring diabetes.  She had a Myoview stress test performed March 14, 2012 which was entirely normal.   Since I saw her 4 years ago she did see Caron Presume, PA-C on 12/10/2020 with lower extremity edema.  She was on furosemide 40 mg a day which backed down to 20.  She eats prepared foods every day and I suspect she has dietary noncompliance with regards to salt.  She denies chest pain or shortness of breath.  Current Meds  Medication Sig   acetaminophen (TYLENOL) 325 MG tablet Take 2 tablets (650 mg total) by mouth every 6 (six) hours as needed for mild pain, fever or headache.   alendronate (FOSAMAX) 70 MG tablet Take 70 mg by mouth every Saturday. Take with a full glass of water on an empty stomach.   amLODipine (NORVASC) 5 MG tablet TAKE 1 AND 1/2 TABLETS(7.5 MG) BY MOUTH DAILY   Calcium Carbonate-Vitamin D (CALCIUM 600-D PO) Take 1 tablet by mouth 2 (two) times daily.   Calcium Carbonate-Vitamin D 600-400 MG-UNIT tablet 1 tablet with food   carvedilol (COREG) 25 MG tablet TAKE 1 TABLET BY MOUTH TWICE DAILY WITH A MEAL   colchicine  0.6 MG tablet Take 1 tablet by mouth every other day.   furosemide (LASIX) 20 MG tablet Take 1 tablet (20 mg total) by mouth daily.   glucose blood test strip OneTouch Ultra Test strips  USE TO TEST BLOOD SUGAR TWICE DAILY   isosorbide mononitrate (IMDUR) 30 MG 24 hr tablet TAKE 1 TABLET(30 MG) BY MOUTH DAILY   Lancets (ONETOUCH DELICA PLUS 123XX123) MISC USE AS DIRECTED TO TEST BLOOD SUGAR TWICE DAILY   levothyroxine (SYNTHROID) 50 MCG tablet TAKE 1 TABLET(50 MCG) BY MOUTH DAILY   losartan (COZAAR) 100 MG tablet TAKE 1 TABLET(100 MG) BY MOUTH DAILY   metFORMIN (GLUCOPHAGE) 500 MG tablet TAKE 1/2 TABLET(250 MG) BY MOUTH DAILY WITH LARGEST MEAL   MULTIPLE VITAMIN PO See admin instructions.   ONETOUCH ULTRA test strip USE TO TEST BLOOD SUGAR TWICE DAILY   rivaroxaban (XARELTO) 20 MG TABS tablet Take 1 tablet (20 mg total) by mouth daily with supper.   simvastatin (ZOCOR) 40 MG tablet 1 tablet in the evening (Patient taking differently: 20 mg. 1/2 tablet in the evening TOTAL 20 MG)   vitamin B-12 (CYANOCOBALAMIN) 1000 MCG tablet Take 1,000 mcg by mouth daily.     No Known Allergies  Social History   Socioeconomic History   Marital status: Widowed    Spouse name: Not on file   Number of children: 2   Years of education: Not on file  Highest education level: 12th grade  Occupational History   Occupation: Retired   Tobacco Use   Smoking status: Never   Smokeless tobacco: Never  Vaping Use   Vaping Use: Never used  Substance and Sexual Activity   Alcohol use: No   Drug use: No   Sexual activity: Never  Other Topics Concern   Not on file  Social History Narrative   Left handed    Caffeine 4 cups daily    Lives at home alone    Social Determinants of Health   Financial Resource Strain: Low Risk    Difficulty of Paying Living Expenses: Not hard at all  Food Insecurity: No Food Insecurity   Worried About Charity fundraiser in the Last Year: Never true   [place] in  the Last Year: Never true  Transportation Needs: No Transportation Needs   Lack of Transportation (Medical): No   Lack of Transportation (Non-Medical): No  Physical Activity: Insufficiently Active   Days of Exercise per Week: 3 days   Minutes of Exercise per Session: 20 min  Stress: No Stress Concern Present   Feeling of Stress : Not at all  Social Connections: Unknown   Frequency of Communication with Friends and Family: More than three times a week   Frequency of Social Gatherings with Friends and Family: More than three times a week   Attends Religious Services: Patient refused   Marine scientist or Organizations: Patient refused   Attends Archivist Meetings: Patient refused   Marital Status: Patient refused  Intimate Partner Violence: Not on file     Review of Systems: General: negative for chills, fever, night sweats or weight changes.  Cardiovascular: negative for chest pain, dyspnea on exertion, edema, orthopnea, palpitations, paroxysmal nocturnal dyspnea or shortness of breath Dermatological: negative for rash Respiratory: negative for cough or wheezing Urologic: negative for hematuria Abdominal: negative for nausea, vomiting, diarrhea, bright red blood per rectum, melena, or hematemesis Neurologic: negative for visual changes, syncope, or dizziness All other systems reviewed and are otherwise negative except as noted above.    Blood pressure (!) 168/76, pulse 78, height '5\' 6"'$  (1.676 m), weight 157 lb (71.2 kg), SpO2 97 %.  General appearance: alert and no distress Neck: no adenopathy, no carotid bruit, no JVD, supple, symmetrical, trachea midline, and thyroid not enlarged, symmetric, no tenderness/mass/nodules Lungs: clear to auscultation bilaterally Heart: regular rate and rhythm, S1, S2 normal, no murmur, click, rub or gallop Extremities: Trace lower extremity edema Pulses: 2+ and symmetric Skin: Skin color, texture, turgor normal. No rashes or  lesions Neurologic: Grossly normal  EKG sinus rhythm at 78 with PACs.  I personally reviewed this EKG.  ASSESSMENT AND PLAN:   Hyperlipidemia History of hyperlipidemia on statin therapy with lipid profile performed 09/10/2020 revealing a total cholesterol 133, LDL 38 and HDL 56.  Essential hypertension History of essential hypertension a blood pressure measured today at 168/76.  She is on amlodipine, carvedilol and losartan.  I am going to have her keep a 30-day blood pressure log and see a Pharm.D. back to review.  She may need additional antihypertensive medications.  CORONARY ARTERY BYPASS GRAFT, HX OF History of CAD status post coronary artery bypass grafting in 1994 with a LIMA to the LAD.  I performed RCA PCI and stenting using a 2.75 x 8 mm long BX velocity bare-metal stent in November 2002.  Myoview performed 03/14/2012 was nonischemic.  She denies chest pain or shortness  of breath.  Foot swelling On furosemide with recent 2D echo that showed normal LV systolic function with grade 1 diastolic dysfunction.  I suspect her swelling is related to diastolic dysfunction plus or minus dietary discretion with regards to salt.  She has trace lower extreme extremity edema on exam today.  Paroxysmal atrial fibrillation (HCC) History of PAF recently recognized at her last office visit on 12/10/2020 currently in sinus rhythm today.  She was recently begun on Xarelto oral anticoagulation.     Lorretta Harp MD FACP,FACC,FAHA, [facility] 12/31/2020 10:03 AM

## 2020-12-31 NOTE — Assessment & Plan Note (Signed)
History of hyperlipidemia on statin therapy with lipid profile performed 09/10/2020 revealing a total cholesterol 133, LDL 38 and HDL 56.

## 2020-12-31 NOTE — Assessment & Plan Note (Signed)
History of PAF recently recognized at her last office visit on 12/10/2020 currently in sinus rhythm today.  She was recently begun on Xarelto oral anticoagulation.

## 2020-12-31 NOTE — Assessment & Plan Note (Signed)
History of CAD status post coronary artery bypass grafting in 1994 with a LIMA to the LAD.  I performed RCA PCI and stenting using a 2.75 x 8 mm long BX velocity bare-metal stent in November 2002.  Myoview performed 03/14/2012 was nonischemic.  She denies chest pain or shortness of breath.

## 2021-01-07 ENCOUNTER — Encounter: Payer: Self-pay | Admitting: Internal Medicine

## 2021-01-07 DIAGNOSIS — M170 Bilateral primary osteoarthritis of knee: Secondary | ICD-10-CM | POA: Diagnosis not present

## 2021-01-07 DIAGNOSIS — M150 Primary generalized (osteo)arthritis: Secondary | ICD-10-CM | POA: Diagnosis not present

## 2021-01-07 DIAGNOSIS — M1189 Other specified crystal arthropathies, multiple sites: Secondary | ICD-10-CM | POA: Diagnosis not present

## 2021-01-07 DIAGNOSIS — Z79899 Other long term (current) drug therapy: Secondary | ICD-10-CM | POA: Diagnosis not present

## 2021-01-07 DIAGNOSIS — M810 Age-related osteoporosis without current pathological fracture: Secondary | ICD-10-CM | POA: Diagnosis not present

## 2021-01-07 DIAGNOSIS — M25561 Pain in right knee: Secondary | ICD-10-CM | POA: Diagnosis not present

## 2021-01-07 DIAGNOSIS — M1711 Unilateral primary osteoarthritis, right knee: Secondary | ICD-10-CM | POA: Diagnosis not present

## 2021-01-07 DIAGNOSIS — Z7952 Long term (current) use of systemic steroids: Secondary | ICD-10-CM | POA: Diagnosis not present

## 2021-01-07 DIAGNOSIS — R600 Localized edema: Secondary | ICD-10-CM | POA: Diagnosis not present

## 2021-01-07 DIAGNOSIS — L0390 Cellulitis, unspecified: Secondary | ICD-10-CM | POA: Diagnosis not present

## 2021-01-08 ENCOUNTER — Telehealth: Payer: Self-pay | Admitting: Pharmacist

## 2021-01-08 NOTE — Progress Notes (Signed)
Chronic Care Management Pharmacy Assistant   Name: Sheila Morgan  MRN: [id number] DOB: 14-Oct-1932   Reason for Encounter: Disease State   Conditions to be addressed/monitored: General    Recent office visits:  12/10/20 Warren Lacy, PA-C-Internal Medicine (Leg swelling) Med change:rivaroxaban Alveda Reasons) 20 MG TABS tablet  01/13/21 Binnie Rail, MD (PCP) Essential hypertension, no med changes Recent consult visits:  12/31/20 Lorretta Harp, MD-Cardiology (Mixed hyperlipidemia)  No med changes, F/U 6 months  01/23/21 Tommy Medal L, RPH-CPP (Hypertension) med changes: Stop losartan 100 mg and  Start olmesartan 20 mg once daily in the mornings, move amlodipine to evenings starting tomorrow Hospital visits:  None in previous 6 months  Medications: Outpatient Encounter Medications as of 01/08/2021  Medication Sig   acetaminophen (TYLENOL) 325 MG tablet Take 2 tablets (650 mg total) by mouth every 6 (six) hours as needed for mild pain, fever or headache.   alendronate (FOSAMAX) 70 MG tablet Take 70 mg by mouth every Saturday. Take with a full glass of water on an empty stomach.   amLODipine (NORVASC) 5 MG tablet TAKE 1 AND 1/2 TABLETS(7.5 MG) BY MOUTH DAILY   Calcium Carbonate-Vitamin D (CALCIUM 600-D PO) Take 1 tablet by mouth 2 (two) times daily.   Calcium Carbonate-Vitamin D 600-400 MG-UNIT tablet 1 tablet with food   carvedilol (COREG) 25 MG tablet TAKE 1 TABLET BY MOUTH TWICE DAILY WITH A MEAL   colchicine 0.6 MG tablet Take 1 tablet by mouth every other day.   diclofenac Sodium (VOLTAREN) 1 % GEL Apply topically. (Patient not taking: Reported on 12/31/2020)   furosemide (LASIX) 20 MG tablet Take 1 tablet (20 mg total) by mouth daily.   glucose blood test strip OneTouch Ultra Test strips  USE TO TEST BLOOD SUGAR TWICE DAILY   isosorbide mononitrate (IMDUR) 30 MG 24 hr tablet TAKE 1 TABLET(30 MG) BY MOUTH DAILY   Lancets (ONETOUCH DELICA PLUS 123XX123) MISC USE AS  DIRECTED TO TEST BLOOD SUGAR TWICE DAILY   levothyroxine (SYNTHROID) 50 MCG tablet TAKE 1 TABLET(50 MCG) BY MOUTH DAILY   losartan (COZAAR) 100 MG tablet TAKE 1 TABLET(100 MG) BY MOUTH DAILY   metFORMIN (GLUCOPHAGE) 500 MG tablet TAKE 1/2 TABLET(250 MG) BY MOUTH DAILY WITH LARGEST MEAL   MULTIPLE VITAMIN PO See admin instructions.   ONETOUCH ULTRA test strip USE TO TEST BLOOD SUGAR TWICE DAILY   potassium chloride (KLOR-CON 10) 10 MEQ tablet Take 3 tablets (30 mEq total) by mouth once for 1 dose.   predniSONE (DELTASONE) 5 MG tablet Take 1 tablet by mouth daily. (Patient not taking: Reported on 12/31/2020)   rivaroxaban (XARELTO) 20 MG TABS tablet Take 1 tablet (20 mg total) by mouth daily with supper.   simvastatin (ZOCOR) 40 MG tablet 1 tablet in the evening (Patient taking differently: 20 mg. 1/2 tablet in the evening TOTAL 20 MG)   vitamin B-12 (CYANOCOBALAMIN) 1000 MCG tablet Take 1,000 mcg by mouth daily.   No facility-administered encounter medications on file as of 01/08/2021.    Contacted Sheila Morgan on 01/08/21 for general disease state and medication adherence call.   Patient is not > 5 days past due for refill on the following medications per chart history:  [facility] Medications: Medication Name/mg Last Fill Days Supply Simvastatin 40 mg  10/24/20 90 Metformin 500 mg  11/25/20  90 Olmesartan 20 mg  01/23/21 90  What concerns do you have about your medications? Spoke with patient daughter Sheila Morgan who  stated that patient is no longer on losartan. It was changed to Olmesartan 20 mg.   The patient denies side effects with her medications.   How often do you forget or accidentally miss a dose? Rarely  Do you use a pillbox? No  Are you having any problems getting your medications from your pharmacy? No  Has the cost of your medications been a concern? Yes If yes, what medication and is patient assistance available or has it been applied for? She stated that patient is also on xarelto  20 mg and it is costing her $45 monthly. She wants to know if patient can get patient assistance for xarelto  Since last visit with CPP, the following interventions have been made: patient will keep a 30-day blood pressure log and see a Pharm.D. back to review, per Dr. Priscille Heidelberg 12/31/20  The patient has not had an ED visit since last contact.   The patient denies problems with their health.   she reports  concerns or questions for [employer], Pharm. D at this time. Daughter would like to know if there is something besides the colchicine that the patient could take. She said she was told that colchicine was only if patient had a flare up and she has been taking it everyday   Care Gaps: Last annual wellness visit:None ID If Diabetic: Last eye exam / retinopathy screening:10/10/19 Last diabetic foot exam:05/17/20  PCP appointment on 01/13/21    [place] Pharmacist Assistant [phone]   Time spent:41

## 2021-01-10 ENCOUNTER — Other Ambulatory Visit: Payer: Self-pay

## 2021-01-12 NOTE — Patient Instructions (Addendum)
  Flu immunization administered today.   A Tetanus vaccine was given.    Medications changes include :   none   A dexa  was ordered.        Someone from their office will call you to schedule an appointment.    Please followup in 6 months

## 2021-01-12 NOTE — Progress Notes (Signed)
Subjective:    Patient ID: Sheila Morgan, female    DOB: [birth year], 87 y.o.   MRN: [id number]  This visit occurred during the SARS-CoV-2 public health emergency.  Safety protocols were in place, including screening questions prior to the visit, additional usage of staff PPE, and extensive cleaning of exam room while observing appropriate contact time as indicated for disinfecting solutions.     HPI The patient is here for follow up of their chronic medical problems, including DM, CAD, htn, hypothyroid, hichol, memory d/o, anemia, renal insuff, afib on xarelto   A1c 7.0% here today.   Bp - 126/82-154/83  HR in 70's  Hand wound healing slowly.  She has a couple days of antibiotics left.  Medications and allergies reviewed with patient and updated if appropriate.  Patient Active Problem List   Diagnosis Date Noted   Abrasion of right hand 01/13/2021   Paroxysmal atrial fibrillation ([place]) 12/31/2020   Foot swelling 09/10/2020   Calcium pyrophosphate arthropathy of multiple sites 05/14/2020   Long term (current) use of systemic steroids 05/14/2020   Small vessel disease ([place]) 02/12/2020   Left leg DVT ([place]), 12/15/19 12/15/2019   Urinary frequency 02/22/2019   Fall 07/14/2018   Bilateral hearing loss 01/05/2018   Memory difficulties 01/05/2018   Multiple closed fractures of ribs of right side 01/19/2017   Acute pain of right shoulder 01/05/2017   Acute pain of right knee 01/05/2017   B12 deficiency 06/13/2015   Hypothyroidism 06/13/2015   DM (diabetes mellitus) type II, controlled, with peripheral vascular disorder ([place]) 03/06/2014   Gout 10/15/2013   Arthritis    Hiatal hernia    Coronary artery disease    Left knee DJD    Benign paroxysmal positional vertigo 05/19/2013   Weakness generalized 05/09/2013   Pseudogout of left knee 03/29/2013   Acute right lumbar radiculopathy 03/18/2013   Shoulder impingement syndrome 01/25/2013   Vertigo 10/17/2012   Renal  insufficiency, mild 06/20/2011   Essential hypertension 12/31/2009   Osteoporosis 12/31/2009   SKIN CANCER, HX OF 12/31/2009   Proteinuria 01/05/2008   Anemia 12/21/2007   Hyperlipidemia 09/08/2006   Polymyalgia rheumatica ([place]) 09/08/2006   CORONARY ARTERY BYPASS GRAFT, HX OF 09/08/2006    Current Outpatient Medications on File Prior to Visit  Medication Sig Dispense Refill   acetaminophen (TYLENOL) 325 MG tablet Take 2 tablets (650 mg total) by mouth every 6 (six) hours as needed for mild pain, fever or headache.     alendronate (FOSAMAX) 70 MG tablet Take 70 mg by mouth every Saturday. Take with a full glass of water on an empty stomach.     amLODipine (NORVASC) 5 MG tablet TAKE 1 AND 1/2 TABLETS(7.5 MG) BY MOUTH DAILY 135 tablet 2   Calcium Carbonate-Vitamin D (CALCIUM 600-D PO) Take 1 tablet by mouth 2 (two) times daily.     Calcium Carbonate-Vitamin D 600-400 MG-UNIT tablet 1 tablet with food     carvedilol (COREG) 25 MG tablet TAKE 1 TABLET BY MOUTH TWICE DAILY WITH A MEAL 180 tablet 1   colchicine 0.6 MG tablet Take 1 tablet by mouth every other day.     furosemide (LASIX) 20 MG tablet Take 1 tablet (20 mg total) by mouth daily. 90 tablet 1   glucose blood test strip OneTouch Ultra Test strips  USE TO TEST BLOOD SUGAR TWICE DAILY     isosorbide mononitrate (IMDUR) 30 MG 24 hr tablet TAKE 1 TABLET(30 MG) BY MOUTH DAILY  90 tablet 1   Lancets (ONETOUCH DELICA PLUS 123XX123) MISC USE AS DIRECTED TO TEST BLOOD SUGAR TWICE DAILY 200 each 5   levothyroxine (SYNTHROID) 50 MCG tablet TAKE 1 TABLET(50 MCG) BY MOUTH DAILY 90 tablet 1   losartan (COZAAR) 100 MG tablet TAKE 1 TABLET(100 MG) BY MOUTH DAILY 90 tablet 2   metFORMIN (GLUCOPHAGE) 500 MG tablet TAKE 1/2 TABLET(250 MG) BY MOUTH DAILY WITH LARGEST MEAL 45 tablet 2   MULTIPLE VITAMIN PO See admin instructions.     ONETOUCH ULTRA test strip USE TO TEST BLOOD SUGAR TWICE DAILY 200 strip 3   rivaroxaban (XARELTO) 20 MG TABS tablet Take  1 tablet (20 mg total) by mouth daily with supper. 30 tablet 6   simvastatin (ZOCOR) 40 MG tablet 1 tablet in the evening (Patient taking differently: 20 mg. 1/2 tablet in the evening TOTAL 20 MG) 90 tablet 0   vitamin B-12 (CYANOCOBALAMIN) 1000 MCG tablet Take 1,000 mcg by mouth daily.     No current facility-administered medications on file prior to visit.    Past Medical History:  Diagnosis Date   Arthritis    Cancer ([place])    skin on nose .  mylenoma   Coronary artery disease    status post post LAD angioplasty in 1993  and subsequent coronary artery bypass grafting x1 with a LIMA to the LAD July of 1994. She had stenting of her RCA with a bare-metal stent November 2002.   Diabetes mellitus    2   Gout    Heart murmur    Hematoma    Right groin   Hemorrhoids    Hiatal hernia    History of blood transfusion    HOH (hard of hearing)    Hyperlipidemia    Hypertension    Left knee DJD    Lightheadedness    has seen Dr Gwenlyn Found and ENT.. cant find out why   Myocardial infarction [facility])    Osteoporosis    PONV (postoperative nausea and vomiting)    Shingles 10/27/2013    Past Surgical History:  Procedure Laterality Date   CORONARY ANGIOPLASTY  1993   LAD   CORONARY ANGIOPLASTY WITH STENT PLACEMENT  03/09/01   RCA   CORONARY ARTERY BYPASS GRAFT  1994   JOINT REPLACEMENT Right 1998   hip   NM MYOVIEW LTD  03/2012   Normal   SKIN GRAFT     to nose skin cancer   TOTAL HIP ARTHROPLASTY     TOTAL KNEE ARTHROPLASTY Left 10/09/2013   Procedure: TOTAL KNEE ARTHROPLASTY;  Surgeon: Lorn Junes, MD;  Location: [facility];  Service: Orthopedics;  Laterality: Left;    Social History   Socioeconomic History   Marital status: Widowed    Spouse name: Not on file   Number of children: 2   Years of education: Not on file   Highest education level: 12th grade  Occupational History   Occupation: Retired   Tobacco Use   Smoking status: Never   Smokeless tobacco: Never  Vaping Use    Vaping Use: Never used  Substance and Sexual Activity   Alcohol use: No   Drug use: No   Sexual activity: Never  Other Topics Concern   Not on file  Social History Narrative   Left handed    Caffeine 4 cups daily    Lives at home alone    Social Determinants of Health   Financial Resource Strain: Low Risk    Difficulty  of Paying Living Expenses: Not hard at all  Food Insecurity: No Food Insecurity   Worried About [place] in the Last Year: Never true   [place] in the Last Year: Never true  Transportation Needs: No Transportation Needs   Lack of Transportation (Medical): No   Lack of Transportation (Non-Medical): No  Physical Activity: Insufficiently Active   Days of Exercise per Week: 3 days   Minutes of Exercise per Session: 20 min  Stress: No Stress Concern Present   Feeling of Stress : Not at all  Social Connections: Unknown   Frequency of Communication with Friends and Family: More than three times a week   Frequency of Social Gatherings with Friends and Family: More than three times a week   Attends Religious Services: Patient refused   Marine scientist or Organizations: Patient refused   Attends Archivist Meetings: Patient refused   Marital Status: Patient refused    Family History  Problem Relation Age of Onset   Heart disease Father    Cancer Sister        breast   Diabetes Sister    Heart disease Brother    Heart disease Brother    Heart disease Brother    Diabetes Sister    Stroke Mother     Review of Systems  Constitutional:  Negative for chills and fever.  Respiratory:  Negative for cough, shortness of breath and wheezing.   Cardiovascular:  Positive for leg swelling. Negative for chest pain and palpitations.  Gastrointestinal:  Positive for constipation. Negative for abdominal pain.  Neurological:  Positive for light-headedness. Negative for headaches.      Objective:   Vitals:   01/13/21 0812  BP: (!)  146/80  Pulse: 72  Temp: 98.7 F (37.1 C)  SpO2: 98%   BP Readings from Last 3 Encounters:  01/13/21 (!) 146/80  12/31/20 (!) 168/76  12/10/20 134/82   Wt Readings from Last 3 Encounters:  01/13/21 157 lb (71.2 kg)  12/31/20 157 lb (71.2 kg)  12/10/20 153 lb (69.4 kg)   Body mass index is 25.34 kg/m.   Physical Exam    Constitutional: Appears well-developed and well-nourished. No distress.  HENT:  Head: Normocephalic and atraumatic.  Neck: Neck supple. No tracheal deviation present. No thyromegaly present.  No cervical lymphadenopathy Cardiovascular: Normal rate, regular rhythm and normal heart sounds.   No murmur heard. No carotid bruit .  No edema Pulmonary/Chest: Effort normal and breath sounds normal. No respiratory distress. No has no wheezes. No rales.  Skin: Skin is warm and dry. Not diaphoretic.  Healing cut on right posterior hand with large scab.  Minimal surrounding redness.  Slight pulling of skin from scab.  Minimal discharge in 1 area. Psychiatric: Normal mood and affect. Behavior is normal.      Assessment & Plan:    See Problem List for Assessment and Plan of chronic medical problems.

## 2021-01-13 ENCOUNTER — Other Ambulatory Visit: Payer: Self-pay

## 2021-01-13 ENCOUNTER — Ambulatory Visit (INDEPENDENT_AMBULATORY_CARE_PROVIDER_SITE_OTHER): Payer: Medicare Other | Admitting: Internal Medicine

## 2021-01-13 ENCOUNTER — Encounter: Payer: Self-pay | Admitting: Internal Medicine

## 2021-01-13 ENCOUNTER — Other Ambulatory Visit: Payer: Self-pay | Admitting: Cardiovascular Disease

## 2021-01-13 VITALS — BP 146/80 | HR 72 | Temp 98.7°F | Ht 66.0 in | Wt 157.0 lb

## 2021-01-13 DIAGNOSIS — I2583 Coronary atherosclerosis due to lipid rich plaque: Secondary | ICD-10-CM

## 2021-01-13 DIAGNOSIS — Z23 Encounter for immunization: Secondary | ICD-10-CM | POA: Diagnosis not present

## 2021-01-13 DIAGNOSIS — N289 Disorder of kidney and ureter, unspecified: Secondary | ICD-10-CM | POA: Diagnosis not present

## 2021-01-13 DIAGNOSIS — E1151 Type 2 diabetes mellitus with diabetic peripheral angiopathy without gangrene: Secondary | ICD-10-CM | POA: Diagnosis not present

## 2021-01-13 DIAGNOSIS — M810 Age-related osteoporosis without current pathological fracture: Secondary | ICD-10-CM | POA: Diagnosis not present

## 2021-01-13 DIAGNOSIS — I10 Essential (primary) hypertension: Secondary | ICD-10-CM

## 2021-01-13 DIAGNOSIS — I2510 Atherosclerotic heart disease of native coronary artery without angina pectoris: Secondary | ICD-10-CM

## 2021-01-13 DIAGNOSIS — E782 Mixed hyperlipidemia: Secondary | ICD-10-CM

## 2021-01-13 DIAGNOSIS — E039 Hypothyroidism, unspecified: Secondary | ICD-10-CM | POA: Diagnosis not present

## 2021-01-13 DIAGNOSIS — D649 Anemia, unspecified: Secondary | ICD-10-CM | POA: Diagnosis not present

## 2021-01-13 DIAGNOSIS — R413 Other amnesia: Secondary | ICD-10-CM

## 2021-01-13 DIAGNOSIS — S60511A Abrasion of right hand, initial encounter: Secondary | ICD-10-CM | POA: Diagnosis not present

## 2021-01-13 DIAGNOSIS — M1189 Other specified crystal arthropathies, multiple sites: Secondary | ICD-10-CM | POA: Diagnosis not present

## 2021-01-13 LAB — POCT GLYCOSYLATED HEMOGLOBIN (HGB A1C)
HbA1c POC (<> result, manual entry): 7 % (ref 4.0–5.6)
HbA1c, POC (controlled diabetic range): 7 % (ref 0.0–7.0)
HbA1c, POC (prediabetic range): 7 % — AB (ref 5.7–6.4)
Hemoglobin A1C: 7 % — AB (ref 4.0–5.6)

## 2021-01-13 NOTE — Assessment & Plan Note (Signed)
Chronic Blood pressure well controlled Continue amlodipine 5 mg daily, carvedilol 25 mg twice daily, Imdur 30 mg daily, losartan 100 mg daily

## 2021-01-13 NOTE — Assessment & Plan Note (Signed)
Chronic Following with cardiology No symptoms concerning for angina Continue current medications

## 2021-01-13 NOTE — Assessment & Plan Note (Signed)
Chronic Has been stable We will hold off on blood work today and recheck at her next visit

## 2021-01-13 NOTE — Assessment & Plan Note (Signed)
Chronic Stable 

## 2021-01-13 NOTE — Assessment & Plan Note (Signed)
Chronic Lab Results  Component Value Date   HGBA1C 7.0 (A) 01/13/2021   HGBA1C 7.0 01/13/2021   HGBA1C 7.0 (A) 01/13/2021   HGBA1C 7.0 01/13/2021   Good control for her Continue metformin to 50 mg once daily

## 2021-01-13 NOTE — Assessment & Plan Note (Signed)
New Has 2 days left of antibiotics Healing well, but has a large scab that will make the healing process take longer and she is starting to get some pulling of her skin Advised to keep moist with Neosporin or Vaseline Td not up-to-date-we will give tetanus vaccine today Is audible continue to monitor and send me a message if healing is not continuing

## 2021-01-13 NOTE — Assessment & Plan Note (Signed)
Chronic Continue simvastatin 20 mg daily 

## 2021-01-13 NOTE — Assessment & Plan Note (Signed)
Chronic Taking Fosamax weekly-this is currently managed by Dr. Johnsie Cancel due-I will order Continue calcium and vitamin D

## 2021-01-13 NOTE — Assessment & Plan Note (Signed)
Chronic  Clinically euthyroid Currently taking levothyroxine 50 mcg daily

## 2021-01-23 ENCOUNTER — Encounter: Payer: Self-pay | Admitting: Pharmacist Clinician (PhC)/ Clinical Pharmacy Specialist

## 2021-01-23 ENCOUNTER — Ambulatory Visit (INDEPENDENT_AMBULATORY_CARE_PROVIDER_SITE_OTHER): Payer: Medicare Other | Admitting: Pharmacist Clinician (PhC)/ Clinical Pharmacy Specialist

## 2021-01-23 ENCOUNTER — Other Ambulatory Visit: Payer: Self-pay

## 2021-01-23 DIAGNOSIS — I10 Essential (primary) hypertension: Secondary | ICD-10-CM | POA: Diagnosis not present

## 2021-01-23 DIAGNOSIS — I2583 Coronary atherosclerosis due to lipid rich plaque: Secondary | ICD-10-CM | POA: Diagnosis not present

## 2021-01-23 DIAGNOSIS — I2510 Atherosclerotic heart disease of native coronary artery without angina pectoris: Secondary | ICD-10-CM

## 2021-01-23 MED ORDER — OLMESARTAN MEDOXOMIL 20 MG PO TABS: 20.0000 mg | ORAL_TABLET | Freq: Every day | ORAL | 6 refills | Status: DC

## 2021-01-23 NOTE — Assessment & Plan Note (Signed)
Patient with essential hypertension, not at goal with current medications.  Rather than add to her pill burden will switch losartan to olmesartan 20 mg daily.  She will continue with regular home BP checks and we will see her back in 6 weeks to check for improvement.

## 2021-01-23 NOTE — Patient Instructions (Signed)
Return for a a follow up appointment November 3 at 9:30  Check your blood pressure at home daily and keep record of the readings.  Take your BP meds as follows:  Stop losartan 100 mg  Start olmesartan 20 mg once daily in the mornings  Move amlodipine to evenings starting tomorrow  Bring all of your meds, your BP cuff and your record of home blood pressures to your next appointment.  Exercise as you're able, try to walk approximately 30 minutes per day.  Keep salt intake to a minimum, especially watch canned and prepared boxed foods.  Eat more fresh fruits and vegetables and fewer canned items.  Avoid eating in fast food restaurants.    HOW TO TAKE YOUR BLOOD PRESSURE: Rest 5 minutes before taking your blood pressure.  Don't smoke or drink caffeinated beverages for at least 30 minutes before. Take your blood pressure before (not after) you eat. Sit comfortably with your back supported and both feet on the floor (don't cross your legs). Elevate your arm to heart level on a table or a desk. Use the proper sized cuff. It should fit smoothly and snugly around your bare upper arm. There should be enough room to slip a fingertip under the cuff. The bottom edge of the cuff should be 1 inch above the crease of the elbow. Ideally, take 3 measurements at one sitting and record the average.

## 2021-01-23 NOTE — Progress Notes (Signed)
01/23/2021 ZLATA ALCAIDE June 24, 1932 [id number]   HPI:  Sheila Morgan is a 87 y.o. female patient of Dr Gwenlyn Found, with a PMH below who presents today for hypertension clinic evaluation.  She was seen by Dr. Gwenlyn Found in early August, at which time her pressure was noted to be 168/76.  He asked that she keep a BP diary for one month then come back for follow up.    Today she returns for follow up with her daughter.  She notes that she has had hypertension for 20-30 years and has been mostly well controlled over the years.  However in the past year or so, has started to see higher numbers more frequently.  No concerns about her medications or issues with compliance.    Past Medical History: ASCVD CABG x 1 1994 (LIMA to LAD), BMS to RCA 2002  Hyperlipidemia LDL 56 on   AF Paroxysmal; CHADS2-VASc score 6, on Xarelto;   DVT 8/21 - unprovoked, now on Xarelto for AF     Blood Pressure Goal:  130/80  Current Medications: carvedilol 25 mg bid, amlodipine 5 mg qd, losartan 100 mg  Social Hx: no tobacco, no alcohol, occasional coffee, sweet tea  and soda most days  Diet: meals on wheels, does add some salt and other seasonings; gets vegetables most days with these  Exercise: none, uses walker  Home BP readings: 18 readings over past 3 weeks average 145/81 with range of 126-156/55-88;  HR average 73  Intolerances: nkda  Labs: 8/22: Na 143, K 4.1, Glu 129, BUN 12, SCr 1.12, GFR 48   Wt Readings from Last 3 Encounters:  01/23/21 157 lb (71.2 kg)  01/13/21 157 lb (71.2 kg)  12/31/20 157 lb (71.2 kg)   BP Readings from Last 3 Encounters:  01/23/21 (!) 164/82  01/13/21 (!) 146/80  12/31/20 (!) 168/76   Pulse Readings from Last 3 Encounters:  01/23/21 76  01/13/21 72  12/31/20 78    Current Outpatient Medications  Medication Sig Dispense Refill   acetaminophen (TYLENOL) 325 MG tablet Take 2 tablets (650 mg total) by mouth every 6 (six) hours as needed for mild pain, fever or  headache.     alendronate (FOSAMAX) 70 MG tablet Take 70 mg by mouth every Saturday. Take with a full glass of water on an empty stomach.     amLODipine (NORVASC) 5 MG tablet TAKE 1 AND 1/2 TABLETS(7.5 MG) BY MOUTH DAILY 135 tablet 2   Calcium Carbonate-Vitamin D (CALCIUM 600-D PO) Take 1 tablet by mouth 2 (two) times daily.     carvedilol (COREG) 25 MG tablet TAKE 1 TABLET BY MOUTH TWICE DAILY WITH A MEAL 180 tablet 1   colchicine 0.6 MG tablet Take 1 tablet by mouth every other day.     furosemide (LASIX) 20 MG tablet Take 1 tablet (20 mg total) by mouth daily. 90 tablet 1   glucose blood test strip OneTouch Ultra Test strips  USE TO TEST BLOOD SUGAR TWICE DAILY     isosorbide mononitrate (IMDUR) 30 MG 24 hr tablet TAKE 1 TABLET(30 MG) BY MOUTH DAILY 90 tablet 1   Lancets (ONETOUCH DELICA PLUS [id number]) MISC USE AS DIRECTED TO TEST BLOOD SUGAR TWICE DAILY 200 each 5   levothyroxine (SYNTHROID) 50 MCG tablet TAKE 1 TABLET(50 MCG) BY MOUTH DAILY 90 tablet 1   losartan (COZAAR) 100 MG tablet TAKE 1 TABLET(100 MG) BY MOUTH DAILY 90 tablet 2   metFORMIN (GLUCOPHAGE) 500 MG tablet  TAKE 1/2 TABLET(250 MG) BY MOUTH DAILY WITH LARGEST MEAL 45 tablet 2   MULTIPLE VITAMIN PO See admin instructions.     ONETOUCH ULTRA test strip USE TO TEST BLOOD SUGAR TWICE DAILY 200 strip 3   predniSONE (DELTASONE) 5 MG tablet Take 5 mg by mouth daily with breakfast.     rivaroxaban (XARELTO) 20 MG TABS tablet Take 1 tablet (20 mg total) by mouth daily with supper. 30 tablet 6   simvastatin (ZOCOR) 40 MG tablet TAKE 1 TABLET BY MOUTH IN THE EVENING 90 tablet 1   vitamin B-12 (CYANOCOBALAMIN) 1000 MCG tablet Take 1,000 mcg by mouth daily.     No current facility-administered medications for this visit.    No Known Allergies  Past Medical History:  Diagnosis Date   Arthritis    Cancer ([place])    skin on nose .  mylenoma   Coronary artery disease    status post post LAD angioplasty in 1993  and subsequent  coronary artery bypass grafting x1 with a LIMA to the LAD July of 1994. She had stenting of her RCA with a bare-metal stent November 2002.   Diabetes mellitus    2   Gout    Heart murmur    Hematoma    Right groin   Hemorrhoids    Hiatal hernia    History of blood transfusion    HOH (hard of hearing)    Hyperlipidemia    Hypertension    Left knee DJD    Lightheadedness    has seen Dr Gwenlyn Found and ENT.. cant find out why   Myocardial infarction [facility])    Osteoporosis    PONV (postoperative nausea and vomiting)    Shingles 10/27/2013    Blood pressure (!) 164/82, pulse 76, resp. rate 15, height 5\' 7"  (1.702 m), weight 157 lb (71.2 kg), SpO2 96 %.  Essential hypertension Patient with essential hypertension, not at goal with current medications.  Rather than add to her pill burden will switch losartan to olmesartan 20 mg daily.  She will continue with regular home BP checks and we will see her back in 6 weeks to check for improvement.     Tommy Medal PharmD CPP [facility] [address] [place], [place] [address] [phone]

## 2021-01-31 NOTE — Telephone Encounter (Signed)
Patient has asked multiple times about chronic colchicine and if there is anything else she can take.  Per chart review, pt sees Dr Kathlene November (rheumatology) for CPPD (calcium pyrophosphate arthropathy) and it is noted that patient cannot stop colchicine or prednisone because it causes a flare. Notably, urate-lowering therapy is not effective for CPPD like it is for gout. Therefore there are currently no other treatments available for this particular issue.

## 2021-01-31 NOTE — Progress Notes (Signed)
    Spoke with patient daughter [name] who said that she will check with the pharmacy and get copays but she does believe that the patient will quality for patient assistance for [place] Pharmacist Assistant [phone]

## 2021-02-03 NOTE — Telephone Encounter (Signed)
Patient has [employer] Part D insurance and reports copay for Xarelto is cost prohibitive at this time.  Reviewed application process for  [employer]  patient assistance program. Patient meets income/out of pocket spend criteria for the program. Patient will provide proof of income, out of pocket spend report, and will sign application. Will collaborate with prescriber Dr Gwenlyn Found for the provider portion of application. Once completed, application will be submitted via Fax   Patient requested application be mailed to her home. Mailed application with instructions to complete and take to Dr Kennon Holter office.  Charlton Haws, [facility]

## 2021-02-17 ENCOUNTER — Other Ambulatory Visit: Payer: Self-pay | Admitting: Internal Medicine

## 2021-02-20 ENCOUNTER — Ambulatory Visit (INDEPENDENT_AMBULATORY_CARE_PROVIDER_SITE_OTHER): Payer: Medicare Other | Admitting: Pharmacist

## 2021-02-20 ENCOUNTER — Other Ambulatory Visit: Payer: Self-pay

## 2021-02-20 DIAGNOSIS — E782 Mixed hyperlipidemia: Secondary | ICD-10-CM

## 2021-02-20 DIAGNOSIS — I10 Essential (primary) hypertension: Secondary | ICD-10-CM

## 2021-02-20 DIAGNOSIS — M810 Age-related osteoporosis without current pathological fracture: Secondary | ICD-10-CM

## 2021-02-20 DIAGNOSIS — M1120 Other chondrocalcinosis, unspecified site: Secondary | ICD-10-CM

## 2021-02-20 DIAGNOSIS — E1151 Type 2 diabetes mellitus with diabetic peripheral angiopathy without gangrene: Secondary | ICD-10-CM

## 2021-02-20 DIAGNOSIS — I480 Paroxysmal atrial fibrillation: Secondary | ICD-10-CM

## 2021-02-20 DIAGNOSIS — M353 Polymyalgia rheumatica: Secondary | ICD-10-CM

## 2021-02-20 DIAGNOSIS — I2510 Atherosclerotic heart disease of native coronary artery without angina pectoris: Secondary | ICD-10-CM

## 2021-02-20 NOTE — Progress Notes (Signed)
Chronic Care Management Pharmacy Note  02/21/2021 Name:  Sheila Morgan MRN:  [id number] DOB:  [birth year]  Summary: -Pt has not scheduled DEXA scan yet; she has also been on alendronate ~10 years and approaching end of treatment period -BP is 150s/70s at home; she is seeing HTN clinic at cardiology  Recommendations/Changes made from today's visit: -Schedule DEXA scan; consider stopping alendronate if DEXA is stable -Move olmesartan and Xarelto to evening   Subjective: Sheila Morgan is an 87 y.o. year old female who is a primary patient of Burns, Claudina Lick, MD.  The CCM team was consulted for assistance with disease management and care coordination needs.    Engaged with patient by telephone for follow up visit in response to provider referral for pharmacy case management and/or care coordination services.   Consent to Services:  The patient was given information about Chronic Care Management services, agreed to services, and gave verbal consent prior to initiation of services.  Please see initial visit note for detailed documentation.   Patient Care Team: Binnie Rail, MD as PCP - General (Internal Medicine) Lorretta Harp, MD as PCP - Cardiology (Cardiology) Garald Balding, MD as Consulting Physician (Orthopedic Surgery) Baldwin Jamaica, Lonzo Cloud as Physician Assistant (Orthopedic Surgery) Marzetta Board, DPM as Consulting Physician (Podiatry) Leroux-Martinez, Nancy Marus, AUD (Audiology) Kathrynn Ducking, MD as Consulting Physician (Neurology) Charlton Haws, [facility] as Pharmacist (Pharmacist)  Patient lives alone currently, right now patient's son is coming over twice a day to help with medications and daily activities, and [employer] delivers once a day. Patient's children set up her pill box each week. Patient takes her own blood pressure and blood sugar each morning and writes them down.   Recent office visits: 01/13/21 Dr Quay Burow OV: f/u; d/c  prednisone (pt not taking) and potassium. Ordered DEXA.  07/11/20 Dr. Quay Burow discontinued Xarelto, completed over 6 mos of treatment for DVT 12/15/19. Restart Aspirin 81 mg daily.  04/30/20 pt message: PCP Increased furosemide to 40 mg daily  Recent consult visits: 01/23/21 Dr Brion Aliment (PharmD): HTN visit. D/c losartan, start Olmesartan 20 mg.   12/31/20 Dr Gwenlyn Found (cardiology): f/u CAD, HTN, Afib. BP elevated, referred to PharmD for mgmt.  12/10/20 PA Quentin Ore (cardiology): new Afib dx. Stop aspirin and Start Xarelto 20 mg daily.  02/12/20 NP [facility], no medication changes. Recommend no longer driving.  Hospital visits: None in previous 6 months  Objective:  Lab Results  Component Value Date   CREATININE 1.12 (H) 12/10/2020   BUN 12 12/10/2020   GFR 38.80 (L) 09/10/2020   GFRNONAA 43 (L) 01/12/2020   GFRAA 50 (L) 01/12/2020   NA 143 12/10/2020   K 4.1 12/10/2020   CALCIUM 10.0 12/10/2020   CO2 25 12/10/2020   GLUCOSE 129 (H) 12/10/2020    Lab Results  Component Value Date/Time   HGBA1C 7.0 (A) 01/13/2021 08:40 AM   HGBA1C 7.0 01/13/2021 08:40 AM   HGBA1C 7.0 (A) 01/13/2021 08:40 AM   HGBA1C 7.0 01/13/2021 08:40 AM   HGBA1C 7.5 (H) 09/10/2020 11:15 AM   HGBA1C 6.8 (H) 01/12/2020 10:11 AM   GFR 38.80 (L) 09/10/2020 11:15 AM   GFR 52.17 (L) 08/28/2019 09:49 AM   MICROALBUR 1.6 02/12/2014 09:31 AM   MICROALBUR 2.1 (H) 07/10/2013 10:33 AM    Last diabetic Eye exam:  Lab Results  Component Value Date/Time   HMDIABEYEEXA No Retinopathy 10/10/2019 12:00 AM    Last diabetic Foot exam:  Lab Results  Component Value Date/Time   HMDIABFOOTEX done 12/19/2018 12:00 AM     Lab Results  Component Value Date   CHOL 133 09/10/2020   HDL 56.90 09/10/2020   LDLCALC 38 09/10/2020   TRIG 191.0 (H) 09/10/2020   CHOLHDL 2 09/10/2020    Hepatic Function Latest Ref Rng & Units 09/10/2020 01/12/2020 08/28/2019  Total Protein 6.0 - 8.3 g/dL 7.0 7.2 7.1  Albumin 3.5 - 5.2  g/dL 3.9 - 4.2  AST 0 - 37 U/L 15 17 18   ALT 0 - 35 U/L 17 14 16   Alk Phosphatase 39 - 117 U/L 73 - 64  Total Bilirubin 0.2 - 1.2 mg/dL 0.5 0.6 0.6  Bilirubin, Direct 0.00 - 0.40 mg/dL - - -    Lab Results  Component Value Date/Time   TSH 2.350 12/10/2020 11:38 AM   TSH 2.10 09/10/2020 11:15 AM   FREET4 0.88 08/15/2014 07:25 AM    CBC Latest Ref Rng & Units 12/10/2020 09/10/2020 01/12/2020  WBC 3.4 - 10.8 x10E3/uL 11.3(H) 10.5 9.7  Hemoglobin 11.1 - 15.9 g/dL 14.3 13.0 13.9  Hematocrit 34.0 - 46.6 % 42.8 38.4 42.1  Platelets 150 - 450 x10E3/uL 213 184.0 194    Lab Results  Component Value Date/Time   VD25OH 70.00 08/28/2019 09:49 AM   VD25OH 81.52 07/16/2017 08:25 AM    Clinical ASCVD: Yes  The ASCVD Risk score (Arnett DK, et al., 2019) failed to calculate for the following reasons:   The 2019 ASCVD risk score is only valid for ages 76 to 51    Depression screen PHQ 2/9 01/29/2020 08/28/2019 07/06/2018  Decreased Interest 0 0 0  Down, Depressed, Hopeless 0 0 0  PHQ - 2 Score 0 0 0  Altered sleeping - - -  Tired, decreased energy - - -  Change in appetite - - -  Feeling bad or failure about yourself  - - -  Trouble concentrating - - -  Moving slowly or fidgety/restless - - -  Suicidal thoughts - - -  PHQ-9 Score - - -  Difficult doing work/chores - - -  Some recent data might be hidden      Social History   Tobacco Use  Smoking Status Never  Smokeless Tobacco Never   BP Readings from Last 3 Encounters:  01/23/21 (!) 164/82  01/13/21 (!) 146/80  12/31/20 (!) 168/76   Pulse Readings from Last 3 Encounters:  01/23/21 76  01/13/21 72  12/31/20 78   Wt Readings from Last 3 Encounters:  01/23/21 157 lb (71.2 kg)  01/13/21 157 lb (71.2 kg)  12/31/20 157 lb (71.2 kg)   BMI Readings from Last 3 Encounters:  01/23/21 24.59 kg/m  01/13/21 25.34 kg/m  12/31/20 25.34 kg/m    Assessment/Interventions: Review of patient past medical history, allergies,  medications, health status, including review of consultants reports, laboratory and other test data, was performed as part of comprehensive evaluation and provision of chronic care management services.   SDOH:  (Social Determinants of Health) assessments and interventions performed: Yes  SDOH Screenings   Alcohol Screen: Not on file  Depression (VAP0-1): Not on file  Financial Resource Strain: Not on file  Food Insecurity: Not on file  Housing: Not on file  Physical Activity: Not on file  Social Connections: Not on file  Stress: Not on file  Tobacco Use: Low Risk    Smoking Tobacco Use: Never   Smokeless Tobacco Use: Never   Passive Exposure: Not on  file  Transportation Needs: Not on file    [place]  No Known Allergies  Medications Reviewed Today     Reviewed by Rockne Menghini, RPH-CPP (Pharmacist) on 01/23/21 at 31  Med List Status: <None>   Medication Order Taking? Sig Documenting Provider Last Dose Status Informant  acetaminophen (TYLENOL) 325 MG tablet [id number] Yes Take 2 tablets (650 mg total) by mouth every 6 (six) hours as needed for mild pain, fever or headache. Rai, Vernelle [name], MD Taking Active   alendronate (FOSAMAX) 70 MG tablet [id number] Yes Take 70 mg by mouth every Saturday. Take with a full glass of water on an empty stomach. [provider] Taking Active Family Member  amLODipine (NORVASC) 5 MG tablet [id number] Yes TAKE 1 AND 1/2 TABLETS(7.5 MG) BY MOUTH DAILY Burns, Claudina Lick, MD Taking Active   Calcium Carbonate-Vitamin D (CALCIUM 600-D PO) [id number] Yes Take 1 tablet by mouth 2 (two) times daily. [provider] Taking Active Family Member  carvedilol (COREG) 25 MG tablet [id number] Yes TAKE 1 TABLET BY MOUTH TWICE DAILY WITH A MEAL Burns, Claudina Lick, MD Taking Active   colchicine 0.6 MG tablet [id number] Yes Take 1 tablet by mouth every other day. [provider] Taking Active   furosemide (LASIX) 20 MG tablet [id number] Yes Take  1 tablet (20 mg total) by mouth daily. Binnie Rail, MD Taking Active   glucose blood test strip [id number] Yes OneTouch Ultra Test strips  USE TO TEST BLOOD SUGAR TWICE DAILY [provider] Taking Active   isosorbide mononitrate (IMDUR) 30 MG 24 hr tablet [id number] Yes TAKE 1 TABLET(30 MG) BY MOUTH DAILY Burns, Claudina Lick, MD Taking Active   Lancets [facility] DELICA PLUS [id number]) [place] [id number] Yes USE AS DIRECTED TO TEST BLOOD SUGAR TWICE DAILY Binnie Rail, MD Taking Active   levothyroxine (SYNTHROID) 50 MCG tablet [id number] Yes TAKE 1 TABLET(50 MCG) BY MOUTH DAILY Burns, Claudina Lick, MD Taking Active   losartan (COZAAR) 100 MG tablet [id number] Yes TAKE 1 TABLET(100 MG) BY MOUTH DAILY Binnie Rail, MD Taking Active   metFORMIN (GLUCOPHAGE) 500 MG tablet [id number] Yes TAKE 1/2 TABLET(250 MG) BY MOUTH DAILY WITH LARGEST MEAL Binnie Rail, MD Taking Active   MULTIPLE VITAMIN PO [id number] Yes See admin instructions. [provider] Taking Active   [facility] ULTRA test strip [id number] Yes USE TO TEST BLOOD SUGAR TWICE DAILY Burns, Claudina Lick, MD Taking Active   predniSONE (DELTASONE) 5 MG tablet [id number] Yes Take 5 mg by mouth daily with breakfast. [provider] Taking Active   rivaroxaban (XARELTO) 20 MG TABS tablet [id number] Yes Take 1 tablet (20 mg total) by mouth daily with supper. Warren Lacy, PA-C Taking Active   simvastatin (ZOCOR) 40 MG tablet [id number] Yes TAKE 1 TABLET BY MOUTH IN THE Maye Hides, MD Taking Active            Med Note Domingo Cocking [facility]   Thu Jan 23, 2021 10:33 AM) Dewaine Conger 1/2 pill  vitamin B-12 (CYANOCOBALAMIN) 1000 MCG tablet [id number] Yes Take 1,000 mcg by mouth daily. [provider] Taking Active Family Member            Patient Active Problem List   Diagnosis Date Noted   Abrasion of right hand 01/13/2021   Paroxysmal atrial fibrillation ([place]) 12/31/2020   Foot swelling 09/10/2020   Calcium pyrophosphate  arthropathy of multiple sites 05/14/2020   Small vessel disease ([place]) 02/12/2020   Left leg  DVT ([place]), 12/15/19 12/15/2019   Urinary frequency 02/22/2019   Fall 07/14/2018   Bilateral hearing loss 01/05/2018   Memory difficulties 01/05/2018   Multiple closed fractures of ribs of right side 01/19/2017   Acute pain of right shoulder 01/05/2017   Acute pain of right knee 01/05/2017   B12 deficiency 06/13/2015   Hypothyroidism 06/13/2015   DM (diabetes mellitus) type II, controlled, with peripheral vascular disorder ([place]) 03/06/2014   Gout 10/15/2013   Arthritis    Hiatal hernia    Coronary artery disease    Left knee DJD    Benign paroxysmal positional vertigo 05/19/2013   Weakness generalized 05/09/2013   Pseudogout of left knee 03/29/2013   Acute right lumbar radiculopathy 03/18/2013   Shoulder impingement syndrome 01/25/2013   Vertigo 10/17/2012   Renal insufficiency, mild 06/20/2011   Essential hypertension 12/31/2009   Osteoporosis - Dr Kathlene November 12/31/2009   SKIN CANCER, HX OF 12/31/2009   Proteinuria 01/05/2008   Anemia 12/21/2007   Hyperlipidemia 09/08/2006   Polymyalgia rheumatica ([place]) 09/08/2006   CORONARY ARTERY BYPASS GRAFT, HX OF 09/08/2006    Immunization History  Administered Date(s) Administered   Fluad Quad(high Dose 65+) 01/11/2019, 01/29/2020, 01/13/2021   Influenza Whole 03/08/2007, 02/13/2008, 02/15/2009, 12/31/2009   Influenza, High Dose Seasonal PF 02/12/2014, 01/05/2018   Influenza,inj,Quad PF,6+ Mos 03/13/2015, 12/23/2015   Influenza-Unspecified 02/04/2013, 02/17/2017   PFIZER(Purple Top)SARS-COV-2 Vaccination 06/25/2019, 12/22/2019   Pneumococcal Conjugate-13 06/13/2015   Pneumococcal Polysaccharide-23 03/29/2013   Td 01/07/2010   Tdap 01/13/2021    Conditions to be addressed/monitored:  Hypertension, Hyperlipidemia, Diabetes, Coronary Artery Disease, Chronic Kidney Disease, Osteoporosis, Osteoarthritis and Gout  Care Plan : [place]  Updates made by Charlton Haws, [place] since 02/21/2021 12:00 AM     Problem: Hypertension, Hyperlipidemia, Diabetes, Coronary Artery Disease, Chronic Kidney Disease, Osteoporosis, Osteoarthritis and Gout   Priority: High     Long-Range Goal: Disease management   Start Date: 08/22/2020  Expected End Date: 08/22/2021  This Visit's Progress: On track  Recent Progress: On track  Priority: High  Note:   Current Barriers:  Unable to independently afford treatment regimen Unable to independently monitor therapeutic efficacy  Pharmacist Clinical Goal(s):  Patient will verbalize ability to afford treatment regimen achieve adherence to monitoring guidelines and medication adherence to achieve therapeutic efficacy  Interventions: 1:1 collaboration with Binnie Rail, MD regarding development and update of comprehensive plan of care as evidenced by provider attestation and co-signature Inter-disciplinary care team collaboration (see longitudinal plan of care) Comprehensive medication review performed; medication list updated in electronic medical record  AFIB   Patient is currently rate controlled. CHA2DS2-VASc Score = 7   Patient is currently controlled on the following medications:  Carvedilol 25 mg BID Xarelto 20 mg daily AM  We discussed:   increased risk of stroke due to Afib and benefits of anticoagulation for stroke prevention; importance of adherence to anticoagulant exactly as prescribed; avoidance of NSAIDs due to increased bleeding risk with anticoagulants; seeking medical attention after a head injury or if there is blood in the urine/stool; -pt reports Xarelto is expensive; she received pt assistance forms and will take them to cardiology to complete; advised to move Xarelto to evening meal  Plan: Move Xarelto to evening dosing  Hypertension    BP goal is:  <140/90 Patient checks BP at home daily Patient home BP readings are ranging: 150s/70s   Patient has  failed these meds in the past: n/a Patient is currently controlled  on the following medications:  Amlodipine 5 mg - 1.5 tablets daily AM Carvedilol 25 mg BID Furosemide 40 mg daily PM Isosorbide MN 30 mg daily AM Olmesartan 20 mg daily AM   We discussed: BP is above goal, pt is taking olmesartan in AM; discussed benefits of taking ARB at bedtime   Plan: Move olmesartan to bedtime   Hyperlipidemia / CAD    LDL goal < 70 CABG 1994, PCI 2002   Patient has failed these meds in past: n/a Patient is currently controlled on the following medications:  Simvastatin 40 mg daily PM Isosorbide MN 30 mg daily Aspirin 81 mg   We discussed:  Cholesterol goals; benefits of statin; indication for aspirin given history of CABG, PCI; caregiver endorses aspirin is now included in her weekly pill box   Plan: Continue current medications   Diabetes    A1c goal <7%   Patient has failed these meds in past: n/a Patient is currently controlled on the following medications: Metformin 500 mg - 1/2 tablet daily   We discussed: A1c goals; benefit of metformin; pt may not need to check BG every day since A1c is at goal and she only takes 1 oral med   Plan: Continue current medications   CPPD    Patient has failed these meds in past: n/a Patient is currently controlled on the following medications:  Colchicine 0.6 mg every other day (Dr Aryl - rheumatology) Prednisone 5 mg daily   We discussed:  Caregiver reports colchicine is expensive - likely Tier 3 due not non-preferred medication for prevention of gout; Patient has asked multiple times about chronic colchicine and if there is anything else she can take.   Per chart review, pt sees Dr Kathlene November (rheumatology) for CPPD (calcium pyrophosphate arthropathy) and it is noted that patient cannot stop colchicine or prednisone because it causes a flare. Notably, urate-lowering therapy is not effective for CPPD like it is for gout. Therefore there are  currently no other treatments available for this particular issue.   Plan: Continue current medications   Polymyalgia rheumatica / Osteoarthritis    Patient has failed these meds in past: n/a Patient is currently controlled on the following medications:  Prednisone 5 mg daily Diclofenac 1% gel Tylenol 325 mg - 2 tablets q6h PRN   We discussed:  Caregiver was not sure of indication for prednisone - she assumed for gout. Discussed prednisone as a treatment for autoimmune disease (PR). Discussed common side effects of steroids, including increased appetite, weight gain, agitation, insomnia, high BP and BG - caregiver denies.   Plan: Continue current medications    Osteoporosis    Last DEXA Scan: 06/01/2018              T-Score femoral neck: -2.5             T-Score total hip: -1.9             T-Score lumbar spine: -2.0              Patient is a candidate for pharmacologic treatment due to T-Score < -2.5 in femoral neck   Patient has failed these meds in past: n/a Patient is currently controlled on the following medications:  Alendronate 70 mg weekly - per Dr Kathlene November (1st rx'd 2012 in chart) Calcium carbonate-Vitamin D    We discussed:  unknown if patient has been taking alendronate consistently for 10 years or if there have been breaks; discussed length of therapy for bisphosphonates is  10 years - no established benefit beyond that; PCP has ordered repeat DEXA scan, pt has not heard about scheduling so advised pt call [facility] and try to schedule DEXA scan   Plan: Schedule DEXA scan; consider stopping alendronate given ~10 years on therapy  Patient Goals/Self-Care Activities Patient will:  - take medications as prescribed -focus on medication adherence by pill box -collaborate with provider on medication access solutions -Move Xarelto and olmesartan to PM  -Schedule DEXA scan; discuss alendronate length of therapy with rheumatologist     Medication Assistance:  Xarelto - pt  assistance in process. Pt will bring forms to cardiologist office to complete. Expected approval 03/03/21  Patient's preferred pharmacy is:  [id number] [address], [place] [address] Phone: [phone] Fax: [phone]  Uses pill box? Yes Pt endorses 100% compliance  We discussed: Current pharmacy is preferred with insurance plan and patient is satisfied with pharmacy services Patient decided to: Continue current medication management strategy  Care Plan and Follow Up Patient Decision:  Patient agrees to Care Plan and Follow-up.  Plan: Telephone follow up appointment with care management team member scheduled for:  6 months  Charlene Brooke, PharmD, [place], CPP Clinical Pharmacist [facility] [phone]

## 2021-02-21 NOTE — Patient Instructions (Signed)
Visit Information  Phone number for Pharmacist: [phone]   Goals Addressed             This Visit's Progress    Manage My Medicine       Timeframe:  Long-Range Goal Priority:  High Start Date:      02/21/21                       Expected End Date:   02/21/22                    Follow Up Date April 2023   - call for medicine refill 2 or 3 days before it runs out - call if I am sick and can't take my medicine - keep a list of all the medicines I take; vitamins and herbals too - use a pillbox to sort medicine  -Bring Xarelto paperwork to cardiology office -Move Xarelto and olmesartan to PM  -Schedule DEXA scan; discuss alendronate length of therapy with rheumatologist   Why is this important?   These steps will help you keep on track with your medicines.   Notes:         Care Plan : [place]  Updates made by Charlton Haws, RPH since 02/21/2021 12:00 AM     Problem: Hypertension, Hyperlipidemia, Diabetes, Coronary Artery Disease, Chronic Kidney Disease, Osteoporosis, Osteoarthritis and Gout   Priority: High     Long-Range Goal: Disease management   Start Date: 08/22/2020  Expected End Date: 08/22/2021  This Visit's Progress: On track  Recent Progress: On track  Priority: High  Note:   Current Barriers:  Unable to independently afford treatment regimen Unable to independently monitor therapeutic efficacy  Pharmacist Clinical Goal(s):  Patient will verbalize ability to afford treatment regimen achieve adherence to monitoring guidelines and medication adherence to achieve therapeutic efficacy  Interventions: 1:1 collaboration with Binnie Rail, MD regarding development and update of comprehensive plan of care as evidenced by provider attestation and co-signature Inter-disciplinary care team collaboration (see longitudinal plan of care) Comprehensive medication review performed; medication list updated in electronic medical record  AFIB    Patient is currently rate controlled. CHA2DS2-VASc Score = 7   Patient is currently controlled on the following medications:  Carvedilol 25 mg BID Xarelto 20 mg daily AM  We discussed:   increased risk of stroke due to Afib and benefits of anticoagulation for stroke prevention; importance of adherence to anticoagulant exactly as prescribed; avoidance of NSAIDs due to increased bleeding risk with anticoagulants; seeking medical attention after a head injury or if there is blood in the urine/stool; -pt reports Xarelto is expensive; she received pt assistance forms and will take them to cardiology to complete; advised to move Xarelto to evening meal  Plan: Move Xarelto to evening dosing  Hypertension    BP goal is:  <140/90 Patient checks BP at home daily Patient home BP readings are ranging: 150s/70s   Patient has failed these meds in the past: n/a Patient is currently controlled on the following medications:  Amlodipine 5 mg - 1.5 tablets daily AM Carvedilol 25 mg BID Furosemide 40 mg daily PM Isosorbide MN 30 mg daily AM Olmesartan 20 mg daily AM   We discussed: BP is above goal, pt is taking olmesartan in AM; discussed benefits of taking ARB at bedtime   Plan: Move olmesartan to bedtime   Hyperlipidemia / CAD    LDL goal < 70 CABG 1994,  PCI 2002   Patient has failed these meds in past: n/a Patient is currently controlled on the following medications:  Simvastatin 40 mg daily PM Isosorbide MN 30 mg daily Aspirin 81 mg   We discussed:  Cholesterol goals; benefits of statin; indication for aspirin given history of CABG, PCI; caregiver endorses aspirin is now included in her weekly pill box   Plan: Continue current medications   Diabetes    A1c goal <7%   Patient has failed these meds in past: n/a Patient is currently controlled on the following medications: Metformin 500 mg - 1/2 tablet daily   We discussed: A1c goals; benefit of metformin; pt may not need to  check BG every day since A1c is at goal and she only takes 1 oral med   Plan: Continue current medications   CPPD    Patient has failed these meds in past: n/a Patient is currently controlled on the following medications:  Colchicine 0.6 mg every other day (Dr Aryl - rheumatology) Prednisone 5 mg daily   We discussed:  Caregiver reports colchicine is expensive - likely Tier 3 due not non-preferred medication for prevention of gout; Patient has asked multiple times about chronic colchicine and if there is anything else she can take.   Per chart review, pt sees Dr Kathlene November (rheumatology) for CPPD (calcium pyrophosphate arthropathy) and it is noted that patient cannot stop colchicine or prednisone because it causes a flare. Notably, urate-lowering therapy is not effective for CPPD like it is for gout. Therefore there are currently no other treatments available for this particular issue.   Plan: Continue current medications   Polymyalgia rheumatica / Osteoarthritis    Patient has failed these meds in past: n/a Patient is currently controlled on the following medications:  Prednisone 5 mg daily Diclofenac 1% gel Tylenol 325 mg - 2 tablets q6h PRN   We discussed:  Caregiver was not sure of indication for prednisone - she assumed for gout. Discussed prednisone as a treatment for autoimmune disease (PR). Discussed common side effects of steroids, including increased appetite, weight gain, agitation, insomnia, high BP and BG - caregiver denies.   Plan: Continue current medications    Osteoporosis    Last DEXA Scan: 06/01/2018              T-Score femoral neck: -2.5             T-Score total hip: -1.9             T-Score lumbar spine: -2.0              Patient is a candidate for pharmacologic treatment due to T-Score < -2.5 in femoral neck   Patient has failed these meds in past: n/a Patient is currently controlled on the following medications:  Alendronate 70 mg weekly - per Dr Kathlene November (1st  rx'd 2012 in chart) Calcium carbonate-Vitamin D    We discussed:  unknown if patient has been taking alendronate consistently for 10 years or if there have been breaks; discussed length of therapy for bisphosphonates is 10 years - no established benefit beyond that; PCP has ordered repeat DEXA scan, pt has not heard about scheduling so advised pt call [facility] and try to schedule DEXA scan   Plan: Schedule DEXA scan; consider stopping alendronate given ~10 years on therapy  Patient Goals/Self-Care Activities Patient will:  - take medications as prescribed -focus on medication adherence by pill box -collaborate with provider on medication access solutions -Move Xarelto and  olmesartan to PM  -Schedule DEXA scan; discuss alendronate length of therapy with rheumatologist      Patient verbalizes understanding of instructions provided today and agrees to view in [place].  Telephone follow up appointment with pharmacy team member scheduled for: 6 months  Charlene Brooke, PharmD, [place], CPP Clinical Pharmacist [facility] [phone]

## 2021-02-26 ENCOUNTER — Encounter: Payer: Self-pay | Admitting: Podiatry

## 2021-02-26 ENCOUNTER — Other Ambulatory Visit: Payer: Self-pay

## 2021-02-26 ENCOUNTER — Ambulatory Visit (INDEPENDENT_AMBULATORY_CARE_PROVIDER_SITE_OTHER): Payer: Medicare Other | Admitting: Podiatry

## 2021-02-26 DIAGNOSIS — M79674 Pain in right toe(s): Secondary | ICD-10-CM | POA: Diagnosis not present

## 2021-02-26 DIAGNOSIS — L84 Corns and callosities: Secondary | ICD-10-CM

## 2021-02-26 DIAGNOSIS — B351 Tinea unguium: Secondary | ICD-10-CM | POA: Diagnosis not present

## 2021-02-26 DIAGNOSIS — M79675 Pain in left toe(s): Secondary | ICD-10-CM | POA: Diagnosis not present

## 2021-02-26 DIAGNOSIS — E1151 Type 2 diabetes mellitus with diabetic peripheral angiopathy without gangrene: Secondary | ICD-10-CM | POA: Diagnosis not present

## 2021-03-03 ENCOUNTER — Telehealth: Payer: Self-pay | Admitting: Cardiovascular Disease

## 2021-03-03 DIAGNOSIS — I2510 Atherosclerotic heart disease of native coronary artery without angina pectoris: Secondary | ICD-10-CM | POA: Diagnosis not present

## 2021-03-03 DIAGNOSIS — E782 Mixed hyperlipidemia: Secondary | ICD-10-CM | POA: Diagnosis not present

## 2021-03-03 DIAGNOSIS — E1151 Type 2 diabetes mellitus with diabetic peripheral angiopathy without gangrene: Secondary | ICD-10-CM

## 2021-03-03 DIAGNOSIS — I10 Essential (primary) hypertension: Secondary | ICD-10-CM | POA: Diagnosis not present

## 2021-03-03 DIAGNOSIS — M810 Age-related osteoporosis without current pathological fracture: Secondary | ICD-10-CM

## 2021-03-03 DIAGNOSIS — I480 Paroxysmal atrial fibrillation: Secondary | ICD-10-CM

## 2021-03-03 NOTE — Progress Notes (Signed)
Subjective: Sheila Morgan is a 87 y.o. female patient seen today for follow up of at risk foot care. She is seen for calluses of both feet as well as painful thick toenails that are difficult to trim. Pain interferes with ambulation. Aggravating factors include wearing enclosed shoe gear. Pain is relieved with periodic professional debridement.  Her daughter is present during today's visit. She states Mom did have test to rule of DVT and test was negative.  Daughter states Mom hasn't complained about about her RLE since we added the heel lift to her shoe.  New problems reported today: None.  Patient states their blood glucose was 109 mg/dl today.  PCP is Binnie Rail, MD. Last visit was: 01/13/2021.  No Known Allergies  Objective: Physical Exam  General: Patient is a pleasant 87 y.o. African American female in NAD. AAO x 3.   Neurovascular Examination: CFT <3 seconds b/l LE. Palpable DP pulse(s) b/l lower extremities Nonpalpable PT pulse(s) b/l lower extremities. Pedal hair sparse. Lower extremity skin temperature gradient within normal limits. No pain with calf compression RLE. No edema noted b/l LE.  Protective sensation diminished with 10g monofilament b/l.  Dermatological:  Pedal skin thin, shiny and atrophic b/l LE. No open wounds b/l LE. No interdigital macerations noted b/l LE. Toenails 1-5 b/l elongated, discolored, dystrophic, thickened, crumbly with subungual debris and tenderness to dorsal palpation. Hyperkeratotic lesion(s) submet head 5 b/l.  No erythema, no edema, no drainage, no fluctuance.  Musculoskeletal:  Normal muscle strength 5/5 to all lower extremity muscle groups bilaterally. Hammertoe deformity noted 2-5 b/l.  Assessment: 1. Pain due to onychomycosis of toenails of both feet   2. Callus   3. Type II diabetes mellitus with peripheral circulatory disorder [facility])    Plan: Patient was evaluated and treated and all questions answered. Consent given for  treatment as described below: -Examined patient. -She has heel lift in right shoe for equinus which has relieved her symptoms. -Continue diabetic foot care principles: inspect feet daily, monitor glucose as recommended by PCP and/or Endocrinologist, and follow prescribed diet per PCP, Endocrinologist and/or dietician. -Toenails 1-5 left and 1-5 right debrided in length and girth without iatrogenic bleeding with sterile nail nipper and dremel.  -Callus(es) submet head 5 b/l pared utilizing sterile scalpel blade without complication or incident. Total number debrided =2. -Patient/POA to call should there be question/concern in the interim.  Return in about 3 months (around 05/29/2021).  Marzetta Board, DPM

## 2021-03-03 NOTE — Telephone Encounter (Signed)
Patient's daughter was calling in to check on paperwork that was drop off to be fill out. Please advise

## 2021-03-03 NOTE — Telephone Encounter (Signed)
Returned call to patient's daughter [name].Left message on personal voice mail Dr.Berry's RN did receive patient assistance paper work.She will call you once completed and faxed.

## 2021-03-04 ENCOUNTER — Other Ambulatory Visit: Payer: Self-pay | Admitting: Internal Medicine

## 2021-03-05 ENCOUNTER — Telehealth: Payer: Self-pay

## 2021-03-05 NOTE — Telephone Encounter (Signed)
Sheila Morgan and [place] pt assistance forms filled out and signed by Dr. Gwenlyn Found. Faxed today. Informed daughter, [name] on this. She verbalizes understanding. Will inform daughter once we hear back from [employer].

## 2021-03-06 ENCOUNTER — Other Ambulatory Visit: Payer: Self-pay

## 2021-03-06 ENCOUNTER — Ambulatory Visit (INDEPENDENT_AMBULATORY_CARE_PROVIDER_SITE_OTHER): Payer: Medicare Other | Admitting: Pharmacist Clinician (PhC)/ Clinical Pharmacy Specialist

## 2021-03-06 VITALS — BP 168/80 | HR 76

## 2021-03-06 DIAGNOSIS — I10 Essential (primary) hypertension: Secondary | ICD-10-CM | POA: Diagnosis not present

## 2021-03-06 DIAGNOSIS — I2583 Coronary atherosclerosis due to lipid rich plaque: Secondary | ICD-10-CM

## 2021-03-06 DIAGNOSIS — I2510 Atherosclerotic heart disease of native coronary artery without angina pectoris: Secondary | ICD-10-CM

## 2021-03-06 MED ORDER — OLMESARTAN MEDOXOMIL 40 MG PO TABS: 40.0000 mg | ORAL_TABLET | Freq: Every day | ORAL | 3 refills | Status: DC

## 2021-03-06 NOTE — Patient Instructions (Signed)
Reach out to me via MyChart if you have any questions or concerns.     Go to the lab in 2 weeks (week of November 14)  Check your blood pressure at home daily and keep record of the readings.  Take your BP meds as follows:  Increase olmesartan to 40 mg daily  Bring all of your meds, your BP cuff and your record of home blood pressures to your next appointment.  Exercise as you're able, try to walk approximately 30 minutes per day.  Keep salt intake to a minimum, especially watch canned and prepared boxed foods.  Eat more fresh fruits and vegetables and fewer canned items.  Avoid eating in fast food restaurants.    HOW TO TAKE YOUR BLOOD PRESSURE: Rest 5 minutes before taking your blood pressure.  Don't smoke or drink caffeinated beverages for at least 30 minutes before. Take your blood pressure before (not after) you eat. Sit comfortably with your back supported and both feet on the floor (don't cross your legs). Elevate your arm to heart level on a table or a desk. Use the proper sized cuff. It should fit smoothly and snugly around your bare upper arm. There should be enough room to slip a fingertip under the cuff. The bottom edge of the cuff should be 1 inch above the crease of the elbow. Ideally, take 3 measurements at one sitting and record the average.

## 2021-03-06 NOTE — Progress Notes (Signed)
03/07/2021 COLA GANE 03-Sep-1932 [id number]   HPI:  Sheila Morgan is a 87 y.o. female patient of Dr Gwenlyn Found, with a PMH below who presents today for hypertension clinic evaluation.  She was seen by Dr. Gwenlyn Found in early August, at which time her pressure was noted to be 168/76.  He asked that she keep a BP diary for one month then come back for follow up.  At the follow up visit her home readings were noted to average 145/81.  We switched her losartan to olmesartan 20 mg and asked that she continue with monitoring and return in 6 weeks.    Today she returns for follow up with her daughter.  She states no recent changes to her health and no medication changes since she was last in our office.  At that time we switched the losartan to olmesartan 20.  She reports no concerns with medication change.  No complaints of CP, SOB, LEE.  Does endorse occasional dizziness, but nothing worse than in the past.    Past Medical History: ASCVD CABG x 1 1994 (LIMA to LAD), BMS to RCA 2002  Hyperlipidemia LDL 56 on simvastatin 40  AF Paroxysmal; CHADS2-VASc score 6, on Xarelto;   DVT 8/21 - unprovoked, now on Xarelto for AF     Blood Pressure Goal:  130/80  Current Medications: carvedilol 25 mg bid, amlodipine 5 mg qd, olmesartan 20 mg  Social Hx: no tobacco, no alcohol, occasional coffee, sweet tea  and soda most days  Diet: meals on wheels, does add some salt and other seasonings; gets vegetables most days with these  Exercise: none, uses walker  Home BP readings: we have not been able to verify the accuracy of her home meter.  Today - last 22 readings average 150/84  Last visit - 18 readings averaged 145/81  Intolerances: nkda  Labs: 8/22: Na 143, K 4.1, Glu 129, BUN 12, SCr 1.12, GFR 48   Wt Readings from Last 3 Encounters:  01/23/21 157 lb (71.2 kg)  01/13/21 157 lb (71.2 kg)  12/31/20 157 lb (71.2 kg)   BP Readings from Last 3 Encounters:  03/06/21 (!) 168/80  01/23/21 (!)  164/82  01/13/21 (!) 146/80   Pulse Readings from Last 3 Encounters:  03/06/21 76  01/23/21 76  01/13/21 72    Current Outpatient Medications  Medication Sig Dispense Refill   olmesartan (BENICAR) 40 MG tablet Take 1 tablet (40 mg total) by mouth daily. 90 tablet 3   acetaminophen (TYLENOL) 325 MG tablet Take 2 tablets (650 mg total) by mouth every 6 (six) hours as needed for mild pain, fever or headache.     alendronate (FOSAMAX) 70 MG tablet Take 70 mg by mouth every Saturday. Take with a full glass of water on an empty stomach.     amLODipine (NORVASC) 5 MG tablet TAKE 1 AND 1/2 TABLETS(7.5 MG) BY MOUTH DAILY 135 tablet 2   Calcium Carbonate-Vitamin D (CALCIUM 600-D PO) Take 1 tablet by mouth 2 (two) times daily.     carvedilol (COREG) 25 MG tablet TAKE 1 TABLET BY MOUTH TWICE DAILY WITH A MEAL 180 tablet 1   colchicine 0.6 MG tablet Take 1 tablet by mouth every other day.     furosemide (LASIX) 20 MG tablet TAKE 1 TABLET(20 MG) BY MOUTH DAILY 90 tablet 1   glucose blood test strip OneTouch Ultra Test strips  USE TO TEST BLOOD SUGAR TWICE DAILY     isosorbide  mononitrate (IMDUR) 30 MG 24 hr tablet TAKE 1 TABLET(30 MG) BY MOUTH DAILY 90 tablet 1   Lancets (ONETOUCH DELICA PLUS [id number]) MISC USE AS DIRECTED TO TEST BLOOD SUGAR TWICE DAILY 200 each 5   levothyroxine (SYNTHROID) 50 MCG tablet TAKE 1 TABLET(50 MCG) BY MOUTH DAILY 90 tablet 1   metFORMIN (GLUCOPHAGE) 500 MG tablet TAKE 1/2 TABLET(250 MG) BY MOUTH DAILY WITH LARGEST MEAL 45 tablet 2   MULTIPLE VITAMIN PO See admin instructions.     ONETOUCH ULTRA test strip USE TO TEST BLOOD SUGAR TWICE DAILY 200 strip 3   predniSONE (DELTASONE) 5 MG tablet Take 5 mg by mouth daily with breakfast.     rivaroxaban (XARELTO) 20 MG TABS tablet Take 1 tablet (20 mg total) by mouth daily with supper. 30 tablet 6   simvastatin (ZOCOR) 40 MG tablet TAKE 1 TABLET BY MOUTH IN THE EVENING 90 tablet 1   vitamin B-12 (CYANOCOBALAMIN) 1000 MCG tablet  Take 1,000 mcg by mouth daily.     No current facility-administered medications for this visit.    No Known Allergies  Past Medical History:  Diagnosis Date   Arthritis    Cancer ([place])    skin on nose .  mylenoma   Coronary artery disease    status post post LAD angioplasty in 1993  and subsequent coronary artery bypass grafting x1 with a LIMA to the LAD July of 1994. She had stenting of her RCA with a bare-metal stent November 2002.   Diabetes mellitus    2   Gout    Heart murmur    Hematoma    Right groin   Hemorrhoids    Hiatal hernia    History of blood transfusion    HOH (hard of hearing)    Hyperlipidemia    Hypertension    Left knee DJD    Lightheadedness    has seen Dr Gwenlyn Found and ENT.. cant find out why   Myocardial infarction [facility])    Osteoporosis    PONV (postoperative nausea and vomiting)    Shingles 10/27/2013    Blood pressure (!) 168/80, pulse 76.  Essential hypertension Will increase olmesartan to 40 mg daily.  She should continue with all other medications.  She is scheduled to see Caron Presume PA in 3 months.  Daughter aware to reach out to [place] should they have any concerns in the interim.  Patient will get BMET drawn in 10-14 days.    Tommy Medal PharmD CPP [facility] [address] [place], [place] [address] [phone]

## 2021-03-07 ENCOUNTER — Encounter: Payer: Self-pay | Admitting: Pharmacist Clinician (PhC)/ Clinical Pharmacy Specialist

## 2021-03-07 NOTE — Assessment & Plan Note (Signed)
Will increase olmesartan to 40 mg daily.  She should continue with all other medications.  She is scheduled to see Caron Presume PA in 3 months.  Daughter aware to reach out to [place] should they have any concerns in the interim.  Patient will get BMET drawn in 10-14 days.

## 2021-03-20 DIAGNOSIS — M8589 Other specified disorders of bone density and structure, multiple sites: Secondary | ICD-10-CM | POA: Diagnosis not present

## 2021-03-20 DIAGNOSIS — Z780 Asymptomatic menopausal state: Secondary | ICD-10-CM | POA: Diagnosis not present

## 2021-04-05 ENCOUNTER — Inpatient Hospital Stay (HOSPITAL_COMMUNITY)
Admission: EM | Admit: 2021-04-05 | Discharge: 2021-04-17 | DRG: 871 | Disposition: A | Discharge status: Skilled Nursing Facility | Payer: Medicare Other | Attending: Internal Medicine | Admitting: Internal Medicine

## 2021-04-05 ENCOUNTER — Emergency Department (HOSPITAL_COMMUNITY): Payer: Medicare Other

## 2021-04-05 ENCOUNTER — Other Ambulatory Visit: Payer: Self-pay

## 2021-04-05 ENCOUNTER — Encounter (HOSPITAL_COMMUNITY): Payer: Self-pay | Admitting: Emergency Medicine

## 2021-04-05 ENCOUNTER — Encounter: Payer: Self-pay | Admitting: Internal Medicine

## 2021-04-05 DIAGNOSIS — A419 Sepsis, unspecified organism: Secondary | ICD-10-CM | POA: Diagnosis not present

## 2021-04-05 DIAGNOSIS — J159 Unspecified bacterial pneumonia: Secondary | ICD-10-CM | POA: Diagnosis present

## 2021-04-05 DIAGNOSIS — N1831 Chronic kidney disease, stage 3a: Secondary | ICD-10-CM | POA: Diagnosis present

## 2021-04-05 DIAGNOSIS — E1165 Type 2 diabetes mellitus with hyperglycemia: Secondary | ICD-10-CM | POA: Diagnosis present

## 2021-04-05 DIAGNOSIS — Z823 Family history of stroke: Secondary | ICD-10-CM

## 2021-04-05 DIAGNOSIS — Z7984 Long term (current) use of oral hypoglycemic drugs: Secondary | ICD-10-CM

## 2021-04-05 DIAGNOSIS — H9190 Unspecified hearing loss, unspecified ear: Secondary | ICD-10-CM | POA: Diagnosis present

## 2021-04-05 DIAGNOSIS — J90 Pleural effusion, not elsewhere classified: Secondary | ICD-10-CM | POA: Diagnosis not present

## 2021-04-05 DIAGNOSIS — T380X5A Adverse effect of glucocorticoids and synthetic analogues, initial encounter: Secondary | ICD-10-CM | POA: Diagnosis not present

## 2021-04-05 DIAGNOSIS — R1312 Dysphagia, oropharyngeal phase: Secondary | ICD-10-CM | POA: Diagnosis not present

## 2021-04-05 DIAGNOSIS — Z7983 Long term (current) use of bisphosphonates: Secondary | ICD-10-CM

## 2021-04-05 DIAGNOSIS — E1122 Type 2 diabetes mellitus with diabetic chronic kidney disease: Secondary | ICD-10-CM | POA: Diagnosis present

## 2021-04-05 DIAGNOSIS — Z951 Presence of aortocoronary bypass graft: Secondary | ICD-10-CM

## 2021-04-05 DIAGNOSIS — U071 COVID-19: Secondary | ICD-10-CM | POA: Diagnosis present

## 2021-04-05 DIAGNOSIS — I480 Paroxysmal atrial fibrillation: Secondary | ICD-10-CM | POA: Diagnosis present

## 2021-04-05 DIAGNOSIS — I10 Essential (primary) hypertension: Secondary | ICD-10-CM | POA: Diagnosis not present

## 2021-04-05 DIAGNOSIS — N179 Acute kidney failure, unspecified: Secondary | ICD-10-CM | POA: Diagnosis not present

## 2021-04-05 DIAGNOSIS — Z7901 Long term (current) use of anticoagulants: Secondary | ICD-10-CM

## 2021-04-05 DIAGNOSIS — I252 Old myocardial infarction: Secondary | ICD-10-CM

## 2021-04-05 DIAGNOSIS — Z743 Need for continuous supervision: Secondary | ICD-10-CM | POA: Diagnosis not present

## 2021-04-05 DIAGNOSIS — J1282 Pneumonia due to coronavirus disease 2019: Secondary | ICD-10-CM | POA: Diagnosis not present

## 2021-04-05 DIAGNOSIS — I2510 Atherosclerotic heart disease of native coronary artery without angina pectoris: Secondary | ICD-10-CM | POA: Diagnosis not present

## 2021-04-05 DIAGNOSIS — E11649 Type 2 diabetes mellitus with hypoglycemia without coma: Secondary | ICD-10-CM | POA: Diagnosis not present

## 2021-04-05 DIAGNOSIS — D72829 Elevated white blood cell count, unspecified: Secondary | ICD-10-CM

## 2021-04-05 DIAGNOSIS — A4189 Other specified sepsis: Principal | ICD-10-CM | POA: Diagnosis present

## 2021-04-05 DIAGNOSIS — R0902 Hypoxemia: Secondary | ICD-10-CM

## 2021-04-05 DIAGNOSIS — E785 Hyperlipidemia, unspecified: Secondary | ICD-10-CM | POA: Diagnosis not present

## 2021-04-05 DIAGNOSIS — M810 Age-related osteoporosis without current pathological fracture: Secondary | ICD-10-CM | POA: Diagnosis present

## 2021-04-05 DIAGNOSIS — Z833 Family history of diabetes mellitus: Secondary | ICD-10-CM

## 2021-04-05 DIAGNOSIS — Z955 Presence of coronary angioplasty implant and graft: Secondary | ICD-10-CM

## 2021-04-05 DIAGNOSIS — R2689 Other abnormalities of gait and mobility: Secondary | ICD-10-CM | POA: Diagnosis not present

## 2021-04-05 DIAGNOSIS — R1311 Dysphagia, oral phase: Secondary | ICD-10-CM | POA: Diagnosis not present

## 2021-04-05 DIAGNOSIS — M6259 Muscle wasting and atrophy, not elsewhere classified, multiple sites: Secondary | ICD-10-CM | POA: Diagnosis not present

## 2021-04-05 DIAGNOSIS — D6959 Other secondary thrombocytopenia: Secondary | ICD-10-CM | POA: Diagnosis present

## 2021-04-05 DIAGNOSIS — I5032 Chronic diastolic (congestive) heart failure: Secondary | ICD-10-CM | POA: Diagnosis not present

## 2021-04-05 DIAGNOSIS — Z96641 Presence of right artificial hip joint: Secondary | ICD-10-CM | POA: Diagnosis present

## 2021-04-05 DIAGNOSIS — R4182 Altered mental status, unspecified: Secondary | ICD-10-CM | POA: Diagnosis not present

## 2021-04-05 DIAGNOSIS — I700 Atherosclerosis of aorta: Secondary | ICD-10-CM | POA: Diagnosis not present

## 2021-04-05 DIAGNOSIS — U099 Post covid-19 condition, unspecified: Secondary | ICD-10-CM | POA: Diagnosis not present

## 2021-04-05 DIAGNOSIS — Z8616 Personal history of COVID-19: Secondary | ICD-10-CM

## 2021-04-05 DIAGNOSIS — I130 Hypertensive heart and chronic kidney disease with heart failure and stage 1 through stage 4 chronic kidney disease, or unspecified chronic kidney disease: Secondary | ICD-10-CM | POA: Diagnosis present

## 2021-04-05 DIAGNOSIS — E039 Hypothyroidism, unspecified: Secondary | ICD-10-CM | POA: Diagnosis present

## 2021-04-05 DIAGNOSIS — E860 Dehydration: Secondary | ICD-10-CM | POA: Diagnosis present

## 2021-04-05 DIAGNOSIS — M6281 Muscle weakness (generalized): Secondary | ICD-10-CM | POA: Diagnosis not present

## 2021-04-05 DIAGNOSIS — R404 Transient alteration of awareness: Secondary | ICD-10-CM | POA: Diagnosis not present

## 2021-04-05 DIAGNOSIS — Z79890 Hormone replacement therapy (postmenopausal): Secondary | ICD-10-CM

## 2021-04-05 DIAGNOSIS — R278 Other lack of coordination: Secondary | ICD-10-CM | POA: Diagnosis not present

## 2021-04-05 DIAGNOSIS — M353 Polymyalgia rheumatica: Secondary | ICD-10-CM | POA: Diagnosis not present

## 2021-04-05 DIAGNOSIS — R41841 Cognitive communication deficit: Secondary | ICD-10-CM | POA: Diagnosis not present

## 2021-04-05 DIAGNOSIS — M109 Gout, unspecified: Secondary | ICD-10-CM | POA: Diagnosis present

## 2021-04-05 DIAGNOSIS — Z7952 Long term (current) use of systemic steroids: Secondary | ICD-10-CM

## 2021-04-05 DIAGNOSIS — Z96652 Presence of left artificial knee joint: Secondary | ICD-10-CM | POA: Diagnosis present

## 2021-04-05 DIAGNOSIS — R262 Difficulty in walking, not elsewhere classified: Secondary | ICD-10-CM | POA: Diagnosis not present

## 2021-04-05 DIAGNOSIS — Z8249 Family history of ischemic heart disease and other diseases of the circulatory system: Secondary | ICD-10-CM

## 2021-04-05 DIAGNOSIS — E876 Hypokalemia: Secondary | ICD-10-CM | POA: Diagnosis not present

## 2021-04-05 DIAGNOSIS — R531 Weakness: Secondary | ICD-10-CM | POA: Diagnosis not present

## 2021-04-05 DIAGNOSIS — I4891 Unspecified atrial fibrillation: Secondary | ICD-10-CM | POA: Diagnosis not present

## 2021-04-05 DIAGNOSIS — Z79899 Other long term (current) drug therapy: Secondary | ICD-10-CM

## 2021-04-05 DIAGNOSIS — R54 Age-related physical debility: Secondary | ICD-10-CM | POA: Diagnosis present

## 2021-04-05 DIAGNOSIS — Z85820 Personal history of malignant melanoma of skin: Secondary | ICD-10-CM

## 2021-04-05 DIAGNOSIS — G9341 Metabolic encephalopathy: Secondary | ICD-10-CM | POA: Diagnosis present

## 2021-04-05 DIAGNOSIS — E1151 Type 2 diabetes mellitus with diabetic peripheral angiopathy without gangrene: Secondary | ICD-10-CM | POA: Diagnosis not present

## 2021-04-05 LAB — CBC WITH DIFFERENTIAL/PLATELET
Abs Immature Granulocytes: 0.06 10*3/uL (ref 0.00–0.07)
Basophils Absolute: 0.1 10*3/uL (ref 0.0–0.1)
Basophils Relative: 0 %
Eosinophils Absolute: 0.1 10*3/uL (ref 0.0–0.5)
Eosinophils Relative: 0 %
HCT: 38.8 % (ref 36.0–46.0)
Hemoglobin: 12.9 g/dL (ref 12.0–15.0)
Immature Granulocytes: 1 %
Lymphocytes Relative: 4 %
Lymphs Abs: 0.5 10*3/uL — ABNORMAL LOW (ref 0.7–4.0)
MCH: 30.6 pg (ref 26.0–34.0)
MCHC: 33.2 g/dL (ref 30.0–36.0)
MCV: 92.2 fL (ref 80.0–100.0)
Monocytes Absolute: 1.1 10*3/uL — ABNORMAL HIGH (ref 0.1–1.0)
Monocytes Relative: 9 %
Neutro Abs: 11.5 10*3/uL — ABNORMAL HIGH (ref 1.7–7.7)
Neutrophils Relative %: 86 %
Platelets: 164 10*3/uL (ref 150–400)
RBC: 4.21 MIL/uL (ref 3.87–5.11)
RDW: 13.5 % (ref 11.5–15.5)
WBC: 13.2 10*3/uL — ABNORMAL HIGH (ref 4.0–10.5)
nRBC: 0 % (ref 0.0–0.2)

## 2021-04-05 LAB — LACTIC ACID, PLASMA
Lactic Acid, Venous: 1.8 mmol/L (ref 0.5–1.9)
Lactic Acid, Venous: 1.6 mmol/L (ref 0.5–1.9)
Lactic Acid, Venous: 0.7 mmol/L (ref 0.5–1.9)
Lactic Acid, Venous: 2.9 mmol/L (ref 0.5–1.9)

## 2021-04-05 LAB — RESP PANEL BY RT-PCR (FLU A&B, COVID) ARPGX2
Influenza A by PCR: NEGATIVE
Influenza B by PCR: NEGATIVE
SARS Coronavirus 2 by RT PCR: POSITIVE — AB

## 2021-04-05 LAB — URINALYSIS, ROUTINE W REFLEX MICROSCOPIC
Bacteria, UA: NONE SEEN
Bilirubin Urine: NEGATIVE
Glucose, UA: NEGATIVE mg/dL
Hgb urine dipstick: NEGATIVE
Ketones, ur: NEGATIVE mg/dL
Leukocytes,Ua: NEGATIVE
Nitrite: NEGATIVE
Protein, ur: >=300 mg/dL — AB
Specific Gravity, Urine: 1.011 (ref 1.005–1.030)
pH: 8 (ref 5.0–8.0)

## 2021-04-05 LAB — COMPREHENSIVE METABOLIC PANEL
ALT: 18 U/L (ref 0–44)
AST: 26 U/L (ref 15–41)
Albumin: 4 g/dL (ref 3.5–5.0)
Alkaline Phosphatase: 71 U/L (ref 38–126)
Anion gap: 11 (ref 5–15)
BUN: 11 mg/dL (ref 8–23)
CO2: 27 mmol/L (ref 22–32)
Calcium: 10.7 mg/dL — ABNORMAL HIGH (ref 8.9–10.3)
Chloride: 99 mmol/L (ref 98–111)
Creatinine, Ser: 1.13 mg/dL — ABNORMAL HIGH (ref 0.44–1.00)
GFR, Estimated: 47 mL/min — ABNORMAL LOW (ref >60)
Glucose, Bld: 167 mg/dL — ABNORMAL HIGH (ref 70–99)
Potassium: 3.6 mmol/L (ref 3.5–5.1)
Sodium: 137 mmol/L (ref 135–145)
Total Bilirubin: 1 mg/dL (ref 0.3–1.2)
Total Protein: 7.3 g/dL (ref 6.5–8.1)

## 2021-04-05 LAB — HEMOGLOBIN A1C
Hgb A1c MFr Bld: 7.2 % — ABNORMAL HIGH (ref 4.8–5.6)
Mean Plasma Glucose: 159.94 mg/dL

## 2021-04-05 LAB — PROCALCITONIN: Procalcitonin: 0.22 ng/mL

## 2021-04-05 LAB — GLUCOSE, CAPILLARY: Glucose-Capillary: 134 mg/dL — ABNORMAL HIGH (ref 70–99)

## 2021-04-05 LAB — PROTIME-INR
INR: 2 — ABNORMAL HIGH (ref 0.8–1.2)
Prothrombin Time: 22.6 seconds — ABNORMAL HIGH (ref 11.4–15.2)

## 2021-04-05 LAB — APTT: aPTT: 35 seconds (ref 24–36)

## 2021-04-05 MED ORDER — ACETAMINOPHEN 650 MG RE SUPP: 650.0000 mg | Freq: Once | RECTAL | Status: DC

## 2021-04-05 MED ORDER — RIVAROXABAN 20 MG PO TABS
20.0000 mg | ORAL_TABLET | Freq: Every day | ORAL | Status: DC
  Administered 2021-04-05 – 2021-04-06 (×2): 20 mg via ORAL
  Filled 2021-04-05 (×3): qty 1

## 2021-04-05 MED ORDER — ACETAMINOPHEN 650 MG RE SUPP
650.0000 mg | Freq: Four times a day (QID) | RECTAL | Status: DC | PRN
  Administered 2021-04-05 – 2021-04-06 (×2): 650 mg via RECTAL
  Filled 2021-04-05 (×2): qty 1

## 2021-04-05 MED ORDER — ENOXAPARIN SODIUM 40 MG/0.4ML IJ SOSY: 40.0000 mg | PREFILLED_SYRINGE | INTRAMUSCULAR | Status: DC

## 2021-04-05 MED ORDER — SODIUM CHLORIDE 0.9 % IV BOLUS
500.0000 mL | Freq: Once | INTRAVENOUS | Status: AC
  Administered 2021-04-05: 500 mL via INTRAVENOUS

## 2021-04-05 MED ORDER — INSULIN ASPART 100 UNIT/ML IJ SOLN
0.0000 [IU] | Freq: Three times a day (TID) | INTRAMUSCULAR | Status: DC
  Administered 2021-04-07: 1 [IU] via SUBCUTANEOUS
  Administered 2021-04-07: 2 [IU] via SUBCUTANEOUS
  Administered 2021-04-08: 1 [IU] via SUBCUTANEOUS
  Administered 2021-04-08: 2 [IU] via SUBCUTANEOUS
  Administered 2021-04-08 – 2021-04-09 (×2): 3 [IU] via SUBCUTANEOUS
  Administered 2021-04-09: 2 [IU] via SUBCUTANEOUS
  Administered 2021-04-09: 1 [IU] via SUBCUTANEOUS
  Filled 2021-04-05: qty 0.06

## 2021-04-05 MED ORDER — METRONIDAZOLE 500 MG/100ML IV SOLN
500.0000 mg | Freq: Once | INTRAVENOUS | Status: AC
  Administered 2021-04-05: 500 mg via INTRAVENOUS
  Filled 2021-04-05: qty 100

## 2021-04-05 MED ORDER — CEFEPIME HCL 2 G IJ SOLR
2.0000 g | Freq: Once | INTRAMUSCULAR | Status: AC
  Administered 2021-04-05: 2 g via INTRAVENOUS
  Filled 2021-04-05: qty 2

## 2021-04-05 MED ORDER — LEVOTHYROXINE SODIUM 50 MCG PO TABS
50.0000 ug | ORAL_TABLET | Freq: Every day | ORAL | Status: DC
  Administered 2021-04-06 – 2021-04-17 (×11): 50 ug via ORAL
  Filled 2021-04-05 (×11): qty 1

## 2021-04-05 MED ORDER — ACETAMINOPHEN 650 MG RE SUPP
650.0000 mg | Freq: Once | RECTAL | Status: AC
  Administered 2021-04-05: 650 mg via RECTAL
  Filled 2021-04-05: qty 1

## 2021-04-05 MED ORDER — NIRMATRELVIR/RITONAVIR (PAXLOVID)TABLET: 3.0000 | ORAL_TABLET | Freq: Two times a day (BID) | ORAL | Status: DC

## 2021-04-05 MED ORDER — CARVEDILOL 25 MG PO TABS
25.0000 mg | ORAL_TABLET | Freq: Two times a day (BID) | ORAL | Status: DC
  Administered 2021-04-05 – 2021-04-17 (×23): 25 mg via ORAL
  Filled 2021-04-05 (×23): qty 1

## 2021-04-05 MED ORDER — VANCOMYCIN HCL 1250 MG/250ML IV SOLN
1250.0000 mg | Freq: Once | INTRAVENOUS | Status: AC
  Administered 2021-04-05: 1250 mg via INTRAVENOUS
  Filled 2021-04-05: qty 250

## 2021-04-05 MED ORDER — SODIUM CHLORIDE 0.9 % IV SOLN: INTRAVENOUS | Status: DC

## 2021-04-05 MED ORDER — LACTATED RINGERS IV SOLN: INTRAVENOUS | Status: AC

## 2021-04-05 MED ORDER — MOLNUPIRAVIR EUA 200MG CAPSULE
4.0000 | ORAL_CAPSULE | Freq: Two times a day (BID) | ORAL | Status: DC
  Administered 2021-04-05 – 2021-04-10 (×9): 800 mg via ORAL
  Filled 2021-04-05: qty 4

## 2021-04-05 NOTE — H&P (Signed)
History and Physical    Sheila Morgan QVZ:[id number] DOB: 17-Apr-1933 DOA: 04/05/2021  PCP: Binnie Rail, MD Patient coming from: Home  Chief Complaint: Change in mental status  HPI: Sheila Morgan is a 87 y.o. female with medical history significant of type 2 diabetes, hypothyroidism, hypertension, CAD, CABG, prior history of COVID has taken COVID vaccinations and monoclonal antibodies family called EMS as patient was acting different.  Family watches her through the camera and noticed that she was acting different and confused.  When they went to check on her they found her very weak and confused with fever. Complains of cough, denies shortness of breath nausea vomiting or diarrhea. Family thought she had foul-smelling urine and increased urinary frequency.  Family noticed that patient was struggling to get out of the chair. Her p.o. intake has been poor with decreased appetite. History is obtained from the patient's daughter as patient is confused and unable to provide any relevant information.    ED Course: Patient received vancomycin and cefepime.  Blood pressure 170/80 tachycardic heart rate over 110 sats 97% on room air febrile 101.2.  Lactic acid 1.8 WBC 13.2 Chest x-ray clear CT head with no acute findings atrophy and small vessel disease.  Review of Systems: As per HPI otherwise all other systems reviewed and are negative  Ambulatory Status: At baseline she walks with a walker.  Past Medical History:  Diagnosis Date   Arthritis    Cancer ([place])    skin on nose .  mylenoma   Coronary artery disease    status post post LAD angioplasty in 1993  and subsequent coronary artery bypass grafting x1 with a LIMA to the LAD July of 1994. She had stenting of her RCA with a bare-metal stent November 2002.   Diabetes mellitus    2   Gout    Heart murmur    Hematoma    Right groin   Hemorrhoids    Hiatal hernia    History of blood transfusion    HOH (hard of hearing)     Hyperlipidemia    Hypertension    Left knee DJD    Lightheadedness    has seen Dr Gwenlyn Found and ENT.. cant find out why   Myocardial infarction [facility])    Osteoporosis    PONV (postoperative nausea and vomiting)    Shingles 10/27/2013    Past Surgical History:  Procedure Laterality Date   CORONARY ANGIOPLASTY  1993   LAD   CORONARY ANGIOPLASTY WITH STENT PLACEMENT  03/09/01   RCA   CORONARY ARTERY BYPASS GRAFT  1994   JOINT REPLACEMENT Right 1998   hip   NM MYOVIEW LTD  03/2012   Normal   SKIN GRAFT     to nose skin cancer   TOTAL HIP ARTHROPLASTY     TOTAL KNEE ARTHROPLASTY Left 10/09/2013   Procedure: TOTAL KNEE ARTHROPLASTY;  Surgeon: Lorn Junes, MD;  Location: [place];  Service: Orthopedics;  Laterality: Left;    Social History   Socioeconomic History   Marital status: Widowed    Spouse name: Not on file   Number of children: 2   Years of education: Not on file   Highest education level: 12th grade  Occupational History   Occupation: Retired   Tobacco Use   Smoking status: Never   Smokeless tobacco: Never  Vaping Use   Vaping Use: Never used  Substance and Sexual Activity   Alcohol use: No   Drug use: No  Sexual activity: Never  Other Topics Concern   Not on file  Social History Narrative   Left handed    Caffeine 4 cups daily    Lives at home alone    Social Determinants of Health   Financial Resource Strain: Not on file  Food Insecurity: Not on file  Transportation Needs: Not on file  Physical Activity: Not on file  Stress: Not on file  Social Connections: Not on file  Intimate Partner Violence: Not on file    No Known Allergies  Family History  Problem Relation Age of Onset   Heart disease Father    Cancer Sister        breast   Diabetes Sister    Heart disease Brother    Heart disease Brother    Heart disease Brother    Diabetes Sister    Stroke Mother       Prior to Admission medications   Medication Sig Start Date End Date Taking?  Authorizing Provider  acetaminophen (TYLENOL) 325 MG tablet Take 2 tablets (650 mg total) by mouth every 6 (six) hours as needed for mild pain, fever or headache. 07/19/18   Rai, Vernelle [name], MD  alendronate (FOSAMAX) 70 MG tablet Take 70 mg by mouth every Saturday. Take with a full glass of water on an empty stomach.    [provider]  amLODipine (NORVASC) 5 MG tablet TAKE 1 AND 1/2 TABLETS(7.5 MG) BY MOUTH DAILY 09/04/20   Binnie Rail, MD  Calcium Carbonate-Vitamin D (CALCIUM 600-D PO) Take 1 tablet by mouth 2 (two) times daily.    [provider]  carvedilol (COREG) 25 MG tablet TAKE 1 TABLET BY MOUTH TWICE DAILY WITH A MEAL 11/25/20   Burns, Claudina Lick, MD  colchicine 0.6 MG tablet Take 1 tablet by mouth every other day.    [provider]  furosemide (LASIX) 20 MG tablet TAKE 1 TABLET(20 MG) BY MOUTH DAILY 03/04/21   Burns, Claudina Lick, MD  glucose blood test strip OneTouch Ultra Test strips  USE TO TEST BLOOD SUGAR TWICE DAILY    [provider]  isosorbide mononitrate (IMDUR) 30 MG 24 hr tablet TAKE 1 TABLET(30 MG) BY MOUTH DAILY 02/17/21   Burns, Claudina Lick, MD  Lancets (ONETOUCH DELICA PLUS [id number]) MISC USE AS DIRECTED TO TEST BLOOD SUGAR TWICE DAILY 11/04/20   Binnie Rail, MD  levothyroxine (SYNTHROID) 50 MCG tablet TAKE 1 TABLET(50 MCG) BY MOUTH DAILY 09/05/20   Binnie Rail, MD  metFORMIN (GLUCOPHAGE) 500 MG tablet TAKE 1/2 TABLET(250 MG) BY MOUTH DAILY WITH LARGEST MEAL 09/04/20   Burns, Claudina Lick, MD  MULTIPLE VITAMIN PO See admin instructions.    [provider]  olmesartan (BENICAR) 40 MG tablet Take 1 tablet (40 mg total) by mouth daily. 03/06/21   Lorretta Harp, MD  ONETOUCH ULTRA test strip USE TO TEST BLOOD SUGAR TWICE DAILY 05/09/20   Binnie Rail, MD  predniSONE (DELTASONE) 5 MG tablet Take 5 mg by mouth daily with breakfast.    [provider]  rivaroxaban (XARELTO) 20 MG TABS tablet Take 1 tablet (20 mg total) by mouth daily with  supper. 12/10/20   Warren Lacy, PA-C  simvastatin (ZOCOR) 40 MG tablet TAKE 1 TABLET BY MOUTH IN THE EVENING 01/14/21   Lorretta Harp, MD  vitamin B-12 (CYANOCOBALAMIN) 1000 MCG tablet Take 1,000 mcg by mouth daily.    [provider]    Physical Exam: Vitals:  04/05/21 1440 04/05/21 1445 04/05/21 1531 04/05/21 1615  BP:  (!) 149/69  (!) 175/82  Pulse:  86  (!) 107  Resp:  20  20  Temp: (!) 101.8 F (38.8 C)     TempSrc: Rectal     SpO2:  96%  98%  Weight:   71.2 kg      General: Appears ill appearing confused moves all extremities Eyes:  PERRL, EOMI, normal lids, iris ENT:  grossly normal hearing, lips & tongue, mmm Neck:  no LAD, masses or thyromegaly Cardiovascular:  RRR, no m/r/g. No LE edema.  Respiratory:  CTA bilaterally, no w/r/r. Normal respiratory effort. Abdomen:  soft, ntnd, NABS Skin:  no rash or induration seen on limited exam Musculoskeletal: grossly normal tone BUE/BLE, good ROM, no bony abnormality Psychiatric:  grossly normal mood and affect, speech fluent and appropriate, AOx3 Neurologic:  CN 2-12 grossly intact, moves all extremities in coordinated fashion, sensation intact  Labs on Admission: I have personally reviewed following labs and imaging studies  CBC: Recent Labs  Lab 04/05/21 1303  WBC 13.2*  NEUTROABS 11.5*  HGB 12.9  HCT 38.8  MCV 92.2  PLT 937   Basic Metabolic Panel: Recent Labs  Lab 04/05/21 1303  NA 137  K 3.6  CL 99  CO2 27  GLUCOSE 167*  BUN 11  CREATININE 1.13*  CALCIUM 10.7*   GFR: Estimated Creatinine Clearance: 34.8 mL/min (A) (by C-G formula based on SCr of 1.13 mg/dL (H)). Liver Function Tests: Recent Labs  Lab 04/05/21 1303  AST 26  ALT 18  ALKPHOS 71  BILITOT 1.0  PROT 7.3  ALBUMIN 4.0   No results for input(s): LIPASE, AMYLASE in the last 168 hours. No results for input(s): AMMONIA in the last 168 hours. Coagulation Profile: Recent Labs  Lab 04/05/21 1303  INR 2.0*   Cardiac  Enzymes: No results for input(s): CKTOTAL, CKMB, CKMBINDEX, TROPONINI in the last 168 hours. BNP (last 3 results) No results for input(s): PROBNP in the last 8760 hours. HbA1C: No results for input(s): HGBA1C in the last 72 hours. CBG: No results for input(s): GLUCAP in the last 168 hours. Lipid Profile: No results for input(s): CHOL, HDL, LDLCALC, TRIG, CHOLHDL, LDLDIRECT in the last 72 hours. Thyroid Function Tests: No results for input(s): TSH, T4TOTAL, FREET4, T3FREE, THYROIDAB in the last 72 hours. Anemia Panel: No results for input(s): VITAMINB12, FOLATE, FERRITIN, TIBC, IRON, RETICCTPCT in the last 72 hours. Urine analysis:    Component Value Date/Time   COLORURINE YELLOW 02/22/2019 1014   APPEARANCEUR CLEAR 02/22/2019 1014   LABSPEC 1.010 02/22/2019 1014   PHURINE 6.5 02/22/2019 1014   GLUCOSEU NEGATIVE 02/22/2019 1014   HGBUR NEGATIVE 02/22/2019 [place] 02/22/2019 1014   KETONESUR NEGATIVE 02/22/2019 1014   PROTEINUR 100 (A) 07/14/2018 1510   UROBILINOGEN 0.2 02/22/2019 1014   NITRITE NEGATIVE 02/22/2019 1014   LEUKOCYTESUR TRACE (A) 02/22/2019 1014    Creatinine Clearance: Estimated Creatinine Clearance: 34.8 mL/min (A) (by C-G formula based on SCr of 1.13 mg/dL (H)).  Sepsis Labs: @LABRCNTIP (procalcitonin:4,lacticidven:4) ) Recent Results (from the past 240 hour(s))  Resp Panel by RT-PCR (Flu A&B, Covid)     Status: Abnormal   Collection Time: 04/05/21  1:03 PM   Specimen: Nasopharyngeal(NP) swabs in vial transport medium  Result Value Ref Range Status   SARS Coronavirus 2 by RT PCR POSITIVE (A) NEGATIVE Final    Comment: RESULT CALLED TO, READ BACK BY AND VERIFIED WITH: GARRISON G.  RN ON 04/05/2021 @ 1556 BY MECIAL J. (NOTE) SARS-CoV-2 target nucleic acids are DETECTED.  The SARS-CoV-2 RNA is generally detectable in upper respiratory specimens during the acute phase of infection. Positive results are indicative of the presence of the  identified virus, but do not rule out bacterial infection or co-infection with other pathogens not detected by the test. Clinical correlation with patient history and other diagnostic information is necessary to determine patient infection status. The expected result is Negative.  Fact Sheet for Patients: EntrepreneurPulse.com.au  Fact Sheet for Healthcare Providers: IncredibleEmployment.be  This test is not yet approved or cleared by the [place] FDA and  has been authorized for detection and/or diagnosis of SARS-CoV-2 by FDA under an Emergency Use Authorization (EUA).  This EUA will remain in effect (meaning t his test can be used) for the duration of  the COVID-19 declaration under Section 564(b)(1) of the Act, 21 U.S.C. section 360bbb-3(b)(1), unless the authorization is terminated or revoked sooner.     Influenza A by PCR NEGATIVE NEGATIVE Final   Influenza B by PCR NEGATIVE NEGATIVE Final    Comment: (NOTE) The Xpert Xpress SARS-CoV-2/FLU/RSV plus assay is intended as an aid in the diagnosis of influenza from Nasopharyngeal swab specimens and should not be used as a sole basis for treatment. Nasal washings and aspirates are unacceptable for Xpert Xpress SARS-CoV-2/FLU/RSV testing.  Fact Sheet for Patients: EntrepreneurPulse.com.au  Fact Sheet for Healthcare Providers: IncredibleEmployment.be  This test is not yet approved or cleared by the [place] FDA and has been authorized for detection and/or diagnosis of SARS-CoV-2 by FDA under an Emergency Use Authorization (EUA). This EUA will remain in effect (meaning this test can be used) for the duration of the COVID-19 declaration under Section 564(b)(1) of the Act, 21 U.S.C. section 360bbb-3(b)(1), unless the authorization is terminated or revoked.  Performed at [facility], [place] [address] [place], [place] [address]       Radiological Exams on Admission: CT Head Wo Contrast  Result Date: 04/05/2021 CLINICAL DATA:  Altered mental status EXAM: CT HEAD WITHOUT CONTRAST TECHNIQUE: Contiguous axial images were obtained from the base of the skull through the vertex without intravenous contrast. COMPARISON:  06/15/2018 FINDINGS: Brain: There are no signs of bleeding within the cranium. Ventricles are not dilated. Cortical sulci are prominent. There is decreased density in the subcortical and periventricular white matter. Vascular: Scattered arterial calcifications are seen. Skull: Unremarkable. Sinuses/Orbits: Unremarkable. Other: There is increased amount of CSF insula suggesting partial empty sella. IMPRESSION: No acute intracranial findings are seen in noncontrast CT brain. Atrophy. Small-vessel disease. Electronically Signed   By: Elmer Picker M.D.   On: 04/05/2021 14:56   DG Chest Port 1 View  Result Date: 04/05/2021 CLINICAL DATA:  Sepsis.  Altered mental status. EXAM: PORTABLE CHEST 1 VIEW COMPARISON:  07/14/2018 FINDINGS: Prior CABG. Atherosclerotic calcification of the aortic arch. Stable double contour of the left hemidiaphragm attributable to scarring and/or calcification along the diaphragm. The lung apices are partially obscured by the patient's facial tissues. The lungs appear otherwise clear. Upper normal heart size. Degenerative glenohumeral arthropathy bilaterally. IMPRESSION: 1. Stable scarring and/or calcification along the left hemidiaphragm. 2. Aortic Atherosclerosis (ICD10-I70.0). Borderline enlargement of the cardiopericardial silhouette. Electronically Signed   By: Van Clines M.D.   On: 04/05/2021 12:57    EKG: Sinus tach with no acute findings Assessment/Plan Principal Problem:   COVID    #1 sepsis present on admission unclear etiology.  On admission patient was  tachypneic and tachycardic with leukocytosis and fever of 101.2. Patient has received vancomycin and cefepime in the ED.   We will continue the same and follow-up blood culture and urine culture.  Preliminary UA shows only leukocyte Estrace.  Chest x-ray is clear. Trend lactate check procalcitonin  #2 incidental COVID chest x-ray clear patient complains of cough.  Patient on room air with normal saturation.  We will start her on Paxlovid. Will get inflammatory markers.  #3 history of type 2 diabetes check hemoglobin A1c and start sliding scale insulin  #4 dehydration with elevated creatinine we will treat her with normal saline hold ACE inhibitor's metformin and Lasix.  #5 history of hypertension I will restart carvedilol.  Hold Norvasc tonight along with Imdur restart in a.m. if blood pressure still remains high.  #6 hyperlipidemia on Zocor  #7 hypothyroidism on Synthroid  #8 dvt on xarelto  #9 hypercalcemia likely from dehydration decreased p.o. intake continue IV fluids and recheck labs in AM.    Estimated body mass index is 24.59 kg/m as calculated from the following:   Height as of 01/23/21: 5\' 7"  (1.702 m).   Weight as of this encounter: 71.2 kg.   DVT prophylaxis: Xarelto Code Status: Full code Family Communication: Discussed with daughter at bedside Disposition Plan: Home when ready Consults called: None Admission status: Inpatient   Georgette Shell MD  04/05/2021, 5:15 PM

## 2021-04-05 NOTE — ED Provider Notes (Signed)
[facility] DEPT Provider Note   CSN: [id number] Arrival date & time: 04/05/21  1045     History Chief Complaint  Patient presents with   Weakness   Altered Mental Status    Sheila Morgan is a 87 y.o. female presents to the ED for evaluation with daughter for AMS and generalized weakness.  Per the patient's daughter, brother was with her yesterday and reported normal mental status.  The patient lives at home alone and has cameras in the house.  The patient's daughter checked the cameras and saw the mother sitting on the couch with her head down.  The daughter and son came in and reported that she was hard to ambulate and relax was having weakness in her legs.  Denies any witnessed falls.  Patient is on Xarelto.  History is limited because the patient is only alert and oriented x2.  At this time, the patient reports she does not have any symptoms and denies any pain.  Level 5 caveat due to patient's AMS.   Weakness Altered Mental Status     Past Medical History:  Diagnosis Date   Arthritis    Cancer ([place])    skin on nose .  mylenoma   Coronary artery disease    status post post LAD angioplasty in 1993  and subsequent coronary artery bypass grafting x1 with a LIMA to the LAD July of 1994. She had stenting of her RCA with a bare-metal stent November 2002.   Diabetes mellitus    2   Gout    Heart murmur    Hematoma    Right groin   Hemorrhoids    Hiatal hernia    History of blood transfusion    HOH (hard of hearing)    Hyperlipidemia    Hypertension    Left knee DJD    Lightheadedness    has seen Dr Gwenlyn Found and ENT.. cant find out why   Myocardial infarction [facility])    Osteoporosis    PONV (postoperative nausea and vomiting)    Shingles 10/27/2013    Patient Active Problem List   Diagnosis Date Noted   COVID 04/05/2021   Abrasion of right hand 01/13/2021   Paroxysmal atrial fibrillation ([place]) 12/31/2020   Foot swelling 09/10/2020    Calcium pyrophosphate arthropathy of multiple sites 05/14/2020   Small vessel disease ([place]) 02/12/2020   Left leg DVT ([place]), 12/15/19 12/15/2019   Urinary frequency 02/22/2019   Fall 07/14/2018   Bilateral hearing loss 01/05/2018   Memory difficulties 01/05/2018   Multiple closed fractures of ribs of right side 01/19/2017   Acute pain of right shoulder 01/05/2017   Acute pain of right knee 01/05/2017   B12 deficiency 06/13/2015   Hypothyroidism 06/13/2015   DM (diabetes mellitus) type II, controlled, with peripheral vascular disorder ([place]) 03/06/2014   Gout 10/15/2013   Arthritis    Hiatal hernia    Coronary artery disease    Left knee DJD    Benign paroxysmal positional vertigo 05/19/2013   Weakness generalized 05/09/2013   Pseudogout of left knee 03/29/2013   Acute right lumbar radiculopathy 03/18/2013   Shoulder impingement syndrome 01/25/2013   Vertigo 10/17/2012   Renal insufficiency, mild 06/20/2011   Essential hypertension 12/31/2009   Osteoporosis - Dr Kathlene November 12/31/2009   SKIN CANCER, HX OF 12/31/2009   Proteinuria 01/05/2008   Anemia 12/21/2007   Hyperlipidemia 09/08/2006   Polymyalgia rheumatica ([place]) 09/08/2006   CORONARY ARTERY BYPASS GRAFT, HX OF 09/08/2006  Past Surgical History:  Procedure Laterality Date   CORONARY ANGIOPLASTY  1993   LAD   CORONARY ANGIOPLASTY WITH STENT PLACEMENT  03/09/01   RCA   CORONARY ARTERY BYPASS GRAFT  1994   JOINT REPLACEMENT Right 1998   hip   NM MYOVIEW LTD  03/2012   Normal   SKIN GRAFT     to nose skin cancer   TOTAL HIP ARTHROPLASTY     TOTAL KNEE ARTHROPLASTY Left 10/09/2013   Procedure: TOTAL KNEE ARTHROPLASTY;  Surgeon: Lorn Junes, MD;  Location: [facility];  Service: Orthopedics;  Laterality: Left;     OB History   No obstetric history on file.     Family History  Problem Relation Age of Onset   Heart disease Father    Cancer Sister        breast   Diabetes Sister    Heart disease Brother    Heart  disease Brother    Heart disease Brother    Diabetes Sister    Stroke Mother     Social History   Tobacco Use   Smoking status: Never   Smokeless tobacco: Never  Vaping Use   Vaping Use: Never used  Substance Use Topics   Alcohol use: No   Drug use: No    Home Medications Prior to Admission medications   Medication Sig Start Date End Date Taking? Authorizing Provider  acetaminophen (TYLENOL) 500 MG tablet Take 500 mg by mouth every 6 (six) hours as needed for moderate pain.   Yes [provider]  alendronate (FOSAMAX) 70 MG tablet Take 70 mg by mouth every Saturday. Take with a full glass of water on an empty stomach.   Yes [provider]  amLODipine (NORVASC) 5 MG tablet TAKE 1 AND 1/2 TABLETS(7.5 MG) BY MOUTH DAILY 09/04/20  Yes Burns, Claudina Lick, MD  Calcium Carbonate-Vitamin D (CALCIUM 600-D PO) Take 1 tablet by mouth 2 (two) times daily.   Yes [provider]  carvedilol (COREG) 25 MG tablet TAKE 1 TABLET BY MOUTH TWICE DAILY WITH A MEAL 11/25/20  Yes Burns, Claudina Lick, MD  colchicine 0.6 MG tablet Take 1 tablet by mouth every other day.   Yes [provider]  furosemide (LASIX) 20 MG tablet TAKE 1 TABLET(20 MG) BY MOUTH DAILY 03/04/21  Yes Burns, Claudina Lick, MD  isosorbide mononitrate (IMDUR) 30 MG 24 hr tablet TAKE 1 TABLET(30 MG) BY MOUTH DAILY 02/17/21  Yes Burns, Claudina Lick, MD  levothyroxine (SYNTHROID) 50 MCG tablet TAKE 1 TABLET(50 MCG) BY MOUTH DAILY 09/05/20  Yes Burns, Claudina Lick, MD  metFORMIN (GLUCOPHAGE) 500 MG tablet TAKE 1/2 TABLET(250 MG) BY MOUTH DAILY WITH LARGEST MEAL 09/04/20  Yes Burns, Claudina Lick, MD  MULTIPLE VITAMIN PO Take 1 tablet by mouth daily.   Yes [provider]  olmesartan (BENICAR) 40 MG tablet Take 1 tablet (40 mg total) by mouth daily. 03/06/21  Yes Lorretta Harp, MD  predniSONE (DELTASONE) 5 MG tablet Take 5 mg by mouth daily with breakfast.   Yes [provider]  rivaroxaban (XARELTO) 20 MG TABS tablet Take  1 tablet (20 mg total) by mouth daily with supper. 12/10/20  Yes Caron Presume K, PA-C  simvastatin (ZOCOR) 40 MG tablet TAKE 1 TABLET BY MOUTH IN THE EVENING Patient taking differently: Take 20 mg by mouth every evening. TAKE 1 TABLET BY MOUTH IN THE EVENING 01/14/21  Yes Lorretta Harp, MD  vitamin B-12 (CYANOCOBALAMIN) 1000 MCG tablet  Take 1,000 mcg by mouth daily.   Yes [provider]  acetaminophen (TYLENOL) 325 MG tablet Take 2 tablets (650 mg total) by mouth every 6 (six) hours as needed for mild pain, fever or headache. Patient not taking: Reported on 04/05/2021 07/19/18   Rai, Ripudeep K, MD  glucose blood test strip OneTouch Ultra Test strips  USE TO TEST BLOOD SUGAR TWICE DAILY    [provider]  Lancets (ONETOUCH DELICA PLUS [id number]) [place] USE AS DIRECTED TO TEST BLOOD SUGAR TWICE DAILY 11/04/20   Binnie Rail, MD  [facility] ULTRA test strip USE TO TEST BLOOD SUGAR TWICE DAILY 05/09/20   Binnie Rail, MD    Allergies    Patient has no known allergies.  Review of Systems   Review of Systems  Unable to perform ROS: Mental status change   Physical Exam Updated Vital Signs BP (!) 160/75   Pulse (!) 107   Temp (!) 101.6 F (38.7 C) (Oral)   Resp 20   Wt 71.2 kg   SpO2 98%   BMI 24.59 kg/m   Physical Exam Vitals and nursing note reviewed.  Constitutional:      General: She is not in acute distress.    Appearance: Normal appearance. She is not toxic-appearing.  HENT:     Head: Normocephalic and atraumatic.     Right Ear: Tympanic membrane, ear canal and external ear normal.     Left Ear: Tympanic membrane, ear canal and external ear normal.     Nose: Nose normal.     Mouth/Throat:     Mouth: Mucous membranes are moist.     Pharynx: No oropharyngeal exudate or posterior oropharyngeal erythema.     Comments: Moist mucous membranes. Uvula midline. Airway patent.  Eyes:     General: No scleral icterus. Cardiovascular:     Rate and Rhythm: Regular  rhythm. Tachycardia present.  Pulmonary:     Effort: Pulmonary effort is normal. No respiratory distress.     Breath sounds: Normal breath sounds. No wheezing.     Comments: CTAB. No respiratory distress, sensory muscle use, tripoding, nasal flaring, or cyanosis. Abdominal:     General: Abdomen is flat. Bowel sounds are normal.     Palpations: Abdomen is soft.     Tenderness: There is no abdominal tenderness. There is no guarding or rebound.  Musculoskeletal:        General: No deformity.     Cervical back: Normal range of motion and neck supple. No tenderness.     Right lower leg: No edema.     Left lower leg: No edema.  Skin:    General: Skin is warm and dry.  Neurological:     General: No focal deficit present.     Mental Status: She is alert.     Sensory: No sensory deficit.     Motor: No weakness.     Comments: Patient is alert and oriented x2.  Daughter at bedside reports is not at patient's baseline.  Patient does not have any focal deficits.  She has equal grip strength.  Patient is able to follow commands with reminders.    ED Results / Procedures / Treatments   Labs (all labs ordered are listed, but only abnormal results are displayed) Labs Reviewed  RESP PANEL BY RT-PCR (FLU A&B, COVID) ARPGX2 - Abnormal; Notable for the following components:      Result Value   SARS Coronavirus 2 by RT PCR POSITIVE (*)  All other components within normal limits  COMPREHENSIVE METABOLIC PANEL - Abnormal; Notable for the following components:   Glucose, Bld 167 (*)    Creatinine, Ser 1.13 (*)    Calcium 10.7 (*)    GFR, Estimated 47 (*)    All other components within normal limits  CBC WITH DIFFERENTIAL/PLATELET - Abnormal; Notable for the following components:   WBC 13.2 (*)    Neutro Abs 11.5 (*)    Lymphs Abs 0.5 (*)    Monocytes Absolute 1.1 (*)    All other components within normal limits  PROTIME-INR - Abnormal; Notable for the following components:   Prothrombin Time  22.6 (*)    INR 2.0 (*)    All other components within normal limits  URINALYSIS, ROUTINE W REFLEX MICROSCOPIC - Abnormal; Notable for the following components:   Protein, ur >=300 (*)    All other components within normal limits  CULTURE, BLOOD (ROUTINE X 2)  CULTURE, BLOOD (ROUTINE X 2)  URINE CULTURE  LACTIC ACID, PLASMA  LACTIC ACID, PLASMA  APTT  PROCALCITONIN  LACTIC ACID, PLASMA  CBC  COMPREHENSIVE METABOLIC PANEL  C-REACTIVE PROTEIN  LACTATE DEHYDROGENASE  D-DIMER, QUANTITATIVE  PROCALCITONIN  LACTIC ACID, PLASMA  HEMOGLOBIN A1C  TSH  PROTIME-INR    EKG EKG Interpretation  Date/Time:  Saturday April 05 2021 12:20:36 EST Ventricular Rate:  95 PR Interval:    QRS Duration: 88 QT Interval:  350 QTC Calculation: 440 R Axis:   46 Text Interpretation: Atrial flutter with predominant 3:1 AV block Ventricular premature complex Confirmed by Fredia Sorrow [phone]) on 04/05/2021 12:37:33 PM  Radiology CT Head Wo Contrast  Result Date: 04/05/2021 CLINICAL DATA:  Altered mental status EXAM: CT HEAD WITHOUT CONTRAST TECHNIQUE: Contiguous axial images were obtained from the base of the skull through the vertex without intravenous contrast. COMPARISON:  06/15/2018 FINDINGS: Brain: There are no signs of bleeding within the cranium. Ventricles are not dilated. Cortical sulci are prominent. There is decreased density in the subcortical and periventricular white matter. Vascular: Scattered arterial calcifications are seen. Skull: Unremarkable. Sinuses/Orbits: Unremarkable. Other: There is increased amount of CSF insula suggesting partial empty sella. IMPRESSION: No acute intracranial findings are seen in noncontrast CT brain. Atrophy. Small-vessel disease. Electronically Signed   By: Elmer Picker M.D.   On: 04/05/2021 14:56   DG Chest Port 1 View  Result Date: 04/05/2021 CLINICAL DATA:  Sepsis.  Altered mental status. EXAM: PORTABLE CHEST 1 VIEW COMPARISON:  07/14/2018  FINDINGS: Prior CABG. Atherosclerotic calcification of the aortic arch. Stable double contour of the left hemidiaphragm attributable to scarring and/or calcification along the diaphragm. The lung apices are partially obscured by the patient's facial tissues. The lungs appear otherwise clear. Upper normal heart size. Degenerative glenohumeral arthropathy bilaterally. IMPRESSION: 1. Stable scarring and/or calcification along the left hemidiaphragm. 2. Aortic Atherosclerosis (ICD10-I70.0). Borderline enlargement of the cardiopericardial silhouette. Electronically Signed   By: Van Clines M.D.   On: 04/05/2021 12:57    Procedures .Critical Care Performed by: Sherrell Puller, PA-C Authorized by: Sherrell Puller, PA-C   Critical care provider statement:    Critical care time (minutes):  35   Critical care was necessary to treat or prevent imminent or life-threatening deterioration of the following conditions:  Sepsis   Critical care was time spent personally by me on the following activities:  Development of treatment plan with patient or surrogate, discussions with consultants, evaluation of patient's response to treatment, examination of patient, ordering and review of laboratory studies,  ordering and review of radiographic studies, ordering and performing treatments and interventions, pulse oximetry, re-evaluation of patient's condition, review of old charts and obtaining history from patient or surrogate   Care discussed with: admitting provider     Medications Ordered in ED Medications  lactated ringers infusion ( Intravenous New Bag/Given 04/05/21 1553)  carvedilol (COREG) tablet 25 mg (has no administration in time range)  levothyroxine (SYNTHROID) tablet 50 mcg (has no administration in time range)  rivaroxaban (XARELTO) tablet 20 mg (has no administration in time range)  0.9 %  sodium chloride infusion (has no administration in time range)  insulin aspart (novoLOG) injection 0-6 Units (has  no administration in time range)  acetaminophen (TYLENOL) suppository 650 mg (650 mg Rectal Given 04/05/21 1810)  molnupiravir EUA (LAGEVRIO) capsule 800 mg (has no administration in time range)  acetaminophen (TYLENOL) suppository 650 mg (650 mg Rectal Given 04/05/21 1437)  metroNIDAZOLE (FLAGYL) IVPB 500 mg (500 mg Intravenous New Bag/Given 04/05/21 1644)  vancomycin (VANCOREADY) IVPB 1250 mg/250 mL (1,250 mg Intravenous New Bag/Given 04/05/21 1649)  ceFEPIme (MAXIPIME) 2 g in sodium chloride 0.9 % 100 mL IVPB (2 g Intravenous New Bag/Given 04/05/21 1555)    ED Course  I have reviewed the triage vital signs and the nursing notes.  Pertinent labs & imaging results that were available during my care of the patient were reviewed by me and considered in my medical decision making (see chart for details).  87 year old female presents emergency department for evaluation of altered mental status.  Patient presented febrile, mildly hypertensive, and borderline tachycardic.  She was satting well on room air.  No respiratory distress noted.  Was alert and oriented x2.  She had no complaints, denied any and all pain.  On physical exam, her lungs are clear to auscultation bilaterally.  She has a soft abdomen.  Denies any back tenderness to palpation.  Regular rate and rhythm.  No leg drift.  Equal grip strength.  Given the altered mental status, head CT ordered as well as labs and other imaging.  Suspicion for sepsis so work-up was started.   Personally reviewed and interpreted the patient's labs and imaging.  Patient COVID-positive, flu negative.  CMP shows elevated glucose at 167 with an elevated creatinine at 1.13 is baseline to patient's previous 3 months ago.  No electrolyte abnormality.  CBC shows mildly elevated white count at 13.2 with a left shift.  No anemia.  Elevated PT/INR.  Normal APTT.  Urine came back negative although greater than 300 protein.  Initial lactic came back at 1.8 and down trended to  1.6.  On CT head, mild atrophy seen, but no acute intracranial abnormality.  Chest x-ray shows stable scarring along the left hemidiaphragm along with borderline cardio pericardial silhouette. EKG shows atrial flutter.  Attending physician assessed at bedside.  Broad-spectrum antibiotics were started. Bolus fluids were not given as the patient had hypertension, borderline tachycardic, and had a wBC <14.  Call to admission placed.  Dr. Zigmund Daniel to admit.  I discussed this case with my attending physician who cosigned this note including patient's presenting symptoms, physical exam, and planned diagnostics and interventions. Attending physician stated agreement with plan or made changes to plan which were implemented.   Attending physician assessed patient at bedside.     MDM Rules/Calculators/A&P                          Final Clinical Impression(s) / ED Diagnoses Final  diagnoses:  COVID  Transient alteration of awareness  Sepsis, due to unspecified organism, unspecified whether acute organ dysfunction present [facility])    Rx / DC Orders ED Discharge Orders     None        Sherrell Puller, PA-C 04/05/21 1916    Fredia Sorrow, MD 04/10/21 [phone]

## 2021-04-05 NOTE — Progress Notes (Signed)
Sepsis tracking by eLINK 

## 2021-04-05 NOTE — ED Provider Notes (Signed)
  I provided a substantive portion of the care of this patient.  I personally performed the entirety of the history, exam, and medical decision making for this encounter.  CRITICAL CARE Performed by: Fredia Sorrow Total critical care time: 35 minutes Critical care time was exclusive of separately billable procedures and treating other patients. Critical care was necessary to treat or prevent imminent or life-threatening deterioration. Critical care was time spent personally by me on the following activities: development of treatment plan with patient and/or surrogate as well as nursing, discussions with consultants, evaluation of patient's response to treatment, examination of patient, obtaining history from patient or surrogate, ordering and performing treatments and interventions, ordering and review of laboratory studies, ordering and review of radiographic studies, pulse oximetry and re-evaluation of patient's condition.  EKG Interpretation  Date/Time:  Saturday April 05 2021 12:20:36 EST Ventricular Rate:  95 PR Interval:    QRS Duration: 88 QT Interval:  350 QTC Calculation: 440 R Axis:   46 Text Interpretation: Atrial flutter with predominant 3:1 AV block Ventricular premature complex Confirmed by Fredia Sorrow [phone]) on 04/05/2021 12:37:33 PM  Patient brought in by EMS family remotely monitors her.  The shoulder struggling to get up out of the chair came to see her noted that she was confused.  EMS noted strong smell of urine.  Patient reports increased urinary frequency.  Temp here 101.2.  Respirations up a little bit at 21 blood pressure is good at 179/76 oxygen sats 98%.  Heart rate 92.  Patient will be given Tylenol for the fever.  Some concerns for possible sepsis but lactic acid was 1.8.  White blood cell count was 13.2 to less than 14.  High probability patient may have urinary tract infection.  But will need admission due to some of the altered mental status.  She is got  a get a chest x-ray and CT head.  Chest x-ray shows stable scarring and/or calcification along the left hemidiaphragm.  Will be started on antibiotics once we take a look at the urine.  Meeting septic criteria based on vital signs.  Other than may be fever to 101.2 and heart rate in the 90s.  We will go ahead and initiate sepsis protocol.   Fredia Sorrow, MD 04/05/21 [phone]

## 2021-04-05 NOTE — Progress Notes (Signed)
A consult was received from an ED physician for Cefepime and Vancomycin per pharmacy dosing.  The patient's profile has been reviewed for ht/wt/allergies/indication/available labs.   A one time order has been placed for Cefepime 2g IV and Vancomycin 1250 mg IV.  Further antibiotics/pharmacy consults should be ordered by admitting physician if indicated.                       Thank you,  Gretta Arab PharmD, BCPS Clinical Pharmacist [facility] main pharmacy [phone] 04/05/2021 3:09 PM

## 2021-04-05 NOTE — ED Triage Notes (Signed)
BIB EMS from home, family remotely monitors the pt, saw her struggling to get up out of the chair, came to see her and noted AMS. Started yesterday evening. EMS reports strong urinary odor, pt reports increased urinary frequency.   BP 170/80 HR 90, NSR O2 97% CBG 215

## 2021-04-06 ENCOUNTER — Inpatient Hospital Stay (HOSPITAL_COMMUNITY): Payer: Medicare Other

## 2021-04-06 DIAGNOSIS — U071 COVID-19: Secondary | ICD-10-CM | POA: Diagnosis not present

## 2021-04-06 LAB — CBC
HCT: 35.4 % — ABNORMAL LOW (ref 36.0–46.0)
Hemoglobin: 11.6 g/dL — ABNORMAL LOW (ref 12.0–15.0)
MCH: 30.1 pg (ref 26.0–34.0)
MCHC: 32.8 g/dL (ref 30.0–36.0)
MCV: 91.9 fL (ref 80.0–100.0)
Platelets: 133 10*3/uL — ABNORMAL LOW (ref 150–400)
RBC: 3.85 MIL/uL — ABNORMAL LOW (ref 3.87–5.11)
RDW: 13.7 % (ref 11.5–15.5)
WBC: 8.7 10*3/uL (ref 4.0–10.5)
nRBC: 0 % (ref 0.0–0.2)

## 2021-04-06 LAB — GLUCOSE, CAPILLARY
Glucose-Capillary: 106 mg/dL — ABNORMAL HIGH (ref 70–99)
Glucose-Capillary: 106 mg/dL — ABNORMAL HIGH (ref 70–99)
Glucose-Capillary: 132 mg/dL — ABNORMAL HIGH (ref 70–99)
Glucose-Capillary: 240 mg/dL — ABNORMAL HIGH (ref 70–99)

## 2021-04-06 LAB — LACTIC ACID, PLASMA
Lactic Acid, Venous: 1.1 mmol/L (ref 0.5–1.9)
Lactic Acid, Venous: 1.9 mmol/L (ref 0.5–1.9)

## 2021-04-06 LAB — COMPREHENSIVE METABOLIC PANEL
ALT: 17 U/L (ref 0–44)
AST: 34 U/L (ref 15–41)
Albumin: 3.1 g/dL — ABNORMAL LOW (ref 3.5–5.0)
Alkaline Phosphatase: 57 U/L (ref 38–126)
Anion gap: 6 (ref 5–15)
BUN: 11 mg/dL (ref 8–23)
CO2: 25 mmol/L (ref 22–32)
Calcium: 9 mg/dL (ref 8.9–10.3)
Chloride: 104 mmol/L (ref 98–111)
Creatinine, Ser: 1.04 mg/dL — ABNORMAL HIGH (ref 0.44–1.00)
GFR, Estimated: 52 mL/min — ABNORMAL LOW (ref >60)
Glucose, Bld: 119 mg/dL — ABNORMAL HIGH (ref 70–99)
Potassium: 3.1 mmol/L — ABNORMAL LOW (ref 3.5–5.1)
Sodium: 135 mmol/L (ref 135–145)
Total Bilirubin: 1 mg/dL (ref 0.3–1.2)
Total Protein: 5.9 g/dL — ABNORMAL LOW (ref 6.5–8.1)

## 2021-04-06 LAB — BLOOD GAS, VENOUS
Acid-Base Excess: 0.9 mmol/L (ref 0.0–2.0)
Bicarbonate: 25.3 mmol/L (ref 20.0–28.0)
O2 Saturation: 46.4 %
Patient temperature: 98.6
pCO2, Ven: 42.1 mmHg — ABNORMAL LOW (ref 44.0–60.0)
pH, Ven: 7.397 (ref 7.250–7.430)
pO2, Ven: 27.2 mmHg — CL (ref 32.0–45.0)

## 2021-04-06 LAB — MRSA NEXT GEN BY PCR, NASAL: MRSA by PCR Next Gen: NOT DETECTED

## 2021-04-06 LAB — C-REACTIVE PROTEIN: CRP: 17.8 mg/dL — ABNORMAL HIGH (ref <1.0)

## 2021-04-06 LAB — PROTIME-INR
INR: 3.1 — ABNORMAL HIGH (ref 0.8–1.2)
Prothrombin Time: 31.7 seconds — ABNORMAL HIGH (ref 11.4–15.2)

## 2021-04-06 LAB — D-DIMER, QUANTITATIVE: D-Dimer, Quant: 0.46 ug/mL-FEU (ref 0.00–0.50)

## 2021-04-06 LAB — PROCALCITONIN: Procalcitonin: 0.87 ng/mL

## 2021-04-06 LAB — TSH: TSH: 1.263 u[IU]/mL (ref 0.350–4.500)

## 2021-04-06 LAB — LACTATE DEHYDROGENASE: LDH: 176 U/L (ref 98–192)

## 2021-04-06 MED ORDER — SODIUM CHLORIDE 0.9 % IV SOLN
500.0000 mg | Freq: Every day | INTRAVENOUS | Status: DC
  Administered 2021-04-06 – 2021-04-07 (×2): 500 mg via INTRAVENOUS
  Filled 2021-04-06 (×3): qty 500

## 2021-04-06 MED ORDER — DEXAMETHASONE SODIUM PHOSPHATE 4 MG/ML IJ SOLN
4.0000 mg | Freq: Two times a day (BID) | INTRAMUSCULAR | Status: DC
Start: 2021-04-06 — End: 2021-04-08
  Administered 2021-04-06 – 2021-04-08 (×5): 4 mg via INTRAVENOUS
  Filled 2021-04-06 (×5): qty 1

## 2021-04-06 MED ORDER — POTASSIUM CHLORIDE CRYS ER 20 MEQ PO TBCR
40.0000 meq | EXTENDED_RELEASE_TABLET | Freq: Once | ORAL | Status: AC
  Administered 2021-04-06: 14:00:00 40 meq via ORAL
  Filled 2021-04-06: qty 2

## 2021-04-06 MED ORDER — SODIUM CHLORIDE 0.9 % IV SOLN
1.0000 g | Freq: Every day | INTRAVENOUS | Status: AC
  Administered 2021-04-06 – 2021-04-10 (×5): 1 g via INTRAVENOUS
  Filled 2021-04-06 (×6): qty 10

## 2021-04-06 MED ORDER — ALBUTEROL SULFATE (2.5 MG/3ML) 0.083% IN NEBU: 2.5000 mg | INHALATION_SOLUTION | RESPIRATORY_TRACT | Status: DC | PRN

## 2021-04-06 NOTE — Progress Notes (Signed)
Date and time results received: 04/06/21 1500 (use smartphrase ".now" to insert current time)  Test: venous blood gas pO2 Critical Value: 27.2  Name of Provider Notified: Dr Rodena Piety  Pt placed on 2lnc. Spoke w/ Dr Rodena Piety directly to confirm lab result.

## 2021-04-06 NOTE — Progress Notes (Signed)
PROGRESS NOTE    Sheila Morgan  DDU:[id number] DOB: 02/09/1933 DOA: 04/05/2021 PCP: Binnie Rail, MD   Brief Narrative:Sheila Morgan is a 87 y.o. female with medical history significant of type 2 diabetes, hypothyroidism, hypertension, CAD, CABG, prior history of COVID has taken COVID vaccinations and monoclonal antibodies family called EMS as patient was acting different.  Family watches her through the camera and noticed that she was acting different and confused.  When they went to check on her they found her very weak and confused with fever. Complains of cough, denies shortness of breath nausea vomiting or diarrhea. Family thought she had foul-smelling urine and increased urinary frequency.  Family noticed that patient was struggling to get out of the chair. Her p.o. intake has been poor with decreased appetite. History is obtained from the patient's daughter as patient is confused and unable to provide any relevant information.    Assessment & Plan:   Principal Problem:   COVID   #1 sepsis present on admission unclear etiology.  On admission patient was tachypneic and tachycardic with leukocytosis and fever of 101.2. Patient has received vancomycin and cefepime in the ED.   Will started on Rocephin and azithromycin.   Preliminary UA does not appear to be UTI.  Follow-up blood cultures.   Initial chest x-ray was clear however she is wheezing coughing and more sleepier than usual.  Repeat chest x-ray.   Lactic acid elevated trend.  #2 COVID without hypoxia- chest x-ray clear  patient complains of cough.   Patient on room air with normal saturation.  We will start her on Paxlovid.  Check VBG Chest x-ray Nebulizer treatments Start Decadron  #3 history of type 2 diabetes check hemoglobin A1c and start sliding scale insulin   #4 dehydration with elevated creatinine we will treat her with normal saline hold ACE inhibitor's metformin and Lasix.   #5 history of hypertension I  will restart carvedilol.  Was on Norvasc and Imdur at home restart as needed.    #6 hyperlipidemia on Zocor   #7 hypothyroidism on Synthroid   #8 dvt on xarelto   #9 hypercalcemia resolved with IV hydration.    Estimated body mass index is 26.07 kg/m as calculated from the following:   Height as of this encounter: 5\' 4"  (1.626 m).   Weight as of this encounter: 68.9 kg.  DVT prophylaxis: Xarelto Code Status: Full code Family Communication: Daughter Disposition Plan:  Status is: Inpatient  Remains inpatient appropriate because: IV antibiotics    Consultants: None  Procedures: None Antimicrobials: Rocephin azithromycin and PaxLOVID  Subjective Patient is resting in bed but more sleepier than yesterday More wheezing On room air 98% Daughter concerned Has not received any narcotics overnight Objective: Vitals:   04/05/21 2026 04/06/21 0126 04/06/21 0425 04/06/21 0922  BP:  (!) 109/59 (!) 155/126 (!) 146/71  Pulse:  92 85 90  Resp:  20 20 20   Temp:  99.4 F (37.4 C) 99 F (37.2 C) 98.7 F (37.1 C)  TempSrc:  Oral Oral Oral  SpO2:  99% 95% 98%  Weight: 68.9 kg     Height: 5\' 4"  (1.626 m)       Intake/Output Summary (Last 24 hours) at 04/06/2021 1316 Last data filed at 04/06/2021 0423 Gross per 24 hour  Intake --  Output 501 ml  Net -501 ml   Filed Weights   04/05/21 1531 04/05/21 2026  Weight: 71.2 kg 68.9 kg    Examination:  General exam:  Appears frail and elderly ill looking  respiratory system: Clear to auscultation. Respiratory effort normal. Cardiovascular system: S1 & S2 heard, RRR. No JVD, murmurs, rubs, gallops or clicks.  Gastrointestinal system: Abdomen is nondistended, soft and nontender. No organomegaly or masses felt. Normal bowel sounds heard. Central nervous system: Confused moves all extremities extremities: Trace bilateral pitting edema. Skin: No rashes, lesions or ulcers Psychiatry: Unable to assess   Data Reviewed: I have personally  reviewed following labs and imaging studies  CBC: Recent Labs  Lab 04/05/21 1303 04/06/21 0528  WBC 13.2* 8.7  NEUTROABS 11.5*  --   HGB 12.9 11.6*  HCT 38.8 35.4*  MCV 92.2 91.9  PLT 164 237*   Basic Metabolic Panel: Recent Labs  Lab 04/05/21 1303 04/06/21 0528  NA 137 135  K 3.6 3.1*  CL 99 104  CO2 27 25  GLUCOSE 167* 119*  BUN 11 11  CREATININE 1.13* 1.04*  CALCIUM 10.7* 9.0   GFR: Estimated Creatinine Clearance: 37 mL/min (A) (by C-G formula based on SCr of 1.04 mg/dL (H)). Liver Function Tests: Recent Labs  Lab 04/05/21 1303 04/06/21 0528  AST 26 34  ALT 18 17  ALKPHOS 71 57  BILITOT 1.0 1.0  PROT 7.3 5.9*  ALBUMIN 4.0 3.1*   No results for input(s): LIPASE, AMYLASE in the last 168 hours. No results for input(s): AMMONIA in the last 168 hours. Coagulation Profile: Recent Labs  Lab 04/05/21 1303 04/06/21 0528  INR 2.0* 3.1*   Cardiac Enzymes: No results for input(s): CKTOTAL, CKMB, CKMBINDEX, TROPONINI in the last 168 hours. BNP (last 3 results) No results for input(s): PROBNP in the last 8760 hours. HbA1C: Recent Labs    04/05/21 1303  HGBA1C 7.2*   CBG: Recent Labs  Lab 04/05/21 2207 04/06/21 0725 04/06/21 1203  GLUCAP 134* 106* 106*   Lipid Profile: No results for input(s): CHOL, HDL, LDLCALC, TRIG, CHOLHDL, LDLDIRECT in the last 72 hours. Thyroid Function Tests: Recent Labs    04/06/21 0528  TSH 1.263   Anemia Panel: No results for input(s): VITAMINB12, FOLATE, FERRITIN, TIBC, IRON, RETICCTPCT in the last 72 hours. Sepsis Labs: Recent Labs  Lab 04/05/21 1303 04/05/21 1602 04/05/21 1733 04/05/21 1828 04/05/21 2140 04/06/21 0528  PROCALCITON  --   --  0.22  --   --  <0.10  LATICACIDVEN 1.8 1.6  --  0.7 2.9*  --     Recent Results (from the past 240 hour(s))  Blood Culture (routine x 2)     Status: None (Preliminary result)   Collection Time: 04/05/21  1:03 PM   Specimen: BLOOD  Result Value Ref Range Status    Specimen Description   Final    BLOOD BLOOD RIGHT FOREARM Performed at [facility], [place] [address], [place], [place] [address]    Special Requests   Final    BOTTLES DRAWN AEROBIC AND ANAEROBIC Blood Culture results may not be optimal due to an inadequate volume of blood received in culture bottles Performed at [facility] [address] [place], [place] [address]    Culture   Final    NO GROWTH < 24 HOURS Performed at [facility] [address] [place], [place], [place] [address]    Report Status PENDING  Incomplete  Blood Culture (routine x 2)     Status: None (Preliminary result)   Collection Time: 04/05/21  1:03 PM   Specimen: BLOOD  Result Value Ref Range Status   Specimen  Description   Final    BLOOD BLOOD LEFT WRIST Performed at [facility] [address] [place], [place] [address]    Special Requests   Final    BOTTLES DRAWN AEROBIC AND ANAEROBIC Blood Culture adequate volume Performed at [facility] [address], [place], [place] [address]    Culture   Final    NO GROWTH < 24 HOURS Performed at [facility] [address], [place], [place] [address]    Report Status PENDING  Incomplete  Resp Panel by RT-PCR (Flu A&B, Covid)     Status: Abnormal   Collection Time: 04/05/21  1:03 PM   Specimen: Nasopharyngeal(NP) swabs in vial transport medium  Result Value Ref Range Status   SARS Coronavirus 2 by RT PCR POSITIVE (A) NEGATIVE Final    Comment: RESULT CALLED TO, READ BACK BY AND VERIFIED WITH: GARRISON G. RN ON 04/05/2021 @ 1556 BY MECIAL J. (NOTE) SARS-CoV-2 target nucleic acids are DETECTED.  The SARS-CoV-2 RNA is generally detectable in upper respiratory specimens during the acute phase of infection. Positive results are indicative of the presence of the identified virus, but do not rule out bacterial infection or co-infection with other pathogens not detected by the  test. Clinical correlation with patient history and other diagnostic information is necessary to determine patient infection status. The expected result is Negative.  Fact Sheet for Patients: EntrepreneurPulse.com.au  Fact Sheet for Healthcare Providers: IncredibleEmployment.be  This test is not yet approved or cleared by the [place] FDA and  has been authorized for detection and/or diagnosis of SARS-CoV-2 by FDA under an Emergency Use Authorization (EUA).  This EUA will remain in effect (meaning t his test can be used) for the duration of  the COVID-19 declaration under Section 564(b)(1) of the Act, 21 U.S.C. section 360bbb-3(b)(1), unless the authorization is terminated or revoked sooner.     Influenza A by PCR NEGATIVE NEGATIVE Final   Influenza B by PCR NEGATIVE NEGATIVE Final    Comment: (NOTE) The Xpert Xpress SARS-CoV-2/FLU/RSV plus assay is intended as an aid in the diagnosis of influenza from Nasopharyngeal swab specimens and should not be used as a sole basis for treatment. Nasal washings and aspirates are unacceptable for Xpert Xpress SARS-CoV-2/FLU/RSV testing.  Fact Sheet for Patients: EntrepreneurPulse.com.au  Fact Sheet for Healthcare Providers: IncredibleEmployment.be  This test is not yet approved or cleared by the [place] FDA and has been authorized for detection and/or diagnosis of SARS-CoV-2 by FDA under an Emergency Use Authorization (EUA). This EUA will remain in effect (meaning this test can be used) for the duration of the COVID-19 declaration under Section 564(b)(1) of the Act, 21 U.S.C. section 360bbb-3(b)(1), unless the authorization is terminated or revoked.  Performed at [facility], [place] [address], [place],  [address]          Radiology Studies: CT Head Wo Contrast  Result Date: 04/05/2021 CLINICAL DATA:  Altered mental status  EXAM: CT HEAD WITHOUT CONTRAST TECHNIQUE: Contiguous axial images were obtained from the base of the skull through the vertex without intravenous contrast. COMPARISON:  06/15/2018 FINDINGS: Brain: There are no signs of bleeding within the cranium. Ventricles are not dilated. Cortical sulci are prominent. There is decreased density in the subcortical and periventricular white matter. Vascular: Scattered arterial calcifications are seen. Skull: Unremarkable. Sinuses/Orbits: Unremarkable. Other: There is increased amount of CSF insula suggesting partial empty sella. IMPRESSION: No acute intracranial findings are seen in noncontrast  CT brain. Atrophy. Small-vessel disease. Electronically Signed   By: Elmer Picker M.D.   On: 04/05/2021 14:56   DG Chest Port 1 View  Result Date: 04/05/2021 CLINICAL DATA:  Sepsis.  Altered mental status. EXAM: PORTABLE CHEST 1 VIEW COMPARISON:  07/14/2018 FINDINGS: Prior CABG. Atherosclerotic calcification of the aortic arch. Stable double contour of the left hemidiaphragm attributable to scarring and/or calcification along the diaphragm. The lung apices are partially obscured by the patient's facial tissues. The lungs appear otherwise clear. Upper normal heart size. Degenerative glenohumeral arthropathy bilaterally. IMPRESSION: 1. Stable scarring and/or calcification along the left hemidiaphragm. 2. Aortic Atherosclerosis (ICD10-I70.0). Borderline enlargement of the cardiopericardial silhouette. Electronically Signed   By: Van Clines M.D.   On: 04/05/2021 12:57        Scheduled Meds:  carvedilol  25 mg Oral BID WC   insulin aspart  0-6 Units Subcutaneous TID WC   levothyroxine  50 mcg Oral Q0600   molnupiravir EUA  4 capsule Oral BID   rivaroxaban  20 mg Oral Q supper   Continuous Infusions:  sodium chloride 75 mL/hr at 04/06/21 0423     LOS: 1 day   Georgette Shell, MD 04/06/2021, 1:16 PM

## 2021-04-06 NOTE — Plan of Care (Signed)
Care assumed for Sheila Morgan at 1245. Kimberley has been very drowsy but arousable all shift. Tmax of 102.3, tylenol supp given. MRSA swab sent as ordered. Refused lunch and ate only a few bites of dinner.  Problem: Education: Goal: Knowledge of risk factors and measures for prevention of condition will improve Outcome: Progressing   Problem: Coping: Goal: Psychosocial and spiritual needs will be supported Outcome: Progressing   Problem: Respiratory: Goal: Will maintain a patent airway Outcome: Progressing Goal: Complications related to the disease process, condition or treatment will be avoided or minimized Outcome: Progressing   Problem: Education: Goal: Knowledge of General Education information will improve Description: Including pain rating scale, medication(s)/side effects and non-pharmacologic comfort measures Outcome: Progressing   Problem: Health Behavior/Discharge Planning: Goal: Ability to manage health-related needs will improve Outcome: Progressing   Problem: Clinical Measurements: Goal: Ability to maintain clinical measurements within normal limits will improve Outcome: Progressing Goal: Will remain free from infection Outcome: Progressing Goal: Diagnostic test results will improve Outcome: Progressing Goal: Respiratory complications will improve Outcome: Progressing Goal: Cardiovascular complication will be avoided Outcome: Progressing   Problem: Activity: Goal: Risk for activity intolerance will decrease Outcome: Progressing   Problem: Nutrition: Goal: Adequate nutrition will be maintained Outcome: Progressing   Problem: Coping: Goal: Level of anxiety will decrease Outcome: Progressing   Problem: Elimination: Goal: Will not experience complications related to bowel motility Outcome: Progressing Goal: Will not experience complications related to urinary retention Outcome: Progressing   Problem: Pain Managment: Goal: General experience of comfort will  improve Outcome: Progressing   Problem: Safety: Goal: Ability to remain free from injury will improve Outcome: Progressing   Problem: Skin Integrity: Goal: Risk for impaired skin integrity will decrease Outcome: Progressing

## 2021-04-06 NOTE — Plan of Care (Signed)
  Problem: Respiratory: Goal: Will maintain a patent airway Outcome: Progressing Goal: Complications related to the disease process, condition or treatment will be avoided or minimized Outcome: Progressing   Problem: Clinical Measurements: Goal: Ability to maintain clinical measurements within normal limits will improve Outcome: Progressing Goal: Respiratory complications will improve Outcome: Progressing Goal: Cardiovascular complication will be avoided Outcome: Progressing   Problem: Education: Goal: Knowledge of risk factors and measures for prevention of condition will improve Outcome: Not Progressing   Problem: Coping: Goal: Psychosocial and spiritual needs will be supported Outcome: Not Progressing   Problem: Education: Goal: Knowledge of General Education information will improve Description: Including pain rating scale, medication(s)/side effects and non-pharmacologic comfort measures Outcome: Not Progressing   Problem: Health Behavior/Discharge Planning: Goal: Ability to manage health-related needs will improve Outcome: Not Progressing   Problem: Clinical Measurements: Goal: Will remain free from infection Outcome: Not Progressing Goal: Diagnostic test results will improve Outcome: Not Progressing   Problem: Activity: Goal: Risk for activity intolerance will decrease Outcome: Not Progressing

## 2021-04-07 DIAGNOSIS — U071 COVID-19: Secondary | ICD-10-CM | POA: Diagnosis not present

## 2021-04-07 LAB — CBC WITH DIFFERENTIAL/PLATELET
Abs Immature Granulocytes: 0.03 10*3/uL (ref 0.00–0.07)
Basophils Absolute: 0 10*3/uL (ref 0.0–0.1)
Basophils Relative: 0 %
Eosinophils Absolute: 0 10*3/uL (ref 0.0–0.5)
Eosinophils Relative: 0 %
HCT: 38.1 % (ref 36.0–46.0)
Hemoglobin: 12.4 g/dL (ref 12.0–15.0)
Immature Granulocytes: 0 %
Lymphocytes Relative: 10 %
Lymphs Abs: 0.6 10*3/uL — ABNORMAL LOW (ref 0.7–4.0)
MCH: 30.5 pg (ref 26.0–34.0)
MCHC: 32.5 g/dL (ref 30.0–36.0)
MCV: 93.6 fL (ref 80.0–100.0)
Monocytes Absolute: 0.2 10*3/uL (ref 0.1–1.0)
Monocytes Relative: 3 %
Neutro Abs: 5.8 10*3/uL (ref 1.7–7.7)
Neutrophils Relative %: 87 %
Platelets: 129 10*3/uL — ABNORMAL LOW (ref 150–400)
RBC: 4.07 MIL/uL (ref 3.87–5.11)
RDW: 13.7 % (ref 11.5–15.5)
WBC: 6.7 10*3/uL (ref 4.0–10.5)
nRBC: 0 % (ref 0.0–0.2)

## 2021-04-07 LAB — COMPREHENSIVE METABOLIC PANEL
ALT: 21 U/L (ref 0–44)
AST: 38 U/L (ref 15–41)
Albumin: 3 g/dL — ABNORMAL LOW (ref 3.5–5.0)
Alkaline Phosphatase: 59 U/L (ref 38–126)
Anion gap: 9 (ref 5–15)
BUN: 14 mg/dL (ref 8–23)
CO2: 23 mmol/L (ref 22–32)
Calcium: 8.7 mg/dL — ABNORMAL LOW (ref 8.9–10.3)
Chloride: 107 mmol/L (ref 98–111)
Creatinine, Ser: 1.16 mg/dL — ABNORMAL HIGH (ref 0.44–1.00)
GFR, Estimated: 46 mL/min — ABNORMAL LOW (ref >60)
Glucose, Bld: 228 mg/dL — ABNORMAL HIGH (ref 70–99)
Potassium: 4 mmol/L (ref 3.5–5.1)
Sodium: 139 mmol/L (ref 135–145)
Total Bilirubin: 0.7 mg/dL (ref 0.3–1.2)
Total Protein: 6.6 g/dL (ref 6.5–8.1)

## 2021-04-07 LAB — URINE CULTURE

## 2021-04-07 LAB — LACTIC ACID, PLASMA: Lactic Acid, Venous: 1.9 mmol/L (ref 0.5–1.9)

## 2021-04-07 LAB — GLUCOSE, CAPILLARY
Glucose-Capillary: 174 mg/dL — ABNORMAL HIGH (ref 70–99)
Glucose-Capillary: 193 mg/dL — ABNORMAL HIGH (ref 70–99)
Glucose-Capillary: 218 mg/dL — ABNORMAL HIGH (ref 70–99)
Glucose-Capillary: 189 mg/dL — ABNORMAL HIGH (ref 70–99)

## 2021-04-07 LAB — PROCALCITONIN: Procalcitonin: 0.69 ng/mL

## 2021-04-07 LAB — AMMONIA: Ammonia: 41 umol/L — ABNORMAL HIGH (ref 9–35)

## 2021-04-07 MED ORDER — RIVAROXABAN 15 MG PO TABS
15.0000 mg | ORAL_TABLET | Freq: Every day | ORAL | Status: DC
  Administered 2021-04-07 – 2021-04-16 (×10): 15 mg via ORAL
  Filled 2021-04-07 (×10): qty 1

## 2021-04-07 MED ORDER — ORAL CARE MOUTH RINSE
15.0000 mL | Freq: Two times a day (BID) | OROMUCOSAL | Status: DC
  Administered 2021-04-07 – 2021-04-17 (×19): 15 mL via OROMUCOSAL

## 2021-04-07 MED ORDER — INSULIN GLARGINE-YFGN 100 UNIT/ML ~~LOC~~ SOLN
4.0000 [IU] | Freq: Every day | SUBCUTANEOUS | Status: DC
  Administered 2021-04-07 – 2021-04-08 (×2): 4 [IU] via SUBCUTANEOUS
  Filled 2021-04-07 (×4): qty 0.04

## 2021-04-07 NOTE — Progress Notes (Addendum)
PROGRESS NOTE    Sheila Morgan  OVF:[id number] DOB: 19-Jan-1933 DOA: 04/05/2021 PCP: Binnie Rail, MD   Brief Narrative:Sheila Morgan is a 87 y.o. female with medical history significant of type 2 diabetes, hypothyroidism, hypertension, CAD, CABG, prior history of COVID has taken COVID vaccinations and monoclonal antibodies family called EMS as patient was acting different.  Family watches her through the camera and noticed that she was acting different and confused.  When they went to check on her they found her very weak and confused with fever. Complains of cough, denies shortness of breath nausea vomiting or diarrhea. Family thought she had foul-smelling urine and increased urinary frequency.  Family noticed that patient was struggling to get out of the chair. Her p.o. intake has been poor with decreased appetite. History is obtained from the patient's daughter as patient is confused and unable to provide any relevant information.    Assessment & Plan:   Principal Problem:   COVID   #1 sepsis present on admission due to [place]. On admission patient was tachypneic and tachycardic with leukocytosis and fever of 101.2. Patient has received vancomycin and cefepime in the ED.   Continue on Rocephin and azithromycin.   Preliminary UA does not appear to be UTI.  Urine culture mixed likely contaminant.  Follow-up blood cultures.  Blood cultures negative so far. Initial chest x-ray was clear however she was  wheezing coughing and more sleepier than usual.  Repeat chest x-ray 04/07/2023 no acute findings. Lactic acid trending down Pro-Cal trending down WBC trending down  #2 COVID without hypoxia- chest x-ray clear.  VBG shows pulse ox is normal. Repeat VBG Nebulizer treatments and Decadron Continue Paxlovid  #3 history of type 2 diabetes - hemoglobin A1c 7.2 and started sliding scale insulin CBG (last 3)  Recent Labs    04/06/21 2045 04/07/21 0755 04/07/21 1143  GLUCAP 240* 174*  31*   Was on metformin prior to admission.  I will start her on a low-dose Lantus.   #4 dehydration with elevated creatinine we will treat her with normal saline hold ACE inhibitor's metformin and Lasix.   #5 history of hypertension I will restart carvedilol.  Was on Norvasc and Imdur at home restart as needed.    #6 hyperlipidemia on Zocor   #7 hypothyroidism on Synthroid   #8 dvt on xarelto   #9 hypercalcemia resolved with IV hydration.    #10 thrombocytopenia new onset monitor daily could be related to COVID on Xarelto which should not cause low platelets  #84 metabolic encephalopathy secondary to sepsis secondary to COVID.  Mental status improving.  However still not back to her baseline.  Estimated body mass index is 26.07 kg/m as calculated from the following:   Height as of this encounter: 5\' 4"  (1.626 m).   Weight as of this encounter: 68.9 kg.  DVT prophylaxis: Xarelto Code Status: Full code Family Communication: Daughter Disposition Plan:  Status is: Inpatient  Remains inpatient appropriate because: IV antibiotics    Consultants: None  Procedures: None Antimicrobials: Rocephin azithromycin and PaxLOVID  Subjective Patient is more awake and alert today She lives alone and the family watches her on the camera remotely. Worked with physical therapy she answered my questions appropriately and followed my commands. She feels her breathing is okay.  Objective: Vitals:   04/06/21 2048 04/07/21 0021 04/07/21 0440 04/07/21 1228  BP: 125/63 104/65 124/77 140/85  Pulse: 72 82 94 83  Resp: 16 14 14 18   Temp: 98.6  F (37 C) 98 F (36.7 C) 98.2 F (36.8 C) 98 F (36.7 C)  TempSrc: Axillary Axillary Axillary Oral  SpO2: 95% 100% 100% 100%  Weight:      Height:        Intake/Output Summary (Last 24 hours) at 04/07/2021 1239 Last data filed at 04/07/2021 1232 Gross per 24 hour  Intake 2106.93 ml  Output 450 ml  Net 1656.93 ml    Filed Weights   04/05/21  1531 04/05/21 2026  Weight: 71.2 kg 68.9 kg    Examination:  General exam: Appears frail and elderly ill looking  respiratory system: wheezing  to auscultation. Respiratory effort normal. Cardiovascular system: S1 & S2 heard, RRR. No JVD, murmurs, rubs, gallops or clicks.  Gastrointestinal system: Abdomen is nondistended, soft and nontender. No organomegaly or masses felt. Normal bowel sounds heard. Central nervous system: awake oriented moves all extremities extremities: Trace bilateral pitting edema. Skin: No rashes, lesions or ulcers Psychiatry: Unable to assess   Data Reviewed: I have personally reviewed following labs and imaging studies  CBC: Recent Labs  Lab 04/05/21 1303 04/06/21 0528 04/07/21 0146  WBC 13.2* 8.7 6.7  NEUTROABS 11.5*  --  5.8  HGB 12.9 11.6* 12.4  HCT 38.8 35.4* 38.1  MCV 92.2 91.9 93.6  PLT 164 133* 129*    Basic Metabolic Panel: Recent Labs  Lab 04/05/21 1303 04/06/21 0528 04/07/21 0146  NA 137 135 139  K 3.6 3.1* 4.0  CL 99 104 107  CO2 27 25 23   GLUCOSE 167* 119* 228*  BUN 11 11 14   CREATININE 1.13* 1.04* 1.16*  CALCIUM 10.7* 9.0 8.7*    GFR: Estimated Creatinine Clearance: 33.2 mL/min (A) (by C-G formula based on SCr of 1.16 mg/dL (H)). Liver Function Tests: Recent Labs  Lab 04/05/21 1303 04/06/21 0528 04/07/21 0146  AST 26 34 38  ALT 18 17 21   ALKPHOS 71 57 59  BILITOT 1.0 1.0 0.7  PROT 7.3 5.9* 6.6  ALBUMIN 4.0 3.1* 3.0*    No results for input(s): LIPASE, AMYLASE in the last 168 hours. Recent Labs  Lab 04/07/21 0146  AMMONIA 41*   Coagulation Profile: Recent Labs  Lab 04/05/21 1303 04/06/21 0528  INR 2.0* 3.1*    Cardiac Enzymes: No results for input(s): CKTOTAL, CKMB, CKMBINDEX, TROPONINI in the last 168 hours. BNP (last 3 results) No results for input(s): PROBNP in the last 8760 hours. HbA1C: Recent Labs    04/05/21 1303  HGBA1C 7.2*    CBG: Recent Labs  Lab 04/06/21 1203 04/06/21 1645  04/06/21 2045 04/07/21 0755 04/07/21 1143  GLUCAP 106* 132* 240* 174* 193*    Lipid Profile: No results for input(s): CHOL, HDL, LDLCALC, TRIG, CHOLHDL, LDLDIRECT in the last 72 hours. Thyroid Function Tests: Recent Labs    04/06/21 0528  TSH 1.263    Anemia Panel: No results for input(s): VITAMINB12, FOLATE, FERRITIN, TIBC, IRON, RETICCTPCT in the last 72 hours. Sepsis Labs: Recent Labs  Lab 04/05/21 1733 04/05/21 1828 04/05/21 2140 04/06/21 0528 04/06/21 1437 04/06/21 1645 04/07/21 0146  PROCALCITON 0.22  --   --  0.87  --   --  0.69  LATICACIDVEN  --    < > 2.9*  --  1.1 1.9 1.9   < > = values in this interval not displayed.     Recent Results (from the past 240 hour(s))  Blood Culture (routine x 2)     Status: None (Preliminary result)   Collection Time: 04/05/21  1:03 PM   Specimen: BLOOD  Result Value Ref Range Status   Specimen Description   Final    BLOOD BLOOD RIGHT FOREARM Performed at [facility] [address], [place], [place] [address]    Special Requests   Final    BOTTLES DRAWN AEROBIC AND ANAEROBIC Blood Culture results may not be optimal due to an inadequate volume of blood received in culture bottles Performed at [facility] [address], [place], [place] [address]    Culture   Final    NO GROWTH 2 DAYS Performed at [facility] [address] [place], [place] [address]    Report Status PENDING  Incomplete  Blood Culture (routine x 2)     Status: None (Preliminary result)   Collection Time: 04/05/21  1:03 PM   Specimen: BLOOD  Result Value Ref Range Status   Specimen Description   Final    BLOOD BLOOD LEFT WRIST Performed at [facility] [address] [place], [place] [address]    Special Requests   Final    BOTTLES DRAWN AEROBIC AND ANAEROBIC Blood Culture adequate volume Performed at [facility] [address], [place], [place] [address]     Culture   Final    NO GROWTH 2 DAYS Performed at [facility] [address], [place], [place] [address]    Report Status PENDING  Incomplete  Resp Panel by RT-PCR (Flu A&B, Covid)     Status: Abnormal   Collection Time: 04/05/21  1:03 PM   Specimen: Nasopharyngeal(NP) swabs in vial transport medium  Result Value Ref Range Status   SARS Coronavirus 2 by RT PCR POSITIVE (A) NEGATIVE Final    Comment: RESULT CALLED TO, READ BACK BY AND VERIFIED WITH: GARRISON G. RN ON 04/05/2021 @ 1556 BY MECIAL J. (NOTE) SARS-CoV-2 target nucleic acids are DETECTED.  The SARS-CoV-2 RNA is generally detectable in upper respiratory specimens during the acute phase of infection. Positive results are indicative of the presence of the identified virus, but do not rule out bacterial infection or co-infection with other pathogens not detected by the test. Clinical correlation with patient history and other diagnostic information is necessary to determine patient infection status. The expected result is Negative.  Fact Sheet for Patients: EntrepreneurPulse.com.au  Fact Sheet for Healthcare Providers: IncredibleEmployment.be  This test is not yet approved or cleared by the [place] FDA and  has been authorized for detection and/or diagnosis of SARS-CoV-2 by FDA under an Emergency Use Authorization (EUA).  This EUA will remain in effect (meaning t his test can be used) for the duration of  the COVID-19 declaration under Section 564(b)(1) of the Act, 21 U.S.C. section 360bbb-3(b)(1), unless the authorization is terminated or revoked sooner.     Influenza A by PCR NEGATIVE NEGATIVE Final   Influenza B by PCR NEGATIVE NEGATIVE Final    Comment: (NOTE) The Xpert Xpress SARS-CoV-2/FLU/RSV plus assay is intended as an aid in the diagnosis of influenza from Nasopharyngeal swab specimens and should not be used as a sole basis for treatment. Nasal washings  and aspirates are unacceptable for Xpert Xpress SARS-CoV-2/FLU/RSV testing.  Fact Sheet for Patients: EntrepreneurPulse.com.au  Fact Sheet for Healthcare Providers: IncredibleEmployment.be  This test is not yet approved or cleared by the [place] FDA and has been authorized for detection and/or diagnosis of SARS-CoV-2 by FDA under an Emergency Use Authorization (EUA). This EUA will remain in effect (meaning  this test can be used) for the duration of the COVID-19 declaration under Section 564(b)(1) of the Act, 21 U.S.C. section 360bbb-3(b)(1), unless the authorization is terminated or revoked.  Performed at [facility], [place] [address], [place], [place] [address]   Urine Culture     Status: Abnormal   Collection Time: 04/05/21  4:02 PM   Specimen: In/Out Cath Urine  Result Value Ref Range Status   Specimen Description   Final    IN/OUT CATH URINE Performed at [facility] [address] [place], [place] [address]    Special Requests   Final    NONE Performed at [facility], [place] [address], [place], [place] [address]    Culture MULTIPLE SPECIES PRESENT, SUGGEST RECOLLECTION (A)  Final   Report Status 04/07/2021 FINAL  Final  MRSA Next Gen by PCR, Nasal     Status: None   Collection Time: 04/06/21  4:54 PM   Specimen: Nasal Mucosa; Nasal Swab  Result Value Ref Range Status   MRSA by PCR Next Gen NOT DETECTED NOT DETECTED Final    Comment: (NOTE) The GeneXpert MRSA Assay (FDA approved for NASAL specimens only), is one component of a comprehensive MRSA colonization surveillance program. It is not intended to diagnose MRSA infection nor to guide or monitor treatment for MRSA infections. Test performance is not FDA approved in patients less than 64 years old. Performed at [facility], [place] [address] [place], [place] [address]           Radiology  Studies: DG Chest 1 View  Result Date: 04/06/2021 CLINICAL DATA:  Hypoxia EXAM: CHEST  1 VIEW COMPARISON:  04/05/2021 FINDINGS: Lungs are essentially clear. Biapical pleural-parenchymal scarring. No pleural effusion or pneumothorax. The heart is normal in size. Postsurgical changes related to prior CABG. Degenerative changes of the bilateral shoulders. IMPRESSION: No evidence of acute cardiopulmonary disease. Electronically Signed   By: Julian Hy M.D.   On: 04/06/2021 20:39   CT Head Wo Contrast  Result Date: 04/05/2021 CLINICAL DATA:  Altered mental status EXAM: CT HEAD WITHOUT CONTRAST TECHNIQUE: Contiguous axial images were obtained from the base of the skull through the vertex without intravenous contrast. COMPARISON:  06/15/2018 FINDINGS: Brain: There are no signs of bleeding within the cranium. Ventricles are not dilated. Cortical sulci are prominent. There is decreased density in the subcortical and periventricular white matter. Vascular: Scattered arterial calcifications are seen. Skull: Unremarkable. Sinuses/Orbits: Unremarkable. Other: There is increased amount of CSF insula suggesting partial empty sella. IMPRESSION: No acute intracranial findings are seen in noncontrast CT brain. Atrophy. Small-vessel disease. Electronically Signed   By: Elmer Picker M.D.   On: 04/05/2021 14:56        Scheduled Meds:  carvedilol  25 mg Oral BID WC   dexamethasone (DECADRON) injection  4 mg Intravenous Q12H   insulin aspart  0-6 Units Subcutaneous TID WC   levothyroxine  50 mcg Oral Q0600   mouth rinse  15 mL Mouth Rinse BID   molnupiravir EUA  4 capsule Oral BID   rivaroxaban  20 mg Oral Q supper   Continuous Infusions:  sodium chloride 75 mL/hr at 04/06/21 1927   azithromycin 500 mg (04/06/21 1516)   cefTRIAXone (ROCEPHIN)  IV 1 g (04/06/21 1442)     LOS: 2 days   Georgette Shell, MD 04/07/2021, 12:39 PM

## 2021-04-07 NOTE — Plan of Care (Signed)
Assumed care of patient at 1900.  Patient continued to be drowsy/obtunded overnight.  Held HS PO medications and AM medications due to concerns about patient following commands/safely being able to swallow.   Patient also in Afib overnight.  Requiring 3L O2 via [place] to maintain saturations and has moist, productive cough with copious secretions.   MD & charge nurse aware.  Lab work ordered.  Continuing to closely monitor.   Problem: Clinical Measurements: Goal: Ability to maintain clinical measurements within normal limits will improve Outcome: Progressing Goal: Diagnostic test results will improve Outcome: Progressing   Problem: Elimination: Goal: Will not experience complications related to urinary retention Outcome: Progressing   Problem: Education: Goal: Knowledge of risk factors and measures for prevention of condition will improve Outcome: Not Progressing   Problem: Coping: Goal: Psychosocial and spiritual needs will be supported Outcome: Not Progressing   Problem: Respiratory: Goal: Will maintain a patent airway Outcome: Not Progressing Goal: Complications related to the disease process, condition or treatment will be avoided or minimized Outcome: Not Progressing   Problem: Education: Goal: Knowledge of General Education information will improve Description: Including pain rating scale, medication(s)/side effects and non-pharmacologic comfort measures Outcome: Not Progressing   Problem: Clinical Measurements: Goal: Will remain free from infection Outcome: Not Progressing Goal: Respiratory complications will improve Outcome: Not Progressing Goal: Cardiovascular complication will be avoided Outcome: Not Progressing   Problem: Activity: Goal: Risk for activity intolerance will decrease Outcome: Not Progressing   Problem: Nutrition: Goal: Adequate nutrition will be maintained Outcome: Not Progressing   Problem: Elimination: Goal: Will not experience complications  related to bowel motility Outcome: Not Progressing

## 2021-04-07 NOTE — Evaluation (Signed)
Clinical/Bedside Swallow Evaluation Patient Details  Name: Sheila Morgan MRN: [id number] Date of Birth: [birth year]  Today's Date: 04/07/2021 Time: SLP Start Time (ACUTE ONLY): 1445 SLP Stop Time (ACUTE ONLY): 1505 SLP Time Calculation (min) (ACUTE ONLY): 20 min  Past Medical History:  Past Medical History:  Diagnosis Date   Arthritis    Cancer ([place])    skin on nose .  mylenoma   Coronary artery disease    status post post LAD angioplasty in 1993  and subsequent coronary artery bypass grafting x1 with a LIMA to the LAD July of 1994. She had stenting of her RCA with a bare-metal stent November 2002.   Diabetes mellitus    2   Gout    Heart murmur    Hematoma    Right groin   Hemorrhoids    Hiatal hernia    History of blood transfusion    HOH (hard of hearing)    Hyperlipidemia    Hypertension    Left knee DJD    Lightheadedness    has seen Dr Gwenlyn Found and ENT.. cant find out why   Myocardial infarction [facility])    Osteoporosis    PONV (postoperative nausea and vomiting)    Shingles 10/27/2013   Past Surgical History:  Past Surgical History:  Procedure Laterality Date   CORONARY ANGIOPLASTY  1993   LAD   CORONARY ANGIOPLASTY WITH STENT PLACEMENT  03/09/01   RCA   CORONARY ARTERY BYPASS GRAFT  1994   JOINT REPLACEMENT Right 1998   hip   NM MYOVIEW LTD  03/2012   Normal   SKIN GRAFT     to nose skin cancer   TOTAL HIP ARTHROPLASTY     TOTAL KNEE ARTHROPLASTY Left 10/09/2013   Procedure: TOTAL KNEE ARTHROPLASTY;  Surgeon: Lorn Junes, MD;  Location: [facility];  Service: Orthopedics;  Laterality: Left;   HPI:  Patient is an 87 y.o. female with PMH: DM-2, hypothyroidism, CAD, CABG, prior h/o COVID (has gotten vaccinated). Family called EMS when they saw her through in-home video monitoring camera that they have setup and she was acting different and confused. When family went to check on patient, she was very weak, confused and febrile. PO intake has reportedly been poor as  well. On admission, she was febrile at 101.2 degrees F, COVID +, but without hypoxia as CXR was clear. Preliminary UA was negative for UTI. Patient was dehydrated.    Assessment / Plan / Recommendation  Clinical Impression  Patient did not present with any clinicial s/s dysphagia as per this bedside swallow evaluation, however RN reported that previous date patient was having a lot of difficulty with PO's and daughter reporting she does better with softer (not pureed) foods. SLP downgraded patient's diet to Dys 3 solids (mechanical soft) and daughter in agreement with this. SLP plans to f/u for diet toleration at least one more time prior to s/o. SLP Visit Diagnosis: Dysphagia, unspecified (R13.10)    Aspiration Risk  Mild aspiration risk    Diet Recommendation Dysphagia 3 (Mech soft);Thin liquid   Liquid Administration via: Cup;Straw Medication Administration: Whole meds with puree Supervision: Patient able to self feed;Full supervision/cueing for compensatory strategies Compensations: Minimize environmental distractions;Slow rate;Small sips/bites Postural Changes: Seated upright at 90 degrees    Other  Recommendations Oral Care Recommendations: Oral care BID;Staff/trained caregiver to provide oral care    Recommendations for follow up therapy are one component of a multi-disciplinary discharge planning process, led by the attending physician.  Recommendations may be updated based on patient status, additional functional criteria and insurance authorization.  Follow up Recommendations No SLP follow up      Assistance Recommended at Discharge None  Functional Status Assessment Patient has had a recent decline in their functional status and demonstrates the ability to make significant improvements in function in a reasonable and predictable amount of time.  Frequency and Duration min 1 x/week  1 week       Prognosis Prognosis for Safe Diet Advancement: Good      Swallow Study    General Date of Onset: 04/05/21 HPI: Patient is an 87 y.o. female with PMH: DM-2, hypothyroidism, CAD, CABG, prior h/o COVID (has gotten vaccinated). Family called EMS when they saw her through in-home video monitoring camera that they have setup and she was acting different and confused. When family went to check on patient, she was very weak, confused and febrile. PO intake has reportedly been poor as well. On admission, she was febrile at 101.2 degrees F, COVID +, but without hypoxia as CXR was clear. Preliminary UA was negative for UTI. Patient was dehydrated. Type of Study: Bedside Swallow Evaluation Previous Swallow Assessment: none found Diet Prior to this Study: Regular;Thin liquids Temperature Spikes Noted: No Respiratory Status: Nasal cannula History of Recent Intubation: No Behavior/Cognition: Alert;Cooperative;Pleasant mood Oral Cavity Assessment: Within Functional Limits Oral Care Completed by SLP: No Oral Cavity - Dentition: Edentulous;Dentures, not available;Missing dentition Vision: Functional for self-feeding Self-Feeding Abilities: Needs assist Patient Positioning: Upright in chair Baseline Vocal Quality: Normal Volitional Cough: Strong Volitional Swallow: Able to elicit    Oral/Motor/Sensory Function Overall Oral Motor/Sensory Function: Within functional limits   Ice Chips     Thin Liquid Thin Liquid: Within functional limits Presentation: Straw    Nectar Thick     Honey Thick     Puree Puree: Not tested   Solid     Solid: Within functional limits     Sonia Baller, MA, CCC-SLP Speech Therapy

## 2021-04-07 NOTE — Evaluation (Signed)
Physical Therapy Evaluation Patient Details Name: Sheila Morgan MRN: [id number] DOB: Nov 27, 1932 Today's Date: 04/07/2021  History of Present Illness  87 yo female admitted with confusion, sepsis, COVID. Hx of CAD, OA, gout, hearing loss, R shoulder radiculopathy, DM, CABG, shingles, MI, falls  Clinical Impression  On eval, pt required Min A for mobility. She walked a short distance around the room with a RW. Incontinent of stool-total assist for hygiene. HR 113 bpm, O2 96% on RA during session. Pt presents with general weakness, decreased activity tolerance, and impaired gait and balance. No family present during session. Pt reports she has issues with her memory. Will plan to follow and progress activity as tolerated.        Recommendations for follow up therapy are one component of a multi-disciplinary discharge planning process, led by the attending physician.  Recommendations may be updated based on patient status, additional functional criteria and insurance authorization.  Follow Up Recommendations Skilled nursing-short term rehab (<3 hours/day) (vs HHPT-depending on progress and assist available at home)    Assistance Recommended at Discharge    Functional Status Assessment Patient has had a recent decline in their functional status and demonstrates the ability to make significant improvements in function in a reasonable and predictable amount of time.  Equipment Recommendations  None recommended by PT    Recommendations for Other Services       Precautions / Restrictions Precautions Precautions: Fall Precaution Comments: monitor HR and O2 Restrictions Weight Bearing Restrictions: No      Mobility  Bed Mobility Overal bed mobility: Needs Assistance Bed Mobility: Supine to Sit     Supine to sit: Min guard;HOB elevated     General bed mobility comments: Min guard for safety. Increased time. Cues provided.    Transfers Overall transfer level: Needs  assistance Equipment used: Rolling walker (2 wheels) Transfers: Sit to/from Stand;Bed to chair/wheelchair/BSC Sit to Stand: Min assist   Step pivot transfers: Min assist       General transfer comment: Assist to rise, steady, control descent. Cues for safety, technique, hand placement.    Ambulation/Gait Ambulation/Gait assistance: Min assist Gait Distance (Feet): 5 Feet Assistive device: Rolling walker (2 wheels) Gait Pattern/deviations: Step-through pattern;Decreased stride length;Trunk flexed       General Gait Details: Assist to steady. O2 96% on RA, HR 113 bpm  Stairs            Wheelchair Mobility    Modified Rankin (Stroke Patients Only)       Balance Overall balance assessment: Needs assistance;History of Falls         Standing balance support: Bilateral upper extremity supported;Reliant on assistive device for balance Standing balance-Leahy Scale: Poor                               Pertinent Vitals/Pain Pain Assessment: Faces Faces Pain Scale: No hurt    Home Living Family/patient expects to be discharged to:: Unsure   Available Help at Discharge: Family;Available PRN/intermittently;Friend(s) Type of Home: House Home Access: Level entry       Home Layout: Bed/bath upstairs Home Equipment: Conservation officer, nature (2 wheels);Cane - single point;Shower seat Additional Comments: has stair lift    Prior Function Prior Level of Function : Needs assist             Mobility Comments: pt stated she has "someone that comes out 2x/week...helps on stairs" ADLs Comments: performs bathing, dressing unassisted  Hand Dominance        Extremity/Trunk Assessment   Upper Extremity Assessment Upper Extremity Assessment: Defer to OT evaluation    Lower Extremity Assessment Lower Extremity Assessment: Generalized weakness    Cervical / Trunk Assessment Cervical / Trunk Assessment: Normal  Communication   Communication: HOH   Cognition Arousal/Alertness: Awake/alert Behavior During Therapy: WFL for tasks assessed/performed Overall Cognitive Status: No family/caregiver present to determine baseline cognitive functioning Area of Impairment: Memory;Problem solving;Safety/judgement                     Memory: Decreased short-term memory   Safety/Judgement: Decreased awareness of safety   Problem Solving: Requires verbal cues          General Comments      Exercises     Assessment/Plan    PT Assessment Patient needs continued PT services  PT Problem List Decreased strength;Decreased mobility;Decreased activity tolerance;Decreased balance;Decreased knowledge of use of DME       PT Treatment Interventions DME instruction;Gait training;Therapeutic activities;Therapeutic exercise;Patient/family education;Balance training;Functional mobility training    PT Goals (Current goals can be found in the Care Plan section)  Acute Rehab PT Goals Patient Stated Goal: to get better PT Goal Formulation: With patient Time For Goal Achievement: 04/21/21 Potential to Achieve Goals: Good    Frequency Min 3X/week   Barriers to discharge Decreased caregiver support      Co-evaluation               AM-PAC PT "6 Clicks" Mobility  Outcome Measure Help needed turning from your back to your side while in a flat bed without using bedrails?: A Little Help needed moving from lying on your back to sitting on the side of a flat bed without using bedrails?: A Little Help needed moving to and from a bed to a chair (including a wheelchair)?: A Little Help needed standing up from a chair using your arms (e.g., wheelchair or bedside chair)?: A Little Help needed to walk in hospital room?: A Little Help needed climbing 3-5 steps with a railing? : A Lot 6 Click Score: 17    End of Session Equipment Utilized During Treatment: Gait belt Activity Tolerance: Patient tolerated treatment well Patient left: in  chair;with call bell/phone within reach;with chair alarm set   PT Visit Diagnosis: History of falling (Z91.81);Unsteadiness on feet (R26.81);Muscle weakness (generalized) (M62.81);Difficulty in walking, not elsewhere classified (R26.2)    Time: 8588-5027 PT Time Calculation (min) (ACUTE ONLY): 27 min   Charges:   PT Evaluation $PT Eval Moderate Complexity: 1 Mod PT Treatments $Gait Training: 8-22 mins           Doreatha Massed, PT Acute Rehabilitation  Office: [phone] Pager: [phone]

## 2021-04-08 DIAGNOSIS — U071 COVID-19: Secondary | ICD-10-CM | POA: Diagnosis not present

## 2021-04-08 LAB — COMPREHENSIVE METABOLIC PANEL
ALT: 21 U/L (ref 0–44)
AST: 27 U/L (ref 15–41)
Albumin: 3 g/dL — ABNORMAL LOW (ref 3.5–5.0)
Alkaline Phosphatase: 48 U/L (ref 38–126)
Anion gap: 9 (ref 5–15)
BUN: 22 mg/dL (ref 8–23)
CO2: 21 mmol/L — ABNORMAL LOW (ref 22–32)
Calcium: 8.8 mg/dL — ABNORMAL LOW (ref 8.9–10.3)
Chloride: 110 mmol/L (ref 98–111)
Creatinine, Ser: 1.05 mg/dL — ABNORMAL HIGH (ref 0.44–1.00)
GFR, Estimated: 52 mL/min — ABNORMAL LOW (ref >60)
Glucose, Bld: 200 mg/dL — ABNORMAL HIGH (ref 70–99)
Potassium: 3.7 mmol/L (ref 3.5–5.1)
Sodium: 140 mmol/L (ref 135–145)
Total Bilirubin: 0.5 mg/dL (ref 0.3–1.2)
Total Protein: 6.4 g/dL — ABNORMAL LOW (ref 6.5–8.1)

## 2021-04-08 LAB — CBC
HCT: 36 % (ref 36.0–46.0)
Hemoglobin: 12 g/dL (ref 12.0–15.0)
MCH: 30.5 pg (ref 26.0–34.0)
MCHC: 33.3 g/dL (ref 30.0–36.0)
MCV: 91.4 fL (ref 80.0–100.0)
Platelets: 148 10*3/uL — ABNORMAL LOW (ref 150–400)
RBC: 3.94 MIL/uL (ref 3.87–5.11)
RDW: 13.8 % (ref 11.5–15.5)
WBC: 10.5 10*3/uL (ref 4.0–10.5)
nRBC: 0 % (ref 0.0–0.2)

## 2021-04-08 LAB — GLUCOSE, CAPILLARY
Glucose-Capillary: 191 mg/dL — ABNORMAL HIGH (ref 70–99)
Glucose-Capillary: 206 mg/dL — ABNORMAL HIGH (ref 70–99)
Glucose-Capillary: 278 mg/dL — ABNORMAL HIGH (ref 70–99)
Glucose-Capillary: 284 mg/dL — ABNORMAL HIGH (ref 70–99)

## 2021-04-08 MED ORDER — LACTATED RINGERS IV BOLUS
250.0000 mL | Freq: Once | INTRAVENOUS | Status: AC
  Administered 2021-04-08: 250 mL via INTRAVENOUS

## 2021-04-08 MED ORDER — DEXAMETHASONE 4 MG PO TABS
6.0000 mg | ORAL_TABLET | Freq: Every day | ORAL | Status: DC
  Administered 2021-04-08 – 2021-04-13 (×6): 6 mg via ORAL
  Filled 2021-04-08 (×6): qty 1

## 2021-04-08 MED ORDER — AZITHROMYCIN 250 MG PO TABS
500.0000 mg | ORAL_TABLET | Freq: Every day | ORAL | Status: AC
  Administered 2021-04-08 – 2021-04-10 (×3): 500 mg via ORAL
  Filled 2021-04-08 (×3): qty 2

## 2021-04-08 MED ORDER — METOPROLOL TARTRATE 5 MG/5ML IV SOLN
2.5000 mg | Freq: Once | INTRAVENOUS | Status: AC | PRN
  Administered 2021-04-08: 2.5 mg via INTRAVENOUS
  Filled 2021-04-08: qty 5

## 2021-04-08 NOTE — Care Management Important Message (Signed)
Important Message  Patient Details IM Letter given to the Patient. Name: Sheila Morgan MRN: [id number] Date of Birth: Sep 22, 1932   Medicare Important Message Given:  Yes     Kerin Salen 04/08/2021, 12:39 PM

## 2021-04-08 NOTE — Progress Notes (Addendum)
PROGRESS NOTE    [name]  PFX:[id number] DOB: 1932-05-15 DOA: 04/05/2021 PCP: Binnie Rail, MD   Brief Narrative:Sheila Morgan is a 87 y.o. female with medical history significant of type 2 diabetes, hypothyroidism, hypertension, CAD, CABG, prior history of COVID has taken COVID vaccinations and monoclonal antibodies family called EMS as patient was acting different.  Family watches her through the camera and noticed that she was acting different and confused.  When they went to check on her they found her very weak and confused with fever. Complains of cough, denies shortness of breath nausea vomiting or diarrhea. Family thought she had foul-smelling urine and increased urinary frequency.  Family noticed that patient was struggling to get out of the chair. Her p.o. intake has been poor with decreased appetite. History is obtained from the patient's daughter as patient is confused and unable to provide any relevant information.    Assessment & Plan:   Principal Problem:   COVID   #1 sepsis present on admission due to [place]. On admission patient was tachypneic and tachycardic with leukocytosis and fever of 101.2. Patient has received vancomycin and cefepime in the ED.   Patient spiked a temp while she was not on antibiotics. So she was started on Rocephin and azithromycin, finish the course for 5 days. Preliminary UA does not appear to be UTI.  Urine culture mixed likely contaminant.  Follow-up blood cultures.  Blood cultures negative so far. Initial chest x-ray was clear however she was  wheezing coughing and more sleepier than usual. repeat chest x-ray 04/07/2023 no acute findings. Lactic acid trending down Pro-Cal trending down WBC trending down  #2 COVID without hypoxia- chest x-ray clear.   Continue Paxlovid Continue Decadron and symptomatic treatments and nebulizers.  #3 history of type 2 diabetes - hemoglobin A1c 7.2 and started sliding scale insulin CBG (last 3)   Recent Labs    04/07/21 2119 04/08/21 0806 04/08/21 1116  GLUCAP 189* 191* 64*    Was on metformin prior to admission.  I will start her on a low-dose Lantus.   #4 dehydration patient received IV fluids this was stopped as her p.o. intake improved.  Continue to hold ACE inhibitor, metformin, Lasix.    #5 history of hypertension blood pressure improved 125/88 after restarting carvedilol.  Was on Norvasc and Imdur at home restart as needed.    #6 hyperlipidemia on Zocor   #7 hypothyroidism on Synthroid   #8 dvt on xarelto   #9 hypercalcemia resolved with IV hydration.    #10 thrombocytopenia improving platelets 148.    #32 metabolic encephalopathy secondary to sepsis secondary to COVID.  Mental status improving.  However still not back to her baseline.  #12 goals of care patient is full code.  Seen by physical therapy recommending SNF.  Estimated body mass index is 26.07 kg/m as calculated from the following:   Height as of this encounter: 5\' 4"  (1.626 m).   Weight as of this encounter: 68.9 kg.  DVT prophylaxis: Xarelto Code Status: Full code Family Communication: Discussed with daughter on the phone Disposition Plan:  Status is: Inpatient  Remains inpatient appropriate because: IV antibiotics    Consultants: None  Procedures: None Antimicrobials: Rocephin azithromycin and PaxLOVID  Subjective She is sitting up in chair awake and alert Denies shortness of breath or chest pain Objective: Vitals:   04/07/21 1228 04/07/21 2124 04/08/21 0453 04/08/21 1120  BP: 140/85 120/88 (!) 131/100 125/88  Pulse: 83 (!) 110 83 77  Resp: 18 20 17 18   Temp: 98 F (36.7 C) 98.2 F (36.8 C) (!) 97.4 F (36.3 C) 97.6 F (36.4 C)  TempSrc: Oral Oral Oral Oral  SpO2: 100% 100% 100% 99%  Weight:      Height:        Intake/Output Summary (Last 24 hours) at 04/08/2021 1404 Last data filed at 04/08/2021 0600 Gross per 24 hour  Intake 490 ml  Output 701 ml  Net -211 ml     Filed Weights   04/05/21 1531 04/05/21 2026  Weight: 71.2 kg 68.9 kg    Examination:  General exam: Appears frail and elderly ill looking  respiratory system: wheezing  to auscultation. Respiratory effort normal. Cardiovascular system: S1 & S2 heard, RRR. No JVD, murmurs, rubs, gallops or clicks.  Gastrointestinal system: Abdomen is nondistended, soft and nontender. No organomegaly or masses felt. Normal bowel sounds heard. Central nervous system: awake oriented moves all extremities extremities: Trace bilateral pitting edema. Skin: No rashes, lesions or ulcers Psychiatry: Unable to assess   Data Reviewed: I have personally reviewed following labs and imaging studies  CBC: Recent Labs  Lab 04/05/21 1303 04/06/21 0528 04/07/21 0146 04/08/21 0910  WBC 13.2* 8.7 6.7 10.5  NEUTROABS 11.5*  --  5.8  --   HGB 12.9 11.6* 12.4 12.0  HCT 38.8 35.4* 38.1 36.0  MCV 92.2 91.9 93.6 91.4  PLT 164 133* 129* 148*    Basic Metabolic Panel: Recent Labs  Lab 04/05/21 1303 04/06/21 0528 04/07/21 0146 04/08/21 0910  NA 137 135 139 140  K 3.6 3.1* 4.0 3.7  CL 99 104 107 110  CO2 27 25 23  21*  GLUCOSE 167* 119* 228* 200*  BUN 11 11 14 22   CREATININE 1.13* 1.04* 1.16* 1.05*  CALCIUM 10.7* 9.0 8.7* 8.8*    GFR: Estimated Creatinine Clearance: 36.7 mL/min (A) (by C-G formula based on SCr of 1.05 mg/dL (H)). Liver Function Tests: Recent Labs  Lab 04/05/21 1303 04/06/21 0528 04/07/21 0146 04/08/21 0910  AST 26 34 38 27  ALT 18 17 21 21   ALKPHOS 71 57 59 48  BILITOT 1.0 1.0 0.7 0.5  PROT 7.3 5.9* 6.6 6.4*  ALBUMIN 4.0 3.1* 3.0* 3.0*    No results for input(s): LIPASE, AMYLASE in the last 168 hours. Recent Labs  Lab 04/07/21 0146  AMMONIA 41*    Coagulation Profile: Recent Labs  Lab 04/05/21 1303 04/06/21 0528  INR 2.0* 3.1*    Cardiac Enzymes: No results for input(s): CKTOTAL, CKMB, CKMBINDEX, TROPONINI in the last 168 hours. BNP (last 3 results) No  results for input(s): PROBNP in the last 8760 hours. HbA1C: No results for input(s): HGBA1C in the last 72 hours.  CBG: Recent Labs  Lab 04/07/21 1143 04/07/21 1624 04/07/21 2119 04/08/21 0806 04/08/21 1116  GLUCAP 193* 218* 189* 191* 206*    Lipid Profile: No results for input(s): CHOL, HDL, LDLCALC, TRIG, CHOLHDL, LDLDIRECT in the last 72 hours. Thyroid Function Tests: Recent Labs    04/06/21 0528  TSH 1.263    Anemia Panel: No results for input(s): VITAMINB12, FOLATE, FERRITIN, TIBC, IRON, RETICCTPCT in the last 72 hours. Sepsis Labs: Recent Labs  Lab 04/05/21 1733 04/05/21 1828 04/05/21 2140 04/06/21 0528 04/06/21 1437 04/06/21 1645 04/07/21 0146  PROCALCITON 0.22  --   --  0.87  --   --  0.69  LATICACIDVEN  --    < > 2.9*  --  1.1 1.9 1.9   < > = values  in this interval not displayed.     Recent Results (from the past 240 hour(s))  Blood Culture (routine x 2)     Status: None (Preliminary result)   Collection Time: 04/05/21  1:03 PM   Specimen: BLOOD  Result Value Ref Range Status   Specimen Description   Final    BLOOD BLOOD RIGHT FOREARM Performed at [facility] [address], [place], [place] [address]    Special Requests   Final    BOTTLES DRAWN AEROBIC AND ANAEROBIC Blood Culture results may not be optimal due to an inadequate volume of blood received in culture bottles Performed at [facility] [address], [place], [place] [address]    Culture   Final    NO GROWTH 3 DAYS Performed at [facility], [place] [address], [place], [place] [address]    Report Status PENDING  Incomplete  Blood Culture (routine x 2)     Status: None (Preliminary result)   Collection Time: 04/05/21  1:03 PM   Specimen: BLOOD  Result Value Ref Range Status   Specimen Description   Final    BLOOD BLOOD LEFT WRIST Performed at [facility] [address], [place], [place] [address]    Special  Requests   Final    BOTTLES DRAWN AEROBIC AND ANAEROBIC Blood Culture adequate volume Performed at [facility] [address], [place], [place] [address]    Culture   Final    NO GROWTH 3 DAYS Performed at [facility], [place] [address], [place], [place] [address]    Report Status PENDING  Incomplete  Resp Panel by RT-PCR (Flu A&B, Covid)     Status: Abnormal   Collection Time: 04/05/21  1:03 PM   Specimen: Nasopharyngeal(NP) swabs in vial transport medium  Result Value Ref Range Status   SARS Coronavirus 2 by RT PCR POSITIVE (A) NEGATIVE Final    Comment: RESULT CALLED TO, READ BACK BY AND VERIFIED WITH: GARRISON G. RN ON 04/05/2021 @ 1556 BY MECIAL J. (NOTE) SARS-CoV-2 target nucleic acids are DETECTED.  The SARS-CoV-2 RNA is generally detectable in upper respiratory specimens during the acute phase of infection. Positive results are indicative of the presence of the identified virus, but do not rule out bacterial infection or co-infection with other pathogens not detected by the test. Clinical correlation with patient history and other diagnostic information is necessary to determine patient infection status. The expected result is Negative.  Fact Sheet for Patients: EntrepreneurPulse.com.au  Fact Sheet for Healthcare Providers: IncredibleEmployment.be  This test is not yet approved or cleared by the [place] FDA and  has been authorized for detection and/or diagnosis of SARS-CoV-2 by FDA under an Emergency Use Authorization (EUA).  This EUA will remain in effect (meaning t his test can be used) for the duration of  the COVID-19 declaration under Section 564(b)(1) of the Act, 21 U.S.C. section 360bbb-3(b)(1), unless the authorization is terminated or revoked sooner.     Influenza A by PCR NEGATIVE NEGATIVE Final   Influenza B by PCR NEGATIVE NEGATIVE Final    Comment: (NOTE) The Xpert Xpress  SARS-CoV-2/FLU/RSV plus assay is intended as an aid in the diagnosis of influenza from Nasopharyngeal swab specimens and should not be used as a sole basis for treatment. Nasal washings and aspirates are unacceptable for Xpert Xpress SARS-CoV-2/FLU/RSV testing.  Fact Sheet for Patients: EntrepreneurPulse.com.au  Fact Sheet for Healthcare Providers: IncredibleEmployment.be  This test  is not yet approved or cleared by the [place] and has been authorized for detection and/or diagnosis of SARS-CoV-2 by FDA under an Emergency Use Authorization (EUA). This EUA will remain in effect (meaning this test can be used) for the duration of the COVID-19 declaration under Section 564(b)(1) of the Act, 21 U.S.C. section 360bbb-3(b)(1), unless the authorization is terminated or revoked.  Performed at [facility], [place] [address], [place], [place] [address]   Urine Culture     Status: Abnormal   Collection Time: 04/05/21  4:02 PM   Specimen: In/Out Cath Urine  Result Value Ref Range Status   Specimen Description   Final    IN/OUT CATH URINE Performed at [facility] [address], [place], [place] [address]    Special Requests   Final    NONE Performed at [facility], [place] [address], [place], [place] [address]    Culture MULTIPLE SPECIES PRESENT, SUGGEST RECOLLECTION (A)  Final   Report Status 04/07/2021 FINAL  Final  MRSA Next Gen by PCR, Nasal     Status: None   Collection Time: 04/06/21  4:54 PM   Specimen: Nasal Mucosa; Nasal Swab  Result Value Ref Range Status   MRSA by PCR Next Gen NOT DETECTED NOT DETECTED Final    Comment: (NOTE) The GeneXpert MRSA Assay (FDA approved for NASAL specimens only), is one component of a comprehensive MRSA colonization surveillance program. It is not intended to diagnose MRSA infection nor to guide or monitor treatment for MRSA infections. Test  performance is not FDA approved in patients less than 61 years old. Performed at [facility], [place] [address], [place], [place] [address]           Radiology Studies: DG Chest 1 View  Result Date: 04/06/2021 CLINICAL DATA:  Hypoxia EXAM: CHEST  1 VIEW COMPARISON:  04/05/2021 FINDINGS: Lungs are essentially clear. Biapical pleural-parenchymal scarring. No pleural effusion or pneumothorax. The heart is normal in size. Postsurgical changes related to prior CABG. Degenerative changes of the bilateral shoulders. IMPRESSION: No evidence of acute cardiopulmonary disease. Electronically Signed   By: Julian Hy M.D.   On: 04/06/2021 20:39        Scheduled Meds:  azithromycin  500 mg Oral Daily   carvedilol  25 mg Oral BID WC   dexamethasone (DECADRON) injection  4 mg Intravenous Q12H   insulin aspart  0-6 Units Subcutaneous TID WC   insulin glargine-yfgn  4 Units Subcutaneous QHS   levothyroxine  50 mcg Oral Q0600   mouth rinse  15 mL Mouth Rinse BID   molnupiravir EUA  4 capsule Oral BID   rivaroxaban  15 mg Oral Q supper   Continuous Infusions:  cefTRIAXone (ROCEPHIN)  IV 1 g (04/08/21 0941)     LOS: 3 days   Georgette Shell, MD 04/08/2021, 2:04 PM

## 2021-04-08 NOTE — TOC Initial Note (Signed)
Transition of Care [facility]) - Initial/Assessment Note    Patient Details  Name: Sheila Morgan MRN: [id number] Date of Birth: May 25, 1932  [facility] CM/SW Contact:    Dessa Phi, RN Phone Number: 04/08/2021, 10:16 AM  Clinical Narrative:  PT recc SNF-spoke to dtr sharon/patient in agreement to fax out to SNF await bed offers.                 Expected Discharge Plan: Skilled Nursing Facility Barriers to Discharge: Continued Medical Work up   Patient Goals and CMS Choice Patient states their goals for this hospitalization and ongoing recovery are:: go to rehab CMS Medicare.gov Compare Post Acute Care list provided to:: Patient Represenative (must comment) Choice offered to / list presented to : Adult Children  Expected Discharge Plan and Services Expected Discharge Plan: [place]   Discharge Planning Services: CM Consult Post Acute Care Choice: [place] Living arrangements for the past 2 months: Single Family Home                                      Prior Living Arrangements/Services Living arrangements for the past 2 months: Single Family Home Lives with:: Adult Children Patient language and need for interpreter reviewed:: Yes Do you feel safe going back to the place where you live?: Yes      Need for Family Participation in Patient Care: No (Comment) Care giver support system in place?: Yes (comment)   Criminal Activity/Legal Involvement Pertinent to Current Situation/Hospitalization: No - Comment as needed  Activities of Daily Living Home Assistive Devices/Equipment: Other (Comment) ADL Screening (condition at time of admission) Patient's cognitive ability adequate to safely complete daily activities?: No Is the patient deaf or have difficulty hearing?: Yes Does the patient have difficulty seeing, even when wearing glasses/contacts?: No Does the patient have difficulty concentrating, remembering, or making  decisions?: Yes Patient able to express need for assistance with ADLs?: Yes Does the patient have difficulty dressing or bathing?: Yes Independently performs ADLs?: No Communication: Independent Dressing (OT): Needs assistance Is this a change from baseline?: Change from baseline, expected to last <3days Grooming: Needs assistance Is this a change from baseline?: Change from baseline, expected to last <3 days Feeding: Needs assistance Is this a change from baseline?: Change from baseline, expected to last >3 days Bathing: Needs assistance Is this a change from baseline?: Pre-admission baseline Toileting: Needs assistance Is this a change from baseline?: Change from baseline, expected to last <3 days In/Out Bed: Needs assistance Is this a change from baseline?: Change from baseline, expected to last <3 days Walks in Home: Independent with device (comment), Needs assistance Is this a change from baseline?: Change from baseline, expected to last >3 days Does the patient have difficulty walking or climbing stairs?: Yes Weakness of Legs: Both Weakness of Arms/Hands: None  Permission Sought/Granted Permission sought to share information with : Case Manager Permission granted to share information with : Yes, Verbal Permission Granted  Share Information with NAME: Case manager     Permission granted to share info w Relationship: [name] dtr 48 [phone]     Emotional Assessment Appearance:: Appears stated age   Affect (typically observed): Accepting Orientation: : Oriented to Self, Oriented to Place, Oriented to  Time, Oriented to Situation Alcohol / Substance Use: Not Applicable Psych Involvement: No (comment)  Admission diagnosis:  Transient alteration of awareness [R40.4] Sepsis,  due to unspecified organism, unspecified whether acute organ dysfunction present ([place]) [A41.9] COVID [U07.1] Patient Active Problem List   Diagnosis Date Noted   COVID 04/05/2021   Abrasion of right  hand 01/13/2021   Paroxysmal atrial fibrillation ([place]) 12/31/2020   Foot swelling 09/10/2020   Calcium pyrophosphate arthropathy of multiple sites 05/14/2020   Small vessel disease ([place]) 02/12/2020   Left leg DVT ([place]), 12/15/19 12/15/2019   Urinary frequency 02/22/2019   Fall 07/14/2018   Bilateral hearing loss 01/05/2018   Memory difficulties 01/05/2018   Multiple closed fractures of ribs of right side 01/19/2017   Acute pain of right shoulder 01/05/2017   Acute pain of right knee 01/05/2017   B12 deficiency 06/13/2015   Hypothyroidism 06/13/2015   DM (diabetes mellitus) type II, controlled, with peripheral vascular disorder ([place]) 03/06/2014   Gout 10/15/2013   Arthritis    Hiatal hernia    Coronary artery disease    Left knee DJD    Benign paroxysmal positional vertigo 05/19/2013   Weakness generalized 05/09/2013   Pseudogout of left knee 03/29/2013   Acute right lumbar radiculopathy 03/18/2013   Shoulder impingement syndrome 01/25/2013   Vertigo 10/17/2012   Renal insufficiency, mild 06/20/2011   Essential hypertension 12/31/2009   Osteoporosis - Dr Kathlene November 12/31/2009   SKIN CANCER, HX OF 12/31/2009   Proteinuria 01/05/2008   Anemia 12/21/2007   Hyperlipidemia 09/08/2006   Polymyalgia rheumatica ([place]) 09/08/2006   CORONARY ARTERY BYPASS GRAFT, HX OF 09/08/2006   PCP:  Binnie Rail, MD Pharmacy:   [facility]ORE [place], [place] - [address] AT [address] [place] [address] Phone: [phone] Fax: [phone]     Social Determinants of Health (SDOH) Interventions    Readmission Risk Interventions No flowsheet data found.

## 2021-04-08 NOTE — NC FL2 (Signed)
[place] MEDICAID FL2 LEVEL OF CARE SCREENING TOOL     IDENTIFICATION  Patient Name: Sheila Morgan Birthdate: 1932/12/09 Sex: female Admission Date (Current Location): 04/05/2021  [facility] [place] and [place] Number:  Herbalist and Address:  [facility],  [address] [place], [place]      Provider Number: [id number]  Attending Physician Name and Address:  Georgette Shell, MD  Relative Name and Phone Number:  Barnetta Hammersmith dtr [phone]    Current Level of Care: Hospital Recommended Level of Care: [place] Prior Approval Number:    Date Approved/Denied:   PASRR Number: [id number]  Discharge Plan: SNF    Current Diagnoses: Patient Active Problem List   Diagnosis Date Noted   COVID 04/05/2021   Abrasion of right hand 01/13/2021   Paroxysmal atrial fibrillation ([place]) 12/31/2020   Foot swelling 09/10/2020   Calcium pyrophosphate arthropathy of multiple sites 05/14/2020   Small vessel disease ([place]) 02/12/2020   Left leg DVT ([place]), 12/15/19 12/15/2019   Urinary frequency 02/22/2019   Fall 07/14/2018   Bilateral hearing loss 01/05/2018   Memory difficulties 01/05/2018   Multiple closed fractures of ribs of right side 01/19/2017   Acute pain of right shoulder 01/05/2017   Acute pain of right knee 01/05/2017   B12 deficiency 06/13/2015   Hypothyroidism 06/13/2015   DM (diabetes mellitus) type II, controlled, with peripheral vascular disorder ([place]) 03/06/2014   Gout 10/15/2013   Arthritis    Hiatal hernia    Coronary artery disease    Left knee DJD    Benign paroxysmal positional vertigo 05/19/2013   Weakness generalized 05/09/2013   Pseudogout of left knee 03/29/2013   Acute right lumbar radiculopathy 03/18/2013   Shoulder impingement syndrome 01/25/2013   Vertigo 10/17/2012   Renal insufficiency, mild 06/20/2011   Essential hypertension 12/31/2009   Osteoporosis - Dr Kathlene November 12/31/2009   SKIN CANCER, HX OF 12/31/2009    Proteinuria 01/05/2008   Anemia 12/21/2007   Hyperlipidemia 09/08/2006   Polymyalgia rheumatica ([place]) 09/08/2006   CORONARY ARTERY BYPASS GRAFT, HX OF 09/08/2006    Orientation RESPIRATION BLADDER Height & Weight     Self, Time, Situation, Place  O2 Continent Weight: 68.9 kg Height:  5\' 4"  (162.6 cm)  BEHAVIORAL SYMPTOMS/MOOD NEUROLOGICAL BOWEL NUTRITION STATUS      Continent Diet (dysphagia 3-mech soft)  AMBULATORY STATUS COMMUNICATION OF NEEDS Skin   Limited Assist Verbally Normal                       Personal Care Assistance Level of Assistance  Bathing, Feeding, Dressing   Feeding assistance: Limited assistance Dressing Assistance: Limited assistance     Functional Limitations Info  Sight, Hearing, Speech Sight Info: Impaired (eyeglasses) Hearing Info: Impaired (Has bilateral hearing aids  but won't wear them) Speech Info: Impaired (Dentures-top/bottom)    SPECIAL CARE FACTORS FREQUENCY  PT (By licensed PT), OT (By licensed OT)     PT Frequency:  (5x week) OT Frequency:  (5x week)            Contractures Contractures Info: Not present    Additional Factors Info  Code Status, Allergies Code Status Info:  (Full) Allergies Info:  (NKA)           Current Medications (04/08/2021):  This is the current hospital active medication list Current Facility-Administered Medications  Medication Dose Route Frequency Provider Last Rate Last Admin   acetaminophen (TYLENOL) suppository 650 mg  650 mg Rectal Q6H PRN Georgette Shell, MD   650 mg at 04/06/21 1630   albuterol (PROVENTIL) (2.5 MG/3ML) 0.083% nebulizer solution 2.5 mg  2.5 mg Nebulization Q4H PRN Georgette Shell, MD       azithromycin [facility]) 500 mg in sodium chloride 0.9 % 250 mL IVPB  500 mg Intravenous Daily Georgette Shell, MD   Stopped at 04/07/21 1423   carvedilol (COREG) tablet 25 mg  25 mg Oral BID WC Georgette Shell, MD   25 mg at 04/08/21 0917   cefTRIAXone (ROCEPHIN)  1 g in sodium chloride 0.9 % 100 mL IVPB  1 g Intravenous Daily Georgette Shell, MD 200 mL/hr at 04/08/21 0941 1 g at 04/08/21 0941   dexamethasone (DECADRON) injection 4 mg  4 mg Intravenous Q12H Georgette Shell, MD   4 mg at 04/08/21 0626   insulin aspart (novoLOG) injection 0-6 Units  0-6 Units Subcutaneous TID WC Georgette Shell, MD   1 Units at 04/08/21 0916   insulin glargine-yfgn [facility]) injection 4 Units  4 Units Subcutaneous QHS Georgette Shell, MD   4 Units at 04/07/21 2150   levothyroxine (SYNTHROID) tablet 50 mcg  50 mcg Oral Q0600 Georgette Shell, MD   50 mcg at 04/08/21 9485   MEDLINE mouth rinse  15 mL Mouth Rinse BID Georgette Shell, MD   15 mL at 04/07/21 2150   molnupiravir EUA (LAGEVRIO) capsule 800 mg  4 capsule Oral BID Georgette Shell, MD   800 mg at 04/08/21 4627   Rivaroxaban (XARELTO) tablet 15 mg  15 mg Oral Q supper Georgette Shell, MD   15 mg at 04/07/21 1832     Discharge Medications: Please see discharge summary for a list of discharge medications.  Relevant Imaging Results:  Relevant Lab Results:   Additional Information SSN: [id number]  Dessa Phi, RN

## 2021-04-08 NOTE — Evaluation (Signed)
Occupational Therapy Evaluation Patient Details Name: Sheila Morgan MRN: [id number] DOB: [birth year] Today's Date: 04/08/2021   History of Present Illness 87 yo female admitted with confusion, sepsis, COVID. Hx of CAD, OA, gout, hearing loss, R shoulder radiculopathy, DM, CABG, shingles, MI, falls   Clinical Impression   Patient is currently requiring assistance with ADLs including total assist with toileting with baseline(?) urinary and bowel incontinence, moderate to maximum assist with LE dressing while standing, moderate assist with bathing, and full setup and close supervision to Min guard assist with UE dressing, UB bathing, grooming and eating.  Current level of function is below patient's typical baseline, however no family available to confirm pts PLOF and pt with memory impairments. During this evaluation, patient was limited by generalized weakness, impaired activity tolerance, and cognitive impairments which may be baseline, all of which has the potential to impact patient's safety and independence during functional mobility, as well as performance for ADLs.  Patient states that she lives alone and chart review reports that family keeps pt on a monitor.  Pt reports PRN from a "girl" ~2 visits per week. Pt unable to give more information on this.   Pt currently requires close 24/7 care.  Patient demonstrates good rehab potential, and should benefit from continued skilled occupational therapy services while in acute care to maximize safety, independence and quality of life at home.  Continued occupational therapy services in a SNF setting prior to return home is recommended.  ?    Recommendations for follow up therapy are one component of a multi-disciplinary discharge planning process, led by the attending physician.  Recommendations may be updated based on patient status, additional functional criteria and insurance authorization.   Follow Up Recommendations  Skilled nursing-short term  rehab (<3 hours/day)    Assistance Recommended at Discharge Frequent or constant Supervision/Assistance  Functional Status Assessment  Patient has had a recent decline in their functional status and demonstrates the ability to make significant improvements in function in a reasonable and predictable amount of time.  Equipment Recommendations  BSC/3in1    Recommendations for Other Services       Precautions / Restrictions Precautions Precautions: Fall Precaution Comments: monitor HR and O2 Restrictions Weight Bearing Restrictions: No      Mobility Bed Mobility Overal bed mobility: Needs Assistance Bed Mobility: Supine to Sit     Supine to sit: Min guard;HOB elevated     General bed mobility comments: Min guard for safety. Increased time. Cues provided.    Transfers Overall transfer level: Needs assistance Equipment used: Rolling walker (2 wheels) Transfers: Sit to/from Stand;Bed to chair/wheelchair/BSC Sit to Stand: Min assist     Step pivot transfers: Min assist     General transfer comment: Assist to rise, steady, control descent. Cues for safety, management of RW, technique, hand placement.      Balance Overall balance assessment: Needs assistance;History of Falls Sitting-balance support: Feet supported;No upper extremity supported Sitting balance-Leahy Scale: Fair Sitting balance - Comments: Good static, fair dynamic   Standing balance support: Bilateral upper extremity supported;Reliant on assistive device for balance Standing balance-Leahy Scale: Poor                             ADL either performed or assessed with clinical judgement   ADL Overall ADL's : Needs assistance/impaired Eating/Feeding: Set up;Sitting   Grooming: Wash/dry hands;Sitting;Set up   Upper Body Bathing: Minimal assistance;Sitting   Lower Body Bathing: Maximal  assistance;Sit to/from stand;Sitting/lateral leans;Bed level   Upper Body Dressing : Minimal  assistance;Sitting   Lower Body Dressing: Min guard;Sitting/lateral leans;Sit to/from stand;Maximal assistance Lower Body Dressing Details (indicate cue type and reason): Mod As standing.  Min guard assist and increased time/effort sitting in recliner and leaning forweard to doff and don socks.  Pt reported effort as "very hard work" or 8/10 RPE Toilet Transfer: Minimal assistance;Rolling walker (2 wheels) Armed forces technical officer Details (indicate cue type and reason): to recliner. Stooped in standing. Toileting- Clothing Manipulation and Hygiene: Total assistance;Sit to/from stand Toileting - Clothing Manipulation Details (indicate cue type and reason): Pt reports baseline incontinence. Pt found to be soiled once mobilizing out of bed. Pt abnle to stand and grip RW and was offered warm washcloth but pt asked OT to perform all peri care with Total Assist. Using purewick for bladder incontinence.             Vision Baseline Vision/History: 1 Wears glasses (reading only) Additional Comments: Pt read clock with incorrect hour but correct minute.     Perception     Praxis      Pertinent Vitals/Pain Pain Assessment: No/denies pain Faces Pain Scale: No hurt     Hand Dominance Left   Extremity/Trunk Assessment Upper Extremity Assessment Upper Extremity Assessment: RUE deficits/detail;Generalized weakness RUE Deficits / Details: Shoulder ROM limited to ~70* AROM, ~115* PROM. Elbow-->Grip: WFL with hand OA looking deformities. RUE Sensation: WNL RUE Coordination: decreased fine motor   Lower Extremity Assessment Lower Extremity Assessment: Generalized weakness   Cervical / Trunk Assessment Cervical / Trunk Assessment: Normal   Communication Communication Communication: HOH   Cognition Arousal/Alertness: Awake/alert Behavior During Therapy: WFL for tasks assessed/performed Overall Cognitive Status: No family/caregiver present to determine baseline cognitive functioning Area of  Impairment: Memory;Problem solving;Safety/judgement;Orientation                 Orientation Level: Time;Situation   Memory: Decreased short-term memory   Safety/Judgement: Decreased awareness of safety   Problem Solving: Requires verbal cues       General Comments       Exercises     Shoulder Instructions      Home Living Family/patient expects to be discharged to:: Skilled nursing facility   Available Help at Discharge: Family;Available PRN/intermittently;Friend(s) Type of Home: House Home Access: Level entry     Home Layout: Bed/bath upstairs               Home Equipment: Conservation officer, nature (2 wheels);Cane - single point;Shower seat;Hand held shower head;Transport chair   Additional Comments: has stair lift      Prior Functioning/Environment Prior Level of Function : Needs assist             Mobility Comments: pt stated she has "someone that comes out 2x/week...helps on stairs"  Pt is unsure whether she is paid.  Pt reports that her daughter would know more. ADLs Comments: performs bathing, dressing unassisted        OT Problem List: Decreased strength;Cardiopulmonary status limiting activity;Decreased cognition;Decreased activity tolerance;Decreased safety awareness;Impaired balance (sitting and/or standing);Decreased knowledge of use of DME or AE;Decreased knowledge of precautions;Impaired UE functional use      OT Treatment/Interventions: Self-care/ADL training;Therapeutic exercise;Therapeutic activities;Cognitive remediation/compensation;Energy conservation;Patient/family education;DME and/or AE instruction;Balance training    OT Goals(Current goals can be found in the care plan section) Acute Rehab OT Goals Patient Stated Goal: Pt agreeable with recommendations for SNF prior to home. OT Goal Formulation: With patient Time For Goal Achievement: 04/22/21  Potential to Achieve Goals: Good ADL Goals Pt Will Perform Grooming: standing;with  supervision (at least 2 tasks with VSS) Pt Will Perform Lower Body Dressing: with supervision;sit to/from stand Pt Will Transfer to Toilet: with supervision;ambulating Pt Will Perform Toileting - Clothing Manipulation and hygiene: with supervision;with caregiver independent in assisting;with adaptive equipment (Educate family on bidet options to assist with hygiene.) Additional ADL Goal #1: Pt will perform any 2 out of bed ADLs with RPE no greater than 5/10 to indicate improved activity tolerance. Additional ADL Goal #2: Patient Regis Bill will identify at least 3 energy conservation strategies to employ at home in order to maximize function and quality of life and decrease caregiver burden while preventing exacerbation of symptoms and rehospitalization.  OT Frequency: Min 2X/week   Barriers to D/C: Decreased caregiver support          Co-evaluation              AM-PAC OT "6 Clicks" Daily Activity     Outcome Measure Help from another person eating meals?: A Little Help from another person taking care of personal grooming?: A Little Help from another person toileting, which includes using toliet, bedpan, or urinal?: Total Help from another person bathing (including washing, rinsing, drying)?: A Lot Help from another person to put on and taking off regular upper body clothing?: A Little Help from another person to put on and taking off regular lower body clothing?: A Lot 6 Click Score: 14   End of Session Equipment Utilized During Treatment: Gait belt;Rolling walker (2 wheels) Nurse Communication: Mobility status;Other (comment) (Need of new purewick)  Activity Tolerance: Patient tolerated treatment well Patient left: in chair;with call bell/phone within reach;with chair alarm set;with SCD's reapplied  OT Visit Diagnosis: Unsteadiness on feet (R26.81);Other symptoms and signs involving cognitive function;History of falling (Z91.81);Muscle weakness (generalized) (M62.81)                 Time: 7616-0737 OT Time Calculation (min): 29 min Charges:  OT General Charges $OT Visit: 1 Visit OT Evaluation $OT Eval Low Complexity: 1 Low OT Treatments $Self Care/Home Management : 8-22 mins  Anderson Malta, OT Acute Rehab Services Office: [phone] 04/08/2021  Julien Girt 04/08/2021, 1:43 PM

## 2021-04-08 NOTE — Progress Notes (Signed)
PHARMACIST - PHYSICIAN COMMUNICATION  CONCERNING: Antibiotic IV to Oral Route Change Policy  RECOMMENDATION: This patient is receiving azithromycin by the intravenous route.  Based on criteria approved by the Pharmacy and Therapeutics Committee, the antibiotic(s) is/are being converted to the equivalent oral dose form(s).   DESCRIPTION: These criteria include:  Patient being treated for a respiratory tract infection, urinary tract infection, cellulitis or clostridium difficile associated diarrhea if on metronidazole  The patient is not neutropenic and does not exhibit a GI malabsorption state  The patient is eating (either orally or via tube) and/or has been taking other orally administered medications for a least 24 hours  The patient is improving clinically and has a Tmax < 100.5  If you have questions about this conversion, please contact the Pharmacy Department  []  ( [phone] )  [place] []  ( [phone] )  [facility] []  ( [phone] )  [place] []  ( [phone] )  [facility] [x]  ( [phone] )  [facility]  

## 2021-04-09 DIAGNOSIS — U071 COVID-19: Secondary | ICD-10-CM | POA: Diagnosis not present

## 2021-04-09 LAB — CBC WITH DIFFERENTIAL/PLATELET
Abs Immature Granulocytes: 0.05 10*3/uL (ref 0.00–0.07)
Basophils Absolute: 0 10*3/uL (ref 0.0–0.1)
Basophils Relative: 0 %
Eosinophils Absolute: 0 10*3/uL (ref 0.0–0.5)
Eosinophils Relative: 0 %
HCT: 35.4 % — ABNORMAL LOW (ref 36.0–46.0)
Hemoglobin: 11.1 g/dL — ABNORMAL LOW (ref 12.0–15.0)
Immature Granulocytes: 1 %
Lymphocytes Relative: 7 %
Lymphs Abs: 0.5 10*3/uL — ABNORMAL LOW (ref 0.7–4.0)
MCH: 29.8 pg (ref 26.0–34.0)
MCHC: 31.4 g/dL (ref 30.0–36.0)
MCV: 95.2 fL (ref 80.0–100.0)
Monocytes Absolute: 0.3 10*3/uL (ref 0.1–1.0)
Monocytes Relative: 4 %
Neutro Abs: 6.9 10*3/uL (ref 1.7–7.7)
Neutrophils Relative %: 88 %
Platelets: 164 10*3/uL (ref 150–400)
RBC: 3.72 MIL/uL — ABNORMAL LOW (ref 3.87–5.11)
RDW: 13.8 % (ref 11.5–15.5)
WBC: 7.8 10*3/uL (ref 4.0–10.5)
nRBC: 0.4 % — ABNORMAL HIGH (ref 0.0–0.2)

## 2021-04-09 LAB — BASIC METABOLIC PANEL
Anion gap: 9 (ref 5–15)
BUN: 34 mg/dL — ABNORMAL HIGH (ref 8–23)
CO2: 23 mmol/L (ref 22–32)
Calcium: 9.1 mg/dL (ref 8.9–10.3)
Chloride: 108 mmol/L (ref 98–111)
Creatinine, Ser: 1.09 mg/dL — ABNORMAL HIGH (ref 0.44–1.00)
GFR, Estimated: 49 mL/min — ABNORMAL LOW (ref >60)
Glucose, Bld: 230 mg/dL — ABNORMAL HIGH (ref 70–99)
Potassium: 3.8 mmol/L (ref 3.5–5.1)
Sodium: 140 mmol/L (ref 135–145)

## 2021-04-09 LAB — GLUCOSE, CAPILLARY
Glucose-Capillary: 199 mg/dL — ABNORMAL HIGH (ref 70–99)
Glucose-Capillary: 300 mg/dL — ABNORMAL HIGH (ref 70–99)
Glucose-Capillary: 248 mg/dL — ABNORMAL HIGH (ref 70–99)
Glucose-Capillary: 368 mg/dL — ABNORMAL HIGH (ref 70–99)

## 2021-04-09 MED ORDER — GLUCERNA SHAKE PO LIQD
237.0000 mL | Freq: Two times a day (BID) | ORAL | Status: DC
  Administered 2021-04-09 – 2021-04-17 (×15): 237 mL via ORAL
  Filled 2021-04-09 (×17): qty 237

## 2021-04-09 MED ORDER — INSULIN ASPART 100 UNIT/ML IJ SOLN
0.0000 [IU] | Freq: Every day | INTRAMUSCULAR | Status: DC
  Administered 2021-04-09: 5 [IU] via SUBCUTANEOUS
  Administered 2021-04-10: 2 [IU] via SUBCUTANEOUS
  Administered 2021-04-11: 4 [IU] via SUBCUTANEOUS
  Administered 2021-04-13: 2 [IU] via SUBCUTANEOUS

## 2021-04-09 MED ORDER — ENSURE MAX PROTEIN PO LIQD
11.0000 [oz_av] | Freq: Every day | ORAL | Status: DC
Start: 2021-04-09 — End: 2021-04-17
  Administered 2021-04-09 – 2021-04-16 (×5): 11 [oz_av] via ORAL
  Filled 2021-04-09 (×9): qty 330

## 2021-04-09 MED ORDER — INSULIN GLARGINE-YFGN 100 UNIT/ML ~~LOC~~ SOLN
10.0000 [IU] | Freq: Every day | SUBCUTANEOUS | Status: DC
  Administered 2021-04-09: 10 [IU] via SUBCUTANEOUS
  Filled 2021-04-09: qty 0.1

## 2021-04-09 MED ORDER — INSULIN ASPART 100 UNIT/ML IJ SOLN
0.0000 [IU] | Freq: Three times a day (TID) | INTRAMUSCULAR | Status: DC
Start: 2021-04-10 — End: 2021-04-16
  Administered 2021-04-10: 3 [IU] via SUBCUTANEOUS
  Administered 2021-04-10: 8 [IU] via SUBCUTANEOUS
  Administered 2021-04-10: 3 [IU] via SUBCUTANEOUS
  Administered 2021-04-11 (×2): 5 [IU] via SUBCUTANEOUS
  Administered 2021-04-12: 3 [IU] via SUBCUTANEOUS
  Administered 2021-04-12 (×2): 5 [IU] via SUBCUTANEOUS
  Administered 2021-04-13: 3 [IU] via SUBCUTANEOUS
  Administered 2021-04-13: 8 [IU] via SUBCUTANEOUS
  Administered 2021-04-13: 5 [IU] via SUBCUTANEOUS
  Administered 2021-04-14 (×2): 3 [IU] via SUBCUTANEOUS
  Administered 2021-04-14: 8 [IU] via SUBCUTANEOUS
  Administered 2021-04-15: 5 [IU] via SUBCUTANEOUS
  Administered 2021-04-15: 2 [IU] via SUBCUTANEOUS

## 2021-04-09 MED ORDER — ADULT MULTIVITAMIN W/MINERALS CH
1.0000 | ORAL_TABLET | Freq: Every day | ORAL | Status: DC
Start: 2021-04-09 — End: 2021-04-17
  Administered 2021-04-09 – 2021-04-17 (×9): 1 via ORAL
  Filled 2021-04-09 (×9): qty 1

## 2021-04-09 NOTE — Progress Notes (Addendum)
Physical Therapy Treatment Patient Details Name: Sheila Morgan MRN: [id number] DOB: [birth year] Today's Date: 04/09/2021   History of Present Illness 87 yo female admitted with confusion, sepsis, COVID. Hx of CAD, OA, gout, hearing loss, R shoulder radiculopathy, DM, CABG, shingles, MI, falls    PT Comments    Deferred OOB mobility this session. Pt tele monitor showing fluctuating HR 90s-160s with pt lying in bed. Assisted pt with scooting up in bed to have lunch. Attempted to make RN (christina) aware but unit secretary reported RN was off floor. RN Janett Billow) came to door so I made her aware. Will continue to follow and progress activity as able.     Recommendations for follow up therapy are one component of a multi-disciplinary discharge planning process, led by the attending physician.  Recommendations may be updated based on patient status, additional functional criteria and insurance authorization.  Follow Up Recommendations  Skilled nursing-short term rehab (<3 hours/day)     Assistance Recommended at Discharge    Equipment Recommendations  None recommended by PT    Recommendations for Other Services       Precautions / Restrictions Precautions Precautions: Fall Precaution Comments: monitor HR and O2 Restrictions Weight Bearing Restrictions: No     Mobility  Bed Mobility               General bed mobility comments: NT- upon entry, tele monitor with fluctuating HR 90s-160s lying in bed. Deferred OOB. Assisted pt with scooting up in bed to have lunch.    Transfers                        Ambulation/Gait                   Stairs             Wheelchair Mobility    Modified Rankin (Stroke Patients Only)       Balance                                            Cognition Arousal/Alertness: Awake/alert Behavior During Therapy: WFL for tasks assessed/performed Overall Cognitive Status: History of cognitive  impairments - at baseline Area of Impairment: Memory;Problem solving                     Memory: Decreased short-term memory       Problem Solving: Requires verbal cues          Exercises      General Comments        Pertinent Vitals/Pain Pain Assessment: No/denies pain    Home Living                          Prior Function            PT Goals (current goals can now be found in the care plan section)      Frequency    Min 3X/week      PT Plan Current plan remains appropriate    Co-evaluation              AM-PAC PT "6 Clicks" Mobility   Outcome Measure                   End of Session     Patient  left: in bed;with call bell/phone within reach;with family/visitor present   PT Visit Diagnosis: History of falling (Z91.81);Unsteadiness on feet (R26.81);Muscle weakness (generalized) (M62.81);Difficulty in walking, not elsewhere classified (R26.2)     Time: 1641-1700 PT Time Calculation (min) (ACUTE ONLY): 19 min  Charges:  $Therapeutic Activity: 8-22 mins                        Doreatha Massed, PT Acute Rehabilitation  Office: [phone] Pager: [phone]

## 2021-04-09 NOTE — Progress Notes (Signed)
PROGRESS NOTE    Sheila Morgan  JAS:[id number] DOB: Apr 14, 1933 DOA: 04/05/2021 PCP: Binnie Rail, MD   Brief Narrative: Sheila Morgan is a 87 y.o. female with medical history significant of type 2 diabetes, hypothyroidism, hypertension, CAD, CABG, prior history of COVID has taken COVID vaccinations and monoclonal antibodies family called EMS as patient was acting different.  Family watches her through the camera and noticed that she was acting different and confused.  When they went to check on her they found her very weak and confused with fever. Complained of cough. Family thought she had foul-smelling urine and increased urinary frequency. PO intake has been poor with decreased appetite. History was obtained from the patient's daughter as patient was confused and unable to provide any relevant information.    Assessment & Plan:   Principal Problem:   COVID   Sepsis present on admission due to COVID PNA On admission patient was tachypneic and tachycardic with leukocytosis and fever of 101.2 Chest x-ray unremarkable UA unremarkable for UTI BC x2 NGTD Continue azithromycin, Rocephin Continue Paxlovid Continue Decadron, nebulizers, inhalers, cough suppressants as needed  History of type 2 diabetes mellitus Hypoglycemia worsened by steroid use Hemoglobin A1c 7.2 SSI, glargine   History of hypertension Continue coreg Hold Norvasc and Imdur at home restart as needed  Hx of Afib Rate uncontrolled Continue coreg and xarelto  Hyperlipidemia on Zocor   Hypothyroidism on Synthroid   DVT on xarelto   Goals of care  Patient is full code Seen by physical therapy recommending SNF.  Estimated body mass index is 26.07 kg/m as calculated from the following:   Height as of this encounter: 5\' 4"  (1.626 m).   Weight as of this encounter: 68.9 kg.  DVT prophylaxis: Xarelto Code Status: Full code Family Communication: Discussed with daughter at bedside Disposition Plan:   Status is: Inpatient  Remains inpatient appropriate because: IV antibiotics    Consultants: None  Procedures: None Antimicrobials: Rocephin azithromycin and PaxLOVID  Subjective Patient eating lunch, denies any new complaints.  Daughter reports significant improvement  Objective: Vitals:   04/09/21 0456 04/09/21 0859 04/09/21 0912 04/09/21 1123  BP: (!) 131/96 (!) 116/92  102/87  Pulse: (!) 118 67  88  Resp: 20 (!) 23  (!) 23  Temp: 97.6 F (36.4 C) 97.6 F (36.4 C)  97.9 F (36.6 C)  TempSrc: Oral Oral  Oral  SpO2: 97%  100% 92%  Weight:      Height:        Intake/Output Summary (Last 24 hours) at 04/09/2021 1939 Last data filed at 04/09/2021 1835 Gross per 24 hour  Intake 240 ml  Output 600 ml  Net -360 ml   Filed Weights   04/05/21 1531 04/05/21 2026  Weight: 71.2 kg 68.9 kg    Examination: General: NAD  Cardiovascular: S1, S2 present Respiratory: CTAB Abdomen: Soft, nontender, nondistended, bowel sounds present Musculoskeletal: No bilateral pedal edema noted Skin: Normal Psychiatry: Normal mood     Data Reviewed: I have personally reviewed following labs and imaging studies  CBC: Recent Labs  Lab 04/05/21 1303 04/06/21 0528 04/07/21 0146 04/08/21 0910 04/09/21 0751  WBC 13.2* 8.7 6.7 10.5 7.8  NEUTROABS 11.5*  --  5.8  --  6.9  HGB 12.9 11.6* 12.4 12.0 11.1*  HCT 38.8 35.4* 38.1 36.0 35.4*  MCV 92.2 91.9 93.6 91.4 95.2  PLT 164 133* 129* 148* 419   Basic Metabolic Panel: Recent Labs  Lab 04/05/21 1303 04/06/21 0528  04/07/21 0146 04/08/21 0910 04/09/21 0751  NA 137 135 139 140 140  K 3.6 3.1* 4.0 3.7 3.8  CL 99 104 107 110 108  CO2 27 25 23  21* 23  GLUCOSE 167* 119* 228* 200* 230*  BUN 11 11 14 22  34*  CREATININE 1.13* 1.04* 1.16* 1.05* 1.09*  CALCIUM 10.7* 9.0 8.7* 8.8* 9.1   GFR: Estimated Creatinine Clearance: 35.3 mL/min (A) (by C-G formula based on SCr of 1.09 mg/dL (H)). Liver Function Tests: Recent Labs  Lab  04/05/21 1303 04/06/21 0528 04/07/21 0146 04/08/21 0910  AST 26 34 38 27  ALT 18 17 21 21   ALKPHOS 71 57 59 48  BILITOT 1.0 1.0 0.7 0.5  PROT 7.3 5.9* 6.6 6.4*  ALBUMIN 4.0 3.1* 3.0* 3.0*   No results for input(s): LIPASE, AMYLASE in the last 168 hours. Recent Labs  Lab 04/07/21 0146  AMMONIA 41*   Coagulation Profile: Recent Labs  Lab 04/05/21 1303 04/06/21 0528  INR 2.0* 3.1*   Cardiac Enzymes: No results for input(s): CKTOTAL, CKMB, CKMBINDEX, TROPONINI in the last 168 hours. BNP (last 3 results) No results for input(s): PROBNP in the last 8760 hours. HbA1C: No results for input(s): HGBA1C in the last 72 hours.  CBG: Recent Labs  Lab 04/08/21 1615 04/08/21 2129 04/09/21 0744 04/09/21 1120 04/09/21 1739  GLUCAP 278* 284* 199* 300* 248*   Lipid Profile: No results for input(s): CHOL, HDL, LDLCALC, TRIG, CHOLHDL, LDLDIRECT in the last 72 hours. Thyroid Function Tests: No results for input(s): TSH, T4TOTAL, FREET4, T3FREE, THYROIDAB in the last 72 hours. Anemia Panel: No results for input(s): VITAMINB12, FOLATE, FERRITIN, TIBC, IRON, RETICCTPCT in the last 72 hours. Sepsis Labs: Recent Labs  Lab 04/05/21 1733 04/05/21 1828 04/05/21 2140 04/06/21 0528 04/06/21 1437 04/06/21 1645 04/07/21 0146  PROCALCITON 0.22  --   --  0.87  --   --  0.69  LATICACIDVEN  --    < > 2.9*  --  1.1 1.9 1.9   < > = values in this interval not displayed.    Recent Results (from the past 240 hour(s))  Blood Culture (routine x 2)     Status: None (Preliminary result)   Collection Time: 04/05/21  1:03 PM   Specimen: BLOOD  Result Value Ref Range Status   Specimen Description   Final    BLOOD BLOOD RIGHT FOREARM Performed at [facility] [address], [place], [place] [address]    Special Requests   Final    BOTTLES DRAWN AEROBIC AND ANAEROBIC Blood Culture results may not be optimal due to an inadequate volume of blood received in culture  bottles Performed at [facility] [address], [place], [place] [address]    Culture   Final    NO GROWTH 4 DAYS Performed at [facility], [place] [address], [place], [place] [address]    Report Status PENDING  Incomplete  Blood Culture (routine x 2)     Status: None (Preliminary result)   Collection Time: 04/05/21  1:03 PM   Specimen: BLOOD  Result Value Ref Range Status   Specimen Description   Final    BLOOD BLOOD LEFT WRIST Performed at [facility] [address] [place], [place] [address]    Special Requests   Final    BOTTLES DRAWN AEROBIC AND ANAEROBIC Blood Culture adequate volume Performed at [facility] [address] [place], [place] [address]  Culture   Final    NO GROWTH 4 DAYS Performed at [facility], [place] [address] [place], [place] [address]    Report Status PENDING  Incomplete  Resp Panel by RT-PCR (Flu A&B, Covid)     Status: Abnormal   Collection Time: 04/05/21  1:03 PM   Specimen: Nasopharyngeal(NP) swabs in vial transport medium  Result Value Ref Range Status   SARS Coronavirus 2 by RT PCR POSITIVE (A) NEGATIVE Final    Comment: RESULT CALLED TO, READ BACK BY AND VERIFIED WITH: GARRISON G. RN ON 04/05/2021 @ 1556 BY MECIAL J. (NOTE) SARS-CoV-2 target nucleic acids are DETECTED.  The SARS-CoV-2 RNA is generally detectable in upper respiratory specimens during the acute phase of infection. Positive results are indicative of the presence of the identified virus, but do not rule out bacterial infection or co-infection with other pathogens not detected by the test. Clinical correlation with patient history and other diagnostic information is necessary to determine patient infection status. The expected result is Negative.  Fact Sheet for Patients: EntrepreneurPulse.com.au  Fact Sheet for Healthcare Providers: IncredibleEmployment.be  This  test is not yet approved or cleared by the [place] FDA and  has been authorized for detection and/or diagnosis of SARS-CoV-2 by FDA under an Emergency Use Authorization (EUA).  This EUA will remain in effect (meaning t his test can be used) for the duration of  the COVID-19 declaration under Section 564(b)(1) of the Act, 21 U.S.C. section 360bbb-3(b)(1), unless the authorization is terminated or revoked sooner.     Influenza A by PCR NEGATIVE NEGATIVE Final   Influenza B by PCR NEGATIVE NEGATIVE Final    Comment: (NOTE) The Xpert Xpress SARS-CoV-2/FLU/RSV plus assay is intended as an aid in the diagnosis of influenza from Nasopharyngeal swab specimens and should not be used as a sole basis for treatment. Nasal washings and aspirates are unacceptable for Xpert Xpress SARS-CoV-2/FLU/RSV testing.  Fact Sheet for Patients: EntrepreneurPulse.com.au  Fact Sheet for Healthcare Providers: IncredibleEmployment.be  This test is not yet approved or cleared by the [place] FDA and has been authorized for detection and/or diagnosis of SARS-CoV-2 by FDA under an Emergency Use Authorization (EUA). This EUA will remain in effect (meaning this test can be used) for the duration of the COVID-19 declaration under Section 564(b)(1) of the Act, 21 U.S.C. section 360bbb-3(b)(1), unless the authorization is terminated or revoked.  Performed at [facility], [place] [address], [place], [place] [address]   Urine Culture     Status: Abnormal   Collection Time: 04/05/21  4:02 PM   Specimen: In/Out Cath Urine  Result Value Ref Range Status   Specimen Description   Final    IN/OUT CATH URINE Performed at [facility] [address], [place], [place] [address]    Special Requests   Final    NONE Performed at [facility], [place] [address], [place], [place] [address]    Culture MULTIPLE SPECIES PRESENT,  SUGGEST RECOLLECTION (A)  Final   Report Status 04/07/2021 FINAL  Final  MRSA Next Gen by PCR, Nasal     Status: None   Collection Time: 04/06/21  4:54 PM   Specimen: Nasal Mucosa; Nasal Swab  Result Value Ref Range Status   MRSA by PCR Next Gen NOT DETECTED NOT DETECTED Final    Comment: (NOTE) The GeneXpert MRSA Assay (FDA approved for NASAL specimens only), is one component of a comprehensive MRSA colonization surveillance program. It  is not intended to diagnose MRSA infection nor to guide or monitor treatment for MRSA infections. Test performance is not FDA approved in patients less than 22 years old. Performed at [facility], [place] [address] [place], [place] [address]           Radiology Studies: No results found.      Scheduled Meds:  azithromycin  500 mg Oral Daily   carvedilol  25 mg Oral BID WC   dexamethasone  6 mg Oral Daily   feeding supplement (GLUCERNA SHAKE)  237 mL Oral BID BM   insulin aspart  0-6 Units Subcutaneous TID WC   insulin glargine-yfgn  4 Units Subcutaneous QHS   levothyroxine  50 mcg Oral Q0600   mouth rinse  15 mL Mouth Rinse BID   molnupiravir EUA  4 capsule Oral BID   multivitamin with minerals  1 tablet Oral Daily   Ensure Max Protein  11 oz Oral Daily   rivaroxaban  15 mg Oral Q supper   Continuous Infusions:  cefTRIAXone (ROCEPHIN)  IV 1 g (04/09/21 1246)     LOS: 4 days   Alma Friendly, MD 04/09/2021, 7:39 PM

## 2021-04-09 NOTE — TOC Progression Note (Addendum)
Transition of Care [facility]) - Progression Note    Patient Details  Name: Sheila Morgan MRN: [id number] Date of Birth: 24-Mar-1933  Transition of Care [facility]) CM/SW Contact  Cherica Heiden, Juliann Pulse, RN Phone Number: 04/09/2021, 9:38 AM  Clinical Narrative:  Bed offers to be given to dtr Sharon;spoke to son Sammy-informed of bed offers.Must quarantine 10days.Await choice.  12:27p-Dtr [name] chose [employer] aware.COVID+,must quarantine 10days with no symptoms prior d/c-adm 12/3-12/13 10days.   1. 1.3 mi [facility] [facility] [place], [place] [address] [phone] Overall rating Above average 2. 1.6 mi [facility] [facility] [place], [place] [address] [phone] Overall rating Much below average 3. 2.1 mi [place], [place] [address] [phone] Overall rating Much below average 4. 2.5 mi [facility] at [place], [place] [address] [phone] Overall rating Below average 5. 2.8 mi [facility] [place], [place] [address] [phone] Overall rating Average 6. 2.8 mi [place] [address] [place], [place] [address] [phone] Overall rating Much below average 7. 3 mi [facility] [place], [place] [address] [phone] Overall rating Above average 8. 3.6 [place] [address] [place], [place] [address] [phone] Overall rating Average 9. 3.6 mi [facility] [address] [place], [place] [address] [phone] Overall rating Much below average 10. 3.9 mi [facility] [place], [place] [address] [phone] Overall rating Much below  average 11. 4.4 mi [facility] at [place], [place] [address] [phone] Overall rating Much above average 12. 4.6 mi [facility] [address] [place], [place] [address] [phone] Overall rating Much above average 13. 5.5 mi [facility] [address] [place], [place] [address] [phone] Overall rating Above average 14. 8.2 [facility] [place], [place] [address] [phone] Overall rating Much above average 15. 9 mi [facility] [address] [place], [place] [address] [phone] Overall rating Below average 16. 9.1 [place] and [place], [place] [address] [phone] Overall rating Much below average 17. 9.2 mi [facility] [address] [place], [place] [address] [phone] Overall rating Much above average 18. 10.8 mi [place] [facility] [address] [place], [place] [address] [phone] Overall rating Much above average 19. 12.6 mi [facility] [address] [place], [place] [address] [phone] Overall rating Much below average 20. 12.8 [facility] [place], [place] [address] [phone] Overall rating Much below average 21. 14.2 mi [facility] [place] [address] [place], [place] [address] [phone] Overall rating Much below average 22. 14.4 mi [facility] [place], [place] [address] [phone] Overall rating Above average 23. 14.8 [place], [place] [address] [phone] Overall rating Much above average 24. 14.9 [place] [address] [place], [place] [address] [phone] Overall rating Much below average 25. 16.5 mi [facility] [address] [place],  [address] [phone] Overall rating Average 26. 16.7 mi [facility] [place], [place] [address] [phone] Overall rating Above average 27. 17.9 mi [place], [place] [address] [phone] Overall rating Average 28. 85.4 [facility] [address] [place], [place] [address] [phone] Overall rating Much below average 29. 19.7 mi [facility] [address] [place], [place] [address] [phone] Overall rating Much below average 30. 20 [address] at [employer] [facility], [place] [address] [phone] Overall rating Much above average 31. 21.1 mi [place], [place] [address] [phone] Overall rating Much below average 32. 21.6 [address] [place], [place] [address] [phone] Overall rating Below average 33. 80.9 [facility] [address] [place], [place] [address] [phone] Overall rating Below average 34. 21.8 [place], [place] [address] [phone] Overall rating Above average 35. [address] [place], [place] [address] [phone] Overall rating Much above average 36. 22.6 mi [facility] [address] [place], [place] [address] [phone] Overall rating Average 37. 22.7 mi [facility] [address] [place], [place] [address] [phone] Overall rating Much below average 38. 23.3 mi [facility] [address] [place], [place] [address] [phone] Overall rating Above average 39. 23.5 [place], [place] [address] [phone] Overall rating Not available18 40. 24.1 mi [place] [address] [place], [place] [address] [phone] Overall rating Much below average 41. 24.2 mi [place] [address] [place], [place] [address] [phone] Overall rating Below average 42. 24.4 [facility] Care[facility], [place] [address] [phone] Overall rating Much below average 43. 24.5 mi [facility] [place],  [address] [phone] Overall rating Above average To explore and  Expected Discharge Plan: [place] Barriers to Discharge: Continued Medical Work up  Expected Discharge Plan and Services Expected Discharge Plan: [place]   Discharge Planning Services: CM Consult Post Acute Care Choice: [place] arrangements for the past 2 months: Single Family Home                                       Social Determinants of Health (SDOH) Interventions    Readmission Risk Interventions No flowsheet data found.

## 2021-04-09 NOTE — Discharge Instructions (Signed)

## 2021-04-09 NOTE — Progress Notes (Signed)
Initial Nutrition Assessment  DOCUMENTATION CODES:   Not applicable  INTERVENTION:  - will order Glucerna Shake BID each supplement provides 220 kcal and 10 grams of protein. - will order Ensure Max once/day, each supplement provides 150 kcal and 30 grams of protein. - will order 1 tablet multivitamin with minerals/day. - placed "Carbohydrate Counting for People with Diabetes" from the Academy of Nutrition and Dietetics in Discharge Instructions/AVS.    NUTRITION DIAGNOSIS:   Increased nutrient needs related to acute illness, catabolic illness (BHALP-37 infection) as evidenced by estimated needs.  GOAL:   Patient will meet greater than or equal to 90% of their needs  MONITOR:   PO intake, Supplement acceptance, Labs, Weight trends  REASON FOR ASSESSMENT:   Consult Diet education  ASSESSMENT:   87 y.o. female with medical history of type 2 DM, hypothyroidism, HTN, CAD, CABG, prior history of COVID has taken COVID vaccinations and monoclonal antibodies. Her family called EMS due to patient acting different than her normal and due to severe weakness. Family reported that she had a poor appetite PTA.  Patient laying in bed with no visitors present at the time of RD visit. Patient shares that her daughter was visiting earlier but that she went home and will return later in the day today.   Limited meal completion percentages documented since admission: 30% of lunch on 12/4; 25% of breakfast and 100% of lunch on 12/5; 50% of breakfast today.  She reports that she had a few bites each of a muffin (which is on bedside table and appears to have 2-3 bites missing), green beans, and mashed potatoes. She denies any chewing or swallowing difficulties with these items. She denies any abdominal pain or pressure or nausea with PO intakes.   She shares that she continues to have a decreased appetite that has been ongoing since she got sick; she is unsure of a more detailed time frame.   Flow  sheet documentation indicates that patient is a/o to self and place. She is hard of hearing during RD visit.   Weight on 12/3 was 152 lb and PTA the most recently documented weight was 157 lb on 12/31/20. This indicates 5 lb weight loss (3.2% body weight).    Labs reviewed; HgbA1c: 7.2% on 12/3, CBGs: 199 and 300 mg/dl, BUN: 34 mg/dl, creatinine: 1.09 mg/dl, GFR: 49 ml/min.  Medications reviewed; sliding scale novolog, 4 units semglee/day, 50 mcg oral synthroid/day.    NUTRITION - FOCUSED PHYSICAL EXAM:  Completed; no muscle or fat depletions, mild pitting edema to BLE.   Diet Order:   Diet Order             DIET DYS 3 Room service appropriate? Yes; Fluid consistency: Thin  Diet effective now                   EDUCATION NEEDS:   Not appropriate for education at this time  Skin:  Skin Assessment: Reviewed RN Assessment  Last BM:  12/7 (type 4)  Height:   Ht Readings from Last 1 Encounters:  04/05/21 5\' 4"  (1.626 m)    Weight:   Wt Readings from Last 1 Encounters:  04/05/21 68.9 kg     Estimated Nutritional Needs:  Kcal:  [address] kcal Protein:  85-100 grams Fluid:  >/= 2 L/day      Jarome Matin, MS, RD, LDN, CNSC Inpatient Clinical Dietitian RD pager # available in [facility]  After hours/weekend pager # available in [facility]

## 2021-04-09 NOTE — Progress Notes (Signed)
Inpatient Diabetes Program Recommendations  AACE/ADA: New Consensus Statement on Inpatient Glycemic Control (2015)  Target Ranges:  Prepandial:   less than 140 mg/dL      Peak postprandial:   less than 180 mg/dL (1-2 hours)      Critically ill patients:  140 - 180 mg/dL   Lab Results  Component Value Date   GLUCAP 300 (H) 04/09/2021   HGBA1C 7.2 (H) 04/05/2021    Review of Glycemic Control  Latest Reference Range & Units 04/08/21 08:06 04/08/21 11:16 04/08/21 16:15 04/08/21 21:29 04/09/21 07:44 04/09/21 11:20  Glucose-Capillary 70 - 99 mg/dL 191 (H) 206 (H) 278 (H) 284 (H) 199 (H) 300 (H)   Diabetes history: DM 2 Outpatient Diabetes medications: Metformin 250 with largest meal Current orders for Inpatient glycemic control:  Semglee 4 units qhs Novolog 0-6 units tid  Decadron 6 mg Daily  Inpatient Diabetes Program Recommendations:    -   Increase Semglee to 5 units -   Add Novolog 2 units tid meal coverage if eating >50% of meals  Thanks,  Tama Headings RN, MSN, BC-ADM Inpatient Diabetes Coordinator Team Pager [phone] (8a-5p)

## 2021-04-09 NOTE — Plan of Care (Signed)
  Problem: Education: Goal: Knowledge of risk factors and measures for prevention of condition will improve Outcome: Progressing   Problem: Coping: Goal: Psychosocial and spiritual needs will be supported Outcome: Progressing   Problem: Respiratory: Goal: Will maintain a patent airway Outcome: Progressing Goal: Complications related to the disease process, condition or treatment will be avoided or minimized Outcome: Progressing   Problem: Education: Goal: Knowledge of General Education information will improve Description: Including pain rating scale, medication(s)/side effects and non-pharmacologic comfort measures Outcome: Progressing   Problem: Health Behavior/Discharge Planning: Goal: Ability to manage health-related needs will improve Outcome: Progressing   Problem: Clinical Measurements: Goal: Will remain free from infection Outcome: Progressing Goal: Diagnostic test results will improve Outcome: Progressing Goal: Respiratory complications will improve Outcome: Progressing   Problem: Nutrition: Goal: Adequate nutrition will be maintained Outcome: Progressing   Problem: Coping: Goal: Level of anxiety will decrease Outcome: Progressing   Problem: Elimination: Goal: Will not experience complications related to bowel motility Outcome: Progressing Goal: Will not experience complications related to urinary retention Outcome: Progressing   Problem: Safety: Goal: Ability to remain free from injury will improve Outcome: Progressing   Problem: Skin Integrity: Goal: Risk for impaired skin integrity will decrease Outcome: Progressing   Problem: Clinical Measurements: Goal: Cardiovascular complication will be avoided Outcome: Not Progressing   Problem: Activity: Goal: Risk for activity intolerance will decrease Outcome: Not Progressing

## 2021-04-10 DIAGNOSIS — U071 COVID-19: Secondary | ICD-10-CM | POA: Diagnosis not present

## 2021-04-10 DIAGNOSIS — I480 Paroxysmal atrial fibrillation: Secondary | ICD-10-CM | POA: Diagnosis not present

## 2021-04-10 LAB — CBC WITH DIFFERENTIAL/PLATELET
Abs Immature Granulocytes: 0.13 10*3/uL — ABNORMAL HIGH (ref 0.00–0.07)
Basophils Absolute: 0 10*3/uL (ref 0.0–0.1)
Basophils Relative: 0 %
Eosinophils Absolute: 0 10*3/uL (ref 0.0–0.5)
Eosinophils Relative: 0 %
HCT: 35.1 % — ABNORMAL LOW (ref 36.0–46.0)
Hemoglobin: 11.8 g/dL — ABNORMAL LOW (ref 12.0–15.0)
Immature Granulocytes: 1 %
Lymphocytes Relative: 7 %
Lymphs Abs: 0.7 10*3/uL (ref 0.7–4.0)
MCH: 30.3 pg (ref 26.0–34.0)
MCHC: 33.6 g/dL (ref 30.0–36.0)
MCV: 90.2 fL (ref 80.0–100.0)
Monocytes Absolute: 0.4 10*3/uL (ref 0.1–1.0)
Monocytes Relative: 5 %
Neutro Abs: 8.1 10*3/uL — ABNORMAL HIGH (ref 1.7–7.7)
Neutrophils Relative %: 87 %
Platelets: 173 10*3/uL (ref 150–400)
RBC: 3.89 MIL/uL (ref 3.87–5.11)
RDW: 13.8 % (ref 11.5–15.5)
WBC: 9.3 10*3/uL (ref 4.0–10.5)
nRBC: 0 % (ref 0.0–0.2)

## 2021-04-10 LAB — CULTURE, BLOOD (ROUTINE X 2)
Culture: NO GROWTH
Culture: NO GROWTH
Special Requests: ADEQUATE

## 2021-04-10 LAB — GLUCOSE, CAPILLARY
Glucose-Capillary: 294 mg/dL — ABNORMAL HIGH (ref 70–99)
Glucose-Capillary: 176 mg/dL — ABNORMAL HIGH (ref 70–99)
Glucose-Capillary: 158 mg/dL — ABNORMAL HIGH (ref 70–99)
Glucose-Capillary: 246 mg/dL — ABNORMAL HIGH (ref 70–99)

## 2021-04-10 LAB — BASIC METABOLIC PANEL
Anion gap: 8 (ref 5–15)
BUN: 36 mg/dL — ABNORMAL HIGH (ref 8–23)
CO2: 21 mmol/L — ABNORMAL LOW (ref 22–32)
Calcium: 9.3 mg/dL (ref 8.9–10.3)
Chloride: 109 mmol/L (ref 98–111)
Creatinine, Ser: 1 mg/dL (ref 0.44–1.00)
GFR, Estimated: 55 mL/min — ABNORMAL LOW (ref >60)
Glucose, Bld: 251 mg/dL — ABNORMAL HIGH (ref 70–99)
Potassium: 3.6 mmol/L (ref 3.5–5.1)
Sodium: 138 mmol/L (ref 135–145)

## 2021-04-10 LAB — MAGNESIUM: Magnesium: 2.4 mg/dL (ref 1.7–2.4)

## 2021-04-10 MED ORDER — DILTIAZEM LOAD VIA INFUSION
10.0000 mg | Freq: Once | INTRAVENOUS | Status: AC
  Administered 2021-04-10: 10 mg via INTRAVENOUS
  Filled 2021-04-10: qty 10

## 2021-04-10 MED ORDER — CARVEDILOL 12.5 MG PO TABS
12.5000 mg | ORAL_TABLET | Freq: Once | ORAL | Status: AC
  Administered 2021-04-10: 12.5 mg via ORAL
  Filled 2021-04-10: qty 1

## 2021-04-10 MED ORDER — DILTIAZEM HCL-DEXTROSE 125-5 MG/125ML-% IV SOLN (PREMIX)
5.0000 mg/h | INTRAVENOUS | Status: DC
  Administered 2021-04-11 – 2021-04-13 (×7): 15 mg/h via INTRAVENOUS
  Filled 2021-04-10 (×10): qty 125

## 2021-04-10 MED ORDER — INSULIN GLARGINE-YFGN 100 UNIT/ML ~~LOC~~ SOLN
10.0000 [IU] | Freq: Two times a day (BID) | SUBCUTANEOUS | Status: DC
  Administered 2021-04-10 – 2021-04-15 (×12): 10 [IU] via SUBCUTANEOUS
  Filled 2021-04-10 (×13): qty 0.1

## 2021-04-10 NOTE — TOC Progression Note (Signed)
Transition of Care [facility]) - Progression Note    Patient Details  Name: Sheila Morgan MRN: [id number] Date of Birth: 1932-09-28  Transition of Care [facility]) CM/SW Contact  Sheila Morgan, Sheila Pulse, Sheila Morgan Phone Number: 04/10/2021, 10:34 AM  Clinical Narrative: Sheila Morgan to dtr Sheila Morgan-informed of [place] has covid bed today.Sheila Morgan does not want [facility]-too far.she will take patient home w/HHC, may need PTAR @ d/c. Continue to monitor. Per attending not medically stable for d/c today.     Expected Discharge Plan: [place] Barriers to Discharge: Continued Medical Work up  Expected Discharge Plan and Services Expected Discharge Plan: [place]   Discharge Planning Services: CM Consult Post Acute Care Choice: [place] arrangements for the past 2 months: Single Family Home                                       Social Determinants of Health (SDOH) Interventions    Readmission Risk Interventions No flowsheet data found.

## 2021-04-10 NOTE — Progress Notes (Signed)
PROGRESS NOTE    Sheila Morgan  VHQ:[id number] DOB: December 05, 1932 DOA: 04/05/2021 PCP: Binnie Rail, MD   Brief Narrative: Sheila Morgan is a 87 y.o. female with medical history significant of type 2 diabetes, hypothyroidism, hypertension, CAD, CABG, prior history of COVID has taken COVID vaccinations and monoclonal antibodies family called EMS as patient was acting different.  Family watches her through the camera and noticed that she was acting different and confused.  When they went to check on her they found her very weak and confused with fever. Complained of cough. Family thought she had foul-smelling urine and increased urinary frequency. PO intake has been poor with decreased appetite. History was obtained from the patient's daughter as patient was confused and unable to provide any relevant information.    Assessment & Plan:   Principal Problem:   COVID   Sepsis present on admission due to COVID PNA On admission patient was tachypneic and tachycardic with leukocytosis and fever of 101.2 Chest x-ray unremarkable UA unremarkable for UTI BC x2 NGTD Completed azithromycin and Rocephin Completed molnupiravir Continue Decadron, nebulizers, inhalers, cough suppressants as needed  History of type 2 diabetes mellitus Hypoglycemia worsened by steroid use Hemoglobin A1c 7.2 SSI, glargine  Hx of P. Afib Now with Afib with RVR Started on diltiazem drip Continue coreg pending on BP and HR, continue xarelto   History of hypertension ON diltiazem drip, continue coreg as mentioned above Hold home Norvasc and Imdur for now  Hyperlipidemia on Zocor   Hypothyroidism on Synthroid   DVT on xarelto   Goals of care  Patient is full code Seen by physical therapy recommending SNF, daughter wants to take her home once stable   Estimated body mass index is 26.07 kg/m as calculated from the following:   Height as of this encounter: 5\' 4"  (1.626 m).   Weight as of this encounter:  68.9 kg.  DVT prophylaxis: Xarelto Code Status: Full code Family Communication: Discussed with daughter Disposition Plan:  Status is: Inpatient  Remains inpatient appropriate because: IV antibiotics    Consultants: None  Procedures: None Antimicrobials: Completed Rocephin, azithromycin and PaxLOVID  Subjective Patient denies any new complaints. Appears comfortable despite HR in the 120s-150s  Objective: Vitals:   04/10/21 1200 04/10/21 1248 04/10/21 1300 04/10/21 1310  BP: 137/70 (!) 136/112 122/81   Pulse: 93 96    Resp: 18  (!) 25 20  Temp:      TempSrc:      SpO2: 94%  98%   Weight:      Height:        Intake/Output Summary (Last 24 hours) at 04/10/2021 1435 Last data filed at 04/10/2021 1300 Gross per 24 hour  Intake 480 ml  Output 650 ml  Net -170 ml   Filed Weights   04/05/21 1531 04/05/21 2026  Weight: 71.2 kg 68.9 kg    Examination: General: NAD  Cardiovascular: S1, S2 present Respiratory: CTAB Abdomen: Soft, nontender, nondistended, bowel sounds present Musculoskeletal: No bilateral pedal edema noted Skin: Normal Psychiatry: Normal mood     Data Reviewed: I have personally reviewed following labs and imaging studies  CBC: Recent Labs  Lab 04/05/21 1303 04/06/21 0528 04/07/21 0146 04/08/21 0910 04/09/21 0751 04/10/21 0522  WBC 13.2* 8.7 6.7 10.5 7.8 9.3  NEUTROABS 11.5*  --  5.8  --  6.9 8.1*  HGB 12.9 11.6* 12.4 12.0 11.1* 11.8*  HCT 38.8 35.4* 38.1 36.0 35.4* 35.1*  MCV 92.2 91.9 93.6 91.4 95.2  90.2  PLT 164 133* 129* 148* 164 948   Basic Metabolic Panel: Recent Labs  Lab 04/06/21 0528 04/07/21 0146 04/08/21 0910 04/09/21 0751 04/10/21 0522  NA 135 139 140 140 138  K 3.1* 4.0 3.7 3.8 3.6  CL 104 107 110 108 109  CO2 25 23 21* 23 21*  GLUCOSE 119* 228* 200* 230* 251*  BUN 11 14 22  34* 36*  CREATININE 1.04* 1.16* 1.05* 1.09* 1.00  CALCIUM 9.0 8.7* 8.8* 9.1 9.3  MG  --   --   --   --  2.4   GFR: Estimated Creatinine  Clearance: 38.5 mL/min (by C-G formula based on SCr of 1 mg/dL). Liver Function Tests: Recent Labs  Lab 04/05/21 1303 04/06/21 0528 04/07/21 0146 04/08/21 0910  AST 26 34 38 27  ALT 18 17 21 21   ALKPHOS 71 57 59 48  BILITOT 1.0 1.0 0.7 0.5  PROT 7.3 5.9* 6.6 6.4*  ALBUMIN 4.0 3.1* 3.0* 3.0*   No results for input(s): LIPASE, AMYLASE in the last 168 hours. Recent Labs  Lab 04/07/21 0146  AMMONIA 41*   Coagulation Profile: Recent Labs  Lab 04/05/21 1303 04/06/21 0528  INR 2.0* 3.1*   Cardiac Enzymes: No results for input(s): CKTOTAL, CKMB, CKMBINDEX, TROPONINI in the last 168 hours. BNP (last 3 results) No results for input(s): PROBNP in the last 8760 hours. HbA1C: No results for input(s): HGBA1C in the last 72 hours.  CBG: Recent Labs  Lab 04/09/21 1120 04/09/21 1739 04/09/21 2034 04/10/21 0757 04/10/21 1157  GLUCAP 300* 248* 368* 294* 176*   Lipid Profile: No results for input(s): CHOL, HDL, LDLCALC, TRIG, CHOLHDL, LDLDIRECT in the last 72 hours. Thyroid Function Tests: No results for input(s): TSH, T4TOTAL, FREET4, T3FREE, THYROIDAB in the last 72 hours. Anemia Panel: No results for input(s): VITAMINB12, FOLATE, FERRITIN, TIBC, IRON, RETICCTPCT in the last 72 hours. Sepsis Labs: Recent Labs  Lab 04/05/21 1733 04/05/21 1828 04/05/21 2140 04/06/21 0528 04/06/21 1437 04/06/21 1645 04/07/21 0146  PROCALCITON 0.22  --   --  0.87  --   --  0.69  LATICACIDVEN  --    < > 2.9*  --  1.1 1.9 1.9   < > = values in this interval not displayed.    Recent Results (from the past 240 hour(s))  Blood Culture (routine x 2)     Status: None   Collection Time: 04/05/21  1:03 PM   Specimen: BLOOD  Result Value Ref Range Status   Specimen Description   Final    BLOOD BLOOD RIGHT FOREARM Performed at [facility] [address], [place], [place] [address]    Special Requests   Final    BOTTLES DRAWN AEROBIC AND ANAEROBIC Blood Culture results  may not be optimal due to an inadequate volume of blood received in culture bottles Performed at [facility] [address] [place], [place] [address]    Culture   Final    NO GROWTH 5 DAYS Performed at [facility], [place] [address] [place], [place] [address]    Report Status 04/10/2021 FINAL  Final  Blood Culture (routine x 2)     Status: None   Collection Time: 04/05/21  1:03 PM   Specimen: BLOOD  Result Value Ref Range Status   Specimen Description   Final    BLOOD BLOOD LEFT WRIST Performed at [facility] [address], [place],  [address]    Special  Requests   Final    BOTTLES DRAWN AEROBIC AND ANAEROBIC Blood Culture adequate volume Performed at [facility] [address], [place], [place] [address]    Culture   Final    NO GROWTH 5 DAYS Performed at [facility], [place] [address], [place], [place] [address]    Report Status 04/10/2021 FINAL  Final  Resp Panel by RT-PCR (Flu A&B, Covid)     Status: Abnormal   Collection Time: 04/05/21  1:03 PM   Specimen: Nasopharyngeal(NP) swabs in vial transport medium  Result Value Ref Range Status   SARS Coronavirus 2 by RT PCR POSITIVE (A) NEGATIVE Final    Comment: RESULT CALLED TO, READ BACK BY AND VERIFIED WITH: GARRISON G. RN ON 04/05/2021 @ 1556 BY MECIAL J. (NOTE) SARS-CoV-2 target nucleic acids are DETECTED.  The SARS-CoV-2 RNA is generally detectable in upper respiratory specimens during the acute phase of infection. Positive results are indicative of the presence of the identified virus, but do not rule out bacterial infection or co-infection with other pathogens not detected by the test. Clinical correlation with patient history and other diagnostic information is necessary to determine patient infection status. The expected result is Negative.  Fact Sheet for Patients: EntrepreneurPulse.com.au  Fact Sheet for  Healthcare Providers: IncredibleEmployment.be  This test is not yet approved or cleared by the [place] FDA and  has been authorized for detection and/or diagnosis of SARS-CoV-2 by FDA under an Emergency Use Authorization (EUA).  This EUA will remain in effect (meaning t his test can be used) for the duration of  the COVID-19 declaration under Section 564(b)(1) of the Act, 21 U.S.C. section 360bbb-3(b)(1), unless the authorization is terminated or revoked sooner.     Influenza A by PCR NEGATIVE NEGATIVE Final   Influenza B by PCR NEGATIVE NEGATIVE Final    Comment: (NOTE) The Xpert Xpress SARS-CoV-2/FLU/RSV plus assay is intended as an aid in the diagnosis of influenza from Nasopharyngeal swab specimens and should not be used as a sole basis for treatment. Nasal washings and aspirates are unacceptable for Xpert Xpress SARS-CoV-2/FLU/RSV testing.  Fact Sheet for Patients: EntrepreneurPulse.com.au  Fact Sheet for Healthcare Providers: IncredibleEmployment.be  This test is not yet approved or cleared by the [place] FDA and has been authorized for detection and/or diagnosis of SARS-CoV-2 by FDA under an Emergency Use Authorization (EUA). This EUA will remain in effect (meaning this test can be used) for the duration of the COVID-19 declaration under Section 564(b)(1) of the Act, 21 U.S.C. section 360bbb-3(b)(1), unless the authorization is terminated or revoked.  Performed at [facility], [place] [address] [place], [place] [address]   Urine Culture     Status: Abnormal   Collection Time: 04/05/21  4:02 PM   Specimen: In/Out Cath Urine  Result Value Ref Range Status   Specimen Description   Final    IN/OUT CATH URINE Performed at [facility] [address] [place], [place] [address]    Special Requests   Final    NONE Performed at [facility], [place]  [address] [place], [place] [address]    Culture MULTIPLE SPECIES PRESENT, SUGGEST RECOLLECTION (A)  Final   Report Status 04/07/2021 FINAL  Final  MRSA Next Gen by PCR, Nasal     Status: None   Collection Time: 04/06/21  4:54 PM   Specimen: Nasal Mucosa; Nasal Swab  Result Value Ref Range Status   MRSA by PCR  Next Gen NOT DETECTED NOT DETECTED Final    Comment: (NOTE) The GeneXpert MRSA Assay (FDA approved for NASAL specimens only), is one component of a comprehensive MRSA colonization surveillance program. It is not intended to diagnose MRSA infection nor to guide or monitor treatment for MRSA infections. Test performance is not FDA approved in patients less than 24 years old. Performed at [facility], [place] [address], [place], [place] [address]           Radiology Studies: No results found.      Scheduled Meds:  carvedilol  25 mg Oral BID WC   dexamethasone  6 mg Oral Daily   feeding supplement (GLUCERNA SHAKE)  237 mL Oral BID BM   insulin aspart  0-15 Units Subcutaneous TID WC   insulin aspart  0-5 Units Subcutaneous QHS   insulin glargine-yfgn  10 Units Subcutaneous BID   levothyroxine  50 mcg Oral Q0600   mouth rinse  15 mL Mouth Rinse BID   molnupiravir EUA  4 capsule Oral BID   multivitamin with minerals  1 tablet Oral Daily   Ensure Max Protein  11 oz Oral Daily   rivaroxaban  15 mg Oral Q supper   Continuous Infusions:  diltiazem (CARDIZEM) infusion 12.5 mg/hr (04/10/21 1245)     LOS: 5 days   Alma Friendly, MD 04/10/2021, 2:35 PM

## 2021-04-10 NOTE — Plan of Care (Signed)
  Problem: Coping: Goal: Psychosocial and spiritual needs will be supported Outcome: Progressing   Problem: Respiratory: Goal: Will maintain a patent airway Outcome: Progressing   Problem: Health Behavior/Discharge Planning: Goal: Ability to manage health-related needs will improve Outcome: Progressing   Problem: Clinical Measurements: Goal: Ability to maintain clinical measurements within normal limits will improve Outcome: Progressing Goal: Will remain free from infection Outcome: Progressing Goal: Diagnostic test results will improve Outcome: Progressing   Problem: Activity: Goal: Risk for activity intolerance will decrease Outcome: Progressing   Problem: Nutrition: Goal: Adequate nutrition will be maintained Outcome: Progressing   Problem: Coping: Goal: Level of anxiety will decrease Outcome: Progressing   Problem: Elimination: Goal: Will not experience complications related to bowel motility Outcome: Progressing Goal: Will not experience complications related to urinary retention Outcome: Progressing   Problem: Pain Managment: Goal: General experience of comfort will improve Outcome: Progressing   Problem: Safety: Goal: Ability to remain free from injury will improve Outcome: Progressing   Problem: Skin Integrity: Goal: Risk for impaired skin integrity will decrease Outcome: Progressing   Problem: Education: Goal: Knowledge of risk factors and measures for prevention of condition will improve Outcome: Not Progressing   Problem: Respiratory: Goal: Complications related to the disease process, condition or treatment will be avoided or minimized Outcome: Not Progressing   Problem: Education: Goal: Knowledge of General Education information will improve Description: Including pain rating scale, medication(s)/side effects and non-pharmacologic comfort measures Outcome: Not Progressing   Problem: Clinical Measurements: Goal: Respiratory complications  will improve Outcome: Not Progressing Goal: Cardiovascular complication will be avoided Outcome: Not Progressing

## 2021-04-11 ENCOUNTER — Inpatient Hospital Stay (HOSPITAL_COMMUNITY): Payer: Medicare Other

## 2021-04-11 DIAGNOSIS — I10 Essential (primary) hypertension: Secondary | ICD-10-CM

## 2021-04-11 DIAGNOSIS — I4891 Unspecified atrial fibrillation: Secondary | ICD-10-CM | POA: Diagnosis not present

## 2021-04-11 DIAGNOSIS — U071 COVID-19: Secondary | ICD-10-CM | POA: Diagnosis not present

## 2021-04-11 LAB — URINALYSIS, ROUTINE W REFLEX MICROSCOPIC
Bilirubin Urine: NEGATIVE
Glucose, UA: NEGATIVE mg/dL
Ketones, ur: NEGATIVE mg/dL
Nitrite: NEGATIVE
Protein, ur: 100 mg/dL — AB
Specific Gravity, Urine: 1.016 (ref 1.005–1.030)
WBC, UA: >50 WBC/hpf — ABNORMAL HIGH (ref 0–5)
pH: 6 (ref 5.0–8.0)

## 2021-04-11 LAB — BASIC METABOLIC PANEL
Anion gap: 6 (ref 5–15)
BUN: 43 mg/dL — ABNORMAL HIGH (ref 8–23)
CO2: 24 mmol/L (ref 22–32)
Calcium: 9.2 mg/dL (ref 8.9–10.3)
Chloride: 108 mmol/L (ref 98–111)
Creatinine, Ser: 1.4 mg/dL — ABNORMAL HIGH (ref 0.44–1.00)
GFR, Estimated: 37 mL/min — ABNORMAL LOW (ref >60)
Glucose, Bld: 213 mg/dL — ABNORMAL HIGH (ref 70–99)
Potassium: 3.8 mmol/L (ref 3.5–5.1)
Sodium: 138 mmol/L (ref 135–145)

## 2021-04-11 LAB — CBC WITH DIFFERENTIAL/PLATELET
Abs Immature Granulocytes: 0.19 10*3/uL — ABNORMAL HIGH (ref 0.00–0.07)
Basophils Absolute: 0 10*3/uL (ref 0.0–0.1)
Basophils Relative: 0 %
Eosinophils Absolute: 0 10*3/uL (ref 0.0–0.5)
Eosinophils Relative: 0 %
HCT: 34.1 % — ABNORMAL LOW (ref 36.0–46.0)
Hemoglobin: 11.4 g/dL — ABNORMAL LOW (ref 12.0–15.0)
Immature Granulocytes: 2 %
Lymphocytes Relative: 7 %
Lymphs Abs: 0.8 10*3/uL (ref 0.7–4.0)
MCH: 30.2 pg (ref 26.0–34.0)
MCHC: 33.4 g/dL (ref 30.0–36.0)
MCV: 90.2 fL (ref 80.0–100.0)
Monocytes Absolute: 0.6 10*3/uL (ref 0.1–1.0)
Monocytes Relative: 5 %
Neutro Abs: 10.2 10*3/uL — ABNORMAL HIGH (ref 1.7–7.7)
Neutrophils Relative %: 86 %
Platelets: 192 10*3/uL (ref 150–400)
RBC: 3.78 MIL/uL — ABNORMAL LOW (ref 3.87–5.11)
RDW: 13.8 % (ref 11.5–15.5)
WBC: 11.8 10*3/uL — ABNORMAL HIGH (ref 4.0–10.5)
nRBC: 0.3 % — ABNORMAL HIGH (ref 0.0–0.2)

## 2021-04-11 LAB — GLUCOSE, CAPILLARY
Glucose-Capillary: 206 mg/dL — ABNORMAL HIGH (ref 70–99)
Glucose-Capillary: 236 mg/dL — ABNORMAL HIGH (ref 70–99)
Glucose-Capillary: 331 mg/dL — ABNORMAL HIGH (ref 70–99)

## 2021-04-11 MED ORDER — DIGOXIN 0.25 MG/ML IJ SOLN
0.2500 mg | Freq: Once | INTRAMUSCULAR | Status: AC
  Administered 2021-04-11: 0.25 mg via INTRAVENOUS
  Filled 2021-04-11: qty 2

## 2021-04-11 NOTE — Progress Notes (Signed)
Physical Therapy Treatment Patient Details Name: KENOSHA DOSTER MRN: [id number] DOB: 08-22-1932 Today's Date: 04/11/2021   History of Present Illness 87 yo female admitted with confusion, sepsis, COVID. Hx of CAD, OA, gout, hearing loss, R shoulder radiculopathy, DM, CABG, shingles, MI, falls    PT Comments    Pt was up in the recliner at start of PT session. HR went up to 155 as pt scooted forward in preparation for sit to stand. Transfer was deferred 2* elevated HR. Pt agreed to seated BUE/LE exercises. Noted HR at rest was 116-125, and during exercises was 125-135.     Recommendations for follow up therapy are one component of a multi-disciplinary discharge planning process, led by the attending physician.  Recommendations may be updated based on patient status, additional functional criteria and insurance authorization.  Follow Up Recommendations  Skilled nursing-short term rehab (<3 hours/day)     Assistance Recommended at Discharge    Equipment Recommendations  None recommended by PT    Recommendations for Other Services       Precautions / Restrictions Precautions Precautions: Fall Precaution Comments: monitor HR  (Afib with RVR as high as 155 today, sats stable on RA) Restrictions Weight Bearing Restrictions: No     Mobility  Bed Mobility Overal bed mobility: Needs Assistance Bed Mobility: Supine to Sit     Supine to sit: Min assist;HOB elevated     General bed mobility comments: VCs for hand placement 1/2 way through process    Transfers Overall transfer level: Needs assistance Equipment used: Rolling walker (2 wheels) Transfers: Sit to/from Stand;Bed to chair/wheelchair/BSC Sit to Stand: Mod assist Stand pivot transfers: Min assist         General transfer comment: pt up in recliner, HR up to 155 with scooting forward in recliner so deferred transfers, pt agreed to seated exercises    Ambulation/Gait                   Stairs              Wheelchair Mobility    Modified Rankin (Stroke Patients Only)       Balance Overall balance assessment: Needs assistance Sitting-balance support: No upper extremity supported;Feet supported Sitting balance-Leahy Scale: Good     Standing balance support: Bilateral upper extremity supported;Reliant on assistive device for balance Standing balance-Leahy Scale: Poor                              Cognition Arousal/Alertness: Awake/alert Behavior During Therapy: WFL for tasks assessed/performed Overall Cognitive Status: History of cognitive impairments - at baseline Area of Impairment: Memory;Problem solving                     Memory: Decreased short-term memory       Problem Solving: Requires verbal cues          Exercises General Exercises - Lower Extremity Ankle Circles/Pumps: AROM;Both;15 reps;Seated Long Arc Quad: AROM;Both;20 reps;Seated Heel Slides: AROM;Both;10 reps;Supine Hip ABduction/ADduction: AROM;Both;10 reps;Supine Hip Flexion/Marching: AROM;Both;10 reps;Seated Shoulder Exercises Shoulder Flexion: AROM;Both;10 reps;Seated    General Comments        Pertinent Vitals/Pain Pain Assessment: No/denies pain    Home Living                          Prior Function            PT Goals (  current goals can now be found in the care plan section) Acute Rehab PT Goals Patient Stated Goal: to get better PT Goal Formulation: With patient Time For Goal Achievement: 04/21/21 Potential to Achieve Goals: Good Progress towards PT goals: Progressing toward goals    Frequency    Min 2X/week      PT Plan Current plan remains appropriate    Co-evaluation              AM-PAC PT "6 Clicks" Mobility   Outcome Measure  Help needed turning from your back to your side while in a flat bed without using bedrails?: A Little Help needed moving from lying on your back to sitting on the side of a flat bed without using  bedrails?: A Little Help needed moving to and from a bed to a chair (including a wheelchair)?: A Little Help needed standing up from a chair using your arms (e.g., wheelchair or bedside chair)?: A Little Help needed to walk in hospital room?: A Lot Help needed climbing 3-5 steps with a railing? : A Lot 6 Click Score: 16    End of Session Equipment Utilized During Treatment: Gait belt Activity Tolerance: Treatment limited secondary to medical complications (Comment) (HR up to 155 with minimal activity) Patient left: in chair;with chair alarm set;with call bell/phone within reach Nurse Communication: Mobility status PT Visit Diagnosis: History of falling (Z91.81);Unsteadiness on feet (R26.81);Muscle weakness (generalized) (M62.81);Difficulty in walking, not elsewhere classified (R26.2)     Time: 1062-6948 PT Time Calculation (min) (ACUTE ONLY): 15 min  Charges:  $Therapeutic Exercise: 8-22 mins                     Blondell Reveal Kistler PT 04/11/2021  Acute Rehabilitation Services Pager [phone] Office [phone]

## 2021-04-11 NOTE — Progress Notes (Signed)
PROGRESS NOTE    Sheila Morgan  TKP:[id number] DOB: [birth year] DOA: 04/05/2021 PCP: Binnie Rail, MD   Brief Narrative: Sheila Morgan is a 87 y.o. female with medical history significant of type 2 diabetes, hypothyroidism, hypertension, CAD, CABG, prior history of COVID has taken COVID vaccinations and monoclonal antibodies family called EMS as patient was acting different.  Family watches her through the camera and noticed that she was acting different and confused.  When they went to check on her they found her very weak and confused with fever. Complained of cough. Family thought she had foul-smelling urine and increased urinary frequency. PO intake has been poor with decreased appetite. History was obtained from the patient's daughter as patient was confused and unable to provide any relevant information.    Assessment & Plan:   Principal Problem:   COVID   Sepsis present on admission due to COVID PNA On admission patient was tachypneic and tachycardic with leukocytosis and fever of 101.2 Currently on room air, saturating well Chest x-ray unremarkable, repeat showing bibasilar atelectasis/infiltrate, completed azithromycin and Rocephin UA unremarkable for UTI, repeat UA showing large leukocytes, few bacteria, greater than 50 WBC Urine culture pending BC x2 NGTD Completed azithromycin and Rocephin Completed molnupiravir Continue Decadron, nebulizers, inhalers, cough suppressants as needed We will hold off on further antibiotics for now until urine culture pending.  If leukocytosis worsens may consider AB.   History of type 2 diabetes mellitus Hypoglycemia worsened by steroid use Hemoglobin A1c 7.2 SSI, glargine  Hx of P. Afib Now with Afib with RVR Started on diltiazem drip, maxed out Cardiology consulted, recommended loading dose of digoxin, will follow Continue coreg pending on BP Continue xarelto   History of hypertension On diltiazem drip, continue coreg as  mentioned above Hold home Norvasc and Imdur for now  Hyperlipidemia on Zocor   Hypothyroidism on Synthroid   DVT on xarelto   Goals of care  Patient is full code Seen by physical therapy recommending SNF   Estimated body mass index is 26.07 kg/m as calculated from the following:   Height as of this encounter: 5\' 4"  (1.626 m).   Weight as of this encounter: 68.9 kg.  DVT prophylaxis: Xarelto Code Status: Full code Family Communication: Discussed with daughter Disposition Plan:  Status is: Inpatient  Remains inpatient appropriate because: IV antibiotics    Consultants: Cardiology  Procedures: None Antimicrobials: Completed Rocephin, azithromycin and PaxLOVID  Subjective Patient continues to deny any new complaints, denies any chest pain, shortness of breath, abdominal pain, worsening cough, fever/chills.   Objective: Vitals:   04/11/21 0100 04/11/21 0457 04/11/21 0844 04/11/21 1450  BP:  114/70    Pulse:  84    Resp:  20    Temp: 97.8 F (36.6 C) 97.6 F (36.4 C)  98.2 F (36.8 C)  TempSrc: Oral Axillary  Oral  SpO2:  96% 98%   Weight:      Height:        Intake/Output Summary (Last 24 hours) at 04/11/2021 1924 Last data filed at 04/11/2021 0200 Gross per 24 hour  Intake 131.08 ml  Output 400 ml  Net -268.92 ml   Filed Weights   04/05/21 1531 04/05/21 2026  Weight: 71.2 kg 68.9 kg    Examination: General: NAD  Cardiovascular: S1, S2 present Respiratory: CTAB Abdomen: Soft, nontender, nondistended, bowel sounds present Musculoskeletal: No bilateral pedal edema noted Skin: Normal Psychiatry: Normal mood     Data Reviewed: I have personally reviewed following  labs and imaging studies  CBC: Recent Labs  Lab 04/05/21 1303 04/06/21 0528 04/07/21 0146 04/08/21 0910 04/09/21 0751 04/10/21 0522 04/11/21 0547  WBC 13.2*   < > 6.7 10.5 7.8 9.3 11.8*  NEUTROABS 11.5*  --  5.8  --  6.9 8.1* 10.2*  HGB 12.9   < > 12.4 12.0 11.1* 11.8* 11.4*  HCT  38.8   < > 38.1 36.0 35.4* 35.1* 34.1*  MCV 92.2   < > 93.6 91.4 95.2 90.2 90.2  PLT 164   < > 129* 148* 164 173 192   < > = values in this interval not displayed.   Basic Metabolic Panel: Recent Labs  Lab 04/07/21 0146 04/08/21 0910 04/09/21 0751 04/10/21 0522 04/11/21 0547  NA 139 140 140 138 138  K 4.0 3.7 3.8 3.6 3.8  CL 107 110 108 109 108  CO2 23 21* 23 21* 24  GLUCOSE 228* 200* 230* 251* 213*  BUN 14 22 34* 36* 43*  CREATININE 1.16* 1.05* 1.09* 1.00 1.40*  CALCIUM 8.7* 8.8* 9.1 9.3 9.2  MG  --   --   --  2.4  --    GFR: Estimated Creatinine Clearance: 27.5 mL/min (A) (by C-G formula based on SCr of 1.4 mg/dL (H)). Liver Function Tests: Recent Labs  Lab 04/05/21 1303 04/06/21 0528 04/07/21 0146 04/08/21 0910  AST 26 34 38 27  ALT 18 17 21 21   ALKPHOS 71 57 59 48  BILITOT 1.0 1.0 0.7 0.5  PROT 7.3 5.9* 6.6 6.4*  ALBUMIN 4.0 3.1* 3.0* 3.0*   No results for input(s): LIPASE, AMYLASE in the last 168 hours. Recent Labs  Lab 04/07/21 0146  AMMONIA 41*   Coagulation Profile: Recent Labs  Lab 04/05/21 1303 04/06/21 0528  INR 2.0* 3.1*   Cardiac Enzymes: No results for input(s): CKTOTAL, CKMB, CKMBINDEX, TROPONINI in the last 168 hours. BNP (last 3 results) No results for input(s): PROBNP in the last 8760 hours. HbA1C: No results for input(s): HGBA1C in the last 72 hours.  CBG: Recent Labs  Lab 04/10/21 1157 04/10/21 1612 04/10/21 2058 04/11/21 0735 04/11/21 1151  GLUCAP 176* 158* 246* 206* 236*   Lipid Profile: No results for input(s): CHOL, HDL, LDLCALC, TRIG, CHOLHDL, LDLDIRECT in the last 72 hours. Thyroid Function Tests: No results for input(s): TSH, T4TOTAL, FREET4, T3FREE, THYROIDAB in the last 72 hours. Anemia Panel: No results for input(s): VITAMINB12, FOLATE, FERRITIN, TIBC, IRON, RETICCTPCT in the last 72 hours. Sepsis Labs: Recent Labs  Lab 04/05/21 1733 04/05/21 1828 04/05/21 2140 04/06/21 0528 04/06/21 1437 04/06/21 1645  04/07/21 0146  PROCALCITON 0.22  --   --  0.87  --   --  0.69  LATICACIDVEN  --    < > 2.9*  --  1.1 1.9 1.9   < > = values in this interval not displayed.    Recent Results (from the past 240 hour(s))  Blood Culture (routine x 2)     Status: None   Collection Time: 04/05/21  1:03 PM   Specimen: BLOOD  Result Value Ref Range Status   Specimen Description   Final    BLOOD BLOOD RIGHT FOREARM Performed at [facility] [address] [place], [place] [address]    Special Requests   Final    BOTTLES DRAWN AEROBIC AND ANAEROBIC Blood Culture results may not be optimal due to an inadequate volume of blood received in culture bottles Performed at [facility]  [address] [place], [place] [address]    Culture   Final    NO GROWTH 5 DAYS Performed at [facility], [place] [address] [place], [place] [address]    Report Status 04/10/2021 FINAL  Final  Blood Culture (routine x 2)     Status: None   Collection Time: 04/05/21  1:03 PM   Specimen: BLOOD  Result Value Ref Range Status   Specimen Description   Final    BLOOD BLOOD LEFT WRIST Performed at [facility] [address], [place], [place] [address]    Special Requests   Final    BOTTLES DRAWN AEROBIC AND ANAEROBIC Blood Culture adequate volume Performed at [facility] [address] [place], [place] [address]    Culture   Final    NO GROWTH 5 DAYS Performed at [facility], [place] [address], [place], [place] [address]    Report Status 04/10/2021 FINAL  Final  Resp Panel by RT-PCR (Flu A&B, Covid)     Status: Abnormal   Collection Time: 04/05/21  1:03 PM   Specimen: Nasopharyngeal(NP) swabs in vial transport medium  Result Value Ref Range Status   SARS Coronavirus 2 by RT PCR POSITIVE (A) NEGATIVE Final    Comment: RESULT CALLED TO, READ BACK BY AND VERIFIED WITH: GARRISON G. RN ON 04/05/2021 @ 1556 BY MECIAL J. (NOTE) SARS-CoV-2  target nucleic acids are DETECTED.  The SARS-CoV-2 RNA is generally detectable in upper respiratory specimens during the acute phase of infection. Positive results are indicative of the presence of the identified virus, but do not rule out bacterial infection or co-infection with other pathogens not detected by the test. Clinical correlation with patient history and other diagnostic information is necessary to determine patient infection status. The expected result is Negative.  Fact Sheet for Patients: EntrepreneurPulse.com.au  Fact Sheet for Healthcare Providers: IncredibleEmployment.be  This test is not yet approved or cleared by the [place] FDA and  has been authorized for detection and/or diagnosis of SARS-CoV-2 by FDA under an Emergency Use Authorization (EUA).  This EUA will remain in effect (meaning t his test can be used) for the duration of  the COVID-19 declaration under Section 564(b)(1) of the Act, 21 U.S.C. section 360bbb-3(b)(1), unless the authorization is terminated or revoked sooner.     Influenza A by PCR NEGATIVE NEGATIVE Final   Influenza B by PCR NEGATIVE NEGATIVE Final    Comment: (NOTE) The Xpert Xpress SARS-CoV-2/FLU/RSV plus assay is intended as an aid in the diagnosis of influenza from Nasopharyngeal swab specimens and should not be used as a sole basis for treatment. Nasal washings and aspirates are unacceptable for Xpert Xpress SARS-CoV-2/FLU/RSV testing.  Fact Sheet for Patients: EntrepreneurPulse.com.au  Fact Sheet for Healthcare Providers: IncredibleEmployment.be  This test is not yet approved or cleared by the [place] FDA and has been authorized for detection and/or diagnosis of SARS-CoV-2 by FDA under an Emergency Use Authorization (EUA). This EUA will remain in effect (meaning this test can be used) for the duration of the COVID-19 declaration under Section  564(b)(1) of the Act, 21 U.S.C. section 360bbb-3(b)(1), unless the authorization is terminated or revoked.  Performed at [facility], [employer] [address] [place], [place] [address]   Urine Culture     Status: Abnormal   Collection Time: 04/05/21  4:02 PM   Specimen: In/Out Cath Urine  Result Value Ref Range Status   Specimen Description   Final  IN/OUT CATH URINE Performed at [facility], [place] [address] [place], [place] [address]    Special Requests   Final    NONE Performed at [facility], [place] [address] [place], [place] [address]    Culture MULTIPLE SPECIES PRESENT, SUGGEST RECOLLECTION (A)  Final   Report Status 04/07/2021 FINAL  Final  MRSA Next Gen by PCR, Nasal     Status: None   Collection Time: 04/06/21  4:54 PM   Specimen: Nasal Mucosa; Nasal Swab  Result Value Ref Range Status   MRSA by PCR Next Gen NOT DETECTED NOT DETECTED Final    Comment: (NOTE) The GeneXpert MRSA Assay (FDA approved for NASAL specimens only), is one component of a comprehensive MRSA colonization surveillance program. It is not intended to diagnose MRSA infection nor to guide or monitor treatment for MRSA infections. Test performance is not FDA approved in patients less than 57 years old. Performed at [facility], [place] [address] [place],  [address]           Radiology Studies: [facility] Chest Port 1 View  Result Date: 04/11/2021 CLINICAL DATA:  Ago cytosis. EXAM: PORTABLE CHEST 1 VIEW COMPARISON:  04/06/2021 FINDINGS: 0803 hours. The cardio pericardial silhouette is enlarged. Bibasilar atelectasis/infiltrate with small pleural effusions noted. No pulmonary edema. Bones are diffusely demineralized. Telemetry leads overlie the chest. IMPRESSION: Bibasilar atelectasis/infiltrate with small pleural effusions. Electronically Signed   By: Misty Stanley M.D.   On: 04/11/2021 08:37        Scheduled Meds:   carvedilol  25 mg Oral BID WC   dexamethasone  6 mg Oral Daily   feeding supplement (GLUCERNA SHAKE)  237 mL Oral BID BM   insulin aspart  0-15 Units Subcutaneous TID WC   insulin aspart  0-5 Units Subcutaneous QHS   insulin glargine-yfgn  10 Units Subcutaneous BID   levothyroxine  50 mcg Oral Q0600   mouth rinse  15 mL Mouth Rinse BID   multivitamin with minerals  1 tablet Oral Daily   Ensure Max Protein  11 oz Oral Daily   rivaroxaban  15 mg Oral Q supper   Continuous Infusions:  diltiazem (CARDIZEM) infusion 15 mg/hr (04/11/21 1809)     LOS: 6 days   Alma [facility], MD 04/11/2021, 7:24 PM

## 2021-04-11 NOTE — Consult Note (Signed)
Cardiology Consultation:   Patient ID: Sheila Morgan MRN: [id number]; DOB: 1933/04/03  Admit date: 04/05/2021 Date of Consult: 04/11/2021  PCP:  Binnie Rail, MD   [facility] Providers Cardiologist:  Quay Burow, MD        Patient Profile:   Sheila Morgan is a 87 y.o. female with a hx of type 2 diabetes, hypothyroidism, hypertension, coronary artery disease status post PCI as well as coronary artery bypass grafting with LIMA to LAD and known Paroxysmal atrial fibrillation on Xarelto who is being seen 04/11/2021 for the evaluation of atrial fibrillation with rapid ventricular rate at the request of Dr. Lesia Sago.  History of Present Illness:   Sheila Morgan presented to be evaluated after her family called EMS given the fact that the patient was acting different.  During my encounter she does not really give me good history therefore some of the information noted here in the HPI has been transcribed from chart review.  Further work-up the patient family watches her to her camera and noticed that she was acting confused.  They therefore went into check on her and noted that she was feeling very weak had a fever and was confused.  There was concerned that she may have had some foul urine therefore the patient was brought in to be evaluated.  Patient was noted to be COVID-positive with COVID-pneumonia.  Given that she is in atrial fibrillation rapid ventricular rate cardiology has been consulted to help with the management of this patient.  Of note the patient follows with my partner Dr. Quay Burow and was last seen by him on December 31, 2020.  At that time her blood pressure was elevated and she was asked to keep a blood pressure log and follow-up with our pharmacist in the hypertension clinic her blood pressure was so elevated that therefore she was switched from losartan to olmesartan 20 mg daily and asked to follow-up in 6 weeks.  He was unable to make that follow-up  visit.  She was seen examined at her bedside.  Unfortunately the patient is confused and was unable to give me history as well as review of systems is not reliable.  Past Medical History:  Diagnosis Date   Arthritis    Cancer ([place])    skin on nose .  mylenoma   Coronary artery disease    status post post LAD angioplasty in 1993  and subsequent coronary artery bypass grafting x1 with a LIMA to the LAD July of 1994. She had stenting of her RCA with a bare-metal stent November 2002.   Diabetes mellitus    2   Gout    Heart murmur    Hematoma    Right groin   Hemorrhoids    Hiatal hernia    History of blood transfusion    HOH (hard of hearing)    Hyperlipidemia    Hypertension    Left knee DJD    Lightheadedness    has seen Dr Gwenlyn Found and ENT.. cant find out why   Myocardial infarction [facility])    Osteoporosis    PONV (postoperative nausea and vomiting)    Shingles 10/27/2013    Past Surgical History:  Procedure Laterality Date   CORONARY ANGIOPLASTY  1993   LAD   CORONARY ANGIOPLASTY WITH STENT PLACEMENT  03/09/01   RCA   CORONARY ARTERY BYPASS GRAFT  1994   JOINT REPLACEMENT Right 1998   hip   NM MYOVIEW LTD  03/2012  Normal   SKIN GRAFT     to nose skin cancer   TOTAL HIP ARTHROPLASTY     TOTAL KNEE ARTHROPLASTY Left 10/09/2013   Procedure: TOTAL KNEE ARTHROPLASTY;  Surgeon: Lorn Junes, MD;  Location: [place];  Service: Orthopedics;  Laterality: Left;     Home Medications:  Prior to Admission medications   Medication Sig Start Date End Date Taking? Authorizing Provider  acetaminophen (TYLENOL) 500 MG tablet Take 500 mg by mouth every 6 (six) hours as needed for moderate pain.   Yes [provider]  alendronate (FOSAMAX) 70 MG tablet Take 70 mg by mouth every Saturday. Take with a full glass of water on an empty stomach.   Yes [provider]  amLODipine (NORVASC) 5 MG tablet TAKE 1 AND 1/2 TABLETS(7.5 MG) BY MOUTH DAILY 09/04/20  Yes Burns, Claudina Lick, MD   Calcium Carbonate-Vitamin D (CALCIUM 600-D PO) Take 1 tablet by mouth 2 (two) times daily.   Yes [provider]  carvedilol (COREG) 25 MG tablet TAKE 1 TABLET BY MOUTH TWICE DAILY WITH A MEAL 11/25/20  Yes Burns, Claudina Lick, MD  colchicine 0.6 MG tablet Take 1 tablet by mouth every other day.   Yes [provider]  furosemide (LASIX) 20 MG tablet TAKE 1 TABLET(20 MG) BY MOUTH DAILY 03/04/21  Yes Burns, Claudina Lick, MD  isosorbide mononitrate (IMDUR) 30 MG 24 hr tablet TAKE 1 TABLET(30 MG) BY MOUTH DAILY 02/17/21  Yes Burns, Claudina Lick, MD  levothyroxine (SYNTHROID) 50 MCG tablet TAKE 1 TABLET(50 MCG) BY MOUTH DAILY 09/05/20  Yes Burns, Claudina Lick, MD  metFORMIN (GLUCOPHAGE) 500 MG tablet TAKE 1/2 TABLET(250 MG) BY MOUTH DAILY WITH LARGEST MEAL 09/04/20  Yes Burns, Claudina Lick, MD  MULTIPLE VITAMIN PO Take 1 tablet by mouth daily.   Yes [provider]  olmesartan (BENICAR) 40 MG tablet Take 1 tablet (40 mg total) by mouth daily. 03/06/21  Yes Lorretta Harp, MD  predniSONE (DELTASONE) 5 MG tablet Take 5 mg by mouth daily with breakfast.   Yes [provider]  rivaroxaban (XARELTO) 20 MG TABS tablet Take 1 tablet (20 mg total) by mouth daily with supper. 12/10/20  Yes Caron Presume K, PA-C  simvastatin (ZOCOR) 40 MG tablet TAKE 1 TABLET BY MOUTH IN THE EVENING Patient taking differently: Take 20 mg by mouth every evening. TAKE 1 TABLET BY MOUTH IN THE EVENING 01/14/21  Yes Lorretta Harp, MD  vitamin B-12 (CYANOCOBALAMIN) 1000 MCG tablet Take 1,000 mcg by mouth daily.   Yes [provider]  acetaminophen (TYLENOL) 325 MG tablet Take 2 tablets (650 mg total) by mouth every 6 (six) hours as needed for mild pain, fever or headache. Patient not taking: Reported on 04/05/2021 07/19/18   Rai, Ripudeep K, MD  glucose blood test strip OneTouch Ultra Test strips  USE TO TEST BLOOD SUGAR TWICE DAILY    [provider]  Lancets (ONETOUCH DELICA PLUS [id number]) [place] USE AS  DIRECTED TO TEST BLOOD SUGAR TWICE DAILY 11/04/20   Burns, Claudina Lick, MD  [facility] ULTRA test strip USE TO TEST BLOOD SUGAR TWICE DAILY 05/09/20   Binnie Rail, MD    Inpatient Medications: Scheduled Meds:  carvedilol  25 mg Oral BID WC   dexamethasone  6 mg Oral Daily   feeding supplement (GLUCERNA SHAKE)  237 mL Oral BID BM   insulin aspart  0-15 Units Subcutaneous TID WC   insulin aspart  0-5 Units Subcutaneous QHS  insulin glargine-yfgn  10 Units Subcutaneous BID   levothyroxine  50 mcg Oral Q0600   mouth rinse  15 mL Mouth Rinse BID   multivitamin with minerals  1 tablet Oral Daily   Ensure Max Protein  11 oz Oral Daily   rivaroxaban  15 mg Oral Q supper   Continuous Infusions:  diltiazem (CARDIZEM) infusion 15 mg/hr (04/11/21 1025)   PRN Meds: acetaminophen, albuterol  Allergies:   No Known Allergies  Social History:   Social History   Socioeconomic History   Marital status: Widowed    Spouse name: Not on file   Number of children: 2   Years of education: Not on file   Highest education level: 12th grade  Occupational History   Occupation: Retired   Tobacco Use   Smoking status: Never   Smokeless tobacco: Never  Vaping Use   Vaping Use: Never used  Substance and Sexual Activity   Alcohol use: No   Drug use: No   Sexual activity: Never  Other Topics Concern   Not on file  Social History Narrative   Left handed    Caffeine 4 cups daily    Lives at home alone    Social Determinants of Health   Financial Resource Strain: Not on file  Food Insecurity: Not on file  Transportation Needs: Not on file  Physical Activity: Not on file  Stress: Not on file  Social Connections: Not on file  Intimate Partner Violence: Not on file    Family History:    Family History  Problem Relation Age of Onset   Heart disease Father    Cancer Sister        breast   Diabetes Sister    Heart disease Brother    Heart disease Brother    Heart disease Brother    Diabetes  Sister    Stroke Mother      ROS:  Please see the history of present illness.   All other ROS reviewed and negative however unreliable due to reports of fusion with this patient.  Physical Exam/Data:   Vitals:   04/11/21 0100 04/11/21 0457 04/11/21 0844 04/11/21 1450  BP:  114/70    Pulse:  84    Resp:  20    Temp: 97.8 F (36.6 C) 97.6 F (36.4 C)  98.2 F (36.8 C)  TempSrc: Oral Axillary  Oral  SpO2:  96% 98%   Weight:      Height:        Intake/Output Summary (Last 24 hours) at 04/11/2021 1722 Last data filed at 04/11/2021 0200 Gross per 24 hour  Intake 131.08 ml  Output 400 ml  Net -268.92 ml   Last 3 Weights 04/05/2021 04/05/2021 01/23/2021  Weight (lbs) 151 lb 14.4 oz 157 lb 157 lb  Weight (kg) 68.9 kg 71.215 kg 71.215 kg     Body mass index is 26.07 kg/m.  General:  Well nourished, well developed, in no acute distress HEENT: normal Neck: no JVD Vascular: No carotid bruits; Distal pulses 2+ bilaterally Cardiac:  normal S1, S2; RRR; no murmur  Lungs:  clear to auscultation bilaterally, no wheezing, rhonchi or rales  Abd: soft, nontender, no hepatomegaly  Ext: no edema Musculoskeletal:  No deformities, BUE and BLE strength normal and equal Skin: warm and dry  Neuro:  CNs 2-12 intact, no focal abnormalities noted Psych:  Normal affect   EKG:  The EKG was personally reviewed and demonstrates: Atrial fibrillation with rapid ventricular rate heart  rate for 142 bpm done yesterday.  Telemetry:  Telemetry was personally reviewed and demonstrates: Atrial fibrillation with rapid ventricular rate.  Relevant CV Studies: Transthoracic echocardiogram done December 27, 2020 IMPRESSIONS   1. Left ventricular ejection fraction, by estimation, is 65 to 70%. The  left ventricle has normal function. The left ventricle has no regional  wall motion abnormalities. Left ventricular diastolic parameters are  consistent with Grade I diastolic  dysfunction (impaired relaxation).    2. Right ventricular systolic function is normal. The right ventricular  size is normal.   3. Mild mitral valve regurgitation.   4. Tricuspid valve regurgitation is mild to moderate.   5. The aortic valve is tricuspid. Aortic valve regurgitation is not  visualized. Mild aortic valve sclerosis is present, with no evidence of aortic valve stenosis.   6. The inferior vena cava is normal in size with greater than 50%  respiratory variability, suggesting right atrial pressure of 3 mmHg.   FINDINGS   Left Ventricle: Left ventricular ejection fraction, by estimation, is 65  to 70%. The left ventricle has normal function. The left ventricle has no  regional wall motion abnormalities. Definity contrast agent was given IV  to delineate the left ventricular   endocardial borders. The left ventricular internal cavity size was normal  in size. There is no left ventricular hypertrophy. Left ventricular  diastolic parameters are consistent with Grade I diastolic dysfunction  (impaired relaxation).   Right Ventricle: The right ventricular size is normal. Right vetricular  wall thickness was not assessed. Right ventricular systolic function is  normal.   Left Atrium: Left atrial size was normal in size.   Right Atrium: Right atrial size was normal in size.   Pericardium: Trivial pericardial effusion is present.   Mitral Valve: There is mild thickening of the mitral valve leaflet(s).  Mild mitral annular calcification. Mild mitral valve regurgitation.   Tricuspid Valve: The tricuspid valve is normal in structure. Tricuspid  valve regurgitation is mild to moderate.   Aortic Valve: The aortic valve is tricuspid. Aortic valve regurgitation is  not visualized. Mild aortic valve sclerosis is present, with no evidence  of aortic valve stenosis.   Pulmonic Valve: The pulmonic valve was grossly normal. Pulmonic valve  regurgitation is not visualized.   Aorta: The aortic root and ascending aorta are  structurally normal, with  no evidence of dilitation.   Venous: The inferior vena cava is normal in size with greater than 50%  respiratory variability, suggesting right atrial pressure of 3 mmHg.   IAS/Shunts: No atrial level shunt detected by color flow Doppler.   Laboratory Data:  High Sensitivity Troponin:  No results for input(s): TROPONINIHS in the last 720 hours.   Chemistry Recent Labs  Lab 04/09/21 0751 04/10/21 0522 04/11/21 0547  NA 140 138 138  K 3.8 3.6 3.8  CL 108 109 108  CO2 23 21* 24  GLUCOSE 230* 251* 213*  BUN 34* 36* 43*  CREATININE 1.09* 1.00 1.40*  CALCIUM 9.1 9.3 9.2  MG  --  2.4  --   GFRNONAA 49* 55* 37*  ANIONGAP 9 8 6     Recent Labs  Lab 04/06/21 0528 04/07/21 0146 04/08/21 0910  PROT 5.9* 6.6 6.4*  ALBUMIN 3.1* 3.0* 3.0*  AST 34 38 27  ALT 17 21 21   ALKPHOS 57 59 48  BILITOT 1.0 0.7 0.5   Lipids No results for input(s): CHOL, TRIG, HDL, LABVLDL, LDLCALC, CHOLHDL in the last 168 hours.  Hematology Recent  Labs  Lab 04/09/21 0751 04/10/21 0522 04/11/21 0547  WBC 7.8 9.3 11.8*  RBC 3.72* 3.89 3.78*  HGB 11.1* 11.8* 11.4*  HCT 35.4* 35.1* 34.1*  MCV 95.2 90.2 90.2  MCH 29.8 30.3 30.2  MCHC 31.4 33.6 33.4  RDW 13.8 13.8 13.8  PLT 164 173 192   Thyroid  Recent Labs  Lab 04/06/21 0528  TSH 1.263    BNPNo results for input(s): BNP, PROBNP in the last 168 hours.  DDimer  Recent Labs  Lab 04/06/21 0528  DDIMER 0.46     Radiology/Studies:  [facility] Chest Port 1 View  Result Date: 04/11/2021 CLINICAL DATA:  Ago cytosis. EXAM: PORTABLE CHEST 1 VIEW COMPARISON:  04/06/2021 FINDINGS: 0803 hours. The cardio pericardial silhouette is enlarged. Bibasilar atelectasis/infiltrate with small pleural effusions noted. No pulmonary edema. Bones are diffusely demineralized. Telemetry leads overlie the chest. IMPRESSION: Bibasilar atelectasis/infiltrate with small pleural effusions. Electronically Signed   By: Misty Stanley M.D.   On: 04/11/2021  08:37     Assessment and Plan:   Atrial fibrillation with rapid ventricular rate likely precipitated by her current infectious state.  She has been started on Cardizem drip her blood pressure also has been on the lower side Coreg h has also been started and she is on 25 mg twice daily.  Her heart rate is still in the 130s and 140s we will like to do is give a one-time dose of digoxin loading to 250 mcg to help with rate control.  With her infection we may not get her heart rate completely controlled but if we can get her leniently less than 120 bpm we can monitor her.  Hopefully once her ventricular rate is achieved she can be able to spontaneously convert without needing cardioversion.  For now please continue the patient on her Xarelto.  2.  Sepsis pneumonia that is being managed by the primary team with antibiotics.  3.  Coronary artery disease no angina no need for any work-up in this regard.  Continue patient on her current medication.  4.  Diabetes mellitus-being managed by the primary team.  5.  Hypertension agree with holding Imdur as well as well labs giving marginal blood pressure on her Coreg and Cardizem drip. 6.  Hyperlipidemia continue Zocor.  Risk Assessment/Risk Scores:          CHA2DS2-VASc Score = 7   This indicates a 11.2% annual risk of stroke. The patient's score is based upon: CHF History: 1 HTN History: 1 Diabetes History: 1 Stroke History: 0 Vascular Disease History: 1 Age Score: 2 Gender Score: 1         For questions or updates, please contact [place] Please consult [email or web address] for contact info under    Signed, Berniece Salines, DO  04/11/2021 5:22 PM

## 2021-04-11 NOTE — Progress Notes (Signed)
Occupational Therapy Treatment Patient Details Name: Sheila Morgan MRN: [id number] DOB: 09/20/1932 Today's Date: 04/11/2021   History of present illness 87 yo female admitted with confusion, sepsis, COVID. Hx of CAD, OA, gout, hearing loss, R shoulder radiculopathy, DM, CABG, shingles, MI, falls   OT comments  This 87 yo female admitted with above seen today to focus on bed mobility, sit<>stand, transfers and toileting. Pt requires increased A for all tasks due to decreased balance, decreased endurance and increase HR (Afib with RVR) at rest and with activity. She will continue to benefit from acute OT with follow up at SNF primary recommendation (otherwise home with HHOT and 24 hour care).    Recommendations for follow up therapy are one component of a multi-disciplinary discharge planning process, led by the attending physician.  Recommendations may be updated based on patient status, additional functional criteria and insurance authorization.    Follow Up Recommendations  Skilled nursing-short term rehab (<3 hours/day) (primary recommendation, if not then [place])    Assistance Recommended at Discharge Frequent or constant Supervision/Assistance  Equipment Recommendations  BSC/3in1       Precautions / Restrictions Precautions Precautions: Fall Precaution Comments: monitor HR and O2 (Afib with RVR as high as 144 today, sats stable on RA) Restrictions Weight Bearing Restrictions: No       Mobility Bed Mobility Overal bed mobility: Needs Assistance Bed Mobility: Supine to Sit     Supine to sit: Min assist;HOB elevated     General bed mobility comments: VCs for hand placement 1/2 way through process    Transfers Overall transfer level: Needs assistance Equipment used: Rolling walker (2 wheels) Transfers: Sit to/from Stand;Bed to chair/wheelchair/BSC Sit to Stand: Mod assist Stand pivot transfers: Min assist         General transfer comment: A to rise     Balance  Overall balance assessment: Needs assistance Sitting-balance support: No upper extremity supported;Feet supported Sitting balance-Leahy Scale: Good     Standing balance support: Bilateral upper extremity supported;Reliant on assistive device for balance Standing balance-Leahy Scale: Poor                             ADL either performed or assessed with clinical judgement   ADL Overall ADL's : Needs assistance/impaired                           Toilet Transfer Details (indicate cue type and reason): Mod A sit<>stand (from standard bed height), min A stand pivot to recliner, total A for back peri and front peri cleaning as well as for clothing maniupulation)                Extremity/Trunk Assessment Upper Extremity Assessment Upper Extremity Assessment: Generalized weakness            Vision Baseline Vision/History: 1 Wears glasses Ability to See in Adequate Light: 0 Adequate Patient Visual Report: No change from baseline            Cognition Arousal/Alertness: Awake/alert Behavior During Therapy: WFL for tasks assessed/performed Overall Cognitive Status: History of cognitive impairments - at baseline  Pertinent Vitals/ Pain       Pain Assessment: No/denies pain         Frequency  Min 2X/week        Progress Toward Goals  OT Goals(current goals can now be found in the care plan section)  Progress towards OT goals: Progressing toward goals  Acute Rehab OT Goals Patient Stated Goal: to get up and move around OT Goal Formulation: With patient Time For Goal Achievement: 04/22/21 Potential to Achieve Goals: Good  Plan Discharge plan remains appropriate       AM-PAC OT "6 Clicks" Daily Activity     Outcome Measure   Help from another person eating meals?: None Help from another person taking care of personal grooming?: A Little Help from another person  toileting, which includes using toliet, bedpan, or urinal?: A Lot Help from another person bathing (including washing, rinsing, drying)?: A Lot Help from another person to put on and taking off regular upper body clothing?: A Little Help from another person to put on and taking off regular lower body clothing?: A Lot 6 Click Score: 16    End of Session Equipment Utilized During Treatment: Gait belt;Rolling walker (2 wheels)  OT Visit Diagnosis: Unsteadiness on feet (R26.81);Other symptoms and signs involving cognitive function;History of falling (Z91.81);Muscle weakness (generalized) (M62.81)   Activity Tolerance  (limited by increase in HR despite on max dose ot meds to combat this per RN)   Patient Left in chair;with call bell/phone within reach;with chair alarm set   Nurse Communication Mobility status (to NT as well as pt needed a new purewick and cleaned up)        Time: 1055-1120 OT Time Calculation (min): 25 min  Charges: OT General Charges $OT Visit: 1 Visit OT Treatments $Self Care/Home Management : 23-37 mins  Sheila Morgan, OTR/L Acute [employer] Pager [phone] Office [phone]    Almon Register 04/11/2021, 12:16 PM

## 2021-04-11 NOTE — Consult Note (Incomplete)
Cardiology Consultation:   Patient ID: Sheila Morgan MRN: [id number]; DOB: 01/07/1933  Admit date: 04/05/2021 Date of Consult: 04/11/2021  PCP:  Binnie Rail, MD   [facility] Providers Cardiologist:  Quay Burow, MD     Patient Profile:   Sheila Morgan is a 87 y.o. female with a hx of CAD s/p CABG 1v (LIMA to LAD) '94, DES to RCA '02, hypertension, hyperlipidemia, diabetes, paroxysmal atrial fibrillation who is being seen 04/11/2021 for the evaluation of atrial fibrillation with RVR at the request of Dr. Horris Latino.  History of Present Illness:   Sheila Morgan is an 87 year old female with past medical history noted above.  She has been followed by Dr. Gwenlyn Found as an outpatient.  She underwent coronary artery bypass grafting with a LIMA to her LAD in 1994.  Underwent cardiac catheterization in November 2002 with high-grade distal RCA disease treated with PCI/bare-metal stent x1.  She had a stress test in November 2013 which was normal.  Seen in the office by Caron Presume 12/2020 with complaints of lower extremity edema.  He was found to be in new onset atrial fibrillation, rate controlled.  It was recommended that she stop aspirin and start on Xarelto given her elevated chads Vascor of 7.  Note she had previously been on Xarelto with a history of DVT without any bleeding issues.  She was set up for outpatient echocardiogram which was done on 12/27/2020 noting LVEF of 65 to 70%, no regional wall motion normality, grade 1 diastolic dysfunction, normal RV, mild mitral valve regurgitation, mild to moderate tricuspid valve regurgitation.   She was last seen in the office on 12/31/2020 with Dr. Gwenlyn Found and was noted to be hypertensive.  She was on amlodipine, carvedilol and losartan.  It was asked that she keep a 30-day log of her blood pressures and referred to the Pharm.D. to further adjust medications and follow-up.   She presented to the [facility] long ED on 12/3 with altered mental status.   Family noted she was very weak and confused with intermittent fever as well as cough.  Also noted foul-smelling urine and increased urine frequency.  In the ED she was notably positive for COVID.  Started on Paxil loaded.  Also noted to be dehydrated.  In rate controlled atrial fibrillation on admission.  She was noted to have elevated white count along with fever.  Admitted for sepsis. She was treated with vancomycin and cefepime in the ED on admission.  Admitted to internal medicine for further management.   She developed atrial fibrillation with RVR on 12/7 and was started on diltiazem drip, and continued on Coreg as well as Xarelto.     Past Medical History:  Diagnosis Date   Arthritis    Cancer ([place])    skin on nose .  mylenoma   Coronary artery disease    status post post LAD angioplasty in 1993  and subsequent coronary artery bypass grafting x1 with a LIMA to the LAD July of 1994. She had stenting of her RCA with a bare-metal stent November 2002.   Diabetes mellitus    2   Gout    Heart murmur    Hematoma    Right groin   Hemorrhoids    Hiatal hernia    History of blood transfusion    HOH (hard of hearing)    Hyperlipidemia    Hypertension    Left knee DJD    Lightheadedness    has seen Dr Gwenlyn Found and  ENT.. cant find out why   Myocardial infarction [facility])    Osteoporosis    PONV (postoperative nausea and vomiting)    Shingles 10/27/2013    Past Surgical History:  Procedure Laterality Date   CORONARY ANGIOPLASTY  1993   LAD   CORONARY ANGIOPLASTY WITH STENT PLACEMENT  03/09/01   RCA   CORONARY ARTERY BYPASS GRAFT  1994   JOINT REPLACEMENT Right 1998   hip   NM MYOVIEW LTD  03/2012   Normal   SKIN GRAFT     to nose skin cancer   TOTAL HIP ARTHROPLASTY     TOTAL KNEE ARTHROPLASTY Left 10/09/2013   Procedure: TOTAL KNEE ARTHROPLASTY;  Surgeon: Lorn Junes, MD;  Location: [place];  Service: Orthopedics;  Laterality: Left;     Home Medications:  Prior to Admission  medications   Medication Sig Start Date End Date Taking? Authorizing Provider  acetaminophen (TYLENOL) 500 MG tablet Take 500 mg by mouth every 6 (six) hours as needed for moderate pain.   Yes [provider]  alendronate (FOSAMAX) 70 MG tablet Take 70 mg by mouth every Saturday. Take with a full glass of water on an empty stomach.   Yes [provider]  amLODipine (NORVASC) 5 MG tablet TAKE 1 AND 1/2 TABLETS(7.5 MG) BY MOUTH DAILY 09/04/20  Yes Burns, Claudina Lick, MD  Calcium Carbonate-Vitamin D (CALCIUM 600-D PO) Take 1 tablet by mouth 2 (two) times daily.   Yes [provider]  carvedilol (COREG) 25 MG tablet TAKE 1 TABLET BY MOUTH TWICE DAILY WITH A MEAL 11/25/20  Yes Burns, Claudina Lick, MD  colchicine 0.6 MG tablet Take 1 tablet by mouth every other day.   Yes [provider]  furosemide (LASIX) 20 MG tablet TAKE 1 TABLET(20 MG) BY MOUTH DAILY 03/04/21  Yes Burns, Claudina Lick, MD  isosorbide mononitrate (IMDUR) 30 MG 24 hr tablet TAKE 1 TABLET(30 MG) BY MOUTH DAILY 02/17/21  Yes Burns, Claudina Lick, MD  levothyroxine (SYNTHROID) 50 MCG tablet TAKE 1 TABLET(50 MCG) BY MOUTH DAILY 09/05/20  Yes Burns, Claudina Lick, MD  metFORMIN (GLUCOPHAGE) 500 MG tablet TAKE 1/2 TABLET(250 MG) BY MOUTH DAILY WITH LARGEST MEAL 09/04/20  Yes Burns, Claudina Lick, MD  MULTIPLE VITAMIN PO Take 1 tablet by mouth daily.   Yes [provider]  olmesartan (BENICAR) 40 MG tablet Take 1 tablet (40 mg total) by mouth daily. 03/06/21  Yes Lorretta Harp, MD  predniSONE (DELTASONE) 5 MG tablet Take 5 mg by mouth daily with breakfast.   Yes [provider]  rivaroxaban (XARELTO) 20 MG TABS tablet Take 1 tablet (20 mg total) by mouth daily with supper. 12/10/20  Yes Caron Presume K, PA-C  simvastatin (ZOCOR) 40 MG tablet TAKE 1 TABLET BY MOUTH IN THE EVENING Patient taking differently: Take 20 mg by mouth every evening. TAKE 1 TABLET BY MOUTH IN THE EVENING 01/14/21  Yes Lorretta Harp, MD  vitamin  B-12 (CYANOCOBALAMIN) 1000 MCG tablet Take 1,000 mcg by mouth daily.   Yes [provider]  acetaminophen (TYLENOL) 325 MG tablet Take 2 tablets (650 mg total) by mouth every 6 (six) hours as needed for mild pain, fever or headache. Patient not taking: Reported on 04/05/2021 07/19/18   Rai, Ripudeep K, MD  glucose blood test strip OneTouch Ultra Test strips  USE TO TEST BLOOD SUGAR TWICE DAILY    [provider]  Lancets (ONETOUCH DELICA PLUS [id number]) MISC USE AS DIRECTED TO TEST  BLOOD SUGAR TWICE DAILY 11/04/20   Binnie Rail, MD  [facility] ULTRA test strip USE TO TEST BLOOD SUGAR TWICE DAILY 05/09/20   Binnie Rail, MD    Inpatient Medications: Scheduled Meds:  carvedilol  25 mg Oral BID WC   dexamethasone  6 mg Oral Daily   feeding supplement (GLUCERNA SHAKE)  237 mL Oral BID BM   insulin aspart  0-15 Units Subcutaneous TID WC   insulin aspart  0-5 Units Subcutaneous QHS   insulin glargine-yfgn  10 Units Subcutaneous BID   levothyroxine  50 mcg Oral Q0600   mouth rinse  15 mL Mouth Rinse BID   multivitamin with minerals  1 tablet Oral Daily   Ensure Max Protein  11 oz Oral Daily   rivaroxaban  15 mg Oral Q supper   Continuous Infusions:  diltiazem (CARDIZEM) infusion 15 mg/hr (04/11/21 1025)   PRN Meds: acetaminophen, albuterol  Allergies:   No Known Allergies  Social History:   Social History   Socioeconomic History   Marital status: Widowed    Spouse name: Not on file   Number of children: 2   Years of education: Not on file   Highest education level: 12th grade  Occupational History   Occupation: Retired   Tobacco Use   Smoking status: Never   Smokeless tobacco: Never  Vaping Use   Vaping Use: Never used  Substance and Sexual Activity   Alcohol use: No   Drug use: No   Sexual activity: Never  Other Topics Concern   Not on file  Social History Narrative   Left handed    Caffeine 4 cups daily    Lives at home alone    Social Determinants  of Health   Financial Resource Strain: Not on file  Food Insecurity: Not on file  Transportation Needs: Not on file  Physical Activity: Not on file  Stress: Not on file  Social Connections: Not on file  Intimate Partner Violence: Not on file    Family History:   Family History  Problem Relation Age of Onset   Heart disease Father    Cancer Sister        breast   Diabetes Sister    Heart disease Brother    Heart disease Brother    Heart disease Brother    Diabetes Sister    Stroke Mother      ROS:  Please see the history of present illness.  All other ROS reviewed and negative.     Physical Exam/Data:   Vitals:   04/11/21 0100 04/11/21 0457 04/11/21 0844 04/11/21 1450  BP:  114/70    Pulse:  84    Resp:  20    Temp: 97.8 F (36.6 C) 97.6 F (36.4 C)  98.2 F (36.8 C)  TempSrc: Oral Axillary  Oral  SpO2:  96% 98%   Weight:      Height:        Intake/Output Summary (Last 24 hours) at 04/11/2021 1526 Last data filed at 04/11/2021 0200 Gross per 24 hour  Intake 131.08 ml  Output 400 ml  Net -268.92 ml   Last 3 Weights 04/05/2021 04/05/2021 01/23/2021  Weight (lbs) 151 lb 14.4 oz 157 lb 157 lb  Weight (kg) 68.9 kg 71.215 kg 71.215 kg     Body mass index is 26.07 kg/m.  General:  Well nourished, well developed, in no acute distress*** HEENT: normal Neck: no JVD Vascular: No carotid bruits; Distal pulses 2+ bilaterally  Cardiac:  normal S1, S2; RRR; no murmur *** Lungs:  clear to auscultation bilaterally, no wheezing, rhonchi or rales  Abd: soft, nontender, no hepatomegaly  Ext: no edema Musculoskeletal:  No deformities, BUE and BLE strength normal and equal Skin: warm and dry  Neuro:  CNs 2-12 intact, no focal abnormalities noted Psych:  Normal affect   EKG:  The EKG was personally reviewed and demonstrates:  12/3 fibrillation, 95 bpm 12/7 atrial fibrillation with RVR, 142 bpm Telemetry:  Telemetry was personally reviewed and demonstrates:  ***  Relevant  CV Studies:  Echo 12/2020:  IMPRESSIONS     1. Left ventricular ejection fraction, by estimation, is 65 to 70%. The  left ventricle has normal function. The left ventricle has no regional  wall motion abnormalities. Left ventricular diastolic parameters are  consistent with Grade I diastolic  dysfunction (impaired relaxation).   2. Right ventricular systolic function is normal. The right ventricular  size is normal.   3. Mild mitral valve regurgitation.   4. Tricuspid valve regurgitation is mild to moderate.   5. The aortic valve is tricuspid. Aortic valve regurgitation is not  visualized. Mild aortic valve sclerosis is present, with no evidence of  aortic valve stenosis.   6. The inferior vena cava is normal in size with greater than 50%  respiratory variability, suggesting right atrial pressure of 3 mmHg.   FINDINGS   Left Ventricle: Left ventricular ejection fraction, by estimation, is 65  to 70%. The left ventricle has normal function. The left ventricle has no  regional wall motion abnormalities. Definity contrast agent was given IV  to delineate the left ventricular   endocardial borders. The left ventricular internal cavity size was normal  in size. There is no left ventricular hypertrophy. Left ventricular  diastolic parameters are consistent with Grade I diastolic dysfunction  (impaired relaxation).   Right Ventricle: The right ventricular size is normal. Right vetricular  wall thickness was not assessed. Right ventricular systolic function is  normal.   Left Atrium: Left atrial size was normal in size.   Right Atrium: Right atrial size was normal in size.   Pericardium: Trivial pericardial effusion is present.   Mitral Valve: There is mild thickening of the mitral valve leaflet(s).  Mild mitral annular calcification. Mild mitral valve regurgitation.   Tricuspid Valve: The tricuspid valve is normal in structure. Tricuspid  valve regurgitation is mild to moderate.    Aortic Valve: The aortic valve is tricuspid. Aortic valve regurgitation is  not visualized. Mild aortic valve sclerosis is present, with no evidence  of aortic valve stenosis.   Pulmonic Valve: The pulmonic valve was grossly normal. Pulmonic valve  regurgitation is not visualized.   Aorta: The aortic root and ascending aorta are structurally normal, with  no evidence of dilitation.   Venous: The inferior vena cava is normal in size with greater than 50%  respiratory variability, suggesting right atrial pressure of 3 mmHg.   IAS/Shunts: No atrial level shunt detected by color flow Doppler.   Laboratory Data:  High Sensitivity Troponin:  No results for input(s): TROPONINIHS in the last 720 hours.   Chemistry Recent Labs  Lab 04/09/21 0751 04/10/21 0522 04/11/21 0547  NA 140 138 138  K 3.8 3.6 3.8  CL 108 109 108  CO2 23 21* 24  GLUCOSE 230* 251* 213*  BUN 34* 36* 43*  CREATININE 1.09* 1.00 1.40*  CALCIUM 9.1 9.3 9.2  MG  --  2.4  --   [facility]  49* 55* 37*  ANIONGAP 9 8 6     Recent Labs  Lab 04/06/21 0528 04/07/21 0146 04/08/21 0910  PROT 5.9* 6.6 6.4*  ALBUMIN 3.1* 3.0* 3.0*  AST 34 38 27  ALT 17 21 21   ALKPHOS 57 59 48  BILITOT 1.0 0.7 0.5   Lipids No results for input(s): CHOL, TRIG, HDL, LABVLDL, LDLCALC, CHOLHDL in the last 168 hours.  Hematology Recent Labs  Lab 04/09/21 0751 04/10/21 0522 04/11/21 0547  WBC 7.8 9.3 11.8*  RBC 3.72* 3.89 3.78*  HGB 11.1* 11.8* 11.4*  HCT 35.4* 35.1* 34.1*  MCV 95.2 90.2 90.2  MCH 29.8 30.3 30.2  MCHC 31.4 33.6 33.4  RDW 13.8 13.8 13.8  PLT 164 173 192   Thyroid  Recent Labs  Lab 04/06/21 0528  TSH 1.263    BNPNo results for input(s): BNP, PROBNP in the last 168 hours.  DDimer  Recent Labs  Lab 04/06/21 0528  DDIMER 0.46     Radiology/Studies:  [facility] Chest Port 1 View  Result Date: 04/11/2021 CLINICAL DATA:  Ago cytosis. EXAM: PORTABLE CHEST 1 VIEW COMPARISON:  04/06/2021 FINDINGS: 0803 hours. The  cardio pericardial silhouette is enlarged. Bibasilar atelectasis/infiltrate with small pleural effusions noted. No pulmonary edema. Bones are diffusely demineralized. Telemetry leads overlie the chest. IMPRESSION: Bibasilar atelectasis/infiltrate with small pleural effusions. Electronically Signed   By: Misty Stanley M.D.   On: 04/11/2021 08:37     Assessment and Plan:   Sheila Morgan is a 87 y.o. female with a hx of CAD s/p CABG 1v (LIMA to LAD) '94, DES to RCA '02, hypertension, hyperlipidemia, diabetes, paroxysmal atrial fibrillation who is being seen 04/11/2021 for the evaluation of atrial fibrillation with RVR at the request of Dr. Horris Latino.  Atrial fibrillation with RVR: Hx of the same, now with elevated rates in the setting of COVID PNA.  -- currently on Dilt 15mg /hr along with coreg 25mg  BID -- if rates remain elevated could transition to IV amiodarone as she has been anticoagulation*** -- has been Xarelto for stroke risk reduction  Sepsis secondary to COVID-pneumonia: lactic acid initially 2.9, now improved -- antibiotic therapy -- management per primary team  Hypertension: blood pressures are stable, tolerating IV Dilt as well as coreg 25mg  BID  Hyperlipidemia: on statin  Hypothyroidism: TSH 1.263 -- on synthroid  History of DVT: on Xarelto  Risk Assessment/Risk Scores:   CHA2DS2-VASc Score = 7   This indicates a 11.2% annual risk of stroke. The patient's score is based upon: CHF History: 1 HTN History: 1 Diabetes History: 1 Stroke History: 0 Vascular Disease History: 1 Age Score: 2 Gender Score: 1

## 2021-04-11 NOTE — TOC Progression Note (Signed)
Transition of Care [facility]) - Progression Note    Patient Details  Name: Sheila Morgan MRN: [id number] Date of Birth: 1932/05/24  [facility] CM/SW Contact  Madilyn Cephas, Juliann Pulse, RN Phone Number: 04/11/2021, 12:08 PM  Clinical Narrative: Per dtr Newton Pigg not prefer [place] for SNF if d/c is before quarantine of 12/13-prefers [place] [facility] if d/c on 12/13. Dtr prefers home w/HHC if d/c is before 12/13-[facility] following for HHRN/PT/OT.May need PTAR.      Expected Discharge Plan: [place] Barriers to Discharge: Continued Medical Work up  Expected Discharge Plan and Services Expected Discharge Plan: [place]   Discharge Planning Services: CM Consult Post Acute Care Choice: [place] arrangements for the past 2 months: Single Family Home                                       Social Determinants of Health (SDOH) Interventions    Readmission Risk Interventions No flowsheet data found.

## 2021-04-12 DIAGNOSIS — I480 Paroxysmal atrial fibrillation: Secondary | ICD-10-CM | POA: Diagnosis not present

## 2021-04-12 LAB — BASIC METABOLIC PANEL
Anion gap: 7 (ref 5–15)
BUN: 46 mg/dL — ABNORMAL HIGH (ref 8–23)
CO2: 24 mmol/L (ref 22–32)
Calcium: 9.4 mg/dL (ref 8.9–10.3)
Chloride: 109 mmol/L (ref 98–111)
Creatinine, Ser: 1.27 mg/dL — ABNORMAL HIGH (ref 0.44–1.00)
GFR, Estimated: 41 mL/min — ABNORMAL LOW (ref >60)
Glucose, Bld: 199 mg/dL — ABNORMAL HIGH (ref 70–99)
Potassium: 4.1 mmol/L (ref 3.5–5.1)
Sodium: 140 mmol/L (ref 135–145)

## 2021-04-12 LAB — GLUCOSE, CAPILLARY
Glucose-Capillary: 186 mg/dL — ABNORMAL HIGH (ref 70–99)
Glucose-Capillary: 208 mg/dL — ABNORMAL HIGH (ref 70–99)
Glucose-Capillary: 250 mg/dL — ABNORMAL HIGH (ref 70–99)
Glucose-Capillary: 181 mg/dL — ABNORMAL HIGH (ref 70–99)

## 2021-04-12 LAB — CBC WITH DIFFERENTIAL/PLATELET
Abs Immature Granulocytes: 0.49 10*3/uL — ABNORMAL HIGH (ref 0.00–0.07)
Basophils Absolute: 0 10*3/uL (ref 0.0–0.1)
Basophils Relative: 0 %
Eosinophils Absolute: 0 10*3/uL (ref 0.0–0.5)
Eosinophils Relative: 0 %
HCT: 34.4 % — ABNORMAL LOW (ref 36.0–46.0)
Hemoglobin: 11.4 g/dL — ABNORMAL LOW (ref 12.0–15.0)
Immature Granulocytes: 3 %
Lymphocytes Relative: 5 %
Lymphs Abs: 0.8 10*3/uL (ref 0.7–4.0)
MCH: 30.2 pg (ref 26.0–34.0)
MCHC: 33.1 g/dL (ref 30.0–36.0)
MCV: 91 fL (ref 80.0–100.0)
Monocytes Absolute: 0.8 10*3/uL (ref 0.1–1.0)
Monocytes Relative: 5 %
Neutro Abs: 14 10*3/uL — ABNORMAL HIGH (ref 1.7–7.7)
Neutrophils Relative %: 87 %
Platelets: 223 10*3/uL (ref 150–400)
RBC: 3.78 MIL/uL — ABNORMAL LOW (ref 3.87–5.11)
RDW: 13.8 % (ref 11.5–15.5)
WBC: 16.1 10*3/uL — ABNORMAL HIGH (ref 4.0–10.5)
nRBC: 0.9 % — ABNORMAL HIGH (ref 0.0–0.2)

## 2021-04-12 NOTE — Progress Notes (Signed)
    Subjective:   Confused   Objective:  Vitals:   04/11/21 1450 04/11/21 2000 04/12/21 0026 04/12/21 0537  BP:  (!) 130/93 116/75 (!) 145/84  Pulse:  67 (!) 112 (!) 53  Resp:  17 19 19   Temp: 98.2 F (36.8 C) 98.1 F (36.7 C) 98.2 F (36.8 C) 98.3 F (36.8 C)  TempSrc: Oral Oral Oral Oral  SpO2:  96% 97% 98%  Weight:      Height:        Intake/Output from previous day:  Intake/Output Summary (Last 24 hours) at 04/12/2021 0846 Last data filed at 04/12/2021 0600 Gross per 24 hour  Intake 408.59 ml  Output 300 ml  Net 108.59 ml    Physical Exam: Elderly frail female Decreased BS base  No murmur post sternotomy  Abdomen benign  No edema Palpable DP bilaterally   Lab Results: Basic Metabolic Panel: Recent Labs    04/10/21 0522 04/11/21 0547 04/12/21 0535  NA 138 138 140  K 3.6 3.8 4.1  CL 109 108 109  CO2 21* 24 24  GLUCOSE 251* 213* 199*  BUN 36* 43* 46*  CREATININE 1.00 1.40* 1.27*  CALCIUM 9.3 9.2 9.4  MG 2.4  --   --    Liver Function Tests: No results for input(s): AST, ALT, ALKPHOS, BILITOT, PROT, ALBUMIN in the last 72 hours. No results for input(s): LIPASE, AMYLASE in the last 72 hours. CBC: Recent Labs    04/11/21 0547 04/12/21 0535  WBC 11.8* 16.1*  NEUTROABS 10.2* 14.0*  HGB 11.4* 11.4*  HCT 34.1* 34.4*  MCV 90.2 91.0  PLT 192 223     Imaging: DG Chest Port 1 View  Result Date: 04/11/2021 CLINICAL DATA:  Ago cytosis. EXAM: PORTABLE CHEST 1 VIEW COMPARISON:  04/06/2021 FINDINGS: 0803 hours. The cardio pericardial silhouette is enlarged. Bibasilar atelectasis/infiltrate with small pleural effusions noted. No pulmonary edema. Bones are diffusely demineralized. Telemetry leads overlie the chest. IMPRESSION: Bibasilar atelectasis/infiltrate with small pleural effusions. Electronically Signed   By: Misty Stanley M.D.   On: 04/11/2021 08:37    Cardiac Studies:  ECG: aifb rate 142 nonspecific ST changes    Telemetry: Afib rate 110-115    Echo: EF 65-70% mild MR   Medications:    carvedilol  25 mg Oral BID WC   dexamethasone  6 mg Oral Daily   feeding supplement (GLUCERNA SHAKE)  237 mL Oral BID BM   insulin aspart  0-15 Units Subcutaneous TID WC   insulin aspart  0-5 Units Subcutaneous QHS   insulin glargine-yfgn  10 Units Subcutaneous BID   levothyroxine  50 mcg Oral Q0600   mouth rinse  15 mL Mouth Rinse BID   multivitamin with minerals  1 tablet Oral Daily   Ensure Max Protein  11 oz Oral Daily   rivaroxaban  15 mg Oral Q supper      diltiazem (CARDIZEM) infusion 15 mg/hr (04/12/21 0249)    Assessment/Plan:   Afib:  on low dose xarelto continue coreg and cardizem related to age delerium and covid infection Completed molnupiravir  and decadron  CAD/CABG:  no angina or ischemic ECG changes EF normal no ischemic w/u needed given age lack of chest pain and infection   Jenkins Rouge 04/12/2021, 8:46 AM

## 2021-04-12 NOTE — Progress Notes (Signed)
PROGRESS NOTE    Sheila Morgan  DJS:[id number] DOB: 07-06-1932 DOA: 04/05/2021 PCP: Binnie Rail, MD   Brief Narrative: Sheila Morgan is a 87 y.o. female with medical history significant of type 2 diabetes, hypothyroidism, hypertension, CAD, CABG, prior history of COVID has taken COVID vaccinations and monoclonal antibodies family called EMS as patient was acting different.  Family watches her through the camera and noticed that she was acting different and confused.  When they went to check on her they found her very weak and confused with fever. Complained of cough. Family thought she had foul-smelling urine and increased urinary frequency. PO intake has been poor with decreased appetite. History was obtained from the patient's daughter as patient was confused and unable to provide any relevant information.  04/12/21: Patient was seen and examined at bedside.  Denies any chest pain or palpitations.  Tachycardic on diltiazem drip.    Assessment & Plan:   Principal Problem:   COVID   Sepsis present on admission due to COVID PNA On admission patient was tachypneic and tachycardic with leukocytosis and fever of 101.2 Currently on room air, saturating well Chest x-ray unremarkable, repeat showing bibasilar atelectasis/infiltrate, completed azithromycin and Rocephin UA unremarkable for UTI, repeat UA showing large leukocytes, few bacteria, greater than 50 WBC Urine culture pending BC x2 NGTD Completed azithromycin and Rocephin Completed molnupiravir Continue Decadron, nebulizers, inhalers, cough suppressants as needed We will hold off on further antibiotics for now until urine culture pending.  If leukocytosis worsens may consider AB.   Paroxysmal A. fib with RVR TSH within normal limits Currently on diltiazem drip, maxed out Currently on Coreg 25 mg twice daily On Xarelto for CVA prevention Monitor on telemetry  Type 2 diabetes with hyperglycemia, exacerbated by  steroids. Hemoglobin A1c 7.2 Continue SSI, glargine  Resolved AKI on CKD 3A Baseline creatinine appears to be 1.0 GFR 55 Creatinine peaked at 1.4 with GFR of 37 Avoid nephrotoxic agents Monitor urine output   History of hypertension On diltiazem drip, continue coreg as mentioned above Continue to hold home Norvasc and Imdur for now  Hyperlipidemia on Zocor   Hypothyroidism on Synthroid   DVT on xarelto   Goals of care  Patient is full code Seen by physical therapy recommending SNF  Physical debility PT OT recommending SNF TOC assisting with disposition.   Estimated body mass index is 26.07 kg/m as calculated from the following:   Height as of this encounter: 5\' 4"  (1.626 m).   Weight as of this encounter: 68.9 kg.  DVT prophylaxis: Xarelto Code Status: Full code Family Communication: Discussed with daughter Disposition Plan:  Status is: Inpatient  Remains inpatient appropriate because: IV antibiotics    Consultants: Cardiology  Procedures: None Antimicrobials: Completed Rocephin, azithromycin and PaxLOVID   Objective: Vitals:   04/12/21 0026 04/12/21 0537 04/12/21 1139 04/12/21 1356  BP: 116/75 (!) 145/84 122/86 (!) 127/104  Pulse: (!) 112 (!) 53 (!) 109 (!) 51  Resp: 19 19 (!) 24   Temp: 98.2 F (36.8 C) 98.3 F (36.8 C) 97.9 F (36.6 C) 98.3 F (36.8 C)  TempSrc: Oral Oral Oral Oral  SpO2: 97% 98% 98% 98%  Weight:      Height:        Intake/Output Summary (Last 24 hours) at 04/12/2021 1446 Last data filed at 04/12/2021 0951 Gross per 24 hour  Intake 466.44 ml  Output 300 ml  Net 166.44 ml   Filed Weights   04/05/21 1531 04/05/21 2026  Weight: 71.2 kg 68.9 kg    Examination: General: Frail-appearing no acute distress.  She is alert and interactive. Cardiovascular: Regular rate and rhythm no rubs or gallops.   Respiratory: Clear to auscultation no wheezes or rales.   Abdomen: Soft nontender normal bowel sounds present. Musculoskeletal:  No lower extremity edema bilaterally.   Skin: No ulcerative lesions noted. Psychiatry: Mood is appropriate for condition and setting.    Data Reviewed: I have personally reviewed following labs and imaging studies  CBC: Recent Labs  Lab 04/07/21 0146 04/08/21 0910 04/09/21 0751 04/10/21 0522 04/11/21 0547 04/12/21 0535  WBC 6.7 10.5 7.8 9.3 11.8* 16.1*  NEUTROABS 5.8  --  6.9 8.1* 10.2* 14.0*  HGB 12.4 12.0 11.1* 11.8* 11.4* 11.4*  HCT 38.1 36.0 35.4* 35.1* 34.1* 34.4*  MCV 93.6 91.4 95.2 90.2 90.2 91.0  PLT 129* 148* 164 173 192 993   Basic Metabolic Panel: Recent Labs  Lab 04/08/21 0910 04/09/21 0751 04/10/21 0522 04/11/21 0547 04/12/21 0535  NA 140 140 138 138 140  K 3.7 3.8 3.6 3.8 4.1  CL 110 108 109 108 109  CO2 21* 23 21* 24 24  GLUCOSE 200* 230* 251* 213* 199*  BUN 22 34* 36* 43* 46*  CREATININE 1.05* 1.09* 1.00 1.40* 1.27*  CALCIUM 8.8* 9.1 9.3 9.2 9.4  MG  --   --  2.4  --   --    GFR: Estimated Creatinine Clearance: 30.3 mL/min (A) (by C-G formula based on SCr of 1.27 mg/dL (H)). Liver Function Tests: Recent Labs  Lab 04/06/21 0528 04/07/21 0146 04/08/21 0910  AST 34 38 27  ALT 17 21 21   ALKPHOS 57 59 48  BILITOT 1.0 0.7 0.5  PROT 5.9* 6.6 6.4*  ALBUMIN 3.1* 3.0* 3.0*   No results for input(s): LIPASE, AMYLASE in the last 168 hours. Recent Labs  Lab 04/07/21 0146  AMMONIA 41*   Coagulation Profile: Recent Labs  Lab 04/06/21 0528  INR 3.1*   Cardiac Enzymes: No results for input(s): CKTOTAL, CKMB, CKMBINDEX, TROPONINI in the last 168 hours. BNP (last 3 results) No results for input(s): PROBNP in the last 8760 hours. HbA1C: No results for input(s): HGBA1C in the last 72 hours.  CBG: Recent Labs  Lab 04/11/21 0735 04/11/21 1151 04/11/21 2039 04/12/21 0721 04/12/21 1137  GLUCAP 206* 236* 331* 186* 208*   Lipid Profile: No results for input(s): CHOL, HDL, LDLCALC, TRIG, CHOLHDL, LDLDIRECT in the last 72 hours. Thyroid  Function Tests: No results for input(s): TSH, T4TOTAL, FREET4, T3FREE, THYROIDAB in the last 72 hours. Anemia Panel: No results for input(s): VITAMINB12, FOLATE, FERRITIN, TIBC, IRON, RETICCTPCT in the last 72 hours. Sepsis Labs: Recent Labs  Lab 04/05/21 1733 04/05/21 1828 04/05/21 2140 04/06/21 0528 04/06/21 1437 04/06/21 1645 04/07/21 0146  PROCALCITON 0.22  --   --  0.87  --   --  0.69  LATICACIDVEN  --    < > 2.9*  --  1.1 1.9 1.9   < > = values in this interval not displayed.    Recent Results (from the past 240 hour(s))  Blood Culture (routine x 2)     Status: None   Collection Time: 04/05/21  1:03 PM   Specimen: BLOOD  Result Value Ref Range Status   Specimen Description   Final    BLOOD BLOOD RIGHT FOREARM Performed at [facility] [address], [place], [place] [address]    Special Requests   Final  BOTTLES DRAWN AEROBIC AND ANAEROBIC Blood Culture results may not be optimal due to an inadequate volume of blood received in culture bottles Performed at [facility], [place] [address], [place], [place] [address]    Culture   Final    NO GROWTH 5 DAYS Performed at [facility], [place] [address], [place], [place] [address]    Report Status 04/10/2021 FINAL  Final  Blood Culture (routine x 2)     Status: None   Collection Time: 04/05/21  1:03 PM   Specimen: BLOOD  Result Value Ref Range Status   Specimen Description   Final    BLOOD BLOOD LEFT WRIST Performed at [facility] [address] [place], [place] [address]    Special Requests   Final    BOTTLES DRAWN AEROBIC AND ANAEROBIC Blood Culture adequate volume Performed at [facility] [address] [place], [place] [address]    Culture   Final    NO GROWTH 5 DAYS Performed at [facility], [place] [address], [place], [place] [address]    Report Status 04/10/2021 FINAL  Final  Resp Panel by RT-PCR (Flu A&B, Covid)      Status: Abnormal   Collection Time: 04/05/21  1:03 PM   Specimen: Nasopharyngeal(NP) swabs in vial transport medium  Result Value Ref Range Status   SARS Coronavirus 2 by RT PCR POSITIVE (A) NEGATIVE Final    Comment: RESULT CALLED TO, READ BACK BY AND VERIFIED WITH: GARRISON G. RN ON 04/05/2021 @ 1556 BY MECIAL J. (NOTE) SARS-CoV-2 target nucleic acids are DETECTED.  The SARS-CoV-2 RNA is generally detectable in upper respiratory specimens during the acute phase of infection. Positive results are indicative of the presence of the identified virus, but do not rule out bacterial infection or co-infection with other pathogens not detected by the test. Clinical correlation with patient history and other diagnostic information is necessary to determine patient infection status. The expected result is Negative.  Fact Sheet for Patients: EntrepreneurPulse.com.au  Fact Sheet for Healthcare Providers: IncredibleEmployment.be  This test is not yet approved or cleared by the [place] FDA and  has been authorized for detection and/or diagnosis of SARS-CoV-2 by FDA under an Emergency Use Authorization (EUA).  This EUA will remain in effect (meaning t his test can be used) for the duration of  the COVID-19 declaration under Section 564(b)(1) of the Act, 21 U.S.C. section 360bbb-3(b)(1), unless the authorization is terminated or revoked sooner.     Influenza A by PCR NEGATIVE NEGATIVE Final   Influenza B by PCR NEGATIVE NEGATIVE Final    Comment: (NOTE) The Xpert Xpress SARS-CoV-2/FLU/RSV plus assay is intended as an aid in the diagnosis of influenza from Nasopharyngeal swab specimens and should not be used as a sole basis for treatment. Nasal washings and aspirates are unacceptable for Xpert Xpress SARS-CoV-2/FLU/RSV testing.  Fact Sheet for Patients: EntrepreneurPulse.com.au  Fact Sheet for Healthcare  Providers: IncredibleEmployment.be  This test is not yet approved or cleared by the [place] FDA and has been authorized for detection and/or diagnosis of SARS-CoV-2 by FDA under an Emergency Use Authorization (EUA). This EUA will remain in effect (meaning this test can be used) for the duration of the COVID-19 declaration under Section 564(b)(1) of the Act, 21 U.S.C. section 360bbb-3(b)(1), unless the authorization is terminated or revoked.  Performed at [facility], [place] [address], [place],  [address]   Urine Culture  Status: Abnormal   Collection Time: 04/05/21  4:02 PM   Specimen: In/Out Cath Urine  Result Value Ref Range Status   Specimen Description   Final    IN/OUT CATH URINE Performed at [facility], [place] [address] [place], [place] [address]    Special Requests   Final    NONE Performed at [facility], [place] [address], [place], [place] [address]    Culture MULTIPLE SPECIES PRESENT, SUGGEST RECOLLECTION (A)  Final   Report Status 04/07/2021 FINAL  Final  MRSA Next Gen by PCR, Nasal     Status: None   Collection Time: 04/06/21  4:54 PM   Specimen: Nasal Mucosa; Nasal Swab  Result Value Ref Range Status   MRSA by PCR Next Gen NOT DETECTED NOT DETECTED Final    Comment: (NOTE) The GeneXpert MRSA Assay (FDA approved for NASAL specimens only), is one component of a comprehensive MRSA colonization surveillance program. It is not intended to diagnose MRSA infection nor to guide or monitor treatment for MRSA infections. Test performance is not FDA approved in patients less than 63 years old. Performed at [facility], [place] [address], [place], [place] [address]   Urine Culture     Status: None (Preliminary result)   Collection Time: 04/11/21 11:56 AM   Specimen: Urine, Clean Catch  Result Value Ref Range Status   Specimen Description   Final    URINE, CLEAN  CATCH Performed at [facility], [place] [address] [place], [place] [address]    Special Requests   Final    NONE Performed at [facility], [place] [address], [place], [place] [address]    Culture   Final    CULTURE REINCUBATED FOR BETTER GROWTH Performed at [facility], [place] [address], [place], [place] [address]    Report Status PENDING  Incomplete          Radiology Studies: DG Chest Port 1 View  Result Date: 04/11/2021 CLINICAL DATA:  Ago cytosis. EXAM: PORTABLE CHEST 1 VIEW COMPARISON:  04/06/2021 FINDINGS: 0803 hours. The cardio pericardial silhouette is enlarged. Bibasilar atelectasis/infiltrate with small pleural effusions noted. No pulmonary edema. Bones are diffusely demineralized. Telemetry leads overlie the chest. IMPRESSION: Bibasilar atelectasis/infiltrate with small pleural effusions. Electronically Signed   By: Misty Stanley M.D.   On: 04/11/2021 08:37        Scheduled Meds:  carvedilol  25 mg Oral BID WC   dexamethasone  6 mg Oral Daily   feeding supplement (GLUCERNA SHAKE)  237 mL Oral BID BM   insulin aspart  0-15 Units Subcutaneous TID WC   insulin aspart  0-5 Units Subcutaneous QHS   insulin glargine-yfgn  10 Units Subcutaneous BID   levothyroxine  50 mcg Oral Q0600   mouth rinse  15 mL Mouth Rinse BID   multivitamin with minerals  1 tablet Oral Daily   Ensure Max Protein  11 oz Oral Daily   rivaroxaban  15 mg Oral Q supper   Continuous Infusions:  diltiazem (CARDIZEM) infusion 15 mg/hr (04/12/21 1147)     LOS: 7 days   Kayleen Memos, MD 04/12/2021, 2:46 PM

## 2021-04-12 NOTE — Plan of Care (Signed)
  Problem: Education: Goal: Knowledge of risk factors and measures for prevention of condition will improve Outcome: Progressing   Problem: Coping: Goal: Psychosocial and spiritual needs will be supported Outcome: Progressing   

## 2021-04-13 DIAGNOSIS — I480 Paroxysmal atrial fibrillation: Secondary | ICD-10-CM | POA: Diagnosis not present

## 2021-04-13 LAB — GLUCOSE, CAPILLARY
Glucose-Capillary: 190 mg/dL — ABNORMAL HIGH (ref 70–99)
Glucose-Capillary: 272 mg/dL — ABNORMAL HIGH (ref 70–99)
Glucose-Capillary: 245 mg/dL — ABNORMAL HIGH (ref 70–99)
Glucose-Capillary: 216 mg/dL — ABNORMAL HIGH (ref 70–99)

## 2021-04-13 LAB — CBC WITH DIFFERENTIAL/PLATELET
Abs Immature Granulocytes: 0.65 10*3/uL — ABNORMAL HIGH (ref 0.00–0.07)
Basophils Absolute: 0 10*3/uL (ref 0.0–0.1)
Basophils Relative: 0 %
Eosinophils Absolute: 0 10*3/uL (ref 0.0–0.5)
Eosinophils Relative: 0 %
HCT: 34.1 % — ABNORMAL LOW (ref 36.0–46.0)
Hemoglobin: 11.5 g/dL — ABNORMAL LOW (ref 12.0–15.0)
Immature Granulocytes: 4 %
Lymphocytes Relative: 3 %
Lymphs Abs: 0.6 10*3/uL — ABNORMAL LOW (ref 0.7–4.0)
MCH: 30.7 pg (ref 26.0–34.0)
MCHC: 33.7 g/dL (ref 30.0–36.0)
MCV: 90.9 fL (ref 80.0–100.0)
Monocytes Absolute: 0.8 10*3/uL (ref 0.1–1.0)
Monocytes Relative: 5 %
Neutro Abs: 16 10*3/uL — ABNORMAL HIGH (ref 1.7–7.7)
Neutrophils Relative %: 88 %
Platelets: 258 10*3/uL (ref 150–400)
RBC: 3.75 MIL/uL — ABNORMAL LOW (ref 3.87–5.11)
RDW: 14.1 % (ref 11.5–15.5)
WBC: 18.1 10*3/uL — ABNORMAL HIGH (ref 4.0–10.5)
nRBC: 0.7 % — ABNORMAL HIGH (ref 0.0–0.2)

## 2021-04-13 LAB — URINE CULTURE: Culture: 70000 — AB

## 2021-04-13 LAB — BASIC METABOLIC PANEL
Anion gap: 8 (ref 5–15)
BUN: 46 mg/dL — ABNORMAL HIGH (ref 8–23)
CO2: 24 mmol/L (ref 22–32)
Calcium: 9.3 mg/dL (ref 8.9–10.3)
Chloride: 107 mmol/L (ref 98–111)
Creatinine, Ser: 1.24 mg/dL — ABNORMAL HIGH (ref 0.44–1.00)
GFR, Estimated: 42 mL/min — ABNORMAL LOW (ref >60)
Glucose, Bld: 192 mg/dL — ABNORMAL HIGH (ref 70–99)
Potassium: 3.7 mmol/L (ref 3.5–5.1)
Sodium: 139 mmol/L (ref 135–145)

## 2021-04-13 MED ORDER — DILTIAZEM HCL 30 MG PO TABS
30.0000 mg | ORAL_TABLET | Freq: Two times a day (BID) | ORAL | Status: DC
Start: 2021-04-13 — End: 2021-04-14
  Administered 2021-04-13 (×2): 30 mg via ORAL
  Filled 2021-04-13 (×2): qty 1

## 2021-04-13 NOTE — Progress Notes (Signed)
PROGRESS NOTE    Sheila Morgan  URK:[id number] DOB: 11-09-1932 DOA: 04/05/2021 PCP: Binnie Rail, MD   Brief Narrative: Sheila Morgan is a 87 y.o. female with medical history significant of type 2 diabetes, hypothyroidism, hypertension, CAD, CABG, prior history of COVID has taken COVID vaccinations and monoclonal antibodies family called EMS as patient was acting different.  Family watches her through the camera and noticed that she was acting different and confused.  When they went to check on her they found her very weak and confused with fever. Complained of cough. Family thought she had foul-smelling urine and increased urinary frequency. PO intake has been poor with decreased appetite. History was obtained from the patient's daughter as patient was confused and unable to provide any relevant information.  04/13/21: Patient was seen at her bedside this morning.  Daughter was not present in the room.  Very hard of hearing.  She denies any chest pain or palpitations.  She has no new complaints.  IV diltiazem changed to p.o.  Per cardiology.    Assessment & Plan:   Principal Problem:   COVID   Sepsis present on admission due to COVID PNA On admission patient was tachypneic and tachycardic with leukocytosis and fever of 101.2 Currently on room air, saturating well Chest x-ray unremarkable, repeat showing bibasilar atelectasis/infiltrate, completed azithromycin and Rocephin UA unremarkable for UTI, repeat UA showing large leukocytes, few bacteria, greater than 50 WBC Urine culture pending BC x2 NGTD Completed azithromycin and Rocephin Completed molnupiravir Continue Decadron, nebulizers, inhalers, cough suppressants as needed We will hold off on further antibiotics for now until urine culture pending.  If leukocytosis worsens may consider AB.   Paroxysmal A. fib with RVR TSH within normal limits Diltiazem changed to p.o. on 04/13/2021. Continue Coreg 25 mg twice daily On  Xarelto for CVA prevention Monitor on telemetry  Type 2 diabetes with hyperglycemia, exacerbated by steroids. Hemoglobin A1c 7.2 Continue SSI, glargine  Resolved AKI on CKD 3A Baseline creatinine appears to be 1.0 GFR 55 Creatinine peaked at 1.4 with GFR of 37 Avoid nephrotoxic agents Monitor urine output   History of hypertension BP stable Continue diltiazem 30 mg twice daily and Coreg 25 mg twice daily as recommended by cardiology. Continue to hold home Norvasc and Imdur for now  Hyperlipidemia: Continue on Zocor   Hypothyroidism: Continue on Synthroid   DVT on xarelto   Goals of care  Patient is full code Seen by physical therapy recommending SNF  Physical debility PT OT recommending SNF TOC assisting with disposition.   Estimated body mass index is 26.07 kg/m as calculated from the following:   Height as of this encounter: 5\' 4"  (1.626 m).   Weight as of this encounter: 68.9 kg.  DVT prophylaxis: Xarelto Code Status: Full code Family Communication: Discussed with daughter Disposition Plan:  Status is: Inpatient  Remains inpatient appropriate because: IV antibiotics    Consultants: Cardiology  Procedures: None Antimicrobials: Completed Rocephin, azithromycin and PaxLOVID   Objective: Vitals:   04/13/21 0500 04/13/21 0530 04/13/21 1158 04/13/21 1512  BP:  138/84 (!) 132/94 (!) 142/66  Pulse:    (!) 117  Resp: 17 16 17  (!) 22  Temp: 97.8 F (36.6 C)  98 F (36.7 C) 98.2 F (36.8 C)  TempSrc: Oral  Oral Oral  SpO2:    100%  Weight:      Height:        Intake/Output Summary (Last 24 hours) at 04/13/2021 1544 Last data  filed at 04/13/2021 0400 Gross per 24 hour  Intake 270.83 ml  Output 650 ml  Net -379.17 ml   Filed Weights   04/05/21 1531 04/05/21 2026  Weight: 71.2 kg 68.9 kg    Examination: General: Frail-appearing no acute distress.  She is alert and interactive.   Cardiovascular:irregular rate and rhythm no rubs or  gallops. Respiratory: Clear to auscultation with no wheezes or rales.  Abdomen: Soft nontender normal bowel sounds present.   Musculoskeletal: No lower extremity edema bilaterally. Skin: No ulcerative lesions noted. Psychiatry: Mood is appropriate for condition and setting.    Data Reviewed: I have personally reviewed following labs and imaging studies  CBC: Recent Labs  Lab 04/09/21 0751 04/10/21 0522 04/11/21 0547 04/12/21 0535 04/13/21 0533  WBC 7.8 9.3 11.8* 16.1* 18.1*  NEUTROABS 6.9 8.1* 10.2* 14.0* 16.0*  HGB 11.1* 11.8* 11.4* 11.4* 11.5*  HCT 35.4* 35.1* 34.1* 34.4* 34.1*  MCV 95.2 90.2 90.2 91.0 90.9  PLT 164 173 192 223 825   Basic Metabolic Panel: Recent Labs  Lab 04/09/21 0751 04/10/21 0522 04/11/21 0547 04/12/21 0535 04/13/21 0533  NA 140 138 138 140 139  K 3.8 3.6 3.8 4.1 3.7  CL 108 109 108 109 107  CO2 23 21* 24 24 24   GLUCOSE 230* 251* 213* 199* 192*  BUN 34* 36* 43* 46* 46*  CREATININE 1.09* 1.00 1.40* 1.27* 1.24*  CALCIUM 9.1 9.3 9.2 9.4 9.3  MG  --  2.4  --   --   --    GFR: Estimated Creatinine Clearance: 31.1 mL/min (A) (by C-G formula based on SCr of 1.24 mg/dL (H)). Liver Function Tests: Recent Labs  Lab 04/07/21 0146 04/08/21 0910  AST 38 27  ALT 21 21  ALKPHOS 59 48  BILITOT 0.7 0.5  PROT 6.6 6.4*  ALBUMIN 3.0* 3.0*   No results for input(s): LIPASE, AMYLASE in the last 168 hours. Recent Labs  Lab 04/07/21 0146  AMMONIA 41*   Coagulation Profile: No results for input(s): INR, PROTIME in the last 168 hours.  Cardiac Enzymes: No results for input(s): CKTOTAL, CKMB, CKMBINDEX, TROPONINI in the last 168 hours. BNP (last 3 results) No results for input(s): PROBNP in the last 8760 hours. HbA1C: No results for input(s): HGBA1C in the last 72 hours.  CBG: Recent Labs  Lab 04/12/21 1137 04/12/21 1712 04/12/21 2126 04/13/21 0749 04/13/21 1258  GLUCAP 208* 250* 181* 190* 272*   Lipid Profile: No results for input(s):  CHOL, HDL, LDLCALC, TRIG, CHOLHDL, LDLDIRECT in the last 72 hours. Thyroid Function Tests: No results for input(s): TSH, T4TOTAL, FREET4, T3FREE, THYROIDAB in the last 72 hours. Anemia Panel: No results for input(s): VITAMINB12, FOLATE, FERRITIN, TIBC, IRON, RETICCTPCT in the last 72 hours. Sepsis Labs: Recent Labs  Lab 04/06/21 1645 04/07/21 0146  PROCALCITON  --  0.69  LATICACIDVEN 1.9 1.9    Recent Results (from the past 240 hour(s))  Blood Culture (routine x 2)     Status: None   Collection Time: 04/05/21  1:03 PM   Specimen: BLOOD  Result Value Ref Range Status   Specimen Description   Final    BLOOD BLOOD RIGHT FOREARM Performed at [facility] [address] [place], [place] [address]    Special Requests   Final    BOTTLES DRAWN AEROBIC AND ANAEROBIC Blood Culture results may not be optimal due to an inadequate volume of blood received in culture bottles Performed at [facility]  [address] [place], [place] [address]    Culture   Final    NO GROWTH 5 DAYS Performed at [facility], [place] [address] [place], [place] [address]    Report Status 04/10/2021 FINAL  Final  Blood Culture (routine x 2)     Status: None   Collection Time: 04/05/21  1:03 PM   Specimen: BLOOD  Result Value Ref Range Status   Specimen Description   Final    BLOOD BLOOD LEFT WRIST Performed at [facility] [address], [place], [place] [address]    Special Requests   Final    BOTTLES DRAWN AEROBIC AND ANAEROBIC Blood Culture adequate volume Performed at [facility] [address] [place], [place] [address]    Culture   Final    NO GROWTH 5 DAYS Performed at [facility], [place] [address] [place], [place] [address]    Report Status 04/10/2021 FINAL  Final  Resp Panel by RT-PCR (Flu A&B, Covid)     Status: Abnormal   Collection Time: 04/05/21  1:03 PM   Specimen: Nasopharyngeal(NP) swabs in  vial transport medium  Result Value Ref Range Status   SARS Coronavirus 2 by RT PCR POSITIVE (A) NEGATIVE Final    Comment: RESULT CALLED TO, READ BACK BY AND VERIFIED WITH: GARRISON G. RN ON 04/05/2021 @ 1556 BY MECIAL J. (NOTE) SARS-CoV-2 target nucleic acids are DETECTED.  The SARS-CoV-2 RNA is generally detectable in upper respiratory specimens during the acute phase of infection. Positive results are indicative of the presence of the identified virus, but do not rule out bacterial infection or co-infection with other pathogens not detected by the test. Clinical correlation with patient history and other diagnostic information is necessary to determine patient infection status. The expected result is Negative.  Fact Sheet for Patients: EntrepreneurPulse.com.au  Fact Sheet for Healthcare Providers: IncredibleEmployment.be  This test is not yet approved or cleared by the [place] FDA and  has been authorized for detection and/or diagnosis of SARS-CoV-2 by FDA under an Emergency Use Authorization (EUA).  This EUA will remain in effect (meaning t his test can be used) for the duration of  the COVID-19 declaration under Section 564(b)(1) of the Act, 21 U.S.C. section 360bbb-3(b)(1), unless the authorization is terminated or revoked sooner.     Influenza A by PCR NEGATIVE NEGATIVE Final   Influenza B by PCR NEGATIVE NEGATIVE Final    Comment: (NOTE) The Xpert Xpress SARS-CoV-2/FLU/RSV plus assay is intended as an aid in the diagnosis of influenza from Nasopharyngeal swab specimens and should not be used as a sole basis for treatment. Nasal washings and aspirates are unacceptable for Xpert Xpress SARS-CoV-2/FLU/RSV testing.  Fact Sheet for Patients: EntrepreneurPulse.com.au  Fact Sheet for Healthcare Providers: IncredibleEmployment.be  This test is not yet approved or cleared by the [place] FDA  and has been authorized for detection and/or diagnosis of SARS-CoV-2 by FDA under an Emergency Use Authorization (EUA). This EUA will remain in effect (meaning this test can be used) for the duration of the COVID-19 declaration under Section 564(b)(1) of the Act, 21 U.S.C. section 360bbb-3(b)(1), unless the authorization is terminated or revoked.  Performed at [facility], [place] [address], [place], [place] [address]   Urine Culture     Status: Abnormal   Collection Time: 04/05/21  4:02 PM   Specimen: In/Out Cath Urine  Result Value Ref Range Status   Specimen Description   Final  IN/OUT CATH URINE Performed at [facility], [place] [address] [place], [place] [address]    Special Requests   Final    NONE Performed at [facility], [place] [address], [place], [place] [address]    Culture MULTIPLE SPECIES PRESENT, SUGGEST RECOLLECTION (A)  Final   Report Status 04/07/2021 FINAL  Final  MRSA Next Gen by PCR, Nasal     Status: None   Collection Time: 04/06/21  4:54 PM   Specimen: Nasal Mucosa; Nasal Swab  Result Value Ref Range Status   MRSA by PCR Next Gen NOT DETECTED NOT DETECTED Final    Comment: (NOTE) The GeneXpert MRSA Assay (FDA approved for NASAL specimens only), is one component of a comprehensive MRSA colonization surveillance program. It is not intended to diagnose MRSA infection nor to guide or monitor treatment for MRSA infections. Test performance is not FDA approved in patients less than 13 years old. Performed at [facility], [place] [address], [place], [place] [address]   Urine Culture     Status: Abnormal   Collection Time: 04/11/21 11:56 AM   Specimen: Urine, Clean Catch  Result Value Ref Range Status   Specimen Description   Final    URINE, CLEAN CATCH Performed at [facility], [place] [address], [place], [place] [address]    Special Requests   Final     NONE Performed at [facility], [place] [address] [place] [address], [place], [place] [address]    Culture 70,000 COLONIES/mL YEAST (A)  Final   Report Status 04/13/2021 FINAL  Final          Radiology Studies: No results found.      Scheduled Meds:  carvedilol  25 mg Oral BID WC   dexamethasone  6 mg Oral Daily   diltiazem  30 mg Oral BID   feeding supplement (GLUCERNA SHAKE)  237 mL Oral BID BM   insulin aspart  0-15 Units Subcutaneous TID WC   insulin aspart  0-5 Units Subcutaneous QHS   insulin glargine-yfgn  10 Units Subcutaneous BID   levothyroxine  50 mcg Oral Q0600   mouth rinse  15 mL Mouth Rinse BID   multivitamin with minerals  1 tablet Oral Daily   Ensure Max Protein  11 oz Oral Daily   rivaroxaban  15 mg Oral Q supper   Continuous Infusions:     LOS: 8 days   Kayleen Memos, MD 04/13/2021, 3:44 PM

## 2021-04-13 NOTE — Progress Notes (Signed)
    Subjective:   No cardiac complaints MS a bit improved   Objective:  Vitals:   04/13/21 0345 04/13/21 0400 04/13/21 0500 04/13/21 0530  BP:  119/68  138/84  Pulse:      Resp: 13 16 17 16   Temp:   97.8 F (36.6 C)   TempSrc:   Oral   SpO2:      Weight:      Height:        Intake/Output from previous day:  Intake/Output Summary (Last 24 hours) at 04/13/2021 0840 Last data filed at 04/13/2021 0400 Gross per 24 hour  Intake 313.63 ml  Output 650 ml  Net -336.37 ml    Physical Exam: Elderly frail female Decreased BS base  No murmur post sternotomy  Abdomen benign  No edema Palpable DP bilaterally   Lab Results: Basic Metabolic Panel: Recent Labs    04/12/21 0535 04/13/21 0533  NA 140 139  K 4.1 3.7  CL 109 107  CO2 24 24  GLUCOSE 199* 192*  BUN 46* 46*  CREATININE 1.27* 1.24*  CALCIUM 9.4 9.3   Liver Function Tests: No results for input(s): AST, ALT, ALKPHOS, BILITOT, PROT, ALBUMIN in the last 72 hours. No results for input(s): LIPASE, AMYLASE in the last 72 hours. CBC: Recent Labs    04/12/21 0535 04/13/21 0533  WBC 16.1* 18.1*  NEUTROABS 14.0* 16.0*  HGB 11.4* 11.5*  HCT 34.4* 34.1*  MCV 91.0 90.9  PLT 223 258     Imaging: No results found.  Cardiac Studies:  ECG: aifb rate 142 nonspecific ST changes    Telemetry: Afib rate 110-115   Echo: EF 65-70% mild MR   Medications:    carvedilol  25 mg Oral BID WC   dexamethasone  6 mg Oral Daily   feeding supplement (GLUCERNA SHAKE)  237 mL Oral BID BM   insulin aspart  0-15 Units Subcutaneous TID WC   insulin aspart  0-5 Units Subcutaneous QHS   insulin glargine-yfgn  10 Units Subcutaneous BID   levothyroxine  50 mcg Oral Q0600   mouth rinse  15 mL Mouth Rinse BID   multivitamin with minerals  1 tablet Oral Daily   Ensure Max Protein  11 oz Oral Daily   rivaroxaban  15 mg Oral Q supper      diltiazem (CARDIZEM) infusion 15 mg/hr (04/13/21 0235)    Assessment/Plan:   Afib:  on  low dose xarelto continue coreg and cardizem related to age delerium and covid infection  Continue coreg d/c iv cardizem and change to PO  CAD/CABG:  no angina or ischemic ECG changes EF normal no ischemic w/u needed given age lack of chest pain and infection Covid:  CXR basilar atelectasis completed molnupiravir, azithromycin and rocephin on decadron WBC elevated ? Demargination from steroids urine with moderate WBC nitritie negative per primary service    Jenkins Rouge 04/13/2021, 8:40 AM

## 2021-04-14 LAB — GLUCOSE, CAPILLARY
Glucose-Capillary: 156 mg/dL — ABNORMAL HIGH (ref 70–99)
Glucose-Capillary: 268 mg/dL — ABNORMAL HIGH (ref 70–99)
Glucose-Capillary: 188 mg/dL — ABNORMAL HIGH (ref 70–99)
Glucose-Capillary: 168 mg/dL — ABNORMAL HIGH (ref 70–99)

## 2021-04-14 LAB — CBC WITH DIFFERENTIAL/PLATELET
Abs Immature Granulocytes: 0.83 10*3/uL — ABNORMAL HIGH (ref 0.00–0.07)
Basophils Absolute: 0.1 10*3/uL (ref 0.0–0.1)
Basophils Relative: 0 %
Eosinophils Absolute: 0 10*3/uL (ref 0.0–0.5)
Eosinophils Relative: 0 %
HCT: 36.4 % (ref 36.0–46.0)
Hemoglobin: 11.6 g/dL — ABNORMAL LOW (ref 12.0–15.0)
Immature Granulocytes: 5 %
Lymphocytes Relative: 4 %
Lymphs Abs: 0.7 10*3/uL (ref 0.7–4.0)
MCH: 29.4 pg (ref 26.0–34.0)
MCHC: 31.9 g/dL (ref 30.0–36.0)
MCV: 92.4 fL (ref 80.0–100.0)
Monocytes Absolute: 0.9 10*3/uL (ref 0.1–1.0)
Monocytes Relative: 5 %
Neutro Abs: 14.8 10*3/uL — ABNORMAL HIGH (ref 1.7–7.7)
Neutrophils Relative %: 86 %
Platelets: 251 10*3/uL (ref 150–400)
RBC: 3.94 MIL/uL (ref 3.87–5.11)
RDW: 14.1 % (ref 11.5–15.5)
WBC: 17.2 10*3/uL — ABNORMAL HIGH (ref 4.0–10.5)
nRBC: 0.4 % — ABNORMAL HIGH (ref 0.0–0.2)

## 2021-04-14 MED ORDER — DILTIAZEM HCL 60 MG PO TABS
60.0000 mg | ORAL_TABLET | Freq: Three times a day (TID) | ORAL | Status: DC
  Administered 2021-04-14 – 2021-04-15 (×4): 60 mg via ORAL
  Filled 2021-04-14 (×4): qty 1

## 2021-04-14 NOTE — Progress Notes (Signed)
Speech Language Pathology Treatment: Dysphagia  Patient Details Name: Sheila Morgan MRN: [id number] DOB: 1932/10/13 Today's Date: 04/14/2021 Time: 9622-2979 SLP Time Calculation (min) (ACUTE ONLY): 15 min  Assessment / Plan / Recommendation Clinical Impression  Patient seen for skilled ST session focused on dysphagia goals. Per chart review, she continues to have poor appetite. She was awake, alert knew that she was in [address] [address] She declined any solids but did accept liquids (water). She drank water (thin) via straw sips without any overt s/s aspiration or penetration. Voice remained WFL and patient did not c/o any discomfort when swallowing. At this time, SLP will s/o as patient appears to be tolerating current PO diet and does not require further skilled ST services.    HPI HPI: Patient is an 87 y.o. female with PMH: DM-2, hypothyroidism, CAD, CABG, prior h/o COVID (has gotten vaccinated). Family called EMS when they saw her through in-home video monitoring camera that they have setup and she was acting different and confused. When family went to check on patient, she was very weak, confused and febrile. PO intake has reportedly been poor as well. On admission, she was febrile at 101.2 degrees F, COVID +, but without hypoxia as CXR was clear. Preliminary UA was negative for UTI. Patient was dehydrated.      SLP Plan  Discharge SLP treatment due to (comment) (goals met)      Recommendations for follow up therapy are one component of a multi-disciplinary discharge planning process, led by the attending physician.  Recommendations may be updated based on patient status, additional functional criteria and insurance authorization.    Recommendations  Diet recommendations: Dysphagia 3 (mechanical soft);Thin liquid Liquids provided via: Cup;Straw Medication Administration: Whole meds with puree Supervision: Patient able to self feed Compensations: Minimize environmental  distractions;Slow rate;Small sips/bites Postural Changes and/or Swallow Maneuvers: Seated upright 90 degrees                Oral Care Recommendations: Oral care BID;Staff/trained caregiver to provide oral care Follow Up Recommendations: No SLP follow up Assistance recommended at discharge: None SLP Visit Diagnosis: Dysphagia, unspecified (R13.10) Plan: Discharge SLP treatment due to (comment) (goals met)       Sonia Baller, MA, CCC-SLP Speech Therapy

## 2021-04-14 NOTE — Progress Notes (Addendum)
Telemetry reviewed, in Afib with rates 110-130s.  BP 143/109. Will increase diltiazem to 60 mg TID.  Continue xarelto and coreg  Donato Heinz, MD

## 2021-04-14 NOTE — Care Management Important Message (Signed)
Important Message  Patient Details IM Letter given to the Patient. Name: Sheila Morgan MRN: [id number] Date of Birth: 11/15/1932   Medicare Important Message Given:  Yes     Kerin Salen 04/14/2021, 2:28 PM

## 2021-04-14 NOTE — Progress Notes (Signed)
PROGRESS NOTE    Sheila Morgan  OXB:[id number] DOB: 1933/02/01 DOA: 04/05/2021 PCP: Binnie Rail, MD   Brief Narrative: Sheila Morgan is a 87 y.o. female with medical history significant of type 2 diabetes, hypothyroidism, hypertension, CAD, CABG, prior history of COVID has taken COVID vaccinations and monoclonal antibodies family called EMS as patient was acting different.  Family watches her through the camera and noticed that she was acting different and confused.  When they went to check on her they found her very weak and confused with fever. Complained of cough. Family thought she had foul-smelling urine and increased urinary frequency. PO intake has been poor with decreased appetite. History was obtained from the patient's daughter as patient was confused and unable to provide any relevant information.  04/14/21: Patient was seen with her daughter at bedside.  HR uncontrolled.  She denies chest pain or palpitations.    Assessment & Plan:   Principal Problem:   COVID   Sepsis present on admission due to COVID PNA On admission patient was tachypneic and tachycardic with leukocytosis and fever of 101.2 Currently on room air, saturating well Chest x-ray unremarkable, repeat showing bibasilar atelectasis/infiltrate, completed azithromycin and Rocephin UA unremarkable for UTI, repeat UA showing large leukocytes, few bacteria, greater than 50 WBC Urine culture pending BC x2 NGTD Completed azithromycin and Rocephin Completed molnupiravir Continue Decadron, nebulizers, inhalers, cough suppressants as needed We will hold off on further antibiotics for now until urine culture pending.  If leukocytosis worsens may consider AB.   Paroxysmal A. fib with RVR TSH within normal limits Diltiazem changed to p.o. on 04/13/2021. Continue Coreg 25 mg twice daily On Xarelto for CVA prevention Monitor on telemetry  Type 2 diabetes with hyperglycemia, exacerbated by steroids. Hemoglobin  A1c 7.2 Continue SSI, glargine  Resolved AKI on CKD 3A Baseline creatinine appears to be 1.0 GFR 55 Creatinine peaked at 1.4 with GFR of 37 Avoid nephrotoxic agents Monitor urine output   History of hypertension BP stable Continue diltiazem 30 mg twice daily and Coreg 25 mg twice daily as recommended by cardiology. Continue to hold home Norvasc and Imdur for now  Hyperlipidemia: Continue on Zocor   Hypothyroidism: Continue on Synthroid   DVT on xarelto   Goals of care  Patient is full code Seen by physical therapy recommending SNF  Physical debility PT OT recommending SNF TOC assisting with disposition.   Estimated body mass index is 26.07 kg/m as calculated from the following:   Height as of this encounter: 5\' 4"  (1.626 m).   Weight as of this encounter: 68.9 kg.  DVT prophylaxis: Xarelto Code Status: Full code Family Communication: Discussed with daughter Disposition Plan:  Status is: Inpatient  Remains inpatient appropriate because: IV antibiotics    Consultants: Cardiology  Procedures: None Antimicrobials: Completed Rocephin, azithromycin and PaxLOVID   Objective: Vitals:   04/13/21 2207 04/14/21 0000 04/14/21 0400 04/14/21 1115  BP: (!) 140/92 132/80 (!) 145/93 (!) 143/109  Pulse:  (!) 119  70  Resp:  15 15 17   Temp:   97.8 F (36.6 C) 97.6 F (36.4 C)  TempSrc:   Oral Oral  SpO2:  98% 99% 97%  Weight:      Height:        Intake/Output Summary (Last 24 hours) at 04/14/2021 1654 Last data filed at 04/14/2021 0600 Gross per 24 hour  Intake --  Output 900 ml  Net -900 ml   Filed Weights   04/05/21 1531 04/05/21 2026  Weight: 71.2 kg 68.9 kg    Examination:  No significant changes from prior exam. General: Frail-appearing no acute distress.  She is alert and interactive.   Cardiovascular:irregular rate and rhythm no rubs or gallops. Respiratory: Clear to auscultation with no wheezes or rales.  Abdomen: Soft nontender normal bowel sounds  present.   Musculoskeletal: No lower extremity edema bilaterally. Skin: No ulcerative lesions noted. Psychiatry: Mood is appropriate for condition and setting.    Data Reviewed: I have personally reviewed following labs and imaging studies  CBC: Recent Labs  Lab 04/10/21 0522 04/11/21 0547 04/12/21 0535 04/13/21 0533 04/14/21 0451  WBC 9.3 11.8* 16.1* 18.1* 17.2*  NEUTROABS 8.1* 10.2* 14.0* 16.0* 14.8*  HGB 11.8* 11.4* 11.4* 11.5* 11.6*  HCT 35.1* 34.1* 34.4* 34.1* 36.4  MCV 90.2 90.2 91.0 90.9 92.4  PLT 173 192 223 258 858   Basic Metabolic Panel: Recent Labs  Lab 04/09/21 0751 04/10/21 0522 04/11/21 0547 04/12/21 0535 04/13/21 0533  NA 140 138 138 140 139  K 3.8 3.6 3.8 4.1 3.7  CL 108 109 108 109 107  CO2 23 21* 24 24 24   GLUCOSE 230* 251* 213* 199* 192*  BUN 34* 36* 43* 46* 46*  CREATININE 1.09* 1.00 1.40* 1.27* 1.24*  CALCIUM 9.1 9.3 9.2 9.4 9.3  MG  --  2.4  --   --   --    GFR: Estimated Creatinine Clearance: 31.1 mL/min (A) (by C-G formula based on SCr of 1.24 mg/dL (H)). Liver Function Tests: Recent Labs  Lab 04/08/21 0910  AST 27  ALT 21  ALKPHOS 48  BILITOT 0.5  PROT 6.4*  ALBUMIN 3.0*   No results for input(s): LIPASE, AMYLASE in the last 168 hours. No results for input(s): AMMONIA in the last 168 hours.  Coagulation Profile: No results for input(s): INR, PROTIME in the last 168 hours.  Cardiac Enzymes: No results for input(s): CKTOTAL, CKMB, CKMBINDEX, TROPONINI in the last 168 hours. BNP (last 3 results) No results for input(s): PROBNP in the last 8760 hours. HbA1C: No results for input(s): HGBA1C in the last 72 hours.  CBG: Recent Labs  Lab 04/13/21 1621 04/13/21 2050 04/14/21 0807 04/14/21 1112 04/14/21 1601  GLUCAP 245* 216* 156* 268* 188*   Lipid Profile: No results for input(s): CHOL, HDL, LDLCALC, TRIG, CHOLHDL, LDLDIRECT in the last 72 hours. Thyroid Function Tests: No results for input(s): TSH, T4TOTAL, FREET4,  T3FREE, THYROIDAB in the last 72 hours. Anemia Panel: No results for input(s): VITAMINB12, FOLATE, FERRITIN, TIBC, IRON, RETICCTPCT in the last 72 hours. Sepsis Labs: No results for input(s): PROCALCITON, LATICACIDVEN in the last 168 hours.   Recent Results (from the past 240 hour(s))  Blood Culture (routine x 2)     Status: None   Collection Time: 04/05/21  1:03 PM   Specimen: BLOOD  Result Value Ref Range Status   Specimen Description   Final    BLOOD BLOOD RIGHT FOREARM Performed at [facility] [address], [place], [place] [address]    Special Requests   Final    BOTTLES DRAWN AEROBIC AND ANAEROBIC Blood Culture results may not be optimal due to an inadequate volume of blood received in culture bottles Performed at [facility] [address], [place], [place] [address]    Culture   Final    NO GROWTH 5 DAYS Performed at [facility], [place] [address], [place], [place] [address]    Report Status 04/10/2021 FINAL  Final  Blood Culture (routine x 2)     Status: None   Collection Time: 04/05/21  1:03 PM   Specimen: BLOOD  Result Value Ref Range Status   Specimen Description   Final    BLOOD BLOOD LEFT WRIST Performed at [facility] [address], [place], [place] [address]    Special Requests   Final    BOTTLES DRAWN AEROBIC AND ANAEROBIC Blood Culture adequate volume Performed at [facility] [address], [place], [place] [address]    Culture   Final    NO GROWTH 5 DAYS Performed at [facility], [place] [address], [place], [place] [address]    Report Status 04/10/2021 FINAL  Final  Resp Panel by RT-PCR (Flu A&B, Covid)     Status: Abnormal   Collection Time: 04/05/21  1:03 PM   Specimen: Nasopharyngeal(NP) swabs in vial transport medium  Result Value Ref Range Status   SARS Coronavirus 2 by RT PCR POSITIVE (A) NEGATIVE Final    Comment: RESULT CALLED TO, READ BACK BY AND  VERIFIED WITH: GARRISON G. RN ON 04/05/2021 @ 1556 BY MECIAL J. (NOTE) SARS-CoV-2 target nucleic acids are DETECTED.  The SARS-CoV-2 RNA is generally detectable in upper respiratory specimens during the acute phase of infection. Positive results are indicative of the presence of the identified virus, but do not rule out bacterial infection or co-infection with other pathogens not detected by the test. Clinical correlation with patient history and other diagnostic information is necessary to determine patient infection status. The expected result is Negative.  Fact Sheet for Patients: EntrepreneurPulse.com.au  Fact Sheet for Healthcare Providers: IncredibleEmployment.be  This test is not yet approved or cleared by the [place] FDA and  has been authorized for detection and/or diagnosis of SARS-CoV-2 by FDA under an Emergency Use Authorization (EUA).  This EUA will remain in effect (meaning t his test can be used) for the duration of  the COVID-19 declaration under Section 564(b)(1) of the Act, 21 U.S.C. section 360bbb-3(b)(1), unless the authorization is terminated or revoked sooner.     Influenza A by PCR NEGATIVE NEGATIVE Final   Influenza B by PCR NEGATIVE NEGATIVE Final    Comment: (NOTE) The Xpert Xpress SARS-CoV-2/FLU/RSV plus assay is intended as an aid in the diagnosis of influenza from Nasopharyngeal swab specimens and should not be used as a sole basis for treatment. Nasal washings and aspirates are unacceptable for Xpert Xpress SARS-CoV-2/FLU/RSV testing.  Fact Sheet for Patients: EntrepreneurPulse.com.au  Fact Sheet for Healthcare Providers: IncredibleEmployment.be  This test is not yet approved or cleared by the [place] FDA and has been authorized for detection and/or diagnosis of SARS-CoV-2 by FDA under an Emergency Use Authorization (EUA). This EUA will remain in effect  (meaning this test can be used) for the duration of the COVID-19 declaration under Section 564(b)(1) of the Act, 21 U.S.C. section 360bbb-3(b)(1), unless the authorization is terminated or revoked.  Performed at [facility], [place] [address], [place], [place] [address]   Urine Culture     Status: Abnormal   Collection Time: 04/05/21  4:02 PM   Specimen: In/Out Cath Urine  Result Value Ref Range Status   Specimen Description   Final    IN/OUT CATH URINE Performed at [facility] [address] [place],  [address]    Special Requests   Final    NONE Performed at [facility], [place] [address]  [place], [place] [address]    Culture MULTIPLE SPECIES PRESENT, SUGGEST RECOLLECTION (A)  Final   Report Status 04/07/2021 FINAL  Final  MRSA Next Gen by PCR, Nasal     Status: None   Collection Time: 04/06/21  4:54 PM   Specimen: Nasal Mucosa; Nasal Swab  Result Value Ref Range Status   MRSA by PCR Next Gen NOT DETECTED NOT DETECTED Final    Comment: (NOTE) The GeneXpert MRSA Assay (FDA approved for NASAL specimens only), is one component of a comprehensive MRSA colonization surveillance program. It is not intended to diagnose MRSA infection nor to guide or monitor treatment for MRSA infections. Test performance is not FDA approved in patients less than 56 years old. Performed at [facility], [place] [address] [place], [place] [address]   Urine Culture     Status: Abnormal   Collection Time: 04/11/21 11:56 AM   Specimen: Urine, Clean Catch  Result Value Ref Range Status   Specimen Description   Final    URINE, CLEAN CATCH Performed at [facility], [place] [address] [place], [place] [address]    Special Requests   Final    NONE Performed at [facility], [place] [address], [place], [place] [address]    Culture 70,000 COLONIES/mL YEAST (A)  Final   Report Status  04/13/2021 FINAL  Final          Radiology Studies: No results found.      Scheduled Meds:  carvedilol  25 mg Oral BID WC   diltiazem  60 mg Oral Q8H   feeding supplement (GLUCERNA SHAKE)  237 mL Oral BID BM   insulin aspart  0-15 Units Subcutaneous TID WC   insulin aspart  0-5 Units Subcutaneous QHS   insulin glargine-yfgn  10 Units Subcutaneous BID   levothyroxine  50 mcg Oral Q0600   mouth rinse  15 mL Mouth Rinse BID   multivitamin with minerals  1 tablet Oral Daily   Ensure Max Protein  11 oz Oral Daily   rivaroxaban  15 mg Oral Q supper   Continuous Infusions:     LOS: 9 days   Kayleen Memos, MD 04/14/2021, 4:54 PM

## 2021-04-14 NOTE — TOC Progression Note (Addendum)
Transition of Care [facility]) - Progression Note    Patient Details  Name: Sheila Morgan MRN: [id number] Date of Birth: 1932-07-14  Transition [facility] CM/SW Contact  Lamiyah Schlotter, Juliann Pulse, RN Phone Number: 04/14/2021, 12:43 PM  Clinical Narrative: Damaris Schooner to dtr [place]-prefers CMto check into other SNF's also-[facility], in addition to [facility] accepted)-left message if can accept  covid quarantine ends 12/13.  1:58p-Left vm-dtr [name] of bed offers-updated-await choice from [facility]-await choice. 3:11p-[facility]-rep Lorenza Chick aware & able to accept;chosen.    Expected Discharge Plan: [place] Barriers to Discharge: Continued Medical Work up  Expected Discharge Plan and Services Expected Discharge Plan: [place]   Discharge Planning Services: CM Consult Post Acute Care Choice: [place] Living arrangements for the past 2 months: Single Family Home                                       Social Determinants of Health (SDOH) Interventions    Readmission Risk Interventions No flowsheet data found.

## 2021-04-15 LAB — CBC WITH DIFFERENTIAL/PLATELET
Abs Immature Granulocytes: 0.9 10*3/uL — ABNORMAL HIGH (ref 0.00–0.07)
Basophils Absolute: 0 10*3/uL (ref 0.0–0.1)
Basophils Relative: 0 %
Eosinophils Absolute: 0 10*3/uL (ref 0.0–0.5)
Eosinophils Relative: 0 %
HCT: 36.1 % (ref 36.0–46.0)
Hemoglobin: 12 g/dL (ref 12.0–15.0)
Immature Granulocytes: 5 %
Lymphocytes Relative: 4 %
Lymphs Abs: 0.7 10*3/uL (ref 0.7–4.0)
MCH: 31.2 pg (ref 26.0–34.0)
MCHC: 33.2 g/dL (ref 30.0–36.0)
MCV: 93.8 fL (ref 80.0–100.0)
Monocytes Absolute: 1.7 10*3/uL — ABNORMAL HIGH (ref 0.1–1.0)
Monocytes Relative: 9 %
Neutro Abs: 16.3 10*3/uL — ABNORMAL HIGH (ref 1.7–7.7)
Neutrophils Relative %: 82 %
Platelets: 277 10*3/uL (ref 150–400)
RBC: 3.85 MIL/uL — ABNORMAL LOW (ref 3.87–5.11)
RDW: 14.6 % (ref 11.5–15.5)
WBC: 19.7 10*3/uL — ABNORMAL HIGH (ref 4.0–10.5)
nRBC: 0.3 % — ABNORMAL HIGH (ref 0.0–0.2)

## 2021-04-15 LAB — GLUCOSE, CAPILLARY
Glucose-Capillary: 139 mg/dL — ABNORMAL HIGH (ref 70–99)
Glucose-Capillary: 210 mg/dL — ABNORMAL HIGH (ref 70–99)
Glucose-Capillary: 177 mg/dL — ABNORMAL HIGH (ref 70–99)
Glucose-Capillary: 92 mg/dL (ref 70–99)

## 2021-04-15 MED ORDER — DILTIAZEM HCL 60 MG PO TABS
60.0000 mg | ORAL_TABLET | Freq: Four times a day (QID) | ORAL | Status: DC
  Administered 2021-04-15 – 2021-04-16 (×3): 60 mg via ORAL
  Filled 2021-04-15 (×3): qty 1

## 2021-04-15 NOTE — Progress Notes (Signed)
Telemetry reviewed, in Afib with rate mildly elevated.  BP 144/93. Will increase diltiazem to 60 mg QID.  Continue xarelto and coreg  Donato Heinz, MD

## 2021-04-15 NOTE — Progress Notes (Addendum)
PROGRESS NOTE    Sheila Morgan  WJX:[id number] DOB: 1932-07-01 DOA: 04/05/2021 PCP: Binnie Rail, MD   Brief Narrative: Sheila Morgan is a 87 y.o. female with medical history significant of type 2 diabetes, hypothyroidism, hypertension, CAD, CABG, prior history of COVID has taken COVID vaccinations and monoclonal antibodies family called EMS as patient was acting different.  Family watches her through the camera and noticed that she was acting different and confused.  When they went to check on her they found her very weak and confused with fever. Complained of cough. Family thought she had foul-smelling urine and increased urinary frequency. PO intake has been poor with decreased appetite. History was obtained from the patient's daughter as patient was confused and unable to provide any relevant information.  04/15/21: Seen at bedside.  She has no new complaints.  Denies any palpitations or chest pain.  Heart rate is better controlled today.    Assessment & Plan:   Principal Problem:   COVID   Sepsis present on admission due to COVID PNA On admission patient was tachypneic and tachycardic with leukocytosis and fever of 101.2 Currently on room air, saturating well Chest x-ray unremarkable, repeat showing bibasilar atelectasis/infiltrate, completed azithromycin and Rocephin UA unremarkable for UTI, repeat UA showing large leukocytes, few bacteria, greater than 50 WBC Urine culture 04/11/2021, 70,000 colonies of yeast. BC x2 NGTD Completed azithromycin and Rocephin Completed molnupiravir Completed Decadron Continue bronchodilators, supportive care.  Persistent leukocytosis in the setting of steroids, likely reactive Afebrile Nonseptic appearing Repeat CBC in the morning Obtain procalcitonin level.  Paroxysmal A. fib with RVR TSH within normal limits Diltiazem changed to p.o. on 04/13/2021. She is currently on Coreg 25 mg twice daily, diltiazem p.o. 60 mg 3 times daily On  Xarelto for CVA prevention Continue to monitor on telemetry.  Type 2 diabetes with hyperglycemia, exacerbated by steroids. Hemoglobin A1c 7.2 Continue SSI, glargine  Resolved AKI on CKD 3A Baseline creatinine appears to be 1.0 GFR 55 Creatinine peaked at 1.4 with GFR of 37 Avoid nephrotoxic agents Monitor urine output   History of hypertension BP stable Continue diltiazem 30 mg twice daily and Coreg 25 mg twice daily as recommended by cardiology. Continue to hold home Norvasc and Imdur for now  Hyperlipidemia: Continue on Zocor   Hypothyroidism: Continue on Synthroid   DVT on xarelto   Goals of care  Patient is full code Seen by physical therapy recommending SNF  Physical debility/ambulatory dysfunction PT OT recommending SNF TOC consulted to assist with SNF placement Plan to DC to SNF in [place] area close to her daughter.   Estimated body mass index is 26.07 kg/m as calculated from the following:   Height as of this encounter: 5\' 4"  (1.626 m).   Weight as of this encounter: 68.9 kg.  DVT prophylaxis: Xarelto Code Status: Full code Family Communication: Discussed with daughter Disposition Plan:  Status is: Inpatient  Remains inpatient appropriate because: IV antibiotics    Consultants: Cardiology  Procedures: None Antimicrobials: Completed Rocephin, azithromycin and PaxLOVID   Objective: Vitals:   04/14/21 2152 04/14/21 2200 04/15/21 0423 04/15/21 0610  BP: (!) 142/97 (!) 143/98 (!) 143/88 (!) 144/93  Pulse: 77 (!) 56 77   Resp: 15 20 16    Temp: 97.8 F (36.6 C)  98.4 F (36.9 C)   TempSrc: Oral  Oral   SpO2: 100% 99% 100%   Weight:      Height:        Intake/Output Summary (Last  24 hours) at 04/15/2021 1502 Last data filed at 04/15/2021 0600 Gross per 24 hour  Intake 440 ml  Output 600 ml  Net -160 ml   Filed Weights   04/05/21 1531 04/05/21 2026  Weight: 71.2 kg 68.9 kg    Examination:   General: Frail-appearing no acute distress.   Alert and interactive.  Very hard of hearing.   Cardiovascular: Irregular rate and rhythm no rubs or gallops.  No JVD or thyromegaly noted. Respiratory: Clear to auscultation with no wheezes or rales.  Poor inspiratory effort. Abdomen: Soft nondistended nontender normal bowel sounds present. Musculoskeletal: Trace lower extremity edema bilaterally. Skin: No ulcerative lesions noted.   Psychiatry: Mood is appropriate for condition and setting.    Data Reviewed: I have personally reviewed following labs and imaging studies  CBC: Recent Labs  Lab 04/11/21 0547 04/12/21 0535 04/13/21 0533 04/14/21 0451 04/15/21 0456  WBC 11.8* 16.1* 18.1* 17.2* 19.7*  NEUTROABS 10.2* 14.0* 16.0* 14.8* 16.3*  HGB 11.4* 11.4* 11.5* 11.6* 12.0  HCT 34.1* 34.4* 34.1* 36.4 36.1  MCV 90.2 91.0 90.9 92.4 93.8  PLT 192 223 258 251 314   Basic Metabolic Panel: Recent Labs  Lab 04/09/21 0751 04/10/21 0522 04/11/21 0547 04/12/21 0535 04/13/21 0533  NA 140 138 138 140 139  K 3.8 3.6 3.8 4.1 3.7  CL 108 109 108 109 107  CO2 23 21* 24 24 24   GLUCOSE 230* 251* 213* 199* 192*  BUN 34* 36* 43* 46* 46*  CREATININE 1.09* 1.00 1.40* 1.27* 1.24*  CALCIUM 9.1 9.3 9.2 9.4 9.3  MG  --  2.4  --   --   --    GFR: Estimated Creatinine Clearance: 31.1 mL/min (A) (by C-G formula based on SCr of 1.24 mg/dL (H)). Liver Function Tests: No results for input(s): AST, ALT, ALKPHOS, BILITOT, PROT, ALBUMIN in the last 168 hours.  No results for input(s): LIPASE, AMYLASE in the last 168 hours. No results for input(s): AMMONIA in the last 168 hours.  Coagulation Profile: No results for input(s): INR, PROTIME in the last 168 hours.  Cardiac Enzymes: No results for input(s): CKTOTAL, CKMB, CKMBINDEX, TROPONINI in the last 168 hours. BNP (last 3 results) No results for input(s): PROBNP in the last 8760 hours. HbA1C: No results for input(s): HGBA1C in the last 72 hours.  CBG: Recent Labs  Lab 04/14/21 1112  04/14/21 1601 04/14/21 2150 04/15/21 0743 04/15/21 1130  GLUCAP 268* 188* 168* 139* 210*   Lipid Profile: No results for input(s): CHOL, HDL, LDLCALC, TRIG, CHOLHDL, LDLDIRECT in the last 72 hours. Thyroid Function Tests: No results for input(s): TSH, T4TOTAL, FREET4, T3FREE, THYROIDAB in the last 72 hours. Anemia Panel: No results for input(s): VITAMINB12, FOLATE, FERRITIN, TIBC, IRON, RETICCTPCT in the last 72 hours. Sepsis Labs: No results for input(s): PROCALCITON, LATICACIDVEN in the last 168 hours.   Recent Results (from the past 240 hour(s))  Urine Culture     Status: Abnormal   Collection Time: 04/05/21  4:02 PM   Specimen: In/Out Cath Urine  Result Value Ref Range Status   Specimen Description   Final    IN/OUT CATH URINE Performed at [facility] [address], [place], [place] [address]    Special Requests   Final    NONE Performed at [facility], [place] [address], [place], [place] [address]    Culture MULTIPLE SPECIES PRESENT, SUGGEST RECOLLECTION (A)  Final   Report Status 04/07/2021 FINAL  Final  MRSA  Next Gen by PCR, Nasal     Status: None   Collection Time: 04/06/21  4:54 PM   Specimen: Nasal Mucosa; Nasal Swab  Result Value Ref Range Status   MRSA by PCR Next Gen NOT DETECTED NOT DETECTED Final    Comment: (NOTE) The GeneXpert MRSA Assay (FDA approved for NASAL specimens only), is one component of a comprehensive MRSA colonization surveillance program. It is not intended to diagnose MRSA infection nor to guide or monitor treatment for MRSA infections. Test performance is not FDA approved in patients less than 39 years old. Performed at [facility], [place] [address], [place], [place] [address]   Urine Culture     Status: Abnormal   Collection Time: 04/11/21 11:56 AM   Specimen: Urine, Clean Catch  Result Value Ref Range Status   Specimen Description   Final    URINE, CLEAN CATCH Performed at  [facility], [place] [address], [place], [place] [address]    Special Requests   Final    NONE Performed at [facility], [place] [address], [place], [place] [address]    Culture 70,000 COLONIES/mL YEAST (A)  Final   Report Status 04/13/2021 FINAL  Final          Radiology Studies: No results found.      Scheduled Meds:  carvedilol  25 mg Oral BID WC   diltiazem  60 mg Oral Q8H   feeding supplement (GLUCERNA SHAKE)  237 mL Oral BID BM   insulin aspart  0-15 Units Subcutaneous TID WC   insulin aspart  0-5 Units Subcutaneous QHS   insulin glargine-yfgn  10 Units Subcutaneous BID   levothyroxine  50 mcg Oral Q0600   mouth rinse  15 mL Mouth Rinse BID   multivitamin with minerals  1 tablet Oral Daily   Ensure Max Protein  11 oz Oral Daily   rivaroxaban  15 mg Oral Q supper   Continuous Infusions:     LOS: 10 days   Kayleen Memos, MD 04/15/2021, 3:02 PM

## 2021-04-16 LAB — CBC WITH DIFFERENTIAL/PLATELET
Abs Immature Granulocytes: 0.92 10*3/uL — ABNORMAL HIGH (ref 0.00–0.07)
Basophils Absolute: 0.1 10*3/uL (ref 0.0–0.1)
Basophils Relative: 0 %
Eosinophils Absolute: 0.1 10*3/uL (ref 0.0–0.5)
Eosinophils Relative: 0 %
HCT: 35 % — ABNORMAL LOW (ref 36.0–46.0)
Hemoglobin: 11.3 g/dL — ABNORMAL LOW (ref 12.0–15.0)
Immature Granulocytes: 4 %
Lymphocytes Relative: 4 %
Lymphs Abs: 1 10*3/uL (ref 0.7–4.0)
MCH: 30.1 pg (ref 26.0–34.0)
MCHC: 32.3 g/dL (ref 30.0–36.0)
MCV: 93.3 fL (ref 80.0–100.0)
Monocytes Absolute: 2 10*3/uL — ABNORMAL HIGH (ref 0.1–1.0)
Monocytes Relative: 9 %
Neutro Abs: 17.5 10*3/uL — ABNORMAL HIGH (ref 1.7–7.7)
Neutrophils Relative %: 83 %
Platelets: 249 10*3/uL (ref 150–400)
RBC: 3.75 MIL/uL — ABNORMAL LOW (ref 3.87–5.11)
RDW: 15 % (ref 11.5–15.5)
WBC: 21.4 10*3/uL — ABNORMAL HIGH (ref 4.0–10.5)
nRBC: 0.1 % (ref 0.0–0.2)

## 2021-04-16 LAB — BASIC METABOLIC PANEL
Anion gap: 6 (ref 5–15)
BUN: 44 mg/dL — ABNORMAL HIGH (ref 8–23)
CO2: 26 mmol/L (ref 22–32)
Calcium: 8.9 mg/dL (ref 8.9–10.3)
Chloride: 110 mmol/L (ref 98–111)
Creatinine, Ser: 1.02 mg/dL — ABNORMAL HIGH (ref 0.44–1.00)
GFR, Estimated: 54 mL/min — ABNORMAL LOW (ref >60)
Glucose, Bld: 74 mg/dL (ref 70–99)
Potassium: 3.7 mmol/L (ref 3.5–5.1)
Sodium: 142 mmol/L (ref 135–145)

## 2021-04-16 LAB — PHOSPHORUS: Phosphorus: 2.8 mg/dL (ref 2.5–4.6)

## 2021-04-16 LAB — RESP PANEL BY RT-PCR (FLU A&B, COVID) ARPGX2
Influenza A by PCR: NEGATIVE
Influenza B by PCR: NEGATIVE
SARS Coronavirus 2 by RT PCR: POSITIVE — AB

## 2021-04-16 LAB — GLUCOSE, CAPILLARY
Glucose-Capillary: 54 mg/dL — ABNORMAL LOW (ref 70–99)
Glucose-Capillary: 68 mg/dL — ABNORMAL LOW (ref 70–99)
Glucose-Capillary: 152 mg/dL — ABNORMAL HIGH (ref 70–99)
Glucose-Capillary: 152 mg/dL — ABNORMAL HIGH (ref 70–99)
Glucose-Capillary: 161 mg/dL — ABNORMAL HIGH (ref 70–99)

## 2021-04-16 LAB — MAGNESIUM: Magnesium: 2.4 mg/dL (ref 1.7–2.4)

## 2021-04-16 LAB — PROCALCITONIN: Procalcitonin: 0.2 ng/mL

## 2021-04-16 MED ORDER — DILTIAZEM HCL ER COATED BEADS 120 MG PO CP24
120.0000 mg | ORAL_CAPSULE | Freq: Every day | ORAL | Status: DC
  Administered 2021-04-17: 120 mg via ORAL
  Filled 2021-04-16: qty 1

## 2021-04-16 MED ORDER — INSULIN ASPART 100 UNIT/ML IJ SOLN
0.0000 [IU] | Freq: Three times a day (TID) | INTRAMUSCULAR | Status: DC
  Administered 2021-04-16: 18:00:00 2 [IU] via SUBCUTANEOUS

## 2021-04-16 MED ORDER — INSULIN GLARGINE-YFGN 100 UNIT/ML ~~LOC~~ SOLN
5.0000 [IU] | Freq: Every day | SUBCUTANEOUS | Status: DC
  Administered 2021-04-16: 21:00:00 5 [IU] via SUBCUTANEOUS
  Filled 2021-04-16 (×2): qty 0.05

## 2021-04-16 NOTE — Progress Notes (Addendum)
Physical Therapy Treatment Patient Details Name: Sheila Morgan MRN: [id number] DOB: 01/22/1933 Today's Date: 04/16/2021   History of Present Illness 87 yo female admitted with confusion, sepsis, COVID. Hx of CAD, OA, gout, hearing loss, R shoulder radiculopathy, DM, CABG, shingles, MI, falls    PT Comments    Attempted sit to stand with RW x 3 trials, pt unable to come to full upright position with max assist. Performed bear hug squat pivot transfer to recliner with max assist. HR 90s with activity, SpO2 greater than 90% on RA with activity. Recommending +2 assist or use of Stedy for transfers with nursing, NT notified.    Recommendations for follow up therapy are one component of a multi-disciplinary discharge planning process, led by the attending physician.  Recommendations may be updated based on patient status, additional functional criteria and insurance authorization.  Follow Up Recommendations  Skilled nursing-short term rehab (<3 hours/day)     Assistance Recommended at Discharge Frequent or constant Supervision/Assistance  Equipment Recommendations  None recommended by PT    Recommendations for Other Services       Precautions / Restrictions Precautions Precautions: Fall Precaution Comments: monitor HR, sats stable on RA Restrictions Weight Bearing Restrictions: No     Mobility  Bed Mobility Overal bed mobility: Needs Assistance Bed Mobility: Supine to Sit     Supine to sit: Min assist;HOB elevated     General bed mobility comments: min A to raise trunk, used bedrail    Transfers Overall transfer level: Needs assistance Equipment used: Rolling walker (2 wheels) Transfers: Sit to/from Stand;Bed to chair/wheelchair/BSC Sit to Stand: Max assist;From elevated surface   Squat pivot transfers: Max assist       General transfer comment: sit to stand with RW x 3 attempts from elevated bed, pt unable to come to full upright position, then switched to bear  hug technique for SPT to recliner, HR 90s with activity, SpO2 greater than 90% on RA with activity    Ambulation/Gait                   Stairs             Wheelchair Mobility    Modified Rankin (Stroke Patients Only)       Balance Overall balance assessment: Needs assistance Sitting-balance support: No upper extremity supported;Feet supported Sitting balance-Leahy Scale: Good     Standing balance support: Bilateral upper extremity supported;Reliant on assistive device for balance Standing balance-Leahy Scale: Poor                              Cognition Arousal/Alertness: Awake/alert Behavior During Therapy: WFL for tasks assessed/performed Overall Cognitive Status: History of cognitive impairments - at baseline Area of Impairment: Memory;Problem solving                     Memory: Decreased short-term memory       Problem Solving: Requires verbal cues          Exercises General Exercises - Lower Extremity Ankle Circles/Pumps: AROM;Both;15 reps;Seated    General Comments        Pertinent Vitals/Pain Pain Assessment: No/denies pain    Home Living                          Prior Function            PT Goals (current goals can  now be found in the care plan section) Acute Rehab PT Goals Patient Stated Goal: to get better PT Goal Formulation: With patient Time For Goal Achievement: 04/21/21 Potential to Achieve Goals: Good Progress towards PT goals: Progressing toward goals    Frequency    Min 2X/week      PT Plan Current plan remains appropriate    Co-evaluation              AM-PAC PT "6 Clicks" Mobility   Outcome Measure  Help needed turning from your back to your side while in a flat bed without using bedrails?: A Lot Help needed moving from lying on your back to sitting on the side of a flat bed without using bedrails?: A Lot Help needed moving to and from a bed to a chair (including a  wheelchair)?: A Lot Help needed standing up from a chair using your arms (e.g., wheelchair or bedside chair)?: A Lot Help needed to walk in hospital room?: Total Help needed climbing 3-5 steps with a railing? : Total 6 Click Score: 10    End of Session Equipment Utilized During Treatment: Gait belt Activity Tolerance: Patient limited by fatigue;Patient tolerated treatment well Patient left: in chair;with call bell/phone within reach;with chair alarm set Nurse Communication: Mobility status PT Visit Diagnosis: History of falling (Z91.81);Unsteadiness on feet (R26.81);Muscle weakness (generalized) (M62.81);Difficulty in walking, not elsewhere classified (R26.2)     Time: 2440-1027 PT Time Calculation (min) (ACUTE ONLY): 20 min  Charges:  $Therapeutic Activity: 8-22 mins                     Blondell Reveal Kistler PT 04/16/2021  Acute Rehabilitation Services Pager [phone] Office [phone]

## 2021-04-16 NOTE — Progress Notes (Signed)
Nutrition Follow-up  DOCUMENTATION CODES:   Not applicable  INTERVENTION:  - continue Glucerna Shake BID and Ensure Max once/day.  - weigh patient today.   NUTRITION DIAGNOSIS:   Increased nutrient needs related to acute illness, catabolic illness (CLEXN-17 infection) as evidenced by estimated needs. -ongoing  GOAL:   Patient will meet greater than or equal to 90% of their needs -minimally met to unmet on average  MONITOR:   PO intake, Supplement acceptance, Labs, Weight trends   ASSESSMENT:   87 y.o. female with medical history of type 2 DM, hypothyroidism, HTN, CAD, CABG, prior history of COVID has taken COVID vaccinations and monoclonal antibodies. Her family called EMS due to patient acting different than her normal and due to severe weakness. Family reported that she had a poor appetite PTA.   She has been accepting Ensure Max 100% of the time offered and Glucerna Shake 90% of the time offered.   The most recently documented meal intakes were 35% of breakfast and lunch on 12/8 (total of 514 kcal, 16 grams protein) and 35% of breakfast, 75% of lunch, and 20% of dinner on 12/12 (total of 926 kcal and 39 grams protein).  She has not been weighed since admission on 12/3. Non-pitting edema to BLE documented in the edema section of flow sheet.    MD note from yesterday states that patient remains inpatient appropriate due to IV abx. Hopeful plan for d/c is to SNF in [place].   She remains on precautions for COVID-19 at this time.    Labs reviewed; CBGs: 54 and 68 mg/dl, BUN: 44 mg/dl, creatinine: 1.02 mg/dl,   Medications reviewed; sliding scale novolog, 5 units semglee/day, 50 mcg oral synthroid/day, 1 tablet multivitamin with minerals/day.   Diet Order:   Diet Order             DIET DYS 3 Room service appropriate? Yes; Fluid consistency: Thin  Diet effective now                   EDUCATION NEEDS:   Not appropriate for education at this time  Skin:   Skin Assessment: Reviewed RN Assessment  Last BM:  12/12 (type 6)  Height:   Ht Readings from Last 1 Encounters:  04/05/21 5' 4"  (1.626 m)    Weight:   Wt Readings from Last 1 Encounters:  04/05/21 68.9 kg     Estimated Nutritional Needs:  Kcal:  1700-1900 kcal Protein:  85-100 grams Fluid:  >/= 2 L/day     Jarome Matin, MS, RD, LDN, CNSC Inpatient Clinical Dietitian RD pager # available in [facility]  After hours/weekend pager # available in [facility]

## 2021-04-16 NOTE — Progress Notes (Signed)
PROGRESS NOTE    Sheila Morgan  KCL:[id number] DOB: June 26, 1932 DOA: 04/05/2021 PCP: Binnie Rail, MD   Brief Narrative: Sheila Morgan is a 87 y.o. female with medical history significant of type 2 diabetes, hypothyroidism, hypertension, CAD, CABG, prior history of COVID has taken COVID vaccinations and monoclonal antibodies family called EMS as patient was acting different.  Family watches her through the camera and noticed that she was acting different and confused.  When they went to check on her they found her very weak and confused with fever. Complained of cough. Family thought she had foul-smelling urine and increased urinary frequency. PO intake has been poor with decreased appetite. History was obtained from the patient's daughter as patient was confused and unable to provide any relevant information.  04/16/21: Seen at her bedside.  No new complaints.  Ongoing adjustment of cardiac medications due to A. fib with RVR.  Cardizem dose consolidated by cardiology, signed off.  Closely monitor BP and heart rate on new dose prior to discharge.  Anticipated discharge to [facility] 04/17/21.  Assessment & Plan:   Principal Problem:   COVID   Sepsis present on admission due to COVID PNA On admission patient was tachypneic and tachycardic with leukocytosis and fever of 101.2 Currently on room air, saturating well Chest x-ray unremarkable, repeat showing bibasilar atelectasis/infiltrate, completed azithromycin and Rocephin UA unremarkable for UTI, repeat UA showing large leukocytes, few bacteria, greater than 50 WBC Urine culture 04/11/2021, 70,000 colonies of yeast. BC x2 NGTD Completed azithromycin and Rocephin Completed molnupiravir Completed Decadron Continue bronchodilators, supportive care.  Persistent leukocytosis in the setting of steroids, likely reactive Afebrile Procalcitonin negative Suspect reactive in the setting of recent IV steroids Nonseptic appearing  Paroxysmal A.  fib with RVR TSH within normal limits Diltiazem changed to p.o. on 04/13/2021. P.o. Cardizem dose consolidated to 120 mg daily by cardiology.  Closely monitor heart rate and blood pressure while on this dose. Continue Coreg 25 mg twice daily Continue to closely monitor on telemetry Continue Xarelto for CVA prevention.  Type 2 diabetes with hyperglycemia, exacerbated by steroids. Hemoglobin A1c 7.2 Continue SSI, glargine  Resolved AKI on CKD 3A Baseline creatinine appears to be 1.0 GFR 55 Creatinine peaked at 1.4 with GFR of 37 Avoid nephrotoxic agents Monitor urine output   History of hypertension BP stable Management as stated above per cardiology. Continue to hold home Norvasc and Imdur for now  Hyperlipidemia: Continue on Zocor   Hypothyroidism: Continue on Synthroid   DVT on xarelto   Goals of care  Patient is full code Seen by physical therapy recommending SNF  Physical debility/ambulatory dysfunction PT OT recommending SNF TOC consulted to assist with SNF placement Plan to DC to SNF in [place] area close to her daughter.   Estimated body mass index is 26.07 kg/m as calculated from the following:   Height as of this encounter: 5\' 4"  (1.626 m).   Weight as of this encounter: 68.9 kg.  DVT prophylaxis: Xarelto Code Status: Full code Family Communication: Discussed with daughter Disposition Plan:  Status is: Inpatient  Remains inpatient appropriate because: IV antibiotics    Consultants: Cardiology, signed off.  Procedures: None Antimicrobials: Completed Rocephin, azithromycin and PaxLOVID   Objective: Vitals:   04/16/21 0800 04/16/21 0900 04/16/21 1000 04/16/21 1200  BP:  (P) 118/81  (!) 125/95  Pulse: (!) 49 94  (!) 131  Resp: 16 18 16  (!) 23  Temp:  (!) (P) 97.4 F (36.3 C)  98 F (  36.7 C)  TempSrc:  (P) Oral  Oral  SpO2: 98%   100%  Weight:      Height:        Intake/Output Summary (Last 24 hours) at 04/16/2021 1523 Last data filed at  04/16/2021 1220 Gross per 24 hour  Intake 540 ml  Output 1650 ml  Net -1110 ml   Filed Weights   04/05/21 1531 04/05/21 2026  Weight: 71.2 kg 68.9 kg    Examination:   General: Well-appearing no acute distress.  She is alert and interactive.  Very hard of hearing. Cardiovascular: Irregular rate and rhythm no rubs or gallops. Respiratory: Clear to station with no wheezes or rales Abdomen: Soft nontender no bowel sounds present  musculoskeletal: Trace lower extremity edema bilaterally Skin: No ulcerative lesions noted. Psychiatry: Mood is appropriate for condition and setting.    Data Reviewed: I have personally reviewed following labs and imaging studies  CBC: Recent Labs  Lab 04/12/21 0535 04/13/21 0533 04/14/21 0451 04/15/21 0456 04/16/21 0452  WBC 16.1* 18.1* 17.2* 19.7* 21.4*  NEUTROABS 14.0* 16.0* 14.8* 16.3* 17.5*  HGB 11.4* 11.5* 11.6* 12.0 11.3*  HCT 34.4* 34.1* 36.4 36.1 35.0*  MCV 91.0 90.9 92.4 93.8 93.3  PLT 223 258 251 277 119   Basic Metabolic Panel: Recent Labs  Lab 04/10/21 0522 04/11/21 0547 04/12/21 0535 04/13/21 0533 04/16/21 0452  NA 138 138 140 139 142  K 3.6 3.8 4.1 3.7 3.7  CL 109 108 109 107 110  CO2 21* 24 24 24 26   GLUCOSE 251* 213* 199* 192* 74  BUN 36* 43* 46* 46* 44*  CREATININE 1.00 1.40* 1.27* 1.24* 1.02*  CALCIUM 9.3 9.2 9.4 9.3 8.9  MG 2.4  --   --   --  2.4  PHOS  --   --   --   --  2.8   GFR: Estimated Creatinine Clearance: 37.8 mL/min (A) (by C-G formula based on SCr of 1.02 mg/dL (H)). Liver Function Tests: No results for input(s): AST, ALT, ALKPHOS, BILITOT, PROT, ALBUMIN in the last 168 hours.  No results for input(s): LIPASE, AMYLASE in the last 168 hours. No results for input(s): AMMONIA in the last 168 hours.  Coagulation Profile: No results for input(s): INR, PROTIME in the last 168 hours.  Cardiac Enzymes: No results for input(s): CKTOTAL, CKMB, CKMBINDEX, TROPONINI in the last 168 hours. BNP (last 3  results) No results for input(s): PROBNP in the last 8760 hours. HbA1C: No results for input(s): HGBA1C in the last 72 hours.  CBG: Recent Labs  Lab 04/15/21 1732 04/15/21 2123 04/16/21 0744 04/16/21 0759 04/16/21 1212  GLUCAP 177* 92 54* 68* 152*   Lipid Profile: No results for input(s): CHOL, HDL, LDLCALC, TRIG, CHOLHDL, LDLDIRECT in the last 72 hours. Thyroid Function Tests: No results for input(s): TSH, T4TOTAL, FREET4, T3FREE, THYROIDAB in the last 72 hours. Anemia Panel: No results for input(s): VITAMINB12, FOLATE, FERRITIN, TIBC, IRON, RETICCTPCT in the last 72 hours. Sepsis Labs: Recent Labs  Lab 04/16/21 0452  PROCALCITON 0.20     Recent Results (from the past 240 hour(s))  MRSA Next Gen by PCR, Nasal     Status: None   Collection Time: 04/06/21  4:54 PM   Specimen: Nasal Mucosa; Nasal Swab  Result Value Ref Range Status   MRSA by PCR Next Gen NOT DETECTED NOT DETECTED Final    Comment: (NOTE) The GeneXpert MRSA Assay (FDA approved for NASAL specimens only), is one component of a comprehensive MRSA  colonization surveillance program. It is not intended to diagnose MRSA infection nor to guide or monitor treatment for MRSA infections. Test performance is not FDA approved in patients less than 33 years old. Performed at [facility], [place] [address] [place], [place] [address]   Urine Culture     Status: Abnormal   Collection Time: 04/11/21 11:56 AM   Specimen: Urine, Clean Catch  Result Value Ref Range Status   Specimen Description   Final    URINE, CLEAN CATCH Performed at [facility], [place] [address] [place], [place] [address]    Special Requests   Final    NONE Performed at [facility], [place] [address], [place], [place] [address]    Culture 70,000 COLONIES/mL YEAST (A)  Final   Report Status 04/13/2021 FINAL  Final  Resp Panel by RT-PCR (Flu A&B, Covid) Nasopharyngeal Swab     Status: Abnormal    Collection Time: 04/16/21  5:45 AM   Specimen: Nasopharyngeal Swab; Nasopharyngeal(NP) swabs in vial transport medium  Result Value Ref Range Status   SARS Coronavirus 2 by RT PCR POSITIVE (A) NEGATIVE Final    Comment: (NOTE) SARS-CoV-2 target nucleic acids are DETECTED.  The SARS-CoV-2 RNA is generally detectable in upper respiratory specimens during the acute phase of infection. Positive results are indicative of the presence of the identified virus, but do not rule out bacterial infection or co-infection with other pathogens not detected by the test. Clinical correlation with patient history and other diagnostic information is necessary to determine patient infection status. The expected result is Negative.  Fact Sheet for Patients: EntrepreneurPulse.com.au  Fact Sheet for Healthcare Providers: IncredibleEmployment.be  This test is not yet approved or cleared by the [place] FDA and  has been authorized for detection and/or diagnosis of SARS-CoV-2 by FDA under an Emergency Use Authorization (EUA).  This EUA will remain in effect (meaning this test can be used) for the duration of  the COVID-19 declaration under Section 564(b)(1) of the A ct, 21 U.S.C. section 360bbb-3(b)(1), unless the authorization is terminated or revoked sooner.     Influenza A by PCR NEGATIVE NEGATIVE Final   Influenza B by PCR NEGATIVE NEGATIVE Final    Comment: (NOTE) The Xpert Xpress SARS-CoV-2/FLU/RSV plus assay is intended as an aid in the diagnosis of influenza from Nasopharyngeal swab specimens and should not be used as a sole basis for treatment. Nasal washings and aspirates are unacceptable for Xpert Xpress SARS-CoV-2/FLU/RSV testing.  Fact Sheet for Patients: EntrepreneurPulse.com.au  Fact Sheet for Healthcare Providers: IncredibleEmployment.be  This test is not yet approved or cleared by the [place] FDA  and has been authorized for detection and/or diagnosis of SARS-CoV-2 by FDA under an Emergency Use Authorization (EUA). This EUA will remain in effect (meaning this test can be used) for the duration of the COVID-19 declaration under Section 564(b)(1) of the Act, 21 U.S.C. section 360bbb-3(b)(1), unless the authorization is terminated or revoked.  Performed at [facility], [place] [address] [place], [place] [address]           Radiology Studies: No results found.      Scheduled Meds:  carvedilol  25 mg Oral BID WC   [START ON 04/17/2021] diltiazem  120 mg Oral Daily   feeding supplement (GLUCERNA SHAKE)  237 mL Oral BID BM   insulin aspart  0-9 Units Subcutaneous TID WC   insulin glargine-yfgn  5 Units Subcutaneous QHS   levothyroxine  50 mcg Oral Q0600   mouth rinse  15 mL Mouth Rinse BID   multivitamin with minerals  1 tablet Oral Daily   Ensure Max Protein  11 oz Oral Daily   rivaroxaban  15 mg Oral Q supper   Continuous Infusions:     LOS: 11 days   Kayleen Memos, MD 04/16/2021, 3:23 PM

## 2021-04-16 NOTE — Progress Notes (Signed)
Converted to sinus rhythm, rate 70s.  Would continue coreg and will consolidate diltiazem dose.  Continue Xarelto.  Follow-up scheduled for 06/04/21. We will sign off, please call if can be of assistance.    Donato Heinz, MD

## 2021-04-16 NOTE — Progress Notes (Signed)
Inpatient Diabetes Program Recommendations  AACE/ADA: New Consensus Statement on Inpatient Glycemic Control (2015)  Target Ranges:  Prepandial:   less than 140 mg/dL      Peak postprandial:   less than 180 mg/dL (1-2 hours)      Critically ill patients:  140 - 180 mg/dL   Lab Results  Component Value Date   GLUCAP 68 (L) 04/16/2021   HGBA1C 7.2 (H) 04/05/2021    Review of Glycemic Control  Latest Reference Range & Units 04/15/21 07:43 04/15/21 11:30 04/15/21 17:32 04/15/21 21:23 04/16/21 07:44 04/16/21 07:59  Glucose-Capillary 70 - 99 mg/dL 139 (H) 210 (H) 177 (H) 92 54 (L) 68 (L)   Diabetes history: DM 2 Outpatient Diabetes medications: Semglee 4 units qhs, Novolog 0-6 units tid with meals Current orders for Inpatient glycemic control:  Semglee 10 units bid Novolog 0-15 units tid + hs  Inpatient Diabetes Program Recommendations:    Hypoglycemia 54 this am, no longer on decadron -  Decrease Semglee to 5 units (takes 4 units at home of basal insulin) -  Decrease Novolog Correction to 0-9 units tid  Thanks,  .shan

## 2021-04-17 DIAGNOSIS — R1311 Dysphagia, oral phase: Secondary | ICD-10-CM | POA: Diagnosis not present

## 2021-04-17 DIAGNOSIS — D6859 Other primary thrombophilia: Secondary | ICD-10-CM | POA: Diagnosis not present

## 2021-04-17 DIAGNOSIS — I517 Cardiomegaly: Secondary | ICD-10-CM | POA: Diagnosis not present

## 2021-04-17 DIAGNOSIS — E039 Hypothyroidism, unspecified: Secondary | ICD-10-CM | POA: Diagnosis present

## 2021-04-17 DIAGNOSIS — J1282 Pneumonia due to coronavirus disease 2019: Secondary | ICD-10-CM | POA: Diagnosis not present

## 2021-04-17 DIAGNOSIS — I252 Old myocardial infarction: Secondary | ICD-10-CM | POA: Diagnosis not present

## 2021-04-17 DIAGNOSIS — G9340 Encephalopathy, unspecified: Secondary | ICD-10-CM | POA: Diagnosis not present

## 2021-04-17 DIAGNOSIS — R404 Transient alteration of awareness: Secondary | ICD-10-CM | POA: Diagnosis not present

## 2021-04-17 DIAGNOSIS — D649 Anemia, unspecified: Secondary | ICD-10-CM | POA: Diagnosis not present

## 2021-04-17 DIAGNOSIS — R6510 Systemic inflammatory response syndrome (SIRS) of non-infectious origin without acute organ dysfunction: Secondary | ICD-10-CM | POA: Diagnosis present

## 2021-04-17 DIAGNOSIS — R9431 Abnormal electrocardiogram [ECG] [EKG]: Secondary | ICD-10-CM | POA: Diagnosis not present

## 2021-04-17 DIAGNOSIS — R2689 Other abnormalities of gait and mobility: Secondary | ICD-10-CM | POA: Diagnosis not present

## 2021-04-17 DIAGNOSIS — I130 Hypertensive heart and chronic kidney disease with heart failure and stage 1 through stage 4 chronic kidney disease, or unspecified chronic kidney disease: Secondary | ICD-10-CM | POA: Diagnosis present

## 2021-04-17 DIAGNOSIS — R262 Difficulty in walking, not elsewhere classified: Secondary | ICD-10-CM | POA: Diagnosis not present

## 2021-04-17 DIAGNOSIS — R1312 Dysphagia, oropharyngeal phase: Secondary | ICD-10-CM | POA: Diagnosis not present

## 2021-04-17 DIAGNOSIS — J9621 Acute and chronic respiratory failure with hypoxia: Secondary | ICD-10-CM | POA: Diagnosis present

## 2021-04-17 DIAGNOSIS — N179 Acute kidney failure, unspecified: Secondary | ICD-10-CM | POA: Diagnosis present

## 2021-04-17 DIAGNOSIS — I5033 Acute on chronic diastolic (congestive) heart failure: Secondary | ICD-10-CM | POA: Diagnosis present

## 2021-04-17 DIAGNOSIS — I480 Paroxysmal atrial fibrillation: Secondary | ICD-10-CM | POA: Diagnosis present

## 2021-04-17 DIAGNOSIS — D72829 Elevated white blood cell count, unspecified: Secondary | ICD-10-CM | POA: Diagnosis not present

## 2021-04-17 DIAGNOSIS — U071 COVID-19: Secondary | ICD-10-CM | POA: Diagnosis not present

## 2021-04-17 DIAGNOSIS — M353 Polymyalgia rheumatica: Secondary | ICD-10-CM | POA: Diagnosis not present

## 2021-04-17 DIAGNOSIS — I482 Chronic atrial fibrillation, unspecified: Secondary | ICD-10-CM | POA: Diagnosis present

## 2021-04-17 DIAGNOSIS — R6520 Severe sepsis without septic shock: Secondary | ICD-10-CM | POA: Diagnosis not present

## 2021-04-17 DIAGNOSIS — J189 Pneumonia, unspecified organism: Secondary | ICD-10-CM | POA: Diagnosis not present

## 2021-04-17 DIAGNOSIS — Z85828 Personal history of other malignant neoplasm of skin: Secondary | ICD-10-CM | POA: Diagnosis not present

## 2021-04-17 DIAGNOSIS — E785 Hyperlipidemia, unspecified: Secondary | ICD-10-CM | POA: Diagnosis present

## 2021-04-17 DIAGNOSIS — D849 Immunodeficiency, unspecified: Secondary | ICD-10-CM | POA: Diagnosis present

## 2021-04-17 DIAGNOSIS — I959 Hypotension, unspecified: Secondary | ICD-10-CM | POA: Diagnosis not present

## 2021-04-17 DIAGNOSIS — U099 Post covid-19 condition, unspecified: Secondary | ICD-10-CM | POA: Diagnosis not present

## 2021-04-17 DIAGNOSIS — Z86718 Personal history of other venous thrombosis and embolism: Secondary | ICD-10-CM | POA: Diagnosis not present

## 2021-04-17 DIAGNOSIS — R4182 Altered mental status, unspecified: Secondary | ICD-10-CM | POA: Diagnosis not present

## 2021-04-17 DIAGNOSIS — R5381 Other malaise: Secondary | ICD-10-CM | POA: Diagnosis not present

## 2021-04-17 DIAGNOSIS — J9601 Acute respiratory failure with hypoxia: Secondary | ICD-10-CM | POA: Diagnosis not present

## 2021-04-17 DIAGNOSIS — I152 Hypertension secondary to endocrine disorders: Secondary | ICD-10-CM | POA: Diagnosis not present

## 2021-04-17 DIAGNOSIS — M109 Gout, unspecified: Secondary | ICD-10-CM | POA: Diagnosis present

## 2021-04-17 DIAGNOSIS — I2510 Atherosclerotic heart disease of native coronary artery without angina pectoris: Secondary | ICD-10-CM | POA: Diagnosis present

## 2021-04-17 DIAGNOSIS — J90 Pleural effusion, not elsewhere classified: Secondary | ICD-10-CM | POA: Diagnosis not present

## 2021-04-17 DIAGNOSIS — N183 Chronic kidney disease, stage 3 unspecified: Secondary | ICD-10-CM | POA: Diagnosis not present

## 2021-04-17 DIAGNOSIS — D631 Anemia in chronic kidney disease: Secondary | ICD-10-CM | POA: Diagnosis present

## 2021-04-17 DIAGNOSIS — J449 Chronic obstructive pulmonary disease, unspecified: Secondary | ICD-10-CM | POA: Diagnosis not present

## 2021-04-17 DIAGNOSIS — E1151 Type 2 diabetes mellitus with diabetic peripheral angiopathy without gangrene: Secondary | ICD-10-CM | POA: Diagnosis present

## 2021-04-17 DIAGNOSIS — R41841 Cognitive communication deficit: Secondary | ICD-10-CM | POA: Diagnosis not present

## 2021-04-17 DIAGNOSIS — D689 Coagulation defect, unspecified: Secondary | ICD-10-CM | POA: Diagnosis present

## 2021-04-17 DIAGNOSIS — R278 Other lack of coordination: Secondary | ICD-10-CM | POA: Diagnosis not present

## 2021-04-17 DIAGNOSIS — R0689 Other abnormalities of breathing: Secondary | ICD-10-CM | POA: Diagnosis not present

## 2021-04-17 DIAGNOSIS — A419 Sepsis, unspecified organism: Secondary | ICD-10-CM | POA: Diagnosis not present

## 2021-04-17 DIAGNOSIS — M6259 Muscle wasting and atrophy, not elsewhere classified, multiple sites: Secondary | ICD-10-CM | POA: Diagnosis not present

## 2021-04-17 DIAGNOSIS — G9341 Metabolic encephalopathy: Secondary | ICD-10-CM | POA: Diagnosis present

## 2021-04-17 DIAGNOSIS — M6281 Muscle weakness (generalized): Secondary | ICD-10-CM | POA: Diagnosis not present

## 2021-04-17 DIAGNOSIS — R0902 Hypoxemia: Secondary | ICD-10-CM | POA: Diagnosis not present

## 2021-04-17 DIAGNOSIS — E1122 Type 2 diabetes mellitus with diabetic chronic kidney disease: Secondary | ICD-10-CM | POA: Diagnosis present

## 2021-04-17 DIAGNOSIS — I10 Essential (primary) hypertension: Secondary | ICD-10-CM | POA: Diagnosis not present

## 2021-04-17 DIAGNOSIS — Z743 Need for continuous supervision: Secondary | ICD-10-CM | POA: Diagnosis not present

## 2021-04-17 DIAGNOSIS — I4891 Unspecified atrial fibrillation: Secondary | ICD-10-CM | POA: Diagnosis not present

## 2021-04-17 DIAGNOSIS — E1159 Type 2 diabetes mellitus with other circulatory complications: Secondary | ICD-10-CM | POA: Diagnosis not present

## 2021-04-17 DIAGNOSIS — Z951 Presence of aortocoronary bypass graft: Secondary | ICD-10-CM | POA: Diagnosis not present

## 2021-04-17 DIAGNOSIS — N1831 Chronic kidney disease, stage 3a: Secondary | ICD-10-CM | POA: Diagnosis present

## 2021-04-17 DIAGNOSIS — L89150 Pressure ulcer of sacral region, unstageable: Secondary | ICD-10-CM | POA: Diagnosis present

## 2021-04-17 LAB — CBC WITH DIFFERENTIAL/PLATELET
Abs Immature Granulocytes: 0.52 10*3/uL — ABNORMAL HIGH (ref 0.00–0.07)
Basophils Absolute: 0 10*3/uL (ref 0.0–0.1)
Basophils Relative: 0 %
Eosinophils Absolute: 0.1 10*3/uL (ref 0.0–0.5)
Eosinophils Relative: 1 %
HCT: 36.8 % (ref 36.0–46.0)
Hemoglobin: 12 g/dL (ref 12.0–15.0)
Immature Granulocytes: 3 %
Lymphocytes Relative: 5 %
Lymphs Abs: 1 10*3/uL (ref 0.7–4.0)
MCH: 30.5 pg (ref 26.0–34.0)
MCHC: 32.6 g/dL (ref 30.0–36.0)
MCV: 93.6 fL (ref 80.0–100.0)
Monocytes Absolute: 1.6 10*3/uL — ABNORMAL HIGH (ref 0.1–1.0)
Monocytes Relative: 8 %
Neutro Abs: 17.2 10*3/uL — ABNORMAL HIGH (ref 1.7–7.7)
Neutrophils Relative %: 83 %
Platelets: 234 10*3/uL (ref 150–400)
RBC: 3.93 MIL/uL (ref 3.87–5.11)
RDW: 15.2 % (ref 11.5–15.5)
WBC: 20.3 10*3/uL — ABNORMAL HIGH (ref 4.0–10.5)
nRBC: 0.1 % (ref 0.0–0.2)

## 2021-04-17 LAB — GLUCOSE, CAPILLARY
Glucose-Capillary: 86 mg/dL (ref 70–99)
Glucose-Capillary: 70 mg/dL (ref 70–99)

## 2021-04-17 MED ORDER — GLUCERNA SHAKE PO LIQD: 237.0000 mL | Freq: Two times a day (BID) | ORAL | 0 refills | Status: DC

## 2021-04-17 MED ORDER — DILTIAZEM HCL ER COATED BEADS 120 MG PO CP24: 120.0000 mg | ORAL_CAPSULE | Freq: Every day | ORAL | 0 refills | Status: DC

## 2021-04-17 MED ORDER — ENSURE MAX PROTEIN PO LIQD: 11.0000 [oz_av] | Freq: Every day | ORAL | 0 refills | Status: AC

## 2021-04-17 MED ORDER — RIVAROXABAN 15 MG PO TABS: 15.0000 mg | ORAL_TABLET | Freq: Every day | ORAL | 0 refills | Status: DC

## 2021-04-17 MED ORDER — ALBUTEROL SULFATE HFA 108 (90 BASE) MCG/ACT IN AERS: 2.0000 | INHALATION_SPRAY | Freq: Four times a day (QID) | RESPIRATORY_TRACT | 0 refills | Status: DC | PRN

## 2021-04-17 MED ORDER — CARVEDILOL 25 MG PO TABS: 25.0000 mg | ORAL_TABLET | Freq: Two times a day (BID) | ORAL | 0 refills | Status: DC

## 2021-04-17 NOTE — TOC Progression Note (Signed)
Transition of Care [facility]) - Progression Note    Patient Details  Name: MERLIN EGE MRN: [id number] Date of Birth: Aug 15, 1932  Transition of Care [facility]) CM/SW Contact  Django Nguyen, Juliann Pulse, RN Phone Number: 04/17/2021, 10:23 AM  Clinical Narrative:Await medical stability-[address] has a bed.       Expected Discharge Plan: [place] Barriers to Discharge: Continued Medical Work up  Expected Discharge Plan and Services Expected Discharge Plan: [place]   Discharge Planning Services: CM Consult Post Acute Care Choice: [place] Living arrangements for the past 2 months: Single Family Home                                       Social Determinants of Health (SDOH) Interventions    Readmission Risk Interventions No flowsheet data found.

## 2021-04-17 NOTE — Discharge Summary (Signed)
Discharge Summary  Sheila Morgan LGX:[id number] DOB: November 23, 1932  PCP: Binnie Rail, MD  Admit date: 04/05/2021 Discharge date: 04/17/2021  Time spent: 35 minutes  Recommendations for Outpatient Follow-up:  Follow-up with cardiology Follow-up with your primary care provider Take your medications as prescribed Continue PT OT with assistance and fall precautions  Discharge Diagnoses:  Active Hospital Problems   Diagnosis Date Noted   COVID 04/05/2021    Resolved Hospital Problems  No resolved problems to display.    Discharge Condition: Stable  Diet recommendation: Resume previous diet  Vitals:   04/16/21 2210 04/17/21 0503  BP: (!) 146/66 (!) 156/76  Pulse: 77 76  Resp: 20 18  Temp: 98 F (36.7 C) 98.1 F (36.7 C)  SpO2: 100% 99%    History of present illness:  Sheila Morgan is a 87 y.o. female with medical history significant of type 2 diabetes, hypothyroidism, hypertension, CAD, CABG, prior history of COVID has taken COVID vaccinations and monoclonal antibodies who presented to [facility] ED due to altered mental status.  Work-up revealed COVID-19 viral infection and A. fib with RVR.  She completed course of antiviral/steroid for COVID-19 viral infection.  She also completed course of azithromycin and Rocephin due to concern for superimposed bacterial pneumonia.  She was seen by cardiology due to A. fib with RVR.  She is currently on a new cardiac medication regimen.  Cardiology recommended Coreg 25 mg twice daily, p.o. diltiazem 120 mg daily for hypertension and rate control.  Also recommended Xarelto for CVA prevention.   04/17/21: Seen at her bedside while working with physical therapist.  There were no acute events overnight.  She has no new complaints.  Hospital Course:  Principal Problem:   COVID Resolved sepsis present on admission due to COVID PNA with concern for superimposed bacterial pneumonia On admission patient was tachypneic and tachycardic with  leukocytosis and fever of 101.2 Chest x-ray showing bibasilar atelectasis/infiltrate, completed azithromycin and Rocephin Urine culture 04/11/2021, 70,000 colonies of yeast.  Asymptomatic. Blood cultures negative to date. Completed azithromycin and Rocephin Completed antiviral Completed Decadron Follow-up with your PCP   Improving leukocytosis in the setting of steroids use, suspect reactive Afebrile Procalcitonin negative Suspect reactive in the setting of recent IV steroids Nonseptic appearing, no complaints.   Paroxysmal A. fib with RVR, converted to sinus rhythm on 04/16/2021, now rate controlled. TSH within normal limits Diltiazem changed to p.o. on 04/13/2021. P.o. Cardizem dose consolidated to 120 mg daily by cardiology on 04/16/2021, converted to sinus rhythm with rate in the 70s. Continue Coreg 25 mg twice daily Continue Xarelto for CVA prevention. Follow-up with cardiology   Type 2 diabetes with hyperglycemia, exacerbated by steroids. Hemoglobin A1c 7.2 Resume home regimen   Resolved AKI on CKD 3A Baseline creatinine appears to be 1.0 GFR 55 Creatinine peaked at 1.4 with GFR of 37 She is back to her baseline creatinine 1.02 with GFR of 54. Continue to avoid nephrotoxic agents Follow-up with your PCP   History of hypertension BP stable Management as stated above per cardiology. Continue to hold home Norvasc and Imdur  Follow-up with cardiology   Hyperlipidemia: Continue on Zocor   Hypothyroidism: Continue on Synthroid   DVT on xarelto   Goals of care  Patient is full code Seen by physical therapy recommending SNF   Physical debility/ambulatory dysfunction PT OT recommending SNF TOC consulted to assist with SNF placement Plan to DC to SNF in [place] area close to her daughter.  Estimated body mass index is 26.07 kg/m as calculated from the following:   Height as of this encounter: 5\' 4"  (1.626 m).   Weight as of this encounter: 68.9 kg.   DVT  prophylaxis: Xarelto    Consultants: Cardiology, signed off.   Procedures: None Antimicrobials: Completed Rocephin, azithromycin and PaxLOVID     Discharge Exam: BP (!) 156/76 (BP Location: Left Arm)    Pulse 76    Temp 98.1 F (36.7 C) (Oral)    Resp 18    Ht 5\' 4"  (1.626 m)    Wt 68.9 kg    SpO2 99%    BMI 26.07 kg/m  General: 87 y.o. year-old female well developed well nourished in no acute distress.  Alert and interactive.  Very hard of hearing Cardiovascular: Regular rate and rhythm with no rubs or gallops.  No thyromegaly or JVD noted.   Respiratory: Clear to auscultation with no wheezes or rales. Good inspiratory effort. Abdomen: Soft nontender nondistended with normal bowel sounds x4 quadrants. Musculoskeletal: No lower extremity edema. 2/4 pulses in all 4 extremities. Psychiatry: Mood is appropriate for condition and setting  Discharge Instructions You were cared for by a hospitalist during your hospital stay. If you have any questions about your discharge medications or the care you received while you were in the hospital after you are discharged, you can call the unit and asked to speak with the hospitalist on call if the hospitalist that took care of you is not available. Once you are discharged, your primary care physician will handle any further medical issues. Please note that NO REFILLS for any discharge medications will be authorized once you are discharged, as it is imperative that you return to your primary care physician (or establish a relationship with a primary care physician if you do not have one) for your aftercare needs so that they can reassess your need for medications and monitor your lab values.   Allergies as of 04/17/2021   No Known Allergies      Medication List     STOP taking these medications    amLODipine 5 MG tablet Commonly known as: NORVASC   furosemide 20 MG tablet Commonly known as: LASIX   isosorbide mononitrate 30 MG 24 hr  tablet Commonly known as: IMDUR   olmesartan 40 MG tablet Commonly known as: BENICAR   predniSONE 5 MG tablet Commonly known as: DELTASONE       TAKE these medications    acetaminophen 500 MG tablet Commonly known as: TYLENOL Take 500 mg by mouth every 6 (six) hours as needed for moderate pain. What changed: Another medication with the same name was removed. Continue taking this medication, and follow the directions you see here.   albuterol 108 (90 Base) MCG/ACT inhaler Commonly known as: VENTOLIN HFA Inhale 2 puffs into the lungs every 6 (six) hours as needed for wheezing or shortness of breath.   alendronate 70 MG tablet Commonly known as: FOSAMAX Take 70 mg by mouth every Saturday. Take with a full glass of water on an empty stomach.   CALCIUM 600-D PO Take 1 tablet by mouth 2 (two) times daily.   carvedilol 25 MG tablet Commonly known as: COREG Take 1 tablet (25 mg total) by mouth 2 (two) times daily with a meal. What changed:  how much to take how to take this when to take this additional instructions   colchicine 0.6 MG tablet Take 1 tablet by mouth every other day.   diltiazem  120 MG 24 hr capsule Commonly known as: CARDIZEM CD Take 1 capsule (120 mg total) by mouth daily.   feeding supplement (GLUCERNA SHAKE) Liqd Take 237 mLs by mouth 2 (two) times daily between meals for 7 days.   Ensure Max Protein Liqd Take 330 mLs (11 oz total) by mouth daily for 7 days.   levothyroxine 50 MCG tablet Commonly known as: SYNTHROID TAKE 1 TABLET(50 MCG) BY MOUTH DAILY   metFORMIN 500 MG tablet Commonly known as: GLUCOPHAGE TAKE 1/2 TABLET(250 MG) BY MOUTH DAILY WITH LARGEST MEAL   MULTIPLE VITAMIN PO Take 1 tablet by mouth daily.   OneTouch Delica Plus [id number] Misc USE AS DIRECTED TO TEST BLOOD SUGAR TWICE DAILY   glucose blood test strip OneTouch Ultra Test strips  USE TO TEST BLOOD SUGAR TWICE DAILY   OneTouch Ultra test strip Generic drug: glucose  blood USE TO TEST BLOOD SUGAR TWICE DAILY   Rivaroxaban 15 MG Tabs tablet Commonly known as: XARELTO Take 1 tablet (15 mg total) by mouth daily with supper. What changed:  medication strength how much to take   simvastatin 40 MG tablet Commonly known as: ZOCOR TAKE 1 TABLET BY MOUTH IN THE EVENING What changed:  how much to take how to take this when to take this   vitamin B-12 1000 MCG tablet Commonly known as: CYANOCOBALAMIN Take 1,000 mcg by mouth daily.       No Known Allergies  Contact information for follow-up providers     Binnie Rail, MD. Call today.   Specialty: Internal Medicine Why: Please call for a post hospital follow-up appointment. Contact information: [place] [address] [phone]         Lorretta Harp, MD .   Specialties: Cardiology, Radiology Contact information: [address] [place] [address] [phone]              Contact information for after-discharge care     Destination     [facility] .   Service: Skilled Nursing Contact information: [place] [phone]                      The results of significant diagnostics from this hospitalization (including imaging, microbiology, ancillary and laboratory) are listed below for reference.    Significant Diagnostic Studies: DG Chest 1 View  Result Date: 04/06/2021 CLINICAL DATA:  Hypoxia EXAM: CHEST  1 VIEW COMPARISON:  04/05/2021 FINDINGS: Lungs are essentially clear. Biapical pleural-parenchymal scarring. No pleural effusion or pneumothorax. The heart is normal in size. Postsurgical changes related to prior CABG. Degenerative changes of the bilateral shoulders. IMPRESSION: No evidence of acute cardiopulmonary disease. Electronically Signed   By: Julian Hy M.D.   On: 04/06/2021 20:39   CT Head Wo Contrast  Result Date: 04/05/2021 CLINICAL DATA:   Altered mental status EXAM: CT HEAD WITHOUT CONTRAST TECHNIQUE: Contiguous axial images were obtained from the base of the skull through the vertex without intravenous contrast. COMPARISON:  06/15/2018 FINDINGS: Brain: There are no signs of bleeding within the cranium. Ventricles are not dilated. Cortical sulci are prominent. There is decreased density in the subcortical and periventricular white matter. Vascular: Scattered arterial calcifications are seen. Skull: Unremarkable. Sinuses/Orbits: Unremarkable. Other: There is increased amount of CSF insula suggesting partial empty sella. IMPRESSION: No acute intracranial findings are seen in noncontrast CT brain. Atrophy. Small-vessel disease. Electronically Signed   By: Prudy Feeler.D.  On: 04/05/2021 14:56   DG Chest Port 1 View  Result Date: 04/11/2021 CLINICAL DATA:  Ago cytosis. EXAM: PORTABLE CHEST 1 VIEW COMPARISON:  04/06/2021 FINDINGS: 0803 hours. The cardio pericardial silhouette is enlarged. Bibasilar atelectasis/infiltrate with small pleural effusions noted. No pulmonary edema. Bones are diffusely demineralized. Telemetry leads overlie the chest. IMPRESSION: Bibasilar atelectasis/infiltrate with small pleural effusions. Electronically Signed   By: Misty Stanley M.D.   On: 04/11/2021 08:37   DG Chest Port 1 View  Result Date: 04/05/2021 CLINICAL DATA:  Sepsis.  Altered mental status. EXAM: PORTABLE CHEST 1 VIEW COMPARISON:  07/14/2018 FINDINGS: Prior CABG. Atherosclerotic calcification of the aortic arch. Stable double contour of the left hemidiaphragm attributable to scarring and/or calcification along the diaphragm. The lung apices are partially obscured by the patient's facial tissues. The lungs appear otherwise clear. Upper normal heart size. Degenerative glenohumeral arthropathy bilaterally. IMPRESSION: 1. Stable scarring and/or calcification along the left hemidiaphragm. 2. Aortic Atherosclerosis (ICD10-I70.0). Borderline enlargement  of the cardiopericardial silhouette. Electronically Signed   By: Van Clines M.D.   On: 04/05/2021 12:57    Microbiology: Recent Results (from the past 240 hour(s))  Urine Culture     Status: Abnormal   Collection Time: 04/11/21 11:56 AM   Specimen: Urine, Clean Catch  Result Value Ref Range Status   Specimen Description   Final    URINE, CLEAN CATCH Performed at [facility], [place] [address], [place], [place] [address]    Special Requests   Final    NONE Performed at [facility], [place] [address], [place], [place] [address]    Culture 70,000 COLONIES/mL YEAST (A)  Final   Report Status 04/13/2021 FINAL  Final  Resp Panel by RT-PCR (Flu A&B, Covid) Nasopharyngeal Swab     Status: Abnormal   Collection Time: 04/16/21  5:45 AM   Specimen: Nasopharyngeal Swab; Nasopharyngeal(NP) swabs in vial transport medium  Result Value Ref Range Status   SARS Coronavirus 2 by RT PCR POSITIVE (A) NEGATIVE Final    Comment: (NOTE) SARS-CoV-2 target nucleic acids are DETECTED.  The SARS-CoV-2 RNA is generally detectable in upper respiratory specimens during the acute phase of infection. Positive results are indicative of the presence of the identified virus, but do not rule out bacterial infection or co-infection with other pathogens not detected by the test. Clinical correlation with patient history and other diagnostic information is necessary to determine patient infection status. The expected result is Negative.  Fact Sheet for Patients: EntrepreneurPulse.com.au  Fact Sheet for Healthcare Providers: IncredibleEmployment.be  This test is not yet approved or cleared by the [place] FDA and  has been authorized for detection and/or diagnosis of SARS-CoV-2 by FDA under an Emergency Use Authorization (EUA).  This EUA will remain in effect (meaning this test can be used) for the duration of  the COVID-19  declaration under Section 564(b)(1) of the A ct, 21 U.S.C. section 360bbb-3(b)(1), unless the authorization is terminated or revoked sooner.     Influenza A by PCR NEGATIVE NEGATIVE Final   Influenza B by PCR NEGATIVE NEGATIVE Final    Comment: (NOTE) The Xpert Xpress SARS-CoV-2/FLU/RSV plus assay is intended as an aid in the diagnosis of influenza from Nasopharyngeal swab specimens and should not be used as a sole basis for treatment. Nasal washings and aspirates are unacceptable for Xpert Xpress SARS-CoV-2/FLU/RSV testing.  Fact Sheet for Patients: EntrepreneurPulse.com.au  Fact Sheet for Healthcare Providers: IncredibleEmployment.be  This test is not yet approved or cleared  by the [place] and has been authorized for detection and/or diagnosis of SARS-CoV-2 by FDA under an Emergency Use Authorization (EUA). This EUA will remain in effect (meaning this test can be used) for the duration of the COVID-19 declaration under Section 564(b)(1) of the Act, 21 U.S.C. section 360bbb-3(b)(1), unless the authorization is terminated or revoked.  Performed at [facility], [place] [address], [place], [place] [address]      Labs: Basic Metabolic Panel: Recent Labs  Lab 04/11/21 0547 04/12/21 0535 04/13/21 0533 04/16/21 0452  NA 138 140 139 142  K 3.8 4.1 3.7 3.7  CL 108 109 107 110  CO2 24 24 24 26   GLUCOSE 213* 199* 192* 74  BUN 43* 46* 46* 44*  CREATININE 1.40* 1.27* 1.24* 1.02*  CALCIUM 9.2 9.4 9.3 8.9  MG  --   --   --  2.4  PHOS  --   --   --  2.8   Liver Function Tests: No results for input(s): AST, ALT, ALKPHOS, BILITOT, PROT, ALBUMIN in the last 168 hours. No results for input(s): LIPASE, AMYLASE in the last 168 hours. No results for input(s): AMMONIA in the last 168 hours. CBC: Recent Labs  Lab 04/13/21 0533 04/14/21 0451 04/15/21 0456 04/16/21 0452 04/17/21 0441  WBC 18.1* 17.2* 19.7* 21.4* 20.3*   NEUTROABS 16.0* 14.8* 16.3* 17.5* 17.2*  HGB 11.5* 11.6* 12.0 11.3* 12.0  HCT 34.1* 36.4 36.1 35.0* 36.8  MCV 90.9 92.4 93.8 93.3 93.6  PLT 258 251 277 249 234   Cardiac Enzymes: No results for input(s): CKTOTAL, CKMB, CKMBINDEX, TROPONINI in the last 168 hours. BNP: BNP (last 3 results) No results for input(s): BNP in the last 8760 hours.  ProBNP (last 3 results) No results for input(s): PROBNP in the last 8760 hours.  CBG: Recent Labs  Lab 04/16/21 0759 04/16/21 1212 04/16/21 1641 04/16/21 2208 04/17/21 0740  GLUCAP 68* 152* 152* 161* 86       Signed:  Kayleen Memos, MD [facility] 04/17/2021, 10:48 AM

## 2021-04-17 NOTE — Progress Notes (Signed)
Occupational Therapy Treatment Patient Details Name: Sheila Morgan MRN: [id number] DOB: 03/22/1933 Today's Date: 04/17/2021   History of present illness 87 yo female admitted with confusion, sepsis, COVID. Hx of CAD, OA, gout, hearing loss, R shoulder radiculopathy, DM, CABG, shingles, MI, falls   OT comments  Patient up in recliner when therapist entered the room. Patient reports being in recliner all night and that her bottom hurt. When legs lowered to ground patient reports pain and stiffness. Therapist had patient move knees and hip to improve mobility prior to attempt to stand. Attempted to stand with use of stedy x 2 but patient unable to assist at all with powering up. Patient was total assist to perform lateral scoot to edge of bed and max assist to return to supine. Patient performed grooming task in bed after being made comfortable. Therapist recommended patient move legs in bed. Continue to recommend short term rehab at discharge as she has her ability to perform functional mobility has declined since evaluation.   Recommendations for follow up therapy are one component of a multi-disciplinary discharge planning process, led by the attending physician.  Recommendations may be updated based on patient status, additional functional criteria and insurance authorization.    Follow Up Recommendations  Skilled nursing-short term rehab (<3 hours/day)    Assistance Recommended at Discharge Frequent or constant Supervision/Assistance  Equipment Recommendations  Other (comment) (defer to next venue)    Recommendations for Other Services      Precautions / Restrictions Precautions Precautions: Fall Precaution Comments: monitor HR, sats stable on RA Restrictions Weight Bearing Restrictions: No       Mobility Bed Mobility Overal bed mobility: Needs Assistance Bed Mobility: Sit to Supine       Sit to supine: Max assist   General bed mobility comments: Max assist to return to  supine.    Transfers Overall transfer level: Needs assistance Equipment used: None Transfers: Sit to/from Stand;Bed to chair/wheelchair/BSC            Lateral/Scoot Transfers: Total assist General transfer comment: Attempts to stand with stedy x 2 unsuccessful. Patient total assist for two lateral scoots to transfer back in to bed.     Balance Overall balance assessment: Needs assistance Sitting-balance support: No upper extremity supported;Feet supported Sitting balance-Leahy Scale: Good                                     ADL either performed or assessed with clinical judgement   ADL       Grooming: Set up;Oral care;Wash/dry face Grooming Details (indicate cue type and reason): set up for ADLs                                    Extremity/Trunk Assessment              Vision Patient Visual Report: No change from baseline     Perception     Praxis      Cognition Arousal/Alertness: Awake/alert Behavior During Therapy: WFL for tasks assessed/performed Overall Cognitive Status: History of cognitive impairments - at baseline                                            Exercises  Shoulder Instructions       General Comments      Pertinent Vitals/ Pain       Pain Assessment: Faces Faces Pain Scale: Hurts little more Pain Location: R knee Pain Descriptors / Indicators: Aching;Grimacing Pain Intervention(s): Limited activity within patient's tolerance  Home Living                                          Prior Functioning/Environment              Frequency  Min 2X/week        Progress Toward Goals  OT Goals(current goals can now be found in the care plan section)  Progress towards OT goals: Not progressing toward goals - comment  Acute Rehab OT Goals Patient Stated Goal: to get up and move around OT Goal Formulation: With patient Time For Goal Achievement:  04/22/21 Potential to Achieve Goals: Good  Plan Discharge plan remains appropriate    Co-evaluation                 AM-PAC OT "6 Clicks" Daily Activity     Outcome Measure   Help from another person eating meals?: None Help from another person taking care of personal grooming?: A Little Help from another person toileting, which includes using toliet, bedpan, or urinal?: Total Help from another person bathing (including washing, rinsing, drying)?: A Lot Help from another person to put on and taking off regular upper body clothing?: A Little Help from another person to put on and taking off regular lower body clothing?: A Lot 6 Click Score: 15    End of Session Equipment Utilized During Treatment: Gait belt;Other (comment) (tedy)  OT Visit Diagnosis: Unsteadiness on feet (R26.81);Other symptoms and signs involving cognitive function;History of falling (Z91.81);Muscle weakness (generalized) (M62.81)   Activity Tolerance Patient tolerated treatment well   Patient Left in bed;with call bell/phone within reach;with bed alarm set   Nurse Communication  (okay to see)        Time: 9179-1505 OT Time Calculation (min): 25 min  Charges: OT General Charges $OT Visit: 1 Visit OT Treatments $Self Care/Home Management : 8-22 mins $Therapeutic Activity: 8-22 mins  Derl Barrow, OTR/L [place]  Office [phone] Pager: [place] 04/17/2021, 11:27 AM

## 2021-04-17 NOTE — Progress Notes (Signed)
Pt in NAD being discharged to [facility] room #705 via [facility] ambulance service. Pt's daughter present.

## 2021-04-17 NOTE — Progress Notes (Signed)
1500- report called to [place] at  [facility].

## 2021-04-17 NOTE — Care Management Important Message (Signed)
Important Message  Patient Details IM Letter given to Patients daughter Name: Sheila Morgan MRN: [id number] Date of Birth: [birth year]   Medicare Important Message Given:  Yes     Kerin Salen 04/17/2021, 2:49 PM

## 2021-04-18 ENCOUNTER — Inpatient Hospital Stay (HOSPITAL_COMMUNITY)
Admission: EM | Admit: 2021-04-18 | Discharge: 2021-04-24 | DRG: 291 | Disposition: A | Discharge status: Skilled Nursing Facility | Payer: Medicare Other | Attending: Internal Medicine | Admitting: Internal Medicine

## 2021-04-18 ENCOUNTER — Other Ambulatory Visit: Payer: Self-pay | Admitting: *Deleted

## 2021-04-18 ENCOUNTER — Emergency Department (HOSPITAL_COMMUNITY): Payer: Medicare Other

## 2021-04-18 DIAGNOSIS — D849 Immunodeficiency, unspecified: Secondary | ICD-10-CM | POA: Diagnosis present

## 2021-04-18 DIAGNOSIS — D649 Anemia, unspecified: Secondary | ICD-10-CM | POA: Diagnosis present

## 2021-04-18 DIAGNOSIS — G319 Degenerative disease of nervous system, unspecified: Secondary | ICD-10-CM | POA: Diagnosis not present

## 2021-04-18 DIAGNOSIS — U071 COVID-19: Secondary | ICD-10-CM | POA: Diagnosis not present

## 2021-04-18 DIAGNOSIS — E039 Hypothyroidism, unspecified: Secondary | ICD-10-CM | POA: Diagnosis not present

## 2021-04-18 DIAGNOSIS — R4182 Altered mental status, unspecified: Secondary | ICD-10-CM | POA: Diagnosis not present

## 2021-04-18 DIAGNOSIS — R1312 Dysphagia, oropharyngeal phase: Secondary | ICD-10-CM | POA: Diagnosis not present

## 2021-04-18 DIAGNOSIS — I480 Paroxysmal atrial fibrillation: Secondary | ICD-10-CM | POA: Diagnosis not present

## 2021-04-18 DIAGNOSIS — R0689 Other abnormalities of breathing: Secondary | ICD-10-CM | POA: Diagnosis not present

## 2021-04-18 DIAGNOSIS — Z951 Presence of aortocoronary bypass graft: Secondary | ICD-10-CM | POA: Diagnosis not present

## 2021-04-18 DIAGNOSIS — I252 Old myocardial infarction: Secondary | ICD-10-CM | POA: Diagnosis not present

## 2021-04-18 DIAGNOSIS — Z86718 Personal history of other venous thrombosis and embolism: Secondary | ICD-10-CM

## 2021-04-18 DIAGNOSIS — U099 Post covid-19 condition, unspecified: Secondary | ICD-10-CM | POA: Diagnosis not present

## 2021-04-18 DIAGNOSIS — I130 Hypertensive heart and chronic kidney disease with heart failure and stage 1 through stage 4 chronic kidney disease, or unspecified chronic kidney disease: Principal | ICD-10-CM | POA: Diagnosis present

## 2021-04-18 DIAGNOSIS — M810 Age-related osteoporosis without current pathological fracture: Secondary | ICD-10-CM | POA: Diagnosis present

## 2021-04-18 DIAGNOSIS — L89150 Pressure ulcer of sacral region, unstageable: Secondary | ICD-10-CM | POA: Diagnosis present

## 2021-04-18 DIAGNOSIS — D631 Anemia in chronic kidney disease: Secondary | ICD-10-CM | POA: Diagnosis present

## 2021-04-18 DIAGNOSIS — R6520 Severe sepsis without septic shock: Secondary | ICD-10-CM | POA: Diagnosis not present

## 2021-04-18 DIAGNOSIS — J9600 Acute respiratory failure, unspecified whether with hypoxia or hypercapnia: Secondary | ICD-10-CM | POA: Diagnosis not present

## 2021-04-18 DIAGNOSIS — Z833 Family history of diabetes mellitus: Secondary | ICD-10-CM

## 2021-04-18 DIAGNOSIS — J9601 Acute respiratory failure with hypoxia: Secondary | ICD-10-CM | POA: Diagnosis present

## 2021-04-18 DIAGNOSIS — D689 Coagulation defect, unspecified: Secondary | ICD-10-CM | POA: Diagnosis present

## 2021-04-18 DIAGNOSIS — I517 Cardiomegaly: Secondary | ICD-10-CM | POA: Diagnosis not present

## 2021-04-18 DIAGNOSIS — M109 Gout, unspecified: Secondary | ICD-10-CM | POA: Diagnosis present

## 2021-04-18 DIAGNOSIS — I152 Hypertension secondary to endocrine disorders: Secondary | ICD-10-CM | POA: Diagnosis not present

## 2021-04-18 DIAGNOSIS — E1159 Type 2 diabetes mellitus with other circulatory complications: Secondary | ICD-10-CM | POA: Diagnosis not present

## 2021-04-18 DIAGNOSIS — N1831 Chronic kidney disease, stage 3a: Secondary | ICD-10-CM | POA: Diagnosis present

## 2021-04-18 DIAGNOSIS — G9341 Metabolic encephalopathy: Secondary | ICD-10-CM | POA: Diagnosis present

## 2021-04-18 DIAGNOSIS — N183 Chronic kidney disease, stage 3 unspecified: Secondary | ICD-10-CM | POA: Diagnosis not present

## 2021-04-18 DIAGNOSIS — Z7901 Long term (current) use of anticoagulants: Secondary | ICD-10-CM

## 2021-04-18 DIAGNOSIS — R9431 Abnormal electrocardiogram [ECG] [EKG]: Secondary | ICD-10-CM | POA: Diagnosis not present

## 2021-04-18 DIAGNOSIS — I5033 Acute on chronic diastolic (congestive) heart failure: Secondary | ICD-10-CM | POA: Diagnosis present

## 2021-04-18 DIAGNOSIS — Z7401 Bed confinement status: Secondary | ICD-10-CM | POA: Diagnosis not present

## 2021-04-18 DIAGNOSIS — I2510 Atherosclerotic heart disease of native coronary artery without angina pectoris: Secondary | ICD-10-CM | POA: Diagnosis present

## 2021-04-18 DIAGNOSIS — J9621 Acute and chronic respiratory failure with hypoxia: Secondary | ICD-10-CM | POA: Diagnosis present

## 2021-04-18 DIAGNOSIS — E785 Hyperlipidemia, unspecified: Secondary | ICD-10-CM | POA: Diagnosis present

## 2021-04-18 DIAGNOSIS — R262 Difficulty in walking, not elsewhere classified: Secondary | ICD-10-CM | POA: Diagnosis not present

## 2021-04-18 DIAGNOSIS — R1311 Dysphagia, oral phase: Secondary | ICD-10-CM | POA: Diagnosis not present

## 2021-04-18 DIAGNOSIS — N179 Acute kidney failure, unspecified: Secondary | ICD-10-CM | POA: Diagnosis present

## 2021-04-18 DIAGNOSIS — Z79890 Hormone replacement therapy (postmenopausal): Secondary | ICD-10-CM

## 2021-04-18 DIAGNOSIS — J9811 Atelectasis: Secondary | ICD-10-CM | POA: Diagnosis not present

## 2021-04-18 DIAGNOSIS — R404 Transient alteration of awareness: Secondary | ICD-10-CM | POA: Diagnosis not present

## 2021-04-18 DIAGNOSIS — M6259 Muscle wasting and atrophy, not elsewhere classified, multiple sites: Secondary | ICD-10-CM | POA: Diagnosis not present

## 2021-04-18 DIAGNOSIS — A419 Sepsis, unspecified organism: Secondary | ICD-10-CM | POA: Diagnosis not present

## 2021-04-18 DIAGNOSIS — Z8249 Family history of ischemic heart disease and other diseases of the circulatory system: Secondary | ICD-10-CM

## 2021-04-18 DIAGNOSIS — R0902 Hypoxemia: Secondary | ICD-10-CM | POA: Diagnosis not present

## 2021-04-18 DIAGNOSIS — E1122 Type 2 diabetes mellitus with diabetic chronic kidney disease: Secondary | ICD-10-CM | POA: Diagnosis present

## 2021-04-18 DIAGNOSIS — R0602 Shortness of breath: Secondary | ICD-10-CM | POA: Diagnosis not present

## 2021-04-18 DIAGNOSIS — E1151 Type 2 diabetes mellitus with diabetic peripheral angiopathy without gangrene: Secondary | ICD-10-CM | POA: Diagnosis present

## 2021-04-18 DIAGNOSIS — R5381 Other malaise: Secondary | ICD-10-CM | POA: Diagnosis not present

## 2021-04-18 DIAGNOSIS — I4891 Unspecified atrial fibrillation: Secondary | ICD-10-CM | POA: Diagnosis present

## 2021-04-18 DIAGNOSIS — G9340 Encephalopathy, unspecified: Secondary | ICD-10-CM | POA: Diagnosis present

## 2021-04-18 DIAGNOSIS — J449 Chronic obstructive pulmonary disease, unspecified: Secondary | ICD-10-CM | POA: Diagnosis not present

## 2021-04-18 DIAGNOSIS — Z7983 Long term (current) use of bisphosphonates: Secondary | ICD-10-CM

## 2021-04-18 DIAGNOSIS — M353 Polymyalgia rheumatica: Secondary | ICD-10-CM | POA: Diagnosis not present

## 2021-04-18 DIAGNOSIS — Z96641 Presence of right artificial hip joint: Secondary | ICD-10-CM | POA: Diagnosis present

## 2021-04-18 DIAGNOSIS — Z85828 Personal history of other malignant neoplasm of skin: Secondary | ICD-10-CM

## 2021-04-18 DIAGNOSIS — Z7984 Long term (current) use of oral hypoglycemic drugs: Secondary | ICD-10-CM

## 2021-04-18 DIAGNOSIS — Z96652 Presence of left artificial knee joint: Secondary | ICD-10-CM | POA: Diagnosis present

## 2021-04-18 DIAGNOSIS — I482 Chronic atrial fibrillation, unspecified: Secondary | ICD-10-CM | POA: Diagnosis present

## 2021-04-18 DIAGNOSIS — I700 Atherosclerosis of aorta: Secondary | ICD-10-CM | POA: Diagnosis not present

## 2021-04-18 DIAGNOSIS — R2689 Other abnormalities of gait and mobility: Secondary | ICD-10-CM | POA: Diagnosis not present

## 2021-04-18 DIAGNOSIS — J1282 Pneumonia due to coronavirus disease 2019: Secondary | ICD-10-CM | POA: Diagnosis not present

## 2021-04-18 DIAGNOSIS — R6510 Systemic inflammatory response syndrome (SIRS) of non-infectious origin without acute organ dysfunction: Secondary | ICD-10-CM | POA: Diagnosis not present

## 2021-04-18 DIAGNOSIS — Z955 Presence of coronary angioplasty implant and graft: Secondary | ICD-10-CM

## 2021-04-18 DIAGNOSIS — Z79899 Other long term (current) drug therapy: Secondary | ICD-10-CM

## 2021-04-18 DIAGNOSIS — K8020 Calculus of gallbladder without cholecystitis without obstruction: Secondary | ICD-10-CM | POA: Diagnosis present

## 2021-04-18 DIAGNOSIS — J90 Pleural effusion, not elsewhere classified: Secondary | ICD-10-CM | POA: Diagnosis not present

## 2021-04-18 DIAGNOSIS — I6782 Cerebral ischemia: Secondary | ICD-10-CM | POA: Diagnosis not present

## 2021-04-18 DIAGNOSIS — M6281 Muscle weakness (generalized): Secondary | ICD-10-CM | POA: Diagnosis not present

## 2021-04-18 DIAGNOSIS — J189 Pneumonia, unspecified organism: Secondary | ICD-10-CM | POA: Diagnosis not present

## 2021-04-18 DIAGNOSIS — I959 Hypotension, unspecified: Secondary | ICD-10-CM | POA: Diagnosis not present

## 2021-04-18 DIAGNOSIS — D6859 Other primary thrombophilia: Secondary | ICD-10-CM | POA: Diagnosis not present

## 2021-04-18 DIAGNOSIS — R41841 Cognitive communication deficit: Secondary | ICD-10-CM | POA: Diagnosis not present

## 2021-04-18 DIAGNOSIS — R278 Other lack of coordination: Secondary | ICD-10-CM | POA: Diagnosis not present

## 2021-04-18 DIAGNOSIS — I10 Essential (primary) hypertension: Secondary | ICD-10-CM | POA: Diagnosis not present

## 2021-04-18 DIAGNOSIS — D72829 Elevated white blood cell count, unspecified: Secondary | ICD-10-CM | POA: Diagnosis not present

## 2021-04-18 LAB — CBC WITH DIFFERENTIAL/PLATELET
Abs Immature Granulocytes: 0.17 10*3/uL — ABNORMAL HIGH (ref 0.00–0.07)
Basophils Absolute: 0 10*3/uL (ref 0.0–0.1)
Basophils Relative: 0 %
Eosinophils Absolute: 0 10*3/uL (ref 0.0–0.5)
Eosinophils Relative: 0 %
HCT: 30.6 % — ABNORMAL LOW (ref 36.0–46.0)
Hemoglobin: 9.6 g/dL — ABNORMAL LOW (ref 12.0–15.0)
Immature Granulocytes: 1 %
Lymphocytes Relative: 4 %
Lymphs Abs: 0.7 10*3/uL (ref 0.7–4.0)
MCH: 30.2 pg (ref 26.0–34.0)
MCHC: 31.4 g/dL (ref 30.0–36.0)
MCV: 96.2 fL (ref 80.0–100.0)
Monocytes Absolute: 2.5 10*3/uL — ABNORMAL HIGH (ref 0.1–1.0)
Monocytes Relative: 13 %
Neutro Abs: 16.1 10*3/uL — ABNORMAL HIGH (ref 1.7–7.7)
Neutrophils Relative %: 82 %
Platelets: 148 10*3/uL — ABNORMAL LOW (ref 150–400)
RBC: 3.18 MIL/uL — ABNORMAL LOW (ref 3.87–5.11)
RDW: 15.5 % (ref 11.5–15.5)
WBC: 19.5 10*3/uL — ABNORMAL HIGH (ref 4.0–10.5)
nRBC: 0 % (ref 0.0–0.2)

## 2021-04-18 LAB — COMPREHENSIVE METABOLIC PANEL
ALT: 37 U/L (ref 0–44)
AST: 19 U/L (ref 15–41)
Albumin: 1.7 g/dL — ABNORMAL LOW (ref 3.5–5.0)
Alkaline Phosphatase: 59 U/L (ref 38–126)
Anion gap: 6 (ref 5–15)
BUN: 25 mg/dL — ABNORMAL HIGH (ref 8–23)
CO2: 23 mmol/L (ref 22–32)
Calcium: 8.3 mg/dL — ABNORMAL LOW (ref 8.9–10.3)
Chloride: 105 mmol/L (ref 98–111)
Creatinine, Ser: 1.44 mg/dL — ABNORMAL HIGH (ref 0.44–1.00)
GFR, Estimated: 35 mL/min — ABNORMAL LOW (ref >60)
Glucose, Bld: 155 mg/dL — ABNORMAL HIGH (ref 70–99)
Potassium: 4.3 mmol/L (ref 3.5–5.1)
Sodium: 134 mmol/L — ABNORMAL LOW (ref 135–145)
Total Bilirubin: 1.2 mg/dL (ref 0.3–1.2)
Total Protein: 4.9 g/dL — ABNORMAL LOW (ref 6.5–8.1)

## 2021-04-18 LAB — PROTIME-INR
INR: 4.8 (ref 0.8–1.2)
Prothrombin Time: 45.2 seconds — ABNORMAL HIGH (ref 11.4–15.2)

## 2021-04-18 LAB — APTT: aPTT: 54 seconds — ABNORMAL HIGH (ref 24–36)

## 2021-04-18 LAB — LACTIC ACID, PLASMA: Lactic Acid, Venous: 1 mmol/L (ref 0.5–1.9)

## 2021-04-18 MED ORDER — SODIUM CHLORIDE 0.9 % IV SOLN
2.0000 g | Freq: Once | INTRAVENOUS | Status: AC
  Administered 2021-04-19: 2 g via INTRAVENOUS
  Filled 2021-04-18: qty 2

## 2021-04-18 MED ORDER — SODIUM CHLORIDE 0.9 % IV BOLUS
500.0000 mL | Freq: Once | INTRAVENOUS | Status: AC
  Administered 2021-04-19: 500 mL via INTRAVENOUS

## 2021-04-18 NOTE — ED Triage Notes (Signed)
Pt bib EMS from [facility]. Patient is a recent admit from hospital for covid pneumonia. Staff noticed decreased oxygenation down to 78% on RA and patient was not as alert like normal. Hx of right rib fractures with very decreased air movement to side. Patient on 3L O2 [place]  Patient is a full code with [facility] form.

## 2021-04-18 NOTE — ED Provider Notes (Signed)
[facility] Provider Note   CSN: [id number] Arrival date & time: 04/18/21  2222     History No chief complaint on file.   ADELINE Morgan is a 87 y.o. female.  Patient is a 87 year old female with a history of diabetes, hypertension, coronary artery disease a status post bypass surgery who presents with hypoxia and fever.  She was noted to have sats of 78% on RA. She was recently admitted to the hospital from December 3 to December 15 and discharged yesterday to [facility] facility.  She was also noted on that admission to have A. fib with RVR which spontaneously converted to a sinus rhythm.  She was started on Coreg, diltiazem and Xarelto.  Today they noticed some progressively low oxygen saturations and a fever up to 101.  She has not had any known vomiting.  There was noted history of some right rib fractures per EMS.  Patient is oriented to person but appears to be confused.  History is limited by this.      Past Medical History:  Diagnosis Date   Arthritis    Cancer ([place])    skin on nose .  mylenoma   Coronary artery disease    status post post LAD angioplasty in 1993  and subsequent coronary artery bypass grafting x1 with a LIMA to the LAD July of 1994. She had stenting of her RCA with a bare-metal stent November 2002.   Diabetes mellitus    2   Gout    Heart murmur    Hematoma    Right groin   Hemorrhoids    Hiatal hernia    History of blood transfusion    HOH (hard of hearing)    Hyperlipidemia    Hypertension    Left knee DJD    Lightheadedness    has seen Dr Gwenlyn Found and ENT.. cant find out why   Myocardial infarction [facility])    Osteoporosis    PONV (postoperative nausea and vomiting)    Shingles 10/27/2013    Patient Active Problem List   Diagnosis Date Noted   COVID 04/05/2021   Abrasion of right hand 01/13/2021   Paroxysmal atrial fibrillation ([place]) 12/31/2020   Foot swelling 09/10/2020   Calcium  pyrophosphate arthropathy of multiple sites 05/14/2020   Small vessel disease ([place]) 02/12/2020   Left leg DVT ([place]), 12/15/19 12/15/2019   Urinary frequency 02/22/2019   Fall 07/14/2018   Bilateral hearing loss 01/05/2018   Memory difficulties 01/05/2018   Multiple closed fractures of ribs of right side 01/19/2017   Acute pain of right shoulder 01/05/2017   Acute pain of right knee 01/05/2017   B12 deficiency 06/13/2015   Hypothyroidism 06/13/2015   DM (diabetes mellitus) type II, controlled, with peripheral vascular disorder ([place]) 03/06/2014   Gout 10/15/2013   Arthritis    Hiatal hernia    Coronary artery disease    Left knee DJD    Benign paroxysmal positional vertigo 05/19/2013   Weakness generalized 05/09/2013   Pseudogout of left knee 03/29/2013   Acute right lumbar radiculopathy 03/18/2013   Shoulder impingement syndrome 01/25/2013   Vertigo 10/17/2012   Renal insufficiency, mild 06/20/2011   Essential hypertension 12/31/2009   Osteoporosis - Dr Kathlene November 12/31/2009   SKIN CANCER, HX OF 12/31/2009   Proteinuria 01/05/2008   Anemia 12/21/2007   Hyperlipidemia 09/08/2006   Polymyalgia rheumatica ([place]) 09/08/2006   CORONARY ARTERY BYPASS GRAFT, HX OF 09/08/2006    Past Surgical History:  Procedure Laterality Date   CORONARY ANGIOPLASTY  1993   LAD   CORONARY ANGIOPLASTY WITH STENT PLACEMENT  03/09/01   RCA   CORONARY ARTERY BYPASS GRAFT  1994   JOINT REPLACEMENT Right 1998   hip   NM MYOVIEW LTD  03/2012   Normal   SKIN GRAFT     to nose skin cancer   TOTAL HIP ARTHROPLASTY     TOTAL KNEE ARTHROPLASTY Left 10/09/2013   Procedure: TOTAL KNEE ARTHROPLASTY;  Surgeon: Lorn Junes, MD;  Location: [facility];  Service: Orthopedics;  Laterality: Left;     OB History   No obstetric history on file.     Family History  Problem Relation Age of Onset   Heart disease Father    Cancer Sister        breast   Diabetes Sister    Heart disease Brother    Heart disease  Brother    Heart disease Brother    Diabetes Sister    Stroke Mother     Social History   Tobacco Use   Smoking status: Never   Smokeless tobacco: Never  Vaping Use   Vaping Use: Never used  Substance Use Topics   Alcohol use: No   Drug use: No    Home Medications Prior to Admission medications   Medication Sig Start Date End Date Taking? Authorizing Provider  acetaminophen (TYLENOL) 500 MG tablet Take 500 mg by mouth every 6 (six) hours as needed for moderate pain.    [provider]  albuterol (VENTOLIN HFA) 108 (90 Base) MCG/ACT inhaler Inhale 2 puffs into the lungs every 6 (six) hours as needed for wheezing or shortness of breath. 04/17/21   Kayleen Memos, DO  alendronate (FOSAMAX) 70 MG tablet Take 70 mg by mouth every Saturday. Take with a full glass of water on an empty stomach.    [provider]  Calcium Carbonate-Vitamin D (CALCIUM 600-D PO) Take 1 tablet by mouth 2 (two) times daily.    [provider]  carvedilol (COREG) 25 MG tablet Take 1 tablet (25 mg total) by mouth 2 (two) times daily with a meal. 04/17/21 05/17/21  Kayleen Memos, DO  colchicine 0.6 MG tablet Take 1 tablet by mouth every other day.    [provider]  diltiazem (CARDIZEM CD) 120 MG 24 hr capsule Take 1 capsule (120 mg total) by mouth daily. 04/17/21 05/17/21  Kayleen Memos, DO  Ensure Max Protein (ENSURE MAX PROTEIN) LIQD Take 330 mLs (11 oz total) by mouth daily for 7 days. 04/17/21 04/24/21  Kayleen Memos, DO  feeding supplement, GLUCERNA SHAKE, (GLUCERNA SHAKE) LIQD Take 237 mLs by mouth 2 (two) times daily between meals for 7 days. 04/17/21 04/24/21  Kayleen Memos, DO  glucose blood test strip OneTouch Ultra Test strips  USE TO TEST BLOOD SUGAR TWICE DAILY    [provider]  Lancets (ONETOUCH DELICA PLUS [id number]) MISC USE AS DIRECTED TO TEST BLOOD SUGAR TWICE DAILY 11/04/20   Binnie Rail, MD  levothyroxine (SYNTHROID) 50 MCG tablet TAKE 1  TABLET(50 MCG) BY MOUTH DAILY 09/05/20   Binnie Rail, MD  metFORMIN (GLUCOPHAGE) 500 MG tablet TAKE 1/2 TABLET(250 MG) BY MOUTH DAILY WITH LARGEST MEAL 09/04/20   Binnie Rail, MD  MULTIPLE VITAMIN PO Take 1 tablet by mouth daily.    [provider]  [facility] ULTRA test strip USE TO TEST BLOOD SUGAR TWICE DAILY 05/09/20   Burns,  Claudina Lick, MD  Rivaroxaban (XARELTO) 15 MG TABS tablet Take 1 tablet (15 mg total) by mouth daily with supper. 04/17/21 07/16/21  Kayleen Memos, DO  simvastatin (ZOCOR) 40 MG tablet TAKE 1 TABLET BY MOUTH IN THE EVENING Patient taking differently: Take 20 mg by mouth every evening. TAKE 1 TABLET BY MOUTH IN THE EVENING 01/14/21   Lorretta Harp, MD  vitamin B-12 (CYANOCOBALAMIN) 1000 MCG tablet Take 1,000 mcg by mouth daily.    [provider]    Allergies    Patient has no known allergies.  Review of Systems   Review of Systems  Unable to perform ROS: Mental status change   Physical Exam Updated Vital Signs BP 136/60    Pulse 84    Temp 99.6 F (37.6 C) (Oral)    Resp (!) 28    SpO2 100%   Physical Exam Constitutional:      Appearance: She is well-developed.  HENT:     Head: Normocephalic and atraumatic.  Eyes:     Pupils: Pupils are equal, round, and reactive to light.  Cardiovascular:     Rate and Rhythm: Normal rate and regular rhythm.     Heart sounds: Normal heart sounds.  Pulmonary:     Effort: Pulmonary effort is normal. No respiratory distress.     Breath sounds: Normal breath sounds. No wheezing or rales.  Chest:     Chest wall: No tenderness.  Abdominal:     General: Bowel sounds are normal.     Palpations: Abdomen is soft.     Tenderness: There is no abdominal tenderness. There is no guarding or rebound.  Musculoskeletal:        General: Normal range of motion.     Cervical back: Normal range of motion and neck supple.     Right lower leg: No edema.     Left lower leg: No edema.  Lymphadenopathy:     Cervical: No  cervical adenopathy.  Skin:    General: Skin is warm and dry.     Findings: No rash.  Neurological:     Mental Status: She is alert.     Comments: Awake and alert, but confused, no focal deficit    ED Results / Procedures / Treatments   Labs (all labs ordered are listed, but only abnormal results are displayed) Labs Reviewed  CULTURE, BLOOD (ROUTINE X 2)  CULTURE, BLOOD (ROUTINE X 2)  URINE CULTURE  LACTIC ACID, PLASMA  LACTIC ACID, PLASMA  COMPREHENSIVE METABOLIC PANEL  CBC WITH DIFFERENTIAL/PLATELET  PROTIME-INR  APTT  URINALYSIS, ROUTINE W REFLEX MICROSCOPIC    EKG None  Radiology No results found.  Procedures Procedures   Medications Ordered in ED Medications - No data to display  ED Course  I have reviewed the triage vital signs and the nursing notes.  Pertinent labs & imaging results that were available during my care of the patient were reviewed by me and considered in my medical decision making (see chart for details).    MDM Rules/Calculators/A&P                         Patient is a 87 year old female who presents with fever and hypoxia after recent admission for COVID with potential superimposed bacterial pneumonia.  She also had A. fib with RVR.  Her blood pressure is stable.  She is maintaining oxygen saturations on a nasal cannula.  She does appear to be confused.  I  initiated evolving sepsis order set.  Care turned over to Dr. Sedonia Small pending results.  Patient will likely need admission.    Final Clinical Impression(s) / ED Diagnoses Final diagnoses:  None    Rx / DC Orders ED Discharge Orders     None        Malvin Johns, MD 04/18/21 2252

## 2021-04-18 NOTE — Patient Outreach (Signed)
Member screened for potential [facility] care coordination needs. Mrs. Spiewak resides in [facility].   Communication sent to [facility] SW to make aware writer follow for potential [facility] needs and transition plans.   Will continue to follow while member resides in SNF.   Marthenia Rolling, MSN, RN,BSN [facility] Acute Care Coordinator [phone] [facility]) [phone]  (Toll free office)

## 2021-04-18 NOTE — ED Provider Notes (Signed)
°  Provider Note MRN:  [id number]  Arrival date & time: 04/19/21    ED Course and Medical Decision Making  Assumed care from Dr. Tamera Punt at shift change.  Altered mental status, hypoxia, recent admission for COVID versus bacterial pneumonia.  Concern for sepsis of pulmonary origin, awaiting initial labs, chest x-ray.  Suspect will need admission.  Procedures  Final Clinical Impressions(s) / ED Diagnoses     ICD-10-CM   1. Altered mental status, unspecified altered mental status type  R41.82     2. Hypoxia  R09.02       ED Discharge Orders     None       Discharge Instructions   None     Barth Kirks. Sedonia Small, Kyle [email or web address]    Maudie Flakes, MD 04/19/21 0300

## 2021-04-19 ENCOUNTER — Inpatient Hospital Stay (HOSPITAL_COMMUNITY): Payer: Medicare Other

## 2021-04-19 ENCOUNTER — Other Ambulatory Visit: Payer: Self-pay

## 2021-04-19 ENCOUNTER — Encounter (HOSPITAL_COMMUNITY): Payer: Self-pay | Admitting: Family Medicine

## 2021-04-19 ENCOUNTER — Emergency Department (HOSPITAL_COMMUNITY): Payer: Medicare Other

## 2021-04-19 DIAGNOSIS — M109 Gout, unspecified: Secondary | ICD-10-CM | POA: Diagnosis present

## 2021-04-19 DIAGNOSIS — G9340 Encephalopathy, unspecified: Secondary | ICD-10-CM

## 2021-04-19 DIAGNOSIS — I480 Paroxysmal atrial fibrillation: Secondary | ICD-10-CM

## 2021-04-19 DIAGNOSIS — D649 Anemia, unspecified: Secondary | ICD-10-CM | POA: Diagnosis not present

## 2021-04-19 DIAGNOSIS — R41841 Cognitive communication deficit: Secondary | ICD-10-CM | POA: Diagnosis not present

## 2021-04-19 DIAGNOSIS — R6510 Systemic inflammatory response syndrome (SIRS) of non-infectious origin without acute organ dysfunction: Secondary | ICD-10-CM

## 2021-04-19 DIAGNOSIS — M6281 Muscle weakness (generalized): Secondary | ICD-10-CM | POA: Diagnosis not present

## 2021-04-19 DIAGNOSIS — A419 Sepsis, unspecified organism: Secondary | ICD-10-CM | POA: Diagnosis not present

## 2021-04-19 DIAGNOSIS — R1312 Dysphagia, oropharyngeal phase: Secondary | ICD-10-CM | POA: Diagnosis not present

## 2021-04-19 DIAGNOSIS — E039 Hypothyroidism, unspecified: Secondary | ICD-10-CM | POA: Diagnosis present

## 2021-04-19 DIAGNOSIS — J90 Pleural effusion, not elsewhere classified: Secondary | ICD-10-CM | POA: Diagnosis not present

## 2021-04-19 DIAGNOSIS — I130 Hypertensive heart and chronic kidney disease with heart failure and stage 1 through stage 4 chronic kidney disease, or unspecified chronic kidney disease: Secondary | ICD-10-CM | POA: Diagnosis present

## 2021-04-19 DIAGNOSIS — I5033 Acute on chronic diastolic (congestive) heart failure: Secondary | ICD-10-CM | POA: Diagnosis present

## 2021-04-19 DIAGNOSIS — N179 Acute kidney failure, unspecified: Secondary | ICD-10-CM | POA: Diagnosis present

## 2021-04-19 DIAGNOSIS — E1151 Type 2 diabetes mellitus with diabetic peripheral angiopathy without gangrene: Secondary | ICD-10-CM | POA: Diagnosis present

## 2021-04-19 DIAGNOSIS — L89150 Pressure ulcer of sacral region, unstageable: Secondary | ICD-10-CM | POA: Diagnosis present

## 2021-04-19 DIAGNOSIS — D689 Coagulation defect, unspecified: Secondary | ICD-10-CM | POA: Diagnosis present

## 2021-04-19 DIAGNOSIS — M6259 Muscle wasting and atrophy, not elsewhere classified, multiple sites: Secondary | ICD-10-CM | POA: Diagnosis not present

## 2021-04-19 DIAGNOSIS — U099 Post covid-19 condition, unspecified: Secondary | ICD-10-CM | POA: Diagnosis not present

## 2021-04-19 DIAGNOSIS — J9601 Acute respiratory failure with hypoxia: Secondary | ICD-10-CM

## 2021-04-19 DIAGNOSIS — I4891 Unspecified atrial fibrillation: Secondary | ICD-10-CM

## 2021-04-19 DIAGNOSIS — I2510 Atherosclerotic heart disease of native coronary artery without angina pectoris: Secondary | ICD-10-CM | POA: Diagnosis present

## 2021-04-19 DIAGNOSIS — Z85828 Personal history of other malignant neoplasm of skin: Secondary | ICD-10-CM | POA: Diagnosis not present

## 2021-04-19 DIAGNOSIS — N1831 Chronic kidney disease, stage 3a: Secondary | ICD-10-CM | POA: Diagnosis present

## 2021-04-19 DIAGNOSIS — I700 Atherosclerosis of aorta: Secondary | ICD-10-CM | POA: Diagnosis not present

## 2021-04-19 DIAGNOSIS — R1311 Dysphagia, oral phase: Secondary | ICD-10-CM | POA: Diagnosis not present

## 2021-04-19 DIAGNOSIS — D849 Immunodeficiency, unspecified: Secondary | ICD-10-CM | POA: Diagnosis present

## 2021-04-19 DIAGNOSIS — E785 Hyperlipidemia, unspecified: Secondary | ICD-10-CM | POA: Diagnosis present

## 2021-04-19 DIAGNOSIS — G9341 Metabolic encephalopathy: Secondary | ICD-10-CM | POA: Diagnosis present

## 2021-04-19 DIAGNOSIS — Z951 Presence of aortocoronary bypass graft: Secondary | ICD-10-CM | POA: Diagnosis not present

## 2021-04-19 DIAGNOSIS — R0602 Shortness of breath: Secondary | ICD-10-CM | POA: Diagnosis not present

## 2021-04-19 DIAGNOSIS — M353 Polymyalgia rheumatica: Secondary | ICD-10-CM | POA: Diagnosis not present

## 2021-04-19 DIAGNOSIS — I252 Old myocardial infarction: Secondary | ICD-10-CM | POA: Diagnosis not present

## 2021-04-19 DIAGNOSIS — E1122 Type 2 diabetes mellitus with diabetic chronic kidney disease: Secondary | ICD-10-CM | POA: Diagnosis present

## 2021-04-19 DIAGNOSIS — J1282 Pneumonia due to coronavirus disease 2019: Secondary | ICD-10-CM | POA: Diagnosis not present

## 2021-04-19 DIAGNOSIS — Z86718 Personal history of other venous thrombosis and embolism: Secondary | ICD-10-CM | POA: Diagnosis not present

## 2021-04-19 DIAGNOSIS — Z7401 Bed confinement status: Secondary | ICD-10-CM | POA: Diagnosis not present

## 2021-04-19 DIAGNOSIS — I517 Cardiomegaly: Secondary | ICD-10-CM | POA: Diagnosis not present

## 2021-04-19 DIAGNOSIS — U071 COVID-19: Secondary | ICD-10-CM | POA: Diagnosis present

## 2021-04-19 DIAGNOSIS — R262 Difficulty in walking, not elsewhere classified: Secondary | ICD-10-CM | POA: Diagnosis not present

## 2021-04-19 DIAGNOSIS — D631 Anemia in chronic kidney disease: Secondary | ICD-10-CM | POA: Diagnosis present

## 2021-04-19 DIAGNOSIS — R2689 Other abnormalities of gait and mobility: Secondary | ICD-10-CM | POA: Diagnosis not present

## 2021-04-19 DIAGNOSIS — G319 Degenerative disease of nervous system, unspecified: Secondary | ICD-10-CM | POA: Diagnosis not present

## 2021-04-19 DIAGNOSIS — I482 Chronic atrial fibrillation, unspecified: Secondary | ICD-10-CM | POA: Diagnosis present

## 2021-04-19 DIAGNOSIS — R278 Other lack of coordination: Secondary | ICD-10-CM | POA: Diagnosis not present

## 2021-04-19 DIAGNOSIS — R4182 Altered mental status, unspecified: Secondary | ICD-10-CM | POA: Diagnosis not present

## 2021-04-19 DIAGNOSIS — I6782 Cerebral ischemia: Secondary | ICD-10-CM | POA: Diagnosis not present

## 2021-04-19 DIAGNOSIS — R0902 Hypoxemia: Secondary | ICD-10-CM | POA: Diagnosis present

## 2021-04-19 DIAGNOSIS — J9621 Acute and chronic respiratory failure with hypoxia: Secondary | ICD-10-CM | POA: Diagnosis present

## 2021-04-19 DIAGNOSIS — J9600 Acute respiratory failure, unspecified whether with hypoxia or hypercapnia: Secondary | ICD-10-CM | POA: Diagnosis not present

## 2021-04-19 DIAGNOSIS — J9811 Atelectasis: Secondary | ICD-10-CM | POA: Diagnosis not present

## 2021-04-19 LAB — FIBRINOGEN: Fibrinogen: >800 mg/dL — ABNORMAL HIGH (ref 210–475)

## 2021-04-19 LAB — URINALYSIS, ROUTINE W REFLEX MICROSCOPIC
Bilirubin Urine: NEGATIVE
Glucose, UA: NEGATIVE mg/dL
Ketones, ur: NEGATIVE mg/dL
Leukocytes,Ua: NEGATIVE
Nitrite: NEGATIVE
Protein, ur: 100 mg/dL — AB
Specific Gravity, Urine: 1.025 (ref 1.005–1.030)
pH: 5.5 (ref 5.0–8.0)

## 2021-04-19 LAB — I-STAT VENOUS BLOOD GAS, ED
Acid-base deficit: 5 mmol/L — ABNORMAL HIGH (ref 0.0–2.0)
Bicarbonate: 19.4 mmol/L — ABNORMAL LOW (ref 20.0–28.0)
Calcium, Ion: 0.98 mmol/L — ABNORMAL LOW (ref 1.15–1.40)
HCT: 22 % — ABNORMAL LOW (ref 36.0–46.0)
Hemoglobin: 7.5 g/dL — ABNORMAL LOW (ref 12.0–15.0)
O2 Saturation: 99 %
Potassium: 3.8 mmol/L (ref 3.5–5.1)
Sodium: 141 mmol/L (ref 135–145)
TCO2: 20 mmol/L — ABNORMAL LOW (ref 22–32)
pCO2, Ven: 31.4 mmHg — ABNORMAL LOW (ref 44.0–60.0)
pH, Ven: 7.398 (ref 7.250–7.430)
pO2, Ven: 142 mmHg — ABNORMAL HIGH (ref 32.0–45.0)

## 2021-04-19 LAB — D-DIMER, QUANTITATIVE: D-Dimer, Quant: 1.07 ug/mL-FEU — ABNORMAL HIGH (ref 0.00–0.50)

## 2021-04-19 LAB — ECHOCARDIOGRAM COMPLETE
AR max vel: 1.47 cm2
AV Area VTI: 1.87 cm2
AV Area mean vel: 1.3 cm2
AV Mean grad: 5 mmHg
AV Peak grad: 9.5 mmHg
Ao pk vel: 1.54 m/s
MV M vel: 5.19 m/s
MV Peak grad: 107.7 mmHg
Radius: 0.3 cm
S' Lateral: 2.9 cm

## 2021-04-19 LAB — HEMOGLOBIN AND HEMATOCRIT, BLOOD
HCT: 28.7 % — ABNORMAL LOW (ref 36.0–46.0)
HCT: 29.8 % — ABNORMAL LOW (ref 36.0–46.0)
Hemoglobin: 9.5 g/dL — ABNORMAL LOW (ref 12.0–15.0)
Hemoglobin: 9.3 g/dL — ABNORMAL LOW (ref 12.0–15.0)

## 2021-04-19 LAB — PROCALCITONIN: Procalcitonin: 19.87 ng/mL

## 2021-04-19 LAB — CBG MONITORING, ED
Glucose-Capillary: 126 mg/dL — ABNORMAL HIGH (ref 70–99)
Glucose-Capillary: 187 mg/dL — ABNORMAL HIGH (ref 70–99)

## 2021-04-19 LAB — BLOOD GAS, VENOUS
Acid-base deficit: 2.7 mmol/L — ABNORMAL HIGH (ref 0.0–2.0)
Bicarbonate: 21.1 mmol/L (ref 20.0–28.0)
Drawn by: 164
FIO2: 21
O2 Saturation: 67.4 %
Patient temperature: 37
pCO2, Ven: 33.2 mmHg — ABNORMAL LOW (ref 44.0–60.0)
pH, Ven: 7.42 (ref 7.250–7.430)
pO2, Ven: 35.6 mmHg (ref 32.0–45.0)

## 2021-04-19 LAB — URINALYSIS, MICROSCOPIC (REFLEX)

## 2021-04-19 LAB — GLUCOSE, CAPILLARY: Glucose-Capillary: 129 mg/dL — ABNORMAL HIGH (ref 70–99)

## 2021-04-19 LAB — VITAMIN B12: Vitamin B-12: 3525 pg/mL — ABNORMAL HIGH (ref 180–914)

## 2021-04-19 LAB — TYPE AND SCREEN
ABO/RH(D): AB POS
Antibody Screen: NEGATIVE

## 2021-04-19 LAB — TSH: TSH: 0.495 u[IU]/mL (ref 0.350–4.500)

## 2021-04-19 LAB — FOLATE: Folate: 32.6 ng/mL (ref >5.9)

## 2021-04-19 LAB — AMMONIA: Ammonia: 14 umol/L (ref 9–35)

## 2021-04-19 MED ORDER — LEVOTHYROXINE SODIUM 50 MCG PO TABS
50.0000 ug | ORAL_TABLET | Freq: Every day | ORAL | Status: DC
  Administered 2021-04-21 – 2021-04-24 (×4): 50 ug via ORAL
  Filled 2021-04-19 (×4): qty 1

## 2021-04-19 MED ORDER — ALBUMIN HUMAN 25 % IV SOLN: 12.5000 g | Freq: Once | INTRAVENOUS | Status: DC

## 2021-04-19 MED ORDER — FUROSEMIDE 10 MG/ML IJ SOLN
40.0000 mg | Freq: Every day | INTRAMUSCULAR | Status: DC
  Administered 2021-04-19 – 2021-04-21 (×3): 40 mg via INTRAVENOUS
  Filled 2021-04-19 (×3): qty 4

## 2021-04-19 MED ORDER — PREDNISONE 5 MG PO TABS
5.0000 mg | ORAL_TABLET | Freq: Every day | ORAL | Status: DC
  Administered 2021-04-20: 08:00:00 5 mg via ORAL
  Filled 2021-04-19 (×2): qty 1

## 2021-04-19 MED ORDER — SENNOSIDES-DOCUSATE SODIUM 8.6-50 MG PO TABS
1.0000 | ORAL_TABLET | Freq: Two times a day (BID) | ORAL | Status: DC
  Administered 2021-04-19 – 2021-04-24 (×9): 1 via ORAL
  Filled 2021-04-19 (×9): qty 1

## 2021-04-19 MED ORDER — METOPROLOL TARTRATE 5 MG/5ML IV SOLN
2.5000 mg | Freq: Three times a day (TID) | INTRAVENOUS | Status: DC
  Administered 2021-04-19 (×2): 2.5 mg via INTRAVENOUS
  Filled 2021-04-19 (×2): qty 5

## 2021-04-19 MED ORDER — ALBUTEROL SULFATE HFA 108 (90 BASE) MCG/ACT IN AERS
2.0000 | INHALATION_SPRAY | Freq: Four times a day (QID) | RESPIRATORY_TRACT | Status: DC | PRN
  Filled 2021-04-19: qty 6.7

## 2021-04-19 MED ORDER — METOPROLOL TARTRATE 5 MG/5ML IV SOLN: 2.5000 mg | Freq: Three times a day (TID) | INTRAVENOUS | Status: DC | PRN

## 2021-04-19 MED ORDER — COLCHICINE 0.6 MG PO TABS
0.6000 mg | ORAL_TABLET | ORAL | Status: DC
  Administered 2021-04-21 – 2021-04-23 (×2): 0.6 mg via ORAL
  Filled 2021-04-19 (×3): qty 1

## 2021-04-19 MED ORDER — ACETAMINOPHEN 650 MG RE SUPP: 650.0000 mg | Freq: Four times a day (QID) | RECTAL | Status: DC | PRN

## 2021-04-19 MED ORDER — POLYETHYLENE GLYCOL 3350 17 G PO PACK
17.0000 g | PACK | Freq: Every day | ORAL | Status: DC
  Administered 2021-04-19 – 2021-04-24 (×4): 17 g via ORAL
  Filled 2021-04-19 (×4): qty 1

## 2021-04-19 MED ORDER — ALBUMIN HUMAN 25 % IV SOLN
12.5000 g | Freq: Every day | INTRAVENOUS | Status: DC
  Administered 2021-04-20: 09:00:00 12.5 g via INTRAVENOUS
  Filled 2021-04-19 (×2): qty 50

## 2021-04-19 MED ORDER — SODIUM CHLORIDE 0.9 % IV SOLN: INTRAVENOUS | Status: DC

## 2021-04-19 MED ORDER — INSULIN ASPART 100 UNIT/ML IJ SOLN
0.0000 [IU] | Freq: Every day | INTRAMUSCULAR | Status: DC
  Administered 2021-04-20 – 2021-04-23 (×4): 3 [IU] via SUBCUTANEOUS

## 2021-04-19 MED ORDER — SODIUM CHLORIDE 0.9% FLUSH
3.0000 mL | Freq: Two times a day (BID) | INTRAVENOUS | Status: DC
  Administered 2021-04-19 – 2021-04-24 (×10): 3 mL via INTRAVENOUS

## 2021-04-19 MED ORDER — DILTIAZEM HCL ER COATED BEADS 120 MG PO CP24
120.0000 mg | ORAL_CAPSULE | Freq: Every day | ORAL | Status: DC
  Administered 2021-04-20: 08:00:00 120 mg via ORAL
  Filled 2021-04-19 (×2): qty 1

## 2021-04-19 MED ORDER — ACETAMINOPHEN 325 MG PO TABS
650.0000 mg | ORAL_TABLET | Freq: Four times a day (QID) | ORAL | Status: DC | PRN
  Administered 2021-04-23: 13:00:00 650 mg via ORAL
  Filled 2021-04-19: qty 2

## 2021-04-19 MED ORDER — INSULIN ASPART 100 UNIT/ML IJ SOLN
0.0000 [IU] | Freq: Three times a day (TID) | INTRAMUSCULAR | Status: DC
  Administered 2021-04-19: 2 [IU] via SUBCUTANEOUS
  Administered 2021-04-19: 1 [IU] via SUBCUTANEOUS
  Administered 2021-04-20: 17:00:00 3 [IU] via SUBCUTANEOUS
  Administered 2021-04-21: 18:00:00 2 [IU] via SUBCUTANEOUS
  Administered 2021-04-21: 13:00:00 3 [IU] via SUBCUTANEOUS
  Administered 2021-04-21: 09:00:00 2 [IU] via SUBCUTANEOUS
  Administered 2021-04-22 (×2): 3 [IU] via SUBCUTANEOUS
  Administered 2021-04-22: 13:00:00 5 [IU] via SUBCUTANEOUS
  Administered 2021-04-23 (×3): 3 [IU] via SUBCUTANEOUS
  Administered 2021-04-24: 11:00:00 2 [IU] via SUBCUTANEOUS
  Administered 2021-04-24: 14:00:00 3 [IU] via SUBCUTANEOUS

## 2021-04-19 MED ORDER — SIMVASTATIN 20 MG PO TABS
40.0000 mg | ORAL_TABLET | Freq: Every evening | ORAL | Status: DC
  Administered 2021-04-19 – 2021-04-23 (×5): 40 mg via ORAL
  Filled 2021-04-19 (×5): qty 2

## 2021-04-19 MED ORDER — CARVEDILOL 25 MG PO TABS
25.0000 mg | ORAL_TABLET | Freq: Two times a day (BID) | ORAL | Status: DC
  Administered 2021-04-19 – 2021-04-21 (×4): 25 mg via ORAL
  Filled 2021-04-19 (×3): qty 1
  Filled 2021-04-19: qty 2
  Filled 2021-04-19: qty 1

## 2021-04-19 NOTE — ED Notes (Signed)
This RN along with NT went in and  repositioned the pt. The pt had a bowel movement on herself. This RN and the Tech cleaned pt up and applied a purwick since admitting wants strict intake and output. This RN will continue to monitor.

## 2021-04-19 NOTE — Evaluation (Signed)
Clinical/Bedside Swallow Evaluation Patient Details  Name: Sheila Morgan MRN: [id number] Date of Birth: 09-16-1932  Today's Date: 04/19/2021 Time: SLP Start Time (ACUTE ONLY): 1810 SLP Stop Time (ACUTE ONLY): 1830 SLP Time Calculation (min) (ACUTE ONLY): 20 min  Past Medical History:  Past Medical History:  Diagnosis Date   Arthritis    Cancer ([place])    skin on nose .  mylenoma   Coronary artery disease    status post post LAD angioplasty in 1993  and subsequent coronary artery bypass grafting x1 with a LIMA to the LAD July of 1994. She had stenting of her RCA with a bare-metal stent November 2002.   Diabetes mellitus    2   Gout    Heart murmur    Hematoma    Right groin   Hemorrhoids    Hiatal hernia    History of blood transfusion    HOH (hard of hearing)    Hyperlipidemia    Hypertension    Left knee DJD    Lightheadedness    has seen Dr Gwenlyn Found and ENT.. cant find out why   Myocardial infarction [facility])    Osteoporosis    PONV (postoperative nausea and vomiting)    Shingles 10/27/2013   Past Surgical History:  Past Surgical History:  Procedure Laterality Date   CORONARY ANGIOPLASTY  1993   LAD   CORONARY ANGIOPLASTY WITH STENT PLACEMENT  03/09/01   RCA   CORONARY ARTERY BYPASS GRAFT  1994   JOINT REPLACEMENT Right 1998   hip   NM MYOVIEW LTD  03/2012   Normal   SKIN GRAFT     to nose skin cancer   TOTAL HIP ARTHROPLASTY     TOTAL KNEE ARTHROPLASTY Left 10/09/2013   Procedure: TOTAL KNEE ARTHROPLASTY;  Surgeon: Lorn Junes, MD;  Location: [place];  Service: Orthopedics;  Laterality: Left;   HPI:  Patient is an 87 y.o. female with PMH: coronary artery disease status post CABG, type 2 diabetes mellitus, paroxysmal atrial fibrillation on Xarelto, hypothyroidism, history of DVT, CKD 3A, and recent hospital admission who returns to the emergency department with somnolence and low oxygen saturation at her SNF.  Patient was admitted to the hospital on 04/05/2021 and  discharged on 12/15. Upon arrival to ED, patient was afebrile, oxygen saturations 100% on 2L supplemental oxygen, she was tachypneic as high as 31 with stable BP. CT chest revealed Small bilateral pleural effusions with associated bibasilar  atelectasis. No definite superimposed focal pulmonary infiltrate. She continues to be on COVID isolation precautions.    Assessment / Plan / Recommendation  Clinical Impression  Patient presents with clinical s/s of dysphagia which appears to be primarily oral phase and with impact from lack of dentition and lack of dentures. Patient consumed thin liquids (water) via consecutive straw sips without overt s/s aspiration or penetration. Puree solids were consumed without difficulty but saltine cracker resulted in prolonged mastication and oral transit. Patient is known to this SLP from recent past admission and at that time recommendation was Dys 3 solids, thin liquids. As patient does not currently have her dentures and she appears weaker, more fatigued than previous admission, recommending initiate Dys 2 solids, thin liquids diet with SLP to follow briefly for toleration and ability to upgrade solids textures. SLP Visit Diagnosis: Dysphagia, unspecified (R13.10)    Aspiration Risk  Mild aspiration risk    Diet Recommendation Thin liquid;Dysphagia 2 (Fine chop)   Liquid Administration via: Cup;Straw Medication Administration: Whole meds  with puree Supervision: Patient able to self feed;Full supervision/cueing for compensatory strategies Compensations: Minimize environmental distractions;Slow rate;Small sips/bites Postural Changes: Seated upright at 90 degrees    Other  Recommendations Oral Care Recommendations: Oral care BID;Staff/trained caregiver to provide oral care    Recommendations for follow up therapy are one component of a multi-disciplinary discharge planning process, led by the attending physician.  Recommendations may be updated based on patient  status, additional functional criteria and insurance authorization.  Follow up Recommendations No SLP follow up      Assistance Recommended at Discharge None  Functional Status Assessment Patient has had a recent decline in their functional status and demonstrates the ability to make significant improvements in function in a reasonable and predictable amount of time.  Frequency and Duration min 1 x/week  1 week       Prognosis Prognosis for Safe Diet Advancement: Good      Swallow Study   General Date of Onset: 04/19/21 HPI: Patient is an 87 y.o. female with PMH: coronary artery disease status post CABG, type 2 diabetes mellitus, paroxysmal atrial fibrillation on Xarelto, hypothyroidism, history of DVT, CKD 3A, and recent hospital admission who returns to the emergency department with somnolence and low oxygen saturation at her SNF.  Patient was admitted to the hospital on 04/05/2021 and discharged on 12/15. Upon arrival to ED, patient was afebrile, oxygen saturations 100% on 2L supplemental oxygen, she was tachypneic as high as 31 with stable BP. CT chest revealed Small bilateral pleural effusions with associated bibasilar  atelectasis. No definite superimposed focal pulmonary infiltrate. She continues to be on COVID isolation precautions. Type of Study: Bedside Swallow Evaluation Previous Swallow Assessment: during recent previous admission Diet Prior to this Study: NPO Temperature Spikes Noted: No Respiratory Status: Nasal cannula History of Recent Intubation: No Behavior/Cognition: Alert;Cooperative;Pleasant mood;Lethargic/Drowsy Oral Cavity Assessment: Other (comment) (minimal amount of secretions cleaned out of oral cavity with toothette swab) Oral Care Completed by SLP: Yes Oral Cavity - Dentition: Edentulous;Dentures, not available;Missing dentition Vision: Functional for self-feeding Self-Feeding Abilities: Needs assist;Needs set up Patient Positioning: Upright in  bed Baseline Vocal Quality: Low vocal intensity Volitional Cough: Cognitively unable to elicit Volitional Swallow: Unable to elicit    Oral/Motor/Sensory Function Overall Oral Motor/Sensory Function: Within functional limits   Ice Chips     Thin Liquid Thin Liquid: Within functional limits Presentation: Straw    Nectar Thick     Honey Thick     Puree Puree: Within functional limits Presentation: Spoon   Solid     Solid: Impaired Oral Phase Impairments: Impaired mastication     Sonia Baller, MA, CCC-SLP Speech Therapy

## 2021-04-19 NOTE — H&P (Signed)
History and Physical    CALLAHAN PEDDIE WUJ:[id number] DOB: 10-Sep-1932 DOA: 04/18/2021  PCP: Binnie Rail, MD   Patient coming from: SNF  Chief Complaint: Not acting like her self, low O2 sat   HPI: Sheila Morgan is a pleasant 87 y.o. female with medical history significant for coronary artery disease status post CABG, type 2 diabetes mellitus, paroxysmal atrial fibrillation on Xarelto, hypothyroidism, history of DVT, CKD 3A, and recent hospital admission who returns to the emergency department with somnolence and low oxygen saturation at her SNF.  Patient was admitted to the hospital on 04/05/2021 and discharged on 12/15.  She presented with confusion, was found to have COVID-19, possible bacterial pneumonia, AKI, and rapid atrial fibrillation.  She was treated with steroids, molnupiravir, IV fluids, and was started on Coreg and diltiazem.  Family reports that her respiratory symptoms had essentially resolved but she seemed to become fatigued and generally weak by time of discharge to SNF.  Yesterday, she was very sleepy when her daughter visited her, would not talk much, did not have any complaints, and felt too tired to eat or drink.  Last night, she was sent to the emergency department for lethargy and reported oxygen saturation of 78% on room air.  ED Course: Upon arrival to the ED, patient is found to be afebrile, saturating 100% on 2 L/min of supplemental oxygen, tachypneic as high as 31, and with stable blood pressure.  CT of the chest/abdomen/pelvis notable for a lobulated cystic structure in the splenic hilum.  Chemistry panel with creatinine 1.44 and albumin 1.7.  CBC notable for leukocytosis to 19,500, slight thrombocytopenia, and hemoglobin 9.6.  INR was 4.8.  Blood and urine cultures were collected and the patient was given a dose of cefepime.  Review of Systems:  Unable to complete ROS secondary to the patient's clinical condition.  Past Medical History:  Diagnosis Date    Arthritis    Cancer ([place])    skin on nose .  mylenoma   Coronary artery disease    status post post LAD angioplasty in 1993  and subsequent coronary artery bypass grafting x1 with a LIMA to the LAD July of 1994. She had stenting of her RCA with a bare-metal stent November 2002.   Diabetes mellitus    2   Gout    Heart murmur    Hematoma    Right groin   Hemorrhoids    Hiatal hernia    History of blood transfusion    HOH (hard of hearing)    Hyperlipidemia    Hypertension    Left knee DJD    Lightheadedness    has seen Dr Gwenlyn Found and ENT.. cant find out why   Myocardial infarction [facility])    Osteoporosis    PONV (postoperative nausea and vomiting)    Shingles 10/27/2013    Past Surgical History:  Procedure Laterality Date   CORONARY ANGIOPLASTY  1993   LAD   CORONARY ANGIOPLASTY WITH STENT PLACEMENT  03/09/01   RCA   CORONARY ARTERY BYPASS GRAFT  1994   JOINT REPLACEMENT Right 1998   hip   NM MYOVIEW LTD  03/2012   Normal   SKIN GRAFT     to nose skin cancer   TOTAL HIP ARTHROPLASTY     TOTAL KNEE ARTHROPLASTY Left 10/09/2013   Procedure: TOTAL KNEE ARTHROPLASTY;  Surgeon: Lorn Junes, MD;  Location: [place];  Service: Orthopedics;  Laterality: Left;    Social History:  reports that she has never smoked. She has never used smokeless tobacco. She reports that she does not drink alcohol and does not use drugs.  No Known Allergies  Family History  Problem Relation Age of Onset   Heart disease Father    Cancer Sister        breast   Diabetes Sister    Heart disease Brother    Heart disease Brother    Heart disease Brother    Diabetes Sister    Stroke Mother      Prior to Admission medications   Medication Sig Start Date End Date Taking? Authorizing Provider  acetaminophen (TYLENOL) 500 MG tablet Take 500 mg by mouth every 6 (six) hours as needed for moderate pain.    [provider]  albuterol (VENTOLIN HFA) 108 (90 Base) MCG/ACT inhaler Inhale 2 puffs  into the lungs every 6 (six) hours as needed for wheezing or shortness of breath. 04/17/21   Kayleen Memos, DO  alendronate (FOSAMAX) 70 MG tablet Take 70 mg by mouth every Saturday. Take with a full glass of water on an empty stomach.    [provider]  Calcium Carbonate-Vitamin D (CALCIUM 600-D PO) Take 1 tablet by mouth 2 (two) times daily.    [provider]  carvedilol (COREG) 25 MG tablet Take 1 tablet (25 mg total) by mouth 2 (two) times daily with a meal. 04/17/21 05/17/21  Kayleen Memos, DO  colchicine 0.6 MG tablet Take 1 tablet by mouth every other day.    [provider]  diltiazem (CARDIZEM CD) 120 MG 24 hr capsule Take 1 capsule (120 mg total) by mouth daily. 04/17/21 05/17/21  Kayleen Memos, DO  Ensure Max Protein (ENSURE MAX PROTEIN) LIQD Take 330 mLs (11 oz total) by mouth daily for 7 days. 04/17/21 04/24/21  Kayleen Memos, DO  feeding supplement, GLUCERNA SHAKE, (GLUCERNA SHAKE) LIQD Take 237 mLs by mouth 2 (two) times daily between meals for 7 days. 04/17/21 04/24/21  Kayleen Memos, DO  glucose blood test strip OneTouch Ultra Test strips  USE TO TEST BLOOD SUGAR TWICE DAILY    [provider]  Lancets (ONETOUCH DELICA PLUS [id number]) MISC USE AS DIRECTED TO TEST BLOOD SUGAR TWICE DAILY 11/04/20   Binnie Rail, MD  levothyroxine (SYNTHROID) 50 MCG tablet TAKE 1 TABLET(50 MCG) BY MOUTH DAILY 09/05/20   Binnie Rail, MD  metFORMIN (GLUCOPHAGE) 500 MG tablet TAKE 1/2 TABLET(250 MG) BY MOUTH DAILY WITH LARGEST MEAL 09/04/20   Binnie Rail, MD  MULTIPLE VITAMIN PO Take 1 tablet by mouth daily.    [provider]  [facility] ULTRA test strip USE TO TEST BLOOD SUGAR TWICE DAILY 05/09/20   Binnie Rail, MD  Rivaroxaban (XARELTO) 15 MG TABS tablet Take 1 tablet (15 mg total) by mouth daily with supper. 04/17/21 07/16/21  Kayleen Memos, DO  simvastatin (ZOCOR) 40 MG tablet TAKE 1 TABLET BY MOUTH IN THE EVENING Patient taking differently: Take 20  mg by mouth every evening. TAKE 1 TABLET BY MOUTH IN THE EVENING 01/14/21   Lorretta Harp, MD  vitamin B-12 (CYANOCOBALAMIN) 1000 MCG tablet Take 1,000 mcg by mouth daily.    [provider]    Physical Exam: Vitals:   04/19/21 0230 04/19/21 0300 04/19/21 0345 04/19/21 0400  BP: 116/70 117/67  115/66  Pulse: (!) 101 (!) 117 (!) 103   Resp: (!) 22 (!) 23 (!) 22 (!) 24  Temp:  TempSrc:      SpO2: 100% 99% 98%     Constitutional: NAD, somnolent   Eyes: PERTLA, lids and conjunctivae normal ENMT: Mucous membranes are moist. Posterior pharynx clear of any exudate or lesions.   Neck: supple, no masses  Respiratory: no wheezing, no crackles. No accessory muscle use.  Cardiovascular: Rate ~80 and irregularly irregular. No significant JVD. Abdomen: No distension, no tenderness, soft. Bowel sounds active.  Musculoskeletal: no clubbing / cyanosis. No joint deformity upper and lower extremities.   Skin: no significant rashes, lesions, ulcers. Warm, dry, well-perfused. Neurologic: CN 2-12 grossly intact. Moving all extremities. Somnolent, wakes briefly to loud voice but not making comprehensible speech.    Labs and Imaging on Admission: I have personally reviewed following labs and imaging studies  CBC: Recent Labs  Lab 04/14/21 0451 04/15/21 0456 04/16/21 0452 04/17/21 0441 04/18/21 2243  WBC 17.2* 19.7* 21.4* 20.3* 19.5*  NEUTROABS 14.8* 16.3* 17.5* 17.2* 16.1*  HGB 11.6* 12.0 11.3* 12.0 9.6*  HCT 36.4 36.1 35.0* 36.8 30.6*  MCV 92.4 93.8 93.3 93.6 96.2  PLT 251 277 249 234 706*   Basic Metabolic Panel: Recent Labs  Lab 04/12/21 0535 04/13/21 0533 04/16/21 0452 04/18/21 2243  NA 140 139 142 134*  K 4.1 3.7 3.7 4.3  CL 109 107 110 105  CO2 24 24 26 23   GLUCOSE 199* 192* 74 155*  BUN 46* 46* 44* 25*  CREATININE 1.27* 1.24* 1.02* 1.44*  CALCIUM 9.4 9.3 8.9 8.3*  MG  --   --  2.4  --   PHOS  --   --  2.8  --    GFR: Estimated Creatinine Clearance: 26.7  mL/min (A) (by C-G formula based on SCr of 1.44 mg/dL (H)). Liver Function Tests: Recent Labs  Lab 04/18/21 2243  AST 19  ALT 37  ALKPHOS 59  BILITOT 1.2  PROT 4.9*  ALBUMIN 1.7*   No results for input(s): LIPASE, AMYLASE in the last 168 hours. No results for input(s): AMMONIA in the last 168 hours. Coagulation Profile: Recent Labs  Lab 04/18/21 2246  INR 4.8*   Cardiac Enzymes: No results for input(s): CKTOTAL, CKMB, CKMBINDEX, TROPONINI in the last 168 hours. BNP (last 3 results) No results for input(s): PROBNP in the last 8760 hours. HbA1C: No results for input(s): HGBA1C in the last 72 hours. CBG: Recent Labs  Lab 04/16/21 1212 04/16/21 1641 04/16/21 2208 04/17/21 0740 04/17/21 1233  GLUCAP 152* 152* 161* 86 70   Lipid Profile: No results for input(s): CHOL, HDL, LDLCALC, TRIG, CHOLHDL, LDLDIRECT in the last 72 hours. Thyroid Function Tests: No results for input(s): TSH, T4TOTAL, FREET4, T3FREE, THYROIDAB in the last 72 hours. Anemia Panel: No results for input(s): VITAMINB12, FOLATE, FERRITIN, TIBC, IRON, RETICCTPCT in the last 72 hours. Urine analysis:    Component Value Date/Time   COLORURINE YELLOW 04/19/2021 0024   APPEARANCEUR CLEAR 04/19/2021 0024   LABSPEC 1.025 04/19/2021 0024   PHURINE 5.5 04/19/2021 0024   GLUCOSEU NEGATIVE 04/19/2021 0024   GLUCOSEU NEGATIVE 02/22/2019 1014   HGBUR SMALL (A) 04/19/2021 0024   BILIRUBINUR NEGATIVE 04/19/2021 0024   KETONESUR NEGATIVE 04/19/2021 0024   PROTEINUR 100 (A) 04/19/2021 0024   UROBILINOGEN 0.2 02/22/2019 1014   NITRITE NEGATIVE 04/19/2021 0024   LEUKOCYTESUR NEGATIVE 04/19/2021 0024   Sepsis Labs: @LABRCNTIP (procalcitonin:4,lacticidven:4) ) Recent Results (from the past 240 hour(s))  Urine Culture     Status: Abnormal   Collection Time: 04/11/21 11:56 AM   Specimen: Urine, Clean  Catch  Result Value Ref Range Status   Specimen Description   Final    URINE, CLEAN CATCH Performed at [facility], [place] [address], [place], [place] [address]    Special Requests   Final    NONE Performed at [facility], [place] [address], [place], [place] [address]    Culture 70,000 COLONIES/mL YEAST (A)  Final   Report Status 04/13/2021 FINAL  Final  Resp Panel by RT-PCR (Flu A&B, Covid) Nasopharyngeal Swab     Status: Abnormal   Collection Time: 04/16/21  5:45 AM   Specimen: Nasopharyngeal Swab; Nasopharyngeal(NP) swabs in vial transport medium  Result Value Ref Range Status   SARS Coronavirus 2 by RT PCR POSITIVE (A) NEGATIVE Final    Comment: (NOTE) SARS-CoV-2 target nucleic acids are DETECTED.  The SARS-CoV-2 RNA is generally detectable in upper respiratory specimens during the acute phase of infection. Positive results are indicative of the presence of the identified virus, but do not rule out bacterial infection or co-infection with other pathogens not detected by the test. Clinical correlation with patient history and other diagnostic information is necessary to determine patient infection status. The expected result is Negative.  Fact Sheet for Patients: EntrepreneurPulse.com.au  Fact Sheet for Healthcare Providers: IncredibleEmployment.be  This test is not yet approved or cleared by the [place] FDA and  has been authorized for detection and/or diagnosis of SARS-CoV-2 by FDA under an Emergency Use Authorization (EUA).  This EUA will remain in effect (meaning this test can be used) for the duration of  the COVID-19 declaration under Section 564(b)(1) of the A ct, 21 U.S.C. section 360bbb-3(b)(1), unless the authorization is terminated or revoked sooner.     Influenza A by PCR NEGATIVE NEGATIVE Final   Influenza B by PCR NEGATIVE NEGATIVE Final    Comment: (NOTE) The Xpert Xpress SARS-CoV-2/FLU/RSV plus assay is intended as an aid in the diagnosis of influenza from Nasopharyngeal swab specimens  and should not be used as a sole basis for treatment. Nasal washings and aspirates are unacceptable for Xpert Xpress SARS-CoV-2/FLU/RSV testing.  Fact Sheet for Patients: EntrepreneurPulse.com.au  Fact Sheet for Healthcare Providers: IncredibleEmployment.be  This test is not yet approved or cleared by the [place] FDA and has been authorized for detection and/or diagnosis of SARS-CoV-2 by FDA under an Emergency Use Authorization (EUA). This EUA will remain in effect (meaning this test can be used) for the duration of the COVID-19 declaration under Section 564(b)(1) of the Act, 21 U.S.C. section 360bbb-3(b)(1), unless the authorization is terminated or revoked.  Performed at [facility], [place] [address] [place],  [address]      Radiological Exams on Admission: CT Chest Wo Contrast  Result Date: 04/19/2021 CLINICAL DATA:  Pneumonia, complication suspected, xray done. Hypoxia. EXAM: CT CHEST WITHOUT CONTRAST TECHNIQUE: Multidetector CT imaging of the chest was performed following the standard protocol without IV contrast. COMPARISON:  None. FINDINGS: Cardiovascular: Extensive multi-vessel coronary artery calcification. Coronary artery bypass grafting has been performed. Global cardiac size is within normal limits. No pericardial effusion. Central pulmonary arteries are of normal caliber. Moderate atherosclerotic calcification within the thoracic aorta. No aortic aneurysm. Mediastinum/Nodes: No enlarged mediastinal or axillary lymph nodes. Thyroid gland, trachea, and esophagus demonstrate no significant findings. Lungs/Pleura: Small bilateral pleural effusions are present with associated bibasilar atelectasis, right greater than left. No superimposed focal pulmonary nodules or infiltrates. No pneumothorax. Central airways are widely patent. Upper Abdomen: A lobulated cystic structure is  seen within the a splenic hilum  intimately associated with the tail the pancreas and the gastric fundus. This is indeterminate and may represent a cystic neoplasm of the pancreas, a pancreatic cyst CIS, gastric diverticulum, or potentially sequela of remote trauma. This is not well characterized on this noncontrast examination. Cholelithiasis noted. Musculoskeletal: Osseous structures are age-appropriate. No acute bone abnormality. IMPRESSION: Small bilateral pleural effusions with associated bibasilar atelectasis. No definite superimposed focal pulmonary infiltrate. Cholelithiasis. Lobulated cystic structure within the splenic hilum, poorly characterized on this examination. Comparison with prior examinations, if available, would be helpful in determining chronicity. If none are available, this would be better assessed with dedicated pancreatic protocol CT or MRI examination, if clinically indicated. Aortic Atherosclerosis (ICD10-I70.0). Electronically Signed   By: Fidela Salisbury M.D.   On: 04/19/2021 01:00   DG Chest Port 1 View  Result Date: 04/18/2021 CLINICAL DATA:  Hypoxia and decreased mental alertness. Possible sepsis. EXAM: PORTABLE CHEST 1 VIEW COMPARISON:  Portable chest 04/11/2021. FINDINGS: The heart is enlarged. The aorta is tortuous, ectatic and calcified. Old CABG change. Central vessels are normal in caliber. There are small layering pleural effusions with overlying haziness in the lower lung fields consistent with atelectasis or pneumonia. The mid and upper lungs clear with COPD change. Overall aeration is not significantly changed. No new abnormality. Osteopenia. IMPRESSION: Small pleural effusions with bibasilar atelectasis or consolidation. Cardiomegaly. No new abnormality or worsening. Electronically Signed   By: Telford Nab M.D.   On: 04/18/2021 23:39    EKG: Independently reviewed. Artifact, rate 82, QTc 442 ms.   Assessment/Plan  1. Acute encephalopathy  - P/w somnolence after being discharged the day prior  - Per daughter pt has been increasingly lethargic since discharge and was too tired to eat or drink yesterday but not complaining of anything  - Check head CT, blood gas, TSH, ammonia, B12, and folate   2. Acute hypoxic respiratory failure  - Said to have O2 sat in upper 70s on rm air at SNF, saturating close to 100% on 2 Lpm in ED  - No acute findings on chest CT in ED  - PE unlikely but possible, checking DIC labs as discussed below  - Continue supplemental O2 if needed    3. SIRS  - Leukocytosis and tachypnea noted on admission  - No evident source of infection on exam or review of UA and CT chest/abd/pelvis - WBC elevated in setting of recent steroid use and has decreased slightly  - Lactate normal  - Blood and urine cultures collected in ED and a dose of cefepime was given  - Check procalcitonin, monitor cultures and clinical course, hold further antibiotic for now    4. Coagulopathy; anemia  - Pt on Xarelo has PT 45.2 and PTT 54 with slight thrombocytopenia and 2.4 g drop in Hgb in one day  - PT/INR higher than would be expected for Xarelto  - No overt bleeding  - Check FOBT, d-dimer, fibrinogen, and repeat CBC; hold Xarelto for now    5. Paroxysmal atrial fibrillation  - Xarelto held on admission as above in setting of INR is 4.8 and decreased Hgb, continue Coreg and diltiazem as tolerated   6. AKI superimposed on CKD IIIa   - SCr is 1.44 on admission, up from 1.02 two days earlier  - Likely acute prerenal azotemia in setting of poor oral intake  - Start IVF hydration, renally-dose medications, repeat chem panel in am    7. Type II DM  -  A1c was 7.2% in December 2022 - Check CBGs and use low-intensity SSI for now     DVT prophylaxis: Xarelto pta, SCDs for now  Code Status: Full, confirmed with family  Level of Care: Level of care: Telemetry Medical Family Communication: Daughter by phone  Disposition Plan:  Patient is from: SNF  Anticipated d/c is to: SNF   Anticipated d/c date is: 12/19 or 04/22/21 Patient currently: pending encephalopathy workup Consults called: none  Admission status: Inpatient     Vianne Bulls, MD [facility]  04/19/2021, 4:31 AM

## 2021-04-19 NOTE — ED Notes (Signed)
Fed pt breakfast

## 2021-04-19 NOTE — Progress Notes (Signed)
°  Echocardiogram 2D Echocardiogram has been performed.  Merrie Roof F 04/19/2021, 3:37 PM

## 2021-04-19 NOTE — Progress Notes (Addendum)
Patient is admitted early this morning, detail please refer to HPI H/o coronary artery disease status post CABG, non-insulin-dependent type 2 diabetes mellitus, history of DVT, paroxysmal atrial fibrillation on Xarelto, hypothyroidism, CKD 3A, and recent hospital admission from 12/3-12/15 for COVID-19 pneumonia and A. fib RVR who returns to the emergency department with somnolence and low oxygen saturation at her SNF.    She is seen and examined, she continues have tachypnea, slight intermittent afib/rvr,, blood pressure stable, on 02 supplement 96% on room air (in the emergency room it was 78% on room air)  Acute hypoxic respiratory failure -She appears edematous, albumin 1.7, will repeat echo, start iv lasix with albumin, strict intake and output, daily weight -Per RN, she failed bedside swallow eval, currently npo, awaiting speech eval, continue aspiration precaution -She knows she is at [facility] cone, she is not oriented to time  Acute metabolic encephalopathy Daughter reports patient became more drowsy over the last two days since she left [place] long [facility] head no acute findings, she has been on xarelto for 77months ago  Blood culture no growth  Urine culture in progress Ct chest did not review acute infiltrate TSH ammonia unremarkable She does has hypoxia which could contribute to encephalopathy  Glucose range from 74 to 155,  Might need temporary core track feeding tube if he does not improve by tomorrow, daughter made aware and agreed to proceed if indicated  Acute anemia Monitor Hgb, follow-up FOBT ordered and RN made aware , pending collection Monitor hgb, does not appear to have external bleed, suspect drop of hemoglobin due to hemodilution from volume overload, per RN patient had a smear of stool color was light brown, stool dried up before she could collect Hemoccult cart Hold anticoagulation for now, resume if no further drop of hgn, and able to rule out bleed  Leukocytosis,   normal lactic acid, UA with proteinuria, rare bacteria,  urine culture in process,  blood culture in process,  she was given cefepimex1  in the ED Per last discharge summary leukocytosis was thought due to steroid use Procalcitonin is elevated at 19.87, 3 days ago was 0.2, chest CT (results listed below) no acute infiltrate Will hold antibiotic for now, monitor CBC  INR evaluation in the setting of Xarelto use is not reliable, her labs is not consistent with DIC, her platelet 148, with elevated fibrinogen level.  AFIB, was discharged on coreg/cardizem, currently npo due to failed swallow eval, start on iv lopressor with holding parameters hole anticoagulation for now, resume if rule out acute bleed     Daughter reports patient takes prednisone 5mg  daily for at least a year or so from her rheumatology Dr Buck Mam, resume prednisone 5mg  daily  Recent COVID infection, first test positive on December 3, continue test positive on 12/14 Will not repeat COVID testing ,will do total 21 days isolation from 12/3 since patient is considered immunosuppressed status   CTchest in the ED: Small bilateral pleural effusions with associated bibasilar atelectasis. No definite superimposed focal pulmonary infiltrate.   Cholelithiasis.   Lobulated cystic structure within the splenic hilum, poorly characterized on this examination. Comparison with prior examinations, if available, would be helpful in determining chronicity. If none are available, this would be better assessed with dedicated pancreatic protocol CT or MRI examination, if clinically indicated.   Aortic Atherosclerosis (ICD10-I70.0).

## 2021-04-19 NOTE — Progress Notes (Signed)
Pt admitted from ED with care RN and daughter at bedside. Airborne precautions initiated, due to recent positive COVID test on 12/14. Pt transferred by sliding method from stretcher to bed. Assessment (including double RN skin check) and vital signs performed. Pt left alone in room with bed alarm turned on.

## 2021-04-20 ENCOUNTER — Inpatient Hospital Stay (HOSPITAL_COMMUNITY): Payer: Medicare Other

## 2021-04-20 LAB — C-REACTIVE PROTEIN: CRP: 36.1 mg/dL — ABNORMAL HIGH (ref <1.0)

## 2021-04-20 LAB — CBC
HCT: 26.1 % — ABNORMAL LOW (ref 36.0–46.0)
Hemoglobin: 8.2 g/dL — ABNORMAL LOW (ref 12.0–15.0)
MCH: 29.7 pg (ref 26.0–34.0)
MCHC: 31.4 g/dL (ref 30.0–36.0)
MCV: 94.6 fL (ref 80.0–100.0)
Platelets: 140 10*3/uL — ABNORMAL LOW (ref 150–400)
RBC: 2.76 MIL/uL — ABNORMAL LOW (ref 3.87–5.11)
RDW: 15.7 % — ABNORMAL HIGH (ref 11.5–15.5)
WBC: 15.3 10*3/uL — ABNORMAL HIGH (ref 4.0–10.5)
nRBC: 0 % (ref 0.0–0.2)

## 2021-04-20 LAB — COMPREHENSIVE METABOLIC PANEL
ALT: 25 U/L (ref 0–44)
AST: 16 U/L (ref 15–41)
Albumin: <1.5 g/dL — ABNORMAL LOW (ref 3.5–5.0)
Alkaline Phosphatase: 62 U/L (ref 38–126)
Anion gap: 7 (ref 5–15)
BUN: 28 mg/dL — ABNORMAL HIGH (ref 8–23)
CO2: 22 mmol/L (ref 22–32)
Calcium: 8.1 mg/dL — ABNORMAL LOW (ref 8.9–10.3)
Chloride: 108 mmol/L (ref 98–111)
Creatinine, Ser: 1.36 mg/dL — ABNORMAL HIGH (ref 0.44–1.00)
GFR, Estimated: 38 mL/min — ABNORMAL LOW (ref >60)
Glucose, Bld: 129 mg/dL — ABNORMAL HIGH (ref 70–99)
Potassium: 3.9 mmol/L (ref 3.5–5.1)
Sodium: 137 mmol/L (ref 135–145)
Total Bilirubin: 1.3 mg/dL — ABNORMAL HIGH (ref 0.3–1.2)
Total Protein: 4.9 g/dL — ABNORMAL LOW (ref 6.5–8.1)

## 2021-04-20 LAB — HEMOGLOBIN AND HEMATOCRIT, BLOOD
HCT: 27.8 % — ABNORMAL LOW (ref 36.0–46.0)
Hemoglobin: 8.7 g/dL — ABNORMAL LOW (ref 12.0–15.0)

## 2021-04-20 LAB — URINE CULTURE: Culture: NO GROWTH

## 2021-04-20 LAB — GLUCOSE, CAPILLARY
Glucose-Capillary: 106 mg/dL — ABNORMAL HIGH (ref 70–99)
Glucose-Capillary: 96 mg/dL (ref 70–99)
Glucose-Capillary: 238 mg/dL — ABNORMAL HIGH (ref 70–99)
Glucose-Capillary: 256 mg/dL — ABNORMAL HIGH (ref 70–99)

## 2021-04-20 LAB — PROTIME-INR
INR: 1.9 — ABNORMAL HIGH (ref 0.8–1.2)
Prothrombin Time: 22 seconds — ABNORMAL HIGH (ref 11.4–15.2)

## 2021-04-20 LAB — URIC ACID: Uric Acid, Serum: 6.5 mg/dL (ref 2.5–7.1)

## 2021-04-20 LAB — MRSA NEXT GEN BY PCR, NASAL: MRSA by PCR Next Gen: NOT DETECTED

## 2021-04-20 LAB — PROCALCITONIN: Procalcitonin: 19.5 ng/mL

## 2021-04-20 LAB — BRAIN NATRIURETIC PEPTIDE: B Natriuretic Peptide: 982.9 pg/mL — ABNORMAL HIGH (ref 0.0–100.0)

## 2021-04-20 MED ORDER — DILTIAZEM HCL 25 MG/5ML IV SOLN
10.0000 mg | Freq: Four times a day (QID) | INTRAVENOUS | Status: DC | PRN
  Filled 2021-04-20: qty 5

## 2021-04-20 MED ORDER — DILTIAZEM HCL 60 MG PO TABS
90.0000 mg | ORAL_TABLET | Freq: Four times a day (QID) | ORAL | Status: DC
  Administered 2021-04-20 – 2021-04-21 (×3): 90 mg via ORAL
  Filled 2021-04-20 (×3): qty 2

## 2021-04-20 MED ORDER — DIGOXIN 0.25 MG/ML IJ SOLN
0.2500 mg | Freq: Four times a day (QID) | INTRAMUSCULAR | Status: AC
  Administered 2021-04-20 (×2): 0.25 mg via INTRAVENOUS
  Filled 2021-04-20 (×2): qty 2

## 2021-04-20 MED ORDER — POTASSIUM CHLORIDE CRYS ER 20 MEQ PO TBCR
40.0000 meq | EXTENDED_RELEASE_TABLET | Freq: Once | ORAL | Status: AC
  Administered 2021-04-20: 12:00:00 40 meq via ORAL
  Filled 2021-04-20: qty 2

## 2021-04-20 MED ORDER — RIVAROXABAN 15 MG PO TABS
15.0000 mg | ORAL_TABLET | Freq: Every day | ORAL | Status: DC
  Administered 2021-04-20 – 2021-04-23 (×4): 15 mg via ORAL
  Filled 2021-04-20 (×4): qty 1

## 2021-04-20 MED ORDER — INSULIN GLARGINE-YFGN 100 UNIT/ML ~~LOC~~ SOLN
6.0000 [IU] | Freq: Every day | SUBCUTANEOUS | Status: DC
  Administered 2021-04-20 – 2021-04-23 (×4): 6 [IU] via SUBCUTANEOUS
  Filled 2021-04-20 (×4): qty 0.06

## 2021-04-20 MED ORDER — DEXAMETHASONE SODIUM PHOSPHATE 10 MG/ML IJ SOLN
6.0000 mg | INTRAMUSCULAR | Status: DC
  Administered 2021-04-20 – 2021-04-24 (×5): 6 mg via INTRAVENOUS
  Filled 2021-04-20 (×5): qty 1

## 2021-04-20 NOTE — Progress Notes (Signed)
Patient tested positive for Covid on 04/05/21. Removed airborne, contact precautions per MD.

## 2021-04-20 NOTE — Plan of Care (Cosign Needed)

## 2021-04-20 NOTE — Progress Notes (Signed)
PROGRESS NOTE                                                                                                                                                                                                             Patient Demographics:    Sheila Morgan, is a 87 y.o. female, DOB - 09-02-1932, EVO:[id number]  Outpatient Primary MD for the patient is Binnie Rail, MD    LOS - 1  Admit date - 04/18/2021    Chief Complaint  Patient presents with   Altered Mental Status       Brief Narrative (HPI from H&P) -  Sheila Morgan is a pleasant 87 y.o. female with medical history significant for coronary artery disease status post CABG, type 2 diabetes mellitus, paroxysmal atrial fibrillation on Xarelto, hypothyroidism, history of DVT, CKD 3A, and recent hospital admission who returns to the emergency department with somnolence and low oxygen saturation at her SNF.   Subjective:    Sheila Morgan today has, No headache, No chest pain, No abdominal pain - No Nausea, No new weakness tingling or numbness,  no SOB.   Assessment  & Plan :     Acute metabolic encephalopathy due to acute on chronic hypoxic respiratory failure caused by CHF -this is acute on chronic diastolic CHF EF 29%, precipitated by RVR, continue Lasix, oxygenation and mentation improving, CT head unremarkable, no focal deficits, continue to monitor with diuretics, oxygen supplementation and supportive care.  2.  Chronic A. fib in RVR.  Mali vas 2 score of greater than 3.  Continue beta-blocker, Cardizem dose increased for better rate control, digoxin 2 doses on 04/20/2021, resumed Xarelto for anticoagulation.  3.  COVID-19 infection.  Initially diagnosed on 04/05/2021.  CT chest unremarkable with no infiltrate, CRP was quite elevated hence trial of Decadron and monitor.  4.  Hypothyroidism on Synthroid with stable TSH.  5.  CKD 3A.  Baseline creatinine around 1.3.   Monitor.  6.  Dyslipidemia.  On statin.  7.  CAD s/p CABG.  No acute issues.  On beta-blocker and statin for secondary prevention along with Xarelto instead of aspirin.  8. DM Type II.  On sliding scale, since placed on Decadron will add low-dose Lantus and monitor.  Lab Results  Component Value Date   HGBA1C 7.2 (H) 04/05/2021  CBG (last 3)  Recent Labs    04/19/21 1316 04/19/21 1828 04/20/21 0750  GLUCAP 187* 129* 106*         Condition - Extremely Guarded  Family Communication  :  None present  Code Status :  Full  Consults  :  None  PUD Prophylaxis :     Procedures  :     TTE - 1. Left ventricular ejection fraction, by estimation, is 50%. The left ventricle has mildly decreased function. The left ventricle demonstrates global hypokinesis. There is mild concentric left ventricular hypertrophy. Left ventricular diastolic parameters are indeterminate.  2. Right ventricular systolic function was not well visualized. The right ventricular size is normal. There is mildly elevated pulmonary artery systolic pressure. The estimated right ventricular systolic pressure is 32.6 mmHg.  3. Left atrial size was moderately dilated.  4. The mitral valve is degenerative. Mild mitral valve regurgitation.  5. Tricuspid valve regurgitation is moderate.  6. The aortic valve is tricuspid. There is mild calcification of the aortic valve. There is mild thickening of the aortic valve. Aortic valve regurgitation is not visualized. Aortic valve sclerosis is present, with no evidence of aortic valve stenosis  CT chest- Small bilateral pleural effusions with associated bibasilar atelectasis. No definite superimposed focal pulmonary infiltrate. Cholelithiasis. Lobulated cystic structure within the splenic hilum, poorly characterized on this examination. Comparison with prior examinations, if available, would be helpful in determining chronicity. If none are available, this would be better assessed  with dedicated pancreatic protocol CT or MRI examination, if clinically indicated. Aortic Atherosclerosis   CT Head - non acute      Disposition Plan  :    Status is: Inpatient  Remains inpatient appropriate because: Afib  DVT Prophylaxis  :  Xaralto  SCDs Start: 04/19/21 0345     Lab Results  Component Value Date   PLT 140 (L) 04/20/2021    Diet :  Diet Order             DIET DYS 2 Room service appropriate? No; Fluid consistency: Thin  Diet effective now                    Inpatient Medications  Scheduled Meds:  carvedilol  25 mg Oral BID WC   colchicine  0.6 mg Oral QODAY   dexamethasone (DECADRON) injection  6 mg Intravenous Q24H   digoxin  0.25 mg Intravenous Q6H   diltiazem  90 mg Oral Q6H   furosemide  40 mg Intravenous Daily   insulin aspart  0-5 Units Subcutaneous QHS   insulin aspart  0-9 Units Subcutaneous TID WC   levothyroxine  50 mcg Oral Q0600   polyethylene glycol  17 g Oral Daily   potassium chloride  40 mEq Oral Once   senna-docusate  1 tablet Oral BID   simvastatin  40 mg Oral QPM   sodium chloride flush  3 mL Intravenous Q12H   Continuous Infusions:  albumin human     albumin human 12.5 g (04/20/21 0831)   PRN Meds:.acetaminophen **OR** acetaminophen, albuterol, diltiazem, metoprolol tartrate  Antibiotics  :    Anti-infectives (From admission, onward)    Start     Dose/Rate Route Frequency Ordered Stop   04/19/21 0000  ceFEPIme (MAXIPIME) 2 g in sodium chloride 0.9 % 100 mL IVPB        2 g 200 mL/hr over 30 Minutes Intravenous  Once 04/18/21 2353 04/19/21 0156  Time Spent in minutes  30   Sheila Morgan M.D on 04/20/2021 at 11:31 AM  To page go to [email or web address]   [facility] -  Office  [phone]  See all Orders from today for further details    Objective:   Vitals:   04/20/21 0000 04/20/21 0344 04/20/21 0349 04/20/21 0746  BP: 105/69 120/69  127/81  Pulse: (!) 107 (!) 105  (!) 105  Resp: 18 20   18   Temp: 98.7 F (37.1 C) 98.5 F (36.9 C)  98 F (36.7 C)  TempSrc: Oral Oral  Axillary  SpO2: 97% 99%  99%  Weight:   70.2 kg   Height:        Wt Readings from Last 3 Encounters:  04/20/21 70.2 kg  04/05/21 68.9 kg  01/23/21 71.2 kg     Intake/Output Summary (Last 24 hours) at 04/20/2021 1131 Last data filed at 04/20/2021 1039 Gross per 24 hour  Intake 851.67 ml  Output 600 ml  Net 251.67 ml     Physical Exam  Awake Alert, No new F.N deficits, Normal affect [place].AT,PERRAL Supple Neck, No JVD,   Symmetrical Chest wall movement, Good air movement bilaterally, CTAB iRRR,No Gallops,Rubs or new Murmurs,  +ve B.Sounds, Abd Soft, No tenderness,   No Cyanosis, Clubbing or edema     RN pressure injury documentation: Pressure Injury 04/19/21 Sacrum Lower;Posterior Unstageable - Full thickness tissue loss in which the base of the injury is covered by slough (yellow, tan, gray, green or brown) and/or eschar (tan, brown or black) in the wound bed. wound consult placed (Active)  04/19/21 1755  Location: Sacrum  Location Orientation: Lower;Posterior  Staging: Unstageable - Full thickness tissue loss in which the base of the injury is covered by slough (yellow, tan, gray, green or brown) and/or eschar (tan, brown or black) in the wound bed.  Wound Description (Comments): wound consult placed  Present on Admission: Yes     Data Review:    CBC Recent Labs  Lab 04/14/21 0451 04/15/21 0456 04/16/21 0452 04/17/21 0441 04/18/21 2243 04/19/21 1139 04/19/21 1256 04/19/21 1806 04/20/21 0105  WBC 17.2* 19.7* 21.4* 20.3* 19.5*  --   --   --  15.3*  HGB 11.6* 12.0 11.3* 12.0 9.6* 7.5* 9.5* 9.3* 8.2*  HCT 36.4 36.1 35.0* 36.8 30.6* 22.0* 28.7* 29.8* 26.1*  PLT 251 277 249 234 148*  --   --   --  140*  MCV 92.4 93.8 93.3 93.6 96.2  --   --   --  94.6  MCH 29.4 31.2 30.1 30.5 30.2  --   --   --  29.7  MCHC 31.9 33.2 32.3 32.6 31.4  --   --   --  31.4  RDW 14.1 14.6 15.0 15.2  15.5  --   --   --  15.7*  LYMPHSABS 0.7 0.7 1.0 1.0 0.7  --   --   --   --   MONOABS 0.9 1.7* 2.0* 1.6* 2.5*  --   --   --   --   EOSABS 0.0 0.0 0.1 0.1 0.0  --   --   --   --   BASOSABS 0.1 0.0 0.1 0.0 0.0  --   --   --   --     Electrolytes Recent Labs  Lab 04/16/21 0452 04/18/21 2243 04/18/21 2246 04/18/21 2250 04/19/21 0530 04/19/21 1139 04/20/21 0105  NA 142 134*  --   --   --  141  137  K 3.7 4.3  --   --   --  3.8 3.9  CL 110 105  --   --   --   --  108  CO2 26 23  --   --   --   --  22  GLUCOSE 74 155*  --   --   --   --  129*  BUN 44* 25*  --   --   --   --  28*  CREATININE 1.02* 1.44*  --   --   --   --  1.36*  CALCIUM 8.9 8.3*  --   --   --   --  8.1*  AST  --  19  --   --   --   --  16  ALT  --  37  --   --   --   --  25  ALKPHOS  --  59  --   --   --   --  62  BILITOT  --  1.2  --   --   --   --  1.3*  ALBUMIN  --  1.7*  --   --   --   --  <1.5*  MG 2.4  --   --   --   --   --   --   CRP  --   --   --   --  36.1*  --   --   DDIMER  --   --   --   --  1.07*  --   --   PROCALCITON 0.20  --   --   --  19.87  --  19.50  LATICACIDVEN  --   --   --  1.0  --   --   --   INR  --   --  4.8*  --   --   --  1.9*  TSH  --   --   --   --  0.495  --   --   AMMONIA  --   --   --   --  14  --   --   BNP  --   --   --   --   --   --  982.9*    ------------------------------------------------------------------------------------------------------------------ No results for input(s): CHOL, HDL, LDLCALC, TRIG, CHOLHDL, LDLDIRECT in the last 72 hours.  Lab Results  Component Value Date   HGBA1C 7.2 (H) 04/05/2021    Recent Labs    04/19/21 0530  TSH 0.495       Radiology Reports CT HEAD WO CONTRAST (5MM)  Result Date: 04/19/2021 CLINICAL DATA:  87 year old female with history of mental status change. EXAM: CT HEAD WITHOUT CONTRAST TECHNIQUE: Contiguous axial images were obtained from the base of the skull through the vertex without intravenous contrast. COMPARISON:   Head CT 04/05/2021. FINDINGS: Brain: Mild cerebral atrophy. Patchy and confluent areas of decreased attenuation are noted throughout the deep and periventricular white matter of the cerebral hemispheres bilaterally, compatible with chronic microvascular ischemic disease. No evidence of acute infarction, hemorrhage, hydrocephalus, extra-axial collection or mass lesion/mass effect. Vascular: No hyperdense vessel or unexpected calcification. Skull: Normal. Negative for fracture or focal lesion. Sinuses/Orbits: No acute finding. Other: None. IMPRESSION: 1. No acute intracranial abnormalities. 2. Mild cerebral atrophy with extensive chronic microvascular ischemic changes in the cerebral white matter redemonstrated, as above. Electronically Signed   By: Vinnie Langton M.D.   On:  04/19/2021 05:10   CT Chest Wo Contrast  Result Date: 04/19/2021 CLINICAL DATA:  Pneumonia, complication suspected, xray done. Hypoxia. EXAM: CT CHEST WITHOUT CONTRAST TECHNIQUE: Multidetector CT imaging of the chest was performed following the standard protocol without IV contrast. COMPARISON:  None. FINDINGS: Cardiovascular: Extensive multi-vessel coronary artery calcification. Coronary artery bypass grafting has been performed. Global cardiac size is within normal limits. No pericardial effusion. Central pulmonary arteries are of normal caliber. Moderate atherosclerotic calcification within the thoracic aorta. No aortic aneurysm. Mediastinum/Nodes: No enlarged mediastinal or axillary lymph nodes. Thyroid gland, trachea, and esophagus demonstrate no significant findings. Lungs/Pleura: Small bilateral pleural effusions are present with associated bibasilar atelectasis, right greater than left. No superimposed focal pulmonary nodules or infiltrates. No pneumothorax. Central airways are widely patent. Upper Abdomen: A lobulated cystic structure is seen within the a splenic hilum intimately associated with the tail the pancreas and the  gastric fundus. This is indeterminate and may represent a cystic neoplasm of the pancreas, a pancreatic cyst CIS, gastric diverticulum, or potentially sequela of remote trauma. This is not well characterized on this noncontrast examination. Cholelithiasis noted. Musculoskeletal: Osseous structures are age-appropriate. No acute bone abnormality. IMPRESSION: Small bilateral pleural effusions with associated bibasilar atelectasis. No definite superimposed focal pulmonary infiltrate. Cholelithiasis. Lobulated cystic structure within the splenic hilum, poorly characterized on this examination. Comparison with prior examinations, if available, would be helpful in determining chronicity. If none are available, this would be better assessed with dedicated pancreatic protocol CT or MRI examination, if clinically indicated. Aortic Atherosclerosis (ICD10-I70.0). Electronically Signed   By: Fidela Salisbury M.D.   On: 04/19/2021 01:00   DG Chest Port 1 View  Result Date: 04/20/2021 CLINICAL DATA:  Shortness of breath EXAM: PORTABLE CHEST 1 VIEW COMPARISON:  CT chest performed on the same date. FINDINGS: The heart is enlarged. Evidence of prior coronary artery bypass grafting. Small bilateral pleural effusions with bibasilar atelectasis. IMPRESSION: 1.  Cardiomegaly. 2.  Small bilateral pleural effusions with bibasilar atelectasis. Electronically Signed   By: Keane Police D.O.   On: 04/20/2021 09:08   DG Chest Port 1 View  Result Date: 04/18/2021 CLINICAL DATA:  Hypoxia and decreased mental alertness. Possible sepsis. EXAM: PORTABLE CHEST 1 VIEW COMPARISON:  Portable chest 04/11/2021. FINDINGS: The heart is enlarged. The aorta is tortuous, ectatic and calcified. Old CABG change. Central vessels are normal in caliber. There are small layering pleural effusions with overlying haziness in the lower lung fields consistent with atelectasis or pneumonia. The mid and upper lungs clear with COPD change. Overall aeration is not  significantly changed. No new abnormality. Osteopenia. IMPRESSION: Small pleural effusions with bibasilar atelectasis or consolidation. Cardiomegaly. No new abnormality or worsening. Electronically Signed   By: Telford Nab M.D.   On: 04/18/2021 23:39   ECHOCARDIOGRAM COMPLETE  Result Date: 04/19/2021    ECHOCARDIOGRAM REPORT   Patient Name:   JOJO GEVING Date of Exam: 04/19/2021 Medical Rec #:  [id number]       Height:       64.0 in Accession #:    [id number]      Weight:       151.9 lb Date of Birth:  [birth year]       BSA:          1.740 m Patient Age:    46 years        BP:           145/93 mmHg Patient Gender: F  HR:           93 bpm. Exam Location:  Inpatient Procedure: 2D Echo, Cardiac Doppler and Color Doppler Indications:    Atrial fibrillation  History:        Patient has prior history of Echocardiogram examinations, most                 recent 12/27/2020. Previous Myocardial Infarction and CAD,                 Arrythmias:Atrial Fibrillation; Risk Factors:Hypertension and                 Dyslipidemia.  Sonographer:    Merrie Roof RDCS Referring Phys: [id number] [place]  1. Left ventricular ejection fraction, by estimation, is 50%. The left ventricle has mildly decreased function. The left ventricle demonstrates global hypokinesis. There is mild concentric left ventricular hypertrophy. Left ventricular diastolic parameters are indeterminate.  2. Right ventricular systolic function was not well visualized. The right ventricular size is normal. There is mildly elevated pulmonary artery systolic pressure. The estimated right ventricular systolic pressure is 62.9 mmHg.  3. Left atrial size was moderately dilated.  4. The mitral valve is degenerative. Mild mitral valve regurgitation.  5. Tricuspid valve regurgitation is moderate.  6. The aortic valve is tricuspid. There is mild calcification of the aortic valve. There is mild thickening of the aortic valve. Aortic valve  regurgitation is not visualized. Aortic valve sclerosis is present, with no evidence of aortic valve stenosis. Comparison(s): A prior study was performed on 12/27/20. In AF RVR, LV appears decrease and mitral and tricuspid valve disease appear worse. FINDINGS  Left Ventricle: Left ventricular ejection fraction, by estimation, is 50%. The left ventricle has mildly decreased function. The left ventricle demonstrates global hypokinesis. The left ventricular internal cavity size was normal in size. There is mild concentric left ventricular hypertrophy. Left ventricular diastolic parameters are indeterminate. Right Ventricle: The right ventricular size is normal. No increase in right ventricular wall thickness. Right ventricular systolic function was not well visualized. There is mildly elevated pulmonary artery systolic pressure. The tricuspid regurgitant velocity is 3.14 m/s, and with an assumed right atrial pressure of 3 mmHg, the estimated right ventricular systolic pressure is 47.6 mmHg. Left Atrium: Left atrial size was moderately dilated. Right Atrium: Right atrial size was normal in size. Pericardium: There is no evidence of pericardial effusion. Mitral Valve: The mitral valve is degenerative in appearance. Mild to moderate mitral annular calcification. Mild mitral valve regurgitation. Tricuspid Valve: The tricuspid valve is normal in structure. Tricuspid valve regurgitation is moderate . No evidence of tricuspid stenosis. Aortic Valve: The aortic valve is tricuspid. There is mild calcification of the aortic valve. There is mild thickening of the aortic valve. There is mild aortic valve annular calcification. Aortic valve regurgitation is not visualized. Aortic valve sclerosis is present, with no evidence of aortic valve stenosis. Aortic valve mean gradient measures 5.0 mmHg. Aortic valve peak gradient measures 9.5 mmHg. Aortic valve area, by VTI measures 1.87 cm. Pulmonic Valve: The pulmonic valve was not well  visualized. Pulmonic valve regurgitation is not visualized. No evidence of pulmonic stenosis. Aorta: The aortic root and ascending aorta are structurally normal, with no evidence of dilitation. IAS/Shunts: The atrial septum is grossly normal.  LEFT VENTRICLE PLAX 2D LVIDd:         3.80 cm LVIDs:         2.90 cm LV PW:  1.10 cm LV IVS:        1.00 cm LVOT diam:     1.80 cm LV SV:         38 LV SV Index:   22 LVOT Area:     2.54 cm  RIGHT VENTRICLE RV S prime:     7.95 cm/s LEFT ATRIUM             Index LA diam:        3.70 cm 2.13 cm/m LA Vol (A2C):   68.9 ml 39.59 ml/m LA Vol (A4C):   72.3 ml 41.54 ml/m LA Biplane Vol: 72.7 ml 41.77 ml/m  AORTIC VALVE AV Area (Vmax):    1.47 cm AV Area (Vmean):   1.30 cm AV Area (VTI):     1.87 cm AV Vmax:           154.00 cm/s AV Vmean:          103.000 cm/s AV VTI:            0.205 m AV Peak Grad:      9.5 mmHg AV Mean Grad:      5.0 mmHg LVOT Vmax:         89.20 cm/s LVOT Vmean:        52.500 cm/s LVOT VTI:          0.151 m LVOT/AV VTI ratio: 0.74  AORTA Ao Root diam: 2.70 cm Ao Asc diam:  3.00 cm MR Peak grad:    107.7 mmHg   TRICUSPID VALVE MR Mean grad:    72.0 mmHg    TR Peak grad:   39.4 mmHg MR Vmax:         519.00 cm/s  TR Vmax:        314.00 cm/s MR Vmean:        408.0 cm/s MR PISA:         0.57 cm     SHUNTS MR PISA Eff ROA: 3 mm        Systemic VTI:  0.15 m MR PISA Radius:  0.30 cm      Systemic Diam: 1.80 cm Rudean Haskell MD Electronically signed by Rudean Haskell MD Signature Date/Time: 04/19/2021/4:04:51 PM    Final

## 2021-04-21 ENCOUNTER — Inpatient Hospital Stay (HOSPITAL_COMMUNITY): Payer: Medicare Other

## 2021-04-21 LAB — COMPREHENSIVE METABOLIC PANEL
ALT: 26 U/L (ref 0–44)
AST: 16 U/L (ref 15–41)
Albumin: 1.6 g/dL — ABNORMAL LOW (ref 3.5–5.0)
Alkaline Phosphatase: 76 U/L (ref 38–126)
Anion gap: 7 (ref 5–15)
BUN: 36 mg/dL — ABNORMAL HIGH (ref 8–23)
CO2: 22 mmol/L (ref 22–32)
Calcium: 8.1 mg/dL — ABNORMAL LOW (ref 8.9–10.3)
Chloride: 106 mmol/L (ref 98–111)
Creatinine, Ser: 1.48 mg/dL — ABNORMAL HIGH (ref 0.44–1.00)
GFR, Estimated: 34 mL/min — ABNORMAL LOW (ref >60)
Glucose, Bld: 190 mg/dL — ABNORMAL HIGH (ref 70–99)
Potassium: 5.1 mmol/L (ref 3.5–5.1)
Sodium: 135 mmol/L (ref 135–145)
Total Bilirubin: 1 mg/dL (ref 0.3–1.2)
Total Protein: 5.2 g/dL — ABNORMAL LOW (ref 6.5–8.1)

## 2021-04-21 LAB — PROTIME-INR
INR: 3.6 — ABNORMAL HIGH (ref 0.8–1.2)
Prothrombin Time: 35.7 seconds — ABNORMAL HIGH (ref 11.4–15.2)

## 2021-04-21 LAB — CBC WITH DIFFERENTIAL/PLATELET
Abs Immature Granulocytes: 0.04 10*3/uL (ref 0.00–0.07)
Basophils Absolute: 0 10*3/uL (ref 0.0–0.1)
Basophils Relative: 0 %
Eosinophils Absolute: 0 10*3/uL (ref 0.0–0.5)
Eosinophils Relative: 0 %
HCT: 26.7 % — ABNORMAL LOW (ref 36.0–46.0)
Hemoglobin: 8.4 g/dL — ABNORMAL LOW (ref 12.0–15.0)
Immature Granulocytes: 0 %
Lymphocytes Relative: 3 %
Lymphs Abs: 0.3 10*3/uL — ABNORMAL LOW (ref 0.7–4.0)
MCH: 29.8 pg (ref 26.0–34.0)
MCHC: 31.5 g/dL (ref 30.0–36.0)
MCV: 94.7 fL (ref 80.0–100.0)
Monocytes Absolute: 0.4 10*3/uL (ref 0.1–1.0)
Monocytes Relative: 4 %
Neutro Abs: 10.8 10*3/uL — ABNORMAL HIGH (ref 1.7–7.7)
Neutrophils Relative %: 93 %
Platelets: 148 10*3/uL — ABNORMAL LOW (ref 150–400)
RBC: 2.82 MIL/uL — ABNORMAL LOW (ref 3.87–5.11)
RDW: 15.4 % (ref 11.5–15.5)
WBC: 11.6 10*3/uL — ABNORMAL HIGH (ref 4.0–10.5)
nRBC: 0 % (ref 0.0–0.2)

## 2021-04-21 LAB — GLUCOSE, CAPILLARY
Glucose-Capillary: 185 mg/dL — ABNORMAL HIGH (ref 70–99)
Glucose-Capillary: 238 mg/dL — ABNORMAL HIGH (ref 70–99)
Glucose-Capillary: 186 mg/dL — ABNORMAL HIGH (ref 70–99)
Glucose-Capillary: 268 mg/dL — ABNORMAL HIGH (ref 70–99)

## 2021-04-21 LAB — MAGNESIUM: Magnesium: 2.3 mg/dL (ref 1.7–2.4)

## 2021-04-21 LAB — BRAIN NATRIURETIC PEPTIDE: B Natriuretic Peptide: 941 pg/mL — ABNORMAL HIGH (ref 0.0–100.0)

## 2021-04-21 LAB — HEMOGLOBIN AND HEMATOCRIT, BLOOD
HCT: 27.5 % — ABNORMAL LOW (ref 36.0–46.0)
Hemoglobin: 9.1 g/dL — ABNORMAL LOW (ref 12.0–15.0)

## 2021-04-21 LAB — PROCALCITONIN: Procalcitonin: 9.8 ng/mL

## 2021-04-21 LAB — C-REACTIVE PROTEIN: CRP: 25.4 mg/dL — ABNORMAL HIGH (ref <1.0)

## 2021-04-21 MED ORDER — DILTIAZEM HCL 60 MG PO TABS
60.0000 mg | ORAL_TABLET | Freq: Two times a day (BID) | ORAL | Status: AC
  Administered 2021-04-21: 21:00:00 60 mg via ORAL
  Filled 2021-04-21: qty 1

## 2021-04-21 MED ORDER — DILTIAZEM HCL ER COATED BEADS 120 MG PO CP24
120.0000 mg | ORAL_CAPSULE | Freq: Every day | ORAL | Status: DC
  Administered 2021-04-22 – 2021-04-24 (×3): 120 mg via ORAL
  Filled 2021-04-21 (×2): qty 1

## 2021-04-21 MED ORDER — CARVEDILOL 12.5 MG PO TABS
12.5000 mg | ORAL_TABLET | Freq: Two times a day (BID) | ORAL | Status: DC
  Administered 2021-04-21 – 2021-04-24 (×6): 12.5 mg via ORAL
  Filled 2021-04-21 (×6): qty 1

## 2021-04-21 MED ORDER — GERHARDT'S BUTT CREAM
TOPICAL_CREAM | Freq: Two times a day (BID) | CUTANEOUS | Status: DC
  Filled 2021-04-21: qty 1

## 2021-04-21 MED ORDER — SODIUM POLYSTYRENE SULFONATE 15 GM/60ML PO SUSP
30.0000 g | Freq: Once | ORAL | Status: AC
  Administered 2021-04-21: 13:00:00 30 g via ORAL
  Filled 2021-04-21: qty 120

## 2021-04-21 NOTE — Progress Notes (Signed)
Patient has been at [facility] for rehab. CSW will continue to follow for discharge needs.   Gilmore Laroche, MSW, [facility]

## 2021-04-21 NOTE — Progress Notes (Signed)
PROGRESS NOTE                                                                                                                                                                                                             Patient Demographics:    Sheila Morgan, is a 87 y.o. female, DOB - 11-18-1932, MGQ:[id number]  Outpatient Primary MD for the patient is Binnie Rail, MD    LOS - 2  Admit date - 04/18/2021    Chief Complaint  Patient presents with   Altered Mental Status       Brief Narrative (HPI from H&P) -  Sheila Morgan is a pleasant 87 y.o. female with medical history significant for coronary artery disease status post CABG, type 2 diabetes mellitus, paroxysmal atrial fibrillation on Xarelto, hypothyroidism, history of DVT, CKD 3A, and recent hospital admission who returns to the emergency department with somnolence and low oxygen saturation at her SNF.   Subjective:   Patient in bed, appears comfortable, denies any headache, no fever, no chest pain or pressure, no shortness of breath , no abdominal pain. No new focal weakness.   Assessment  & Plan :     Acute metabolic encephalopathy due to acute on chronic hypoxic respiratory failure caused by CHF - this is acute on chronic diastolic CHF EF 26%, precipitated by RVR, resolved after Lasix will hold further diuretics as creatinine has bumped, oxygenation and mentation improving, CT head unremarkable, no focal deficits, continue to monitor with diuretics, oxygen supplementation and supportive care.  2.  Chronic A. fib in RVR.  Mali vas 2 score of greater than 3.  Continue beta-blocker, Cardizem dose increased for better rate control, digoxin 2 doses on 04/20/2021, resumed Xarelto for anticoagulation.  3.  COVID-19 infection.  Initially diagnosed on 04/05/2021.  CT chest unremarkable with no infiltrate, CRP was quite elevated hence darted on trial of Decadron with  improvement in inflammatory markers continue to monitor.  4.  Hypothyroidism on Synthroid with stable TSH.  5.  CKD 3A.  Baseline creatinine around 1.3.  Mild AKI, hold further diuretics and monitor.  6.  Dyslipidemia.  On statin.  7.  CAD s/p CABG.  No acute issues.  On beta-blocker and statin for secondary prevention along with Xarelto instead of aspirin.  8. HTN - BP meds adjusted 04/21/21, monitor.  9. DM Type II.  On sliding scale, since placed on Decadron will add low-dose Lantus and monitor.  Lab Results  Component Value Date   HGBA1C 7.2 (H) 04/05/2021    CBG (last 3)  Recent Labs    04/20/21 1617 04/20/21 2200 04/21/21 0849  GLUCAP 238* 256* 185*         Condition - Extremely Guarded  Family Communication  :    Son and Campbell Lerner [phone] - 04/21/21  Code Status :  Full  Consults  :  None  PUD Prophylaxis :     Procedures  :     TTE - 1. Left ventricular ejection fraction, by estimation, is 50%. The left ventricle has mildly decreased function. The left ventricle demonstrates global hypokinesis. There is mild concentric left ventricular hypertrophy. Left ventricular diastolic parameters are indeterminate.  2. Right ventricular systolic function was not well visualized. The right ventricular size is normal. There is mildly elevated pulmonary artery systolic pressure. The estimated right ventricular systolic pressure is 31.5 mmHg.  3. Left atrial size was moderately dilated.  4. The mitral valve is degenerative. Mild mitral valve regurgitation.  5. Tricuspid valve regurgitation is moderate.  6. The aortic valve is tricuspid. There is mild calcification of the aortic valve. There is mild thickening of the aortic valve. Aortic valve regurgitation is not visualized. Aortic valve sclerosis is present, with no evidence of aortic valve stenosis  CT chest- Small bilateral pleural effusions with associated bibasilar atelectasis. No definite superimposed focal pulmonary  infiltrate. Cholelithiasis. Lobulated cystic structure within the splenic hilum, poorly characterized on this examination. Comparison with prior examinations, if available, would be helpful in determining chronicity. If none are available, this would be better assessed with dedicated pancreatic protocol CT or MRI examination, if clinically indicated. Aortic Atherosclerosis   CT Head - non acute      Disposition Plan  :    Status is: Inpatient  Remains inpatient appropriate because: Afib  DVT Prophylaxis  :  Xaralto  Place TED hose Start: 04/21/21 1116 SCDs Start: 04/19/21 0345 Rivaroxaban (XARELTO) tablet 15 mg     Lab Results  Component Value Date   PLT 148 (L) 04/21/2021    Diet :  Diet Order             DIET DYS 2 Room service appropriate? No; Fluid consistency: Thin  Diet effective now                    Inpatient Medications  Scheduled Meds:  carvedilol  25 mg Oral BID WC   colchicine  0.6 mg Oral QODAY   dexamethasone (DECADRON) injection  6 mg Intravenous Q24H   [START ON 04/22/2021] diltiazem  120 mg Oral Daily   diltiazem  60 mg Oral Q12H   Gerhardt's butt cream   Topical BID   insulin aspart  0-5 Units Subcutaneous QHS   insulin aspart  0-9 Units Subcutaneous TID WC   insulin glargine-yfgn  6 Units Subcutaneous Daily   levothyroxine  50 mcg Oral Q0600   polyethylene glycol  17 g Oral Daily   Rivaroxaban  15 mg Oral Q supper   senna-docusate  1 tablet Oral BID   simvastatin  40 mg Oral QPM   sodium chloride flush  3 mL Intravenous Q12H   Continuous Infusions:   PRN Meds:.acetaminophen **OR** acetaminophen, albuterol, diltiazem, metoprolol tartrate  Antibiotics  :    Anti-infectives (From admission, onward)    Start  Dose/Rate Route Frequency Ordered Stop   04/19/21 0000  ceFEPIme (MAXIPIME) 2 g in sodium chloride 0.9 % 100 mL IVPB        2 g 200 mL/hr over 30 Minutes Intravenous  Once 04/18/21 2353 04/19/21 0156        Time Spent in  minutes  30   Lala Lund M.D on 04/21/2021 at 11:17 AM  To page go to [email or web address]   [facility] -  Office  [phone]  See all Orders from today for further details    Objective:   Vitals:   04/20/21 2000 04/21/21 0000 04/21/21 0500 04/21/21 0800  BP: (!) 105/46 (!) 108/50  (!) 104/52  Pulse: 62 80  69  Resp: 20 17  20   Temp: 97.8 F (36.6 C) 98 F (36.7 C)  97.9 F (36.6 C)  TempSrc: Oral Axillary  Axillary  SpO2: 94% 97%  94%  Weight:   70 kg   Height:        Wt Readings from Last 3 Encounters:  04/21/21 70 kg  04/05/21 68.9 kg  01/23/21 71.2 kg     Intake/Output Summary (Last 24 hours) at 04/21/2021 1117 Last data filed at 04/20/2021 2000 Gross per 24 hour  Intake 125.1 ml  Output 400 ml  Net -274.9 ml     Physical Exam  Awake Alert x2, No new F.N deficits, Normal affect [place].AT,PERRAL Supple Neck, No JVD,   Symmetrical Chest wall movement, Good air movement bilaterally, CTAB RRR,No Gallops, Rubs or new Murmurs,  +ve B.Sounds, Abd Soft, No tenderness,   No Cyanosis, Clubbing or edema      RN pressure injury documentation: Pressure Injury 04/19/21 Sacrum Lower;Posterior Unstageable - Full thickness tissue loss in which the base of the injury is covered by slough (yellow, tan, gray, green or brown) and/or eschar (tan, brown or black) in the wound bed. wound consult placed (Active)  04/19/21 1755  Location: Sacrum  Location Orientation: Lower;Posterior  Staging: Unstageable - Full thickness tissue loss in which the base of the injury is covered by slough (yellow, tan, gray, green or brown) and/or eschar (tan, brown or black) in the wound bed.  Wound Description (Comments): wound consult placed  Present on Admission: Yes     Data Review:    CBC Recent Labs  Lab 04/15/21 0456 04/16/21 0452 04/17/21 0441 04/18/21 2243 04/19/21 1139 04/19/21 1256 04/19/21 1806 04/20/21 0105 04/20/21 1700 04/21/21 0752  WBC 19.7* 21.4* 20.3*  19.5*  --   --   --  15.3*  --  11.6*  HGB 12.0 11.3* 12.0 9.6*   < > 9.5* 9.3* 8.2* 8.7* 8.4*  HCT 36.1 35.0* 36.8 30.6*   < > 28.7* 29.8* 26.1* 27.8* 26.7*  PLT 277 249 234 148*  --   --   --  140*  --  148*  MCV 93.8 93.3 93.6 96.2  --   --   --  94.6  --  94.7  MCH 31.2 30.1 30.5 30.2  --   --   --  29.7  --  29.8  MCHC 33.2 32.3 32.6 31.4  --   --   --  31.4  --  31.5  RDW 14.6 15.0 15.2 15.5  --   --   --  15.7*  --  15.4  LYMPHSABS 0.7 1.0 1.0 0.7  --   --   --   --   --  0.3*  MONOABS 1.7* 2.0* 1.6* 2.5*  --   --   --   --   --  0.4  EOSABS 0.0 0.1 0.1 0.0  --   --   --   --   --  0.0  BASOSABS 0.0 0.1 0.0 0.0  --   --   --   --   --  0.0   < > = values in this interval not displayed.    Electrolytes Recent Labs  Lab 04/16/21 0452 04/18/21 2243 04/18/21 2246 04/18/21 2250 04/19/21 0530 04/19/21 1139 04/20/21 0105 04/21/21 0752  NA 142 134*  --   --   --  141 137 135  K 3.7 4.3  --   --   --  3.8 3.9 5.1  CL 110 105  --   --   --   --  108 106  CO2 26 23  --   --   --   --  22 22  GLUCOSE 74 155*  --   --   --   --  129* 190*  BUN 44* 25*  --   --   --   --  28* 36*  CREATININE 1.02* 1.44*  --   --   --   --  1.36* 1.48*  CALCIUM 8.9 8.3*  --   --   --   --  8.1* 8.1*  AST  --  19  --   --   --   --  16 16  ALT  --  37  --   --   --   --  25 26  ALKPHOS  --  59  --   --   --   --  62 76  BILITOT  --  1.2  --   --   --   --  1.3* 1.0  ALBUMIN  --  1.7*  --   --   --   --  <1.5* 1.6*  MG 2.4  --   --   --   --   --   --  2.3  CRP  --   --   --   --  36.1*  --   --  25.4*  DDIMER  --   --   --   --  1.07*  --   --   --   PROCALCITON 0.20  --   --   --  19.87  --  19.50  --   LATICACIDVEN  --   --   --  1.0  --   --   --   --   INR  --   --  4.8*  --   --   --  1.9* 3.6*  TSH  --   --   --   --  0.495  --   --   --   AMMONIA  --   --   --   --  14  --   --   --   BNP  --   --   --   --   --   --  982.9* 941.0*     ------------------------------------------------------------------------------------------------------------------ No results for input(s): CHOL, HDL, LDLCALC, TRIG, CHOLHDL, LDLDIRECT in the last 72 hours.  Lab Results  Component Value Date   HGBA1C 7.2 (H) 04/05/2021    Recent Labs    04/19/21 0530  TSH 0.495       Radiology Reports CT HEAD WO CONTRAST (5MM)  Result Date: 04/19/2021 CLINICAL DATA:  87 year old female with history of mental status change. EXAM: CT HEAD WITHOUT  CONTRAST TECHNIQUE: Contiguous axial images were obtained from the base of the skull through the vertex without intravenous contrast. COMPARISON:  Head CT 04/05/2021. FINDINGS: Brain: Mild cerebral atrophy. Patchy and confluent areas of decreased attenuation are noted throughout the deep and periventricular white matter of the cerebral hemispheres bilaterally, compatible with chronic microvascular ischemic disease. No evidence of acute infarction, hemorrhage, hydrocephalus, extra-axial collection or mass lesion/mass effect. Vascular: No hyperdense vessel or unexpected calcification. Skull: Normal. Negative for fracture or focal lesion. Sinuses/Orbits: No acute finding. Other: None. IMPRESSION: 1. No acute intracranial abnormalities. 2. Mild cerebral atrophy with extensive chronic microvascular ischemic changes in the cerebral white matter redemonstrated, as above. Electronically Signed   By: Vinnie Langton M.D.   On: 04/19/2021 05:10   CT Chest Wo Contrast  Result Date: 04/19/2021 CLINICAL DATA:  Pneumonia, complication suspected, xray done. Hypoxia. EXAM: CT CHEST WITHOUT CONTRAST TECHNIQUE: Multidetector CT imaging of the chest was performed following the standard protocol without IV contrast. COMPARISON:  None. FINDINGS: Cardiovascular: Extensive multi-vessel coronary artery calcification. Coronary artery bypass grafting has been performed. Global cardiac size is within normal limits. No pericardial  effusion. Central pulmonary arteries are of normal caliber. Moderate atherosclerotic calcification within the thoracic aorta. No aortic aneurysm. Mediastinum/Nodes: No enlarged mediastinal or axillary lymph nodes. Thyroid gland, trachea, and esophagus demonstrate no significant findings. Lungs/Pleura: Small bilateral pleural effusions are present with associated bibasilar atelectasis, right greater than left. No superimposed focal pulmonary nodules or infiltrates. No pneumothorax. Central airways are widely patent. Upper Abdomen: A lobulated cystic structure is seen within the a splenic hilum intimately associated with the tail the pancreas and the gastric fundus. This is indeterminate and may represent a cystic neoplasm of the pancreas, a pancreatic cyst CIS, gastric diverticulum, or potentially sequela of remote trauma. This is not well characterized on this noncontrast examination. Cholelithiasis noted. Musculoskeletal: Osseous structures are age-appropriate. No acute bone abnormality. IMPRESSION: Small bilateral pleural effusions with associated bibasilar atelectasis. No definite superimposed focal pulmonary infiltrate. Cholelithiasis. Lobulated cystic structure within the splenic hilum, poorly characterized on this examination. Comparison with prior examinations, if available, would be helpful in determining chronicity. If none are available, this would be better assessed with dedicated pancreatic protocol CT or MRI examination, if clinically indicated. Aortic Atherosclerosis (ICD10-I70.0). Electronically Signed   By: Fidela Salisbury M.D.   On: 04/19/2021 01:00   DG Chest Port 1 View  Result Date: 04/21/2021 CLINICAL DATA:  Shortness of breath. EXAM: PORTABLE CHEST 1 VIEW COMPARISON:  Portable chest yesterday at 8:51 a.m. FINDINGS: Slightly increased small to moderate-sized pleural effusions right greater than left with continued opacities of the lower lung fields consistent with atelectasis or  consolidation. The left mid and both upper lung fields remain clear. There is cardiomegaly, CABG changes and mild central vascular distension increasing as well as slight central edema. The aorta is tortuous and ectatic with calcifications. Osteopenia. IMPRESSION: 1. Increased perihilar vascular congestion and slight central edema. 2. Slightly increased small or moderate sized pleural effusions right greater than left, with persistent overlying opacities. 3. Aortic atherosclerosis. Electronically Signed   By: Telford Nab M.D.   On: 04/21/2021 05:45   DG Chest Port 1 View  Result Date: 04/20/2021 CLINICAL DATA:  Shortness of breath EXAM: PORTABLE CHEST 1 VIEW COMPARISON:  CT chest performed on the same date. FINDINGS: The heart is enlarged. Evidence of prior coronary artery bypass grafting. Small bilateral pleural effusions with bibasilar atelectasis. IMPRESSION: 1.  Cardiomegaly. 2.  Small bilateral pleural effusions  with bibasilar atelectasis. Electronically Signed   By: Keane Police D.O.   On: 04/20/2021 09:08   DG Chest Port 1 View  Result Date: 04/18/2021 CLINICAL DATA:  Hypoxia and decreased mental alertness. Possible sepsis. EXAM: PORTABLE CHEST 1 VIEW COMPARISON:  Portable chest 04/11/2021. FINDINGS: The heart is enlarged. The aorta is tortuous, ectatic and calcified. Old CABG change. Central vessels are normal in caliber. There are small layering pleural effusions with overlying haziness in the lower lung fields consistent with atelectasis or pneumonia. The mid and upper lungs clear with COPD change. Overall aeration is not significantly changed. No new abnormality. Osteopenia. IMPRESSION: Small pleural effusions with bibasilar atelectasis or consolidation. Cardiomegaly. No new abnormality or worsening. Electronically Signed   By: Telford Nab M.D.   On: 04/18/2021 23:39   ECHOCARDIOGRAM COMPLETE  Result Date: 04/19/2021    ECHOCARDIOGRAM REPORT   Patient Name:   MAYARI MATUS Date of  Exam: 04/19/2021 Medical Rec #:  [id number]       Height:       64.0 in Accession #:    [id number]      Weight:       151.9 lb Date of Birth:  Dec 25, 1932       BSA:          1.740 m Patient Age:    42 years        BP:           145/93 mmHg Patient Gender: F               HR:           93 bpm. Exam Location:  Inpatient Procedure: 2D Echo, Cardiac Doppler and Color Doppler Indications:    Atrial fibrillation  History:        Patient has prior history of Echocardiogram examinations, most                 recent 12/27/2020. Previous Myocardial Infarction and CAD,                 Arrythmias:Atrial Fibrillation; Risk Factors:Hypertension and                 Dyslipidemia.  Sonographer:    Merrie Roof RDCS Referring Phys: [id number] [place]  1. Left ventricular ejection fraction, by estimation, is 50%. The left ventricle has mildly decreased function. The left ventricle demonstrates global hypokinesis. There is mild concentric left ventricular hypertrophy. Left ventricular diastolic parameters are indeterminate.  2. Right ventricular systolic function was not well visualized. The right ventricular size is normal. There is mildly elevated pulmonary artery systolic pressure. The estimated right ventricular systolic pressure is 32.4 mmHg.  3. Left atrial size was moderately dilated.  4. The mitral valve is degenerative. Mild mitral valve regurgitation.  5. Tricuspid valve regurgitation is moderate.  6. The aortic valve is tricuspid. There is mild calcification of the aortic valve. There is mild thickening of the aortic valve. Aortic valve regurgitation is not visualized. Aortic valve sclerosis is present, with no evidence of aortic valve stenosis. Comparison(s): A prior study was performed on 12/27/20. In AF RVR, LV appears decrease and mitral and tricuspid valve disease appear worse. FINDINGS  Left Ventricle: Left ventricular ejection fraction, by estimation, is 50%. The left ventricle has mildly decreased function. The  left ventricle demonstrates global hypokinesis. The left ventricular internal cavity size was normal in size. There is mild concentric left ventricular hypertrophy. Left ventricular diastolic parameters are indeterminate.  Right Ventricle: The right ventricular size is normal. No increase in right ventricular wall thickness. Right ventricular systolic function was not well visualized. There is mildly elevated pulmonary artery systolic pressure. The tricuspid regurgitant velocity is 3.14 m/s, and with an assumed right atrial pressure of 3 mmHg, the estimated right ventricular systolic pressure is 16.1 mmHg. Left Atrium: Left atrial size was moderately dilated. Right Atrium: Right atrial size was normal in size. Pericardium: There is no evidence of pericardial effusion. Mitral Valve: The mitral valve is degenerative in appearance. Mild to moderate mitral annular calcification. Mild mitral valve regurgitation. Tricuspid Valve: The tricuspid valve is normal in structure. Tricuspid valve regurgitation is moderate . No evidence of tricuspid stenosis. Aortic Valve: The aortic valve is tricuspid. There is mild calcification of the aortic valve. There is mild thickening of the aortic valve. There is mild aortic valve annular calcification. Aortic valve regurgitation is not visualized. Aortic valve sclerosis is present, with no evidence of aortic valve stenosis. Aortic valve mean gradient measures 5.0 mmHg. Aortic valve peak gradient measures 9.5 mmHg. Aortic valve area, by VTI measures 1.87 cm. Pulmonic Valve: The pulmonic valve was not well visualized. Pulmonic valve regurgitation is not visualized. No evidence of pulmonic stenosis. Aorta: The aortic root and ascending aorta are structurally normal, with no evidence of dilitation. IAS/Shunts: The atrial septum is grossly normal.  LEFT VENTRICLE PLAX 2D LVIDd:         3.80 cm LVIDs:         2.90 cm LV PW:         1.10 cm LV IVS:        1.00 cm LVOT diam:     1.80 cm LV SV:          38 LV SV Index:   22 LVOT Area:     2.54 cm  RIGHT VENTRICLE RV S prime:     7.95 cm/s LEFT ATRIUM             Index LA diam:        3.70 cm 2.13 cm/m LA Vol (A2C):   68.9 ml 39.59 ml/m LA Vol (A4C):   72.3 ml 41.54 ml/m LA Biplane Vol: 72.7 ml 41.77 ml/m  AORTIC VALVE AV Area (Vmax):    1.47 cm AV Area (Vmean):   1.30 cm AV Area (VTI):     1.87 cm AV Vmax:           154.00 cm/s AV Vmean:          103.000 cm/s AV VTI:            0.205 m AV Peak Grad:      9.5 mmHg AV Mean Grad:      5.0 mmHg LVOT Vmax:         89.20 cm/s LVOT Vmean:        52.500 cm/s LVOT VTI:          0.151 m LVOT/AV VTI ratio: 0.74  AORTA Ao Root diam: 2.70 cm Ao Asc diam:  3.00 cm MR Peak grad:    107.7 mmHg   TRICUSPID VALVE MR Mean grad:    72.0 mmHg    TR Peak grad:   39.4 mmHg MR Vmax:         519.00 cm/s  TR Vmax:        314.00 cm/s MR Vmean:        408.0 cm/s MR PISA:         0.57  cm     SHUNTS MR PISA Eff ROA: 3 mm        Systemic VTI:  0.15 m MR PISA Radius:  0.30 cm      Systemic Diam: 1.80 cm Rudean Haskell MD Electronically signed by Rudean Haskell MD Signature Date/Time: 04/19/2021/4:04:51 PM    Final

## 2021-04-21 NOTE — Evaluation (Signed)
Occupational Therapy Evaluation Patient Details Name: Sheila Morgan MRN: [id number] DOB: 1932/09/02 Today's Date: 04/21/2021   History of Present Illness 87 y.o. female who returns to the emergency department 04/19/21 with somnolence and low oxygen saturation at her SNF. Acute encephalopathy; respiratiory failure; SIRS;  PMH significant for coronary artery disease status post CABG, type 2 diabetes mellitus, paroxysmal atrial fibrillation on Xarelto, hypothyroidism, history of DVT, CKD 3A, and recent hospital admission (12/3-12/15)   Clinical Impression   Sheila Morgan was at [facility] since her recent d/c from [facility], she was active with therapy working on transfers. Upon evaluation pt is limited by generalized weakness and poor activity tolerance. She required min A for bed mobility to complete ADLs at bed level, mod A for sup>sit and had fair sitting EOB balance. Pt attempted 2x for sit<>stand with max A +2, and was unable to obtain full upright standing. Pt will benefit from OT acutely. Recommend d/c back to SNF.      Recommendations for follow up therapy are one component of a multi-disciplinary discharge planning process, led by the attending physician.  Recommendations may be updated based on patient status, additional functional criteria and insurance authorization.   Follow Up Recommendations  Skilled nursing-short term rehab (<3 hours/day)       Functional Status Assessment  Patient has had a recent decline in their functional status and demonstrates the ability to make significant improvements in function in a reasonable and predictable amount of time.  Equipment Recommendations  Other (comment)       Precautions / Restrictions Precautions Precautions: Fall Restrictions Weight Bearing Restrictions: No      Mobility Bed Mobility Overal bed mobility: Needs Assistance Bed Mobility: Rolling;Sidelying to Sit;Sit to Sidelying Rolling: Min assist Sidelying to sit: Mod assist;+2  for physical assistance     Sit to sidelying: Max assist;+2 for physical assistance General bed mobility comments: rolling left and right with rails and incr time, cues; pt assists with moving legs over EOB and pushing up onto Rt elbow; return to supine assist with torso and legs    Transfers Overall transfer level: Needs assistance Equipment used: None Transfers: Sit to/from Stand Sit to Stand: Max assist;From elevated surface;+2 physical assistance          Lateral/Scoot Transfers: Max assist;+2 physical assistance General transfer comment: first attempt to stand without pad under hips with inability to clear hips off mattress; lateral scoot with use of pad under hips and able to clear hips off bed to scoot      Balance Overall balance assessment: Needs assistance Sitting-balance support: No upper extremity supported;Feet supported Sitting balance-Leahy Scale: Fair     Standing balance support: Bilateral upper extremity supported;Reliant on assistive device for balance Standing balance-Leahy Scale: Zero Standing balance comment: unable to fully stand due to weakness                           ADL either performed or assessed with clinical judgement   ADL Overall ADL's : Needs assistance/impaired Eating/Feeding: Set up;Bed level   Grooming: Set up;Sitting   Upper Body Bathing: Moderate assistance;Sitting   Lower Body Bathing: Maximal assistance;Bed level   Upper Body Dressing : Minimal assistance;Sitting   Lower Body Dressing: Moderate assistance;Bed level   Toilet Transfer: Total assistance   Toileting- Clothing Manipulation and Hygiene: Total assistance;Bed level       Functional mobility during ADLs: Moderate assistance (at bed level) General ADL Comments: limited  to bed level ADLs with min A for rolling and mod A for trasnfer to EOB. benefits from verbal cues     Vision Baseline Vision/History: 1 Wears glasses Ability to See in Adequate Light:  0 Adequate Patient Visual Report: No change from baseline Vision Assessment?: No apparent visual deficits            Pertinent Vitals/Pain Pain Assessment: Faces Faces Pain Scale: No hurt Breathing: normal Negative Vocalization: none Facial Expression: smiling or inexpressive Body Language: relaxed Consolability: no need to console PAINAD Score: 0     Hand Dominance Left   Extremity/Trunk Assessment Upper Extremity Assessment Upper Extremity Assessment: Defer to OT evaluation RUE Deficits / Details: Shoulder ROM limited to ~50* AROM. Elbow-->Grip: WFL with hand OA looking deformities. Pt reports she fell "some time ago" and injured her shoulder. RUE Sensation: WNL RUE Coordination: decreased fine motor   Lower Extremity Assessment Lower Extremity Assessment: Defer to PT evaluation RLE Deficits / Details: AROM: hip WFL, knee to 100 flexion, ankle in 10 PF; strength-2+ overall LLE Deficits / Details: AROM: hip WFL, knee to 110 flexion, ankle in 10 PF; strength- 3/5 overall   Cervical / Trunk Assessment Cervical / Trunk Assessment: Kyphotic   Communication Communication Communication: HOH   Cognition Arousal/Alertness: Awake/alert Behavior During Therapy: WFL for tasks assessed/performed Overall Cognitive Status: History of cognitive impairments - at baseline         Problem Solving: Requires verbal cues;Requires tactile cues;Slow processing       General Comments  pt had BM, returned to bed for hygiene. Poor skin integrity around sacrum. Left pt positioned on her side            Home Living Family/patient expects to be discharged to:: Skilled nursing facility Living Arrangements: Alone Available Help at Discharge: Family;Available PRN/intermittently;Friend(s) Type of Home: House Home Access: Level entry     Home Layout: Bed/bath upstairs               Home Equipment: Conservation officer, nature (2 wheels);Cane - single point;Shower seat;Hand held Cytogeneticist Comments: has stair lift      Prior Functioning/Environment Prior Level of Function : Needs assist       Physical Assist : Mobility (physical) Mobility (physical): Bed mobility;Transfers   Mobility Comments: recent discharge to SNF; reports she has been working on bed to chair transfers          OT Problem List: Decreased strength;Cardiopulmonary status limiting activity;Decreased cognition;Decreased activity tolerance;Decreased safety awareness;Impaired balance (sitting and/or standing);Decreased knowledge of use of DME or AE;Decreased knowledge of precautions;Impaired UE functional use      OT Treatment/Interventions: Self-care/ADL training;Therapeutic exercise;Therapeutic activities;Cognitive remediation/compensation;Energy conservation;Patient/family education;DME and/or AE instruction;Balance training    OT Goals(Current goals can be found in the care plan section) Acute Rehab OT Goals OT Goal Formulation: With patient Time For Goal Achievement: 04/22/21 Potential to Achieve Goals: Good ADL Goals Pt Will Perform Grooming: with min guard assist;standing Pt Will Perform Upper Body Bathing: with set-up;sitting Pt Will Perform Lower Body Dressing: with min assist;sit to/from stand Pt Will Transfer to Toilet: with min assist;stand pivot transfer  OT Frequency: Min 2X/week   Barriers to D/C: Decreased caregiver support          Co-evaluation PT/OT/SLP Co-Evaluation/Treatment: Yes Reason for Co-Treatment: Complexity of the patient's impairments (multi-system involvement);For patient/therapist safety;To address functional/ADL transfers PT goals addressed during session: Mobility/safety with mobility;Balance OT goals addressed during session: ADL's and self-care  AM-PAC OT "6 Clicks" Daily Activity     Outcome Measure Help from another person eating meals?: None Help from another person taking care of personal grooming?: A  Little Help from another person toileting, which includes using toliet, bedpan, or urinal?: Total Help from another person bathing (including washing, rinsing, drying)?: A Lot Help from another person to put on and taking off regular upper body clothing?: A Little Help from another person to put on and taking off regular lower body clothing?: A Lot 6 Click Score: 15   End of Session Nurse Communication: Mobility status (skin integrity)  Activity Tolerance: Patient tolerated treatment well Patient left: in bed;with call bell/phone within reach;with bed alarm set  OT Visit Diagnosis: Unsteadiness on feet (R26.81);Other symptoms and signs involving cognitive function;History of falling (Z91.81);Muscle weakness (generalized) (M62.81)                Time: 1962-2297 OT Time Calculation (min): 21 min Charges:  OT General Charges $OT Visit: 1 Visit OT Evaluation $OT Eval Moderate Complexity: 1 Mod   [place]ford Sprong A Brayant Dorr 04/21/2021, 3:35 PM

## 2021-04-21 NOTE — Consult Note (Addendum)
[place] Nurse Consult Note: Patient receiving care in [facility] [phone] Reason for Consult: sacral wound Wound type: MASD/IAD to the buttocks and intergluteal fold with a small open wound that is pink and moist.  Pressure Injury POA: Yes Measurement: deferred Wound bed: Pink Drainage (amount, consistency, odor) serosanguinous on the foam dressing.  Dressing procedure/placement/frequency: Clean the buttocks with no rinse cleanser. Pat dry and apply a generous coat of Gerhardt's butt cream twice daily or PRN soiling. Mattress replacement ordered.   ICD-10 CM Codes for Irritant Dermatitis L24A2 - Due to fecal, urinary or dual incontinence  Monitor the wound area(s) for worsening of condition such as: Signs/symptoms of infection, increase in size, development of or worsening of odor, development of pain, or increased pain at the affected locations.   Notify the medical team if any of these develop.  Thank you for the consult. [place] nurse will not follow at this time.   Please re-consult the [place] team if needed.  Cathlean Marseilles Tamala Julian, MSN, RN, [place], Lysle Pearl, [facility] Wound Treatment Associate Pager [phone]

## 2021-04-21 NOTE — Evaluation (Signed)
Physical Therapy Evaluation Patient Details Name: Sheila Morgan MRN: [id number] DOB: Mar 17, 1933 Today's Date: 04/21/2021  History of Present Illness  87 y.o. female who returns to the emergency department 04/19/21 with somnolence and low oxygen saturation at her SNF. Acute encephalopathy; respiratiory failure; SIRS;  PMH significant for coronary artery disease status post CABG, type 2 diabetes mellitus, paroxysmal atrial fibrillation on Xarelto, hypothyroidism, history of DVT, CKD 3A, and recent hospital admission (12/3-12/15)  Clinical Impression   Pt admitted secondary to problem above with deficits below. PTA patient was recently discharged from hospital to SNF for rehab and was working on transfers (per pt). Pt currently requires +2 max for attempt to stand (not able to achieve upright).  Anticipate patient will benefit from PT to address problems listed below.Will continue to follow acutely to maximize functional mobility independence and safety.          Recommendations for follow up therapy are one component of a multi-disciplinary discharge planning process, led by the attending physician.  Recommendations may be updated based on patient status, additional functional criteria and insurance authorization.  Follow Up Recommendations Skilled nursing-short term rehab (<3 hours/day)    Assistance Recommended at Discharge Frequent or constant Supervision/Assistance  Functional Status Assessment Patient has had a recent decline in their functional status and demonstrates the ability to make significant improvements in function in a reasonable and predictable amount of time.  Equipment Recommendations  None recommended by PT    Recommendations for Other Services       Precautions / Restrictions Precautions Precautions: Fall Restrictions Weight Bearing Restrictions: No      Mobility  Bed Mobility Overal bed mobility: Needs Assistance Bed Mobility: Rolling;Sidelying to Sit;Sit to  Sidelying Rolling: Min assist Sidelying to sit: Mod assist;+2 for physical assistance     Sit to sidelying: Max assist;+2 for physical assistance General bed mobility comments: rolling left and right with rails and incr time, cues; pt assists with moving legs over EOB and pushing up onto Rt elbow; return to supine assist with torso and legs    Transfers Overall transfer level: Needs assistance Equipment used: None Transfers: Sit to/from Stand Sit to Stand: Max assist;From elevated surface;+2 physical assistance          Lateral/Scoot Transfers: Max assist;+2 physical assistance General transfer comment: first attempt to stand without pad under hips with inability to clear hips off mattress; lateral scoot with use of pad under hips and able to clear hips off bed to scoot    Ambulation/Gait                  Stairs            Wheelchair Mobility    Modified Rankin (Stroke Patients Only)       Balance Overall balance assessment: Needs assistance Sitting-balance support: No upper extremity supported;Feet supported Sitting balance-Leahy Scale: Fair     Standing balance support: Bilateral upper extremity supported;Reliant on assistive device for balance Standing balance-Leahy Scale: Zero Standing balance comment: unable to fully stand due to weakness                             Pertinent Vitals/Pain Pain Assessment: Faces Faces Pain Scale: No hurt Breathing: normal Negative Vocalization: none Facial Expression: smiling or inexpressive Body Language: relaxed Consolability: no need to console PAINAD Score: 0    Home Living Family/patient expects to be discharged to:: Skilled nursing facility Living Arrangements: Alone  Available Help at Discharge: Family;Available PRN/intermittently;Friend(s) Type of Home: House Home Access: Level entry       Home Layout: Bed/bath upstairs Home Equipment: Conservation officer, nature (2 wheels);Cane - single  point;Shower seat;Hand held shower head;Transport chair Additional Comments: has stair lift    Prior Function Prior Level of Function : Needs assist       Physical Assist : Mobility (physical) Mobility (physical): Bed mobility;Transfers   Mobility Comments: recent discharge to SNF; reports she has been working on bed to chair transfers       Hand Dominance   Dominant Hand: Left    Extremity/Trunk Assessment   Upper Extremity Assessment Upper Extremity Assessment: Defer to OT evaluation    Lower Extremity Assessment Lower Extremity Assessment: RLE deficits/detail;LLE deficits/detail RLE Deficits / Details: AROM: hip WFL, knee to 100 flexion, ankle in 10 PF; strength-2+ overall LLE Deficits / Details: AROM: hip WFL, knee to 110 flexion, ankle in 10 PF; strength- 3/5 overall    Cervical / Trunk Assessment Cervical / Trunk Assessment: Kyphotic  Communication   Communication: HOH  Cognition Arousal/Alertness: Awake/alert Behavior During Therapy: WFL for tasks assessed/performed Overall Cognitive Status: History of cognitive impairments - at baseline                               Problem Solving: Requires verbal cues;Requires tactile cues;Slow processing          General Comments General comments (skin integrity, edema, etc.): pt with bowel movement on return to bed; assisted with rolling for pericare; noted bottom very raw and pt positioned on her side at end of sessionw with RN made aware    Exercises     Assessment/Plan    PT Assessment Patient needs continued PT services  PT Problem List Decreased strength;Decreased mobility;Decreased activity tolerance;Decreased balance;Decreased knowledge of use of DME;Decreased range of motion;Decreased cognition       PT Treatment Interventions DME instruction;Gait training;Therapeutic activities;Therapeutic exercise;Patient/family education;Balance training;Functional mobility training;Cognitive remediation     PT Goals (Current goals can be found in the Care Plan section)  Acute Rehab PT Goals Patient Stated Goal: to get better PT Goal Formulation: With patient Time For Goal Achievement: 05/05/21 Potential to Achieve Goals: Good    Frequency Min 2X/week   Barriers to discharge Decreased caregiver support      Co-evaluation PT/OT/SLP Co-Evaluation/Treatment: Yes Reason for Co-Treatment: Complexity of the patient's impairments (multi-system involvement);For patient/therapist safety PT goals addressed during session: Mobility/safety with mobility;Balance         AM-PAC PT "6 Clicks" Mobility  Outcome Measure Help needed turning from your back to your side while in a flat bed without using bedrails?: A Little Help needed moving from lying on your back to sitting on the side of a flat bed without using bedrails?: Total Help needed moving to and from a bed to a chair (including a wheelchair)?: Total Help needed standing up from a chair using your arms (e.g., wheelchair or bedside chair)?: Total Help needed to walk in hospital room?: Total Help needed climbing 3-5 steps with a railing? : Total 6 Click Score: 8    End of Session   Activity Tolerance: Patient tolerated treatment well Patient left: with call bell/phone within reach;in bed;with bed alarm set Nurse Communication: Mobility status;Other (comment) (needs purewick, needs sacral pad) PT Visit Diagnosis: Muscle weakness (generalized) (M62.81);Difficulty in walking, not elsewhere classified (R26.2)    Time: 1497-0263 PT Time Calculation (min) (ACUTE  ONLY): 30 min   Charges:   PT Evaluation $PT Eval Moderate Complexity: [place], PT Acute Rehabilitation Services  Pager [phone] Office [phone]   Rexanne Mano 04/21/2021, 12:22 PM

## 2021-04-21 NOTE — NC FL2 (Signed)
[place] MEDICAID FL2 LEVEL OF CARE SCREENING TOOL     IDENTIFICATION  Patient Name: Sheila Morgan Birthdate: 19-Jun-1932 Sex: female Admission Date (Current Location): 04/18/2021  [facility] and [place] Number:  Herbalist and Address:  [facility], [place] [address], [place], [place] [address]      Provider Number: [id number]  Attending Physician Name and Address:  Thurnell Lose, MD  Relative Name and Phone Number:       Current Level of Care: Hospital Recommended Level of Care: [place] Prior Approval Number:    Date Approved/Denied:   PASRR Number: [id number]  Discharge Plan: SNF    Current Diagnoses: Patient Active Problem List   Diagnosis Date Noted   Acute respiratory failure with hypoxia ([place]) 04/19/2021   Acute encephalopathy 04/19/2021   Acute renal failure superimposed on stage 3a chronic kidney disease ([place]) 04/19/2021   Coagulopathy ([place]) 04/19/2021   SIRS (systemic inflammatory response syndrome) ([place]) 04/19/2021   COVID 04/05/2021   Abrasion of right hand 01/13/2021   Paroxysmal atrial fibrillation ([place]) 12/31/2020   Foot swelling 09/10/2020   Calcium pyrophosphate arthropathy of multiple sites 05/14/2020   Small vessel disease ([place]) 02/12/2020   Left leg DVT ([place]), 12/15/19 12/15/2019   Urinary frequency 02/22/2019   Fall 07/14/2018   Bilateral hearing loss 01/05/2018   Memory difficulties 01/05/2018   Multiple closed fractures of ribs of right side 01/19/2017   Acute pain of right shoulder 01/05/2017   Acute pain of right knee 01/05/2017   B12 deficiency 06/13/2015   Hypothyroidism 06/13/2015   DM (diabetes mellitus) type II, controlled, with peripheral vascular disorder ([place]) 03/06/2014   Gout 10/15/2013   Arthritis    Hiatal hernia    Coronary artery disease    Left knee DJD    Benign paroxysmal positional vertigo 05/19/2013   Weakness generalized 05/09/2013   Pseudogout of left knee  03/29/2013   Acute right lumbar radiculopathy 03/18/2013   Shoulder impingement syndrome 01/25/2013   Vertigo 10/17/2012   Renal insufficiency, mild 06/20/2011   Essential hypertension 12/31/2009   Osteoporosis - Dr Kathlene November 12/31/2009   SKIN CANCER, HX OF 12/31/2009   Proteinuria 01/05/2008   Normocytic anemia 12/21/2007   Hyperlipidemia 09/08/2006   Polymyalgia rheumatica ([place]) 09/08/2006   CORONARY ARTERY BYPASS GRAFT, HX OF 09/08/2006    Orientation RESPIRATION BLADDER Height & Weight     Self, Situation  O2 (Nasal cannula 2L) Incontinent, External catheter Weight: 154 lb 5.2 oz (70 kg) Height:  5\' 6"  (167.6 cm)  BEHAVIORAL SYMPTOMS/MOOD NEUROLOGICAL BOWEL NUTRITION STATUS      Incontinent Diet (see dc summary)  AMBULATORY STATUS COMMUNICATION OF NEEDS Skin   Extensive Assist Verbally PU Stage and Appropriate Care (Unstageable on sacrum w/foam dressing)                       Personal Care Assistance Level of Assistance  Bathing, Feeding, Dressing Bathing Assistance: Maximum assistance Feeding assistance: Limited assistance Dressing Assistance: Limited assistance     Functional Limitations Info    Sight Info: Impaired Hearing Info: Impaired Speech Info: Adequate ([place])    SPECIAL CARE FACTORS FREQUENCY  PT (By licensed PT), OT (By licensed OT)     PT Frequency: 5x/week OT Frequency: 5x/week            Contractures Contractures Info: Not present    Additional Factors Info  Code Status, Allergies, Insulin Sliding Scale Code Status Info:  Full Allergies Info: NKA   Insulin Sliding Scale Info: See dc summary       Current Medications (04/21/2021):  This is the current hospital active medication list Current Facility-Administered Medications  Medication Dose Route Frequency Provider Last Rate Last Admin   acetaminophen (TYLENOL) tablet 650 mg  650 mg Oral Q6H PRN Opyd, Ilene Qua, MD       Or   acetaminophen (TYLENOL) suppository 650 mg  650 mg  Rectal Q6H PRN Opyd, Ilene Qua, MD       albuterol (VENTOLIN HFA) 108 (90 Base) MCG/ACT inhaler 2 puff  2 puff Inhalation Q6H PRN Opyd, Ilene Qua, MD       carvedilol (COREG) tablet 25 mg  25 mg Oral BID WC Opyd, Ilene Qua, MD   25 mg at 04/21/21 0900   colchicine tablet 0.6 mg  0.6 mg Oral QODAY Opyd, Ilene Qua, MD   0.6 mg at 04/21/21 0900   dexamethasone (DECADRON) injection 6 mg  6 mg Intravenous Q24H Thurnell Lose, MD   6 mg at 04/20/21 1215   diltiazem (CARDIZEM) injection 10 mg  10 mg Intravenous Q6H PRN Thurnell Lose, MD       diltiazem (CARDIZEM) tablet 90 mg  90 mg Oral Q6H Thurnell Lose, MD   90 mg at 04/21/21 0610   furosemide (LASIX) injection 40 mg  40 mg Intravenous Daily Florencia Reasons, MD   40 mg at 04/21/21 0900   Gerhardt's butt cream   Topical BID Thurnell Lose, MD       insulin aspart (novoLOG) injection 0-5 Units  0-5 Units Subcutaneous QHS Opyd, Ilene Qua, MD   3 Units at 04/20/21 2300   insulin aspart (novoLOG) injection 0-9 Units  0-9 Units Subcutaneous TID WC Opyd, Ilene Qua, MD   2 Units at 04/21/21 0859   insulin glargine-yfgn (SEMGLEE) injection 6 Units  6 Units Subcutaneous Daily Thurnell Lose, MD   6 Units at 04/21/21 0859   levothyroxine (SYNTHROID) tablet 50 mcg  50 mcg Oral Q0600 Vianne Bulls, MD   50 mcg at 04/21/21 0610   metoprolol tartrate (LOPRESSOR) injection 2.5 mg  2.5 mg Intravenous Q8H PRN Florencia Reasons, MD       polyethylene glycol (MIRALAX / GLYCOLAX) packet 17 g  17 g Oral Daily Florencia Reasons, MD   17 g at 04/21/21 0900   Rivaroxaban (XARELTO) tablet 15 mg  15 mg Oral Q supper Thurnell Lose, MD   15 mg at 04/20/21 1215   senna-docusate (Senokot-S) tablet 1 tablet  1 tablet Oral BID Florencia Reasons, MD   1 tablet at 04/21/21 0900   simvastatin (ZOCOR) tablet 40 mg  40 mg Oral QPM Opyd, Ilene Qua, MD   40 mg at 04/20/21 1728   sodium chloride flush (NS) 0.9 % injection 3 mL  3 mL Intravenous Q12H Opyd, Ilene Qua, MD   3 mL at 04/21/21 0901      Discharge Medications: Please see discharge summary for a list of discharge medications.  Relevant Imaging Results:  Relevant Lab Results:   Additional Information SSN: [id number]. Off COVID precautions [employer] COVID-19 Vaccine 12/22/2019 , 06/25/2019)  Benard Halsted, LCSW

## 2021-04-22 LAB — CBC WITH DIFFERENTIAL/PLATELET
Abs Immature Granulocytes: 0.03 10*3/uL (ref 0.00–0.07)
Basophils Absolute: 0 10*3/uL (ref 0.0–0.1)
Basophils Relative: 0 %
Eosinophils Absolute: 0 10*3/uL (ref 0.0–0.5)
Eosinophils Relative: 0 %
HCT: 27.3 % — ABNORMAL LOW (ref 36.0–46.0)
Hemoglobin: 8.9 g/dL — ABNORMAL LOW (ref 12.0–15.0)
Immature Granulocytes: 0 %
Lymphocytes Relative: 3 %
Lymphs Abs: 0.3 10*3/uL — ABNORMAL LOW (ref 0.7–4.0)
MCH: 30.2 pg (ref 26.0–34.0)
MCHC: 32.6 g/dL (ref 30.0–36.0)
MCV: 92.5 fL (ref 80.0–100.0)
Monocytes Absolute: 0.3 10*3/uL (ref 0.1–1.0)
Monocytes Relative: 3 %
Neutro Abs: 8.2 10*3/uL — ABNORMAL HIGH (ref 1.7–7.7)
Neutrophils Relative %: 94 %
Platelets: 174 10*3/uL (ref 150–400)
RBC: 2.95 MIL/uL — ABNORMAL LOW (ref 3.87–5.11)
RDW: 15.5 % (ref 11.5–15.5)
WBC: 8.8 10*3/uL (ref 4.0–10.5)
nRBC: 0 % (ref 0.0–0.2)

## 2021-04-22 LAB — C-REACTIVE PROTEIN: CRP: 12.9 mg/dL — ABNORMAL HIGH (ref <1.0)

## 2021-04-22 LAB — COMPREHENSIVE METABOLIC PANEL
ALT: 46 U/L — ABNORMAL HIGH (ref 0–44)
AST: 32 U/L (ref 15–41)
Albumin: 1.7 g/dL — ABNORMAL LOW (ref 3.5–5.0)
Alkaline Phosphatase: 88 U/L (ref 38–126)
Anion gap: 10 (ref 5–15)
BUN: 45 mg/dL — ABNORMAL HIGH (ref 8–23)
CO2: 20 mmol/L — ABNORMAL LOW (ref 22–32)
Calcium: 8.3 mg/dL — ABNORMAL LOW (ref 8.9–10.3)
Chloride: 106 mmol/L (ref 98–111)
Creatinine, Ser: 1.63 mg/dL — ABNORMAL HIGH (ref 0.44–1.00)
GFR, Estimated: 31 mL/min — ABNORMAL LOW (ref >60)
Glucose, Bld: 212 mg/dL — ABNORMAL HIGH (ref 70–99)
Potassium: 4.5 mmol/L (ref 3.5–5.1)
Sodium: 136 mmol/L (ref 135–145)
Total Bilirubin: 1 mg/dL (ref 0.3–1.2)
Total Protein: 5.6 g/dL — ABNORMAL LOW (ref 6.5–8.1)

## 2021-04-22 LAB — OSMOLALITY, URINE: Osmolality, Ur: 420 mOsm/kg (ref 300–900)

## 2021-04-22 LAB — HEMOGLOBIN AND HEMATOCRIT, BLOOD
HCT: 28.3 % — ABNORMAL LOW (ref 36.0–46.0)
Hemoglobin: 9.2 g/dL — ABNORMAL LOW (ref 12.0–15.0)

## 2021-04-22 LAB — BRAIN NATRIURETIC PEPTIDE: B Natriuretic Peptide: 897.4 pg/mL — ABNORMAL HIGH (ref 0.0–100.0)

## 2021-04-22 LAB — SODIUM, URINE, RANDOM: Sodium, Ur: <10 mmol/L

## 2021-04-22 LAB — GLUCOSE, CAPILLARY
Glucose-Capillary: 208 mg/dL — ABNORMAL HIGH (ref 70–99)
Glucose-Capillary: 275 mg/dL — ABNORMAL HIGH (ref 70–99)
Glucose-Capillary: 220 mg/dL — ABNORMAL HIGH (ref 70–99)
Glucose-Capillary: 263 mg/dL — ABNORMAL HIGH (ref 70–99)

## 2021-04-22 LAB — URIC ACID: Uric Acid, Serum: 7.9 mg/dL — ABNORMAL HIGH (ref 2.5–7.1)

## 2021-04-22 LAB — MAGNESIUM: Magnesium: 2.4 mg/dL (ref 1.7–2.4)

## 2021-04-22 LAB — CREATININE, URINE, RANDOM: Creatinine, Urine: 84.2 mg/dL

## 2021-04-22 LAB — PROCALCITONIN: Procalcitonin: 6.9 ng/mL

## 2021-04-22 LAB — OSMOLALITY: Osmolality: 307 mOsm/kg — ABNORMAL HIGH (ref 275–295)

## 2021-04-22 MED ORDER — LACTATED RINGERS IV SOLN: INTRAVENOUS | Status: AC

## 2021-04-22 NOTE — Progress Notes (Signed)
Speech Language Pathology Treatment: Dysphagia  Patient Details Name: Sheila Morgan MRN: [id number] DOB: 09-Nov-1932 Today's Date: 04/22/2021 Time: 9937-1696 SLP Time Calculation (min) (ACUTE ONLY): 14 min  Assessment / Plan / Recommendation Clinical Impression  RN reports pt consuming dys 2, thin liquid meal without difficulty this am. Pt alert and upright in bed for consumption of thin liquids, puree and regular textured solids. Of note, baseline cough present prior to POs. She self-fed consecutive straw sips of thin liquids demonstrating intermittent overt coughing/throat clearing. Minimal verbal cues and manual removal of straw to assist with small sips eliminated s/sx. Overt coughing also observed x1 post bite of pureed solid. Question relation of coughing to PO intake. As PO trials continued, fatigue increased and consumption of regular texture was prolonged. Given clinical presentation this date, recommend continue dys 2, thin liquid diet with SLP to f/u for tolerance of thin liquids and advanced solid trials as appropriate.    HPI HPI: Patient is an 87 y.o. female with PMH: coronary artery disease status post CABG, type 2 diabetes mellitus, paroxysmal atrial fibrillation on Xarelto, hypothyroidism, history of DVT, CKD 3A, and recent hospital admission who returns to the emergency department with somnolence and low oxygen saturation at her SNF.  Patient was admitted to the hospital on 04/05/2021 and discharged on 12/15. Upon arrival to ED, patient was afebrile, oxygen saturations 100% on 2L supplemental oxygen, she was tachypneic as high as 31 with stable BP. CT chest revealed Small bilateral pleural effusions with associated bibasilar  atelectasis. No definite superimposed focal pulmonary infiltrate. She continues to be on COVID isolation precautions.      SLP Plan  Continue with current plan of care      Recommendations for follow up therapy are one component of a multi-disciplinary  discharge planning process, led by the attending physician.  Recommendations may be updated based on patient status, additional functional criteria and insurance authorization.    Recommendations  Diet recommendations: Dysphagia 2 (fine chop);Thin liquid Liquids provided via: Cup;Straw Medication Administration: Whole meds with puree Supervision: Patient able to self feed Compensations: Minimize environmental distractions;Slow rate;Small sips/bites Postural Changes and/or Swallow Maneuvers: Seated upright 90 degrees                Oral Care Recommendations: Oral care BID;Staff/trained caregiver to provide oral care Follow Up Recommendations: No SLP follow up Assistance recommended at discharge: None SLP Visit Diagnosis: Dysphagia, unspecified (R13.10) Plan: Continue with current plan of care         [name], [place], [place] Office Number: [place]  04/22/2021, 12:03 PM

## 2021-04-22 NOTE — Consult Note (Signed)
° °  [facility] CM Inpatient Consult   04/22/2021  TAYLI BUCH 15-Jan-1933 [id number]  [facility] [ACO] Patient: Medicare CMS DCE  Primary Care Provider:  Binnie Rail, MD, [facility], Embedded, patient active with pharmacist for Chronic Care Management   Patient screened for less than 7 days hospitalization with noted high risk score for unplanned readmission risk. Review of patient's medical record reveals patient is to return to SNF when medically stable.    Plan:  Continue to follow progress and disposition to assess for post hospital care management needs. If the patient goes to a [facility] affiliated facility then, patient can be followed by [facility] PAC RN with traditional Medicare.    Plan:   [facility] PAC RN can follow for any known or needs for transitional care needs for returning to post facility care or complex disease management.  For questions or referrals, please contact:   Natividad Brood, RN BSN [facility] Liaison  [phone] business mobile phone Toll free office [phone]  Fax number: [phone] [email or web address]

## 2021-04-22 NOTE — TOC Initial Note (Addendum)
Transition of Care [facility]) - Initial/Assessment Note    Patient Details  Name: Sheila Morgan MRN: [id number] Date of Birth: 10-12-1932  [facility]) CM/SW Contact:    Benard Halsted, LCSW Phone Number: 04/22/2021, 1:00 PM  Clinical Narrative:                 1pm-[facility] able to accept patient back tomorrow if medically stable. CSW left voicemail for patient's daughter.   1:15pm-Patient's daughter returned call and is in agreement with plan.   Expected Discharge Plan: Skilled Nursing Facility Barriers to Discharge: Continued Medical Work up   Patient Goals and CMS Choice Patient states their goals for this hospitalization and ongoing recovery are:: Rehab CMS Medicare.gov Compare Post Acute Care list provided to:: Patient Represenative (must comment) Choice offered to / list presented to : Adult Children  Expected Discharge Plan and Services Expected Discharge Plan: [place] In-house Referral: Clinical Social Work   Post Acute Care Choice: [place] Living arrangements for the past 2 months: [place]                                      Prior Living Arrangements/Services Living arrangements for the past 2 months: Single Family Home Lives with:: Adult Children Patient language and need for interpreter reviewed:: Yes Do you feel safe going back to the place where you live?: Yes      Need for Family Participation in Patient Care: Yes (Comment) Care giver support system in place?: Yes (comment)   Criminal Activity/Legal Involvement Pertinent to Current Situation/Hospitalization: No - Comment as needed  Activities of Daily Living Home Assistive Devices/Equipment: Bedside commode/3-in-1, Dentures (specify type) ADL Screening (condition at time of admission) Patient's cognitive ability adequate to safely complete daily activities?: No Is the patient deaf or have difficulty hearing?: Yes Does the patient have  difficulty seeing, even when wearing glasses/contacts?: Yes Does the patient have difficulty concentrating, remembering, or making decisions?: Yes Patient able to express need for assistance with ADLs?: No Does the patient have difficulty dressing or bathing?: Yes Independently performs ADLs?: No Communication: Independent Dressing (OT): Needs assistance Is this a change from baseline?: Pre-admission baseline Grooming: Needs assistance Is this a change from baseline?: Pre-admission baseline Feeding: Needs assistance Is this a change from baseline?: Pre-admission baseline Bathing: Needs assistance Is this a change from baseline?: Pre-admission baseline Toileting: Needs assistance Is this a change from baseline?: Pre-admission baseline In/Out Bed: Needs assistance Is this a change from baseline?: Pre-admission baseline Walks in Home: Needs assistance Is this a change from baseline?: Pre-admission baseline Does the patient have difficulty walking or climbing stairs?: No Weakness of Legs: Both Weakness of Arms/Hands: Both  Permission Sought/Granted Permission sought to share information with : Facility Sport and exercise psychologist, Family Supports Permission granted to share information with : Yes, Verbal Permission Granted  Share Information with NAME: [name]  Permission granted to share info w AGENCY: SNF  Permission granted to share info w Relationship: Daughter  Permission granted to share info w Contact Information: [phone]  Emotional Assessment Appearance:: Appears stated age Attitude/Demeanor/Rapport: Unable to Assess Affect (typically observed): Unable to Assess Orientation: : Oriented to Self, Oriented to Place, Oriented to  Time Alcohol / Substance Use: Not Applicable Psych Involvement: No (comment)  Admission diagnosis:  Hypoxia [R09.02] Acute respiratory failure with hypoxia (HCC) [J96.01] Altered mental status, unspecified altered mental status type [R41.82]  Patient  Active Problem List   Diagnosis Date Noted   Acute respiratory failure with hypoxia ([place]) 04/19/2021   Acute encephalopathy 04/19/2021   Acute renal failure superimposed on stage 3a chronic kidney disease ([place]) 04/19/2021   Coagulopathy ([place]) 04/19/2021   SIRS (systemic inflammatory response syndrome) ([place]) 04/19/2021   COVID 04/05/2021   Abrasion of right hand 01/13/2021   Paroxysmal atrial fibrillation ([place]) 12/31/2020   Foot swelling 09/10/2020   Calcium pyrophosphate arthropathy of multiple sites 05/14/2020   Small vessel disease ([place]) 02/12/2020   Left leg DVT ([place]), 12/15/19 12/15/2019   Urinary frequency 02/22/2019   Fall 07/14/2018   Bilateral hearing loss 01/05/2018   Memory difficulties 01/05/2018   Multiple closed fractures of ribs of right side 01/19/2017   Acute pain of right shoulder 01/05/2017   Acute pain of right knee 01/05/2017   B12 deficiency 06/13/2015   Hypothyroidism 06/13/2015   DM (diabetes mellitus) type II, controlled, with peripheral vascular disorder ([place]) 03/06/2014   Gout 10/15/2013   Arthritis    Hiatal hernia    Coronary artery disease    Left knee DJD    Benign paroxysmal positional vertigo 05/19/2013   Weakness generalized 05/09/2013   Pseudogout of left knee 03/29/2013   Acute right lumbar radiculopathy 03/18/2013   Shoulder impingement syndrome 01/25/2013   Vertigo 10/17/2012   Renal insufficiency, mild 06/20/2011   Essential hypertension 12/31/2009   Osteoporosis - Dr Kathlene November 12/31/2009   SKIN CANCER, HX OF 12/31/2009   Proteinuria 01/05/2008   Normocytic anemia 12/21/2007   Hyperlipidemia 09/08/2006   Polymyalgia rheumatica ([place]) 09/08/2006   CORONARY ARTERY BYPASS GRAFT, HX OF 09/08/2006   PCP:  Binnie Rail, MD Pharmacy:   [facility] [place], [place] [place] - [address] [place] [address] Phone: [phone] Fax: [phone]     Social  Determinants of Health (SDOH) Interventions    Readmission Risk Interventions No flowsheet data found.

## 2021-04-22 NOTE — Progress Notes (Signed)
PROGRESS NOTE                                                                                                                                                                                                             Patient Demographics:    Sheila Morgan, is a 87 y.o. female, DOB - [birth year], EKC:[id number]  Outpatient Primary MD for the patient is Binnie Rail, MD    LOS - 3  Admit date - 04/18/2021    Chief Complaint  Patient presents with   Altered Mental Status       Brief Narrative (HPI from H&P) -  Sheila Morgan is a pleasant 87 y.o. female with medical history significant for coronary artery disease status post CABG, type 2 diabetes mellitus, paroxysmal atrial fibrillation on Xarelto, hypothyroidism, history of DVT, CKD 3A, and recent hospital admission who returns to the emergency department with somnolence and low oxygen saturation at her SNF.   Subjective:   Patient in bed, appears comfortable, denies any headache, no fever, no chest pain or pressure, no shortness of breath , no abdominal pain. No new focal weakness.   Assessment  & Plan :     Acute metabolic encephalopathy due to acute on chronic hypoxic respiratory failure caused by CHF - this is acute on chronic diastolic CHF EF 50%, precipitated by RVR, resolved after Lasix will hold further diuretics as creatinine has bumped, oxygenation and mentation improving, CT head unremarkable, no focal deficits, continue to monitor with diuretics, oxygen supplementation and supportive care.  2.  Chronic A. fib in RVR.  Mali vas 2 score of greater than 3.  Continue beta-blocker, Cardizem dose increased for better rate control, digoxin 2 doses on 04/20/2021, resumed Xarelto for anticoagulation.  3.  COVID-19 infection.  Initially diagnosed on 04/05/2021.  CT chest unremarkable with no infiltrate, CRP was quite elevated hence darted on trial of Decadron with  improvement in inflammatory markers continue to monitor.  4.  Hypothyroidism on Synthroid with stable TSH.  5.  AKI On CKD 3A.  Baseline creatinine around 1.3.  Hold diuretics and monitor.  6.  Dyslipidemia.  On statin.  7.  CAD s/p CABG.  No acute issues.  On beta-blocker and statin for secondary prevention along with Xarelto instead of aspirin.  8. HTN - BP meds adjusted 04/21/21, monitor.  9. DM Type II.  On sliding scale, since placed on Decadron will add low-dose Lantus and monitor.  Lab Results  Component Value Date   HGBA1C 7.2 (H) 04/05/2021    CBG (last 3)  Recent Labs    04/21/21 1706 04/21/21 2045 04/22/21 0754  GLUCAP 186* 268* 208*         Condition - Extremely Guarded  Family Communication  :    Son and Campbell Lerner [phone] - 04/21/21  Code Status :  Full  Consults  :  None  PUD Prophylaxis :     Procedures  :     TTE - 1. Left ventricular ejection fraction, by estimation, is 50%. The left ventricle has mildly decreased function. The left ventricle demonstrates global hypokinesis. There is mild concentric left ventricular hypertrophy. Left ventricular diastolic parameters are indeterminate.  2. Right ventricular systolic function was not well visualized. The right ventricular size is normal. There is mildly elevated pulmonary artery systolic pressure. The estimated right ventricular systolic pressure is 50.2 mmHg.  3. Left atrial size was moderately dilated.  4. The mitral valve is degenerative. Mild mitral valve regurgitation.  5. Tricuspid valve regurgitation is moderate.  6. The aortic valve is tricuspid. There is mild calcification of the aortic valve. There is mild thickening of the aortic valve. Aortic valve regurgitation is not visualized. Aortic valve sclerosis is present, with no evidence of aortic valve stenosis  CT chest- Small bilateral pleural effusions with associated bibasilar atelectasis. No definite superimposed focal pulmonary infiltrate.  Cholelithiasis. Lobulated cystic structure within the splenic hilum, poorly characterized on this examination. Comparison with prior examinations, if available, would be helpful in determining chronicity. If none are available, this would be better assessed with dedicated pancreatic protocol CT or MRI examination, if clinically indicated. Aortic Atherosclerosis   CT Head - non acute      Disposition Plan  :    Status is: Inpatient  Remains inpatient appropriate because: Afib  DVT Prophylaxis  :  Xaralto  Place TED hose Start: 04/21/21 1116 SCDs Start: 04/19/21 0345 Rivaroxaban (XARELTO) tablet 15 mg     Lab Results  Component Value Date   PLT 174 04/22/2021    Diet :  Diet Order             DIET DYS 2 Room service appropriate? No; Fluid consistency: Thin  Diet effective now                    Inpatient Medications  Scheduled Meds:  carvedilol  12.5 mg Oral BID WC   colchicine  0.6 mg Oral QODAY   dexamethasone (DECADRON) injection  6 mg Intravenous Q24H   diltiazem  120 mg Oral Daily   Gerhardt's butt cream   Topical BID   insulin aspart  0-5 Units Subcutaneous QHS   insulin aspart  0-9 Units Subcutaneous TID WC   insulin glargine-yfgn  6 Units Subcutaneous Daily   levothyroxine  50 mcg Oral Q0600   polyethylene glycol  17 g Oral Daily   Rivaroxaban  15 mg Oral Q supper   senna-docusate  1 tablet Oral BID   simvastatin  40 mg Oral QPM   sodium chloride flush  3 mL Intravenous Q12H   Continuous Infusions:   PRN Meds:.acetaminophen **OR** acetaminophen, albuterol, diltiazem, metoprolol tartrate  Antibiotics  :    Anti-infectives (From admission, onward)    Start     Dose/Rate Route Frequency Ordered Stop   04/19/21 0000  ceFEPIme (  MAXIPIME) 2 g in sodium chloride 0.9 % 100 mL IVPB        2 g 200 mL/hr over 30 Minutes Intravenous  Once 04/18/21 2353 04/19/21 0156        Time Spent in minutes  30   Lala Lund M.D on 04/22/2021 at 11:48  AM  To page go to [email or web address]   [facility] -  Office  [phone]  See all Orders from today for further details    Objective:   Vitals:   04/21/21 2000 04/22/21 0000 04/22/21 0302 04/22/21 0755  BP: 103/63 107/68 122/62 124/82  Pulse: 75 74 94 95  Resp: 20 20 18 16   Temp: 97.8 F (36.6 C) 98 F (36.7 C) 98.5 F (36.9 C) 98.3 F (36.8 C)  TempSrc: Oral Axillary Axillary Oral  SpO2: 97% 95% 97%   Weight:      Height:        Wt Readings from Last 3 Encounters:  04/21/21 70 kg  04/05/21 68.9 kg  01/23/21 71.2 kg     Intake/Output Summary (Last 24 hours) at 04/22/2021 1148 Last data filed at 04/22/2021 0900 Gross per 24 hour  Intake 30 ml  Output 900 ml  Net -870 ml     Physical Exam  Awake Alert, No new F.N deficits, Normal affect [place].AT,PERRAL Supple Neck, No JVD,   Symmetrical Chest wall movement, Good air movement bilaterally, CTAB RRR,No Gallops, Rubs or new Murmurs,  +ve B.Sounds, Abd Soft, No tenderness,   No Cyanosis, Clubbing or edema    RN pressure injury documentation: Pressure Injury 04/19/21 Sacrum Lower;Posterior Unstageable - Full thickness tissue loss in which the base of the injury is covered by slough (yellow, tan, gray, green or brown) and/or eschar (tan, brown or black) in the wound bed. wound consult placed (Active)  04/19/21 1755  Location: Sacrum  Location Orientation: Lower;Posterior  Staging: Unstageable - Full thickness tissue loss in which the base of the injury is covered by slough (yellow, tan, gray, green or brown) and/or eschar (tan, brown or black) in the wound bed.  Wound Description (Comments): wound consult placed  Present on Admission: Yes     Data Review:    CBC Recent Labs  Lab 04/16/21 0452 04/17/21 0441 04/18/21 2243 04/19/21 1139 04/20/21 0105 04/20/21 1700 04/21/21 0752 04/21/21 1629 04/22/21 0530  WBC 21.4* 20.3* 19.5*  --  15.3*  --  11.6*  --  8.8  HGB 11.3* 12.0 9.6*   < > 8.2* 8.7*  8.4* 9.1* 8.9*  HCT 35.0* 36.8 30.6*   < > 26.1* 27.8* 26.7* 27.5* 27.3*  PLT 249 234 148*  --  140*  --  148*  --  174  MCV 93.3 93.6 96.2  --  94.6  --  94.7  --  92.5  MCH 30.1 30.5 30.2  --  29.7  --  29.8  --  30.2  MCHC 32.3 32.6 31.4  --  31.4  --  31.5  --  32.6  RDW 15.0 15.2 15.5  --  15.7*  --  15.4  --  15.5  LYMPHSABS 1.0 1.0 0.7  --   --   --  0.3*  --  0.3*  MONOABS 2.0* 1.6* 2.5*  --   --   --  0.4  --  0.3  EOSABS 0.1 0.1 0.0  --   --   --  0.0  --  0.0  BASOSABS 0.1 0.0 0.0  --   --   --  0.0  --  0.0   < > = values in this interval not displayed.    Electrolytes Recent Labs  Lab 04/16/21 0452 04/18/21 2243 04/18/21 2246 04/18/21 2250 04/19/21 0530 04/19/21 1139 04/20/21 0105 04/21/21 0752 04/22/21 0530  NA 142 134*  --   --   --  141 137 135 136  K 3.7 4.3  --   --   --  3.8 3.9 5.1 4.5  CL 110 105  --   --   --   --  108 106 106  CO2 26 23  --   --   --   --  22 22 20*  GLUCOSE 74 155*  --   --   --   --  129* 190* 212*  BUN 44* 25*  --   --   --   --  28* 36* 45*  CREATININE 1.02* 1.44*  --   --   --   --  1.36* 1.48* 1.63*  CALCIUM 8.9 8.3*  --   --   --   --  8.1* 8.1* 8.3*  AST  --  19  --   --   --   --  16 16 32  ALT  --  37  --   --   --   --  25 26 46*  ALKPHOS  --  59  --   --   --   --  62 76 88  BILITOT  --  1.2  --   --   --   --  1.3* 1.0 1.0  ALBUMIN  --  1.7*  --   --   --   --  <1.5* 1.6* 1.7*  MG 2.4  --   --   --   --   --   --  2.3 2.4  CRP  --   --   --   --  36.1*  --   --  25.4* 12.9*  DDIMER  --   --   --   --  1.07*  --   --   --   --   PROCALCITON 0.20  --   --   --  19.87  --  19.50 9.80 6.90  LATICACIDVEN  --   --   --  1.0  --   --   --   --   --   INR  --   --  4.8*  --   --   --  1.9* 3.6*  --   TSH  --   --   --   --  0.495  --   --   --   --   AMMONIA  --   --   --   --  14  --   --   --   --   BNP  --   --   --   --   --   --  982.9* 941.0* 897.4*     ------------------------------------------------------------------------------------------------------------------ No results for input(s): CHOL, HDL, LDLCALC, TRIG, CHOLHDL, LDLDIRECT in the last 72 hours.  Lab Results  Component Value Date   HGBA1C 7.2 (H) 04/05/2021    No results for input(s): TSH, T4TOTAL, T3FREE, THYROIDAB in the last 72 hours.  Invalid input(s): [place]      Radiology Reports CT HEAD WO CONTRAST (5MM)  Result Date: 04/19/2021 CLINICAL DATA:  87 year old female with history of mental status change. EXAM: CT HEAD WITHOUT CONTRAST TECHNIQUE:  Contiguous axial images were obtained from the base of the skull through the vertex without intravenous contrast. COMPARISON:  Head CT 04/05/2021. FINDINGS: Brain: Mild cerebral atrophy. Patchy and confluent areas of decreased attenuation are noted throughout the deep and periventricular white matter of the cerebral hemispheres bilaterally, compatible with chronic microvascular ischemic disease. No evidence of acute infarction, hemorrhage, hydrocephalus, extra-axial collection or mass lesion/mass effect. Vascular: No hyperdense vessel or unexpected calcification. Skull: Normal. Negative for fracture or focal lesion. Sinuses/Orbits: No acute finding. Other: None. IMPRESSION: 1. No acute intracranial abnormalities. 2. Mild cerebral atrophy with extensive chronic microvascular ischemic changes in the cerebral white matter redemonstrated, as above. Electronically Signed   By: Vinnie Langton M.D.   On: 04/19/2021 05:10   CT Chest Wo Contrast  Result Date: 04/19/2021 CLINICAL DATA:  Pneumonia, complication suspected, xray done. Hypoxia. EXAM: CT CHEST WITHOUT CONTRAST TECHNIQUE: Multidetector CT imaging of the chest was performed following the standard protocol without IV contrast. COMPARISON:  None. FINDINGS: Cardiovascular: Extensive multi-vessel coronary artery calcification. Coronary artery bypass grafting has been performed.  Global cardiac size is within normal limits. No pericardial effusion. Central pulmonary arteries are of normal caliber. Moderate atherosclerotic calcification within the thoracic aorta. No aortic aneurysm. Mediastinum/Nodes: No enlarged mediastinal or axillary lymph nodes. Thyroid gland, trachea, and esophagus demonstrate no significant findings. Lungs/Pleura: Small bilateral pleural effusions are present with associated bibasilar atelectasis, right greater than left. No superimposed focal pulmonary nodules or infiltrates. No pneumothorax. Central airways are widely patent. Upper Abdomen: A lobulated cystic structure is seen within the a splenic hilum intimately associated with the tail the pancreas and the gastric fundus. This is indeterminate and may represent a cystic neoplasm of the pancreas, a pancreatic cyst CIS, gastric diverticulum, or potentially sequela of remote trauma. This is not well characterized on this noncontrast examination. Cholelithiasis noted. Musculoskeletal: Osseous structures are age-appropriate. No acute bone abnormality. IMPRESSION: Small bilateral pleural effusions with associated bibasilar atelectasis. No definite superimposed focal pulmonary infiltrate. Cholelithiasis. Lobulated cystic structure within the splenic hilum, poorly characterized on this examination. Comparison with prior examinations, if available, would be helpful in determining chronicity. If none are available, this would be better assessed with dedicated pancreatic protocol CT or MRI examination, if clinically indicated. Aortic Atherosclerosis (ICD10-I70.0). Electronically Signed   By: Fidela Salisbury M.D.   On: 04/19/2021 01:00   DG Chest Port 1 View  Result Date: 04/21/2021 CLINICAL DATA:  Shortness of breath. EXAM: PORTABLE CHEST 1 VIEW COMPARISON:  Portable chest yesterday at 8:51 a.m. FINDINGS: Slightly increased small to moderate-sized pleural effusions right greater than left with continued opacities of the  lower lung fields consistent with atelectasis or consolidation. The left mid and both upper lung fields remain clear. There is cardiomegaly, CABG changes and mild central vascular distension increasing as well as slight central edema. The aorta is tortuous and ectatic with calcifications. Osteopenia. IMPRESSION: 1. Increased perihilar vascular congestion and slight central edema. 2. Slightly increased small or moderate sized pleural effusions right greater than left, with persistent overlying opacities. 3. Aortic atherosclerosis. Electronically Signed   By: Telford Nab M.D.   On: 04/21/2021 05:45   DG Chest Port 1 View  Result Date: 04/20/2021 CLINICAL DATA:  Shortness of breath EXAM: PORTABLE CHEST 1 VIEW COMPARISON:  CT chest performed on the same date. FINDINGS: The heart is enlarged. Evidence of prior coronary artery bypass grafting. Small bilateral pleural effusions with bibasilar atelectasis. IMPRESSION: 1.  Cardiomegaly. 2.  Small bilateral pleural effusions with bibasilar  atelectasis. Electronically Signed   By: Keane Police D.O.   On: 04/20/2021 09:08   DG Chest Port 1 View  Result Date: 04/18/2021 CLINICAL DATA:  Hypoxia and decreased mental alertness. Possible sepsis. EXAM: PORTABLE CHEST 1 VIEW COMPARISON:  Portable chest 04/11/2021. FINDINGS: The heart is enlarged. The aorta is tortuous, ectatic and calcified. Old CABG change. Central vessels are normal in caliber. There are small layering pleural effusions with overlying haziness in the lower lung fields consistent with atelectasis or pneumonia. The mid and upper lungs clear with COPD change. Overall aeration is not significantly changed. No new abnormality. Osteopenia. IMPRESSION: Small pleural effusions with bibasilar atelectasis or consolidation. Cardiomegaly. No new abnormality or worsening. Electronically Signed   By: Telford Nab M.D.   On: 04/18/2021 23:39   ECHOCARDIOGRAM COMPLETE  Result Date: 04/19/2021    ECHOCARDIOGRAM  REPORT   Patient Name:   Sheila Morgan Date of Exam: 04/19/2021 Medical Rec #:  [id number]       Height:       64.0 in Accession #:    [id number]      Weight:       151.9 lb Date of Birth:  [birth year]       BSA:          1.740 m Patient Age:    27 years        BP:           145/93 mmHg Patient Gender: F               HR:           93 bpm. Exam Location:  Inpatient Procedure: 2D Echo, Cardiac Doppler and Color Doppler Indications:    Atrial fibrillation  History:        Patient has prior history of Echocardiogram examinations, most                 recent 12/27/2020. Previous Myocardial Infarction and CAD,                 Arrythmias:Atrial Fibrillation; Risk Factors:Hypertension and                 Dyslipidemia.  Sonographer:    Merrie Roof RDCS Referring Phys: [id number] [place]  1. Left ventricular ejection fraction, by estimation, is 50%. The left ventricle has mildly decreased function. The left ventricle demonstrates global hypokinesis. There is mild concentric left ventricular hypertrophy. Left ventricular diastolic parameters are indeterminate.  2. Right ventricular systolic function was not well visualized. The right ventricular size is normal. There is mildly elevated pulmonary artery systolic pressure. The estimated right ventricular systolic pressure is 69.6 mmHg.  3. Left atrial size was moderately dilated.  4. The mitral valve is degenerative. Mild mitral valve regurgitation.  5. Tricuspid valve regurgitation is moderate.  6. The aortic valve is tricuspid. There is mild calcification of the aortic valve. There is mild thickening of the aortic valve. Aortic valve regurgitation is not visualized. Aortic valve sclerosis is present, with no evidence of aortic valve stenosis. Comparison(s): A prior study was performed on 12/27/20. In AF RVR, LV appears decrease and mitral and tricuspid valve disease appear worse. FINDINGS  Left Ventricle: Left ventricular ejection fraction, by estimation, is 50%. The  left ventricle has mildly decreased function. The left ventricle demonstrates global hypokinesis. The left ventricular internal cavity size was normal in size. There is mild concentric left ventricular hypertrophy. Left ventricular diastolic parameters are indeterminate. Right Ventricle:  The right ventricular size is normal. No increase in right ventricular wall thickness. Right ventricular systolic function was not well visualized. There is mildly elevated pulmonary artery systolic pressure. The tricuspid regurgitant velocity is 3.14 m/s, and with an assumed right atrial pressure of 3 mmHg, the estimated right ventricular systolic pressure is 79.8 mmHg. Left Atrium: Left atrial size was moderately dilated. Right Atrium: Right atrial size was normal in size. Pericardium: There is no evidence of pericardial effusion. Mitral Valve: The mitral valve is degenerative in appearance. Mild to moderate mitral annular calcification. Mild mitral valve regurgitation. Tricuspid Valve: The tricuspid valve is normal in structure. Tricuspid valve regurgitation is moderate . No evidence of tricuspid stenosis. Aortic Valve: The aortic valve is tricuspid. There is mild calcification of the aortic valve. There is mild thickening of the aortic valve. There is mild aortic valve annular calcification. Aortic valve regurgitation is not visualized. Aortic valve sclerosis is present, with no evidence of aortic valve stenosis. Aortic valve mean gradient measures 5.0 mmHg. Aortic valve peak gradient measures 9.5 mmHg. Aortic valve area, by VTI measures 1.87 cm. Pulmonic Valve: The pulmonic valve was not well visualized. Pulmonic valve regurgitation is not visualized. No evidence of pulmonic stenosis. Aorta: The aortic root and ascending aorta are structurally normal, with no evidence of dilitation. IAS/Shunts: The atrial septum is grossly normal.  LEFT VENTRICLE PLAX 2D LVIDd:         3.80 cm LVIDs:         2.90 cm LV PW:         1.10 cm LV  IVS:        1.00 cm LVOT diam:     1.80 cm LV SV:         38 LV SV Index:   22 LVOT Area:     2.54 cm  RIGHT VENTRICLE RV S prime:     7.95 cm/s LEFT ATRIUM             Index LA diam:        3.70 cm 2.13 cm/m LA Vol (A2C):   68.9 ml 39.59 ml/m LA Vol (A4C):   72.3 ml 41.54 ml/m LA Biplane Vol: 72.7 ml 41.77 ml/m  AORTIC VALVE AV Area (Vmax):    1.47 cm AV Area (Vmean):   1.30 cm AV Area (VTI):     1.87 cm AV Vmax:           154.00 cm/s AV Vmean:          103.000 cm/s AV VTI:            0.205 m AV Peak Grad:      9.5 mmHg AV Mean Grad:      5.0 mmHg LVOT Vmax:         89.20 cm/s LVOT Vmean:        52.500 cm/s LVOT VTI:          0.151 m LVOT/AV VTI ratio: 0.74  AORTA Ao Root diam: 2.70 cm Ao Asc diam:  3.00 cm MR Peak grad:    107.7 mmHg   TRICUSPID VALVE MR Mean grad:    72.0 mmHg    TR Peak grad:   39.4 mmHg MR Vmax:         519.00 cm/s  TR Vmax:        314.00 cm/s MR Vmean:        408.0 cm/s MR PISA:         0.57 cm  SHUNTS MR PISA Eff ROA: 3 mm        Systemic VTI:  0.15 m MR PISA Radius:  0.30 cm      Systemic Diam: 1.80 cm Rudean Haskell MD Electronically signed by Rudean Haskell MD Signature Date/Time: 04/19/2021/4:04:51 PM    Final

## 2021-04-23 LAB — CBC WITH DIFFERENTIAL/PLATELET
Abs Immature Granulocytes: 0.03 10*3/uL (ref 0.00–0.07)
Basophils Absolute: 0 10*3/uL (ref 0.0–0.1)
Basophils Relative: 0 %
Eosinophils Absolute: 0 10*3/uL (ref 0.0–0.5)
Eosinophils Relative: 0 %
HCT: 27.6 % — ABNORMAL LOW (ref 36.0–46.0)
Hemoglobin: 8.7 g/dL — ABNORMAL LOW (ref 12.0–15.0)
Immature Granulocytes: 0 %
Lymphocytes Relative: 4 %
Lymphs Abs: 0.3 10*3/uL — ABNORMAL LOW (ref 0.7–4.0)
MCH: 29.2 pg (ref 26.0–34.0)
MCHC: 31.5 g/dL (ref 30.0–36.0)
MCV: 92.6 fL (ref 80.0–100.0)
Monocytes Absolute: 0.2 10*3/uL (ref 0.1–1.0)
Monocytes Relative: 2 %
Neutro Abs: 7 10*3/uL (ref 1.7–7.7)
Neutrophils Relative %: 94 %
Platelets: 170 10*3/uL (ref 150–400)
RBC: 2.98 MIL/uL — ABNORMAL LOW (ref 3.87–5.11)
RDW: 15.2 % (ref 11.5–15.5)
WBC: 7.5 10*3/uL (ref 4.0–10.5)
nRBC: 0 % (ref 0.0–0.2)

## 2021-04-23 LAB — COMPREHENSIVE METABOLIC PANEL
ALT: 42 U/L (ref 0–44)
AST: 20 U/L (ref 15–41)
Albumin: 1.7 g/dL — ABNORMAL LOW (ref 3.5–5.0)
Alkaline Phosphatase: 79 U/L (ref 38–126)
Anion gap: 7 (ref 5–15)
BUN: 53 mg/dL — ABNORMAL HIGH (ref 8–23)
CO2: 21 mmol/L — ABNORMAL LOW (ref 22–32)
Calcium: 8.4 mg/dL — ABNORMAL LOW (ref 8.9–10.3)
Chloride: 104 mmol/L (ref 98–111)
Creatinine, Ser: 1.56 mg/dL — ABNORMAL HIGH (ref 0.44–1.00)
GFR, Estimated: 32 mL/min — ABNORMAL LOW (ref >60)
Glucose, Bld: 226 mg/dL — ABNORMAL HIGH (ref 70–99)
Potassium: 4.5 mmol/L (ref 3.5–5.1)
Sodium: 132 mmol/L — ABNORMAL LOW (ref 135–145)
Total Bilirubin: 0.8 mg/dL (ref 0.3–1.2)
Total Protein: 5.5 g/dL — ABNORMAL LOW (ref 6.5–8.1)

## 2021-04-23 LAB — C-REACTIVE PROTEIN: CRP: 5.6 mg/dL — ABNORMAL HIGH (ref <1.0)

## 2021-04-23 LAB — GLUCOSE, CAPILLARY
Glucose-Capillary: 201 mg/dL — ABNORMAL HIGH (ref 70–99)
Glucose-Capillary: 221 mg/dL — ABNORMAL HIGH (ref 70–99)
Glucose-Capillary: 221 mg/dL — ABNORMAL HIGH (ref 70–99)
Glucose-Capillary: 269 mg/dL — ABNORMAL HIGH (ref 70–99)

## 2021-04-23 LAB — CULTURE, BLOOD (ROUTINE X 2)
Culture: NO GROWTH
Culture: NO GROWTH

## 2021-04-23 LAB — UREA NITROGEN, URINE: Urea Nitrogen, Ur: 786 mg/dL

## 2021-04-23 LAB — MAGNESIUM: Magnesium: 2.6 mg/dL — ABNORMAL HIGH (ref 1.7–2.4)

## 2021-04-23 LAB — PROCALCITONIN: Procalcitonin: 3.69 ng/mL

## 2021-04-23 LAB — HEMOGLOBIN AND HEMATOCRIT, BLOOD
HCT: 27.1 % — ABNORMAL LOW (ref 36.0–46.0)
Hemoglobin: 8.9 g/dL — ABNORMAL LOW (ref 12.0–15.0)

## 2021-04-23 LAB — BRAIN NATRIURETIC PEPTIDE: B Natriuretic Peptide: 998.8 pg/mL — ABNORMAL HIGH (ref 0.0–100.0)

## 2021-04-23 MED ORDER — INSULIN GLARGINE-YFGN 100 UNIT/ML ~~LOC~~ SOLN
10.0000 [IU] | Freq: Every day | SUBCUTANEOUS | Status: DC
  Administered 2021-04-24: 11:00:00 10 [IU] via SUBCUTANEOUS
  Filled 2021-04-23: qty 0.1

## 2021-04-23 MED ORDER — LACTATED RINGERS IV SOLN: INTRAVENOUS | Status: AC

## 2021-04-23 NOTE — Progress Notes (Signed)
Speech Language Pathology Treatment: Dysphagia  Patient Details Name: Sheila Morgan MRN: [id number] DOB: 09/08/1932 Today's Date: 04/23/2021 Time: 2703-5009 SLP Time Calculation (min) (ACUTE ONLY): 10 min  Assessment / Plan / Recommendation Clinical Impression  Pt seen for follow-up dysphagia therapy, alert and upright in chair. She self-fed consecutive sips of thin liquids via straw exhibiting no overt s/sx of aspiration across x3 trials. Prolonged mastication/bolus formation of regular textures persists likely due to lack of dentition. Oral clearance of solids noted with independent lingual sweep and liquid wash. Dys 2, thin liquid diet remains appropriate for pt at this time. Recommend adherence to universal swallow precautions. No further SLP f/u warranted. Will s/o.   HPI HPI: Patient is an 87 y.o. female with PMH: coronary artery disease status post CABG, type 2 diabetes mellitus, paroxysmal atrial fibrillation on Xarelto, hypothyroidism, history of DVT, CKD 3A, and recent hospital admission who returns to the emergency department with somnolence and low oxygen saturation at her SNF.  Patient was admitted to the hospital on 04/05/2021 and discharged on 12/15. Upon arrival to ED, patient was afebrile, oxygen saturations 100% on 2L supplemental oxygen, she was tachypneic as high as 31 with stable BP. CT chest revealed Small bilateral pleural effusions with associated bibasilar  atelectasis. No definite superimposed focal pulmonary infiltrate. She continues to be on COVID isolation precautions.      SLP Plan  Discharge SLP treatment due to (comment);All goals met      Recommendations for follow up therapy are one component of a multi-disciplinary discharge planning process, led by the attending physician.  Recommendations may be updated based on patient status, additional functional criteria and insurance authorization.    Recommendations  Diet recommendations: Dysphagia 2 (fine chop);Thin  liquid Liquids provided via: Cup;Straw Medication Administration: Whole meds with puree Supervision: Patient able to self feed Compensations: Minimize environmental distractions;Slow rate;Small sips/bites Postural Changes and/or Swallow Maneuvers: Seated upright 90 degrees                Oral Care Recommendations: Oral care BID;Staff/trained caregiver to provide oral care Follow Up Recommendations: No SLP follow up Assistance recommended at discharge: None SLP Visit Diagnosis: Dysphagia, unspecified (R13.10) Plan: Discharge SLP treatment due to (comment);All goals met         Sheila Morgan, Sheila Morgan, [name]   Sheila Morgan  04/23/2021, 1:38 PM

## 2021-04-23 NOTE — Progress Notes (Signed)
PROGRESS NOTE                                                                                                                                                                                                             Patient Demographics:    Sheila Morgan, is a 87 y.o. female, DOB - Apr 19, 1933, NTZ:[id number]  Outpatient Primary MD for the patient is Binnie Rail, MD    LOS - 4  Admit date - 04/18/2021    Chief Complaint  Patient presents with   Altered Mental Status       Brief Narrative (HPI from H&P) -  Sheila Morgan is a pleasant 87 y.o. female with medical history significant for coronary artery disease status post CABG, type 2 diabetes mellitus, paroxysmal atrial fibrillation on Xarelto, hypothyroidism, history of DVT, CKD 3A, and recent hospital admission who returns to the emergency department with somnolence and low oxygen saturation at her SNF.   Subjective:   Patient in bed, appears comfortable, denies any headache, no fever, no chest pain or pressure, no shortness of breath , no abdominal pain. No new focal weakness.    Assessment  & Plan :     Acute metabolic encephalopathy due to acute on chronic hypoxic respiratory failure caused by CHF - this is acute on chronic diastolic CHF EF 67%, precipitated by RVR, resolved after Lasix will hold further diuretics as creatinine has bumped, oxygenation and mentation improving, CT head unremarkable, no focal deficits, continue to monitor with diuretics, oxygen supplementation and supportive care.  2.  Chronic A. fib in RVR.  Mali vas 2 score of greater than 3.  Continue beta-blocker, Cardizem dose increased for better rate control, digoxin 2 doses on 04/20/2021, resumed Xarelto for anticoagulation.  3.  COVID-19 infection.  Initially diagnosed on 04/05/2021.  CT chest unremarkable with no infiltrate, CRP was quite elevated hence darted on trial of Decadron with  improvement in inflammatory markers continue to monitor.  4.  Hypothyroidism on Synthroid with stable TSH.  5.  AKI On CKD 3A.  Baseline creatinine around 1.3.  Hold diuretics , IVF today, monitor.  6.  Dyslipidemia.  On statin.  7. CAD s/p CABG.  No acute issues.  On beta-blocker and statin for secondary prevention along with Xarelto instead of aspirin.  8. HTN - BP meds adjusted 04/21/21,  monitor.  9. DM Type II.  On sliding scale, since placed on Decadron will add low-dose Lantus and monitor.  Lab Results  Component Value Date   HGBA1C 7.2 (H) 04/05/2021    CBG (last 3)  Recent Labs    04/22/21 1627 04/22/21 2048 04/23/21 0759  GLUCAP 220* 263* 201*         Condition - Extremely Guarded  Family Communication  :    Son and Campbell Lerner [phone] - 04/21/21, 04/23/21  Code Status :  Full  Consults  :  None  PUD Prophylaxis :     Procedures  :     TTE - 1. Left ventricular ejection fraction, by estimation, is 50%. The left ventricle has mildly decreased function. The left ventricle demonstrates global hypokinesis. There is mild concentric left ventricular hypertrophy. Left ventricular diastolic parameters are indeterminate.  2. Right ventricular systolic function was not well visualized. The right ventricular size is normal. There is mildly elevated pulmonary artery systolic pressure. The estimated right ventricular systolic pressure is 66.0 mmHg.  3. Left atrial size was moderately dilated.  4. The mitral valve is degenerative. Mild mitral valve regurgitation.  5. Tricuspid valve regurgitation is moderate.  6. The aortic valve is tricuspid. There is mild calcification of the aortic valve. There is mild thickening of the aortic valve. Aortic valve regurgitation is not visualized. Aortic valve sclerosis is present, with no evidence of aortic valve stenosis  CT chest- Small bilateral pleural effusions with associated bibasilar atelectasis. No definite superimposed focal  pulmonary infiltrate. Cholelithiasis. Lobulated cystic structure within the splenic hilum, poorly characterized on this examination. Comparison with prior examinations, if available, would be helpful in determining chronicity. If none are available, this would be better assessed with dedicated pancreatic protocol CT or MRI examination, if clinically indicated. Aortic Atherosclerosis   CT Head - non acute      Disposition Plan  :    Status is: Inpatient  Remains inpatient appropriate because: Afib  DVT Prophylaxis  :  Xaralto  Place TED hose Start: 04/21/21 1116 SCDs Start: 04/19/21 0345 Rivaroxaban (XARELTO) tablet 15 mg     Lab Results  Component Value Date   PLT 170 04/23/2021    Diet :  Diet Order             DIET DYS 2 Room service appropriate? No; Fluid consistency: Thin  Diet effective now                    Inpatient Medications  Scheduled Meds:  carvedilol  12.5 mg Oral BID WC   colchicine  0.6 mg Oral QODAY   dexamethasone (DECADRON) injection  6 mg Intravenous Q24H   diltiazem  120 mg Oral Daily   Gerhardt's butt cream   Topical BID   insulin aspart  0-5 Units Subcutaneous QHS   insulin aspart  0-9 Units Subcutaneous TID WC   [START ON 04/24/2021] insulin glargine-yfgn  10 Units Subcutaneous Daily   levothyroxine  50 mcg Oral Q0600   polyethylene glycol  17 g Oral Daily   Rivaroxaban  15 mg Oral Q supper   senna-docusate  1 tablet Oral BID   simvastatin  40 mg Oral QPM   sodium chloride flush  3 mL Intravenous Q12H   Continuous Infusions:   PRN Meds:.acetaminophen **OR** acetaminophen, albuterol, diltiazem, metoprolol tartrate  Antibiotics  :    Anti-infectives (From admission, onward)    Start     Dose/Rate Route Frequency Ordered Stop  04/19/21 0000  ceFEPIme (MAXIPIME) 2 g in sodium chloride 0.9 % 100 mL IVPB        2 g 200 mL/hr over 30 Minutes Intravenous  Once 04/18/21 2353 04/19/21 0156        Time Spent in minutes   30   Lala Lund M.D on 04/23/2021 at 11:24 AM  To page go to [email or web address]   [facility] -  Office  [phone]  See all Orders from today for further details    Objective:   Vitals:   04/22/21 2047 04/23/21 0000 04/23/21 0400 04/23/21 0800  BP: (!) 131/91 (!) 138/104 (!) 143/80 (!) 142/99  Pulse: 89 94  100  Resp: 20 17 20 20   Temp: 98 F (36.7 C) (!) 97.5 F (36.4 C)  (!) 97.5 F (36.4 C)  TempSrc: Oral Axillary  Axillary  SpO2: 96% 97% 96% 98%  Weight:      Height:        Wt Readings from Last 3 Encounters:  04/21/21 70 kg  04/05/21 68.9 kg  01/23/21 71.2 kg     Intake/Output Summary (Last 24 hours) at 04/23/2021 1124 Last data filed at 04/23/2021 0800 Gross per 24 hour  Intake 237 ml  Output 1750 ml  Net -1513 ml     Physical Exam  Awake Alert, No new F.N deficits, Normal affect [place].AT,PERRAL Supple Neck, No JVD,   Symmetrical Chest wall movement, Good air movement bilaterally, CTAB RRR,No Gallops, Rubs or new Murmurs,  +ve B.Sounds, Abd Soft, No tenderness,   No Cyanosis, Clubbing or edema     RN pressure injury documentation: Pressure Injury 04/19/21 Sacrum Lower;Posterior Unstageable - Full thickness tissue loss in which the base of the injury is covered by slough (yellow, tan, gray, green or brown) and/or eschar (tan, brown or black) in the wound bed. wound consult placed (Active)  04/19/21 1755  Location: Sacrum  Location Orientation: Lower;Posterior  Staging: Unstageable - Full thickness tissue loss in which the base of the injury is covered by slough (yellow, tan, gray, green or brown) and/or eschar (tan, brown or black) in the wound bed.  Wound Description (Comments): wound consult placed  Present on Admission: Yes     Data Review:    CBC Recent Labs  Lab 04/17/21 0441 04/18/21 2243 04/19/21 1139 04/20/21 0105 04/20/21 1700 04/21/21 0752 04/21/21 1629 04/22/21 0530 04/22/21 1637 04/23/21 0441  WBC 20.3* 19.5*   --  15.3*  --  11.6*  --  8.8  --  7.5  HGB 12.0 9.6*   < > 8.2*   < > 8.4* 9.1* 8.9* 9.2* 8.7*  HCT 36.8 30.6*   < > 26.1*   < > 26.7* 27.5* 27.3* 28.3* 27.6*  PLT 234 148*  --  140*  --  148*  --  174  --  170  MCV 93.6 96.2  --  94.6  --  94.7  --  92.5  --  92.6  MCH 30.5 30.2  --  29.7  --  29.8  --  30.2  --  29.2  MCHC 32.6 31.4  --  31.4  --  31.5  --  32.6  --  31.5  RDW 15.2 15.5  --  15.7*  --  15.4  --  15.5  --  15.2  LYMPHSABS 1.0 0.7  --   --   --  0.3*  --  0.3*  --  0.3*  MONOABS 1.6* 2.5*  --   --   --  0.4  --  0.3  --  0.2  EOSABS 0.1 0.0  --   --   --  0.0  --  0.0  --  0.0  BASOSABS 0.0 0.0  --   --   --  0.0  --  0.0  --  0.0   < > = values in this interval not displayed.    Electrolytes Recent Labs  Lab 04/18/21 2243 04/18/21 2246 04/18/21 2250 04/19/21 0530 04/19/21 1139 04/20/21 0105 04/21/21 0752 04/22/21 0530 04/23/21 0441  NA 134*  --   --   --  141 137 135 136 132*  K 4.3  --   --   --  3.8 3.9 5.1 4.5 4.5  CL 105  --   --   --   --  108 106 106 104  CO2 23  --   --   --   --  22 22 20* 21*  GLUCOSE 155*  --   --   --   --  129* 190* 212* 226*  BUN 25*  --   --   --   --  28* 36* 45* 53*  CREATININE 1.44*  --   --   --   --  1.36* 1.48* 1.63* 1.56*  CALCIUM 8.3*  --   --   --   --  8.1* 8.1* 8.3* 8.4*  AST 19  --   --   --   --  16 16 32 20  ALT 37  --   --   --   --  25 26 46* 42  ALKPHOS 59  --   --   --   --  62 76 88 79  BILITOT 1.2  --   --   --   --  1.3* 1.0 1.0 0.8  ALBUMIN 1.7*  --   --   --   --  <1.5* 1.6* 1.7* 1.7*  MG  --   --   --   --   --   --  2.3 2.4 2.6*  CRP  --   --   --  36.1*  --   --  25.4* 12.9* 5.6*  DDIMER  --   --   --  1.07*  --   --   --   --   --   PROCALCITON  --   --   --  19.87  --  19.50 9.80 6.90 3.69  LATICACIDVEN  --   --  1.0  --   --   --   --   --   --   INR  --  4.8*  --   --   --  1.9* 3.6*  --   --   TSH  --   --   --  0.495  --   --   --   --   --   AMMONIA  --   --   --  14  --   --   --   --    --   BNP  --   --   --   --   --  982.9* 941.0* 897.4* 998.8*    ------------------------------------------------------------------------------------------------------------------ No results for input(s): CHOL, HDL, LDLCALC, TRIG, CHOLHDL, LDLDIRECT in the last 72 hours.  Lab Results  Component Value Date   HGBA1C 7.2 (H) 04/05/2021    No results for input(s): TSH, T4TOTAL, T3FREE, THYROIDAB in the last 72 hours.  Invalid input(s):  [place]      Radiology Reports DG Chest Port 1 View  Result Date: 04/21/2021 CLINICAL DATA:  Shortness of breath. EXAM: PORTABLE CHEST 1 VIEW COMPARISON:  Portable chest yesterday at 8:51 a.m. FINDINGS: Slightly increased small to moderate-sized pleural effusions right greater than left with continued opacities of the lower lung fields consistent with atelectasis or consolidation. The left mid and both upper lung fields remain clear. There is cardiomegaly, CABG changes and mild central vascular distension increasing as well as slight central edema. The aorta is tortuous and ectatic with calcifications. Osteopenia. IMPRESSION: 1. Increased perihilar vascular congestion and slight central edema. 2. Slightly increased small or moderate sized pleural effusions right greater than left, with persistent overlying opacities. 3. Aortic atherosclerosis. Electronically Signed   By: Telford Nab M.D.   On: 04/21/2021 05:45   DG Chest Port 1 View  Result Date: 04/20/2021 CLINICAL DATA:  Shortness of breath EXAM: PORTABLE CHEST 1 VIEW COMPARISON:  CT chest performed on the same date. FINDINGS: The heart is enlarged. Evidence of prior coronary artery bypass grafting. Small bilateral pleural effusions with bibasilar atelectasis. IMPRESSION: 1.  Cardiomegaly. 2.  Small bilateral pleural effusions with bibasilar atelectasis. Electronically Signed   By: Keane Police D.O.   On: 04/20/2021 09:08   ECHOCARDIOGRAM COMPLETE  Result Date: 04/19/2021    ECHOCARDIOGRAM REPORT    Patient Name:   Sheila Morgan Date of Exam: 04/19/2021 Medical Rec #:  [id number]       Height:       64.0 in Accession #:    [id number]      Weight:       151.9 lb Date of Birth:  October 05, 1932       BSA:          1.740 m Patient Age:    72 years        BP:           145/93 mmHg Patient Gender: F               HR:           93 bpm. Exam Location:  Inpatient Procedure: 2D Echo, Cardiac Doppler and Color Doppler Indications:    Atrial fibrillation  History:        Patient has prior history of Echocardiogram examinations, most                 recent 12/27/2020. Previous Myocardial Infarction and CAD,                 Arrythmias:Atrial Fibrillation; Risk Factors:Hypertension and                 Dyslipidemia.  Sonographer:    Merrie Roof RDCS Referring Phys: [id number] [facility]  1. Left ventricular ejection fraction, by estimation, is 50%. The left ventricle has mildly decreased function. The left ventricle demonstrates global hypokinesis. There is mild concentric left ventricular hypertrophy. Left ventricular diastolic parameters are indeterminate.  2. Right ventricular systolic function was not well visualized. The right ventricular size is normal. There is mildly elevated pulmonary artery systolic pressure. The estimated right ventricular systolic pressure is 43.3 mmHg.  3. Left atrial size was moderately dilated.  4. The mitral valve is degenerative. Mild mitral valve regurgitation.  5. Tricuspid valve regurgitation is moderate.  6. The aortic valve is tricuspid. There is mild calcification of the aortic valve. There is mild thickening of the aortic valve. Aortic valve regurgitation is not visualized. Aortic  valve sclerosis is present, with no evidence of aortic valve stenosis. Comparison(s): A prior study was performed on 12/27/20. In AF RVR, LV appears decrease and mitral and tricuspid valve disease appear worse. FINDINGS  Left Ventricle: Left ventricular ejection fraction, by estimation, is 50%. The left  ventricle has mildly decreased function. The left ventricle demonstrates global hypokinesis. The left ventricular internal cavity size was normal in size. There is mild concentric left ventricular hypertrophy. Left ventricular diastolic parameters are indeterminate. Right Ventricle: The right ventricular size is normal. No increase in right ventricular wall thickness. Right ventricular systolic function was not well visualized. There is mildly elevated pulmonary artery systolic pressure. The tricuspid regurgitant velocity is 3.14 m/s, and with an assumed right atrial pressure of 3 mmHg, the estimated right ventricular systolic pressure is 01.7 mmHg. Left Atrium: Left atrial size was moderately dilated. Right Atrium: Right atrial size was normal in size. Pericardium: There is no evidence of pericardial effusion. Mitral Valve: The mitral valve is degenerative in appearance. Mild to moderate mitral annular calcification. Mild mitral valve regurgitation. Tricuspid Valve: The tricuspid valve is normal in structure. Tricuspid valve regurgitation is moderate . No evidence of tricuspid stenosis. Aortic Valve: The aortic valve is tricuspid. There is mild calcification of the aortic valve. There is mild thickening of the aortic valve. There is mild aortic valve annular calcification. Aortic valve regurgitation is not visualized. Aortic valve sclerosis is present, with no evidence of aortic valve stenosis. Aortic valve mean gradient measures 5.0 mmHg. Aortic valve peak gradient measures 9.5 mmHg. Aortic valve area, by VTI measures 1.87 cm. Pulmonic Valve: The pulmonic valve was not well visualized. Pulmonic valve regurgitation is not visualized. No evidence of pulmonic stenosis. Aorta: The aortic root and ascending aorta are structurally normal, with no evidence of dilitation. IAS/Shunts: The atrial septum is grossly normal.  LEFT VENTRICLE PLAX 2D LVIDd:         3.80 cm LVIDs:         2.90 cm LV PW:         1.10 cm LV IVS:         1.00 cm LVOT diam:     1.80 cm LV SV:         38 LV SV Index:   22 LVOT Area:     2.54 cm  RIGHT VENTRICLE RV S prime:     7.95 cm/s LEFT ATRIUM             Index LA diam:        3.70 cm 2.13 cm/m LA Vol (A2C):   68.9 ml 39.59 ml/m LA Vol (A4C):   72.3 ml 41.54 ml/m LA Biplane Vol: 72.7 ml 41.77 ml/m  AORTIC VALVE AV Area (Vmax):    1.47 cm AV Area (Vmean):   1.30 cm AV Area (VTI):     1.87 cm AV Vmax:           154.00 cm/s AV Vmean:          103.000 cm/s AV VTI:            0.205 m AV Peak Grad:      9.5 mmHg AV Mean Grad:      5.0 mmHg LVOT Vmax:         89.20 cm/s LVOT Vmean:        52.500 cm/s LVOT VTI:          0.151 m LVOT/AV VTI ratio: 0.74  AORTA Ao Root diam: 2.70 cm  Ao Asc diam:  3.00 cm MR Peak grad:    107.7 mmHg   TRICUSPID VALVE MR Mean grad:    72.0 mmHg    TR Peak grad:   39.4 mmHg MR Vmax:         519.00 cm/s  TR Vmax:        314.00 cm/s MR Vmean:        408.0 cm/s MR PISA:         0.57 cm     SHUNTS MR PISA Eff ROA: 3 mm        Systemic VTI:  0.15 m MR PISA Radius:  0.30 cm      Systemic Diam: 1.80 cm Rudean Haskell MD Electronically signed by Rudean Haskell MD Signature Date/Time: 04/19/2021/4:04:51 PM    Final

## 2021-04-24 DIAGNOSIS — U099 Post covid-19 condition, unspecified: Secondary | ICD-10-CM | POA: Diagnosis not present

## 2021-04-24 DIAGNOSIS — J90 Pleural effusion, not elsewhere classified: Secondary | ICD-10-CM | POA: Diagnosis not present

## 2021-04-24 DIAGNOSIS — Z66 Do not resuscitate: Secondary | ICD-10-CM | POA: Diagnosis not present

## 2021-04-24 DIAGNOSIS — J1282 Pneumonia due to coronavirus disease 2019: Secondary | ICD-10-CM | POA: Diagnosis not present

## 2021-04-24 DIAGNOSIS — Z743 Need for continuous supervision: Secondary | ICD-10-CM | POA: Diagnosis not present

## 2021-04-24 DIAGNOSIS — R059 Cough, unspecified: Secondary | ICD-10-CM | POA: Diagnosis not present

## 2021-04-24 DIAGNOSIS — I459 Conduction disorder, unspecified: Secondary | ICD-10-CM | POA: Diagnosis not present

## 2021-04-24 DIAGNOSIS — R6510 Systemic inflammatory response syndrome (SIRS) of non-infectious origin without acute organ dysfunction: Secondary | ICD-10-CM | POA: Diagnosis not present

## 2021-04-24 DIAGNOSIS — I071 Rheumatic tricuspid insufficiency: Secondary | ICD-10-CM | POA: Diagnosis present

## 2021-04-24 DIAGNOSIS — E440 Moderate protein-calorie malnutrition: Secondary | ICD-10-CM | POA: Diagnosis present

## 2021-04-24 DIAGNOSIS — R0602 Shortness of breath: Secondary | ICD-10-CM | POA: Diagnosis not present

## 2021-04-24 DIAGNOSIS — K2970 Gastritis, unspecified, without bleeding: Secondary | ICD-10-CM | POA: Diagnosis not present

## 2021-04-24 DIAGNOSIS — L89150 Pressure ulcer of sacral region, unstageable: Secondary | ICD-10-CM | POA: Diagnosis not present

## 2021-04-24 DIAGNOSIS — R1311 Dysphagia, oral phase: Secondary | ICD-10-CM | POA: Diagnosis not present

## 2021-04-24 DIAGNOSIS — I509 Heart failure, unspecified: Secondary | ICD-10-CM | POA: Diagnosis not present

## 2021-04-24 DIAGNOSIS — L89159 Pressure ulcer of sacral region, unspecified stage: Secondary | ICD-10-CM | POA: Diagnosis not present

## 2021-04-24 DIAGNOSIS — G9340 Encephalopathy, unspecified: Secondary | ICD-10-CM | POA: Diagnosis not present

## 2021-04-24 DIAGNOSIS — E790 Hyperuricemia without signs of inflammatory arthritis and tophaceous disease: Secondary | ICD-10-CM | POA: Diagnosis not present

## 2021-04-24 DIAGNOSIS — J9811 Atelectasis: Secondary | ICD-10-CM | POA: Diagnosis present

## 2021-04-24 DIAGNOSIS — R0609 Other forms of dyspnea: Secondary | ICD-10-CM | POA: Diagnosis not present

## 2021-04-24 DIAGNOSIS — E872 Acidosis, unspecified: Secondary | ICD-10-CM | POA: Diagnosis present

## 2021-04-24 DIAGNOSIS — E119 Type 2 diabetes mellitus without complications: Secondary | ICD-10-CM | POA: Diagnosis not present

## 2021-04-24 DIAGNOSIS — N183 Chronic kidney disease, stage 3 unspecified: Secondary | ICD-10-CM | POA: Diagnosis not present

## 2021-04-24 DIAGNOSIS — I10 Essential (primary) hypertension: Secondary | ICD-10-CM | POA: Diagnosis not present

## 2021-04-24 DIAGNOSIS — Z8616 Personal history of COVID-19: Secondary | ICD-10-CM | POA: Diagnosis not present

## 2021-04-24 DIAGNOSIS — E1122 Type 2 diabetes mellitus with diabetic chronic kidney disease: Secondary | ICD-10-CM | POA: Diagnosis not present

## 2021-04-24 DIAGNOSIS — N1831 Chronic kidney disease, stage 3a: Secondary | ICD-10-CM | POA: Diagnosis present

## 2021-04-24 DIAGNOSIS — R278 Other lack of coordination: Secondary | ICD-10-CM | POA: Diagnosis not present

## 2021-04-24 DIAGNOSIS — Z9189 Other specified personal risk factors, not elsewhere classified: Secondary | ICD-10-CM | POA: Diagnosis not present

## 2021-04-24 DIAGNOSIS — I152 Hypertension secondary to endocrine disorders: Secondary | ICD-10-CM | POA: Diagnosis not present

## 2021-04-24 DIAGNOSIS — Z7401 Bed confinement status: Secondary | ICD-10-CM | POA: Diagnosis not present

## 2021-04-24 DIAGNOSIS — M6259 Muscle wasting and atrophy, not elsewhere classified, multiple sites: Secondary | ICD-10-CM | POA: Diagnosis not present

## 2021-04-24 DIAGNOSIS — M353 Polymyalgia rheumatica: Secondary | ICD-10-CM | POA: Diagnosis not present

## 2021-04-24 DIAGNOSIS — I5033 Acute on chronic diastolic (congestive) heart failure: Secondary | ICD-10-CM | POA: Diagnosis not present

## 2021-04-24 DIAGNOSIS — E46 Unspecified protein-calorie malnutrition: Secondary | ICD-10-CM | POA: Diagnosis not present

## 2021-04-24 DIAGNOSIS — R601 Generalized edema: Secondary | ICD-10-CM | POA: Diagnosis not present

## 2021-04-24 DIAGNOSIS — I499 Cardiac arrhythmia, unspecified: Secondary | ICD-10-CM | POA: Diagnosis not present

## 2021-04-24 DIAGNOSIS — A419 Sepsis, unspecified organism: Secondary | ICD-10-CM | POA: Diagnosis not present

## 2021-04-24 DIAGNOSIS — R9389 Abnormal findings on diagnostic imaging of other specified body structures: Secondary | ICD-10-CM | POA: Diagnosis not present

## 2021-04-24 DIAGNOSIS — R5381 Other malaise: Secondary | ICD-10-CM | POA: Diagnosis not present

## 2021-04-24 DIAGNOSIS — R2681 Unsteadiness on feet: Secondary | ICD-10-CM | POA: Diagnosis not present

## 2021-04-24 DIAGNOSIS — E1151 Type 2 diabetes mellitus with diabetic peripheral angiopathy without gangrene: Secondary | ICD-10-CM | POA: Diagnosis not present

## 2021-04-24 DIAGNOSIS — I482 Chronic atrial fibrillation, unspecified: Secondary | ICD-10-CM | POA: Diagnosis not present

## 2021-04-24 DIAGNOSIS — D631 Anemia in chronic kidney disease: Secondary | ICD-10-CM | POA: Diagnosis present

## 2021-04-24 DIAGNOSIS — E871 Hypo-osmolality and hyponatremia: Secondary | ICD-10-CM | POA: Diagnosis not present

## 2021-04-24 DIAGNOSIS — F03A18 Unspecified dementia, mild, with other behavioral disturbance: Secondary | ICD-10-CM | POA: Diagnosis present

## 2021-04-24 DIAGNOSIS — U071 COVID-19: Secondary | ICD-10-CM | POA: Diagnosis not present

## 2021-04-24 DIAGNOSIS — E1159 Type 2 diabetes mellitus with other circulatory complications: Secondary | ICD-10-CM | POA: Diagnosis not present

## 2021-04-24 DIAGNOSIS — I480 Paroxysmal atrial fibrillation: Secondary | ICD-10-CM | POA: Diagnosis not present

## 2021-04-24 DIAGNOSIS — R638 Other symptoms and signs concerning food and fluid intake: Secondary | ICD-10-CM | POA: Diagnosis not present

## 2021-04-24 DIAGNOSIS — R2689 Other abnormalities of gait and mobility: Secondary | ICD-10-CM | POA: Diagnosis not present

## 2021-04-24 DIAGNOSIS — R1312 Dysphagia, oropharyngeal phase: Secondary | ICD-10-CM | POA: Diagnosis not present

## 2021-04-24 DIAGNOSIS — I4891 Unspecified atrial fibrillation: Secondary | ICD-10-CM | POA: Diagnosis not present

## 2021-04-24 DIAGNOSIS — I5021 Acute systolic (congestive) heart failure: Secondary | ICD-10-CM | POA: Diagnosis not present

## 2021-04-24 DIAGNOSIS — L89153 Pressure ulcer of sacral region, stage 3: Secondary | ICD-10-CM | POA: Diagnosis not present

## 2021-04-24 DIAGNOSIS — I130 Hypertensive heart and chronic kidney disease with heart failure and stage 1 through stage 4 chronic kidney disease, or unspecified chronic kidney disease: Secondary | ICD-10-CM | POA: Diagnosis not present

## 2021-04-24 DIAGNOSIS — D649 Anemia, unspecified: Secondary | ICD-10-CM | POA: Diagnosis not present

## 2021-04-24 DIAGNOSIS — R001 Bradycardia, unspecified: Secondary | ICD-10-CM | POA: Diagnosis not present

## 2021-04-24 DIAGNOSIS — R262 Difficulty in walking, not elsewhere classified: Secondary | ICD-10-CM | POA: Diagnosis not present

## 2021-04-24 DIAGNOSIS — K2901 Acute gastritis with bleeding: Secondary | ICD-10-CM | POA: Diagnosis present

## 2021-04-24 DIAGNOSIS — R4182 Altered mental status, unspecified: Secondary | ICD-10-CM | POA: Diagnosis not present

## 2021-04-24 DIAGNOSIS — I2510 Atherosclerotic heart disease of native coronary artery without angina pectoris: Secondary | ICD-10-CM | POA: Diagnosis not present

## 2021-04-24 DIAGNOSIS — Z515 Encounter for palliative care: Secondary | ICD-10-CM | POA: Diagnosis not present

## 2021-04-24 DIAGNOSIS — I272 Pulmonary hypertension, unspecified: Secondary | ICD-10-CM | POA: Diagnosis present

## 2021-04-24 DIAGNOSIS — Z951 Presence of aortocoronary bypass graft: Secondary | ICD-10-CM | POA: Diagnosis not present

## 2021-04-24 DIAGNOSIS — I517 Cardiomegaly: Secondary | ICD-10-CM | POA: Diagnosis not present

## 2021-04-24 DIAGNOSIS — R627 Adult failure to thrive: Secondary | ICD-10-CM | POA: Diagnosis not present

## 2021-04-24 DIAGNOSIS — K921 Melena: Secondary | ICD-10-CM | POA: Diagnosis not present

## 2021-04-24 DIAGNOSIS — D6859 Other primary thrombophilia: Secondary | ICD-10-CM | POA: Diagnosis not present

## 2021-04-24 DIAGNOSIS — R770 Abnormality of albumin: Secondary | ICD-10-CM | POA: Diagnosis not present

## 2021-04-24 DIAGNOSIS — E8809 Other disorders of plasma-protein metabolism, not elsewhere classified: Secondary | ICD-10-CM | POA: Diagnosis not present

## 2021-04-24 DIAGNOSIS — R570 Cardiogenic shock: Secondary | ICD-10-CM | POA: Diagnosis not present

## 2021-04-24 DIAGNOSIS — I110 Hypertensive heart disease with heart failure: Secondary | ICD-10-CM | POA: Diagnosis not present

## 2021-04-24 DIAGNOSIS — R41841 Cognitive communication deficit: Secondary | ICD-10-CM | POA: Diagnosis not present

## 2021-04-24 DIAGNOSIS — G9341 Metabolic encephalopathy: Secondary | ICD-10-CM | POA: Diagnosis not present

## 2021-04-24 DIAGNOSIS — N179 Acute kidney failure, unspecified: Secondary | ICD-10-CM | POA: Diagnosis not present

## 2021-04-24 DIAGNOSIS — M6281 Muscle weakness (generalized): Secondary | ICD-10-CM | POA: Diagnosis not present

## 2021-04-24 DIAGNOSIS — J9600 Acute respiratory failure, unspecified whether with hypoxia or hypercapnia: Secondary | ICD-10-CM | POA: Diagnosis not present

## 2021-04-24 DIAGNOSIS — M533 Sacrococcygeal disorders, not elsewhere classified: Secondary | ICD-10-CM | POA: Diagnosis not present

## 2021-04-24 DIAGNOSIS — M109 Gout, unspecified: Secondary | ICD-10-CM | POA: Diagnosis not present

## 2021-04-24 DIAGNOSIS — D62 Acute posthemorrhagic anemia: Secondary | ICD-10-CM | POA: Diagnosis not present

## 2021-04-24 DIAGNOSIS — R34 Anuria and oliguria: Secondary | ICD-10-CM | POA: Diagnosis not present

## 2021-04-24 DIAGNOSIS — E039 Hypothyroidism, unspecified: Secondary | ICD-10-CM | POA: Diagnosis present

## 2021-04-24 DIAGNOSIS — J9601 Acute respiratory failure with hypoxia: Secondary | ICD-10-CM | POA: Diagnosis not present

## 2021-04-24 DIAGNOSIS — I959 Hypotension, unspecified: Secondary | ICD-10-CM | POA: Diagnosis not present

## 2021-04-24 DIAGNOSIS — R531 Weakness: Secondary | ICD-10-CM | POA: Diagnosis not present

## 2021-04-24 DIAGNOSIS — R609 Edema, unspecified: Secondary | ICD-10-CM | POA: Diagnosis not present

## 2021-04-24 LAB — COMPREHENSIVE METABOLIC PANEL
ALT: 34 U/L (ref 0–44)
AST: 15 U/L (ref 15–41)
Albumin: 1.7 g/dL — ABNORMAL LOW (ref 3.5–5.0)
Alkaline Phosphatase: 75 U/L (ref 38–126)
Anion gap: 6 (ref 5–15)
BUN: 49 mg/dL — ABNORMAL HIGH (ref 8–23)
CO2: 23 mmol/L (ref 22–32)
Calcium: 8.7 mg/dL — ABNORMAL LOW (ref 8.9–10.3)
Chloride: 105 mmol/L (ref 98–111)
Creatinine, Ser: 1.36 mg/dL — ABNORMAL HIGH (ref 0.44–1.00)
GFR, Estimated: 38 mL/min — ABNORMAL LOW (ref >60)
Glucose, Bld: 204 mg/dL — ABNORMAL HIGH (ref 70–99)
Potassium: 4.6 mmol/L (ref 3.5–5.1)
Sodium: 134 mmol/L — ABNORMAL LOW (ref 135–145)
Total Bilirubin: 0.5 mg/dL (ref 0.3–1.2)
Total Protein: 5.3 g/dL — ABNORMAL LOW (ref 6.5–8.1)

## 2021-04-24 LAB — CBC WITH DIFFERENTIAL/PLATELET
Abs Immature Granulocytes: 0.05 10*3/uL (ref 0.00–0.07)
Basophils Absolute: 0 10*3/uL (ref 0.0–0.1)
Basophils Relative: 0 %
Eosinophils Absolute: 0 10*3/uL (ref 0.0–0.5)
Eosinophils Relative: 0 %
HCT: 27.6 % — ABNORMAL LOW (ref 36.0–46.0)
Hemoglobin: 9 g/dL — ABNORMAL LOW (ref 12.0–15.0)
Immature Granulocytes: 1 %
Lymphocytes Relative: 4 %
Lymphs Abs: 0.3 10*3/uL — ABNORMAL LOW (ref 0.7–4.0)
MCH: 30.5 pg (ref 26.0–34.0)
MCHC: 32.6 g/dL (ref 30.0–36.0)
MCV: 93.6 fL (ref 80.0–100.0)
Monocytes Absolute: 0.2 10*3/uL (ref 0.1–1.0)
Monocytes Relative: 2 %
Neutro Abs: 8.8 10*3/uL — ABNORMAL HIGH (ref 1.7–7.7)
Neutrophils Relative %: 93 %
Platelets: 220 10*3/uL (ref 150–400)
RBC: 2.95 MIL/uL — ABNORMAL LOW (ref 3.87–5.11)
RDW: 14.9 % (ref 11.5–15.5)
WBC: 9.5 10*3/uL (ref 4.0–10.5)
nRBC: 0 % (ref 0.0–0.2)

## 2021-04-24 LAB — C-REACTIVE PROTEIN: CRP: 2.2 mg/dL — ABNORMAL HIGH (ref <1.0)

## 2021-04-24 LAB — GLUCOSE, CAPILLARY
Glucose-Capillary: 193 mg/dL — ABNORMAL HIGH (ref 70–99)
Glucose-Capillary: 214 mg/dL — ABNORMAL HIGH (ref 70–99)
Glucose-Capillary: 186 mg/dL — ABNORMAL HIGH (ref 70–99)

## 2021-04-24 LAB — MAGNESIUM: Magnesium: 2.3 mg/dL (ref 1.7–2.4)

## 2021-04-24 LAB — BRAIN NATRIURETIC PEPTIDE: B Natriuretic Peptide: 1219.3 pg/mL — ABNORMAL HIGH (ref 0.0–100.0)

## 2021-04-24 LAB — PROCALCITONIN: Procalcitonin: 1.68 ng/mL

## 2021-04-24 MED ORDER — DILTIAZEM HCL ER COATED BEADS 180 MG PO CP24
180.0000 mg | ORAL_CAPSULE | Freq: Every day | ORAL | Status: DC
  Filled 2021-04-24: qty 1

## 2021-04-24 MED ORDER — DIGOXIN 0.25 MG/ML IJ SOLN
0.2500 mg | Freq: Once | INTRAMUSCULAR | Status: AC
  Administered 2021-04-24: 14:00:00 0.25 mg via INTRAVENOUS
  Filled 2021-04-24: qty 2

## 2021-04-24 MED ORDER — INSULIN LISPRO (1 UNIT DIAL) 100 UNIT/ML (KWIKPEN): PEN_INJECTOR | SUBCUTANEOUS | 0 refills | Status: DC

## 2021-04-24 MED ORDER — DIGOXIN 0.25 MG/ML IJ SOLN
0.2500 mg | Freq: Once | INTRAMUSCULAR | Status: DC
  Filled 2021-04-24: qty 2

## 2021-04-24 NOTE — Progress Notes (Signed)
Attempted x3 to call report to [facility].

## 2021-04-24 NOTE — Progress Notes (Signed)
Occupational Therapy Treatment Patient Details Name: Sheila Morgan MRN: [id number] DOB: 02/24/1933 Today's Date: 04/24/2021   History of present illness 87 y.o. female who returns to the emergency department 04/19/21 with somnolence and low oxygen saturation at her SNF. Acute encephalopathy; respiratiory failure; SIRS;  PMH significant for coronary artery disease status post CABG, type 2 diabetes mellitus, paroxysmal atrial fibrillation on Xarelto, hypothyroidism, history of DVT, CKD 3A, and recent hospital admission (12/3-12/15)   OT comments  Patient awake and eager to participate with therapy. Patient was able to assist with rolling to change pads and assisted with getting to EOB. Patient performed standing from EOB into Stedy with max assist +2 with use of bed pads to assist and patient unable to get to full stand. Patient assisted with getting legs back into bed and was setup for breakfast.  Acute OT to continue to follow.    Recommendations for follow up therapy are one component of a multi-disciplinary discharge planning process, led by the attending physician.  Recommendations may be updated based on patient status, additional functional criteria and insurance authorization.    Follow Up Recommendations  Skilled nursing-short term rehab (<3 hours/day)    Assistance Recommended at Discharge Frequent or constant Supervision/Assistance  Equipment Recommendations  Other (comment)    Recommendations for Other Services      Precautions / Restrictions Precautions Precautions: Fall Precaution Comments: monitor HR, sats stable on RA Restrictions Weight Bearing Restrictions: No       Mobility Bed Mobility Overal bed mobility: Needs Assistance Bed Mobility: Rolling;Sidelying to Sit;Sit to Sidelying Rolling: Min assist   Supine to sit: Min assist;HOB elevated Sit to supine: Max assist   General bed mobility comments: patient participated with rolling in bed and getting to EOB  and supine    Transfers Overall transfer level: Needs assistance   Transfers: Sit to/from Stand Sit to Stand: Max assist;From elevated surface;+2 physical assistance          Lateral/Scoot Transfers: Max assist;+2 physical assistance General transfer comment: stood with stedy from elevated EOB with near full standing x 2 attempts (using pad under hips) and 2 attempts with buttocks cleared off mattress, but did not achieve upright posture.     Balance Overall balance assessment: Needs assistance Sitting-balance support: No upper extremity supported;Feet supported Sitting balance-Leahy Scale: Fair Sitting balance - Comments: Good static, fair dynamic   Standing balance support: Bilateral upper extremity supported;Reliant on assistive device for balance Standing balance-Leahy Scale: Zero Standing balance comment: unable to fully stand, reliant on Stedy and therapist                           ADL either performed or assessed with clinical judgement   ADL Overall ADL's : Needs assistance/impaired Eating/Feeding: Set up;Bed level Eating/Feeding Details (indicate cue type and reason): provided setup for breakfast                                        Extremity/Trunk Assessment Upper Extremity Assessment RUE Deficits / Details: Shoulder ROM limited to ~50* AROM. Elbow-->Grip: WFL with hand OA looking deformities. Pt reports she fell "some time ago" and injured her shoulder. RUE Sensation: WNL RUE Coordination: decreased fine motor            Vision       Perception     Praxis  Cognition Arousal/Alertness: Awake/alert Behavior During Therapy: WFL for tasks assessed/performed Overall Cognitive Status: History of cognitive impairments - at baseline Area of Impairment: Memory;Problem solving                     Memory: Decreased short-term memory       Problem Solving: Requires verbal cues;Requires tactile cues;Slow  processing;Decreased initiation General Comments: Patient followed commands with increased time          Exercises General Exercises - Lower Extremity Ankle Circles/Pumps: AROM;Both;10 reps Heel Slides: AROM;Both;10 reps;Supine (resistedd extension)   Shoulder Instructions       General Comments HR ranged 106-143 bpm with transfers    Pertinent Vitals/ Pain       Pain Assessment: Faces Faces Pain Scale: Hurts little more Pain Location: Lt knee Pain Descriptors / Indicators: Aching;Grimacing Pain Intervention(s): Limited activity within patient's tolerance;Monitored during session;Repositioned  Home Living                                          Prior Functioning/Environment              Frequency  Min 2X/week        Progress Toward Goals  OT Goals(current goals can now be found in the care plan section)  Progress towards OT goals: Progressing toward goals  Acute Rehab OT Goals Patient Stated Goal: get better OT Goal Formulation: With patient Time For Goal Achievement: 04/22/21 Potential to Achieve Goals: Good ADL Goals Pt Will Perform Grooming: with min guard assist;standing Pt Will Perform Upper Body Bathing: with set-up;sitting Pt Will Perform Lower Body Dressing: with min assist;sit to/from stand Pt Will Transfer to Toilet: with min assist;stand pivot transfer Pt Will Perform Toileting - Clothing Manipulation and hygiene: with supervision;with caregiver independent in assisting;with adaptive equipment Additional ADL Goal #1: Pt will perform any 2 out of bed ADLs with RPE no greater than 5/10 to indicate improved activity tolerance. Additional ADL Goal #2: Patient Regis Bill will identify at least 3 energy conservation strategies to employ at home in order to maximize function and quality of life and decrease caregiver burden while preventing exacerbation of symptoms and rehospitalization.  Plan Discharge plan remains appropriate     Co-evaluation    PT/OT/SLP Co-Evaluation/Treatment: Yes Reason for Co-Treatment: Complexity of the patient's impairments (multi-system involvement);For patient/therapist safety;To address functional/ADL transfers   OT goals addressed during session: Strengthening/ROM      AM-PAC OT "6 Clicks" Daily Activity     Outcome Measure   Help from another person eating meals?: None Help from another person taking care of personal grooming?: A Little Help from another person toileting, which includes using toliet, bedpan, or urinal?: Total Help from another person bathing (including washing, rinsing, drying)?: A Lot Help from another person to put on and taking off regular upper body clothing?: A Little Help from another person to put on and taking off regular lower body clothing?: A Lot 6 Click Score: 15    End of Session Equipment Utilized During Treatment: Gait belt;Other (comment) Charlaine Dalton)  OT Visit Diagnosis: Unsteadiness on feet (R26.81);Other symptoms and signs involving cognitive function;History of falling (Z91.81);Muscle weakness (generalized) (M62.81)   Activity Tolerance Patient tolerated treatment well   Patient Left in bed;with call bell/phone within reach;with bed alarm set   Nurse Communication Mobility status        Time: [phone] OT  Time Calculation (min): 24 min  Charges: OT General Charges $OT Visit: 1 Visit OT Treatments $Therapeutic Activity: 8-22 mins  Lodema Hong, [place]  Pager [phone] Office [place] 04/24/2021, 11:32 AM

## 2021-04-24 NOTE — Discharge Instructions (Signed)
Follow with Primary MD Binnie Rail, MD in 7 days   Get CBC, CMP, 2 view Chest X ray -  checked next visit within 1 week by SNF MD   Activity: As tolerated with Full fall precautions use walker/cane & assistance as needed  Disposition SNF  Diet: Dysphagia 2 diet with feeding assistance and aspiration precautions  Special Instructions: If you have smoked or chewed Tobacco  in the last 2 yrs please stop smoking, stop any regular Alcohol  and or any Recreational drug use.  On your next visit with your primary care physician please Get Medicines reviewed and adjusted.  Please request your Prim.MD to go over all Hospital Tests and Procedure/Radiological results at the follow up, please get all Hospital records sent to your Prim MD by signing hospital release before you go home.  If you experience worsening of your admission symptoms, develop shortness of breath, life threatening emergency, suicidal or homicidal thoughts you must seek medical attention immediately by calling 911 or calling your MD immediately  if symptoms less severe.  You Must read complete instructions/literature along with all the possible adverse reactions/side effects for all the Medicines you take and that have been prescribed to you. Take any new Medicines after you have completely understood and accpet all the possible adverse reactions/side effects.

## 2021-04-24 NOTE — TOC Transition Note (Signed)
Transition of Care [facility]) - CM/SW Discharge Note   Patient Details  Name: ALEKSIS JIGGETTS MRN: [id number] Date of Birth: 01/27/1933  Transition [facility] CM/SW Contact:  Benard Halsted, LCSW Phone Number: 04/24/2021, 11:27 AM   Clinical Narrative:    Patient will DC to: [place] date: 04/24/21 Family notified: Daughter Transport by: Sherryll Burger   Per MD patient ready for DC to [place]. RN to call report prior to discharge [phone] room 705). RN, patient, patient's family, and facility notified of DC. Discharge Summary and FL2 sent to facility. DC packet on chart. Ambulance transport requested for patient.   CSW will sign off for now as social work intervention is no longer needed. Please consult [place] again if new needs arise.     Final next level of care: Skilled Nursing Facility Barriers to Discharge: Barriers Resolved   Patient Goals and CMS Choice Patient states their goals for this hospitalization and ongoing recovery are:: Rehab CMS Medicare.gov Compare Post Acute Care list provided to:: Patient Represenative (must comment) Choice offered to / list presented to : Adult Children  Discharge Placement   Existing PASRR number confirmed : 04/24/21          Patient chooses bed at: [facility] Patient to be transferred to facility by: [facility] Name of family member notified: Daughter Patient and family notified of of transfer: 04/24/21  Discharge Plan and Services In-house Referral: Clinical Social Work   Post Acute Care Choice: [place]                               Social Determinants of Health (SDOH) Interventions     Readmission Risk Interventions No flowsheet data found.

## 2021-04-24 NOTE — Discharge Summary (Addendum)
Sheila Morgan JEH:[id number] DOB: 29-Jun-1932 DOA: 04/18/2021  PCP: Binnie Rail, MD  Admit date: 04/18/2021  Discharge date: 04/24/2021  Admitted From: SNF   Disposition:  SNF   Recommendations for Outpatient Follow-up:   Follow up with PCP in 1-2 weeks  PCP Please obtain BMP/CBC, 2 view CXR in 1week,  (see Discharge instructions)   PCP Please follow up on the following pending results:    Home Health: None   Equipment/Devices: None  Consultations: None Discharge Condition: Stable    CODE STATUS: Full    Diet Recommendation: Dysphagia 2 diet with feeding assistance and aspiration precautions.    Chief Complaint  Patient presents with   Altered Mental Status     Brief history of present illness from the day of admission and additional interim summary    Sheila Morgan is a pleasant 87 y.o. female with medical history significant for coronary artery disease status post CABG, type 2 diabetes mellitus, paroxysmal atrial fibrillation on Xarelto, hypothyroidism, history of DVT, CKD 3A, and recent hospital admission who returns to the emergency department with somnolence and low oxygen saturation at her SNF.                                                                   Hospital Course    Acute metabolic encephalopathy due to acute on chronic hypoxic respiratory failure caused by CHF - this is acute on chronic diastolic CHF EF 37%, precipitated by RVR, resolved after Lasix, has been adequately diuresed now oxygenation and mentation back to baseline, currently asymptomatic on room air, may require as needed 1 to 2 L of oxygen at SNF continue to monitor.  CT head unremarkable, no focal deficits, now close to baseline discharge back to SNF.   2.  Chronic A. fib in RVR.  Mali vas 2 score of greater than 3.   Continue beta-blocker, Cardizem dose increased for better rate control, digoxin 2 doses on 04/20/2021, resumed Xarelto for anticoagulation.   3.  COVID-19 infection.  Initially diagnosed on 04/05/2021.  CT chest unremarkable with no infiltrate, CRP was quite elevated hence given a trial of IV Decadron for a few days with much improvement and normalization of her inflammatory markers, now close to baseline with no evidence of active infection clinically.   4.  Hypothyroidism on Synthroid with stable TSH.   5.  AKI On CKD 3A.  Baseline creatinine around 1.3.  AKI almost resolved close to baseline now.   6.  Dyslipidemia.  On statin.   7. CAD s/p CABG.  No acute issues.  On beta-blocker and statin for secondary prevention along with Xarelto instead of aspirin.   8. HTN - BP stable medications adjusted now on home dose beta-blocker and Cardizem.   9. DM Type II.  Continue home dose Glucophage and sliding scale before every meal.   Discharge diagnosis     Principal Problem:   Acute encephalopathy Active Problems:   Normocytic anemia   Coronary artery disease   DM (diabetes mellitus) type II, controlled, with peripheral vascular disorder (HCC)   Hypothyroidism   Paroxysmal atrial fibrillation (HCC)   Acute respiratory failure with hypoxia (HCC)   Acute renal failure superimposed on stage 3a chronic kidney disease (HCC)   Coagulopathy (HCC)   SIRS (systemic inflammatory response syndrome) ([place])    Discharge instructions    Discharge Instructions     Discharge instructions   Complete by: As directed    Follow with Primary MD Binnie Rail, MD in 7 days   Get CBC, CMP, 2 view Chest X ray -  checked next visit within 1 week by SNF MD   Activity: As tolerated with Full fall precautions use walker/cane & assistance as needed  Disposition SNF  Diet: Dysphagia 2 diet with feeding assistance and aspiration precautions  Special Instructions: If you have smoked or chewed Tobacco  in  the last 2 yrs please stop smoking, stop any regular Alcohol  and or any Recreational drug use.  On your next visit with your primary care physician please Get Medicines reviewed and adjusted.  Please request your Prim.MD to go over all Hospital Tests and Procedure/Radiological results at the follow up, please get all Hospital records sent to your Prim MD by signing hospital release before you go home.  If you experience worsening of your admission symptoms, develop shortness of breath, life threatening emergency, suicidal or homicidal thoughts you must seek medical attention immediately by calling 911 or calling your MD immediately  if symptoms less severe.  You Must read complete instructions/literature along with all the possible adverse reactions/side effects for all the Medicines you take and that have been prescribed to you. Take any new Medicines after you have completely understood and accpet all the possible adverse reactions/side effects.   Discharge wound care:   Complete by: As directed    Clean the buttocks with no rinse cleanser. Pat dry and apply a generous coat of Gerhardt's butt cream twice daily or PRN soiling.   Increase activity slowly   Complete by: As directed        Discharge Medications   Allergies as of 04/24/2021   No Known Allergies      Medication List     STOP taking these medications    diltiazem 120 MG 24 hr capsule Commonly known as: CARDIZEM CD       TAKE these medications    acetaminophen 500 MG tablet Commonly known as: TYLENOL Take 500 mg by mouth every 6 (six) hours as needed for moderate pain.   albuterol 108 (90 Base) MCG/ACT inhaler Commonly known as: VENTOLIN HFA Inhale 2 puffs into the lungs every 6 (six) hours as needed for wheezing or shortness of breath. What changed: when to take this   alendronate 70 MG tablet Commonly known as: FOSAMAX Take 70 mg by mouth every Saturday. Take with a full glass of water on an empty  stomach.   CALCIUM 600-D PO Take 1 tablet by mouth 2 (two) times daily.   carvedilol 25 MG tablet Commonly known as: COREG Take 1 tablet (25 mg total) by mouth 2 (two) times daily with a meal.   colchicine 0.6 MG tablet Take 0.6 mg by mouth every other day.   diltiazem 120 MG tablet Commonly known  as: CARDIZEM Take 120 mg by mouth daily.   Ensure Max Protein Liqd Take 330 mLs (11 oz total) by mouth daily for 7 days. What changed: Another medication with the same name was removed. Continue taking this medication, and follow the directions you see here.   insulin lispro 100 UNIT/ML KwikPen Commonly known as: HumaLOG KwikPen Before each meal 3 times a day, 140-199 - 2 units, 200-250 - 4 units, 251-299 - 6 units,  300-349 - 8 units,  350 or above 10 units.  Insulin PEN if approved, provide syringes and needles if needed.   levothyroxine 50 MCG tablet Commonly known as: SYNTHROID TAKE 1 TABLET(50 MCG) BY MOUTH DAILY What changed: See the new instructions.   metFORMIN 500 MG tablet Commonly known as: GLUCOPHAGE TAKE 1/2 TABLET(250 MG) BY MOUTH DAILY WITH LARGEST MEAL What changed: See the new instructions.   MULTIPLE VITAMIN PO Take 1 tablet by mouth daily.   OneTouch Delica Plus [id number] Misc USE AS DIRECTED TO TEST BLOOD SUGAR TWICE DAILY   glucose blood test strip OneTouch Ultra Test strips  USE TO TEST BLOOD SUGAR TWICE DAILY   OneTouch Ultra test strip Generic drug: glucose blood USE TO TEST BLOOD SUGAR TWICE DAILY   Rivaroxaban 15 MG Tabs tablet Commonly known as: XARELTO Take 1 tablet (15 mg total) by mouth daily with supper.   simvastatin 40 MG tablet Commonly known as: ZOCOR TAKE 1 TABLET BY MOUTH IN THE EVENING What changed:  how much to take how to take this when to take this additional instructions               Discharge Care Instructions  (From admission, onward)           Start     Ordered   04/24/21 0000  Discharge wound care:        Comments: Clean the buttocks with no rinse cleanser. Pat dry and apply a generous coat of Gerhardt's butt cream twice daily or PRN soiling.   04/24/21 1023             Follow-up Information     Burns, Claudina Lick, MD. Schedule an appointment as soon as possible for a visit in 1 week(s).   Specialty: Internal Medicine Contact information: [place] [address] [phone]         Lorretta Harp, MD. Schedule an appointment as soon as possible for a visit in 1 week(s).   Specialties: Cardiology, Radiology Contact information: [address] [place] [address] [phone]                 Major procedures and Radiology Reports - PLEASE review detailed and final reports thoroughly  -      DG Chest 1 View  Result Date: 04/06/2021 CLINICAL DATA:  Hypoxia EXAM: CHEST  1 VIEW COMPARISON:  04/05/2021 FINDINGS: Lungs are essentially clear. Biapical pleural-parenchymal scarring. No pleural effusion or pneumothorax. The heart is normal in size. Postsurgical changes related to prior CABG. Degenerative changes of the bilateral shoulders. IMPRESSION: No evidence of acute cardiopulmonary disease. Electronically Signed   By: Julian Hy M.D.   On: 04/06/2021 20:39   CT HEAD WO CONTRAST (5MM)  Result Date: 04/19/2021 CLINICAL DATA:  87 year old female with history of mental status change. EXAM: CT HEAD WITHOUT CONTRAST TECHNIQUE: Contiguous axial images were obtained from the base of the skull through the vertex without intravenous contrast. COMPARISON:  Head CT 04/05/2021. FINDINGS: Brain: Mild  cerebral atrophy. Patchy and confluent areas of decreased attenuation are noted throughout the deep and periventricular white matter of the cerebral hemispheres bilaterally, compatible with chronic microvascular ischemic disease. No evidence of acute infarction, hemorrhage, hydrocephalus, extra-axial collection or mass lesion/mass effect. Vascular: No  hyperdense vessel or unexpected calcification. Skull: Normal. Negative for fracture or focal lesion. Sinuses/Orbits: No acute finding. Other: None. IMPRESSION: 1. No acute intracranial abnormalities. 2. Mild cerebral atrophy with extensive chronic microvascular ischemic changes in the cerebral white matter redemonstrated, as above. Electronically Signed   By: Vinnie Langton M.D.   On: 04/19/2021 05:10   CT Head Wo Contrast  Result Date: 04/05/2021 CLINICAL DATA:  Altered mental status EXAM: CT HEAD WITHOUT CONTRAST TECHNIQUE: Contiguous axial images were obtained from the base of the skull through the vertex without intravenous contrast. COMPARISON:  06/15/2018 FINDINGS: Brain: There are no signs of bleeding within the cranium. Ventricles are not dilated. Cortical sulci are prominent. There is decreased density in the subcortical and periventricular white matter. Vascular: Scattered arterial calcifications are seen. Skull: Unremarkable. Sinuses/Orbits: Unremarkable. Other: There is increased amount of CSF insula suggesting partial empty sella. IMPRESSION: No acute intracranial findings are seen in noncontrast CT brain. Atrophy. Small-vessel disease. Electronically Signed   By: Elmer Picker M.D.   On: 04/05/2021 14:56   CT Chest Wo Contrast  Result Date: 04/19/2021 CLINICAL DATA:  Pneumonia, complication suspected, xray done. Hypoxia. EXAM: CT CHEST WITHOUT CONTRAST TECHNIQUE: Multidetector CT imaging of the chest was performed following the standard protocol without IV contrast. COMPARISON:  None. FINDINGS: Cardiovascular: Extensive multi-vessel coronary artery calcification. Coronary artery bypass grafting has been performed. Global cardiac size is within normal limits. No pericardial effusion. Central pulmonary arteries are of normal caliber. Moderate atherosclerotic calcification within the thoracic aorta. No aortic aneurysm. Mediastinum/Nodes: No enlarged mediastinal or axillary lymph nodes.  Thyroid gland, trachea, and esophagus demonstrate no significant findings. Lungs/Pleura: Small bilateral pleural effusions are present with associated bibasilar atelectasis, right greater than left. No superimposed focal pulmonary nodules or infiltrates. No pneumothorax. Central airways are widely patent. Upper Abdomen: A lobulated cystic structure is seen within the a splenic hilum intimately associated with the tail the pancreas and the gastric fundus. This is indeterminate and may represent a cystic neoplasm of the pancreas, a pancreatic cyst CIS, gastric diverticulum, or potentially sequela of remote trauma. This is not well characterized on this noncontrast examination. Cholelithiasis noted. Musculoskeletal: Osseous structures are age-appropriate. No acute bone abnormality. IMPRESSION: Small bilateral pleural effusions with associated bibasilar atelectasis. No definite superimposed focal pulmonary infiltrate. Cholelithiasis. Lobulated cystic structure within the splenic hilum, poorly characterized on this examination. Comparison with prior examinations, if available, would be helpful in determining chronicity. If none are available, this would be better assessed with dedicated pancreatic protocol CT or MRI examination, if clinically indicated. Aortic Atherosclerosis (ICD10-I70.0). Electronically Signed   By: Fidela Salisbury M.D.   On: 04/19/2021 01:00   DG Chest Port 1 View  Result Date: 04/21/2021 CLINICAL DATA:  Shortness of breath. EXAM: PORTABLE CHEST 1 VIEW COMPARISON:  Portable chest yesterday at 8:51 a.m. FINDINGS: Slightly increased small to moderate-sized pleural effusions right greater than left with continued opacities of the lower lung fields consistent with atelectasis or consolidation. The left mid and both upper lung fields remain clear. There is cardiomegaly, CABG changes and mild central vascular distension increasing as well as slight central edema. The aorta is tortuous and ectatic with  calcifications. Osteopenia. IMPRESSION: 1. Increased perihilar vascular congestion and slight  central edema. 2. Slightly increased small or moderate sized pleural effusions right greater than left, with persistent overlying opacities. 3. Aortic atherosclerosis. Electronically Signed   By: Telford Nab M.D.   On: 04/21/2021 05:45   DG Chest Port 1 View  Result Date: 04/20/2021 CLINICAL DATA:  Shortness of breath EXAM: PORTABLE CHEST 1 VIEW COMPARISON:  CT chest performed on the same date. FINDINGS: The heart is enlarged. Evidence of prior coronary artery bypass grafting. Small bilateral pleural effusions with bibasilar atelectasis. IMPRESSION: 1.  Cardiomegaly. 2.  Small bilateral pleural effusions with bibasilar atelectasis. Electronically Signed   By: Keane Police D.O.   On: 04/20/2021 09:08   DG Chest Port 1 View  Result Date: 04/18/2021 CLINICAL DATA:  Hypoxia and decreased mental alertness. Possible sepsis. EXAM: PORTABLE CHEST 1 VIEW COMPARISON:  Portable chest 04/11/2021. FINDINGS: The heart is enlarged. The aorta is tortuous, ectatic and calcified. Old CABG change. Central vessels are normal in caliber. There are small layering pleural effusions with overlying haziness in the lower lung fields consistent with atelectasis or pneumonia. The mid and upper lungs clear with COPD change. Overall aeration is not significantly changed. No new abnormality. Osteopenia. IMPRESSION: Small pleural effusions with bibasilar atelectasis or consolidation. Cardiomegaly. No new abnormality or worsening. Electronically Signed   By: Telford Nab M.D.   On: 04/18/2021 23:39   DG Chest Port 1 View  Result Date: 04/11/2021 CLINICAL DATA:  Ago cytosis. EXAM: PORTABLE CHEST 1 VIEW COMPARISON:  04/06/2021 FINDINGS: 0803 hours. The cardio pericardial silhouette is enlarged. Bibasilar atelectasis/infiltrate with small pleural effusions noted. No pulmonary edema. Bones are diffusely demineralized. Telemetry leads overlie  the chest. IMPRESSION: Bibasilar atelectasis/infiltrate with small pleural effusions. Electronically Signed   By: Misty Stanley M.D.   On: 04/11/2021 08:37   DG Chest Port 1 View  Result Date: 04/05/2021 CLINICAL DATA:  Sepsis.  Altered mental status. EXAM: PORTABLE CHEST 1 VIEW COMPARISON:  07/14/2018 FINDINGS: Prior CABG. Atherosclerotic calcification of the aortic arch. Stable double contour of the left hemidiaphragm attributable to scarring and/or calcification along the diaphragm. The lung apices are partially obscured by the patient's facial tissues. The lungs appear otherwise clear. Upper normal heart size. Degenerative glenohumeral arthropathy bilaterally. IMPRESSION: 1. Stable scarring and/or calcification along the left hemidiaphragm. 2. Aortic Atherosclerosis (ICD10-I70.0). Borderline enlargement of the cardiopericardial silhouette. Electronically Signed   By: Van Clines M.D.   On: 04/05/2021 12:57   ECHOCARDIOGRAM COMPLETE  Result Date: 04/19/2021    ECHOCARDIOGRAM REPORT   Patient Name:   KWEEN BACORN Date of Exam: 04/19/2021 Medical Rec #:  [id number]       Height:       64.0 in Accession #:    [id number]      Weight:       151.9 lb Date of Birth:  01/25/1933       BSA:          1.740 m Patient Age:    16 years        BP:           145/93 mmHg Patient Gender: F               HR:           93 bpm. Exam Location:  Inpatient Procedure: 2D Echo, Cardiac Doppler and Color Doppler Indications:    Atrial fibrillation  History:        Patient has prior history of Echocardiogram examinations, most  recent 12/27/2020. Previous Myocardial Infarction and CAD,                 Arrythmias:Atrial Fibrillation; Risk Factors:Hypertension and                 Dyslipidemia.  Sonographer:    Merrie Roof RDCS Referring Phys: [id number] [place]  1. Left ventricular ejection fraction, by estimation, is 50%. The left ventricle has mildly decreased function. The left ventricle  demonstrates global hypokinesis. There is mild concentric left ventricular hypertrophy. Left ventricular diastolic parameters are indeterminate.  2. Right ventricular systolic function was not well visualized. The right ventricular size is normal. There is mildly elevated pulmonary artery systolic pressure. The estimated right ventricular systolic pressure is 45.4 mmHg.  3. Left atrial size was moderately dilated.  4. The mitral valve is degenerative. Mild mitral valve regurgitation.  5. Tricuspid valve regurgitation is moderate.  6. The aortic valve is tricuspid. There is mild calcification of the aortic valve. There is mild thickening of the aortic valve. Aortic valve regurgitation is not visualized. Aortic valve sclerosis is present, with no evidence of aortic valve stenosis. Comparison(s): A prior study was performed on 12/27/20. In AF RVR, LV appears decrease and mitral and tricuspid valve disease appear worse. FINDINGS  Left Ventricle: Left ventricular ejection fraction, by estimation, is 50%. The left ventricle has mildly decreased function. The left ventricle demonstrates global hypokinesis. The left ventricular internal cavity size was normal in size. There is mild concentric left ventricular hypertrophy. Left ventricular diastolic parameters are indeterminate. Right Ventricle: The right ventricular size is normal. No increase in right ventricular wall thickness. Right ventricular systolic function was not well visualized. There is mildly elevated pulmonary artery systolic pressure. The tricuspid regurgitant velocity is 3.14 m/s, and with an assumed right atrial pressure of 3 mmHg, the estimated right ventricular systolic pressure is 09.8 mmHg. Left Atrium: Left atrial size was moderately dilated. Right Atrium: Right atrial size was normal in size. Pericardium: There is no evidence of pericardial effusion. Mitral Valve: The mitral valve is degenerative in appearance. Mild to moderate mitral annular  calcification. Mild mitral valve regurgitation. Tricuspid Valve: The tricuspid valve is normal in structure. Tricuspid valve regurgitation is moderate . No evidence of tricuspid stenosis. Aortic Valve: The aortic valve is tricuspid. There is mild calcification of the aortic valve. There is mild thickening of the aortic valve. There is mild aortic valve annular calcification. Aortic valve regurgitation is not visualized. Aortic valve sclerosis is present, with no evidence of aortic valve stenosis. Aortic valve mean gradient measures 5.0 mmHg. Aortic valve peak gradient measures 9.5 mmHg. Aortic valve area, by VTI measures 1.87 cm. Pulmonic Valve: The pulmonic valve was not well visualized. Pulmonic valve regurgitation is not visualized. No evidence of pulmonic stenosis. Aorta: The aortic root and ascending aorta are structurally normal, with no evidence of dilitation. IAS/Shunts: The atrial septum is grossly normal.  LEFT VENTRICLE PLAX 2D LVIDd:         3.80 cm LVIDs:         2.90 cm LV PW:         1.10 cm LV IVS:        1.00 cm LVOT diam:     1.80 cm LV SV:         38 LV SV Index:   22 LVOT Area:     2.54 cm  RIGHT VENTRICLE RV S prime:     7.95 cm/s LEFT ATRIUM  Index LA diam:        3.70 cm 2.13 cm/m LA Vol (A2C):   68.9 ml 39.59 ml/m LA Vol (A4C):   72.3 ml 41.54 ml/m LA Biplane Vol: 72.7 ml 41.77 ml/m  AORTIC VALVE AV Area (Vmax):    1.47 cm AV Area (Vmean):   1.30 cm AV Area (VTI):     1.87 cm AV Vmax:           154.00 cm/s AV Vmean:          103.000 cm/s AV VTI:            0.205 m AV Peak Grad:      9.5 mmHg AV Mean Grad:      5.0 mmHg LVOT Vmax:         89.20 cm/s LVOT Vmean:        52.500 cm/s LVOT VTI:          0.151 m LVOT/AV VTI ratio: 0.74  AORTA Ao Root diam: 2.70 cm Ao Asc diam:  3.00 cm MR Peak grad:    107.7 mmHg   TRICUSPID VALVE MR Mean grad:    72.0 mmHg    TR Peak grad:   39.4 mmHg MR Vmax:         519.00 cm/s  TR Vmax:        314.00 cm/s MR Vmean:        408.0 cm/s MR PISA:          0.57 cm     SHUNTS MR PISA Eff ROA: 3 mm        Systemic VTI:  0.15 m MR PISA Radius:  0.30 cm      Systemic Diam: 1.80 cm Rudean Haskell MD Electronically signed by Rudean Haskell MD Signature Date/Time: 04/19/2021/4:04:51 PM    Final      Today   Subjective    [name] today has no headache,no chest abdominal pain,no new weakness tingling or numbness, feels much better .   Objective   Blood pressure (!) 151/79, pulse 71, temperature 97.6 F (36.4 C), temperature source Axillary, resp. rate 20, height 5\' 6"  (1.676 m), weight 70 kg, SpO2 97 %.   Intake/Output Summary (Last 24 hours) at 04/24/2021 1023 Last data filed at 04/23/2021 2258 Gross per 24 hour  Intake 240 ml  Output --  Net 240 ml    Exam  Awake Alert x2, No new F.N deficits,   [place].AT,PERRAL Supple Neck,No JVD, No cervical lymphadenopathy appriciated.  Symmetrical Chest wall movement, Good air movement bilaterally, CTAB RRR,No Gallops,Rubs or new Murmurs, No Parasternal Heave +ve B.Sounds, Abd Soft, Non tender, No organomegaly appriciated, No rebound -guarding or rigidity. No Cyanosis, Clubbing or edema, No new Rash or bruise   Data Review   CBC w Diff:  Lab Results  Component Value Date   WBC 9.5 04/24/2021   HGB 9.0 (L) 04/24/2021   HGB 14.3 12/10/2020   HCT 27.6 (L) 04/24/2021   HCT 42.8 12/10/2020   PLT 220 04/24/2021   PLT 213 12/10/2020   LYMPHOPCT 4 04/24/2021   MONOPCT 2 04/24/2021   EOSPCT 0 04/24/2021   BASOPCT 0 04/24/2021    CMP:  Lab Results  Component Value Date   NA 134 (L) 04/24/2021   NA 143 12/10/2020   K 4.6 04/24/2021   CL 105 04/24/2021   CO2 23 04/24/2021   BUN 49 (H) 04/24/2021   BUN 12 12/10/2020   CREATININE 1.36 (H) 04/24/2021  CREATININE 1.16 (H) 01/12/2020   PROT 5.3 (L) 04/24/2021   PROT 7.2 06/17/2017   ALBUMIN 1.7 (L) 04/24/2021   ALBUMIN 4.5 06/17/2017   BILITOT 0.5 04/24/2021   BILITOT 0.6 06/17/2017   ALKPHOS 75 04/24/2021    AST 15 04/24/2021   ALT 34 04/24/2021  .   Total Time in preparing paper work, data evaluation and todays exam - 78 minutes  Lala Lund M.D on 04/24/2021 at 10:23 AM  [facility]

## 2021-04-24 NOTE — Progress Notes (Signed)
Physical Therapy Treatment Patient Details Name: Sheila Morgan MRN: [id number] DOB: Nov 14, 1932 Today's Date: 04/24/2021   History of Present Illness 87 y.o. female who returns to the emergency department 04/19/21 with somnolence and low oxygen saturation at her SNF. Acute encephalopathy; respiratiory failure; SIRS;  PMH significant for coronary artery disease status post CABG, type 2 diabetes mellitus, paroxysmal atrial fibrillation on Xarelto, hypothyroidism, history of DVT, CKD 3A, and recent hospital admission (12/3-12/15)    PT Comments    Patient more awake and participatory this date. Rolling with less assistance and side to sit improved. Able to stand multiple times with use of Stedy and +2 max assist.   Recommendations for follow up therapy are one component of a multi-disciplinary discharge planning process, led by the attending physician.  Recommendations may be updated based on patient status, additional functional criteria and insurance authorization.  Follow Up Recommendations  Skilled nursing-short term rehab (<3 hours/day)     Assistance Recommended at Discharge Frequent or constant Supervision/Assistance  Equipment Recommendations  None recommended by PT    Recommendations for Other Services       Precautions / Restrictions Precautions Precautions: Fall Precaution Comments: monitor HR, sats stable on RA     Mobility  Bed Mobility Overal bed mobility: Needs Assistance Bed Mobility: Rolling;Sidelying to Sit;Sit to Sidelying Rolling: Min assist   Supine to sit: Min assist;HOB elevated Sit to supine: Max assist   General bed mobility comments: rolling left and right with rails and incr time, cues; pt assists with moving legs over EOB and pushing up onto Lt elbow; return to supine assist with torso and legs    Transfers Overall transfer level: Needs assistance   Transfers: Sit to/from Stand Sit to Stand: Max assist;From elevated surface;+2 physical  assistance          Lateral/Scoot Transfers: Max assist;+2 physical assistance General transfer comment: stood with stedy from elevated EOB with near full standing x 2 attempts (using pad under hips) and 2 attempts with buttocks cleared off mattress, but did not achieve upright posture.    Ambulation/Gait                   Stairs             Wheelchair Mobility    Modified Rankin (Stroke Patients Only)       Balance Overall balance assessment: Needs assistance Sitting-balance support: No upper extremity supported;Feet supported Sitting balance-Leahy Scale: Fair Sitting balance - Comments: Good static, fair dynamic   Standing balance support: Bilateral upper extremity supported;Reliant on assistive device for balance Standing balance-Leahy Scale: Zero                              Cognition Arousal/Alertness: Awake/alert Behavior During Therapy: WFL for tasks assessed/performed Overall Cognitive Status: History of cognitive impairments - at baseline Area of Impairment: Memory;Problem solving                     Memory: Decreased short-term memory       Problem Solving: Requires verbal cues;Requires tactile cues;Slow processing;Decreased initiation          Exercises General Exercises - Lower Extremity Ankle Circles/Pumps: AROM;Both;10 reps Heel Slides: AROM;Both;10 reps;Supine (resistedd extension)    General Comments General comments (skin integrity, edema, etc.): HR ranged 106-143 bpm with transfers      Pertinent Vitals/Pain Pain Assessment: Faces Faces Pain Scale: Hurts little more Pain  Location: Lt knee Pain Descriptors / Indicators: Aching;Grimacing Pain Intervention(s): Limited activity within patient's tolerance;Monitored during session;Repositioned    Home Living                          Prior Function            PT Goals (current goals can now be found in the care plan section) Acute Rehab PT  Goals Patient Stated Goal: to get better PT Goal Formulation: With patient Time For Goal Achievement: 05/05/21 Potential to Achieve Goals: Good Progress towards PT goals: Progressing toward goals    Frequency    Min 2X/week      PT Plan Current plan remains appropriate    Co-evaluation PT/OT/SLP Co-Evaluation/Treatment: Yes Reason for Co-Treatment: Complexity of the patient's impairments (multi-system involvement);For patient/therapist safety;To address functional/ADL transfers          AM-PAC PT "6 Clicks" Mobility   Outcome Measure  Help needed turning from your back to your side while in a flat bed without using bedrails?: A Little Help needed moving from lying on your back to sitting on the side of a flat bed without using bedrails?: Total Help needed moving to and from a bed to a chair (including a wheelchair)?: Total Help needed standing up from a chair using your arms (e.g., wheelchair or bedside chair)?: Total Help needed to walk in hospital room?: Total Help needed climbing 3-5 steps with a railing? : Total 6 Click Score: 8    End of Session Equipment Utilized During Treatment: Gait belt Activity Tolerance: Patient tolerated treatment well Patient left: with call bell/phone within reach;in bed;with bed alarm set Nurse Communication: Mobility status PT Visit Diagnosis: Muscle weakness (generalized) (M62.81);Difficulty in walking, not elsewhere classified (R26.2)     Time: 1610-9604 PT Time Calculation (min) (ACUTE ONLY): 21 min  Charges:  $Therapeutic Activity: 8-22 mins                      Arby Barrette, PT Acute Rehabilitation Services  Pager [phone] Office [phone]    Rexanne Mano 04/24/2021, 11:04 AM

## 2021-04-25 DIAGNOSIS — D649 Anemia, unspecified: Secondary | ICD-10-CM | POA: Diagnosis not present

## 2021-04-25 DIAGNOSIS — E1159 Type 2 diabetes mellitus with other circulatory complications: Secondary | ICD-10-CM | POA: Diagnosis not present

## 2021-04-25 DIAGNOSIS — R6510 Systemic inflammatory response syndrome (SIRS) of non-infectious origin without acute organ dysfunction: Secondary | ICD-10-CM | POA: Diagnosis not present

## 2021-04-25 DIAGNOSIS — Z9189 Other specified personal risk factors, not elsewhere classified: Secondary | ICD-10-CM | POA: Diagnosis not present

## 2021-04-25 DIAGNOSIS — I2510 Atherosclerotic heart disease of native coronary artery without angina pectoris: Secondary | ICD-10-CM | POA: Diagnosis not present

## 2021-04-25 DIAGNOSIS — D6859 Other primary thrombophilia: Secondary | ICD-10-CM | POA: Diagnosis not present

## 2021-04-25 DIAGNOSIS — I5033 Acute on chronic diastolic (congestive) heart failure: Secondary | ICD-10-CM | POA: Diagnosis not present

## 2021-04-25 DIAGNOSIS — I4891 Unspecified atrial fibrillation: Secondary | ICD-10-CM | POA: Diagnosis not present

## 2021-04-25 DIAGNOSIS — J9601 Acute respiratory failure with hypoxia: Secondary | ICD-10-CM | POA: Diagnosis not present

## 2021-04-25 DIAGNOSIS — U071 COVID-19: Secondary | ICD-10-CM | POA: Diagnosis not present

## 2021-04-25 DIAGNOSIS — I110 Hypertensive heart disease with heart failure: Secondary | ICD-10-CM | POA: Diagnosis not present

## 2021-04-25 DIAGNOSIS — N179 Acute kidney failure, unspecified: Secondary | ICD-10-CM | POA: Diagnosis not present

## 2021-04-25 DIAGNOSIS — R5381 Other malaise: Secondary | ICD-10-CM | POA: Diagnosis not present

## 2021-04-25 DIAGNOSIS — N183 Chronic kidney disease, stage 3 unspecified: Secondary | ICD-10-CM | POA: Diagnosis not present

## 2021-04-25 DIAGNOSIS — I152 Hypertension secondary to endocrine disorders: Secondary | ICD-10-CM | POA: Diagnosis not present

## 2021-04-25 DIAGNOSIS — M533 Sacrococcygeal disorders, not elsewhere classified: Secondary | ICD-10-CM | POA: Diagnosis not present

## 2021-05-01 DIAGNOSIS — L89150 Pressure ulcer of sacral region, unstageable: Secondary | ICD-10-CM | POA: Diagnosis not present

## 2021-05-02 DIAGNOSIS — L89153 Pressure ulcer of sacral region, stage 3: Secondary | ICD-10-CM | POA: Diagnosis not present

## 2021-05-02 DIAGNOSIS — E790 Hyperuricemia without signs of inflammatory arthritis and tophaceous disease: Secondary | ICD-10-CM | POA: Diagnosis not present

## 2021-05-02 DIAGNOSIS — D649 Anemia, unspecified: Secondary | ICD-10-CM | POA: Diagnosis not present

## 2021-05-06 DIAGNOSIS — G9340 Encephalopathy, unspecified: Secondary | ICD-10-CM | POA: Diagnosis not present

## 2021-05-06 DIAGNOSIS — R9389 Abnormal findings on diagnostic imaging of other specified body structures: Secondary | ICD-10-CM | POA: Diagnosis not present

## 2021-05-06 DIAGNOSIS — I5033 Acute on chronic diastolic (congestive) heart failure: Secondary | ICD-10-CM | POA: Diagnosis not present

## 2021-05-07 ENCOUNTER — Other Ambulatory Visit: Payer: Self-pay | Admitting: *Deleted

## 2021-05-07 DIAGNOSIS — M6281 Muscle weakness (generalized): Secondary | ICD-10-CM | POA: Diagnosis not present

## 2021-05-07 DIAGNOSIS — E46 Unspecified protein-calorie malnutrition: Secondary | ICD-10-CM | POA: Diagnosis not present

## 2021-05-07 DIAGNOSIS — G9341 Metabolic encephalopathy: Secondary | ICD-10-CM | POA: Diagnosis not present

## 2021-05-07 DIAGNOSIS — L89159 Pressure ulcer of sacral region, unspecified stage: Secondary | ICD-10-CM | POA: Diagnosis not present

## 2021-05-07 DIAGNOSIS — R2681 Unsteadiness on feet: Secondary | ICD-10-CM | POA: Diagnosis not present

## 2021-05-07 DIAGNOSIS — E119 Type 2 diabetes mellitus without complications: Secondary | ICD-10-CM | POA: Diagnosis not present

## 2021-05-07 NOTE — Patient Outreach (Signed)
Per [place] eligible member resides in [facility].  Screened for potential [facility] Care Management services.   Communication sent to facility SW to inquire about transition plans.  Will collaborate with facility SW and follow up with resident as appropriate.  Will continue to follow for potential [facility] needs and transition plans while member resides in SNF.    Marthenia Rolling, MSN, RN,BSN [place] Acute Care Coordinator [phone] [facility]) [phone]  (Toll free office)

## 2021-05-09 ENCOUNTER — Emergency Department (HOSPITAL_COMMUNITY): Payer: Medicare Other

## 2021-05-09 ENCOUNTER — Inpatient Hospital Stay (HOSPITAL_COMMUNITY)
Admission: EM | Admit: 2021-05-09 | Discharge: 2021-05-24 | DRG: 291 | Disposition: A | Discharge status: Hospice | Payer: Medicare Other | Attending: Internal Medicine | Admitting: Internal Medicine

## 2021-05-09 ENCOUNTER — Encounter (HOSPITAL_COMMUNITY): Payer: Self-pay

## 2021-05-09 ENCOUNTER — Other Ambulatory Visit: Payer: Self-pay

## 2021-05-09 DIAGNOSIS — I482 Chronic atrial fibrillation, unspecified: Secondary | ICD-10-CM

## 2021-05-09 DIAGNOSIS — E440 Moderate protein-calorie malnutrition: Secondary | ICD-10-CM | POA: Diagnosis not present

## 2021-05-09 DIAGNOSIS — I5021 Acute systolic (congestive) heart failure: Secondary | ICD-10-CM | POA: Diagnosis not present

## 2021-05-09 DIAGNOSIS — R0609 Other forms of dyspnea: Secondary | ICD-10-CM | POA: Diagnosis not present

## 2021-05-09 DIAGNOSIS — J90 Pleural effusion, not elsewhere classified: Secondary | ICD-10-CM | POA: Diagnosis not present

## 2021-05-09 DIAGNOSIS — D631 Anemia in chronic kidney disease: Secondary | ICD-10-CM | POA: Diagnosis present

## 2021-05-09 DIAGNOSIS — F0390 Unspecified dementia without behavioral disturbance: Secondary | ICD-10-CM | POA: Diagnosis not present

## 2021-05-09 DIAGNOSIS — Z833 Family history of diabetes mellitus: Secondary | ICD-10-CM

## 2021-05-09 DIAGNOSIS — L89152 Pressure ulcer of sacral region, stage 2: Secondary | ICD-10-CM | POA: Diagnosis present

## 2021-05-09 DIAGNOSIS — J9601 Acute respiratory failure with hypoxia: Secondary | ICD-10-CM | POA: Diagnosis not present

## 2021-05-09 DIAGNOSIS — M810 Age-related osteoporosis without current pathological fracture: Secondary | ICD-10-CM | POA: Diagnosis present

## 2021-05-09 DIAGNOSIS — I517 Cardiomegaly: Secondary | ICD-10-CM | POA: Diagnosis not present

## 2021-05-09 DIAGNOSIS — I4891 Unspecified atrial fibrillation: Secondary | ICD-10-CM | POA: Diagnosis present

## 2021-05-09 DIAGNOSIS — I272 Pulmonary hypertension, unspecified: Secondary | ICD-10-CM | POA: Diagnosis present

## 2021-05-09 DIAGNOSIS — Z85828 Personal history of other malignant neoplasm of skin: Secondary | ICD-10-CM

## 2021-05-09 DIAGNOSIS — N1831 Chronic kidney disease, stage 3a: Secondary | ICD-10-CM | POA: Diagnosis present

## 2021-05-09 DIAGNOSIS — I509 Heart failure, unspecified: Secondary | ICD-10-CM | POA: Diagnosis not present

## 2021-05-09 DIAGNOSIS — K449 Diaphragmatic hernia without obstruction or gangrene: Secondary | ICD-10-CM | POA: Diagnosis not present

## 2021-05-09 DIAGNOSIS — K921 Melena: Secondary | ICD-10-CM

## 2021-05-09 DIAGNOSIS — Z66 Do not resuscitate: Secondary | ICD-10-CM | POA: Diagnosis not present

## 2021-05-09 DIAGNOSIS — J9811 Atelectasis: Secondary | ICD-10-CM | POA: Diagnosis present

## 2021-05-09 DIAGNOSIS — Z803 Family history of malignant neoplasm of breast: Secondary | ICD-10-CM

## 2021-05-09 DIAGNOSIS — I071 Rheumatic tricuspid insufficiency: Secondary | ICD-10-CM | POA: Diagnosis present

## 2021-05-09 DIAGNOSIS — R627 Adult failure to thrive: Secondary | ICD-10-CM | POA: Diagnosis present

## 2021-05-09 DIAGNOSIS — Z8616 Personal history of COVID-19: Secondary | ICD-10-CM

## 2021-05-09 DIAGNOSIS — R001 Bradycardia, unspecified: Secondary | ICD-10-CM | POA: Diagnosis not present

## 2021-05-09 DIAGNOSIS — R404 Transient alteration of awareness: Secondary | ICD-10-CM | POA: Diagnosis not present

## 2021-05-09 DIAGNOSIS — E872 Acidosis, unspecified: Secondary | ICD-10-CM | POA: Diagnosis present

## 2021-05-09 DIAGNOSIS — K2901 Acute gastritis with bleeding: Secondary | ICD-10-CM | POA: Diagnosis not present

## 2021-05-09 DIAGNOSIS — Z7189 Other specified counseling: Secondary | ICD-10-CM | POA: Diagnosis not present

## 2021-05-09 DIAGNOSIS — I5033 Acute on chronic diastolic (congestive) heart failure: Secondary | ICD-10-CM | POA: Diagnosis not present

## 2021-05-09 DIAGNOSIS — E871 Hypo-osmolality and hyponatremia: Secondary | ICD-10-CM | POA: Diagnosis not present

## 2021-05-09 DIAGNOSIS — Z951 Presence of aortocoronary bypass graft: Secondary | ICD-10-CM | POA: Diagnosis not present

## 2021-05-09 DIAGNOSIS — E875 Hyperkalemia: Secondary | ICD-10-CM | POA: Diagnosis not present

## 2021-05-09 DIAGNOSIS — D62 Acute posthemorrhagic anemia: Secondary | ICD-10-CM | POA: Diagnosis not present

## 2021-05-09 DIAGNOSIS — G9341 Metabolic encephalopathy: Secondary | ICD-10-CM | POA: Diagnosis not present

## 2021-05-09 DIAGNOSIS — N183 Chronic kidney disease, stage 3 unspecified: Secondary | ICD-10-CM | POA: Diagnosis not present

## 2021-05-09 DIAGNOSIS — L89153 Pressure ulcer of sacral region, stage 3: Secondary | ICD-10-CM | POA: Diagnosis present

## 2021-05-09 DIAGNOSIS — I2510 Atherosclerotic heart disease of native coronary artery without angina pectoris: Secondary | ICD-10-CM | POA: Diagnosis not present

## 2021-05-09 DIAGNOSIS — N179 Acute kidney failure, unspecified: Secondary | ICD-10-CM | POA: Diagnosis not present

## 2021-05-09 DIAGNOSIS — I130 Hypertensive heart and chronic kidney disease with heart failure and stage 1 through stage 4 chronic kidney disease, or unspecified chronic kidney disease: Principal | ICD-10-CM | POA: Diagnosis present

## 2021-05-09 DIAGNOSIS — Z955 Presence of coronary angioplasty implant and graft: Secondary | ICD-10-CM

## 2021-05-09 DIAGNOSIS — M109 Gout, unspecified: Secondary | ICD-10-CM | POA: Diagnosis present

## 2021-05-09 DIAGNOSIS — F03A18 Unspecified dementia, mild, with other behavioral disturbance: Secondary | ICD-10-CM | POA: Diagnosis present

## 2021-05-09 DIAGNOSIS — E1165 Type 2 diabetes mellitus with hyperglycemia: Secondary | ICD-10-CM | POA: Diagnosis not present

## 2021-05-09 DIAGNOSIS — E1159 Type 2 diabetes mellitus with other circulatory complications: Secondary | ICD-10-CM | POA: Diagnosis not present

## 2021-05-09 DIAGNOSIS — U071 COVID-19: Secondary | ICD-10-CM | POA: Diagnosis not present

## 2021-05-09 DIAGNOSIS — F03918 Unspecified dementia, unspecified severity, with other behavioral disturbance: Secondary | ICD-10-CM | POA: Diagnosis present

## 2021-05-09 DIAGNOSIS — E1151 Type 2 diabetes mellitus with diabetic peripheral angiopathy without gangrene: Secondary | ICD-10-CM | POA: Diagnosis not present

## 2021-05-09 DIAGNOSIS — Z794 Long term (current) use of insulin: Secondary | ICD-10-CM

## 2021-05-09 DIAGNOSIS — I499 Cardiac arrhythmia, unspecified: Secondary | ICD-10-CM | POA: Diagnosis not present

## 2021-05-09 DIAGNOSIS — E8809 Other disorders of plasma-protein metabolism, not elsewhere classified: Secondary | ICD-10-CM | POA: Diagnosis not present

## 2021-05-09 DIAGNOSIS — Z7401 Bed confinement status: Secondary | ICD-10-CM | POA: Diagnosis not present

## 2021-05-09 DIAGNOSIS — R0602 Shortness of breath: Secondary | ICD-10-CM | POA: Diagnosis not present

## 2021-05-09 DIAGNOSIS — Z8249 Family history of ischemic heart disease and other diseases of the circulatory system: Secondary | ICD-10-CM

## 2021-05-09 DIAGNOSIS — Z7984 Long term (current) use of oral hypoglycemic drugs: Secondary | ICD-10-CM

## 2021-05-09 DIAGNOSIS — E119 Type 2 diabetes mellitus without complications: Secondary | ICD-10-CM | POA: Diagnosis not present

## 2021-05-09 DIAGNOSIS — K2970 Gastritis, unspecified, without bleeding: Secondary | ICD-10-CM | POA: Diagnosis not present

## 2021-05-09 DIAGNOSIS — R34 Anuria and oliguria: Secondary | ICD-10-CM | POA: Diagnosis not present

## 2021-05-09 DIAGNOSIS — I959 Hypotension, unspecified: Secondary | ICD-10-CM | POA: Diagnosis not present

## 2021-05-09 DIAGNOSIS — E1122 Type 2 diabetes mellitus with diabetic chronic kidney disease: Secondary | ICD-10-CM | POA: Diagnosis present

## 2021-05-09 DIAGNOSIS — D509 Iron deficiency anemia, unspecified: Secondary | ICD-10-CM | POA: Diagnosis not present

## 2021-05-09 DIAGNOSIS — I480 Paroxysmal atrial fibrillation: Secondary | ICD-10-CM | POA: Diagnosis present

## 2021-05-09 DIAGNOSIS — Z7983 Long term (current) use of bisphosphonates: Secondary | ICD-10-CM

## 2021-05-09 DIAGNOSIS — Z79890 Hormone replacement therapy (postmenopausal): Secondary | ICD-10-CM

## 2021-05-09 DIAGNOSIS — R570 Cardiogenic shock: Secondary | ICD-10-CM | POA: Diagnosis not present

## 2021-05-09 DIAGNOSIS — I252 Old myocardial infarction: Secondary | ICD-10-CM

## 2021-05-09 DIAGNOSIS — K2971 Gastritis, unspecified, with bleeding: Secondary | ICD-10-CM | POA: Diagnosis not present

## 2021-05-09 DIAGNOSIS — Z823 Family history of stroke: Secondary | ICD-10-CM

## 2021-05-09 DIAGNOSIS — R578 Other shock: Secondary | ICD-10-CM | POA: Diagnosis not present

## 2021-05-09 DIAGNOSIS — R601 Generalized edema: Secondary | ICD-10-CM

## 2021-05-09 DIAGNOSIS — R531 Weakness: Secondary | ICD-10-CM | POA: Diagnosis not present

## 2021-05-09 DIAGNOSIS — Z515 Encounter for palliative care: Secondary | ICD-10-CM | POA: Diagnosis not present

## 2021-05-09 DIAGNOSIS — M2550 Pain in unspecified joint: Secondary | ICD-10-CM | POA: Diagnosis not present

## 2021-05-09 DIAGNOSIS — Z96652 Presence of left artificial knee joint: Secondary | ICD-10-CM | POA: Diagnosis present

## 2021-05-09 DIAGNOSIS — I110 Hypertensive heart disease with heart failure: Secondary | ICD-10-CM | POA: Diagnosis not present

## 2021-05-09 DIAGNOSIS — I459 Conduction disorder, unspecified: Secondary | ICD-10-CM | POA: Diagnosis not present

## 2021-05-09 DIAGNOSIS — R4182 Altered mental status, unspecified: Secondary | ICD-10-CM | POA: Diagnosis not present

## 2021-05-09 DIAGNOSIS — E039 Hypothyroidism, unspecified: Secondary | ICD-10-CM | POA: Diagnosis present

## 2021-05-09 DIAGNOSIS — R609 Edema, unspecified: Secondary | ICD-10-CM | POA: Diagnosis not present

## 2021-05-09 DIAGNOSIS — R638 Other symptoms and signs concerning food and fluid intake: Secondary | ICD-10-CM | POA: Diagnosis not present

## 2021-05-09 DIAGNOSIS — R770 Abnormality of albumin: Secondary | ICD-10-CM | POA: Diagnosis not present

## 2021-05-09 DIAGNOSIS — I10 Essential (primary) hypertension: Secondary | ICD-10-CM | POA: Diagnosis not present

## 2021-05-09 DIAGNOSIS — D500 Iron deficiency anemia secondary to blood loss (chronic): Secondary | ICD-10-CM | POA: Diagnosis not present

## 2021-05-09 DIAGNOSIS — L89150 Pressure ulcer of sacral region, unstageable: Secondary | ICD-10-CM | POA: Diagnosis present

## 2021-05-09 DIAGNOSIS — Z6826 Body mass index (BMI) 26.0-26.9, adult: Secondary | ICD-10-CM

## 2021-05-09 DIAGNOSIS — D649 Anemia, unspecified: Secondary | ICD-10-CM | POA: Diagnosis not present

## 2021-05-09 DIAGNOSIS — T380X5A Adverse effect of glucocorticoids and synthetic analogues, initial encounter: Secondary | ICD-10-CM | POA: Diagnosis not present

## 2021-05-09 DIAGNOSIS — Z79899 Other long term (current) drug therapy: Secondary | ICD-10-CM

## 2021-05-09 DIAGNOSIS — Z743 Need for continuous supervision: Secondary | ICD-10-CM | POA: Diagnosis not present

## 2021-05-09 LAB — CBC WITH DIFFERENTIAL/PLATELET
Abs Immature Granulocytes: 0.04 10*3/uL (ref 0.00–0.07)
Basophils Absolute: 0 10*3/uL (ref 0.0–0.1)
Basophils Relative: 1 %
Eosinophils Absolute: 0.1 10*3/uL (ref 0.0–0.5)
Eosinophils Relative: 1 %
HCT: 30.4 % — ABNORMAL LOW (ref 36.0–46.0)
Hemoglobin: 8.9 g/dL — ABNORMAL LOW (ref 12.0–15.0)
Immature Granulocytes: 1 %
Lymphocytes Relative: 22 %
Lymphs Abs: 1.3 10*3/uL (ref 0.7–4.0)
MCH: 29.7 pg (ref 26.0–34.0)
MCHC: 29.3 g/dL — ABNORMAL LOW (ref 30.0–36.0)
MCV: 101.3 fL — ABNORMAL HIGH (ref 80.0–100.0)
Monocytes Absolute: 0.5 10*3/uL (ref 0.1–1.0)
Monocytes Relative: 9 %
Neutro Abs: 3.9 10*3/uL (ref 1.7–7.7)
Neutrophils Relative %: 66 %
Platelets: UNDETERMINED 10*3/uL (ref 150–400)
RBC: 3 MIL/uL — ABNORMAL LOW (ref 3.87–5.11)
RDW: 17.2 % — ABNORMAL HIGH (ref 11.5–15.5)
WBC: 5.8 10*3/uL (ref 4.0–10.5)
nRBC: 0 % (ref 0.0–0.2)

## 2021-05-09 LAB — COMPREHENSIVE METABOLIC PANEL
ALT: 21 U/L (ref 0–44)
AST: 26 U/L (ref 15–41)
Albumin: 1.8 g/dL — ABNORMAL LOW (ref 3.5–5.0)
Alkaline Phosphatase: 127 U/L — ABNORMAL HIGH (ref 38–126)
Anion gap: 10 (ref 5–15)
BUN: 12 mg/dL (ref 8–23)
CO2: 19 mmol/L — ABNORMAL LOW (ref 22–32)
Calcium: 8.3 mg/dL — ABNORMAL LOW (ref 8.9–10.3)
Chloride: 106 mmol/L (ref 98–111)
Creatinine, Ser: 1.28 mg/dL — ABNORMAL HIGH (ref 0.44–1.00)
GFR, Estimated: 41 mL/min — ABNORMAL LOW (ref >60)
Glucose, Bld: 106 mg/dL — ABNORMAL HIGH (ref 70–99)
Potassium: 4.1 mmol/L (ref 3.5–5.1)
Sodium: 135 mmol/L (ref 135–145)
Total Bilirubin: 1 mg/dL (ref 0.3–1.2)
Total Protein: 4.7 g/dL — ABNORMAL LOW (ref 6.5–8.1)

## 2021-05-09 LAB — URINALYSIS, ROUTINE W REFLEX MICROSCOPIC
Bilirubin Urine: NEGATIVE
Glucose, UA: NEGATIVE mg/dL
Hgb urine dipstick: NEGATIVE
Ketones, ur: NEGATIVE mg/dL
Leukocytes,Ua: NEGATIVE
Nitrite: NEGATIVE
Protein, ur: NEGATIVE mg/dL
Specific Gravity, Urine: 1.015 (ref 1.005–1.030)
pH: 5.5 (ref 5.0–8.0)

## 2021-05-09 LAB — I-STAT VENOUS BLOOD GAS, ED
Acid-base deficit: 1 mmol/L (ref 0.0–2.0)
Bicarbonate: 21.2 mmol/L (ref 20.0–28.0)
Calcium, Ion: 1.08 mmol/L — ABNORMAL LOW (ref 1.15–1.40)
HCT: 28 % — ABNORMAL LOW (ref 36.0–46.0)
Hemoglobin: 9.5 g/dL — ABNORMAL LOW (ref 12.0–15.0)
O2 Saturation: 95 %
Potassium: 3.9 mmol/L (ref 3.5–5.1)
Sodium: 137 mmol/L (ref 135–145)
TCO2: 22 mmol/L (ref 22–32)
pCO2, Ven: 27.1 mmHg — ABNORMAL LOW (ref 44.0–60.0)
pH, Ven: 7.502 — ABNORMAL HIGH (ref 7.250–7.430)
pO2, Ven: 65 mmHg — ABNORMAL HIGH (ref 32.0–45.0)

## 2021-05-09 LAB — TROPONIN I (HIGH SENSITIVITY)
Troponin I (High Sensitivity): 23 ng/L — ABNORMAL HIGH (ref <18)
Troponin I (High Sensitivity): 25 ng/L — ABNORMAL HIGH (ref <18)

## 2021-05-09 LAB — BRAIN NATRIURETIC PEPTIDE: B Natriuretic Peptide: 722.2 pg/mL — ABNORMAL HIGH (ref 0.0–100.0)

## 2021-05-09 LAB — TSH: TSH: 4.846 u[IU]/mL — ABNORMAL HIGH (ref 0.350–4.500)

## 2021-05-09 LAB — MAGNESIUM: Magnesium: 1.7 mg/dL (ref 1.7–2.4)

## 2021-05-09 LAB — CBG MONITORING, ED: Glucose-Capillary: 93 mg/dL (ref 70–99)

## 2021-05-09 MED ORDER — FUROSEMIDE 10 MG/ML IJ SOLN
40.0000 mg | Freq: Every day | INTRAMUSCULAR | Status: DC
  Administered 2021-05-10: 40 mg via INTRAVENOUS
  Filled 2021-05-09: qty 4

## 2021-05-09 MED ORDER — ALLOPURINOL 100 MG PO TABS
100.0000 mg | ORAL_TABLET | ORAL | Status: DC
  Administered 2021-05-12 – 2021-05-19 (×4): 100 mg via ORAL
  Filled 2021-05-09 (×5): qty 1

## 2021-05-09 MED ORDER — ONDANSETRON HCL 4 MG/2ML IJ SOLN: 4.0000 mg | Freq: Four times a day (QID) | INTRAMUSCULAR | Status: DC | PRN

## 2021-05-09 MED ORDER — SIMVASTATIN 20 MG PO TABS
40.0000 mg | ORAL_TABLET | Freq: Every day | ORAL | Status: DC
  Administered 2021-05-10 – 2021-05-19 (×9): 40 mg via ORAL
  Filled 2021-05-09 (×10): qty 2

## 2021-05-09 MED ORDER — RIVAROXABAN 15 MG PO TABS
15.0000 mg | ORAL_TABLET | Freq: Every day | ORAL | Status: DC
  Administered 2021-05-10: 18:00:00 15 mg via ORAL
  Filled 2021-05-09 (×2): qty 1

## 2021-05-09 MED ORDER — DOCUSATE SODIUM 100 MG PO CAPS
200.0000 mg | ORAL_CAPSULE | Freq: Every day | ORAL | Status: DC
  Administered 2021-05-10 – 2021-05-16 (×7): 200 mg via ORAL
  Filled 2021-05-09 (×7): qty 2

## 2021-05-09 MED ORDER — DILTIAZEM HCL 60 MG PO TABS
30.0000 mg | ORAL_TABLET | Freq: Two times a day (BID) | ORAL | Status: DC
  Administered 2021-05-10 – 2021-05-11 (×3): 30 mg via ORAL
  Filled 2021-05-09 (×4): qty 1

## 2021-05-09 MED ORDER — SODIUM CHLORIDE 0.9% FLUSH
3.0000 mL | Freq: Two times a day (BID) | INTRAVENOUS | Status: DC
  Administered 2021-05-10 – 2021-05-20 (×18): 3 mL via INTRAVENOUS

## 2021-05-09 MED ORDER — DILTIAZEM HCL 60 MG PO TABS: 60.0000 mg | ORAL_TABLET | Freq: Two times a day (BID) | ORAL | Status: DC

## 2021-05-09 MED ORDER — ADULT MULTIVITAMIN W/MINERALS CH
1.0000 | ORAL_TABLET | Freq: Every day | ORAL | Status: DC
  Administered 2021-05-10 – 2021-05-19 (×9): 1 via ORAL
  Filled 2021-05-09 (×10): qty 1

## 2021-05-09 MED ORDER — SODIUM CHLORIDE 0.9 % IV SOLN: 250.0000 mL | INTRAVENOUS | Status: DC | PRN

## 2021-05-09 MED ORDER — SPIRONOLACTONE 12.5 MG HALF TABLET
12.5000 mg | ORAL_TABLET | ORAL | Status: DC
  Administered 2021-05-11: 12.5 mg via ORAL
  Filled 2021-05-09: qty 1

## 2021-05-09 MED ORDER — OYSTER SHELL CALCIUM/D3 500-5 MG-MCG PO TABS
1.0000 | ORAL_TABLET | Freq: Two times a day (BID) | ORAL | Status: DC
  Administered 2021-05-10 (×2): 1 via ORAL
  Filled 2021-05-09 (×3): qty 1

## 2021-05-09 MED ORDER — FUROSEMIDE 10 MG/ML IJ SOLN
40.0000 mg | Freq: Once | INTRAMUSCULAR | Status: AC
  Administered 2021-05-09: 40 mg via INTRAVENOUS
  Filled 2021-05-09: qty 4

## 2021-05-09 MED ORDER — ALBUMIN HUMAN 25 % IV SOLN
25.0000 g | Freq: Once | INTRAVENOUS | Status: AC
  Administered 2021-05-10: 25 g via INTRAVENOUS
  Filled 2021-05-09: qty 100

## 2021-05-09 MED ORDER — FERROUS GLUCONATE 324 (38 FE) MG PO TABS
324.0000 mg | ORAL_TABLET | Freq: Every day | ORAL | Status: DC
  Administered 2021-05-10 – 2021-05-19 (×9): 324 mg via ORAL
  Filled 2021-05-09 (×13): qty 1

## 2021-05-09 MED ORDER — NYSTATIN 100000 UNIT/GM EX POWD
1.0000 "application " | Freq: Every day | CUTANEOUS | Status: DC
  Administered 2021-05-10 – 2021-05-19 (×10): 1 via TOPICAL
  Filled 2021-05-09: qty 15

## 2021-05-09 MED ORDER — PANTOPRAZOLE SODIUM 40 MG PO TBEC
40.0000 mg | DELAYED_RELEASE_TABLET | Freq: Every day | ORAL | Status: DC
  Administered 2021-05-10 – 2021-05-12 (×3): 40 mg via ORAL
  Filled 2021-05-09 (×3): qty 1

## 2021-05-09 MED ORDER — CARVEDILOL 25 MG PO TABS
25.0000 mg | ORAL_TABLET | Freq: Two times a day (BID) | ORAL | Status: DC
  Administered 2021-05-10 – 2021-05-11 (×3): 25 mg via ORAL
  Filled 2021-05-09: qty 2
  Filled 2021-05-09 (×2): qty 1

## 2021-05-09 MED ORDER — INSULIN ASPART 100 UNIT/ML IJ SOLN
0.0000 [IU] | Freq: Every day | INTRAMUSCULAR | Status: DC
  Administered 2021-05-16: 2 [IU] via SUBCUTANEOUS

## 2021-05-09 MED ORDER — ACETAMINOPHEN 325 MG PO TABS
650.0000 mg | ORAL_TABLET | ORAL | Status: DC | PRN
  Administered 2021-05-11 – 2021-05-18 (×4): 650 mg via ORAL
  Filled 2021-05-09 (×4): qty 2

## 2021-05-09 MED ORDER — INSULIN ASPART 100 UNIT/ML IJ SOLN
0.0000 [IU] | Freq: Three times a day (TID) | INTRAMUSCULAR | Status: DC
  Administered 2021-05-11: 1 [IU] via SUBCUTANEOUS
  Administered 2021-05-16: 3 [IU] via SUBCUTANEOUS
  Administered 2021-05-16 (×2): 2 [IU] via SUBCUTANEOUS
  Administered 2021-05-17: 5 [IU] via SUBCUTANEOUS
  Administered 2021-05-17: 2 [IU] via SUBCUTANEOUS

## 2021-05-09 MED ORDER — SODIUM CHLORIDE 0.9% FLUSH: 3.0000 mL | INTRAVENOUS | Status: DC | PRN

## 2021-05-09 MED ORDER — LEVOTHYROXINE SODIUM 50 MCG PO TABS
50.0000 ug | ORAL_TABLET | Freq: Every day | ORAL | Status: DC
  Administered 2021-05-10 – 2021-05-15 (×5): 50 ug via ORAL
  Filled 2021-05-09 (×3): qty 1
  Filled 2021-05-09: qty 2
  Filled 2021-05-09: qty 1

## 2021-05-09 MED ORDER — SODIUM CHLORIDE 0.9 % IV SOLN: Freq: Once | INTRAVENOUS | Status: AC

## 2021-05-09 NOTE — ED Triage Notes (Signed)
Pt BIB [facility] from [facility] Per staff c/o edema in bilateral lower extremities and her HR was low. Per EMS HR has been between 30-40. Pt has dementia and is at baseline per staff.

## 2021-05-09 NOTE — ED Provider Notes (Signed)
Pt signed out by Dr. Dina Rich.  Pt brought in due to continued lethargy and sob.  Pt's labs were very delayed, but have finally come back.   Urine nl.  BNP down to 722.  TSH 4.8.  Pt is not hypercarbic.  She has CKD with a GFR of 41.  Albumin is 1.8.  Pt is sob with movement.  Pt d/w Dr. Alcario Drought ([facility]) for admission for a CHF exac.   Isla Pence, MD 05/09/21 2314

## 2021-05-09 NOTE — ED Provider Notes (Signed)
[facility] EMERGENCY DEPARTMENT Provider Note   CSN: [id number] Arrival date & time: 05/09/21  1300     History  No chief complaint on file.   Sheila Morgan is a 88 y.o. female.  HPI  88 year old female with past medical history of CAD status post CABG, HTN, DM, CKD, hypothyroidism on Synthroid, recent COVID-19 infection with admission for hypoxia, CHF with history of A. fib/RVR on rate control and anticoagulation presents emergency department with concern for lower extremity swelling and bradycardia.  Patient is from a rehab facility.  She is noted to be in her baseline mentation however just more tired.  Patient is oriented to her name and situation but currently denies any complaints.  Home Medications Prior to Admission medications   Medication Sig Start Date End Date Taking? Authorizing Provider  acetaminophen (TYLENOL) 500 MG tablet Take 1,000 mg by mouth in the morning, at noon, and at bedtime.   Yes [provider]  alendronate (FOSAMAX) 70 MG tablet Take 70 mg by mouth every Saturday. Take with a full glass of water on an empty stomach.   Yes [provider]  allopurinol (ZYLOPRIM) 100 MG tablet Take 100 mg by mouth every Monday, Wednesday, and Friday.   Yes [provider]  Calcium Carbonate-Vitamin D 600-5 MG-MCG TABS Take 1 tablet by mouth in the morning and at bedtime.   Yes [provider]  carvedilol (COREG) 25 MG tablet Take 1 tablet (25 mg total) by mouth 2 (two) times daily with a meal. 04/17/21 05/17/21 Yes Hall, Carole N, DO  diltiazem (CARDIZEM) 120 MG tablet Take 60 mg by mouth in the morning and at bedtime. 04/17/21  Yes [provider]  docusate sodium (COLACE) 100 MG capsule Take 200 mg by mouth daily.   Yes [provider]  ferrous gluconate (FERGON) 324 MG tablet Take 324 mg by mouth in the morning and at bedtime.   Yes [provider]  furosemide (LASIX) 20 MG tablet Take 20 mg  by mouth 2 (two) times daily.   Yes [provider]  insulin lispro (HUMALOG KWIKPEN) 100 UNIT/ML KwikPen Before each meal 3 times a day, 140-199 - 2 units, 200-250 - 4 units, 251-299 - 6 units,  300-349 - 8 units,  350 or above 10 units.  Insulin PEN if approved, provide syringes and needles if needed. Patient taking differently: Inject 2-12 Units into the skin daily. Per sliding scale 04/24/21  Yes Thurnell Lose, MD  levothyroxine (SYNTHROID) 50 MCG tablet TAKE 1 TABLET(50 MCG) BY MOUTH DAILY Patient taking differently: Take 50 mcg by mouth daily before breakfast. 09/05/20  Yes Burns, Claudina Lick, MD  metFORMIN (GLUCOPHAGE) 500 MG tablet TAKE 1/2 TABLET(250 MG) BY MOUTH DAILY WITH LARGEST MEAL Patient taking differently: Take 250 mg by mouth 2 (two) times daily with a meal. 09/04/20  Yes Burns, Claudina Lick, MD  MULTIPLE VITAMIN PO Take 1 tablet by mouth daily.   Yes [provider]  nystatin (MYCOSTATIN/NYSTOP) powder Apply 1 application topically daily. To groin region and bilateral buttocks   Yes [provider]  omeprazole (PRILOSEC) 20 MG capsule Take 20 mg by mouth daily.   Yes [provider]  simvastatin (ZOCOR) 40 MG tablet TAKE 1 TABLET BY MOUTH IN THE EVENING Patient taking differently: Take 40 mg by mouth at bedtime. 01/14/21  Yes Lorretta Harp, MD  spironolactone (ALDACTONE) 25 MG tablet Take 12.5 mg by mouth every other day. Hold for SBP <  120   Yes [provider]  albuterol (VENTOLIN HFA) 108 (90 Base) MCG/ACT inhaler Inhale 2 puffs into the lungs every 6 (six) hours as needed for wheezing or shortness of breath. Patient not taking: Reported on 05/09/2021 04/17/21   Kayleen Memos, DO  Lancets (ONETOUCH DELICA PLUS [id number]) MISC USE AS DIRECTED TO TEST BLOOD SUGAR TWICE DAILY 11/04/20   Binnie Rail, MD  [facility] ULTRA test strip USE TO TEST BLOOD SUGAR TWICE DAILY 05/09/20   Binnie Rail, MD  Rivaroxaban (XARELTO) 15 MG TABS tablet Take 1  tablet (15 mg total) by mouth daily with supper. Patient not taking: Reported on 05/09/2021 04/17/21 07/16/21  Kayleen Memos, DO      Allergies    Patient has no known allergies.    Review of Systems   Review of Systems  Physical Exam Updated Vital Signs BP 109/74    Pulse 78    Temp 97.7 F (36.5 C) (Oral)    Resp 15    SpO2 98%  Physical Exam  ED Results / Procedures / Treatments   Labs (all labs ordered are listed, but only abnormal results are displayed) Labs Reviewed  CBC WITH DIFFERENTIAL/PLATELET - Abnormal; Notable for the following components:      Result Value   RBC 3.00 (*)    Hemoglobin 8.9 (*)    HCT 30.4 (*)    MCV 101.3 (*)    MCHC 29.3 (*)    RDW 17.2 (*)    All other components within normal limits  TSH - Abnormal; Notable for the following components:   TSH 4.846 (*)    All other components within normal limits  I-STAT VENOUS BLOOD GAS, ED - Abnormal; Notable for the following components:   pH, Ven 7.502 (*)    pCO2, Ven 27.1 (*)    pO2, Ven 65.0 (*)    Calcium, Ion 1.08 (*)    HCT 28.0 (*)    Hemoglobin 9.5 (*)    All other components within normal limits  COMPREHENSIVE METABOLIC PANEL  BRAIN NATRIURETIC PEPTIDE  MAGNESIUM  URINALYSIS, ROUTINE W REFLEX MICROSCOPIC  TROPONIN I (HIGH SENSITIVITY)  TROPONIN I (HIGH SENSITIVITY)    EKG EKG Interpretation  Date/Time:  Friday May 09 2021 13:07:21 EST Ventricular Rate:  49 PR Interval:    QRS Duration: 93 QT Interval:  461 QTC Calculation: 417 R Axis:   39 Text Interpretation: Atrial fibrillation Low voltage, precordial leads Minimal ST depression Confirmed by Lavenia Atlas [phone]) on 05/09/2021 2:45:09 PM  Radiology CT Head Wo Contrast  Result Date: 05/09/2021 CLINICAL DATA:  Mental status change, lethargy, bradycardia EXAM: CT HEAD WITHOUT CONTRAST TECHNIQUE: Contiguous axial images were obtained from the base of the skull through the vertex without intravenous contrast. COMPARISON:   04/19/2021 FINDINGS: Brain: Stable atrophy pattern and chronic white matter microvascular ischemic changes throughout both cerebral hemispheres. No acute intracranial hemorrhage, mass lesion, new infarction, midline shift, herniation, hydrocephalus, or extra-axial fluid collection. No focal mass effect or edema. Cisterns are patent. No cerebellar abnormality. Vascular: Intracranial atherosclerosis at the skull base. No hyperdense vessel. Skull: Normal. Negative for fracture or focal lesion. Sinuses/Orbits: No acute finding. Other: None. IMPRESSION: 1. Stable atrophy and chronic white matter microvascular ischemic changes. 2. No interval change or acute process by noncontrast CT. Electronically Signed   By: Jerilynn Mages.  Shick M.D.   On: 05/09/2021 15:55   DG Chest Port 1 View  Result Date: 05/09/2021 CLINICAL DATA:  Shortness of breath.  Additional history provided by scanning technologist: Edema in bilateral lower extremities, bradycardia, lethargy. EXAM: PORTABLE CHEST 1 VIEW COMPARISON:  Prior chest radiographs 04/21/2021 and earlier. FINDINGS: Cardiac monitoring devices overlie the chest. Prior median sternotomy/CABG. Redemonstrated cardiomegaly. Aortic atherosclerosis. Blunting of the costophrenic angles and hazy opacity at the bilateral lung bases compatible with layering pleural effusions. Superimposed opacity at the left greater than right lung bases, which may reflect atelectasis and/or pneumonia. Mild linear atelectasis versus scarring also present within the right mid lung. No appreciable pneumothorax. No acute bony abnormality identified. IMPRESSION: Cardiomegaly. Bilateral layering pleural effusions. Superimposed opacity at the left greater than right lung bases, which may reflect atelectasis and/or pneumonia. Aortic Atherosclerosis (ICD10-I70.0). Electronically Signed   By: Kellie Simmering D.O.   On: 05/09/2021 14:24    Procedures Procedures    Medications Ordered in ED Medications  0.9 %  sodium chloride  infusion ( Intravenous Infusion Verify 05/09/21 1509)    ED Course/ Medical Decision Making/ A&P                           Medical Decision Making  88 year old female presents to the emergency department from rehab facility with concern for lower extremity swelling, bradycardia and being more tired than usual.  Otherwise noted to be baseline mental status with some history of memory issues.  She is oriented to her self and location but has no acute complaints.  EMS reported bradycardia in route with a stable blood pressure, no loss of consciousness.  On arrival heart rates have remained 60-100, atrial fibrillation.  Stable blood pressure.  Blood work shows a baseline anemia and AKI.  Of note TSH is elevated at 4.8, unclear the last adjustment of Synthroid.  We are pending cardiac work-up and CMP.  CT of the head is baseline.  Chest x-ray shows cardiomegaly with pleural effusions.  Concern for CHF exacerbation however we are still pending cardiac work-up.  Signout to Dr. Gilford Raid pending blood work and reevaluation, potential admission.        Final Clinical Impression(s) / ED Diagnoses Final diagnoses:  None    Rx / DC Orders ED Discharge Orders     None         Lorelle Gibbs, DO 05/09/21 1636

## 2021-05-09 NOTE — ED Notes (Signed)
Patient transported to CT 

## 2021-05-09 NOTE — ED Notes (Signed)
Patient back from CT.

## 2021-05-09 NOTE — H&P (Signed)
History and Physical    Sheila Morgan PYK:[id number] DOB: [birth year] DOA: 05/09/2021  PCP: Binnie Rail, MD  Patient coming from: SNF  I have personally briefly reviewed patient's old medical records in [place]  Chief Complaint: Lethargy  HPI: Sheila Morgan is a 88 y.o. female with medical history significant of dCHF, HTN, ?dementia, DM2, CAD s/p CABG.  Pt with recent admits last month for COVID (12/3) and acute on chronic dCHF 12/16).  Today pt sent in form SNF with concern for BLE edema, bradycardia, and lethargy.  From [facility] at SNF it looks like her Lasix was stopped yesterday 1/5.  Symptoms are constant, persistent, moderate.  Per daughter mentation when she is awake is baseline, just more tired.  Pt oriented to name and situation.  Denies CP, SOB, pain anywhere, or other complaints.   ED Course: Albumin 1.8.  BNP 722 is actually lower than the 982 she was admitted with last month.  CXR shows B layering pleural effusions.  Bun of 12 and Creat 1.28 appears significantly improved compared to last month.  Trops 23 and 25.   Review of Systems: As per HPI, otherwise all review of systems negative.  Past Medical History:  Diagnosis Date   Arthritis    Cancer ([place])    skin on nose .  mylenoma   Coronary artery disease    status post post LAD angioplasty in 1993  and subsequent coronary artery bypass grafting x1 with a LIMA to the LAD July of 1994. She had stenting of her RCA with a bare-metal stent November 2002.   Diabetes mellitus    2   Gout    Heart murmur    Hematoma    Right groin   Hemorrhoids    Hiatal hernia    History of blood transfusion    HOH (hard of hearing)    Hyperlipidemia    Hypertension    Left knee DJD    Lightheadedness    has seen Dr Gwenlyn Found and ENT.. cant find out why   Myocardial infarction [facility])    Osteoporosis    PONV (postoperative nausea and vomiting)    Shingles 10/27/2013    Past Surgical History:  Procedure  Laterality Date   CORONARY ANGIOPLASTY  1993   LAD   CORONARY ANGIOPLASTY WITH STENT PLACEMENT  03/09/01   RCA   CORONARY ARTERY BYPASS GRAFT  1994   JOINT REPLACEMENT Right 1998   hip   NM MYOVIEW LTD  03/2012   Normal   SKIN GRAFT     to nose skin cancer   TOTAL HIP ARTHROPLASTY     TOTAL KNEE ARTHROPLASTY Left 10/09/2013   Procedure: TOTAL KNEE ARTHROPLASTY;  Surgeon: Lorn Junes, MD;  Location: [place];  Service: Orthopedics;  Laterality: Left;     reports that she has never smoked. She has never used smokeless tobacco. She reports that she does not drink alcohol and does not use drugs.  No Known Allergies  Family History  Problem Relation Age of Onset   Heart disease Father    Cancer Sister        breast   Diabetes Sister    Heart disease Brother    Heart disease Brother    Heart disease Brother    Diabetes Sister    Stroke Mother      Prior to Admission medications   Medication Sig Start Date End Date Taking? Authorizing Provider  acetaminophen (TYLENOL) 500 MG tablet Take  1,000 mg by mouth in the morning, at noon, and at bedtime.   Yes [provider]  alendronate (FOSAMAX) 70 MG tablet Take 70 mg by mouth every Saturday. Take with a full glass of water on an empty stomach.   Yes [provider]  allopurinol (ZYLOPRIM) 100 MG tablet Take 100 mg by mouth every Monday, Wednesday, and Friday.   Yes [provider]  Calcium Carbonate-Vitamin D 600-5 MG-MCG TABS Take 1 tablet by mouth in the morning and at bedtime.   Yes [provider]  carvedilol (COREG) 25 MG tablet Take 1 tablet (25 mg total) by mouth 2 (two) times daily with a meal. 04/17/21 05/17/21 Yes Hall, Carole N, DO  diltiazem (CARDIZEM) 120 MG tablet Take 60 mg by mouth in the morning and at bedtime. 04/17/21  Yes [provider]  docusate sodium (COLACE) 100 MG capsule Take 200 mg by mouth daily.   Yes [provider]  ferrous gluconate (FERGON) 324 MG  tablet Take 324 mg by mouth in the morning and at bedtime.   Yes [provider]  furosemide (LASIX) 20 MG tablet Take 20 mg by mouth 2 (two) times daily.   Yes [provider]  insulin lispro (HUMALOG KWIKPEN) 100 UNIT/ML KwikPen Before each meal 3 times a day, 140-199 - 2 units, 200-250 - 4 units, 251-299 - 6 units,  300-349 - 8 units,  350 or above 10 units.  Insulin PEN if approved, provide syringes and needles if needed. Patient taking differently: Inject 2-12 Units into the skin daily. Per sliding scale 04/24/21  Yes Thurnell Lose, MD  levothyroxine (SYNTHROID) 50 MCG tablet TAKE 1 TABLET(50 MCG) BY MOUTH DAILY Patient taking differently: Take 50 mcg by mouth daily before breakfast. 09/05/20  Yes Burns, Claudina Lick, MD  metFORMIN (GLUCOPHAGE) 500 MG tablet TAKE 1/2 TABLET(250 MG) BY MOUTH DAILY WITH LARGEST MEAL Patient taking differently: Take 250 mg by mouth 2 (two) times daily with a meal. 09/04/20  Yes Burns, Claudina Lick, MD  MULTIPLE VITAMIN PO Take 1 tablet by mouth daily.   Yes [provider]  nystatin (MYCOSTATIN/NYSTOP) powder Apply 1 application topically daily. To groin region and bilateral buttocks   Yes [provider]  omeprazole (PRILOSEC) 20 MG capsule Take 20 mg by mouth daily.   Yes [provider]  simvastatin (ZOCOR) 40 MG tablet TAKE 1 TABLET BY MOUTH IN THE EVENING Patient taking differently: Take 40 mg by mouth at bedtime. 01/14/21  Yes Lorretta Harp, MD  spironolactone (ALDACTONE) 25 MG tablet Take 12.5 mg by mouth every other day. Hold for SBP <120   Yes [provider]  Lancets (ONETOUCH DELICA PLUS [id number]) MISC USE AS DIRECTED TO TEST BLOOD SUGAR TWICE DAILY 11/04/20   Binnie Rail, MD  [facility] ULTRA test strip USE TO TEST BLOOD SUGAR TWICE DAILY 05/09/20   Binnie Rail, MD  Rivaroxaban (XARELTO) 15 MG TABS tablet Take 1 tablet (15 mg total) by mouth daily with supper. Patient not taking: Reported on 05/09/2021  04/17/21 07/16/21  Kayleen Memos, DO    Physical Exam: Vitals:   05/09/21 2115 05/09/21 2130 05/09/21 2145 05/09/21 2200  BP: (!) 125/59 122/74 117/82 125/72  Pulse: 60 (!) 116 (!) 53 89  Resp: (!) 26 (!) 24 (!) 22 (!) 27  Temp:      TempSrc:      SpO2: 99% 99% 100% 96%    Constitutional: NAD, calm, comfortable Eyes: PERRL,  lids and conjunctivae normal ENMT: Mucous membranes are moist. Posterior pharynx clear of any exudate or lesions.Normal dentition.  Neck: normal, supple, no masses, no thyromegaly Respiratory: clear to auscultation bilaterally, no wheezing, no crackles. Normal respiratory effort. No accessory muscle use.  Cardiovascular: IRR, IRR, 3+ pitting edema BLE Abdomen: no tenderness, no masses palpated. No hepatosplenomegaly. Bowel sounds positive.  Musculoskeletal: no clubbing / cyanosis. No joint deformity upper and lower extremities. Good ROM, no contractures. Normal muscle tone.  Skin: no rashes, lesions, ulcers. No induration Neurologic: CN 2-12 grossly intact. Sensation intact, DTR normal. Strength 5/5 in all 4.  Psychiatric: Pt wakes up, fluent speech, but seems disoriented / confused, cooperative.   Labs on Admission: I have personally reviewed following labs and imaging studies  CBC: Recent Labs  Lab 05/09/21 1347 05/09/21 1439  WBC 5.8  --   NEUTROABS 3.9  --   HGB 8.9* 9.5*  HCT 30.4* 28.0*  MCV 101.3*  --   PLT PLATELET CLUMPS NOTED ON SMEAR, UNABLE TO ESTIMATE  --    Basic Metabolic Panel: Recent Labs  Lab 05/09/21 1347 05/09/21 1439  NA 135 137  K 4.1 3.9  CL 106  --   CO2 19*  --   GLUCOSE 106*  --   BUN 12  --   CREATININE 1.28*  --   CALCIUM 8.3*  --   MG 1.7  --    GFR: CrCl cannot be calculated (Unknown ideal weight.). Liver Function Tests: Recent Labs  Lab 05/09/21 1347  AST 26  ALT 21  ALKPHOS 127*  BILITOT 1.0  PROT 4.7*  ALBUMIN 1.8*   No results for input(s): LIPASE, AMYLASE in the last 168 hours. No results for  input(s): AMMONIA in the last 168 hours. Coagulation Profile: No results for input(s): INR, PROTIME in the last 168 hours. Cardiac Enzymes: No results for input(s): CKTOTAL, CKMB, CKMBINDEX, TROPONINI in the last 168 hours. BNP (last 3 results) No results for input(s): PROBNP in the last 8760 hours. HbA1C: No results for input(s): HGBA1C in the last 72 hours. CBG: No results for input(s): GLUCAP in the last 168 hours. Lipid Profile: No results for input(s): CHOL, HDL, LDLCALC, TRIG, CHOLHDL, LDLDIRECT in the last 72 hours. Thyroid Function Tests: Recent Labs    05/09/21 1446  TSH 4.846*   Anemia Panel: No results for input(s): VITAMINB12, FOLATE, FERRITIN, TIBC, IRON, RETICCTPCT in the last 72 hours. Urine analysis:    Component Value Date/Time   COLORURINE YELLOW 05/09/2021 2032   APPEARANCEUR CLEAR 05/09/2021 2032   LABSPEC 1.015 05/09/2021 2032   PHURINE 5.5 05/09/2021 2032   GLUCOSEU NEGATIVE 05/09/2021 2032   GLUCOSEU NEGATIVE 02/22/2019 1014   HGBUR NEGATIVE 05/09/2021 2032   BILIRUBINUR NEGATIVE 05/09/2021 2032   KETONESUR NEGATIVE 05/09/2021 2032   PROTEINUR NEGATIVE 05/09/2021 2032   UROBILINOGEN 0.2 02/22/2019 1014   NITRITE NEGATIVE 05/09/2021 2032   LEUKOCYTESUR NEGATIVE 05/09/2021 2032    Radiological Exams on Admission: CT Head Wo Contrast  Result Date: 05/09/2021 CLINICAL DATA:  Mental status change, lethargy, bradycardia EXAM: CT HEAD WITHOUT CONTRAST TECHNIQUE: Contiguous axial images were obtained from the base of the skull through the vertex without intravenous contrast. COMPARISON:  04/19/2021 FINDINGS: Brain: Stable atrophy pattern and chronic white matter microvascular ischemic changes throughout both cerebral hemispheres. No acute intracranial hemorrhage, mass lesion, new infarction, midline shift, herniation, hydrocephalus, or extra-axial fluid collection. No focal mass effect or edema. Cisterns are patent. No cerebellar abnormality. Vascular:  Intracranial atherosclerosis at  the skull base. No hyperdense vessel. Skull: Normal. Negative for fracture or focal lesion. Sinuses/Orbits: No acute finding. Other: None. IMPRESSION: 1. Stable atrophy and chronic white matter microvascular ischemic changes. 2. No interval change or acute process by noncontrast CT. Electronically Signed   By: Jerilynn Mages.  Shick M.D.   On: 05/09/2021 15:55   DG Chest Port 1 View  Result Date: 05/09/2021 CLINICAL DATA:  Shortness of breath. Additional history provided by scanning technologist: Edema in bilateral lower extremities, bradycardia, lethargy. EXAM: PORTABLE CHEST 1 VIEW COMPARISON:  Prior chest radiographs 04/21/2021 and earlier. FINDINGS: Cardiac monitoring devices overlie the chest. Prior median sternotomy/CABG. Redemonstrated cardiomegaly. Aortic atherosclerosis. Blunting of the costophrenic angles and hazy opacity at the bilateral lung bases compatible with layering pleural effusions. Superimposed opacity at the left greater than right lung bases, which may reflect atelectasis and/or pneumonia. Mild linear atelectasis versus scarring also present within the right mid lung. No appreciable pneumothorax. No acute bony abnormality identified. IMPRESSION: Cardiomegaly. Bilateral layering pleural effusions. Superimposed opacity at the left greater than right lung bases, which may reflect atelectasis and/or pneumonia. Aortic Atherosclerosis (ICD10-I70.0). Electronically Signed   By: Kellie Simmering D.O.   On: 05/09/2021 14:24    EKG: Independently reviewed.  Assessment/Plan Principal Problem:   Acute on chronic diastolic CHF (congestive heart failure) (HCC) Active Problems:   Essential hypertension   CORONARY ARTERY BYPASS GRAFT, HX OF   Coronary artery disease   DM (diabetes mellitus) type II, controlled, with peripheral vascular disorder (HCC)   A-fib (HCC)   Hypoalbuminemia    Acute on chronic dCHF - Likely due to: Stopping lasix at SNF Think her very low albumins  are driving things here. CHF pathway 25g of 25% albumin x1 Lasix 40mg  IV now and daily Strict intake and output Tele monitor Cont QOD aldactone A.Fib - Rate controlled though did have report of bradycardia earlier today Hold cardizem for tonight Restart cardizem tomorrow at 30mg  BID Cont coreg 25mg  BID tomorrow Tele monitor Cont / resume Xarelto Hypoalbuminemia - Think this is driving her third-spacing of fluid Albumin of 1.8 today is actually better (slightly) than it had been running during last admit. Not clear on the cause Malnutrition? Checking pre-albumin I see the history of protinuria, but no protein in urine today ? Myeloma back in 2009 based on the UPEP? Checking spep upep CAD s/p CABG -  Cont BB Cont statin Cont Xarelto DM2 - Hold metformin Sensitive SSI AC/HS HTN - Cont BB Lowering cardizem dose as above. Anemia - HGB 8.9 today, stable compared to last admission  DVT prophylaxis: Xarelto Code Status: Full Family Communication: No family in room Disposition Plan: SNF after admit Consults called: None Admission status: Place in 42    Tenea Sens, [place] [facility]  How to contact the [facility] Attending or Consulting provider [place] or covering provider during after hours [place], for this patient?  Check the care team in [facility] and look for a) attending/consulting [facility] provider listed and b) the [facility] team listed Log into [email or web address]  Amion Physician Scheduling and messaging for groups and whole hospitals  On call and physician scheduling software for group practices, residents, hospitalists and other medical providers for call, clinic, rotation and shift schedules. On[employer] is a hospital-wide system for scheduling doctors and paging doctors on call. EasyPlot is for scientific plotting and data analysis.  [email or web address]  and use [place]'s universal password to access. If you do not have the password, please contact the  hospital operator.   Locate the [facility] provider you are looking for under [facility] and page to a number that you can be directly reached. If you still have difficulty reaching the provider, please page the [facility] (Director on Call) for the Hospitalists listed on amion for assistance.  05/09/2021, 11:03 PM

## 2021-05-10 DIAGNOSIS — R001 Bradycardia, unspecified: Secondary | ICD-10-CM | POA: Diagnosis not present

## 2021-05-10 DIAGNOSIS — I110 Hypertensive heart disease with heart failure: Secondary | ICD-10-CM | POA: Diagnosis not present

## 2021-05-10 DIAGNOSIS — Z66 Do not resuscitate: Secondary | ICD-10-CM | POA: Diagnosis not present

## 2021-05-10 DIAGNOSIS — I482 Chronic atrial fibrillation, unspecified: Secondary | ICD-10-CM | POA: Diagnosis not present

## 2021-05-10 DIAGNOSIS — D631 Anemia in chronic kidney disease: Secondary | ICD-10-CM | POA: Diagnosis present

## 2021-05-10 DIAGNOSIS — E1151 Type 2 diabetes mellitus with diabetic peripheral angiopathy without gangrene: Secondary | ICD-10-CM | POA: Diagnosis not present

## 2021-05-10 DIAGNOSIS — Z515 Encounter for palliative care: Secondary | ICD-10-CM | POA: Diagnosis not present

## 2021-05-10 DIAGNOSIS — D62 Acute posthemorrhagic anemia: Secondary | ICD-10-CM | POA: Diagnosis not present

## 2021-05-10 DIAGNOSIS — K2901 Acute gastritis with bleeding: Secondary | ICD-10-CM | POA: Diagnosis present

## 2021-05-10 DIAGNOSIS — F0390 Unspecified dementia without behavioral disturbance: Secondary | ICD-10-CM | POA: Diagnosis not present

## 2021-05-10 DIAGNOSIS — N179 Acute kidney failure, unspecified: Secondary | ICD-10-CM | POA: Diagnosis present

## 2021-05-10 DIAGNOSIS — R34 Anuria and oliguria: Secondary | ICD-10-CM | POA: Diagnosis not present

## 2021-05-10 DIAGNOSIS — M2550 Pain in unspecified joint: Secondary | ICD-10-CM | POA: Diagnosis not present

## 2021-05-10 DIAGNOSIS — R601 Generalized edema: Secondary | ICD-10-CM | POA: Diagnosis present

## 2021-05-10 DIAGNOSIS — R578 Other shock: Secondary | ICD-10-CM | POA: Diagnosis not present

## 2021-05-10 DIAGNOSIS — L89150 Pressure ulcer of sacral region, unstageable: Secondary | ICD-10-CM | POA: Diagnosis not present

## 2021-05-10 DIAGNOSIS — E039 Hypothyroidism, unspecified: Secondary | ICD-10-CM | POA: Diagnosis present

## 2021-05-10 DIAGNOSIS — E8809 Other disorders of plasma-protein metabolism, not elsewhere classified: Secondary | ICD-10-CM | POA: Diagnosis not present

## 2021-05-10 DIAGNOSIS — R0609 Other forms of dyspnea: Secondary | ICD-10-CM | POA: Diagnosis not present

## 2021-05-10 DIAGNOSIS — I071 Rheumatic tricuspid insufficiency: Secondary | ICD-10-CM | POA: Diagnosis present

## 2021-05-10 DIAGNOSIS — J9811 Atelectasis: Secondary | ICD-10-CM | POA: Diagnosis present

## 2021-05-10 DIAGNOSIS — D649 Anemia, unspecified: Secondary | ICD-10-CM | POA: Diagnosis not present

## 2021-05-10 DIAGNOSIS — I509 Heart failure, unspecified: Secondary | ICD-10-CM | POA: Diagnosis not present

## 2021-05-10 DIAGNOSIS — F03918 Unspecified dementia, unspecified severity, with other behavioral disturbance: Secondary | ICD-10-CM | POA: Diagnosis not present

## 2021-05-10 DIAGNOSIS — U071 COVID-19: Secondary | ICD-10-CM | POA: Diagnosis not present

## 2021-05-10 DIAGNOSIS — I5033 Acute on chronic diastolic (congestive) heart failure: Secondary | ICD-10-CM | POA: Diagnosis present

## 2021-05-10 DIAGNOSIS — R627 Adult failure to thrive: Secondary | ICD-10-CM | POA: Diagnosis not present

## 2021-05-10 DIAGNOSIS — Z7189 Other specified counseling: Secondary | ICD-10-CM | POA: Diagnosis not present

## 2021-05-10 DIAGNOSIS — N1831 Chronic kidney disease, stage 3a: Secondary | ICD-10-CM | POA: Diagnosis present

## 2021-05-10 DIAGNOSIS — K2970 Gastritis, unspecified, without bleeding: Secondary | ICD-10-CM | POA: Diagnosis not present

## 2021-05-10 DIAGNOSIS — Z7401 Bed confinement status: Secondary | ICD-10-CM | POA: Diagnosis not present

## 2021-05-10 DIAGNOSIS — E1122 Type 2 diabetes mellitus with diabetic chronic kidney disease: Secondary | ICD-10-CM | POA: Diagnosis present

## 2021-05-10 DIAGNOSIS — I2510 Atherosclerotic heart disease of native coronary artery without angina pectoris: Secondary | ICD-10-CM | POA: Diagnosis not present

## 2021-05-10 DIAGNOSIS — I10 Essential (primary) hypertension: Secondary | ICD-10-CM | POA: Diagnosis not present

## 2021-05-10 DIAGNOSIS — D509 Iron deficiency anemia, unspecified: Secondary | ICD-10-CM | POA: Diagnosis not present

## 2021-05-10 DIAGNOSIS — L89153 Pressure ulcer of sacral region, stage 3: Secondary | ICD-10-CM | POA: Diagnosis present

## 2021-05-10 DIAGNOSIS — R404 Transient alteration of awareness: Secondary | ICD-10-CM | POA: Diagnosis not present

## 2021-05-10 DIAGNOSIS — E440 Moderate protein-calorie malnutrition: Secondary | ICD-10-CM | POA: Diagnosis present

## 2021-05-10 DIAGNOSIS — E871 Hypo-osmolality and hyponatremia: Secondary | ICD-10-CM | POA: Diagnosis not present

## 2021-05-10 DIAGNOSIS — F03A18 Unspecified dementia, mild, with other behavioral disturbance: Secondary | ICD-10-CM | POA: Diagnosis present

## 2021-05-10 DIAGNOSIS — Z8616 Personal history of COVID-19: Secondary | ICD-10-CM | POA: Diagnosis not present

## 2021-05-10 DIAGNOSIS — R638 Other symptoms and signs concerning food and fluid intake: Secondary | ICD-10-CM | POA: Diagnosis not present

## 2021-05-10 DIAGNOSIS — G9341 Metabolic encephalopathy: Secondary | ICD-10-CM | POA: Diagnosis not present

## 2021-05-10 DIAGNOSIS — J9601 Acute respiratory failure with hypoxia: Secondary | ICD-10-CM | POA: Diagnosis not present

## 2021-05-10 DIAGNOSIS — E872 Acidosis, unspecified: Secondary | ICD-10-CM | POA: Diagnosis present

## 2021-05-10 DIAGNOSIS — I130 Hypertensive heart and chronic kidney disease with heart failure and stage 1 through stage 4 chronic kidney disease, or unspecified chronic kidney disease: Secondary | ICD-10-CM | POA: Diagnosis present

## 2021-05-10 DIAGNOSIS — E119 Type 2 diabetes mellitus without complications: Secondary | ICD-10-CM | POA: Diagnosis not present

## 2021-05-10 DIAGNOSIS — Z951 Presence of aortocoronary bypass graft: Secondary | ICD-10-CM | POA: Diagnosis not present

## 2021-05-10 DIAGNOSIS — K921 Melena: Secondary | ICD-10-CM | POA: Diagnosis not present

## 2021-05-10 DIAGNOSIS — I272 Pulmonary hypertension, unspecified: Secondary | ICD-10-CM | POA: Diagnosis present

## 2021-05-10 DIAGNOSIS — I4891 Unspecified atrial fibrillation: Secondary | ICD-10-CM | POA: Diagnosis not present

## 2021-05-10 DIAGNOSIS — I5021 Acute systolic (congestive) heart failure: Secondary | ICD-10-CM | POA: Diagnosis not present

## 2021-05-10 DIAGNOSIS — R570 Cardiogenic shock: Secondary | ICD-10-CM | POA: Diagnosis not present

## 2021-05-10 LAB — T4, FREE: Free T4: 1.28 ng/dL — ABNORMAL HIGH (ref 0.61–1.12)

## 2021-05-10 LAB — PREALBUMIN: Prealbumin: 7.7 mg/dL — ABNORMAL LOW (ref 18–38)

## 2021-05-10 LAB — RESP PANEL BY RT-PCR (FLU A&B, COVID) ARPGX2
Influenza A by PCR: NEGATIVE
Influenza B by PCR: NEGATIVE
SARS Coronavirus 2 by RT PCR: NEGATIVE

## 2021-05-10 LAB — BASIC METABOLIC PANEL
Anion gap: 8 (ref 5–15)
BUN: 10 mg/dL (ref 8–23)
CO2: 24 mmol/L (ref 22–32)
Calcium: 8.5 mg/dL — ABNORMAL LOW (ref 8.9–10.3)
Chloride: 107 mmol/L (ref 98–111)
Creatinine, Ser: 1.32 mg/dL — ABNORMAL HIGH (ref 0.44–1.00)
GFR, Estimated: 39 mL/min — ABNORMAL LOW (ref >60)
Glucose, Bld: 85 mg/dL (ref 70–99)
Potassium: 3.9 mmol/L (ref 3.5–5.1)
Sodium: 139 mmol/L (ref 135–145)

## 2021-05-10 LAB — CBG MONITORING, ED
Glucose-Capillary: 67 mg/dL — ABNORMAL LOW (ref 70–99)
Glucose-Capillary: 82 mg/dL (ref 70–99)

## 2021-05-10 LAB — GLUCOSE, CAPILLARY
Glucose-Capillary: 80 mg/dL (ref 70–99)
Glucose-Capillary: 104 mg/dL — ABNORMAL HIGH (ref 70–99)

## 2021-05-10 MED ORDER — FUROSEMIDE 10 MG/ML IJ SOLN
40.0000 mg | Freq: Two times a day (BID) | INTRAMUSCULAR | Status: DC
  Administered 2021-05-10 – 2021-05-11 (×2): 40 mg via INTRAVENOUS
  Filled 2021-05-10 (×2): qty 4

## 2021-05-10 MED ORDER — ALBUMIN HUMAN 25 % IV SOLN
25.0000 g | Freq: Two times a day (BID) | INTRAVENOUS | Status: DC
  Administered 2021-05-10 – 2021-05-11 (×2): 25 g via INTRAVENOUS
  Filled 2021-05-10 (×2): qty 100

## 2021-05-10 NOTE — ED Notes (Signed)
Per staff of floor, room needs to be zapped by Trudy before pt can come up.

## 2021-05-10 NOTE — ED Notes (Signed)
Per staff on 6E, EVS has not come yet to [place] the room. Charge RN aware.

## 2021-05-10 NOTE — Progress Notes (Signed)
°  Transition of Care (TOC) Screening Note   Patient Details  Name: [name] Date of Birth: Mar 13, 1933   Transition of Care [facility]) CM/SW Contact:    Alfredia Ferguson, LCSW Phone Number: 05/10/2021, 8:22 AM    Transition of Care Department [facility]) has reviewed patient and no TOC needs have been identified at this time pending further medical evaluation. Patient came to ED and was admitted from [facility]. Anticipate TOC assistance it patient returning to facility when medically cleared. We will continue to monitor patient advancement through interdisciplinary progression rounds.

## 2021-05-10 NOTE — Progress Notes (Signed)
PROGRESS NOTE  Sheila Morgan BJY:[id number] DOB: 1932-07-03 DOA: 05/09/2021 PCP: Binnie Rail, MD  Brief History   Sheila Morgan is a 88 y.o. female with medical history significant of dCHF, HTN, ?dementia, DM2, CAD s/p CABG.   Pt with recent admits last month for COVID (12/3) and acute on chronic dCHF 12/16).   Today pt sent in form SNF with concern for BLE edema, bradycardia, and lethargy.  From [facility] at SNF it looks like her Lasix was stopped yesterday 1/5.  Symptoms are constant, persistent, moderate.   Per daughter mentation when she is awake is baseline, just more tired.   Pt oriented to name and situation.   Denies CP, SOB, pain anywhere, or other complaints.   ED Course: Albumin 1.8.  BNP 722 is actually lower than the 982 she was admitted with last month.   CXR shows B layering pleural effusions.   Bun of 12 and Creat 1.28 appears significantly improved compared to last month.   Trops 23 and 25.  The patient has been admitted to a telemetry bed. She is being diuresed.   Consultants    Procedures    Antibiotics   Anti-infectives (From admission, onward)    None      Subjective  The patient is resting comfortably. No new complaints.   Objective   Vitals:  Vitals:   05/10/21 1130 05/10/21 1330  BP: 135/76 139/61  Pulse: 88 68  Resp: (!) 21 (!) 28  Temp:    SpO2: 97% 100%    Exam:  Constitutional:  The patient is awake, alert, and oriented x 3. No acute distress. Respiratory:  No increased work of breathing. No wheezes, rales, or rhonchi No tactile fremitus Cardiovascular:  Regular rate and rhythm No murmurs, ectopy, or gallups. No lateral PMI. No thrills. Abdomen:  Abdomen is soft, non-tender, non-distended No hernias, masses, or organomegaly Normoactive bowel sounds.  Musculoskeletal:  No cyanosis or clubbing 4+ pitting edema bilaterally Skin:  No rashes, lesions, ulcers palpation of skin: no induration or nodules Neurologic:   CN 2-12 intact Sensation all 4 extremities intact Psychiatric:  Mental status Mood, affect appropriate Orientation to person, place, time  judgment and insight appear intact  I have personally reviewed the following:   Today's Data  Vitals  Lab Data  BMP CBC  Micro Data    Imaging  CXR CT head  Cardiology Data  EKG Echocardiogram 04/19/2021  Scheduled Meds:  [START ON 05/12/2021] allopurinol  100 mg Oral Q M,W,F   calcium-vitamin D  1 tablet Oral BID   carvedilol  25 mg Oral BID WC   diltiazem  30 mg Oral BID   docusate sodium  200 mg Oral Daily   ferrous gluconate  324 mg Oral Q breakfast   furosemide  40 mg Intravenous Daily   insulin aspart  0-5 Units Subcutaneous QHS   insulin aspart  0-9 Units Subcutaneous TID WC   levothyroxine  50 mcg Oral Q0600   multivitamin with minerals  1 tablet Oral Daily   nystatin  1 application Topical Daily   pantoprazole  40 mg Oral Daily   Rivaroxaban  15 mg Oral Q supper   simvastatin  40 mg Oral QHS   sodium chloride flush  3 mL Intravenous Q12H   [START ON 05/11/2021] spironolactone  12.5 mg Oral QODAY   Continuous Infusions:  sodium chloride      Principal Problem:   Acute on chronic diastolic CHF (congestive heart failure) ([place])  Active Problems:   Essential hypertension   CORONARY ARTERY BYPASS GRAFT, HX OF   Coronary artery disease   DM (diabetes mellitus) type II, controlled, with peripheral vascular disorder (HCC)   A-fib (HCC)   Hypoalbuminemia   LOS: 0 days   A & P  Acute on chronic dCHF - Likely due to: Stopping lasix at SNF Think her very low albumins are driving things here. CHF pathway Continue albumin followed with lasix to optimize diuresis in the setting of hypoalbuminemia Lasix 40mg  IV now and daily Strict intake and output Tele monitor Cont QOD aldactone Echo 04/19/2021 demonstrated global hypokinesis, EF of 50% and mildly reduced LV function A.Fib - Rate controlled though did have report  of bradycardia earlier today Hold cardizem for tonight Restart cardizem tomorrow at 30mg  BID Cont coreg 25mg  BID tomorrow Tele monitor Cont / resume Xarelto Hypoalbuminemia - Think this is driving her third-spacing of fluid Albumin of 1.8 today is actually better (slightly) than it had been running during last admit. Not clear on the cause Malnutrition? Checking pre-albumin I see the history of protinuria, but no protein in urine today ? Myeloma back in 2009 based on the UPEP? Checking spep upep CAD s/p CABG -  Cont BB Cont statin Cont Xarelto DM2 - Hold metformin Sensitive SSI AC/HS HTN - Cont BB Lowering cardizem dose as above. Anemia - HGB 9.5 today, stable compared to last admission   DVT prophylaxis: Xarelto Code Status: Full Family Communication: No family in room Disposition Plan: SNF after admit    Tawfiq Favila, DO [facility] Direct contact: see [email or web address]  7PM-7AM contact night coverage as above 05/10/2021, 4:46 PM  LOS: 0 days

## 2021-05-10 NOTE — ED Notes (Signed)
Patient refused her breakfast said she wasn't hungry

## 2021-05-11 DIAGNOSIS — R001 Bradycardia, unspecified: Secondary | ICD-10-CM | POA: Diagnosis not present

## 2021-05-11 DIAGNOSIS — I959 Hypotension, unspecified: Secondary | ICD-10-CM | POA: Diagnosis not present

## 2021-05-11 DIAGNOSIS — I5033 Acute on chronic diastolic (congestive) heart failure: Secondary | ICD-10-CM | POA: Diagnosis not present

## 2021-05-11 LAB — GLUCOSE, CAPILLARY
Glucose-Capillary: 89 mg/dL (ref 70–99)
Glucose-Capillary: 118 mg/dL — ABNORMAL HIGH (ref 70–99)
Glucose-Capillary: 93 mg/dL (ref 70–99)
Glucose-Capillary: 131 mg/dL — ABNORMAL HIGH (ref 70–99)
Glucose-Capillary: 107 mg/dL — ABNORMAL HIGH (ref 70–99)

## 2021-05-11 LAB — CBC
HCT: 26.4 % — ABNORMAL LOW (ref 36.0–46.0)
Hemoglobin: 8.1 g/dL — ABNORMAL LOW (ref 12.0–15.0)
MCH: 29.7 pg (ref 26.0–34.0)
MCHC: 30.7 g/dL (ref 30.0–36.0)
MCV: 96.7 fL (ref 80.0–100.0)
Platelets: 210 10*3/uL (ref 150–400)
RBC: 2.73 MIL/uL — ABNORMAL LOW (ref 3.87–5.11)
RDW: 17.2 % — ABNORMAL HIGH (ref 11.5–15.5)
WBC: 10.8 10*3/uL — ABNORMAL HIGH (ref 4.0–10.5)
nRBC: 0 % (ref 0.0–0.2)

## 2021-05-11 LAB — CBC WITH DIFFERENTIAL/PLATELET
Abs Immature Granulocytes: 0.04 10*3/uL (ref 0.00–0.07)
Basophils Absolute: 0 10*3/uL (ref 0.0–0.1)
Basophils Relative: 0 %
Eosinophils Absolute: 0.1 10*3/uL (ref 0.0–0.5)
Eosinophils Relative: 1 %
HCT: 25.6 % — ABNORMAL LOW (ref 36.0–46.0)
Hemoglobin: 7.8 g/dL — ABNORMAL LOW (ref 12.0–15.0)
Immature Granulocytes: 1 %
Lymphocytes Relative: 12 %
Lymphs Abs: 0.8 10*3/uL (ref 0.7–4.0)
MCH: 29.4 pg (ref 26.0–34.0)
MCHC: 30.5 g/dL (ref 30.0–36.0)
MCV: 96.6 fL (ref 80.0–100.0)
Monocytes Absolute: 0.7 10*3/uL (ref 0.1–1.0)
Monocytes Relative: 9 %
Neutro Abs: 5.6 10*3/uL (ref 1.7–7.7)
Neutrophils Relative %: 77 %
Platelets: 232 10*3/uL (ref 150–400)
RBC: 2.65 MIL/uL — ABNORMAL LOW (ref 3.87–5.11)
RDW: 17.2 % — ABNORMAL HIGH (ref 11.5–15.5)
WBC: 7.2 10*3/uL (ref 4.0–10.5)
nRBC: 0 % (ref 0.0–0.2)

## 2021-05-11 LAB — POCT I-STAT 7, (LYTES, BLD GAS, ICA,H+H)
Acid-base deficit: 3 mmol/L — ABNORMAL HIGH (ref 0.0–2.0)
Bicarbonate: 21.5 mmol/L (ref 20.0–28.0)
Calcium, Ion: 1.29 mmol/L (ref 1.15–1.40)
HCT: 23 % — ABNORMAL LOW (ref 36.0–46.0)
Hemoglobin: 7.8 g/dL — ABNORMAL LOW (ref 12.0–15.0)
O2 Saturation: 88 %
Patient temperature: 97.8
Potassium: 3.7 mmol/L (ref 3.5–5.1)
Sodium: 136 mmol/L (ref 135–145)
TCO2: 23 mmol/L (ref 22–32)
pCO2 arterial: 34.6 mmHg (ref 32.0–48.0)
pH, Arterial: 7.4 (ref 7.350–7.450)
pO2, Arterial: 53 mmHg — ABNORMAL LOW (ref 83.0–108.0)

## 2021-05-11 LAB — BASIC METABOLIC PANEL
Anion gap: 11 (ref 5–15)
BUN: 12 mg/dL (ref 8–23)
CO2: 21 mmol/L — ABNORMAL LOW (ref 22–32)
Calcium: 8.4 mg/dL — ABNORMAL LOW (ref 8.9–10.3)
Chloride: 105 mmol/L (ref 98–111)
Creatinine, Ser: 1.47 mg/dL — ABNORMAL HIGH (ref 0.44–1.00)
GFR, Estimated: 35 mL/min — ABNORMAL LOW (ref >60)
Glucose, Bld: 96 mg/dL (ref 70–99)
Potassium: 3.9 mmol/L (ref 3.5–5.1)
Sodium: 137 mmol/L (ref 135–145)

## 2021-05-11 LAB — MRSA NEXT GEN BY PCR, NASAL: MRSA by PCR Next Gen: NOT DETECTED

## 2021-05-11 LAB — TROPONIN I (HIGH SENSITIVITY)
Troponin I (High Sensitivity): 17 ng/L (ref <18)
Troponin I (High Sensitivity): 17 ng/L (ref <18)

## 2021-05-11 LAB — T3, FREE: T3, Free: 2.5 pg/mL (ref 2.0–4.4)

## 2021-05-11 MED ORDER — CALCIUM GLUCONATE-NACL 1-0.675 GM/50ML-% IV SOLN
1.0000 g | Freq: Once | INTRAVENOUS | Status: AC
  Administered 2021-05-11: 1000 mg via INTRAVENOUS
  Filled 2021-05-11 (×2): qty 50

## 2021-05-11 MED ORDER — NOREPINEPHRINE 4 MG/250ML-% IV SOLN
2.0000 ug/min | INTRAVENOUS | Status: DC
  Administered 2021-05-11: 5 ug/min via INTRAVENOUS
  Administered 2021-05-12: 7 ug/min via INTRAVENOUS
  Filled 2021-05-11 (×2): qty 250

## 2021-05-11 MED ORDER — CHLORHEXIDINE GLUCONATE CLOTH 2 % EX PADS
6.0000 | MEDICATED_PAD | Freq: Every day | CUTANEOUS | Status: DC
  Administered 2021-05-11 – 2021-05-20 (×9): 6 via TOPICAL

## 2021-05-11 MED ORDER — DOPAMINE-DEXTROSE 3.2-5 MG/ML-% IV SOLN
0.0000 ug/kg/min | INTRAVENOUS | Status: DC
  Administered 2021-05-11: 5 ug/kg/min via INTRAVENOUS
  Administered 2021-05-12: 3 ug/kg/min via INTRAVENOUS
  Filled 2021-05-11: qty 250

## 2021-05-11 MED ORDER — ALBUMIN HUMAN 25 % IV SOLN
25.0000 g | Freq: Two times a day (BID) | INTRAVENOUS | Status: DC
  Administered 2021-05-11: 25 g via INTRAVENOUS
  Filled 2021-05-11: qty 100

## 2021-05-11 MED ORDER — SODIUM CHLORIDE 0.9 % IV SOLN: 250.0000 mL | INTRAVENOUS | Status: DC

## 2021-05-11 MED ORDER — SODIUM CHLORIDE 0.9 % IV BOLUS
250.0000 mL | Freq: Once | INTRAVENOUS | Status: AC
  Administered 2021-05-11: 250 mL via INTRAVENOUS

## 2021-05-11 MED ORDER — OYSTER SHELL CALCIUM/D3 500-5 MG-MCG PO TABS
1.0000 | ORAL_TABLET | Freq: Two times a day (BID) | ORAL | Status: DC
  Administered 2021-05-11 – 2021-05-19 (×15): 1 via ORAL
  Filled 2021-05-11 (×18): qty 1

## 2021-05-11 NOTE — Progress Notes (Signed)
An USGPIV (ultrasound guided PIV) has been placed for short-term vasopressor infusion. A correctly placed ivWatch must be used when administering Vasopressors. Should this treatment be needed beyond 72 hours, central line access should be obtained.  It will be the responsibility of the bedside nurse to follow best practice to prevent extravasations.   ?

## 2021-05-11 NOTE — Plan of Care (Signed)

## 2021-05-11 NOTE — Consult Note (Signed)
NAME:  Sheila Morgan, MRN:  [id number], DOB:  01-Apr-1933, LOS: 1 ADMISSION DATE:  05/09/2021, CONSULTATION DATE:  05/11/2021 REFERRING MD:  Dr. Benny Lennert, CHIEF COMPLAINT:  bradycardia/ hypotension/AMS   History of Present Illness:   25 yoF with prior PMH who presented from SNF with concerns of severe BLE edema and lethargy admitted for acute on chronic heart failure.  Recent admits in December 2022 for COVID and HF exacerbation.   Lasix stopped at SNF on 1/5.    Admitted to [facility] with ongoing diuresis with albumin f/b lasix.  Noted to be in slow Afib on admit.  On 1/8, she became bradycardic, hypotensive, and with worsening mental status.  Cardiology and PCCM consulted for further management.   Pertinent  Medical History  Afib on xarelto, HFpEF, HTN, DMT2, CAD s/p CABG, possible dementia, hypothyroid  COVID 04/05/2021  Significant Hospital Events: Including procedures, antibiotic start and stop dates in addition to other pertinent events   1/6 admitted   Interim History / Subjective:  Consulted.  Patient denies pain then falls off asleep.  Objective   Blood pressure (!) 90/57, pulse 70, temperature 97.8 F (36.6 C), temperature source Oral, resp. rate 17, height 5' 6"  (1.676 m), weight 74.3 kg, SpO2 99 %.        Intake/Output Summary (Last 24 hours) at 05/11/2021 1727 Last data filed at 05/11/2021 1505 Gross per 24 hour  Intake 290 ml  Output 450 ml  Net -160 ml   Filed Weights   05/10/21 1812 05/11/21 0500  Weight: 74 kg 74.3 kg   Examination: General:  frail elderly woman laying in bed HEENT: MM pink/moist Neuro: withdraws x 4, RASS -1 CV: s1s2 bradycardic, irregular, no m/r/g PULM:  Diminished bases GI: soft, bsx4 active  Extremities: warm/dry, 3+ edema  Skin: pale  Afebrile   Labs bicarb 21, sCr 1.32 > 1.47, Hgb 9.5 > 7.8, normal WBC/ differential CXR effusions Resolved Hospital Problem list    Assessment & Plan:   Hypotension Bradycardia  Acute on chronic HFpEF  exacerbation (vs. Just low oncotic serum pressure with severe third spacing) Encephalopathy correlating with low BPs - hold further diuresis  - s/p 250 ml NS bolus - dopamine for now, consider NE if not meeting goals or causing afib/RVR - goal MAP > 65 - pending TTE - tend troponin hs  - do not think infectious etiology - cardiology following  Afib - holding cardizem  Drop in H/H - Recheck, type/screen, hold xarelto  Hypoalbuminemia - low prealbumin - maximize nutrition as able  - RDN consult - pending workup for multiple myeloma - SPEP, UPEP  Hx CAD HTN - hold BB, coreg - continue statin  AKI on CKD3a  - Trend BMP / urinary output/ strict I/Os - Replace electrolytes as indicated - Avoid nephrotoxic agents, ensure adequate renal perfusion  DMT2 - SSI  Frail elderly with FTT- consider palliative consult if does not rebound  Best Practice (right click and "Reselect all SmartList Selections" daily)   Diet/type: NPO DVT prophylaxis: not indicated GI prophylaxis: N/A Lines: N/A Foley:  N/A and Yes, and it is still needed Code Status:  full code Last date of multidisciplinary goals of care discussion [TRH trying to reach family]  Labs   CBC: Recent Labs  Lab 05/09/21 1347 05/09/21 1439 05/11/21 0321  WBC 5.8  --  7.2  NEUTROABS 3.9  --  5.6  HGB 8.9* 9.5* 7.8*  HCT 30.4* 28.0* 25.6*  MCV 101.3*  --  96.6  PLT PLATELET CLUMPS NOTED ON SMEAR, UNABLE TO ESTIMATE  --  277    Basic Metabolic Panel: Recent Labs  Lab 05/09/21 1347 05/09/21 1439 05/10/21 0238 05/11/21 0321  NA 135 137 139 137  K 4.1 3.9 3.9 3.9  CL 106  --  107 105  CO2 19*  --  24 21*  GLUCOSE 106*  --  85 96  BUN 12  --  10 12  CREATININE 1.28*  --  1.32* 1.47*  CALCIUM 8.3*  --  8.5* 8.4*  MG 1.7  --   --   --    GFR: Estimated Creatinine Clearance: 28.3 mL/min (A) (by C-G formula based on SCr of 1.47 mg/dL (H)). Recent Labs  Lab 05/09/21 1347 05/11/21 0321  WBC 5.8 7.2     Liver Function Tests: Recent Labs  Lab 05/09/21 1347  AST 26  ALT 21  ALKPHOS 127*  BILITOT 1.0  PROT 4.7*  ALBUMIN 1.8*   No results for input(s): LIPASE, AMYLASE in the last 168 hours. No results for input(s): AMMONIA in the last 168 hours.  ABG    Component Value Date/Time   HCO3 21.2 05/09/2021 1439   TCO2 22 05/09/2021 1439   ACIDBASEDEF 1.0 05/09/2021 1439   O2SAT 95.0 05/09/2021 1439     Coagulation Profile: No results for input(s): INR, PROTIME in the last 168 hours.  Cardiac Enzymes: No results for input(s): CKTOTAL, CKMB, CKMBINDEX, TROPONINI in the last 168 hours.  HbA1C: Hemoglobin A1C  Date/Time Value Ref Range Status  01/13/2021 08:40 AM 7.0 (A) 4.0 - 5.6 % Final   HbA1c, POC (prediabetic range)  Date/Time Value Ref Range Status  01/13/2021 08:40 AM 7.0 (A) 5.7 - 6.4 % Final   HbA1c, POC (controlled diabetic range)  Date/Time Value Ref Range Status  01/13/2021 08:40 AM 7.0 0.0 - 7.0 % Final   HbA1c POC (<> result, manual entry)  Date/Time Value Ref Range Status  01/13/2021 08:40 AM 7.0 4.0 - 5.6 % Final   Hgb A1c MFr Bld  Date/Time Value Ref Range Status  04/05/2021 01:03 PM 7.2 (H) 4.8 - 5.6 % Final    Comment:    (NOTE) Pre diabetes:          5.7%-6.4%  Diabetes:              >6.4%  Glycemic control for   <7.0% adults with diabetes   09/10/2020 11:15 AM 7.5 (H) 4.6 - 6.5 % Final    Comment:    Glycemic Control Guidelines for People with Diabetes:Non Diabetic:  <6%Goal of Therapy: <7%Additional Action Suggested:  >8%     CBG: Recent Labs  Lab 05/10/21 1744 05/10/21 2159 05/11/21 0743 05/11/21 1141 05/11/21 1229  GLUCAP 80 104* 89 93 118*    Review of Systems:   Unable   Past Medical History:  She,  has a past medical history of Arthritis, Cancer ([place]), Coronary artery disease, Diabetes mellitus, Gout, Heart murmur, Hematoma, Hemorrhoids, Hiatal hernia, History of blood transfusion, HOH (hard of hearing),  Hyperlipidemia, Hypertension, Left knee DJD, Lightheadedness, Myocardial infarction ([place]), Osteoporosis, PONV (postoperative nausea and vomiting), and Shingles (10/27/2013).   Surgical History:   Past Surgical History:  Procedure Laterality Date   CORONARY ANGIOPLASTY  1993   LAD   CORONARY ANGIOPLASTY WITH STENT PLACEMENT  03/09/01   RCA   CORONARY ARTERY BYPASS GRAFT  1994   JOINT REPLACEMENT Right 1998   hip   NM MYOVIEW LTD  03/2012  Normal   SKIN GRAFT     to nose skin cancer   TOTAL HIP ARTHROPLASTY     TOTAL KNEE ARTHROPLASTY Left 10/09/2013   Procedure: TOTAL KNEE ARTHROPLASTY;  Surgeon: Lorn Junes, MD;  Location: [facility];  Service: Orthopedics;  Laterality: Left;     Social History:   reports that she has never smoked. She has never used smokeless tobacco. She reports that she does not drink alcohol and does not use drugs.   Family History:  Her family history includes Cancer in her sister; Diabetes in her sister and sister; Heart disease in her brother, brother, brother, and father; Stroke in her mother.   Allergies No Known Allergies   Home Medications  Prior to Admission medications   Medication Sig Start Date End Date Taking? Authorizing Provider  acetaminophen (TYLENOL) 500 MG tablet Take 1,000 mg by mouth in the morning, at noon, and at bedtime.   Yes [provider]  alendronate (FOSAMAX) 70 MG tablet Take 70 mg by mouth every Saturday. Take with a full glass of water on an empty stomach.   Yes [provider]  allopurinol (ZYLOPRIM) 100 MG tablet Take 100 mg by mouth every Monday, Wednesday, and Friday.   Yes [provider]  Calcium Carbonate-Vitamin D 600-5 MG-MCG TABS Take 1 tablet by mouth in the morning and at bedtime.   Yes [provider]  carvedilol (COREG) 25 MG tablet Take 1 tablet (25 mg total) by mouth 2 (two) times daily with a meal. 04/17/21 05/17/21 Yes Hall, Carole N, DO  diltiazem (CARDIZEM) 120 MG tablet Take  60 mg by mouth in the morning and at bedtime. 04/17/21  Yes [provider]  docusate sodium (COLACE) 100 MG capsule Take 200 mg by mouth daily.   Yes [provider]  ferrous gluconate (FERGON) 324 MG tablet Take 324 mg by mouth in the morning and at bedtime.   Yes [provider]  furosemide (LASIX) 20 MG tablet Take 20 mg by mouth 2 (two) times daily.   Yes [provider]  insulin lispro (HUMALOG KWIKPEN) 100 UNIT/ML KwikPen Before each meal 3 times a day, 140-199 - 2 units, 200-250 - 4 units, 251-299 - 6 units,  300-349 - 8 units,  350 or above 10 units.  Insulin PEN if approved, provide syringes and needles if needed. Patient taking differently: Inject 2-12 Units into the skin daily. Per sliding scale 04/24/21  Yes Thurnell Lose, MD  levothyroxine (SYNTHROID) 50 MCG tablet TAKE 1 TABLET(50 MCG) BY MOUTH DAILY Patient taking differently: Take 50 mcg by mouth daily before breakfast. 09/05/20  Yes Burns, Claudina Lick, MD  metFORMIN (GLUCOPHAGE) 500 MG tablet TAKE 1/2 TABLET(250 MG) BY MOUTH DAILY WITH LARGEST MEAL Patient taking differently: Take 250 mg by mouth 2 (two) times daily with a meal. 09/04/20  Yes Burns, Claudina Lick, MD  MULTIPLE VITAMIN PO Take 1 tablet by mouth daily.   Yes [provider]  nystatin (MYCOSTATIN/NYSTOP) powder Apply 1 application topically daily. To groin region and bilateral buttocks   Yes [provider]  omeprazole (PRILOSEC) 20 MG capsule Take 20 mg by mouth daily.   Yes [provider]  simvastatin (ZOCOR) 40 MG tablet TAKE 1 TABLET BY MOUTH IN THE EVENING Patient taking differently: Take 40 mg by mouth at bedtime. 01/14/21  Yes Lorretta Harp, MD  spironolactone (ALDACTONE) 25 MG tablet Take 12.5 mg by mouth every other day. Hold for SBP <120  Yes [provider]  Lancets (ONETOUCH DELICA PLUS [id number]) [place] USE AS DIRECTED TO TEST BLOOD SUGAR TWICE DAILY 11/04/20   Binnie Rail, MD  [facility]  ULTRA test strip USE TO TEST BLOOD SUGAR TWICE DAILY 05/09/20   Binnie Rail, MD  Rivaroxaban (XARELTO) 15 MG TABS tablet Take 1 tablet (15 mg total) by mouth daily with supper. Patient not taking: Reported on 05/09/2021 04/17/21 07/16/21  Kayleen Memos, DO     Combined Critical care time: 32 mins    Erskine Emery MD Kennieth Rad NP

## 2021-05-11 NOTE — Progress Notes (Signed)
PROGRESS NOTE  Sheila Morgan UUV:[id number] DOB: May 01, 1933 DOA: 05/09/2021 PCP: Binnie Rail, MD  Brief History   Sheila Morgan is a 88 y.o. female with medical history significant of dCHF, HTN, ?dementia, DM2, CAD s/p CABG.   Pt with recent admits last month for COVID (12/3) and acute on chronic dCHF 12/16).   Today pt sent in form SNF with concern for BLE edema, bradycardia, and lethargy.  From [facility] at SNF it looks like her Lasix was stopped yesterday 1/5.  Symptoms are constant, persistent, moderate.   Per daughter mentation when she is awake is baseline, just more tired.   Pt oriented to name and situation.   Denies CP, SOB, pain anywhere, or other complaints.   ED Course: Albumin 1.8.  BNP 722 is actually lower than the 982 she was admitted with last month.   CXR shows B layering pleural effusions.   Bun of 12 and Creat 1.28 appears significantly improved compared to last month.   Trops 23 and 25.  The patient has been admitted to a telemetry bed. The patient received lasix 40 mg IV on 05/10/2021.   She was continued on albumin and lasix.   Consultants  Cardiology PCCM  Procedures    Antibiotics   Anti-infectives (From admission, onward)    None      Subjective  The patient is resting comfortably. No new complaints.   Objective   Vitals:  Vitals:   05/11/21 0905 05/11/21 1504  BP: 128/74 (!) 90/57  Pulse: 75 70  Resp: 18 17  Temp: 97.8 F (36.6 C) 97.8 F (36.6 C)  SpO2: 100% 99%    Exam:  Constitutional:  The patient is awake, alert, and oriented x 3. No acute distress. Respiratory:  No increased work of breathing. No wheezes, rales, or rhonchi No tactile fremitus Cardiovascular:  Regular rate and rhythm No murmurs, ectopy, or gallups. No lateral PMI. No thrills. Abdomen:  Abdomen is soft, non-tender, non-distended No hernias, masses, or organomegaly Normoactive bowel sounds.  Musculoskeletal:  No cyanosis or clubbing 4+ pitting  edema bilaterally Skin:  No rashes, lesions, ulcers palpation of skin: no induration or nodules Neurologic:  CN 2-12 intact Sensation all 4 extremities intact Psychiatric:  Mental status Mood, affect appropriate Orientation to person, place, time  judgment and insight appear intact  I have personally reviewed the following:   Today's Data  Vitals  Lab Data  BMP CBC  Micro Data    Imaging  CXR CT head  Cardiology Data  EKG Echocardiogram 04/19/2021  Scheduled Meds:  [START ON 05/12/2021] allopurinol  100 mg Oral Q M,W,F   calcium-vitamin D  1 tablet Oral BID   docusate sodium  200 mg Oral Daily   ferrous gluconate  324 mg Oral Q breakfast   furosemide  40 mg Intravenous BID   insulin aspart  0-5 Units Subcutaneous QHS   insulin aspart  0-9 Units Subcutaneous TID WC   levothyroxine  50 mcg Oral Q0600   multivitamin with minerals  1 tablet Oral Daily   nystatin  1 application Topical Daily   pantoprazole  40 mg Oral Daily   Rivaroxaban  15 mg Oral Q supper   simvastatin  40 mg Oral QHS   sodium chloride flush  3 mL Intravenous Q12H   Continuous Infusions:  sodium chloride     albumin human 25 g (05/11/21 1644)   calcium gluconate     DOPamine      Principal Problem:  Acute on chronic diastolic CHF (congestive heart failure) (HCC) Active Problems:   Essential hypertension   CORONARY ARTERY BYPASS GRAFT, HX OF   Coronary artery disease   DM (diabetes mellitus) type II, controlled, with peripheral vascular disorder (HCC)   A-fib (HCC)   Hypoalbuminemia   Bradycardia   Hypotension   LOS: 1 day   A & P  Acute on chronic dCHF - Likely due to: Stopping lasix at SNF Think her very low albumins are driving things here. CHF pathway Continue albumin followed with lasix to optimize diuresis in the setting of hypoalbuminemia Continue albumin and lasix for diuresis.  Strict intake and output Tele monitor Cont QOD aldactone Echo 04/19/2021 demonstrated  global hypokinesis, EF of 50% and mildly reduced LV function, Diastolic dysfunction. A.Fib - Rate controlled though did have report of bradycardia earlier today Cardizem 30 mg bid and coreg 25 mg bid continued. Continue to monitor on telemetry Cont / resume Xarelto Hypoalbuminemia - Think this is driving her third-spacing of fluid Albumin of 1.8 today is actually better (slightly) than it had been running during last admit. Not clear on the cause Prealbumin very low at 7.7. SPEP, UPEP is pending to rule out Multiple myeloma Consult nutrition  CAD s/p CABG -  Cont BB Cont statin Cont Xarelto DM2 - Hold metformin Sensitive SSI AC/HS HTN - Cont BB Lowering cardizem dose as above. Anemia - HGB 9.5 today, stable compared to last admission   DVT prophylaxis: Xarelto Code Status: Full Family Communication: No family in room Disposition Plan: SNF after admit  Bobbye Petti, DO [facility] Direct contact: see [email or web address]  7PM-7AM contact night coverage as above 05/11/2021, 4:09 PM  LOS: 0 days   ADDENDUM This afternoon the patient became bradycardic into the 30's and hypotensive. She had received what was documented as her regular dose of carvedilol and diltiazem this morning at 10:00 am. She also received a dose of lasix this morning at 10:00 am. Nursing notified me of a change in the patient this afternoon. Her HR dropped into the 30's and her blood pressure dropped into the 80's she was lethargic. EKG, echocardiogram, troponins, and calcium gluconate were ordered. Cardizem, carvedilol, spironolactone, and furosemide have been discontinued. Cardiology has been consulted. Dr. Harrington Challenger was consulted. She has recommended that the patient be transferred to ICU. The patient has been discussed with PCCM. She will be transferred to the ICU.

## 2021-05-11 NOTE — Consult Note (Signed)
Cardiology Consultation:   Patient ID: Sheila Morgan MRN: [id number]; DOB: [birth year]  Admit date: 05/09/2021 Date of Consult: 05/11/2021  PCP:  Binnie Rail, MD   [facility] Cardiologist:  Quay Burow, MD        Patient Profile:   Sheila Morgan is a 88 y.o. female with a hx of atrial fib  who is being seen 05/11/2021 for the evaluation of bradycardia  at the request of Dr Benny Lennert  .  History of Present Illness:   [name] is an 88 year old with a history of hypertension.  She is followed by Dr. Alvester Chou in cardiology clinic.  Last seen in cardiology clinic in November by pharmacy.  The patient also has a history of coronary artery disease.  Remote angioplasty to the LAD in 1993, subsequent coronary artery bypass grafting in 1994 (LIMA to LAD).  She had a bare-metal stent to the RCA in 2002.  In addition she has a history of diabetes, hyperlipidemia.  Patient had an echocardiogram in December.  LVEF was  Patient had recent admissions in December 2022 for COVID as well as acute on chronic diastolic CHF.   She was admitted from a SNF on 05/09/21 with lower extremity edema, bradycardia and lethargy.  Per notes Lasix was stopped on 05/08/21.  Per daughter patient was awake but more tired.   Here in the hospital, blood pressure initially was 110s 120s.  Heart rate was 60-110s(atrial fibrillation).  Cardizem was restarted on 1/8 (30 twice daily) Coreg continued at 25 twice daily.  Patient also on Xarelto. \today albumin given prior to lasx   Cardiazem and Coreg given The pt developed progressive bradycarida this afternoon   HR 30s      BP also low       The pt denies CP   Says her butt is sore   Not good historian    Past Medical History:  Diagnosis Date   Arthritis    Cancer ([place])    skin on nose .  mylenoma   Coronary artery disease    status post post LAD angioplasty in 1993  and subsequent coronary artery bypass grafting x1 with a LIMA to the LAD July of 1994. She had  stenting of her RCA with a bare-metal stent November 2002.   Diabetes mellitus    2   Gout    Heart murmur    Hematoma    Right groin   Hemorrhoids    Hiatal hernia    History of blood transfusion    HOH (hard of hearing)    Hyperlipidemia    Hypertension    Left knee DJD    Lightheadedness    has seen Dr Gwenlyn Found and ENT.. cant find out why   Myocardial infarction [facility])    Osteoporosis    PONV (postoperative nausea and vomiting)    Shingles 10/27/2013    Past Surgical History:  Procedure Laterality Date   CORONARY ANGIOPLASTY  1993   LAD   CORONARY ANGIOPLASTY WITH STENT PLACEMENT  03/09/01   RCA   CORONARY ARTERY BYPASS GRAFT  1994   JOINT REPLACEMENT Right 1998   hip   NM MYOVIEW LTD  03/2012   Normal   SKIN GRAFT     to nose skin cancer   TOTAL HIP ARTHROPLASTY     TOTAL KNEE ARTHROPLASTY Left 10/09/2013   Procedure: TOTAL KNEE ARTHROPLASTY;  Surgeon: Lorn Junes, MD;  Location: [facility];  Service: Orthopedics;  Laterality: Left;  Inpatient Medications: Scheduled Meds:  [START ON 05/12/2021] allopurinol  100 mg Oral Q M,W,F   calcium-vitamin D  1 tablet Oral BID   docusate sodium  200 mg Oral Daily   ferrous gluconate  324 mg Oral Q breakfast   furosemide  40 mg Intravenous BID   insulin aspart  0-5 Units Subcutaneous QHS   insulin aspart  0-9 Units Subcutaneous TID WC   levothyroxine  50 mcg Oral Q0600   multivitamin with minerals  1 tablet Oral Daily   nystatin  1 application Topical Daily   pantoprazole  40 mg Oral Daily   Rivaroxaban  15 mg Oral Q supper   simvastatin  40 mg Oral QHS   sodium chloride flush  3 mL Intravenous Q12H   Continuous Infusions:  sodium chloride     albumin human 25 g (05/11/21 1644)   calcium gluconate     PRN Meds: sodium chloride, acetaminophen, ondansetron (ZOFRAN) IV, sodium chloride flush  Allergies:   No Known Allergies  Social History:   Social History   Socioeconomic History   Marital status: Widowed     Spouse name: Not on file   Number of children: 2   Years of education: Not on file   Highest education level: 12th grade  Occupational History   Occupation: Retired   Tobacco Use   Smoking status: Never   Smokeless tobacco: Never  Vaping Use   Vaping Use: Never used  Substance and Sexual Activity   Alcohol use: No   Drug use: No   Sexual activity: Never  Other Topics Concern   Not on file  Social History Narrative   Left handed    Caffeine 4 cups daily    Lives at home alone    Social Determinants of Health   Financial Resource Strain: Not on file  Food Insecurity: Not on file  Transportation Needs: Not on file  Physical Activity: Not on file  Stress: Not on file  Social Connections: Not on file  Intimate Partner Violence: Not on file    Family History:    Family History  Problem Relation Age of Onset   Heart disease Father    Cancer Sister        breast   Diabetes Sister    Heart disease Brother    Heart disease Brother    Heart disease Brother    Diabetes Sister    Stroke Mother      ROS:  Please see the history of present illness.   All other ROS reviewed and negative.     Physical Exam/Data:   Vitals:   05/10/21 1900 05/11/21 0500 05/11/21 0905 05/11/21 1504  BP: 95/65 122/71 128/74 (!) 90/57  Pulse: 74 90 75 70  Resp: 20 20 18 17   Temp: 97.7 F (36.5 C) 98.2 F (36.8 C) 97.8 F (36.6 C) 97.8 F (36.6 C)  TempSrc: Axillary Axillary Axillary Oral  SpO2: 98% 98% 100% 99%  Weight:  74.3 kg    Height:        Intake/Output Summary (Last 24 hours) at 05/11/2021 1646 Last data filed at 05/11/2021 1505 Gross per 24 hour  Intake 290 ml  Output 450 ml  Net -160 ml   Last 3 Weights 05/11/2021 05/10/2021 04/24/2021  Weight (lbs) 163 lb 12.8 oz 163 lb 2.3 oz 154 lb 5.2 oz  Weight (kg) 74.3 kg 74 kg 70 kg     Body mass index is 26.44 kg/m.  General:  Pt  appears pale   Lethargic   Awake  Mumbles a few answers   HEENT: normal Neck: no JVD Vascular: No  carotid bruits; 1+ DP   Cardiac:  ireg irreg  No S3   distant   Lungs:  Rales at bases   Abd:  Sl distended   Ext: 1+ edema    Musculoskeletal:  No deformities, BUE and BLE Moving limbs  Skin: warm and dry   Skin is doughy    reported breakdown on buttocks (wound care to see)  Neuro:  Awake   Moving limbs  No obvious focal deficit     EKG:  The EKG was personally reviewed and demonstrates:  Afib 49 bpm   Telemetry:  Telemetry was personally reviewed and demonstrates:  HR has been very labile over admit   40s to 90s    This pm has been persistently slow   30s at time Currently 50s   Relevant CV Studies:   Laboratory Data:  High Sensitivity Troponin:   Recent Labs  Lab 05/09/21 1347 05/09/21 1610  TROPONINIHS 23* 25*     Chemistry Recent Labs  Lab 05/09/21 1347 05/09/21 1439 05/10/21 0238 05/11/21 0321  NA 135 137 139 137  K 4.1 3.9 3.9 3.9  CL 106  --  107 105  CO2 19*  --  24 21*  GLUCOSE 106*  --  85 96  BUN 12  --  10 12  CREATININE 1.28*  --  1.32* 1.47*  CALCIUM 8.3*  --  8.5* 8.4*  MG 1.7  --   --   --   GFRNONAA 41*  --  39* 35*  ANIONGAP 10  --  8 11    Recent Labs  Lab 05/09/21 1347  PROT 4.7*  ALBUMIN 1.8*  AST 26  ALT 21  ALKPHOS 127*  BILITOT 1.0   Lipids No results for input(s): CHOL, TRIG, HDL, LABVLDL, LDLCALC, CHOLHDL in the last 168 hours.  Hematology Recent Labs  Lab 05/09/21 1347 05/09/21 1439 05/11/21 0321  WBC 5.8  --  7.2  RBC 3.00*  --  2.65*  HGB 8.9* 9.5* 7.8*  HCT 30.4* 28.0* 25.6*  MCV 101.3*  --  96.6  MCH 29.7  --  29.4  MCHC 29.3*  --  30.5  RDW 17.2*  --  17.2*  PLT PLATELET CLUMPS NOTED ON SMEAR, UNABLE TO ESTIMATE  --  232   Thyroid  Recent Labs  Lab 05/09/21 1446 05/10/21 1055  TSH 4.846*  --   FREET4  --  1.28*    BNP Recent Labs  Lab 05/09/21 1446  BNP 722.2*    DDimer No results for input(s): DDIMER in the last 168 hours.   Radiology/Studies:  CT Head Wo Contrast  Result Date:  05/09/2021 CLINICAL DATA:  Mental status change, lethargy, bradycardia EXAM: CT HEAD WITHOUT CONTRAST TECHNIQUE: Contiguous axial images were obtained from the base of the skull through the vertex without intravenous contrast. COMPARISON:  04/19/2021 FINDINGS: Brain: Stable atrophy pattern and chronic white matter microvascular ischemic changes throughout both cerebral hemispheres. No acute intracranial hemorrhage, mass lesion, new infarction, midline shift, herniation, hydrocephalus, or extra-axial fluid collection. No focal mass effect or edema. Cisterns are patent. No cerebellar abnormality. Vascular: Intracranial atherosclerosis at the skull base. No hyperdense vessel. Skull: Normal. Negative for fracture or focal lesion. Sinuses/Orbits: No acute finding. Other: None. IMPRESSION: 1. Stable atrophy and chronic white matter microvascular ischemic changes. 2. No interval change or acute process by noncontrast  CT. Electronically Signed   By: Jerilynn Mages.  Shick M.D.   On: 05/09/2021 15:55   DG Chest Port 1 View  Result Date: 05/09/2021 CLINICAL DATA:  Shortness of breath. Additional history provided by scanning technologist: Edema in bilateral lower extremities, bradycardia, lethargy. EXAM: PORTABLE CHEST 1 VIEW COMPARISON:  Prior chest radiographs 04/21/2021 and earlier. FINDINGS: Cardiac monitoring devices overlie the chest. Prior median sternotomy/CABG. Redemonstrated cardiomegaly. Aortic atherosclerosis. Blunting of the costophrenic angles and hazy opacity at the bilateral lung bases compatible with layering pleural effusions. Superimposed opacity at the left greater than right lung bases, which may reflect atelectasis and/or pneumonia. Mild linear atelectasis versus scarring also present within the right mid lung. No appreciable pneumothorax. No acute bony abnormality identified. IMPRESSION: Cardiomegaly. Bilateral layering pleural effusions. Superimposed opacity at the left greater than right lung bases, which may  reflect atelectasis and/or pneumonia. Aortic Atherosclerosis (ICD10-I70.0). Electronically Signed   By: Kellie Simmering D.O.   On: 05/09/2021 14:24     Assessment and Plan:   Bradycardia    Pt with atrial fibrillation    Given meds that she was noted to be taking at home  HR and BP have progressively declined today      Plan to hold    Tx to ICU for closer monitoring  Would try DA for HR/BP   Watch   may have rebound and become tachycardic Pacing pads present   Not in use    TSH 4.8 but T3 normal   Free T 4 minimally elevated    Follow response    Given her poor nutrition/sores I do not think that she would be a good  candidate for invasive Rx  2  Hypotension    Pt has received 250cc NS  BP improved   may need another bolus   Start DA   Follow   3  Hx CAD   pt with known CAD  Remote interventions   No CP  4  Acute on chronic systolic / diastolic CHF   LVEF in Dec wsa 50%   Note repeat ordered   Would make it limited Pt's low albumin is compounding problem     5  Atrial fibrillation  Hx of PAF   Has been on anticoagulation   Continue     6  FTT   Pt with failure to thrive  Low albumin   Sores    Overall need to review with family prognosis.  Pt is currently a full code.             For questions or updates, please contact [facility] Please consult [email or web address] for contact info under    Signed, Dorris Carnes, MD  05/11/2021 4:46 PM

## 2021-05-11 NOTE — Progress Notes (Signed)
After consulting Dr. Karie Kirks regarding reduced urine output. Dr. Benny Lennert stated Albumin should be given a hour before the dose of lasix. Pharmacy contacted and the timing is fixed in the system. Pt has received 12.5 mg of first dose 1200 and the 1st dose of lasix scheduled at 1000. Albumin stopped per Dr. Benny Lennert verbal order until the next dose.

## 2021-05-11 NOTE — Progress Notes (Signed)
Pt transferred to CVICU at approximately 1730. Oriented only to self. Drowsy but arousable. HR 40-60s. BP 89/47. 95% on RA. Dopamine gtt initiated at 72mcg/kg/min. BP continued to be low despite Dopamine. MD Tamala Julian made aware. Changed gtt to Levo. Foley catheter placed. Labs drawn. ABG by RT. Patient placed on 2L [place] for O2 sat of 88 on RA per I-Stat. IV Team consulted for placement of second IV. MD Tamala Julian came to beside to assess pt. Skin assessment completed with Nicki Reaper, RN. Stage 2 noted on Sacrum. Patient bathed with CHG and new sacral heart placed. Will continue to monitor.

## 2021-05-12 ENCOUNTER — Inpatient Hospital Stay (HOSPITAL_COMMUNITY): Payer: Medicare Other | Admitting: Anesthesiology

## 2021-05-12 ENCOUNTER — Encounter (HOSPITAL_COMMUNITY)
Admission: EM | Disposition: A | Discharge status: Hospice | Payer: Self-pay | Source: Home / Self Care | Attending: Internal Medicine

## 2021-05-12 ENCOUNTER — Inpatient Hospital Stay (HOSPITAL_COMMUNITY): Payer: Medicare Other

## 2021-05-12 ENCOUNTER — Encounter (HOSPITAL_COMMUNITY): Payer: Self-pay | Admitting: Internal Medicine

## 2021-05-12 DIAGNOSIS — I5033 Acute on chronic diastolic (congestive) heart failure: Secondary | ICD-10-CM | POA: Diagnosis not present

## 2021-05-12 DIAGNOSIS — R578 Other shock: Secondary | ICD-10-CM

## 2021-05-12 DIAGNOSIS — D649 Anemia, unspecified: Secondary | ICD-10-CM | POA: Diagnosis not present

## 2021-05-12 DIAGNOSIS — I5021 Acute systolic (congestive) heart failure: Secondary | ICD-10-CM | POA: Diagnosis not present

## 2021-05-12 DIAGNOSIS — K921 Melena: Secondary | ICD-10-CM | POA: Diagnosis not present

## 2021-05-12 DIAGNOSIS — D62 Acute posthemorrhagic anemia: Secondary | ICD-10-CM

## 2021-05-12 DIAGNOSIS — K2970 Gastritis, unspecified, without bleeding: Secondary | ICD-10-CM | POA: Diagnosis not present

## 2021-05-12 HISTORY — PX: ESOPHAGOGASTRODUODENOSCOPY (EGD) WITH PROPOFOL: SHX5813

## 2021-05-12 LAB — PROTIME-INR
INR: 1.9 — ABNORMAL HIGH (ref 0.8–1.2)
Prothrombin Time: 22.2 seconds — ABNORMAL HIGH (ref 11.4–15.2)

## 2021-05-12 LAB — GLUCOSE, CAPILLARY
Glucose-Capillary: 107 mg/dL — ABNORMAL HIGH (ref 70–99)
Glucose-Capillary: 108 mg/dL — ABNORMAL HIGH (ref 70–99)
Glucose-Capillary: 116 mg/dL — ABNORMAL HIGH (ref 70–99)
Glucose-Capillary: 82 mg/dL (ref 70–99)

## 2021-05-12 LAB — CBC
HCT: 25.5 % — ABNORMAL LOW (ref 36.0–46.0)
HCT: 32.9 % — ABNORMAL LOW (ref 36.0–46.0)
Hemoglobin: 7.8 g/dL — ABNORMAL LOW (ref 12.0–15.0)
Hemoglobin: 10.8 g/dL — ABNORMAL LOW (ref 12.0–15.0)
MCH: 29.4 pg (ref 26.0–34.0)
MCH: 30.9 pg (ref 26.0–34.0)
MCHC: 30.6 g/dL (ref 30.0–36.0)
MCHC: 32.8 g/dL (ref 30.0–36.0)
MCV: 96.2 fL (ref 80.0–100.0)
MCV: 94.3 fL (ref 80.0–100.0)
Platelets: 179 10*3/uL (ref 150–400)
Platelets: 190 10*3/uL (ref 150–400)
RBC: 2.65 MIL/uL — ABNORMAL LOW (ref 3.87–5.11)
RBC: 3.49 MIL/uL — ABNORMAL LOW (ref 3.87–5.11)
RDW: 17.2 % — ABNORMAL HIGH (ref 11.5–15.5)
RDW: 15.9 % — ABNORMAL HIGH (ref 11.5–15.5)
WBC: 8.6 10*3/uL (ref 4.0–10.5)
WBC: 9.4 10*3/uL (ref 4.0–10.5)
nRBC: 0 % (ref 0.0–0.2)
nRBC: 0 % (ref 0.0–0.2)

## 2021-05-12 LAB — IRON AND TIBC
Iron: 46 ug/dL (ref 28–170)
Saturation Ratios: 34 % — ABNORMAL HIGH (ref 10.4–31.8)
TIBC: 136 ug/dL — ABNORMAL LOW (ref 250–450)
UIBC: 90 ug/dL

## 2021-05-12 LAB — BASIC METABOLIC PANEL
Anion gap: 8 (ref 5–15)
Anion gap: 10 (ref 5–15)
BUN: 15 mg/dL (ref 8–23)
BUN: 16 mg/dL (ref 8–23)
CO2: 22 mmol/L (ref 22–32)
CO2: 21 mmol/L — ABNORMAL LOW (ref 22–32)
Calcium: 8.3 mg/dL — ABNORMAL LOW (ref 8.9–10.3)
Calcium: 7.7 mg/dL — ABNORMAL LOW (ref 8.9–10.3)
Chloride: 108 mmol/L (ref 98–111)
Chloride: 94 mmol/L — ABNORMAL LOW (ref 98–111)
Creatinine, Ser: 1.74 mg/dL — ABNORMAL HIGH (ref 0.44–1.00)
Creatinine, Ser: 1.88 mg/dL — ABNORMAL HIGH (ref 0.44–1.00)
GFR, Estimated: 28 mL/min — ABNORMAL LOW (ref >60)
GFR, Estimated: 26 mL/min — ABNORMAL LOW (ref >60)
Glucose, Bld: 104 mg/dL — ABNORMAL HIGH (ref 70–99)
Glucose, Bld: 489 mg/dL — ABNORMAL HIGH (ref 70–99)
Potassium: 4 mmol/L (ref 3.5–5.1)
Potassium: 3.6 mmol/L (ref 3.5–5.1)
Sodium: 138 mmol/L (ref 135–145)
Sodium: 125 mmol/L — ABNORMAL LOW (ref 135–145)

## 2021-05-12 LAB — ECHOCARDIOGRAM COMPLETE
Height: 66 in
S' Lateral: 2.8 cm
Weight: 2620.83 oz

## 2021-05-12 LAB — FERRITIN: Ferritin: 123 ng/mL (ref 11–307)

## 2021-05-12 LAB — RETICULOCYTES
Immature Retic Fract: 24.7 % — ABNORMAL HIGH (ref 2.3–15.9)
RBC.: 3.42 MIL/uL — ABNORMAL LOW (ref 3.87–5.11)
Retic Count, Absolute: 138.9 10*3/uL (ref 19.0–186.0)
Retic Ct Pct: 4.1 % — ABNORMAL HIGH (ref 0.4–3.1)

## 2021-05-12 LAB — PREPARE RBC (CROSSMATCH)

## 2021-05-12 LAB — MAGNESIUM: Magnesium: 1.5 mg/dL — ABNORMAL LOW (ref 1.7–2.4)

## 2021-05-12 LAB — OCCULT BLOOD X 1 CARD TO LAB, STOOL: Fecal Occult Bld: NEGATIVE

## 2021-05-12 LAB — FOLATE: Folate: 18.9 ng/mL (ref >5.9)

## 2021-05-12 LAB — APTT: aPTT: 54 seconds — ABNORMAL HIGH (ref 24–36)

## 2021-05-12 LAB — VITAMIN B12: Vitamin B-12: 3074 pg/mL — ABNORMAL HIGH (ref 180–914)

## 2021-05-12 SURGERY — ESOPHAGOGASTRODUODENOSCOPY (EGD) WITH PROPOFOL
Anesthesia: Monitor Anesthesia Care

## 2021-05-12 MED ORDER — PANTOPRAZOLE SODIUM 40 MG IV SOLR: 40.0000 mg | Freq: Two times a day (BID) | INTRAVENOUS | Status: DC

## 2021-05-12 MED ORDER — SODIUM CHLORIDE 0.9 % IV SOLN: INTRAVENOUS | Status: DC

## 2021-05-12 MED ORDER — PANTOPRAZOLE SODIUM 40 MG IV SOLR: 40.0000 mg | Freq: Every day | INTRAVENOUS | Status: DC

## 2021-05-12 MED ORDER — PANTOPRAZOLE SODIUM 40 MG IV SOLR
40.0000 mg | Freq: Every day | INTRAVENOUS | Status: DC
  Administered 2021-05-12 – 2021-05-13 (×2): 40 mg via INTRAVENOUS
  Filled 2021-05-12 (×2): qty 40

## 2021-05-12 MED ORDER — SODIUM CHLORIDE 0.9 % IV SOLN: INTRAVENOUS | Status: AC

## 2021-05-12 MED ORDER — PROPOFOL 500 MG/50ML IV EMUL
INTRAVENOUS | Status: DC | PRN
  Administered 2021-05-12: 75 ug/kg/min via INTRAVENOUS

## 2021-05-12 MED ORDER — EPHEDRINE SULFATE-NACL 50-0.9 MG/10ML-% IV SOSY
PREFILLED_SYRINGE | INTRAVENOUS | Status: DC | PRN
  Administered 2021-05-12 (×2): 5 mg via INTRAVENOUS
  Administered 2021-05-12: 10 mg via INTRAVENOUS

## 2021-05-12 MED ORDER — LIDOCAINE 2% (20 MG/ML) 5 ML SYRINGE
INTRAMUSCULAR | Status: DC | PRN
  Administered 2021-05-12: 100 mg via INTRAVENOUS

## 2021-05-12 MED ORDER — SODIUM CHLORIDE 0.9% IV SOLUTION: Freq: Once | INTRAVENOUS | Status: AC

## 2021-05-12 MED ORDER — HEPARIN (PORCINE) 25000 UT/250ML-% IV SOLN
1050.0000 [IU]/h | INTRAVENOUS | Status: AC
  Administered 2021-05-12: 17:00:00 1050 [IU]/h via INTRAVENOUS

## 2021-05-12 MED ORDER — HEPARIN (PORCINE) 25000 UT/250ML-% IV SOLN
1050.0000 [IU]/h | INTRAVENOUS | Status: DC
  Administered 2021-05-12: 1050 [IU]/h via INTRAVENOUS
  Filled 2021-05-12: qty 250

## 2021-05-12 MED ORDER — GERHARDT'S BUTT CREAM
TOPICAL_CREAM | Freq: Two times a day (BID) | CUTANEOUS | Status: DC
  Administered 2021-05-14 – 2021-05-15 (×2): 1 via TOPICAL
  Filled 2021-05-12 (×3): qty 1

## 2021-05-12 MED ORDER — PHENYLEPHRINE 40 MCG/ML (10ML) SYRINGE FOR IV PUSH (FOR BLOOD PRESSURE SUPPORT)
PREFILLED_SYRINGE | INTRAVENOUS | Status: DC | PRN
  Administered 2021-05-12 (×3): 80 ug via INTRAVENOUS

## 2021-05-12 MED ORDER — CALCIUM CHLORIDE 10 % IV SOLN
INTRAVENOUS | Status: DC | PRN
Start: 2021-05-12 — End: 2021-05-12
  Administered 2021-05-12 (×2): 200 mg via INTRAVENOUS

## 2021-05-12 SURGICAL SUPPLY — 15 items

## 2021-05-12 NOTE — Progress Notes (Signed)
ANTICOAGULATION CONSULT NOTE - Follow Up Consult  Pharmacy Consult for Heparin  Indication: atrial fibrillation  No Known Allergies  Patient Measurements: Height: 5\' 6"  (167.6 cm) Weight: 74.3 kg (163 lb 12.8 oz) IBW/kg (Calculated) : 59.3 Heparin Dosing Weight: 74.3 kg  Vital Signs: Temp: 97.8 F (36.6 C) (01/09 0700) Temp Source: Oral (01/09 0700) BP: 103/51 (01/09 0800) Pulse Rate: 73 (01/09 0800)  Labs: Recent Labs    05/09/21 1610 05/10/21 0238 05/11/21 0321 05/11/21 1622 05/11/21 1820 05/11/21 1839 05/12/21 0116  HGB  --   --  7.8*  --  8.1* 7.8* 7.8*  HCT  --   --  25.6*  --  26.4* 23.0* 25.5*  PLT  --   --  232  --  210  --  179  CREATININE  --  1.32* 1.47*  --   --   --  1.74*  TROPONINIHS 25*  --   --  17 17  --   --     Estimated Creatinine Clearance: 23.9 mL/min (A) (by C-G formula based on SCr of 1.74 mg/dL (H)).   Medications:  Scheduled:   allopurinol  100 mg Oral Q M,W,F   calcium-vitamin D  1 tablet Oral BID   Chlorhexidine Gluconate Cloth  6 each Topical Daily   docusate sodium  200 mg Oral Daily   ferrous gluconate  324 mg Oral Q breakfast   insulin aspart  0-5 Units Subcutaneous QHS   insulin aspart  0-9 Units Subcutaneous TID WC   levothyroxine  50 mcg Oral Q0600   multivitamin with minerals  1 tablet Oral Daily   nystatin  1 application Topical Daily   pantoprazole  40 mg Oral Daily   simvastatin  40 mg Oral QHS   sodium chloride flush  3 mL Intravenous Q12H   Infusions:   sodium chloride     sodium chloride     sodium chloride     DOPamine 3 mcg/kg/min (05/12/21 0410)   norepinephrine (LEVOPHED) Adult infusion Stopped (05/12/21 0009)    Assessment: 53 YOF with AF who presented with ADHF. On rivaroxaban 15 mg daily at home for anticoagulation. Last dose in hospital was on 05/10/21 at 1751. Hgb 7.8 but stable - continue to monitor, PLTs 179. Previously on rivaroxaban, likely still in her system - no bolus.  Goal of Therapy:   Heparin level 0.3-0.7 units/ml Monitor platelets by anticoagulation protocol: Yes   Plan:  Start heparin infusion at 1050 units/hr. No bolus aPTT and HL in 8 hrs and daily Monitor daily CBC and s/sx of bleeding  Debria Garret, Student Pharmacist 05/12/2021,9:20 AM

## 2021-05-12 NOTE — Progress Notes (Signed)
Echocardiogram 2D Echocardiogram has been performed.  Oneal Deputy Allesandra Huebsch RDCS 05/12/2021, 1:22 PM

## 2021-05-12 NOTE — Procedures (Addendum)
I spoke to the patient's daughter, [name], by phone after endoscopy.  I went over the findings and plan with her.  Time provided for questions and answers and she thanked me for the call

## 2021-05-12 NOTE — Anesthesia Preprocedure Evaluation (Addendum)
Anesthesia Evaluation  Patient identified by MRN, date of birth, ID band Patient awake    Reviewed: Allergy & Precautions, NPO status , Patient's Chart, lab work & pertinent test results, reviewed documented beta blocker date and time   History of Anesthesia Complications (+) PONV and history of anesthetic complications  Airway Mallampati: II  TM Distance: >3 FB Neck ROM: Full    Dental  (+) Dental Advisory Given, Missing   Pulmonary neg pulmonary ROS,    Pulmonary exam normal breath sounds clear to auscultation       Cardiovascular hypertension, Pt. on home beta blockers and Pt. on medications + CAD, + Past MI, + Cardiac Stents, + CABG, + Peripheral Vascular Disease and +CHF   Rhythm:Regular Rate:Tachycardia     Neuro/Psych PSYCHIATRIC DISORDERS Dementia  Neuromuscular disease    GI/Hepatic Neg liver ROS, hiatal hernia, GERD  Medicated,  Endo/Other  diabetes, Type 2, Insulin DependentHypothyroidism   Renal/GU Renal Insufficiency and ARFRenal disease     Musculoskeletal  (+) Arthritis ,   Abdominal   Peds  Hematology  (+) Blood dyscrasia (Xarelto), anemia ,   Anesthesia Other Findings Day of surgery medications reviewed with the patient.  Reproductive/Obstetrics                            Anesthesia Physical Anesthesia Plan  ASA: 4  Anesthesia Plan: MAC   Post-op Pain Management:    Induction: Intravenous  PONV Risk Score and Plan: 3 and TIVA and Treatment may vary due to age or medical condition  Airway Management Planned: Natural Airway  Additional Equipment:   Intra-op Plan:   Post-operative Plan:   Informed Consent: I have reviewed the patients History and Physical, chart, labs and discussed the procedure including the risks, benefits and alternatives for the proposed anesthesia with the patient or authorized representative who has indicated his/her understanding and  acceptance.     Dental advisory given  Plan Discussed with: CRNA  Anesthesia Plan Comments:         Anesthesia Quick Evaluation

## 2021-05-12 NOTE — Progress Notes (Signed)
CSW spoke with star from [facility] who confirmed patient comes from there short term. CSW will request PT/OT eval to determine recommendation for patient. CSW will continue to follow and assist with patients dc planning needs.

## 2021-05-12 NOTE — Progress Notes (Signed)
Heart Failure Nurse Navigator Progress Note  Following this hospitalization to assess for HV TOC readiness.   Pt currently on 3LPM [place], getting cleaned up by bedside RN concerned for melena, GI consulted.   Will attempt to interview at a later time, not medically ready. Potential HV TOC candidate.   Pricilla Holm, MSN, RN Heart Failure Nurse Navigator [phone]

## 2021-05-12 NOTE — Transfer of Care (Signed)
Immediate Anesthesia Transfer of Care Note  Patient: Sheila Morgan  Procedure(s) Performed: ESOPHAGOGASTRODUODENOSCOPY (EGD) WITH PROPOFOL  Patient Location: Endoscopy Unit  Anesthesia Type:MAC  Level of Consciousness: awake and drowsy  Airway & Oxygen Therapy: Patient Spontanous Breathing and Patient connected to face mask oxygen  Post-op Assessment: Report given to RN and Post -op Vital signs reviewed and stable  Post vital signs: Reviewed and stable  Last Vitals:  Vitals Value Taken Time  BP 146/75 05/12/21 1504  Temp    Pulse 87 05/12/21 1508  Resp 23 05/12/21 1508  SpO2 100 % 05/12/21 1508  Vitals shown include unvalidated device data.  Last Pain:  Vitals:   05/12/21 1356  TempSrc: Temporal  PainSc: 0-No pain      Patients Stated Pain Goal: 0 (94/70/96 2836)  Complications: No notable events documented.

## 2021-05-12 NOTE — Consult Note (Signed)
WOC Nurse Consult Note: Reason for Consult:Stage 3 pressure injury to sacrum Wound type:pressure Pressure Injury POA: Yes Measurement:3cm x 1.4cm x 0.2cm Wound FWY:OVZCH pale pink, dry Drainage (amount, consistency, odor) scant serous Periwound: with evidence of previous wound healing, blanching erythema Dressing procedure/placement/frequency: Patient is on a mattress replacement and is being turned and repositioned per house routine.  I have added guidance to minimize time in the supine position, pressure redistribution heel boots, a pressure redistribution chair pad for use when OOB to the chair and topical wound care guidance consisting of placing a xeroform gauze over the affected after after cleansing and then topping with a silicone foam for the sacrum (daily changes).  [facility] nursing team will not follow, but will remain available to this patient, the nursing and medical teams.  Please re-consult if needed. Thanks, Maudie Flakes, MSN, RN, [place], Arther Abbott  Pager# [phone]

## 2021-05-12 NOTE — Consult Note (Signed)
Consultation  Referring Provider:     Gerald Leitz, NP CCM Primary Care Physician:  Binnie Rail, MD Primary Gastroenterologist:  Dr. Carlean Purl (2009)       Reason for Consultation: melena, anemia         HPI:   Sheila Morgan is a 88 y.o. female with significant medical history for hypertension, hyperlipidemia, type 2 diabetes, history of myocardial infarction status post LAD 1993, subsequent coronary bypass LIMA to LAD 1994 and RCA BMS November 8841, diastolic heart failure, A. fib, dementia. (Oriented to name and situation) Presenting to the ER 01/06 with bradycardia and lethargy, from [facility], GI was consulted for 2 episodes of melena and downtrending hemoglobin. Patient has had multiple hospital visits with admission 12/3 for COVID and 12/18 for acute on chronic diastolic heart failure. Patient is oriented to name, place and situation but not to time.  Per nurse patient had very small episode of dark blood with wiping last night, then very large episode of loose black tarry BM this morning. Looking back patient has baseline hemoglobin 11-12 early December 2022, with downtrending from 04/18/2021 of 9.6, this admission had hemoglobin 9.5-->8.1 ---> 7.8.  Pending 1 unit PRBCs.  Patient lying in bed, states she is very fatigued.   States she has had decreased appetite, denies nausea, vomiting, reflux, dysphagia outpatient. Denies abdominal pain, diarrhea.  Has had harder stools lately.   Denies seeing dark, black stools or blood in the stool. Patient appears to be on omeprazole 20 mg outpatient. Discussed with daughter at bedside, unable to give much more history.   Patient was on Xarelto outpatient for afib, has been on hold this hospital admission, was on heparin drip in the hospital stopped one hour ago. Patient has untouched bedside tray next to her, per nurse only sips of coffee with medications this AM.  Patient currently on 3 L nasal cannula, Levophed for  bradycardia/hypotension.    Previous GI work up: 01/12/2008 colonoscopy WNL  EGD 2006 - Normal: Proximal Esophagus to Distal Esophagus. Comments: mildly tortuous.  - Dilation: Distal Esophagus. for dysphagia without stricture. Maloney dilator used, Diameter: 18 mm, Minimal Resistance, No Heme present on extraction. 1  total dilators used. Patient tolerance excellent.  - MUCOSAL ABNORMALITY: Cardia to Antrum. Erosions present. Erythematous mucosa. Friable mucosa. Subepithelial hemorrhage present. Biopsy/Mucosal Abn taken. RUT done, results pending. ICD9: Gastritis, Unspecified: 535.50. Comment: biopsy for H. pylori.  HIATAL HERNIA: Diaphragm 42 cm from mouth. Z-line/GE Junction 40 cm from mouth. ICD9: Hernia, Hiatal: 553.3.Comments: small, sliding.  - Normal: Duodenal Bulb to Duodenal 2nd Portion.  Past Medical History:  Diagnosis Date   Arthritis    Cancer ([place])    skin on nose .  mylenoma   Coronary artery disease    status post post LAD angioplasty in 1993  and subsequent coronary artery bypass grafting x1 with a LIMA to the LAD July of 1994. She had stenting of her RCA with a bare-metal stent November 2002.   Diabetes mellitus    2   Gout    Heart murmur    Hematoma    Right groin   Hemorrhoids    Hiatal hernia    History of blood transfusion    HOH (hard of hearing)    Hyperlipidemia    Hypertension    Left knee DJD    Lightheadedness    has seen Dr Gwenlyn Found and ENT.. cant find out why   Myocardial infarction [facility])  Osteoporosis    PONV (postoperative nausea and vomiting)    Shingles 10/27/2013    Surgical History:  She  has a past surgical history that includes Coronary angioplasty with stent (03/09/01); Total hip arthroplasty; Coronary artery bypass graft (1994); Joint replacement (Right, 1998); Skin graft; Total knee arthroplasty (Left, 10/09/2013); Coronary angioplasty (1993); and NM MYOVIEW LTD (03/2012). Family History:  Her family history includes Cancer in  her sister; Diabetes in her sister and sister; Heart disease in her brother, brother, brother, and father; Stroke in her mother. Social History:   reports that she has never smoked. She has never used smokeless tobacco. She reports that she does not drink alcohol and does not use drugs.  Prior to Admission medications   Medication Sig Start Date End Date Taking? Authorizing Provider  acetaminophen (TYLENOL) 500 MG tablet Take 1,000 mg by mouth in the morning, at noon, and at bedtime.   Yes [provider]  alendronate (FOSAMAX) 70 MG tablet Take 70 mg by mouth every Saturday. Take with a full glass of water on an empty stomach.   Yes [provider]  allopurinol (ZYLOPRIM) 100 MG tablet Take 100 mg by mouth every Monday, Wednesday, and Friday.   Yes [provider]  Calcium Carbonate-Vitamin D 600-5 MG-MCG TABS Take 1 tablet by mouth in the morning and at bedtime.   Yes [provider]  carvedilol (COREG) 25 MG tablet Take 1 tablet (25 mg total) by mouth 2 (two) times daily with a meal. 04/17/21 05/17/21 Yes Hall, Carole N, DO  diltiazem (CARDIZEM) 120 MG tablet Take 60 mg by mouth in the morning and at bedtime. 04/17/21  Yes [provider]  docusate sodium (COLACE) 100 MG capsule Take 200 mg by mouth daily.   Yes [provider]  ferrous gluconate (FERGON) 324 MG tablet Take 324 mg by mouth in the morning and at bedtime.   Yes [provider]  furosemide (LASIX) 20 MG tablet Take 20 mg by mouth 2 (two) times daily.   Yes [provider]  insulin lispro (HUMALOG KWIKPEN) 100 UNIT/ML KwikPen Before each meal 3 times a day, 140-199 - 2 units, 200-250 - 4 units, 251-299 - 6 units,  300-349 - 8 units,  350 or above 10 units.  Insulin PEN if approved, provide syringes and needles if needed. Patient taking differently: Inject 2-12 Units into the skin daily. Per sliding scale 04/24/21  Yes Thurnell Lose, MD  levothyroxine  (SYNTHROID) 50 MCG tablet TAKE 1 TABLET(50 MCG) BY MOUTH DAILY Patient taking differently: Take 50 mcg by mouth daily before breakfast. 09/05/20  Yes Burns, Claudina Lick, MD  metFORMIN (GLUCOPHAGE) 500 MG tablet TAKE 1/2 TABLET(250 MG) BY MOUTH DAILY WITH LARGEST MEAL Patient taking differently: Take 250 mg by mouth 2 (two) times daily with a meal. 09/04/20  Yes Burns, Claudina Lick, MD  MULTIPLE VITAMIN PO Take 1 tablet by mouth daily.   Yes [provider]  nystatin (MYCOSTATIN/NYSTOP) powder Apply 1 application topically daily. To groin region and bilateral buttocks   Yes [provider]  omeprazole (PRILOSEC) 20 MG capsule Take 20 mg by mouth daily.   Yes [provider]  simvastatin (ZOCOR) 40 MG tablet TAKE 1 TABLET BY MOUTH IN THE EVENING Patient taking differently: Take 40 mg by mouth at bedtime. 01/14/21  Yes Lorretta Harp, MD  spironolactone (ALDACTONE) 25 MG tablet Take 12.5 mg by mouth every other day. Hold for SBP <120   Yes [provider]  Lancets (ONETOUCH DELICA PLUS [id number]) MISC USE AS DIRECTED TO TEST BLOOD SUGAR TWICE DAILY 11/04/20   Binnie Rail, MD  [facility] ULTRA test strip USE TO TEST BLOOD SUGAR TWICE DAILY 05/09/20   Binnie Rail, MD  Rivaroxaban (XARELTO) 15 MG TABS tablet Take 1 tablet (15 mg total) by mouth daily with supper. Patient not taking: Reported on 05/09/2021 04/17/21 07/16/21  Kayleen Memos, DO    Current Facility-Administered Medications  Medication Dose Route Frequency Provider Last Rate Last Admin   0.9 %  sodium chloride infusion  250 mL Intravenous PRN Swayze, Ava, DO       0.9 %  sodium chloride infusion  250 mL Intravenous Continuous Candee Furbish, MD 10 mL/hr at 05/12/21 1007 IV Pump Association at 05/12/21 1007   0.9 %  sodium chloride infusion   Intravenous Continuous Early Osmond, MD 100 mL/hr at 05/12/21 1013 IV Pump Association at 05/12/21 1013   acetaminophen (TYLENOL) tablet 650 mg  650 mg Oral Q4H PRN Swayze,  Ava, DO   650 mg at 05/11/21 1359   allopurinol (ZYLOPRIM) tablet 100 mg  100 mg Oral Q M,W,F Swayze, Ava, DO   100 mg at 05/12/21 8469   calcium-vitamin D (OSCAL WITH D) 500-5 MG-MCG per tablet 1 tablet  1 tablet Oral BID Swayze, Ava, DO   1 tablet at 05/12/21 6295   Chlorhexidine Gluconate Cloth 2 % PADS 6 each  6 each Topical Daily Candee Furbish, MD   6 each at 05/11/21 1739   docusate sodium (COLACE) capsule 200 mg  200 mg Oral Daily Swayze, Ava, DO   200 mg at 05/12/21 0905   DOPamine (INTROPIN) 800 mg in dextrose 5 % 250 mL (3.2 mg/mL) infusion  0-20 mcg/kg/min Intravenous Titrated Candee Furbish, MD 4.18 mL/hr at 05/12/21 0410 3 mcg/kg/min at 05/12/21 0410   ferrous gluconate (FERGON) tablet 324 mg  324 mg Oral Q breakfast Swayze, Ava, DO   324 mg at 05/12/21 0933   insulin aspart (novoLOG) injection 0-5 Units  0-5 Units Subcutaneous QHS Swayze, Ava, DO       insulin aspart (novoLOG) injection 0-9 Units  0-9 Units Subcutaneous TID WC Swayze, Ava, DO   1 Units at 05/11/21 1824   levothyroxine (SYNTHROID) tablet 50 mcg  50 mcg Oral Q0600 Swayze, Ava, DO   50 mcg at 05/11/21 0615   multivitamin with minerals tablet 1 tablet  1 tablet Oral Daily Swayze, Ava, DO   1 tablet at 05/12/21 0903   norepinephrine (LEVOPHED) 4mg  in 273mL (0.016 mg/mL) premix infusion  2-10 mcg/min Intravenous Titrated Candee Furbish, MD 18.75 mL/hr at 05/12/21 1005 5 mcg/min at 05/12/21 1005   nystatin (MYCOSTATIN/NYSTOP) topical powder 1 application  1 application Topical Daily Swayze, Ava, DO   1 application at 28/41/32 0907   ondansetron (ZOFRAN) injection 4 mg  4 mg Intravenous Q6H PRN Swayze, Ava, DO       pantoprazole (PROTONIX) EC tablet 40 mg  40 mg Oral Daily Swayze, Ava, DO   40 mg at 05/12/21 0905   simvastatin (ZOCOR) tablet 40 mg  40 mg Oral QHS Swayze, Ava, DO   40 mg at 05/10/21 2212   sodium chloride flush (NS) 0.9 % injection 3 mL  3 mL Intravenous Q12H Swayze, Ava, DO   3 mL at 05/12/21 0907   sodium  chloride flush (NS) 0.9 % injection 3 mL  3 mL Intravenous PRN Swayze, Ava,  DO        Allergies as of 05/09/2021   (No Known Allergies)    Review of Systems:    Constitutional: + fatigue No weight loss, fever, chills, weakness  HEENT: Eyes: No change in vision               Ears, Nose, Throat:  No change in hearing or congestion Skin: No rash or itching Cardiovascular: No chest pain, chest pressure or palpitations   Respiratory: No SOB or cough Gastrointestinal: See HPI and otherwise negative Genitourinary: No dysuria or change in urinary frequency Neurological: No headache, dizziness or syncope Musculoskeletal: No new muscle or joint pain Hematologic: No bleeding or bruising Psychiatric: No history of depression or anxiety     Physical Exam:  Vital signs in last 24 hours: Temp:  [97.7 F (36.5 C)-97.9 F (36.6 C)] 97.8 F (36.6 C) (01/09 0700) Pulse Rate:  [34-115] 76 (01/09 1000) Resp:  [13-32] 20 (01/09 1000) BP: (78-138)/(35-122) 86/45 (01/09 1000) SpO2:  [88 %-100 %] 100 % (01/09 1000) Weight:  [74.3 kg] 74.3 kg (01/09 0412) Last BM Date: 05/12/21 (smear)  General: Pleasant elderly ill-appearing female in no acute distress Head:  Normocephalic and atraumatic Eyes: sclerae anicteric,conjunctive pale  Heart: Irregular irregular rhythm, no murmurs Pulm: Clear anteriorly; no wheezing Abdomen:  Soft, Obese AB, Normal bowel sounds.  no  tendernessWithout guarding and Without rebound, without hepatomegaly. Extremities:  With edema 2 +, R>L Msk:  Symmetrical without gross deformities.  Neurologic:  Alert and  oriented x 2;  grossly normal neurologically. Skin:   Dry and intact without significant lesions or rashes, ecchymosis bilateral hands. Psychiatric: Awake, no abnormal affect or behaviors.  LAB RESULTS: Recent Labs    05/11/21 0321 05/11/21 1820 05/11/21 1839 05/12/21 0116  WBC 7.2 10.8*  --  8.6  HGB 7.8* 8.1* 7.8* 7.8*  HCT 25.6* 26.4* 23.0* 25.5*  PLT 232  210  --  179   BMET Recent Labs    05/10/21 0238 05/11/21 0321 05/11/21 1839 05/12/21 0116  NA 139 137 136 138  K 3.9 3.9 3.7 4.0  CL 107 105  --  108  CO2 24 21*  --  22  GLUCOSE 85 96  --  104*  BUN 10 12  --  15  CREATININE 1.32* 1.47*  --  1.74*  CALCIUM 8.5* 8.4*  --  8.3*   LFT Recent Labs    05/09/21 1347  PROT 4.7*  ALBUMIN 1.8*  AST 26  ALT 21  ALKPHOS 127*  BILITOT 1.0   PT/INR No results for input(s): LABPROT, INR in the last 72 hours.  STUDIES: No results found.   Impression     Melena with hemodynamic compromise on admission Patient was on outpatient xarelto for AFib, switched to herparin which was stopped at 11 AM today. Slow decrease in H/H since 12/16, baseline 11, no evidence of occult bleeding until this AM, FOBT negative.  INR 1.9, PTT 54 EGD 2006 with gastritis, hiatal hernia Possible esophagitis, gastritis, Cameron's lesions, peptic ulcer disease. HGB 7.8 MCV 96.2 Platelets 179 BUN 15 Cr 1.74  Normocytic Anemia progressively getting worse since 12/16 Normal colonoscopy 2009, EGD 2006 with gastritis, hiatal hernia WBC 8.6 HGB 7.8 MCV 96.2 Platelets 179 No iron, ferritin, B12 checked this visit.  Pending SPEP  Acute on chronic diastolic CHF (congestive heart failure) ([place]) Being managed by CCM On levophed, lasix  A-fib ([place]) With bradycardia, off Cardizem, on levophed Discussed with CCM states stable  enough for EGD at this time.   DM (diabetes mellitus) type II, controlled, with peripheral vascular disorder ([place])  Coronary artery disease S/p CABG/stents, last low risk myoview 2013  Hypoalbuminemia Likely moderate-severe protein calorie malnutrition  Dementia At baseline    Plan   -Protonix 40 mg IV BID -NPO now --Continue to monitor H&H with transfusion as needed to maintain hemoglobin greater than 8 given cardiac history.  Pending 1 unit packed red blood cells -Discussed with CCM and appreciate their input, while  patient is on Levophed they state she is stable enough for EGD.  Patient still remains likely higher risk/marginal candidate for EGD however with continually decreasing hemoglobin and most recent episode of large-volume melena, it is prudent for patient to have endoscopic evaluation.   Heparin has been on hold since 11, patient has had sips of liquid otherwise n.p.o. today.   Plan for EGD this afternoon, discussed with daughter at bedside and she agrees to proceed.  I thoroughly discussed the procedure to include nature, alternatives, benefits, and risks including but not limited to bleeding, perforation, infection, anesthesia/cardiac and pulmonary complications. Patient's daughter provides understanding and gave verbal consent to proceed. -INR at 1.9, INR Goal <2.0 (may proceed with FFP if necessary to bring down)  Thank you for your kind consultation, we will continue to follow.  Vladimir Crofts  05/12/2021, 11:26 AM

## 2021-05-12 NOTE — Op Note (Signed)
[facility] Patient Name: Sheila Morgan Procedure Date : 05/12/2021 MRN: [id number] Attending MD: Jerene Bears , MD Date of Birth: 11-Aug-1932 CSN: [id number] Age: 88 Admit Type: Inpatient Procedure:                Upper GI endoscopy Indications:              Progressive normocytic anemia; Melena Providers:                Lajuan Lines. Hilarie Fredrickson, MD, Doristine Johns, RN, Hinton Dyer                            Technician, Technician Referring MD:             Venita Sheffield. Carlis Abbott, DO Medicines:                Monitored Anesthesia Care Complications:            No immediate complications. Estimated Blood Loss:     Estimated blood loss: none. Procedure:                Pre-Anesthesia Assessment:                           - Prior to the procedure, a History and Physical                            was performed, and patient medications and                            allergies were reviewed. The patient's tolerance of                            previous anesthesia was also reviewed. The risks                            and benefits of the procedure and the sedation                            options and risks were discussed with the patient.                            All questions were answered, and informed consent                            was obtained. Prior Anticoagulants: The patient has                            taken Eliquis (apixaban), last dose was 3 days                            prior to procedure. ASA Grade Assessment: IV - A                            patient with severe systemic disease that is a  constant threat to life. After reviewing the risks                            and benefits, the patient was deemed in                            satisfactory condition to undergo the procedure.                           After obtaining informed consent, the endoscope was                            passed under direct vision. Throughout the                             procedure, the patient's blood pressure, pulse, and                            oxygen saturations were monitored continuously. The                            [id number]-[id number] ([id number]) Olympus endoscope was introduced                            through the mouth, and advanced to the third part                            of duodenum. The upper GI endoscopy was                            accomplished without difficulty. The patient                            tolerated the procedure well. Scope In: Scope Out: Findings:      The examined esophagus was normal.      A 2 cm hiatal hernia was present.      Localized mild inflammation characterized by erosions and erythema was       found in the prepyloric region of the stomach.      The examined duodenum was normal. Impression:               - Normal esophagus.                           - 2 cm hiatal hernia.                           - Very mild and nonbleeding pre-pyloric gastritis                            with small erosions. Not felt to be a bleeding                            source.                           -  Normal examined duodenum.                           - No evidence of UGI bleed to the examined portion                            of D3.                           - No specimens collected. Moderate Sedation:      N/A Recommendation:           - Return patient to hospital ward for ongoing care.                           - Advance diet as tolerated.                           - Continue present medications. Okay for heparin                            infusion per primary team.                           - Once daily PPI is sufficient.                           - Monitor for further melenic stools. Monitor                            response to pRBC transfusion. Colonoscopy not                            recommended at this time given medical comorbidity.                           - GI will sign off for now, but remain available.                             Call if questions. Procedure Code(s):        --- Professional ---                           [phone], Esophagogastroduodenoscopy, flexible,                            transoral; diagnostic, including collection of                            specimen(s) by brushing or washing, when performed                            (separate procedure) Diagnosis Code(s):        --- Professional ---                           K44.9, Diaphragmatic hernia without obstruction or  gangrene                           K29.70, Gastritis, unspecified, without bleeding                           D50.0, Iron deficiency anemia secondary to blood                            loss (chronic)                           K92.1, Melena (includes Hematochezia) CPT copyright 2019 [employer] Association. All rights reserved. The codes documented in this report are preliminary and upon coder review may  be revised to meet current compliance requirements. Jerene Bears, MD 05/12/2021 3:18:21 PM This report has been signed electronically. Number of Addenda: 0

## 2021-05-12 NOTE — Anesthesia Postprocedure Evaluation (Signed)
Anesthesia Post Note  Patient: Sheila Morgan  Procedure(s) Performed: ESOPHAGOGASTRODUODENOSCOPY (EGD) WITH PROPOFOL     Patient location during evaluation: Endoscopy Anesthesia Type: MAC Level of consciousness: awake Pain management: pain level controlled Vital Signs Assessment: post-procedure vital signs reviewed and stable Respiratory status: spontaneous breathing Cardiovascular status: stable Postop Assessment: no apparent nausea or vomiting Anesthetic complications: no   No notable events documented.  Last Vitals:  Vitals:   05/12/21 1406 05/12/21 1504  BP: (!) 160/52 (!) 146/75  Pulse:  72  Resp:  (!) 28  Temp:  36.6 C  SpO2:  100%    Last Pain:  Vitals:   05/12/21 1504  TempSrc: Temporal  PainSc:                  Geovanie Winnett

## 2021-05-12 NOTE — Progress Notes (Addendum)
PCCM progress note  Notified by RN patient had dark stool concerning for melena.  This in the setting of downtrending hemoglobin to 7.8 raises concern for for GI bleed.  Decision was made to stop heparin drip.  Will consult GI to evaluate for need of endoscopy.  Given persistent need for pressors in the above setting we will go ahead and transfuse 1 unit packed red blood cells now.   Bienvenido Proehl D. Kenton Kingfisher, NP-C [facility] Personal contact information can be found on Amion  05/12/2021, 11:14 AM

## 2021-05-12 NOTE — Plan of Care (Signed)
  Problem: Clinical Measurements: Goal: Respiratory complications will improve Outcome: Progressing   Problem: Nutrition: Goal: Adequate nutrition will be maintained Outcome: Progressing   

## 2021-05-12 NOTE — Progress Notes (Signed)
Progress Note  Patient Name: Sheila Morgan Date of Encounter: 05/12/2021  [facility] Cardiologist: Quay Burow, MD   Subjective   No complaints this AM.  On my query, AO x 2 (does not know year).  Remains on DPA with reasonable HR now.  Inpatient Medications    Scheduled Meds:  allopurinol  100 mg Oral Q M,W,F   calcium-vitamin D  1 tablet Oral BID   Chlorhexidine Gluconate Cloth  6 each Topical Daily   docusate sodium  200 mg Oral Daily   ferrous gluconate  324 mg Oral Q breakfast   insulin aspart  0-5 Units Subcutaneous QHS   insulin aspart  0-9 Units Subcutaneous TID WC   levothyroxine  50 mcg Oral Q0600   multivitamin with minerals  1 tablet Oral Daily   nystatin  1 application Topical Daily   pantoprazole  40 mg Oral Daily   simvastatin  40 mg Oral QHS   sodium chloride flush  3 mL Intravenous Q12H   Continuous Infusions:  sodium chloride     sodium chloride     DOPamine 3 mcg/kg/min (05/12/21 0410)   norepinephrine (LEVOPHED) Adult infusion Stopped (05/12/21 0009)   PRN Meds: sodium chloride, acetaminophen, ondansetron (ZOFRAN) IV, sodium chloride flush   Vital Signs    Vitals:   05/12/21 0615 05/12/21 0630 05/12/21 0700 05/12/21 0800  BP: (!) 112/46 (!) 107/56 (!) 134/122 (!) 103/51  Pulse: 87 76 70 73  Resp: 17 18 13  (!) 28  Temp:   97.8 F (36.6 C)   TempSrc:   Oral   SpO2: 99% 100% 98% 93%  Weight:      Height:        Intake/Output Summary (Last 24 hours) at 05/12/2021 0842 Last data filed at 05/12/2021 0700 Gross per 24 hour  Intake 537.1 ml  Output 315 ml  Net 222.1 ml   Last 3 Weights 05/12/2021 05/11/2021 05/10/2021  Weight (lbs) 163 lb 12.8 oz 163 lb 12.8 oz 163 lb 2.3 oz  Weight (kg) 74.3 kg 74.3 kg 74 kg      Telemetry    AF, rate controlled - Personally Reviewed  ECG    AF with slow VR without BBB 05/09/21 - Personally Reviewed  Physical Exam   GEN: No acute distress.  Dry MM Neck: No JVD Cardiac: Irregular RR, no murmurs,  rubs, or gallops.  Respiratory: Clear to auscultation bilaterally. GI: Soft, nontender, non-distended  MS: No edema; No deformity. Neuro:  Nonfocal  Psych: Normal affect   Labs    High Sensitivity Troponin:   Recent Labs  Lab 05/09/21 1347 05/09/21 1610 05/11/21 1622 05/11/21 1820  TROPONINIHS 23* 25* 17 17     Chemistry Recent Labs  Lab 05/09/21 1347 05/09/21 1439 05/10/21 0238 05/11/21 0321 05/11/21 1839 05/12/21 0116  NA 135   < > 139 137 136 138  K 4.1   < > 3.9 3.9 3.7 4.0  CL 106  --  107 105  --  108  CO2 19*  --  24 21*  --  22  GLUCOSE 106*  --  85 96  --  104*  BUN 12  --  10 12  --  15  CREATININE 1.28*  --  1.32* 1.47*  --  1.74*  CALCIUM 8.3*  --  8.5* 8.4*  --  8.3*  MG 1.7  --   --   --   --   --   PROT 4.7*  --   --   --   --   --  ALBUMIN 1.8*  --   --   --   --   --   AST 26  --   --   --   --   --   ALT 21  --   --   --   --   --   ALKPHOS 127*  --   --   --   --   --   BILITOT 1.0  --   --   --   --   --   GFRNONAA 41*  --  39* 35*  --  28*  ANIONGAP 10  --  8 11  --  8   < > = values in this interval not displayed.    Lipids No results for input(s): CHOL, TRIG, HDL, LABVLDL, LDLCALC, CHOLHDL in the last 168 hours.  Hematology Recent Labs  Lab 05/11/21 0321 05/11/21 1820 05/11/21 1839 05/12/21 0116  WBC 7.2 10.8*  --  8.6  RBC 2.65* 2.73*  --  2.65*  HGB 7.8* 8.1* 7.8* 7.8*  HCT 25.6* 26.4* 23.0* 25.5*  MCV 96.6 96.7  --  96.2  MCH 29.4 29.7  --  29.4  MCHC 30.5 30.7  --  30.6  RDW 17.2* 17.2*  --  17.2*  PLT 232 210  --  179   Thyroid  Recent Labs  Lab 05/09/21 1446 05/10/21 1055  TSH 4.846*  --   FREET4  --  1.28*    BNP Recent Labs  Lab 05/09/21 1446  BNP 722.2*    DDimer No results for input(s): DDIMER in the last 168 hours.   Radiology    No results found.  Cardiac Studies   TTE pending  Prior TTE January 10, 2023 with preserved EF 65-70% with mild MR, mild to moderate TR  Patient Profile     88 y.o. female AF  on Xarelto, CAD s/p CABG with LIMA to LAD and BMS RCA 2002, HTN, CKD Cr ~1.3-1.4 here with failure to thrive/lethargy/bilateral edema treated with diuretics, developed AKI, and bradycardia.  Assessment & Plan    Atrial fibrillation with slow ventricular rate/bradycardia:  Rates improved now.  Currently on DPA but will be transitioning from DPA to norepinephrine.  Will see how her rates are over the next day or so.  Given slow ventricular rate and the need for rate controlling agents, may need to consider EP consult and PPM.  Echo pending.  Holding home Xarelto now.  Given possible need for PPM, will start hep gtt to cover her for now.  AKI:  Cr up to 1.7.  She looks dry on exam, will give judicious IVF back (100cc/hr x 500cc).  Acute on chronic diastolic dysfunction:  Treated with diuretics.  Ideally would like to start SGLT2 inhibitor, will wait for renal function to improve.  Hold lasix for now.  Looks euvolemic if not dry today.  W  CAD:  s/p LIMA to LAD and BMS RCA 2002:  No ASA in lieu of Xarelto.  Holding BB for now.  Cont statin.  HL:  Cont statin.  Hypotension:  Unclear etiology, follow up echo (I suspect will be relatively unchanged vs previous), give judicious IVF, monitor for sepsis, consider evaluating for adrenal insufficiency.      For questions or updates, please contact [place] Please consult [email or web address] for contact info under        Signed, Early Osmond, MD  05/12/2021, 8:42 AM

## 2021-05-12 NOTE — Progress Notes (Signed)
ANTICOAGULATION CONSULT NOTE - Follow Up Consult  Pharmacy Consult for Heparin  Indication: atrial fibrillation  No Known Allergies  Patient Measurements: Height: 5\' 6"  (167.6 cm) Weight: 74.3 kg (163 lb 12.8 oz) IBW/kg (Calculated) : 59.3 Heparin Dosing Weight: 74.3 kg  Vital Signs: Temp: 97.9 F (36.6 C) (01/09 1545) Temp Source: Oral (01/09 1545) BP: 134/82 (01/09 1600) Pulse Rate: 78 (01/09 1615)  Labs: Recent Labs    05/10/21 0238 05/11/21 0321 05/11/21 0321 05/11/21 1622 05/11/21 1820 05/11/21 1839 05/12/21 0116 05/12/21 0956  HGB  --  7.8*   < >  --  8.1* 7.8* 7.8*  --   HCT  --  25.6*   < >  --  26.4* 23.0* 25.5*  --   PLT  --  232  --   --  210  --  179  --   APTT  --   --   --   --   --   --   --  54*  LABPROT  --   --   --   --   --   --   --  22.2*  INR  --   --   --   --   --   --   --  1.9*  CREATININE 1.32* 1.47*  --   --   --   --  1.74*  --   TROPONINIHS  --   --   --  17 17  --   --   --    < > = values in this interval not displayed.     Estimated Creatinine Clearance: 23.9 mL/min (A) (by C-G formula based on SCr of 1.74 mg/dL (H)).   Medications:  Scheduled:   allopurinol  100 mg Oral Q M,W,F   calcium-vitamin D  1 tablet Oral BID   Chlorhexidine Gluconate Cloth  6 each Topical Daily   docusate sodium  200 mg Oral Daily   ferrous gluconate  324 mg Oral Q breakfast   insulin aspart  0-5 Units Subcutaneous QHS   insulin aspart  0-9 Units Subcutaneous TID WC   levothyroxine  50 mcg Oral Q0600   multivitamin with minerals  1 tablet Oral Daily   nystatin  1 application Topical Daily   pantoprazole (PROTONIX) IV  40 mg Intravenous Q12H   simvastatin  40 mg Oral QHS   sodium chloride flush  3 mL Intravenous Q12H   Infusions:   sodium chloride     sodium chloride 10 mL/hr at 05/12/21 1300   norepinephrine (LEVOPHED) Adult infusion 10 mcg/min (05/12/21 1528)    Assessment: 65 YOF with AF who presented with ADHF. On rivaroxaban 15 mg  daily at home for anticoagulation. Last dose in hospital was on 05/10/21 at 1751. Previously on rivaroxaban, likely still in her system - no bolus.  Heparin was stopped after melena and concern for GIB - underwent endoscopy 1/9 finding mild/non-bleeding gastritis w/ small erosions - okay with GI to restart heparin. Hgb 7.8, plt 179.    Goal of Therapy:  Heparin level 0.3-0.7 units/ml Monitor platelets by anticoagulation protocol: Yes   Plan:  Restart heparin infusion at 1050 units/hr. No bolus aPTT and HL in 8 hrs and daily Monitor daily CBC and s/sx of bleeding  Antonietta Jewel, PharmD, BCCCP Clinical Pharmacist  Phone: [phone] 05/12/2021 4:20 PM  Please check AMION for all [facility] phone numbers After 10:00 PM, call [place] [phone]

## 2021-05-12 NOTE — Progress Notes (Signed)
NAME:  Sheila Morgan, MRN:  [id number], DOB:  09-Aug-1932, LOS: 2 ADMISSION DATE:  05/09/2021, CONSULTATION DATE:  05/11/2021 REFERRING MD:  Dr. Benny Lennert, CHIEF COMPLAINT:  bradycardia/ hypotension/AMS   History of Present Illness:   66 yoF with prior PMH who presented from SNF with concerns of severe BLE edema and lethargy admitted for acute on chronic heart failure.  Recent admits in December 2022 for COVID and HF exacerbation.   Lasix stopped at SNF on 1/5.    Admitted to [facility] with ongoing diuresis with albumin f/b lasix.  Noted to be in slow Afib on admit.  On 1/8, she became bradycardic, hypotensive, and with worsening mental status.  Cardiology and PCCM consulted for further management.   Pertinent  Medical History  Afib on xarelto, HFpEF, HTN, DMT2, CAD s/p CABG, possible dementia, hypothyroid  COVID 04/05/2021  Significant Hospital Events: Including procedures, antibiotic start and stop dates in addition to other pertinent events   1/6 admitted  1/8, she became bradycardic, hypotensive, and with worsening mental status.  Cardiology and PCCM consulted for further management.  1/9 no acute events overnight, changed from dobutamine to norepinephrine this a.m.  Interim History / Subjective:  States she feels well on bedside assessment able to answer all questions appropriately but appears mildly encephalopathic  Objective   Blood pressure (!) 103/51, pulse 73, temperature 97.8 F (36.6 C), temperature source Oral, resp. rate (!) 28, height 5' 6"  (1.676 m), weight 74.3 kg, SpO2 93 %.        Intake/Output Summary (Last 24 hours) at 05/12/2021 0940 Last data filed at 05/12/2021 0700 Gross per 24 hour  Intake 537.1 ml  Output 315 ml  Net 222.1 ml    Filed Weights   05/10/21 1812 05/11/21 0500 05/12/21 0412  Weight: 74 kg 74.3 kg 74.3 kg   Examination: General: Acute on chronic deconditioned elderly female lying in bed in no acute distress HEENT: Why/AT, MM pink/moist, PERRL,  Neuro:  Alert and oriented x3 but appears faintly encephalopathic CV: s1s2 regular rate and rhythm, no murmur, rubs, or gallops,  PULM: Clear to auscultation bilaterally, no increased work of breathing, no added breath sounds, oxygen saturations mid 90s on 3 L nasal cannula GI: soft, bowel sounds active in all 4 quadrants, non-tender, non-distended Extremities: warm/dry, 1+ pitting bilateral lower extremity edema edema  Skin: no rashes or lesions  Resolved Hospital Problem list    Assessment & Plan:   Atrial fibrillation with slow ventricular rate/bradycardia -Started on dobutamine overnight with transition to norepinephrine morning of 1/9 Hypotension Acute on chronic HFpEF exacerbation (vs. Just low oncotic serum pressure with severe third spacing) Hx CAD HTN P: Cardiology following, appreciate assistance May need to consider EP consult and possible pacemaker Follow-up echocardiogram Hold home Cardizem and beta-blocker Home Xarelto on hold need for possible pacemaker Heparin drip MAP goal greater than 65 Continuous telemetry No overt signs of sepsis currently follow hemodynamics post gentle IV hydration per cardiology and follow-up echocardiogram  Encephalopathy correlating with low BP P: Hemodynamic support as above Maintain neuro protective measures Nutrition and bowel regiment  Seizure precautions  Aspirations precautions   Drop in H/H -Hemoglobin slight downtrend from 9.0 12/22-7.81/9 P: Home Xarelto remains on hold as above Trend H&H Transfuse per protocol Hemoglobin goal greater than 7  Hypoalbuminemia - pending workup for multiple myeloma - low prealbumin P: Supplement protein as able Dietitian consult  AKI on CKD3a  -Creatinine 1.3 612/22 with up trended 1.741/9 P: Follow renal function  Monitor urine output Hold further diuretics Trend Bmet Avoid nephrotoxins, ensure adequate renal perfusion  Gentle IV hydration  DMT2 P: Continue SSI CBG every 4  hours CBG goal 140-180  Frail elderly with FTT P: Daily engagement with family to determine adequate goals of care  Best Practice (right click and "Reselect all SmartList Selections" daily)   Diet/type: NPO DVT prophylaxis: not indicated GI prophylaxis: N/A Lines: N/A Foley:  N/A and Yes, and it is still needed Code Status:  full code Last date of multidisciplinary goals of care discussion [TRH trying to reach family]  Critical care    Performed by: Bard Haupert D. Harris  Total critical care time: 38 minutes  Critical care time was exclusive of separately billable procedures and treating other patients.  Critical care was necessary to treat or prevent imminent or life-threatening deterioration.  Critical care was time spent personally by me on the following activities: development of treatment plan with patient and/or surrogate as well as nursing, discussions with consultants, evaluation of patient's response to treatment, examination of patient, obtaining history from patient or surrogate, ordering and performing treatments and interventions, ordering and review of laboratory studies, ordering and review of radiographic studies, pulse oximetry and re-evaluation of patient's condition.  Shyler Hamill D. Kenton Kingfisher, NP-C [facility] Personal contact information can be found on Amion  05/12/2021, 9:48 AM

## 2021-05-12 NOTE — Anesthesia Procedure Notes (Signed)
Procedure Name: MAC Date/Time: 05/12/2021 2:33 PM Performed by: Dorann Lodge, CRNA Pre-anesthesia Checklist: Patient identified, Emergency Drugs available, Suction available, Patient being monitored and Timeout performed Patient Re-evaluated:Patient Re-evaluated prior to induction Oxygen Delivery Method: Simple face mask Induction Type: IV induction Airway Equipment and Method: Bite block Dental Injury: Teeth and Oropharynx as per pre-operative assessment

## 2021-05-12 NOTE — Progress Notes (Incomplete)
Heart Failure Navigator Progress Note  Assessed for Heart & Vascular TOC clinic readiness.  Patient does not meet criteria due to ***.   Navigator available for reassessment of patient.   ***

## 2021-05-13 ENCOUNTER — Other Ambulatory Visit (HOSPITAL_COMMUNITY): Payer: Self-pay

## 2021-05-13 ENCOUNTER — Encounter (HOSPITAL_COMMUNITY): Payer: Self-pay | Admitting: Internal Medicine

## 2021-05-13 DIAGNOSIS — D509 Iron deficiency anemia, unspecified: Secondary | ICD-10-CM

## 2021-05-13 DIAGNOSIS — N179 Acute kidney failure, unspecified: Secondary | ICD-10-CM

## 2021-05-13 DIAGNOSIS — I5033 Acute on chronic diastolic (congestive) heart failure: Secondary | ICD-10-CM | POA: Diagnosis not present

## 2021-05-13 LAB — CBC
HCT: 36.3 % (ref 36.0–46.0)
Hemoglobin: 11.7 g/dL — ABNORMAL LOW (ref 12.0–15.0)
MCH: 30.8 pg (ref 26.0–34.0)
MCHC: 32.2 g/dL (ref 30.0–36.0)
MCV: 95.5 fL (ref 80.0–100.0)
Platelets: 181 10*3/uL (ref 150–400)
RBC: 3.8 MIL/uL — ABNORMAL LOW (ref 3.87–5.11)
RDW: 16.6 % — ABNORMAL HIGH (ref 11.5–15.5)
WBC: 7.3 10*3/uL (ref 4.0–10.5)
nRBC: 0 % (ref 0.0–0.2)

## 2021-05-13 LAB — UPEP/UIFE/LIGHT CHAINS/TP, 24-HR UR
% BETA, Urine: 11.1 %
ALPHA 1 URINE: 0.6 %
Albumin, U: 64.9 %
Alpha 2, Urine: 5.6 %
Free Kappa Lt Chains,Ur: 120.52 mg/L — ABNORMAL HIGH (ref 1.17–86.46)
Free Kappa/Lambda Ratio: 7.53 (ref 1.83–14.26)
Free Lambda Lt Chains,Ur: 16 mg/L — ABNORMAL HIGH (ref 0.27–15.21)
GAMMA GLOBULIN URINE: 17.8 %
Total Protein, Urine-Ur/day: 110 mg/24 hr (ref 30–150)
Total Protein, Urine: 27.4 mg/dL
Total Volume: 400

## 2021-05-13 LAB — TYPE AND SCREEN
ABO/RH(D): AB POS
Antibody Screen: NEGATIVE
Unit division: 0
Unit division: 0

## 2021-05-13 LAB — GLUCOSE, CAPILLARY
Glucose-Capillary: 94 mg/dL (ref 70–99)
Glucose-Capillary: 96 mg/dL (ref 70–99)
Glucose-Capillary: 102 mg/dL — ABNORMAL HIGH (ref 70–99)
Glucose-Capillary: 111 mg/dL — ABNORMAL HIGH (ref 70–99)

## 2021-05-13 LAB — BPAM RBC
Blood Product Expiration Date: 202302032359
Blood Product Expiration Date: 202302092359
ISSUE DATE / TIME: 202301091230
ISSUE DATE / TIME: 202301091230
Unit Type and Rh: 8400
Unit Type and Rh: 8400

## 2021-05-13 LAB — HEPARIN LEVEL (UNFRACTIONATED): Heparin Unfractionated: >1.1 IU/mL — ABNORMAL HIGH (ref 0.30–0.70)

## 2021-05-13 LAB — BASIC METABOLIC PANEL
Anion gap: 10 (ref 5–15)
BUN: 20 mg/dL (ref 8–23)
CO2: 19 mmol/L — ABNORMAL LOW (ref 22–32)
Calcium: 8.6 mg/dL — ABNORMAL LOW (ref 8.9–10.3)
Chloride: 106 mmol/L (ref 98–111)
Creatinine, Ser: 2.03 mg/dL — ABNORMAL HIGH (ref 0.44–1.00)
GFR, Estimated: 23 mL/min — ABNORMAL LOW (ref >60)
Glucose, Bld: 94 mg/dL (ref 70–99)
Potassium: 4 mmol/L (ref 3.5–5.1)
Sodium: 135 mmol/L (ref 135–145)

## 2021-05-13 LAB — APTT: aPTT: 73 seconds — ABNORMAL HIGH (ref 24–36)

## 2021-05-13 LAB — MAGNESIUM: Magnesium: 1.5 mg/dL — ABNORMAL LOW (ref 1.7–2.4)

## 2021-05-13 LAB — BRAIN NATRIURETIC PEPTIDE: B Natriuretic Peptide: 444.5 pg/mL — ABNORMAL HIGH (ref 0.0–100.0)

## 2021-05-13 MED ORDER — APIXABAN 2.5 MG PO TABS
2.5000 mg | ORAL_TABLET | Freq: Two times a day (BID) | ORAL | Status: DC
  Administered 2021-05-13 – 2021-05-19 (×13): 2.5 mg via ORAL
  Filled 2021-05-13 (×16): qty 1

## 2021-05-13 MED ORDER — MAGNESIUM SULFATE 4 GM/100ML IV SOLN
4.0000 g | Freq: Once | INTRAVENOUS | Status: AC
  Administered 2021-05-13: 4 g via INTRAVENOUS
  Filled 2021-05-13: qty 100

## 2021-05-13 MED ORDER — ALBUMIN HUMAN 5 % IV SOLN
25.0000 g | Freq: Once | INTRAVENOUS | Status: AC
  Administered 2021-05-13: 25 g via INTRAVENOUS
  Filled 2021-05-13: qty 500

## 2021-05-13 MED ORDER — RIVAROXABAN 15 MG PO TABS
15.0000 mg | ORAL_TABLET | Freq: Every day | ORAL | Status: DC
  Filled 2021-05-13: qty 1

## 2021-05-13 MED ORDER — FUROSEMIDE 10 MG/ML IJ SOLN
20.0000 mg | Freq: Once | INTRAMUSCULAR | Status: AC
  Administered 2021-05-13: 20 mg via INTRAVENOUS
  Filled 2021-05-13: qty 2

## 2021-05-13 NOTE — Progress Notes (Signed)
Progress Note  Patient Name: Sheila Morgan Date of Encounter: 05/13/2021  [facility] Cardiologist: Quay Burow, MD    Subjective   Developed GIB yesterday after hep gtt-->EGD with erosive gastritis so hep gtt resumed.  HR and BP improved.  No significant complaints today.  Still AO x 2  Cr up to 2.  Inpatient Medications    Scheduled Meds:  allopurinol  100 mg Oral Q M,W,F   calcium-vitamin D  1 tablet Oral BID   Chlorhexidine Gluconate Cloth  6 each Topical Daily   docusate sodium  200 mg Oral Daily   ferrous gluconate  324 mg Oral Q breakfast   Gerhardt's butt cream   Topical BID   insulin aspart  0-5 Units Subcutaneous QHS   insulin aspart  0-9 Units Subcutaneous TID WC   levothyroxine  50 mcg Oral Q0600   multivitamin with minerals  1 tablet Oral Daily   nystatin  1 application Topical Daily   pantoprazole (PROTONIX) IV  40 mg Intravenous QHS   simvastatin  40 mg Oral QHS   sodium chloride flush  3 mL Intravenous Q12H   Continuous Infusions:  sodium chloride     sodium chloride Stopped (05/12/21 1959)   heparin 1,050 Units/hr (05/13/21 0700)   norepinephrine (LEVOPHED) Adult infusion Stopped (05/12/21 2046)   PRN Meds: sodium chloride, acetaminophen, ondansetron (ZOFRAN) IV, sodium chloride flush   Vital Signs    Vitals:   05/13/21 0500 05/13/21 0600 05/13/21 0630 05/13/21 0700  BP: 105/66 (!) 99/57  106/71  Pulse: 98 73  78  Resp: (!) 24 17  (!) 28  Temp:   97.9 F (36.6 C)   TempSrc:   Oral   SpO2: 97% 96%  95%  Weight:      Height:        Intake/Output Summary (Last 24 hours) at 05/13/2021 0925 Last data filed at 05/13/2021 0700 Gross per 24 hour  Intake 2270.01 ml  Output 103 ml  Net 2167.01 ml   Last 3 Weights 05/13/2021 05/12/2021 05/12/2021  Weight (lbs) 168 lb 163 lb 12.8 oz 163 lb 12.8 oz  Weight (kg) 76.204 kg 74.3 kg 74.3 kg      Telemetry    AF rate controlled- Personally Reviewed  ECG    AF with slow VR without BBB  05/09/21 - Personally Reviewed  Physical Exam   GEN: No acute distress, AO x 2, dry MM Neck: No JVD Cardiac: Irregular, no murmurs, rubs, or gallops.  Respiratory: Clear to auscultation bilaterally. GI: Soft, nontender, non-distended  MS: No edema; No deformity. Neuro:  Nonfocal  Psych: Normal affect   Labs    High Sensitivity Troponin:   Recent Labs  Lab 05/09/21 1347 05/09/21 1610 05/11/21 1622 05/11/21 1820  TROPONINIHS 23* 25* 17 17     Chemistry Recent Labs  Lab 05/09/21 1347 05/09/21 1439 05/12/21 0116 05/12/21 1631 05/13/21 0115  NA 135   < > 138 125* 135  K 4.1   < > 4.0 3.6 4.0  CL 106   < > 108 94* 106  CO2 19*   < > 22 21* 19*  GLUCOSE 106*   < > 104* 489* 94  BUN 12   < > 15 16 20   CREATININE 1.28*   < > 1.74* 1.88* 2.03*  CALCIUM 8.3*   < > 8.3* 7.7* 8.6*  MG 1.7  --   --  1.5* 1.5*  PROT 4.7*  --   --   --   --  ALBUMIN 1.8*  --   --   --   --   AST 26  --   --   --   --   ALT 21  --   --   --   --   ALKPHOS 127*  --   --   --   --   BILITOT 1.0  --   --   --   --   GFRNONAA 41*   < > 28* 26* 23*  ANIONGAP 10   < > 8 10 10    < > = values in this interval not displayed.    Lipids No results for input(s): CHOL, TRIG, HDL, LABVLDL, LDLCALC, CHOLHDL in the last 168 hours.  Hematology Recent Labs  Lab 05/12/21 0116 05/12/21 1631 05/13/21 0406  WBC 8.6 9.4 7.3  RBC 2.65* 3.49*   3.42* 3.80*  HGB 7.8* 10.8* 11.7*  HCT 25.5* 32.9* 36.3  MCV 96.2 94.3 95.5  MCH 29.4 30.9 30.8  MCHC 30.6 32.8 32.2  RDW 17.2* 15.9* 16.6*  PLT 179 190 181   Thyroid  Recent Labs  Lab 05/09/21 1446 05/10/21 1055  TSH 4.846*  --   FREET4  --  1.28*    BNP Recent Labs  Lab 05/09/21 1446  BNP 722.2*    DDimer No results for input(s): DDIMER in the last 168 hours.   Radiology    ECHOCARDIOGRAM COMPLETE  Result Date: 05/12/2021    ECHOCARDIOGRAM REPORT   Patient Name:   Sheila Morgan Date of Exam: 05/12/2021 Medical Rec #:  [id number]       Height:        66.0 in Accession #:    [id number]      Weight:       163.8 lb Date of Birth:  Feb 28, 1933       BSA:          1.837 m Patient Age:    88 years        BP:           110/66 mmHg Patient Gender: F               HR:           108 bpm. Exam Location:  Inpatient Procedure: 2D Echo, Color Doppler and Cardiac Doppler Indications:    S97.02 Acute systolic (congestive) heart failure  History:        Patient has prior history of Echocardiogram examinations, most                 recent 04/19/2021. CHF, Prior CABG; Risk Factors:Hypertension,                 Diabetes and Dyslipidemia.  Sonographer:    Raquel Sarna Senior RDCS Referring Phys: [id number] AVA SWAYZE IMPRESSIONS  1. Left ventricular ejection fraction, by estimation, is 55 to 60%. The left ventricle has normal function. The left ventricle has no regional wall motion abnormalities. There is moderate left ventricular hypertrophy. Left ventricular diastolic parameters are indeterminate.  2. Right ventricular systolic function is moderately reduced. The right ventricular size is mildly enlarged. There is mildly elevated pulmonary artery systolic pressure. The estimated right ventricular systolic pressure is 58.8 mmHg.  3. Left atrial size was mildly dilated.  4. Right atrial size was mildly dilated.  5. The mitral valve is degenerative. Mild mitral valve regurgitation. No evidence of mitral stenosis. Moderate mitral annular calcification.  6. Tricuspid valve regurgitation is severe.  7. The aortic  valve is tricuspid. Aortic valve regurgitation is not visualized. Aortic valve sclerosis/calcification is present, without any evidence of aortic stenosis.  8. The inferior vena cava is dilated in size with >50% respiratory variability, suggesting right atrial pressure of 8 mmHg. FINDINGS  Left Ventricle: Left ventricular ejection fraction, by estimation, is 55 to 60%. The left ventricle has normal function. The left ventricle has no regional wall motion abnormalities. The left ventricular  internal cavity size was normal in size. There is  moderate left ventricular hypertrophy. Left ventricular diastolic parameters are indeterminate. Right Ventricle: The right ventricular size is mildly enlarged. Right vetricular wall thickness was not well visualized. Right ventricular systolic function is moderately reduced. There is mildly elevated pulmonary artery systolic pressure. The tricuspid  regurgitant velocity is 2.66 m/s, and with an assumed right atrial pressure of 8 mmHg, the estimated right ventricular systolic pressure is 02.6 mmHg. Left Atrium: Left atrial size was mildly dilated. Right Atrium: Right atrial size was mildly dilated. Pericardium: There is no evidence of pericardial effusion. Mitral Valve: The mitral valve is degenerative in appearance. Moderate mitral annular calcification. Mild mitral valve regurgitation. No evidence of mitral valve stenosis. Tricuspid Valve: The tricuspid valve is normal in structure. Tricuspid valve regurgitation is severe. Aortic Valve: The aortic valve is tricuspid. Aortic valve regurgitation is not visualized. Aortic valve sclerosis/calcification is present, without any evidence of aortic stenosis. Pulmonic Valve: The pulmonic valve was not well visualized. Pulmonic valve regurgitation is trivial. Aorta: The aortic root and ascending aorta are structurally normal, with no evidence of dilitation. Venous: The inferior vena cava is dilated in size with greater than 50% respiratory variability, suggesting right atrial pressure of 8 mmHg. IAS/Shunts: The interatrial septum was not well visualized.  LEFT VENTRICLE PLAX 2D LVIDd:         3.50 cm LVIDs:         2.80 cm LV PW:         1.30 cm LV IVS:        1.30 cm LVOT diam:     1.80 cm LV SV:         30 LV SV Index:   16 LVOT Area:     2.54 cm  RIGHT VENTRICLE RV S prime:     5.33 cm/s TAPSE (M-mode): 1.2 cm LEFT ATRIUM             Index        RIGHT ATRIUM           Index LA diam:        3.40 cm 1.85 cm/m   RA Area:      22.70 cm LA Vol (A2C):   63.4 ml 34.51 ml/m  RA Volume:   69.40 ml  37.78 ml/m LA Vol (A4C):   59.5 ml 32.39 ml/m LA Biplane Vol: 65.9 ml 35.87 ml/m  AORTIC VALVE LVOT Vmax:   59.10 cm/s LVOT Vmean:  43.300 cm/s LVOT VTI:    0.117 m  AORTA Ao Root diam: 2.70 cm Ao Asc diam:  3.30 cm TRICUSPID VALVE TR Peak grad:   28.3 mmHg TR Vmax:        266.00 cm/s  SHUNTS Systemic VTI:  0.12 m Systemic Diam: 1.80 cm Oswaldo Milian MD Electronically signed by Oswaldo Milian MD Signature Date/Time: 05/12/2021/5:23:25 PM    Final     Cardiac Studies   TTE 05/12/21  1. Left ventricular ejection fraction, by estimation, is 55 to 60%. The  left ventricle has normal  function. The left ventricle has no regional  wall motion abnormalities. There is moderate left ventricular hypertrophy.  Left ventricular diastolic  parameters are indeterminate.   2. Right ventricular systolic function is moderately reduced. The right  ventricular size is mildly enlarged. There is mildly elevated pulmonary  artery systolic pressure. The estimated right ventricular systolic  pressure is 32.6 mmHg.   3. Left atrial size was mildly dilated.   4. Right atrial size was mildly dilated.   5. The mitral valve is degenerative. Mild mitral valve regurgitation. No  evidence of mitral stenosis. Moderate mitral annular calcification.   6. Tricuspid valve regurgitation is severe.   7. The aortic valve is tricuspid. Aortic valve regurgitation is not  visualized. Aortic valve sclerosis/calcification is present, without any  evidence of aortic stenosis.   8. The inferior vena cava is dilated in size with >50% respiratory  variability, suggesting right atrial pressure of 8 mmHg.   Patient Profile     88 y.o. female AF on Xarelto, CAD s/p CABG with LIMA to LAD and BMS RCA 2002, HTN, CKD Cr ~1.3-1.4 here with failure to thrive/lethargy/bilateral edema treated with diuretics, developed AKI, and bradycardia.  Assessment & Plan     Atrial fibrillation with slow ventricular rate/bradycardia:  Rates improved and off DPA/norepi.  Would hold rate control agents for now.  Will restart Xarelto.  No need for EP consult now.  AKI:  Cr up to 2.0, looks dry, consider IVF.  Acute on chronic diastolic dysfunction:   Hold lasix for now.  Looks euvolemic if not dry today.  Will consider Jardiance later.  CAD:  s/p LIMA to LAD and BMS RCA 2002:  No ASA in lieu of Xarelto.  Holding BB for now.  Cont statin.  HL:  Cont statin.  Hypotension:  Resolved.  GIB:  Erosive gastritis, ok for A/C.      For questions or updates, please contact [place] Please consult [email or web address] for contact info under        Signed, Early Osmond, MD  05/13/2021, 9:25 AM

## 2021-05-13 NOTE — Progress Notes (Signed)
CCM and cardiology both made aware of pts low urine output.

## 2021-05-13 NOTE — Progress Notes (Addendum)
NAME:  Sheila Morgan, MRN:  [id number], DOB:  1932/10/17, LOS: 3 ADMISSION DATE:  05/09/2021, CONSULTATION DATE:  05/11/2021 REFERRING MD:  Dr. Benny Lennert, CHIEF COMPLAINT:  bradycardia/ hypotension/AMS   History of Present Illness:   49 yoF with prior PMH who presented from SNF with concerns of severe BLE edema and lethargy admitted for acute on chronic heart failure.  Recent admits in December 2022 for COVID and HF exacerbation.   Lasix stopped at SNF on 1/5.    Admitted to [facility] with ongoing diuresis with albumin f/b lasix.  Noted to be in slow Afib on admit.  On 1/8, she became bradycardic, hypotensive, and with worsening mental status.  Cardiology and PCCM consulted for further management.   Pertinent  Medical History  Afib on xarelto, HFpEF, HTN, DMT2, CAD s/p CABG, possible dementia, hypothyroid  COVID 04/05/2021  Significant Hospital Events: Including procedures, antibiotic start and stop dates in addition to other pertinent events   1/6 admitted  1/8, she became bradycardic, hypotensive, and with worsening mental status.  Cardiology and PCCM consulted for further management.  1/9 no acute events overnight, changed from dobutamine to norepinephrine this a.m.  1/10 poor urine output over the last 24 to 48 hours seen creatinine however is no longer requiring pressors  Interim History / Subjective:  She denies any acute complaints this AM No acute events overnight, no further reported dark bowel movements  Objective   Blood pressure 106/71, pulse 78, temperature 97.9 F (36.6 C), temperature source Oral, resp. rate (!) 28, height 5' 6"  (1.676 m), weight 76.2 kg, SpO2 95 %.        Intake/Output Summary (Last 24 hours) at 05/13/2021 1829 Last data filed at 05/13/2021 0700 Gross per 24 hour  Intake 2272.67 ml  Output 103 ml  Net 2169.67 ml    Filed Weights   05/12/21 0412 05/12/21 1356 05/13/21 0338  Weight: 74.3 kg 74.3 kg 76.2 kg   Examination: General: Acute on chronic  ill-appearing elderly female lying in bed in no acute distress HEENT: [place]/AT, MM pink/moist, PERRL,  Neuro: Alert and oriented x2, confused to situation, able to follow commands CV: Irregularly irregular rate and rhythm, no murmur, rubs, or gallops,  PULM: Diminished air entry bilaterally, no increased work of breathing, no other added breath sounds, on room air GI: soft, bowel sounds active in all 4 quadrants, non-tender, non-distended, tolerating TF Extremities: warm/dry, generalized 1-2+ pitting lower extremity edema  Skin: no rashes or lesions  Resolved Hospital Problem list    Assessment & Plan:   Atrial fibrillation with slow ventricular rate/bradycardia -Started on dobutamine overnight with transition to norepinephrine morning of 1/9 Hypotension Acute on chronic HFpEF exacerbation (vs. Just low oncotic serum pressure with severe third spacing) -Echocardiogram 1/9 EF 55-60 with no regional wall motion abnormality, moderate LV hypertrophy, and moderately reduced RV function Hx CAD HTN P: Cardiology following, appreciate assistance Rate improved with Rate improved this a.m.  Both dobutamine and Levophed discontinued 1/9 EP consult per cardiology Home Cardizem and beta-blocker remain on hold IV heparin drip resumed post EGD Continuous telemetry  Encephalopathy correlating with low BP -Unsure if there is underlying dementia, this a.m. she is alert and oriented x2 with mild confusion to situation P: Neuroprotective measures Delirium precautions Aspiration precautions Mobilize as able Ensure sleep-wake cycle  Concern for upper GI bleed -Hemoglobin slight downtrend from 9.0 12/22-7.81/9 -Patient seen with melanic stool midmorning 1/9 this coupled with drop in the H&H prompted GI consult -IMA dynamics  improved with 1 unit PRBCs 1/9 P: Patient underwent EGD 1/9 with mild gastritis identified but no active bleeding Continue daily PPI Transfuse per protocol Home Xarelto  remains on hold IV heparin drip  Hypoalbuminemia - pending workup for multiple myeloma - low prealbumin P: Continue to supplement protein Dietary consult Encourage oral intake  AKI on CKD3a  -Creatinine 1.3 612/22 with up trended 1.741/9 P: Follow renal function urine output continues to be minimal with rising creatinine Ensure hemodynamics Follow renal function Monitor urine output Trend Bmet Avoid hepatotoxins  DMT2 P: Continue SSI CBG every AC at bedtime CBG goal 140-180  Frail elderly with FTT P: Daily engagement with family to determine adequate goals of care  Best Practice (right click and "Reselect all SmartList Selections" daily)   Diet/type: NPO DVT prophylaxis: not indicated GI prophylaxis: N/A Lines: N/A Foley:  N/A and Yes, and it is still needed Code Status:  full code Last date of multidisciplinary goals of care discussion daughter updated at bedside 1/9, will continue to daily update  Critical care: NA    Ryleigh Esqueda D. Kenton Kingfisher, NP-C [facility] Personal contact information can be found on Amion  05/13/2021, 7:14 AM

## 2021-05-13 NOTE — TOC Initial Note (Addendum)
Transition of Care [facility]) - Initial/Assessment Note    Patient Details  Name: Sheila Morgan MRN: [id number] Date of Birth: May 13, 1932  Transition of Care [facility]) CM/SW Contact:    Milas Gain, [place] Phone Number: 05/13/2021, 3:18 PM  Clinical Narrative:                      CSW received consult for possible SNF placement at time of discharge. Patient currently only oriented to self and place. CSW spoke with patients daughter Sheila Morgan regarding PT recommendation of SNF placement at time of discharge. Patients daughter reports patient comes from [facility] short term.Patients daughter expressed understanding of PT recommendation and is agreeable to SNF placement for patient at time of discharge. Patients daughter confirms she would like patient to return to [place] [facility]. Patients daughter gave CSW permission to fax out initial referral for patient to [facility].CSW discussed insurance authorization process with patients daughter.Patients daughter reports patient has received the COVID vaccines as well as 2 boosters.  No further questions reported at this time. CSW to continue to follow and assist with discharge planning needs.   Expected Discharge Plan: Skilled Nursing Facility Barriers to Discharge: Continued Medical Work up   Patient Goals and CMS Choice Patient states their goals for this hospitalization and ongoing recovery are:: SNF CMS Medicare.gov Compare Post Acute Care list provided to:: Patient Represenative (must comment) (Patients daughter Sheila Morgan) Choice offered to / list presented to : Adult Children (Patients daughter Sheila Morgan)  Expected Discharge Plan and Services Expected Discharge Plan: [place] In-house Referral: Clinical Social Work     Living arrangements for the past 2 months: [place] (from [facility])                                      Prior Living Arrangements/Services Living arrangements for the past  2 months: [place] (from [facility] short [facility]) Lives with::  (From [facility] short term) Patient language and need for interpreter reviewed:: Yes Do you feel safe going back to the place where you live?: No   SNF  Need for Family Participation in Patient Care: Yes (Comment) Care giver support system in place?: Yes (comment)   Criminal Activity/Legal Involvement Pertinent to Current Situation/Hospitalization: No - Comment as needed  Activities of Daily Living Home Assistive Devices/Equipment: Gilford Rile (specify type) ADL Screening (condition at time of admission) Patient's cognitive ability adequate to safely complete daily activities?: Yes Is the patient deaf or have difficulty hearing?: Yes Does the patient have difficulty seeing, even when wearing glasses/contacts?: No Does the patient have difficulty concentrating, remembering, or making decisions?: Yes Patient able to express need for assistance with ADLs?: Yes Does the patient have difficulty dressing or bathing?: Yes Independently performs ADLs?: No Does the patient have difficulty walking or climbing stairs?: Yes Weakness of Legs: Both Weakness of Arms/Hands: Both  Permission Sought/Granted Permission sought to share information with : Case Manager, Customer service manager, Family Supports Permission granted to share information with : No  Share Information with NAME: Patient currently only oriented to self and place  Permission granted to share info w AGENCY: Patient currently only oriented to self and place /SNF  Permission granted to share info w Relationship: Patient currently only oriented to self and place/daughter  Permission granted to share info w Contact Information: Patient currently only oriented to self and  place Sheila Morgan [phone]  Emotional Assessment       Orientation: : Oriented to Self, Oriented to Place Alcohol / Substance Use: Not Applicable Psych Involvement: No  (comment)  Admission diagnosis:  Anasarca [R60.1] Hypoalbuminemia [E88.09] Acute on chronic diastolic CHF (congestive heart failure) (HCC) [I50.33] Acute on chronic congestive heart failure, unspecified heart failure type [facility] [I50.9] Patient Active Problem List   Diagnosis Date Noted   Melena    Bradycardia 05/11/2021   Hypotension 05/11/2021   Hypoalbuminemia 05/09/2021   Acute on chronic diastolic CHF (congestive heart failure) ([place]) 05/09/2021   Acute respiratory failure with hypoxia ([place]) 04/19/2021   Acute encephalopathy 04/19/2021   Acute renal failure superimposed on stage 3a chronic kidney disease ([place]) 04/19/2021   Coagulopathy ([place]) 04/19/2021   SIRS (systemic inflammatory response syndrome) ([place]) 04/19/2021   COVID 04/05/2021   Abrasion of right hand 01/13/2021   A-fib ([place]) 12/31/2020   Foot swelling 09/10/2020   Calcium pyrophosphate arthropathy of multiple sites 05/14/2020   Small vessel disease ([place]) 02/12/2020   Left leg DVT ([place]), 12/15/19 12/15/2019   Urinary frequency 02/22/2019   Fall 07/14/2018   Bilateral hearing loss 01/05/2018   Memory difficulties 01/05/2018   Multiple closed fractures of ribs of right side 01/19/2017   Acute pain of right shoulder 01/05/2017   Acute pain of right knee 01/05/2017   B12 deficiency 06/13/2015   Hypothyroidism 06/13/2015   DM (diabetes mellitus) type II, controlled, with peripheral vascular disorder ([place]) 03/06/2014   Gout 10/15/2013   Arthritis    Hiatal hernia    Coronary artery disease    Left knee DJD    Benign paroxysmal positional vertigo 05/19/2013   Weakness generalized 05/09/2013   Pseudogout of left knee 03/29/2013   Acute right lumbar radiculopathy 03/18/2013   Shoulder impingement syndrome 01/25/2013   Vertigo 10/17/2012   Renal insufficiency, mild 06/20/2011   Essential hypertension 12/31/2009   Osteoporosis - Dr Kathlene November 12/31/2009   SKIN CANCER, HX OF 12/31/2009   Proteinuria 01/05/2008    Normocytic anemia 12/21/2007   Hyperlipidemia 09/08/2006   Polymyalgia rheumatica ([place]) 09/08/2006   CORONARY ARTERY BYPASS GRAFT, HX OF 09/08/2006   PCP:  Binnie Rail, MD Pharmacy:  No Pharmacies Listed    Social Determinants of Health (SDOH) Interventions    Readmission Risk Interventions No flowsheet data found.

## 2021-05-13 NOTE — TOC Benefit Eligibility Note (Signed)
Patient Teacher, English as a foreign language completed.    The patient is currently admitted and upon discharge could be taking Eliquis 5 mg.  The current 30 day co-pay is, $47.00.   The patient is currently admitted and upon discharge could be taking Xarelto 20 mg.  The current 30 day co-pay is, $47.00.   The patient is insured through [place], [place] Patient Advocate Specialist [place] Patient Advocate Team Direct Number: [phone]  Fax: [phone]

## 2021-05-13 NOTE — Progress Notes (Signed)
PCCM progress note  Patient's daughter provided additional history today on visit.  She stated her mother was on chronically on prednisone for many years and was wondering if this should be restarted.  She was unable to provide details regarding dose duration or rationale for use.  The daughter thinks that patient rheumatologist started prednisone but she is unsure.  Quick chart review of this facility records and Care Every Where,did not reveal notes from rheumatology.  Our medical records indicates that patients 5 mg prednisone daily was stopped upon discharge from this facility 04/17/2021.  Spoke with skilled facility and they also cconfirm last dose of prednisone was 04/17/2021  Patient was readmitted to this facility 1217 and remained hospitalized until 1222 and no prednisone therapy was provided upon discharge.   Patient remains hemodynamically stable and pressor support was stopped 1/9.  No acute concern for adrenal insufficiency at this time.  Will not resume prednisone therapy at this time  Sarrah Fiorenza D. Kenton Kingfisher, NP-C [facility] Personal contact information can be found on Amion  05/13/2021, 4:28 PM

## 2021-05-13 NOTE — Progress Notes (Signed)
Per daughter, pt has not walked in about a month since the pt was hospitalized with COVID.

## 2021-05-13 NOTE — Progress Notes (Signed)
eLink Physician-Brief Progress Note Patient Name: Sheila Morgan DOB: 1932/11/22 MRN: [id number]   Date of Service  05/13/2021  HPI/Events of Note  Mg++ 1.5, Only 58 ml of urine out put during the shift.  eICU Interventions  Magnesium replaced per electrolyte replacement protocol, Lasix 20 mg iv x 1 ordered for oliguria.        [name] [name] 05/13/2021, 4:32 AM

## 2021-05-13 NOTE — Evaluation (Addendum)
Physical Therapy Evaluation Patient Details Name: Sheila Morgan MRN: [id number] DOB: 1932/07/13 Today's Date: 05/13/2021  History of Present Illness  88 yo female adm 1/6 from SNF with acute on chronic CHF. Pt developed bradycardia and HTN on 1/8. Pt with decline in Hgb and concern for upper GI bleed. EGD 1/9 showed gatritis but no active bleeding. PMH - covid, CAD, CABG, DM, afib, hypothyroidism, history of DVT, CKD  Clinical Impression  Pt presents to PT with decreased mobility due to decreased strength, decreased balance, and decreased functional activity tolerance. Pt has been at [facility] for rehab since hospitalization in Dec and recommend return there for further rehab.         Recommendations for follow up therapy are one component of a multi-disciplinary discharge planning process, led by the attending physician.  Recommendations may be updated based on patient status, additional functional criteria and insurance authorization.  Follow Up Recommendations Skilled nursing-short term rehab (<3 hours/day)    Assistance Recommended at Discharge Frequent or constant Supervision/Assistance  Patient can return home with the following  Other (comment);A lot of help with bathing/dressing/bathroom;Two people to help with walking and/or transfers (Use of mechanical lift for OOB)    Equipment Recommendations Wheelchair (measurements PT);Wheelchair cushion (measurements PT);Other (comment);Hospital bed (hoyer lift)  Recommendations for Other Services       Functional Status Assessment Patient has had a recent decline in their functional status and/or demonstrates limited ability to make significant improvements in function in a reasonable and predictable amount of time     Precautions / Restrictions Precautions Precautions: Fall      Mobility  Bed Mobility Overal bed mobility: Needs Assistance Bed Mobility: Rolling;Supine to Sit;Sit to Supine Rolling: Min assist   Supine to sit: Max  assist;HOB elevated Sit to supine: Max assist;+2 for safety/equipment   General bed mobility comments: Max A for bringing hips towards EOB and then elevating trunk. Max A +2 for managing BLEs and then returning to supine. Min A for rolling with increased cues    Transfers Overall transfer level: Needs assistance Equipment used: 2 person hand held assist Transfers: Sit to/from Stand;Bed to chair/wheelchair/BSC Sit to Stand: Total assist;+2 physical assistance           General transfer comment: +2 Total assist using bed pad under pt's hips to bring pt to partial stand with buttocks ~12" off of bed but knees, hips, and trunk flexed Transfer via Lift Equipment: [place]  Ambulation/Gait                  Stairs            Wheelchair Mobility    Modified Rankin (Stroke Patients Only)       Balance Overall balance assessment: Needs assistance Sitting-balance support: No upper extremity supported;Feet supported Sitting balance-Leahy Scale: Fair Sitting balance - Comments: Sat EOB x 15 minutes   Standing balance support: Bilateral upper extremity supported;During functional activity Standing balance-Leahy Scale: Zero                               Pertinent Vitals/Pain Pain Assessment: Faces Faces Pain Scale: Hurts little more Pain Location: R knee Pain Descriptors / Indicators: Aching;Grimacing Pain Intervention(s): Limited activity within patient's tolerance;Repositioned    Home Living Family/patient expects to be discharged to:: Skilled nursing facility                   Additional Comments: Was  at [facility]    Prior Function Prior Level of Function : Needs assist             Mobility Comments: During prior admission last month pt was +2 max assist to stand. Prior to hospitalization 04/07/21 pt was living alone ADLs Comments: Poor historian. Stating she performed all ADLs without any help     Hand Dominance         Extremity/Trunk Assessment   Upper Extremity Assessment Upper Extremity Assessment: Defer to OT evaluation    Lower Extremity Assessment Lower Extremity Assessment: RLE deficits/detail;LLE deficits/detail RLE Deficits / Details: Strength grossly 2+/5 LLE Deficits / Details: Strength grossly 2+/5    Cervical / Trunk Assessment Cervical / Trunk Assessment: Kyphotic  Communication   Communication: HOH  Cognition Arousal/Alertness: Awake/alert Behavior During Therapy: WFL for tasks assessed/performed Overall Cognitive Status: History of cognitive impairments - at baseline                                 General Comments: Dementia at baseline. Following simple commands. increased time. Oriented to self        General Comments General comments (skin integrity, edema, etc.): VSS on RA. RN present during session.    Exercises     Assessment/Plan    PT Assessment Patient needs continued PT services  PT Problem List Decreased strength;Decreased activity tolerance;Decreased balance;Decreased mobility       PT Treatment Interventions DME instruction;Gait training;Therapeutic activities;Therapeutic exercise;Patient/family education;Balance training;Functional mobility training    PT Goals (Current goals can be found in the Care Plan section)  Acute Rehab PT Goals Patient Stated Goal: not stated PT Goal Formulation: With patient Time For Goal Achievement: 05/27/21 Potential to Achieve Goals: Fair    Frequency Min 2X/week     Co-evaluation PT/OT/SLP Co-Evaluation/Treatment: Yes Reason for Co-Treatment: For patient/therapist safety PT goals addressed during session: Mobility/safety with mobility;Balance OT goals addressed during session: ADL's and self-care       AM-PAC PT "6 Clicks" Mobility  Outcome Measure Help needed turning from your back to your side while in a flat bed without using bedrails?: A Little Help needed moving from lying on your back to  sitting on the side of a flat bed without using bedrails?: A Lot Help needed moving to and from a bed to a chair (including a wheelchair)?: Total Help needed standing up from a chair using your arms (e.g., wheelchair or bedside chair)?: Total Help needed to walk in hospital room?: Total Help needed climbing 3-5 steps with a railing? : Total 6 Click Score: 9    End of Session   Activity Tolerance: Patient tolerated treatment well Patient left: in chair;with call bell/phone within reach;with chair alarm set Nurse Communication: Mobility status;Need for lift equipment PT Visit Diagnosis: Muscle weakness (generalized) (M62.81);Difficulty in walking, not elsewhere classified (R26.2)    Time: 2330-0762 PT Time Calculation (min) (ACUTE ONLY): 31 min   Charges:   PT Evaluation $PT Eval Moderate Complexity: [place] Pager [phone] Office [place] 05/13/2021, 12:17 PM

## 2021-05-13 NOTE — NC FL2 (Signed)
[place] MEDICAID FL2 LEVEL OF CARE SCREENING TOOL     IDENTIFICATION  Patient Name: Sheila Morgan Birthdate: 10/05/1932 Sex: female Admission Date (Current Location): 05/09/2021  [facility] and [place] Number:  Herbalist and Address:  [facility] [place], [place] [address] [place], [place] [address]      Provider Number: [id number]  Attending Physician Name and Address:  Julian Hy, DO  Relative Name and Phone Number:  [name] [phone]    Current Level of Care: Hospital Recommended Level of Care: [place] Prior Approval Number:    Date Approved/Denied:   PASRR Number: [id number]  Discharge Plan: SNF    Current Diagnoses: Patient Active Problem List   Diagnosis Date Noted   Melena    Bradycardia 05/11/2021   Hypotension 05/11/2021   Hypoalbuminemia 05/09/2021   Acute on chronic diastolic CHF (congestive heart failure) ([place]) 05/09/2021   Acute respiratory failure with hypoxia ([place]) 04/19/2021   Acute encephalopathy 04/19/2021   Acute renal failure superimposed on stage 3a chronic kidney disease ([place]) 04/19/2021   Coagulopathy ([place]) 04/19/2021   SIRS (systemic inflammatory response syndrome) ([place]) 04/19/2021   COVID 04/05/2021   Abrasion of right hand 01/13/2021   A-fib ([place]) 12/31/2020   Foot swelling 09/10/2020   Calcium pyrophosphate arthropathy of multiple sites 05/14/2020   Small vessel disease ([place]) 02/12/2020   Left leg DVT ([place]), 12/15/19 12/15/2019   Urinary frequency 02/22/2019   Fall 07/14/2018   Bilateral hearing loss 01/05/2018   Memory difficulties 01/05/2018   Multiple closed fractures of ribs of right side 01/19/2017   Acute pain of right shoulder 01/05/2017   Acute pain of right knee 01/05/2017   B12 deficiency 06/13/2015   Hypothyroidism 06/13/2015   DM (diabetes mellitus) type II, controlled, with peripheral vascular disorder ([place]) 03/06/2014   Gout 10/15/2013   Arthritis    Hiatal hernia     Coronary artery disease    Left knee DJD    Benign paroxysmal positional vertigo 05/19/2013   Weakness generalized 05/09/2013   Pseudogout of left knee 03/29/2013   Acute right lumbar radiculopathy 03/18/2013   Shoulder impingement syndrome 01/25/2013   Vertigo 10/17/2012   Renal insufficiency, mild 06/20/2011   Essential hypertension 12/31/2009   Osteoporosis - Dr Kathlene November 12/31/2009   SKIN CANCER, HX OF 12/31/2009   Proteinuria 01/05/2008   Normocytic anemia 12/21/2007   Hyperlipidemia 09/08/2006   Polymyalgia rheumatica ([place]) 09/08/2006   CORONARY ARTERY BYPASS GRAFT, HX OF 09/08/2006    Orientation RESPIRATION BLADDER Height & Weight     Self, Place  O2 (Nasal Cannula 3 liters) Incontinent (Urethral Catheter double- lumen 16 Fr.) Weight: 168 lb (76.2 kg) Height:  5\' 6"  (167.6 cm)  BEHAVIORAL SYMPTOMS/MOOD NEUROLOGICAL BOWEL NUTRITION STATUS      Incontinent Diet (Please see discharge summary)  AMBULATORY STATUS COMMUNICATION OF NEEDS Skin   Independent (able to feed self) Verbally Other (Comment) (dry,flaky,ecchymosis circumferential,bilateral,PI sacrum,lower,posterior,unstageable,foam lift dressing,changed,every 3 days)                       Personal Care Assistance Level of Assistance  Bathing, Feeding, Dressing Bathing Assistance: Limited assistance Feeding assistance: Independent (able to feed self) Dressing Assistance: Limited assistance     Functional Limitations Info  Sight, Hearing, Speech Sight Info: Impaired Hearing Info: Impaired Speech Info: Adequate    SPECIAL CARE FACTORS FREQUENCY  PT (By licensed PT), OT (By licensed OT)     PT Frequency: 5x  min weekly OT Frequency: 5x min weekly            Contractures Contractures Info: Not present    Additional Factors Info  Code Status, Insulin Sliding Scale Code Status Info: FULL Allergies Info: No Known Allergies   Insulin Sliding Scale Info: insulin aspart (novoLOG) injection 0-5 Units daily at  bedtime,insulin aspart (novoLOG) injection 0-9 Units 3 times daily with meals       Current Medications (05/13/2021):  This is the current hospital active medication list Current Facility-Administered Medications  Medication Dose Route Frequency Provider Last Rate Last Admin   0.9 %  sodium chloride infusion  250 mL Intravenous PRN Swayze, Ava, DO       0.9 %  sodium chloride infusion  250 mL Intravenous Continuous Candee Furbish, MD   Stopped at 05/12/21 1959   acetaminophen (TYLENOL) tablet 650 mg  650 mg Oral Q4H PRN Swayze, Ava, DO   650 mg at 05/11/21 1359   allopurinol (ZYLOPRIM) tablet 100 mg  100 mg Oral Q M,W,F Swayze, Ava, DO   100 mg at 05/12/21 0905   apixaban (ELIQUIS) tablet 2.5 mg  2.5 mg Oral BID Julian Hy, DO   2.5 mg at 05/13/21 1307   calcium-vitamin D (OSCAL WITH D) 500-5 MG-MCG per tablet 1 tablet  1 tablet Oral BID Swayze, Ava, DO   1 tablet at 05/13/21 1122   Chlorhexidine Gluconate Cloth 2 % PADS 6 each  6 each Topical Daily Candee Furbish, MD   6 each at 05/12/21 1000   docusate sodium (COLACE) capsule 200 mg  200 mg Oral Daily Swayze, Ava, DO   200 mg at 05/13/21 1122   ferrous gluconate (FERGON) tablet 324 mg  324 mg Oral Q breakfast Swayze, Ava, DO   324 mg at 05/13/21 4818   Gerhardt's butt cream   Topical BID Julian Hy, DO   Given at 05/13/21 1000   insulin aspart (novoLOG) injection 0-5 Units  0-5 Units Subcutaneous QHS Swayze, Ava, DO       insulin aspart (novoLOG) injection 0-9 Units  0-9 Units Subcutaneous TID WC Swayze, Ava, DO   1 Units at 05/11/21 1824   levothyroxine (SYNTHROID) tablet 50 mcg  50 mcg Oral Q0600 Swayze, Ava, DO   50 mcg at 05/13/21 0502   multivitamin with minerals tablet 1 tablet  1 tablet Oral Daily Swayze, Ava, DO   1 tablet at 05/13/21 1122   nystatin (MYCOSTATIN/NYSTOP) topical powder 1 application  1 application Topical Daily Swayze, Ava, DO   1 application at 56/31/49 0907   ondansetron (ZOFRAN) injection 4 mg  4 mg  Intravenous Q6H PRN Swayze, Ava, DO       pantoprazole (PROTONIX) injection 40 mg  40 mg Intravenous QHS Noemi [place] P, DO   40 mg at 05/12/21 2142   simvastatin (ZOCOR) tablet 40 mg  40 mg Oral QHS Swayze, Ava, DO   40 mg at 05/12/21 2142   sodium chloride flush (NS) 0.9 % injection 3 mL  3 mL Intravenous Q12H Swayze, Ava, DO   3 mL at 05/12/21 2143   sodium chloride flush (NS) 0.9 % injection 3 mL  3 mL Intravenous PRN Swayze, Ava, DO         Discharge Medications: Please see discharge summary for a list of discharge medications.  Relevant Imaging Results:  Relevant Lab Results:   Additional Information [phone] Both Covid Vaccines and [place], LCSWA

## 2021-05-13 NOTE — Plan of Care (Signed)
°  Problem: Pain Managment: Goal: General experience of comfort will improve Outcome: Progressing   Problem: Safety: Goal: Ability to remain free from injury will improve Outcome: Progressing   Problem: Activity: Goal: Risk for activity intolerance will decrease Outcome: Not Progressing   Problem: Nutrition: Goal: Adequate nutrition will be maintained Outcome: Not Progressing

## 2021-05-13 NOTE — Evaluation (Signed)
Occupational Therapy Evaluation Patient Details Name: Sheila Morgan MRN: [id number] DOB: 06-Jan-1933 Today's Date: 05/13/2021   History of Present Illness 88 yo female adm 1/6 from SNF with acute on chronic CHF. Pt developed bradycardia and HTN on 1/8. Pt with decline in Hgb and concern for upper GI bleed. EGD 1/9 showed gatritis but no active bleeding. PMH - covid, CAD, CABG, DM, afib, hypothyroidism, history of DVT, CKD   Clinical Impression   PTA, per chart review, pt was at [facility] and received assistance for ADLs and mobility. Pt poor historian with baseline dementia and reports she lived home alone and was independent. Pt currently requiring Mod A for UB ADLs, Max A +2 for LB ADLs, and Max-Total A +2 for functional transfers. Use of maxi-sky to lift patient out of bed. VSS on RA. Of note, bed controls not working - Chiropodist. Pt would benefit from further acute OT to facilitate safe dc. Recommend dc to SNF for further OT to optimize safety, independence with ADLs, and return to PLOF.      Recommendations for follow up therapy are one component of a multi-disciplinary discharge planning process, led by the attending physician.  Recommendations may be updated based on patient status, additional functional criteria and insurance authorization.   Follow Up Recommendations  Skilled nursing-short term rehab (<3 hours/day)    Assistance Recommended at Discharge Frequent or constant Supervision/Assistance  Patient can return home with the following Two people to help with bathing/dressing/bathroom;Two people to help with walking and/or transfers    Functional Status Assessment  Patient has had a recent decline in their functional status and demonstrates the ability to make significant improvements in function in a reasonable and predictable amount of time.  Equipment Recommendations  Other (comment) (Defer to next venue)    Recommendations for Other Services PT consult      Precautions / Restrictions Precautions Precautions: Fall      Mobility Bed Mobility Overal bed mobility: Needs Assistance Bed Mobility: Rolling;Supine to Sit;Sit to Supine Rolling: Min assist   Supine to sit: Max assist;HOB elevated Sit to supine: Max assist;+2 for safety/equipment   General bed mobility comments: Max A for bringing hips towards EOB and then elevating trunk. Max A +2 for managing BLEs and then returning to supine. Min A for rolling with increased cues    Transfers Overall transfer level: Needs assistance Equipment used: 2 person hand held assist Transfers: Sit to/from Stand;Bed to chair/wheelchair/BSC Sit to Stand: Total assist;+2 physical assistance           General transfer comment: Total A +2 for attempt sit<>stand. Use of lift Max sky for OOB to recliner Transfer via Lift Equipment: [place] Overall balance assessment: Needs assistance Sitting-balance support: No upper extremity supported;Feet supported Sitting balance-Leahy Scale: Fair     Standing balance support: Bilateral upper extremity supported;During functional activity Standing balance-Leahy Scale: Zero                             ADL either performed or assessed with clinical judgement   ADL Overall ADL's : Needs assistance/impaired Eating/Feeding: Set up;Sitting   Grooming: Minimal assistance;Sitting   Upper Body Bathing: Moderate assistance;Sitting   Lower Body Bathing: Maximal assistance;+2 for physical assistance;Bed level;Sitting/lateral leans   Upper Body Dressing : Moderate assistance;Sitting   Lower Body Dressing: Maximal assistance;+2 for safety/equipment;+2 for physical assistance;Bed level;Sitting/lateral leans   Toilet Transfer: Total assistance  Functional mobility during ADLs: Maximal assistance;Total assistance;+2 for physical assistance General ADL Comments: Max cues due to baseline dementia     Vision Baseline  Vision/History: 1 Wears glasses       Perception     Praxis      Pertinent Vitals/Pain Pain Assessment: Faces Faces Pain Scale: Hurts little more Pain Location: R knee Pain Descriptors / Indicators: Aching;Grimacing Pain Intervention(s): Limited activity within patient's tolerance;Monitored during session;Repositioned     Hand Dominance     Extremity/Trunk Assessment Upper Extremity Assessment Upper Extremity Assessment: RUE deficits/detail;LUE deficits/detail RUE Deficits / Details: edema and limited ROM/strength RUE Coordination: decreased fine motor;decreased gross motor LUE Deficits / Details: edema and limited ROM/strength LUE Coordination: decreased fine motor;decreased gross motor   Lower Extremity Assessment Lower Extremity Assessment: Defer to PT evaluation   Cervical / Trunk Assessment Cervical / Trunk Assessment: Kyphotic   Communication Communication Communication: HOH   Cognition Arousal/Alertness: Awake/alert Behavior During Therapy: WFL for tasks assessed/performed Overall Cognitive Status: History of cognitive impairments - at baseline                                 General Comments: Dementia at baseline. Following simple commands. increased time. Oriented to self     General Comments  VSS on RA. RN present during session.    Exercises     Shoulder Instructions      Home Living Family/patient expects to be discharged to:: Skilled nursing facility                                 Additional Comments: Was at [facility]      Prior Functioning/Environment Prior Level of Function : Needs assist               ADLs Comments: Poor historian. Stating she performed all ADLs without any help        OT Problem List: Decreased strength;Cardiopulmonary status limiting activity;Decreased cognition;Decreased activity tolerance;Decreased safety awareness;Impaired balance (sitting and/or standing);Decreased knowledge of  use of DME or AE;Decreased knowledge of precautions;Impaired UE functional use      OT Treatment/Interventions: Self-care/ADL training;Therapeutic exercise;Therapeutic activities;Cognitive remediation/compensation;Energy conservation;Patient/family education;DME and/or AE instruction;Balance training    OT Goals(Current goals can be found in the care plan section) Acute Rehab OT Goals Patient Stated Goal: Get better OT Goal Formulation: With patient Time For Goal Achievement: 05/27/21 Potential to Achieve Goals: Good ADL Goals Pt Will Perform Upper Body Dressing: with min assist;sitting Pt Will Perform Lower Body Dressing: with mod assist;sit to/from stand Pt Will Transfer to Toilet: with mod assist;with +2 assist;stand pivot transfer;bedside commode Additional ADL Goal #1: Pt will perform bed mobility with Min A +2 in prepation for ADLs  OT Frequency: Min 2X/week    Co-evaluation PT/OT/SLP Co-Evaluation/Treatment: Yes Reason for Co-Treatment: For patient/therapist safety;To address functional/ADL transfers   OT goals addressed during session: ADL's and self-care      AM-PAC OT "6 Clicks" Daily Activity     Outcome Measure Help from another person eating meals?: A Little Help from another person taking care of personal grooming?: A Little Help from another person toileting, which includes using toliet, bedpan, or urinal?: A Lot Help from another person bathing (including washing, rinsing, drying)?: A Lot Help from another person to put on and taking off regular upper body clothing?: A Little Help from another person to  put on and taking off regular lower body clothing?: A Lot 6 Click Score: 15   End of Session Nurse Communication: Mobility status  Activity Tolerance: Patient tolerated treatment well Patient left: in chair;with call bell/phone within reach;with chair alarm set  OT Visit Diagnosis: Unsteadiness on feet (R26.81);Other symptoms and signs involving cognitive  function;History of falling (Z91.81);Muscle weakness (generalized) (M62.81)                Time: 1674-2552 OT Time Calculation (min): 34 min Charges:  OT General Charges $OT Visit: 1 Visit OT Evaluation $OT Eval Moderate Complexity: [place], OTR/L Acute Rehab Pager: [phone] Office: [place] 05/13/2021, 10:44 AM

## 2021-05-13 NOTE — Progress Notes (Signed)
ANTICOAGULATION CONSULT NOTE - Follow Up Consult  Pharmacy Consult for heparin Indication: atrial fibrillation  Labs: Recent Labs    05/11/21 1622 05/11/21 1820 05/11/21 1839 05/12/21 0116 05/12/21 0956 05/12/21 1631 05/13/21 0115 05/13/21 0406 05/13/21 0410  HGB  --  8.1*   < > 7.8*  --  10.8*  --  11.7*  --   HCT  --  26.4*   < > 25.5*  --  32.9*  --  36.3  --   PLT  --  210  --  179  --  190  --  181  --   APTT  --   --   --   --  54*  --   --   --  73*  LABPROT  --   --   --   --  22.2*  --   --   --   --   INR  --   --   --   --  1.9*  --   --   --   --   HEPARINUNFRC  --   --   --   --   --   --   --   --  >1.10*  CREATININE  --   --   --  1.74*  --  1.88* 2.03*  --   --   TROPONINIHS 17 17  --   --   --   --   --   --   --    < > = values in this interval not displayed.    Assessment/Plan:  88yo female therapeutic on heparin after resuming. Will continue infusion at current rate of 1050 units/hr and confirm stable with additional PTT.   Wynona Neat, PharmD, BCPS  05/13/2021,6:09 AM

## 2021-05-14 DIAGNOSIS — E440 Moderate protein-calorie malnutrition: Secondary | ICD-10-CM | POA: Diagnosis present

## 2021-05-14 DIAGNOSIS — I5033 Acute on chronic diastolic (congestive) heart failure: Secondary | ICD-10-CM | POA: Diagnosis not present

## 2021-05-14 LAB — PROTEIN ELECTROPHORESIS, SERUM
A/G Ratio: 0.7 (ref 0.7–1.7)
Albumin ELP: 1.9 g/dL — ABNORMAL LOW (ref 2.9–4.4)
Alpha-1-Globulin: 0.3 g/dL (ref 0.0–0.4)
Alpha-2-Globulin: 0.5 g/dL (ref 0.4–1.0)
Beta Globulin: 1 g/dL (ref 0.7–1.3)
Gamma Globulin: 0.8 g/dL (ref 0.4–1.8)
Globulin, Total: 2.6 g/dL (ref 2.2–3.9)
Total Protein ELP: 4.5 g/dL — ABNORMAL LOW (ref 6.0–8.5)

## 2021-05-14 LAB — BASIC METABOLIC PANEL
Anion gap: 8 (ref 5–15)
BUN: 25 mg/dL — ABNORMAL HIGH (ref 8–23)
CO2: 21 mmol/L — ABNORMAL LOW (ref 22–32)
Calcium: 8.7 mg/dL — ABNORMAL LOW (ref 8.9–10.3)
Chloride: 105 mmol/L (ref 98–111)
Creatinine, Ser: 2.21 mg/dL — ABNORMAL HIGH (ref 0.44–1.00)
GFR, Estimated: 21 mL/min — ABNORMAL LOW (ref >60)
Glucose, Bld: 111 mg/dL — ABNORMAL HIGH (ref 70–99)
Potassium: 4.1 mmol/L (ref 3.5–5.1)
Sodium: 134 mmol/L — ABNORMAL LOW (ref 135–145)

## 2021-05-14 LAB — MAGNESIUM: Magnesium: 2.8 mg/dL — ABNORMAL HIGH (ref 1.7–2.4)

## 2021-05-14 LAB — GLUCOSE, CAPILLARY
Glucose-Capillary: 99 mg/dL (ref 70–99)
Glucose-Capillary: 120 mg/dL — ABNORMAL HIGH (ref 70–99)
Glucose-Capillary: 92 mg/dL (ref 70–99)
Glucose-Capillary: 112 mg/dL — ABNORMAL HIGH (ref 70–99)

## 2021-05-14 LAB — CBC
HCT: 31.9 % — ABNORMAL LOW (ref 36.0–46.0)
Hemoglobin: 10.3 g/dL — ABNORMAL LOW (ref 12.0–15.0)
MCH: 30.1 pg (ref 26.0–34.0)
MCHC: 32.3 g/dL (ref 30.0–36.0)
MCV: 93.3 fL (ref 80.0–100.0)
Platelets: 184 10*3/uL (ref 150–400)
RBC: 3.42 MIL/uL — ABNORMAL LOW (ref 3.87–5.11)
RDW: 16.3 % — ABNORMAL HIGH (ref 11.5–15.5)
WBC: 5.8 10*3/uL (ref 4.0–10.5)
nRBC: 0 % (ref 0.0–0.2)

## 2021-05-14 MED ORDER — PANTOPRAZOLE SODIUM 40 MG PO TBEC
40.0000 mg | DELAYED_RELEASE_TABLET | Freq: Every day | ORAL | Status: DC
  Administered 2021-05-14 – 2021-05-19 (×5): 40 mg via ORAL
  Filled 2021-05-14 (×6): qty 1

## 2021-05-14 MED ORDER — SODIUM CHLORIDE 0.9 % IV SOLN: INTRAVENOUS | Status: DC

## 2021-05-14 NOTE — Plan of Care (Signed)
°  Problem: Education: Goal: Knowledge of General Education information will improve Description: Including pain rating scale, medication(s)/side effects and non-pharmacologic comfort measures Outcome: Progressing   Problem: Clinical Measurements: Goal: Respiratory complications will improve Outcome: Progressing   Problem: Activity: Goal: Risk for activity intolerance will decrease Outcome: Progressing   Problem: Nutrition: Goal: Adequate nutrition will be maintained Outcome: Progressing   Problem: Pain Managment: Goal: General experience of comfort will improve Outcome: Progressing   Problem: Skin Integrity: Goal: Risk for impaired skin integrity will decrease Outcome: Progressing

## 2021-05-14 NOTE — Discharge Instructions (Signed)
Information on my medicine - ELIQUIS® (apixaban) ° °This medication education was reviewed with me or my healthcare representative as part of my discharge preparation.  The pharmacist that spoke with me during my hospital stay was:  Jashira Cotugno Rhea, RPH ° °Why was Eliquis® prescribed for you? °Eliquis® was prescribed for you to reduce the risk of a blood clot forming that can cause a stroke if you have a medical condition called atrial fibrillation (a type of irregular heartbeat). ° °What do You need to know about Eliquis® ? °Take your Eliquis® TWICE DAILY - one tablet in the morning and one tablet in the evening with or without food. If you have difficulty swallowing the tablet whole please discuss with your pharmacist how to take the medication safely. ° °Take Eliquis® exactly as prescribed by your doctor and DO NOT stop taking Eliquis® without talking to the doctor who prescribed the medication.  Stopping may increase your risk of developing a stroke.  Refill your prescription before you run out. ° °After discharge, you should have regular check-up appointments with your healthcare provider that is prescribing your Eliquis®.  In the future your dose may need to be changed if your kidney function or weight changes by a significant amount or as you get older. ° °What do you do if you miss a dose? °If you miss a dose, take it as soon as you remember on the same day and resume taking twice daily.  Do not take more than one dose of ELIQUIS at the same time to make up a missed dose. ° °Important Safety Information °A possible side effect of Eliquis® is bleeding. You should call your healthcare provider right away if you experience any of the following: °  Bleeding from an injury or your nose that does not stop. °  Unusual colored urine (red or dark brown) or unusual colored stools (red or black). °  Unusual bruising for unknown reasons. °  A serious fall or if you hit your head (even if there is no  bleeding). ° °Some medicines may interact with Eliquis® and might increase your risk of bleeding or clotting while on Eliquis®. To help avoid this, consult your healthcare provider or pharmacist prior to using any new prescription or non-prescription medications, including herbals, vitamins, non-steroidal anti-inflammatory drugs (NSAIDs) and supplements. ° °This website has more information on Eliquis® (apixaban): http://www.eliquis.com/eliquis/home ° °

## 2021-05-14 NOTE — Consult Note (Addendum)
° °  [facility] CM Inpatient Consult   05/14/2021  Sheila Morgan [birth year] [id number]  [facility] [ACO] Patient: Medicare   Primary Care Provider:  Binnie Rail, MD [place] is an embedded provider with a Chronic Care Management team and program, and is listed for the transition of care follow up and appointments.  Addendum:  Patient is active noted in encounters with Embedded CCM with pharmacist.  Patient was screened for Embedded practice service needs for chronic care management  Plan: Notification to be sent to the [facility] RN if patient transitions to a [facility] SNF affiliate and that patient may qualify for  Chronic Care Management in Embedded when returning home from SNF rehab, if appropriate.    Please contact for further questions,  Natividad Brood, RN BSN [facility] Liaison  [phone] business mobile phone Toll free office [phone]  Fax number: [phone] [email or web address]

## 2021-05-14 NOTE — Progress Notes (Signed)
Initial Nutrition Assessment  DOCUMENTATION CODES:   Non-severe (moderate) malnutrition in context of chronic illness  INTERVENTION:   - Agree with Palliative Care consult regarding GOC and recommend discussing [place] around artificial nutrition; pt with chronic malnutrition, severe wound, and ongoing poor PO intake  - Add Ensure Enlive po TID, each supplement provides 350 kcal and 20 grams of protein  - Ice cream TID with meals  - Continue dysphagia 3 diet for ease of intake given poor dentition  - Feeding assistance TID with meals  - Continue MVI with minerals daily  NUTRITION DIAGNOSIS:   Moderate Malnutrition related to chronic illness (dementia, CHF, CKD) as evidenced by mild fat depletion, moderate muscle depletion.  GOAL:   Patient will meet greater than or equal to 90% of their needs  MONITOR:   PO intake, Supplement acceptance, Labs, Weight trends, Skin  REASON FOR ASSESSMENT:   Consult Assessment of nutrition requirement/status  ASSESSMENT:   88 year old female who presented to the ED on 1/06 with BLE edema and bradycardia. PMH of CAD s/p CABG, HTN, HLD, T2DM, CKD, hypothyroidism, recent COVID-19 infection, CHF, atrial fibrillation, RVR, dementia. Pt admitted with acute on chronic CHF.  01/08- pt became bradycardic and hypotensive with worsening mental status, started on dobutamine 01/09 - pt with melena, NPO, s/p EGD showing mild gastritis but no active bleed, full liquids 01/10 - off pressors 01/11 - transferred out of ICU, diet advanced to dysphagia 3  Spoke with pt at bedside. Pt sleeping soundly at time of RD visit but awoke briefly to RD touch and voice. Pt unable to provide much history as she continued to fall asleep during RD visit. Pt did state that she has not been eating well because she doesn't want to eat. RD asked pt if she had much of an appetite and pt shook her head "no." RD asked pt if she liked to eat ice cream and/or milkshakes. Pt stated that  she would be willing to try milkshakes. RD to order Ensure Enlive TID as well as ice cream. Recommend feeding assistance at meal times to maximize PO intake.  Pt does not know if she has been losing weight and is unsure of her UBW. Pt unable to provide any other nutrition-related information at this time due to falling back to sleep.  Reviewed weight history in chart. Current weight of 78.1 kg is up compared to weights from the last year which have been between 69-72 kg. Suspect currently weight is falsely elevated secondary to volume overload. Pt with mild pitting edema to BUE and deep pitting edema to BLE. Unsure of pt's dry weight at this time.  Admit weight: 74 kg Current weight: 78.1 kg  Meal Completion: 5-25%  Medications reviewed and include: Oscal with D, colace, fergon, SSI, MVI with minerals, protonix  Labs reviewed: sodium 134, BUN 25, creatinine 2.21, magnesium 2.8 CBG's: 99-120 x 24 hours  UOP: 129 ml x 24 hours I/O's: +2.5 L since admit  NUTRITION - FOCUSED PHYSICAL EXAM:  Flowsheet Row Most Recent Value  Orbital Region Mild depletion  Upper Arm Region Mild depletion  Thoracic and Lumbar Region No depletion  Buccal Region Mild depletion  Temple Region Mild depletion  Clavicle Bone Region Mild depletion  Clavicle and Acromion Bone Region Moderate depletion  Scapular Bone Region Mild depletion  Dorsal Hand Mild depletion  Patellar Region Mild depletion  Anterior Thigh Region Moderate depletion  Posterior Calf Region Moderate depletion  Edema (RD Assessment) Moderate  [BUE, BLE]  Hair  Reviewed  Eyes Reviewed  Mouth Reviewed  Skin Reviewed  Nails Reviewed       Diet Order:   Diet Order             DIET DYS 3 Room service appropriate? Yes; Fluid consistency: Thin  Diet effective now                   EDUCATION NEEDS:   Not appropriate for education at this time  Skin:  Skin Assessment: Skin Integrity Issues: Unstageable: sacrum  Last BM:   05/12/21  Height:   Ht Readings from Last 1 Encounters:  05/12/21 5\' 6"  (1.676 m)    Weight:   Wt Readings from Last 1 Encounters:  05/14/21 78.1 kg    BMI:  Body mass index is 27.79 kg/m.  Estimated Nutritional Needs:   Kcal:  1550-1750  Protein:  80-90 grams  Fluid:  1.5-1.7 L    Gustavus Bryant, MS, RD, LDN Inpatient Clinical Dietitian Please see AM[facility] for contact information.

## 2021-05-14 NOTE — Progress Notes (Signed)
Palliative:  Chart reviewed. Called patient's daughter [name] to setup goals of care discussion - she tells me she will be available tomorrow and asks that I call back tomorrow morning to setup a meeting.  Juel Burrow, DNP, AGNP-C Palliative Medicine Team Team Phone # [phone]  Pager # [phone]  NO CHARGE

## 2021-05-14 NOTE — Progress Notes (Addendum)
PROGRESS NOTE    Sheila Morgan  FXT:[id number] DOB: 28-Sep-1932 DOA: 05/09/2021 PCP: Binnie Rail, MD    Brief Narrative:  Mrs. Tusing was admitted to the hospital with the working diagnosis of cardiogenic shock due to bradycardia.   88 yo female with the past medical history of diastolic heart failure, HTN, T2DM, CAD sp CABG and dementia who presented with lethargy. Recent hospitalization for COVID on 04/05/21 and heart failure 04/18/21. She was transferred from SNF due to lower extremity edema, bradycardia and lethargy. On her initial physical examination her blood pressure was 125/59, HR 53 to 116, RR 26 and oxygen saturation 99%, her lungs were clear to auscultation, heart with S1 and S2 present and irregular, abdomen soft and positive lower extremity edema.  Patient was somnolent but easy to arouse.   Na 135, K 4,1, cl 106, bicarb 19, glucose 106, bun 12 and cr 1,28 Ca 8,3 Wbc 5,8, hgb 8,9, hct 30,4 and plt clumped.  SARS COVID 19 negative  Urine analysis with sg 1,015 negative for leukocytes.   Head CT with no acute changes, positive atrophy   Chest radiograph with bilateral atelectasis at bases.   EKG 49 bpm, with normal axis and normal qtc, atrial fibrillation rhythm, with no significant ST segment or T wave changes.   Patient was placed on furosemide for diuresis plus albumin infusions.   01/08 patient developed severe bradycardia and hypotension, along with worsening mentation.  Patient was placed on IV dobutamine and norepinephrine with good toleration.   01/09 positive melena and acute blood loss anemia, received 1 unit PRBC and GI was consulted.  EGD with mild gastritis with no active bleeding   01/10 wean off vasopressors and 01/11 transfer back to [facility].   Assessment & Plan:   Principal Problem:   Acute on chronic diastolic CHF (congestive heart failure) (HCC) Active Problems:   Essential hypertension   CORONARY ARTERY BYPASS GRAFT, HX OF   Coronary artery  disease   DM (diabetes mellitus) type II, controlled, with peripheral vascular disorder (HCC)   A-fib (HCC)   Hypoalbuminemia   Bradycardia   Hypotension   Melena   Malnutrition of moderate degree   Acute on chronic diastolic heart failure.  Patient continue to have lower extremity, edema, she is somnolent, no signs of dyspnea.   Close follow up on blood pressure, hold on furosemide for now.  Patient is off vasopressors.   2. Chronic atrial fibrillation with slow ventricular rate. Continue close telemetry monitoring Patient off vasopressors at this time. Continue anticoagulation with apixaban  3. AKI on CKD. hypomagnesemia Renal function stable, patient has received IV fluids for hypotension Continue close follow up of renal function and electrolytes, avoid hypotension and nephrotoxic medications.  Patient with very poor oral intake and poor urine output Foley catheter is in place.  Plan to start patient on NS at 50 ml per hr  4. Metabolic encephalopathy. Patient is somnolent today, continue close neuro checks for now. Pt and Ot.   5. Acute blood loss anemia, acute gastritis. Continue pantoprazole, hgb now is stable, after one unit pRBC. Continue close follow up on hgb and hct  6. Moderate calorie protein malnutrition. Continue with nutritional supplements.   Patient continue to be at high risk for worsening bradycardia.   Status is: Inpatient  Remains inpatient appropriate because: telemetry monitoring        DVT prophylaxis: Apixaban   Code Status:    full  Family Communication:   No family  at the bedside      Nutrition Status: Nutrition Problem: Moderate Malnutrition Etiology: chronic illness (dementia, CHF, CKD) Signs/Symptoms: mild fat depletion, moderate muscle depletion Interventions: Ensure Enlive (each supplement provides 350kcal and 20 grams of protein), MVI, Other (Comment), Refer to RD note for recommendations (feeding assistance)       Consultants:  Cardiology    Subjective: Patient is somnolent, limited history, not apparent pain or dyspnea.   Objective: Vitals:   05/14/21 0700 05/14/21 0741 05/14/21 0800 05/14/21 1046  BP: (!) 108/55  (!) 118/57 112/64  Pulse: 85  78 73  Resp: 19  (!) 22 19  Temp:  97.8 F (36.6 C)  (!) 97.4 F (36.3 C)  TempSrc:  Oral  Oral  SpO2: 96%  95% 96%  Weight:      Height:        Intake/Output Summary (Last 24 hours) at 05/14/2021 1549 Last data filed at 05/14/2021 1500 Gross per 24 hour  Intake 780 ml  Output 137 ml  Net 643 ml   Filed Weights   05/12/21 1356 05/13/21 0338 05/14/21 0443  Weight: 74.3 kg 76.2 kg 78.1 kg    Examination:   General: Not in pain or dyspnea  Neurology: Awake and alert, non focal  E ENT: positive pallor, no icterus, oral mucosa moist Cardiovascular: No JVD. S1-S2 present, rhythmic, no gallops, rubs, or murmurs. No lower extremity edema. Pulmonary: positive breath sounds bilaterally,  Gastrointestinal. Abdomen soft and not tender Skin. No rashes Musculoskeletal: no joint deformities     Data Reviewed: I have personally reviewed following labs and imaging studies  CBC: Recent Labs  Lab 05/09/21 1347 05/09/21 1439 05/11/21 0321 05/11/21 1820 05/11/21 1839 05/12/21 0116 05/12/21 1631 05/13/21 0406 05/14/21 0058  WBC 5.8  --  7.2 10.8*  --  8.6 9.4 7.3 5.8  NEUTROABS 3.9  --  5.6  --   --   --   --   --   --   HGB 8.9*   < > 7.8* 8.1* 7.8* 7.8* 10.8* 11.7* 10.3*  HCT 30.4*   < > 25.6* 26.4* 23.0* 25.5* 32.9* 36.3 31.9*  MCV 101.3*  --  96.6 96.7  --  96.2 94.3 95.5 93.3  PLT PLATELET CLUMPS NOTED ON SMEAR, UNABLE TO ESTIMATE  --  232 210  --  179 190 181 184   < > = values in this interval not displayed.   Basic Metabolic Panel: Recent Labs  Lab 05/09/21 1347 05/09/21 1439 05/11/21 0321 05/11/21 1839 05/12/21 0116 05/12/21 1631 05/13/21 0115 05/14/21 0058  NA 135   < > 137 136 138 125* 135 134*  K 4.1   < >  3.9 3.7 4.0 3.6 4.0 4.1  CL 106   < > 105  --  108 94* 106 105  CO2 19*   < > 21*  --  22 21* 19* 21*  GLUCOSE 106*   < > 96  --  104* 489* 94 111*  BUN 12   < > 12  --  15 16 20  25*  CREATININE 1.28*   < > 1.47*  --  1.74* 1.88* 2.03* 2.21*  CALCIUM 8.3*   < > 8.4*  --  8.3* 7.7* 8.6* 8.7*  MG 1.7  --   --   --   --  1.5* 1.5* 2.8*   < > = values in this interval not displayed.   GFR: Estimated Creatinine Clearance: 19.3 mL/min (A) (by C-G formula based  on SCr of 2.21 mg/dL (H)). Liver Function Tests: Recent Labs  Lab 05/09/21 1347  AST 26  ALT 21  ALKPHOS 127*  BILITOT 1.0  PROT 4.7*  ALBUMIN 1.8*   No results for input(s): LIPASE, AMYLASE in the last 168 hours. No results for input(s): AMMONIA in the last 168 hours. Coagulation Profile: Recent Labs  Lab 05/12/21 0956  INR 1.9*   Cardiac Enzymes: No results for input(s): CKTOTAL, CKMB, CKMBINDEX, TROPONINI in the last 168 hours. BNP (last 3 results) No results for input(s): PROBNP in the last 8760 hours. HbA1C: No results for input(s): HGBA1C in the last 72 hours. CBG: Recent Labs  Lab 05/13/21 1059 05/13/21 1613 05/13/21 2200 05/14/21 0630 05/14/21 1055  GLUCAP 96 102* 111* 99 120*   Lipid Profile: No results for input(s): CHOL, HDL, LDLCALC, TRIG, CHOLHDL, LDLDIRECT in the last 72 hours. Thyroid Function Tests: No results for input(s): TSH, T4TOTAL, FREET4, T3FREE, THYROIDAB in the last 72 hours. Anemia Panel: Recent Labs    05/12/21 1631  VITAMINB12 3,074*  FOLATE 18.9  FERRITIN 123  TIBC 136*  IRON 46  RETICCTPCT 4.1*      Radiology Studies: I have reviewed all of the imaging during this hospital visit personally     Scheduled Meds:  allopurinol  100 mg Oral Q M,W,F   apixaban  2.5 mg Oral BID   calcium-vitamin D  1 tablet Oral BID   Chlorhexidine Gluconate Cloth  6 each Topical Daily   docusate sodium  200 mg Oral Daily   ferrous gluconate  324 mg Oral Q breakfast   Gerhardt's butt  cream   Topical BID   insulin aspart  0-5 Units Subcutaneous QHS   insulin aspart  0-9 Units Subcutaneous TID WC   levothyroxine  50 mcg Oral Q0600   multivitamin with minerals  1 tablet Oral Daily   nystatin  1 application Topical Daily   pantoprazole  40 mg Oral Daily   simvastatin  40 mg Oral QHS   sodium chloride flush  3 mL Intravenous Q12H   Continuous Infusions:  sodium chloride     sodium chloride Stopped (05/12/21 1959)     LOS: 4 days        Daymon Hora Gerome Apley, MD

## 2021-05-14 NOTE — Progress Notes (Signed)
Progress Note  Patient Name: Sheila Morgan Date of Encounter: 05/14/2021  [facility] Cardiologist: Quay Burow, MD   Subjective   No acute events overnight.  No complaints.  Inpatient Medications    Scheduled Meds:  allopurinol  100 mg Oral Q M,W,F   apixaban  2.5 mg Oral BID   calcium-vitamin D  1 tablet Oral BID   Chlorhexidine Gluconate Cloth  6 each Topical Daily   docusate sodium  200 mg Oral Daily   ferrous gluconate  324 mg Oral Q breakfast   Gerhardt's butt cream   Topical BID   insulin aspart  0-5 Units Subcutaneous QHS   insulin aspart  0-9 Units Subcutaneous TID WC   levothyroxine  50 mcg Oral Q0600   multivitamin with minerals  1 tablet Oral Daily   nystatin  1 application Topical Daily   pantoprazole (PROTONIX) IV  40 mg Intravenous QHS   simvastatin  40 mg Oral QHS   sodium chloride flush  3 mL Intravenous Q12H   Continuous Infusions:  sodium chloride     sodium chloride Stopped (05/12/21 1959)   PRN Meds: sodium chloride, acetaminophen, ondansetron (ZOFRAN) IV, sodium chloride flush   Vital Signs    Vitals:   05/14/21 0630 05/14/21 0700 05/14/21 0741 05/14/21 0800  BP:  (!) 108/55  (!) 118/57  Pulse:  85  78  Resp:  19  (!) 22  Temp: 98.4 F (36.9 C)  97.8 F (36.6 C)   TempSrc: Axillary  Oral   SpO2:  96%  95%  Weight:      Height:        Intake/Output Summary (Last 24 hours) at 05/14/2021 0835 Last data filed at 05/14/2021 0600 Gross per 24 hour  Intake 878.46 ml  Output 129 ml  Net 749.46 ml   Last 3 Weights 05/14/2021 05/13/2021 05/12/2021  Weight (lbs) 172 lb 2.9 oz 168 lb 163 lb 12.8 oz  Weight (kg) 78.1 kg 76.204 kg 74.3 kg      Telemetry    AF rate controlled- Personally Reviewed  ECG    AF with slow VR without BBB 05/09/21- Personally Reviewed  Physical Exam   GEN: No acute distress, AO x 2, dry MM Neck: No JVD Cardiac: Irregular, no murmurs, rubs, or gallops.  Respiratory: Clear to auscultation  bilaterally. GI: Soft, nontender, non-distended  MS: No edema; No deformity. Neuro:  Nonfocal  Psych: Normal affect   Labs    High Sensitivity Troponin:   Recent Labs  Lab 05/09/21 1347 05/09/21 1610 05/11/21 1622 05/11/21 1820  TROPONINIHS 23* 25* 17 17     Chemistry Recent Labs  Lab 05/09/21 1347 05/09/21 1439 05/12/21 1631 05/13/21 0115 05/14/21 0058  NA 135   < > 125* 135 134*  K 4.1   < > 3.6 4.0 4.1  CL 106   < > 94* 106 105  CO2 19*   < > 21* 19* 21*  GLUCOSE 106*   < > 489* 94 111*  BUN 12   < > 16 20 25*  CREATININE 1.28*   < > 1.88* 2.03* 2.21*  CALCIUM 8.3*   < > 7.7* 8.6* 8.7*  MG 1.7  --  1.5* 1.5* 2.8*  PROT 4.7*  --   --   --   --   ALBUMIN 1.8*  --   --   --   --   AST 26  --   --   --   --  ALT 21  --   --   --   --   ALKPHOS 127*  --   --   --   --   BILITOT 1.0  --   --   --   --   GFRNONAA 41*   < > 26* 23* 21*  ANIONGAP 10   < > 10 10 8    < > = values in this interval not displayed.    Lipids No results for input(s): CHOL, TRIG, HDL, LABVLDL, LDLCALC, CHOLHDL in the last 168 hours.  Hematology Recent Labs  Lab 05/12/21 1631 05/13/21 0406 05/14/21 0058  WBC 9.4 7.3 5.8  RBC 3.49*   3.42* 3.80* 3.42*  HGB 10.8* 11.7* 10.3*  HCT 32.9* 36.3 31.9*  MCV 94.3 95.5 93.3  MCH 30.9 30.8 30.1  MCHC 32.8 32.2 32.3  RDW 15.9* 16.6* 16.3*  PLT 190 181 184   Thyroid  Recent Labs  Lab 05/09/21 1446 05/10/21 1055  TSH 4.846*  --   FREET4  --  1.28*    BNP Recent Labs  Lab 05/09/21 1446 05/13/21 1136  BNP 722.2* 444.5*    DDimer No results for input(s): DDIMER in the last 168 hours.   Radiology    ECHOCARDIOGRAM COMPLETE  Result Date: 05/12/2021    ECHOCARDIOGRAM REPORT   Patient Name:   LYNASIA MELOCHE Date of Exam: 05/12/2021 Medical Rec #:  [id number]       Height:       66.0 in Accession #:    [id number]      Weight:       163.8 lb Date of Birth:  Jul 22, 1932       BSA:          1.837 m Patient Age:    88 years        BP:            110/66 mmHg Patient Gender: F               HR:           108 bpm. Exam Location:  Inpatient Procedure: 2D Echo, Color Doppler and Cardiac Doppler Indications:    P37.90 Acute systolic (congestive) heart failure  History:        Patient has prior history of Echocardiogram examinations, most                 recent 04/19/2021. CHF, Prior CABG; Risk Factors:Hypertension,                 Diabetes and Dyslipidemia.  Sonographer:    Raquel Sarna Senior RDCS Referring Phys: [id number] AVA SWAYZE IMPRESSIONS  1. Left ventricular ejection fraction, by estimation, is 55 to 60%. The left ventricle has normal function. The left ventricle has no regional wall motion abnormalities. There is moderate left ventricular hypertrophy. Left ventricular diastolic parameters are indeterminate.  2. Right ventricular systolic function is moderately reduced. The right ventricular size is mildly enlarged. There is mildly elevated pulmonary artery systolic pressure. The estimated right ventricular systolic pressure is 73.5 mmHg.  3. Left atrial size was mildly dilated.  4. Right atrial size was mildly dilated.  5. The mitral valve is degenerative. Mild mitral valve regurgitation. No evidence of mitral stenosis. Moderate mitral annular calcification.  6. Tricuspid valve regurgitation is severe.  7. The aortic valve is tricuspid. Aortic valve regurgitation is not visualized. Aortic valve sclerosis/calcification is present, without any evidence of aortic stenosis.  8. The inferior vena cava is  dilated in size with >50% respiratory variability, suggesting right atrial pressure of 8 mmHg. FINDINGS  Left Ventricle: Left ventricular ejection fraction, by estimation, is 55 to 60%. The left ventricle has normal function. The left ventricle has no regional wall motion abnormalities. The left ventricular internal cavity size was normal in size. There is  moderate left ventricular hypertrophy. Left ventricular diastolic parameters are indeterminate. Right Ventricle:  The right ventricular size is mildly enlarged. Right vetricular wall thickness was not well visualized. Right ventricular systolic function is moderately reduced. There is mildly elevated pulmonary artery systolic pressure. The tricuspid  regurgitant velocity is 2.66 m/s, and with an assumed right atrial pressure of 8 mmHg, the estimated right ventricular systolic pressure is 14.4 mmHg. Left Atrium: Left atrial size was mildly dilated. Right Atrium: Right atrial size was mildly dilated. Pericardium: There is no evidence of pericardial effusion. Mitral Valve: The mitral valve is degenerative in appearance. Moderate mitral annular calcification. Mild mitral valve regurgitation. No evidence of mitral valve stenosis. Tricuspid Valve: The tricuspid valve is normal in structure. Tricuspid valve regurgitation is severe. Aortic Valve: The aortic valve is tricuspid. Aortic valve regurgitation is not visualized. Aortic valve sclerosis/calcification is present, without any evidence of aortic stenosis. Pulmonic Valve: The pulmonic valve was not well visualized. Pulmonic valve regurgitation is trivial. Aorta: The aortic root and ascending aorta are structurally normal, with no evidence of dilitation. Venous: The inferior vena cava is dilated in size with greater than 50% respiratory variability, suggesting right atrial pressure of 8 mmHg. IAS/Shunts: The interatrial septum was not well visualized.  LEFT VENTRICLE PLAX 2D LVIDd:         3.50 cm LVIDs:         2.80 cm LV PW:         1.30 cm LV IVS:        1.30 cm LVOT diam:     1.80 cm LV SV:         30 LV SV Index:   16 LVOT Area:     2.54 cm  RIGHT VENTRICLE RV S prime:     5.33 cm/s TAPSE (M-mode): 1.2 cm LEFT ATRIUM             Index        RIGHT ATRIUM           Index LA diam:        3.40 cm 1.85 cm/m   RA Area:     22.70 cm LA Vol (A2C):   63.4 ml 34.51 ml/m  RA Volume:   69.40 ml  37.78 ml/m LA Vol (A4C):   59.5 ml 32.39 ml/m LA Biplane Vol: 65.9 ml 35.87 ml/m   AORTIC VALVE LVOT Vmax:   59.10 cm/s LVOT Vmean:  43.300 cm/s LVOT VTI:    0.117 m  AORTA Ao Root diam: 2.70 cm Ao Asc diam:  3.30 cm TRICUSPID VALVE TR Peak grad:   28.3 mmHg TR Vmax:        266.00 cm/s  SHUNTS Systemic VTI:  0.12 m Systemic Diam: 1.80 cm Oswaldo Milian MD Electronically signed by Oswaldo Milian MD Signature Date/Time: 05/12/2021/5:23:25 PM    Final     Cardiac Studies   TTE 05/12/21  1. Left ventricular ejection fraction, by estimation, is 55 to 60%. The  left ventricle has normal function. The left ventricle has no regional  wall motion abnormalities. There is moderate left ventricular hypertrophy.  Left ventricular diastolic  parameters are indeterminate.  2. Right ventricular systolic function is moderately reduced. The right  ventricular size is mildly enlarged. There is mildly elevated pulmonary  artery systolic pressure. The estimated right ventricular systolic  pressure is 45.9 mmHg.   3. Left atrial size was mildly dilated.   4. Right atrial size was mildly dilated.   5. The mitral valve is degenerative. Mild mitral valve regurgitation. No  evidence of mitral stenosis. Moderate mitral annular calcification.   6. Tricuspid valve regurgitation is severe.   7. The aortic valve is tricuspid. Aortic valve regurgitation is not  visualized. Aortic valve sclerosis/calcification is present, without any  evidence of aortic stenosis.   8. The inferior vena cava is dilated in size with >50% respiratory  variability, suggesting right atrial pressure of 8 mmHg.     Patient Profile     88 y.o. female AF on Xarelto, CAD s/p CABG with LIMA to LAD and BMS RCA 2002, HTN, CKD Cr ~1.3-1.4 here with failure to thrive/lethargy/bilateral edema treated with diuretics, developed AKI, and bradycardia.  Assessment & Plan    Atrial fibrillation with slow ventricular rate/bradycardia:  Rates improved.  Would hold off on rate control agents for now, can restart as outpatient.    AKI:  Cr up to 2.2, looks dry, consider IVF.   Acute on chronic diastolic dysfunction:   Hold lasix for now.  Looks euvolemic if not dry today.  Will consider Jardiance as outpatient.   CAD:  s/p LIMA to LAD and BMS RCA 2002:  No ASA in lieu of Xarelto.  Holding BB for now.  Cont statin.   HL:  Cont statin.   Hypotension:  Resolved.   GIB:  Erosive gastritis, ok for A/C. [facility] will sign off.   Medication Recommendations:  Cont apixiban 2.5 bid, hold rate control agents for now  Other recommendations (labs, testing, etc):   Follow up as an outpatient:  06/04/21 at 10:00AM at [facility]  For questions or updates, please contact [place] Please consult [email or web address] for contact info under        Signed, Early Osmond, MD  05/14/2021, 8:35 AM

## 2021-05-15 ENCOUNTER — Telehealth: Payer: Self-pay | Admitting: Internal Medicine

## 2021-05-15 DIAGNOSIS — Z515 Encounter for palliative care: Secondary | ICD-10-CM

## 2021-05-15 DIAGNOSIS — R627 Adult failure to thrive: Secondary | ICD-10-CM

## 2021-05-15 DIAGNOSIS — Z7189 Other specified counseling: Secondary | ICD-10-CM

## 2021-05-15 DIAGNOSIS — E440 Moderate protein-calorie malnutrition: Secondary | ICD-10-CM

## 2021-05-15 LAB — BASIC METABOLIC PANEL
Anion gap: 8 (ref 5–15)
BUN: 26 mg/dL — ABNORMAL HIGH (ref 8–23)
CO2: 21 mmol/L — ABNORMAL LOW (ref 22–32)
Calcium: 8.4 mg/dL — ABNORMAL LOW (ref 8.9–10.3)
Chloride: 105 mmol/L (ref 98–111)
Creatinine, Ser: 2.18 mg/dL — ABNORMAL HIGH (ref 0.44–1.00)
GFR, Estimated: 22 mL/min — ABNORMAL LOW (ref >60)
Glucose, Bld: 87 mg/dL (ref 70–99)
Potassium: 4.2 mmol/L (ref 3.5–5.1)
Sodium: 134 mmol/L — ABNORMAL LOW (ref 135–145)

## 2021-05-15 LAB — CORTISOL: Cortisol, Plasma: 7.2 ug/dL

## 2021-05-15 LAB — GLUCOSE, CAPILLARY
Glucose-Capillary: 89 mg/dL (ref 70–99)
Glucose-Capillary: 86 mg/dL (ref 70–99)
Glucose-Capillary: 99 mg/dL (ref 70–99)
Glucose-Capillary: 117 mg/dL — ABNORMAL HIGH (ref 70–99)

## 2021-05-15 MED ORDER — HYDROCORTISONE SOD SUC (PF) 100 MG IJ SOLR: 50.0000 mg | Freq: Three times a day (TID) | INTRAMUSCULAR | Status: DC

## 2021-05-15 MED ORDER — LEVOTHYROXINE SODIUM 100 MCG/5ML IV SOLN
25.0000 ug | Freq: Every day | INTRAVENOUS | Status: DC
  Administered 2021-05-15 – 2021-05-20 (×6): 25 ug via INTRAVENOUS
  Filled 2021-05-15 (×7): qty 5

## 2021-05-15 MED ORDER — HYDROCORTISONE SOD SUC (PF) 100 MG IJ SOLR
75.0000 mg | Freq: Two times a day (BID) | INTRAMUSCULAR | Status: DC
  Administered 2021-05-15 – 2021-05-20 (×10): 75 mg via INTRAVENOUS
  Filled 2021-05-15 (×10): qty 2

## 2021-05-15 MED ORDER — THIAMINE HCL 100 MG/ML IJ SOLN
250.0000 mg | Freq: Two times a day (BID) | INTRAVENOUS | Status: AC
  Administered 2021-05-15 – 2021-05-19 (×10): 250 mg via INTRAVENOUS
  Filled 2021-05-15 (×11): qty 2.5

## 2021-05-15 NOTE — Care Management Important Message (Signed)
Important Message  Patient Details  Name: Sheila Morgan MRN: [id number] Date of Birth: 1933/03/14   Medicare Important Message Given:  Yes     Manette Doto Montine Circle 05/15/2021, 2:33 PM

## 2021-05-15 NOTE — Consult Note (Signed)
Consultation Note Date: 05/15/2021   Patient Name: Sheila Morgan  DOB: [birth year]  MRN: [id number]  Age / Sex: 88 y.o., female  PCP: Binnie Rail, MD Referring Physician: Tawni Millers  Reason for Consultation: Establishing goals of care  HPI/Patient Profile: 88 y.o. female  with past medical history of  diastolic heart failure, HTN, T2DM, CAD sp CABG and dementia  admitted on 05/09/2021 with lethargy. Recent hospitalizations for COVID on 04/05/21 and heart failure 04/18/21. This admission, found to have lower extremity edema and bradycardia. Patient was placed on furosemide for diuresis plus albumin infusions. On 1/8, patient developed severe bradycardia and hypotension, along with worsening mentation. Transferred to ICU and placed on IV dobutamine and norepinephrine. 1/9 had melena and anemia, EGD revealed mild gastritis with no active bleeding. She has since been weaned off pressors and transferred back to ICU. Patient also with AKI on CKD. Patient with ongoing lethargy and poor PO intake. PMT consulted to discuss [place].  Of note, in [place] patient had MOST form signed by daughter that was completed recently - 04/24/21. MOST indicated wishes for full code, full scope treatment, and feeding tube for trial.   Clinical Assessment and Goals of Care: I have reviewed medical records including EPIC notes, labs and imaging, received report from RN, assessed the patient and then met with patient's daughter, Sheila Morgan,  to discuss diagnosis prognosis, [place], EOL wishes, disposition and options.  I introduced Palliative Medicine as specialized medical care for people living with serious illness. It focuses on providing relief from the symptoms and stress of a serious illness. The goal is to improve quality of life for both the patient and the family.  Daughter tells me prior to December 2022, patient was living independently. He children check on her often  and she did not spend much time alone. Patient used walker for ambulation and even drove short distances. Daughter does share of some forgetfulness and confusion. Daughter tells me of poor appetite at baseline.    We discussed patient's current illness and what it means in the larger context of patient's on-going co-morbidities.  Natural disease trajectory and expectations at EOL were discussed. We discuss concern about patient's ongoing lethargy and poor PO intake. We discuss her heart disease and AKI.   I attempted to elicit values and goals of care important to the patient.    The difference between aggressive medical intervention and comfort care was considered in light of the patient's goals of care.   Advance directives, concepts specific to code status, artificial feeding and hydration, and rehospitalization were considered and discussed.  At this time, daughter remains interested in all medical interventions offered including full code status and would be open to trial of feeding tube. This is in line with recently completed MOST form.   Daughter does share that she understands patient may be nearing end of life but she would like to continue current interventions to allow time for outcomes. She does tell me that she does not want patient to suffer and she would not want to continue aggressive care interventions long-term - but she would want them short term to allow family time to say goodbye.   Discussed with family the importance of continued conversation with family and the medical providers regarding overall plan of care and treatment options, ensuring decisions are within the context of the patients values and GOCs.    Daughter does share she is hopeful patient will be able to return to [facility] but  she acknowledges this may not be realistic, will depend on condition in coming days.   Daughter asks about prednisone being restarted - tells me patient took daily for years d/t RA  and she is wondering if may help with mental status and appetite.   Questions and concerns were addressed. The family was encouraged to call with questions or concerns.    Primary Decision Maker NEXT OF KIN - daughter and son   SUMMARY OF RECOMMENDATIONS   - full code/full scope - would be open to trial of feeding tube - daughter asking if prednisone could be restarted - discussed with Dr. Cathlean Sauer - PMT will follow along  Code Status/Advance Care Planning: Full code  Additional Recommendations (Limitations, Scope, Preferences): Full Scope Treatment     Primary Diagnoses: Present on Admission:  DM (diabetes mellitus) type II, controlled, with peripheral vascular disorder ([place])  Essential hypertension  Coronary artery disease  A-fib (HCC)  Hypoalbuminemia  Acute on chronic diastolic CHF (congestive heart failure) ([place])   I have reviewed the medical record, interviewed the patient and family, and examined the patient. The following aspects are pertinent.  Past Medical History:  Diagnosis Date   Arthritis    Cancer ([place])    skin on nose .  mylenoma   Coronary artery disease    status post post LAD angioplasty in 1993  and subsequent coronary artery bypass grafting x1 with a LIMA to the LAD July of 1994. She had stenting of her RCA with a bare-metal stent November 2002.   Diabetes mellitus    2   Gout    Heart murmur    Hematoma    Right groin   Hemorrhoids    Hiatal hernia    History of blood transfusion    HOH (hard of hearing)    Hyperlipidemia    Hypertension    Left knee DJD    Lightheadedness    has seen Dr Gwenlyn Found and ENT.. cant find out why   Myocardial infarction [facility])    Osteoporosis    PONV (postoperative nausea and vomiting)    Shingles 10/27/2013   Social History   Socioeconomic History   Marital status: Widowed    Spouse name: Not on file   Number of children: 2   Years of education: Not on file   Highest education level: 12th grade   Occupational History   Occupation: Retired   Tobacco Use   Smoking status: Never   Smokeless tobacco: Never  Vaping Use   Vaping Use: Never used  Substance and Sexual Activity   Alcohol use: No   Drug use: No   Sexual activity: Never  Other Topics Concern   Not on file  Social History Narrative   Left handed    Caffeine 4 cups daily    Lives at home alone    Social Determinants of Health   Financial Resource Strain: Not on file  Food Insecurity: Not on file  Transportation Needs: Not on file  Physical Activity: Not on file  Stress: Not on file  Social Connections: Not on file   Family History  Problem Relation Age of Onset   Heart disease Father    Cancer Sister        breast   Diabetes Sister    Heart disease Brother    Heart disease Brother    Heart disease Brother    Diabetes Sister    Stroke Mother    Scheduled Meds:  allopurinol  100 mg  Oral Q M,W,F   apixaban  2.5 mg Oral BID   calcium-vitamin D  1 tablet Oral BID   Chlorhexidine Gluconate Cloth  6 each Topical Daily   docusate sodium  200 mg Oral Daily   ferrous gluconate  324 mg Oral Q breakfast   Gerhardt's butt cream   Topical BID   hydrocortisone sod succinate (SOLU-CORTEF) inj  75 mg Intravenous Q12H   insulin aspart  0-5 Units Subcutaneous QHS   insulin aspart  0-9 Units Subcutaneous TID WC   levothyroxine  25 mcg Intravenous Daily   multivitamin with minerals  1 tablet Oral Daily   nystatin  1 application Topical Daily   pantoprazole  40 mg Oral Daily   simvastatin  40 mg Oral QHS   sodium chloride flush  3 mL Intravenous Q12H   Continuous Infusions:  sodium chloride     sodium chloride Stopped (05/12/21 1959)   sodium chloride 50 mL/hr at 05/15/21 1400   thiamine injection 250 mg (05/15/21 1301)   PRN Meds:.sodium chloride, acetaminophen, ondansetron (ZOFRAN) IV, sodium chloride flush No Known Allergies Review of Systems  Unable to perform ROS: Mental status change   Physical  Exam Constitutional:      Comments: Lethargic, wakes to voice, back to sleep quickly  Pulmonary:     Effort: Pulmonary effort is normal.  Skin:    General: Skin is warm and dry.    Vital Signs: BP (!) 156/75 (BP Location: Right Arm)    Pulse 91    Temp 99.1 F (37.3 C) (Axillary)    Resp (!) 24    Ht 5' 6"  (1.676 m)    Wt 79.2 kg    SpO2 95%    BMI 28.18 kg/m  Pain Scale: PAINAD POSS *See Group Information*: S-Acceptable,Sleep, easy to arouse Pain Score: Asleep   SpO2: SpO2: 95 % O2 Device:SpO2: 95 % O2 Flow Rate: .O2 Flow Rate (L/min): 0 L/min  IO: Intake/output summary:  Intake/Output Summary (Last 24 hours) at 05/15/2021 1440 Last data filed at 05/15/2021 1400 Gross per 24 hour  Intake 1237.37 ml  Output 232 ml  Net 1005.37 ml    LBM: Last BM Date: 05/12/21 Baseline Weight: Weight: 74 kg Most recent weight: Weight: 79.2 kg     Palliative Assessment/Data: PPS 20%    Time Total: 90 minutes  Greater than 50%  of this time was spent counseling and coordinating care related to the above assessment and plan.  Juel Burrow, DNP, AGNP-C Palliative Medicine Team [phone] Pager: [phone]

## 2021-05-15 NOTE — Telephone Encounter (Signed)
N/A unable to leave a message for patient to call me back at [phone] to schedule Medicare Annual Wellness Visit   Last AWV  01/29/20  Please schedule at anytime with [facility] if patient calls the office back.    40 Minutes appointment   Any questions, please call me at [phone]

## 2021-05-15 NOTE — Progress Notes (Addendum)
PROGRESS NOTE    Sheila Morgan  VQQ:[id number] DOB: 06-Feb-1933 DOA: 05/09/2021 PCP: Binnie Rail, MD    Brief Narrative:  Sheila Morgan was admitted to the hospital with the working diagnosis of cardiogenic shock due to bradycardia.    88 yo female with the past medical history of diastolic heart failure, HTN, T2DM, CAD sp CABG and dementia who presented with lethargy. Recent hospitalization for COVID on 04/05/21 and heart failure 04/18/21. She was transferred from SNF due to lower extremity edema, bradycardia and lethargy. On her initial physical examination her blood pressure was 125/59, HR 53 to 116, RR 26 and oxygen saturation 99%, her lungs were clear to auscultation, heart with S1 and S2 present and irregular, abdomen soft and positive lower extremity edema.  Patient was somnolent but easy to arouse.    Na 135, K 4,1, cl 106, bicarb 19, glucose 106, bun 12 and cr 1,28 Ca 8,3 Wbc 5,8, hgb 8,9, hct 30,4 and plt clumped.  SARS COVID 19 negative   Urine analysis with sg 1,015 negative for leukocytes.    Head CT with no acute changes, positive atrophy    Chest radiograph with bilateral atelectasis at bases.    EKG 49 bpm, with normal axis and normal qtc, atrial fibrillation rhythm, with no significant ST segment or T wave changes.    Patient was placed on furosemide for diuresis plus albumin infusions.    01/08 patient developed severe bradycardia and hypotension, along with worsening mentation.  Patient was placed on IV dobutamine and norepinephrine with good toleration.    01/09 positive melena and acute blood loss anemia, received 1 unit PRBC and GI was consulted.  EGD with mild gastritis with no active bleeding    01/10 wean off vasopressors and 01/11 transfer back to [facility].    Assessment & Plan:   Principal Problem:   Acute on chronic diastolic CHF (congestive heart failure) (HCC) Active Problems:   Essential hypertension   CORONARY ARTERY BYPASS GRAFT, HX OF    Coronary artery disease   DM (diabetes mellitus) type II, controlled, with peripheral vascular disorder (HCC)   A-fib (HCC)   Hypoalbuminemia   Bradycardia   Hypotension   Melena   Malnutrition of moderate degree   Acute on chronic diastolic heart failure, pulmonary hypertension, chronic core pulmonale.  Patient has peripheral edema, but suspected intravascular volume depletion, no signs of pulmonary edema.     Continue volume repletion with isotonic saline, patient with very poor oral intake.  Echocardiogram with LV systolic function preserved with EF 55 to 60%, moderate LVH. RV mild reduction in systolic function. Mild elevated pulmonary systolic pressure. Severe tricuspid regurgitation.   Continue blood pressure monitoring.    2. Chronic atrial fibrillation with slow ventricular rate.  Telemetry personally reviewed, patient with atrial fibrillation, with rate 70 to 80.  Continue to hold on AV blockade, at home patient on diltiazem and carvedilol.  Anticoagulation with apixaban   3. AKI on CKD. Hypomagnesemia, hyponatremia. Renal function with serum Na 134, K is 4,2 and serum bicarbonate 21. Cr is 2,18 and BUN is 26.  Urine output is 162 ml in 24 hrs. Patient with very poor oral intake.,   Continue volume repletion with isotonic saline at 50 ml per hr.  Discontinue foley cathter today and continue close follow up of urine output.    4. Metabolic encephalopathy. Swallow dysfunction  Continue with somnolence, she is non focal and easy to arouse. Possible metabolic encephalopathy, recent COVID 19  infection and hospitalization for heart failure.  On dysphagia 3 diet.   TSH is 4,84 and free T 4 is 1.28 Clinical hypothyroid, will change levothyroxine to IV formulation 25 mcg dosing and follow up response.  Possible poor absorption.  Check random cortisol level today.    5. Acute blood loss anemia, acute gastritis. Iron deficiency  On pantoprazole, hgb now is stable, after   pRBC. Hgb today 10,3 and hct 31,9 Continue with oral iron supplementation.   6. Moderate calorie protein malnutrition/ pressure ulcer sacrum stage 2 present on admission. Continue with nutritional supplements.  Add thiamine high dose and multivitamins.   7. Gout. No acute flare, continue with allopurinol.   8. T2DM. Continue glucose cover and monitoring with insulin sliding scale, patient with very poor oral intake.   Patient continue to be at high risk for worsening encephalopathy and bradycardia   Status is: Inpatient  Remains inpatient appropriate because: encephalopathy    DVT prophylaxis: Apixaban   Code Status:    full  Family Communication:   No family at the bedside      Nutrition Status: Nutrition Problem: Moderate Malnutrition Etiology: chronic illness (dementia, CHF, CKD) Signs/Symptoms: mild fat depletion, moderate muscle depletion Interventions: Ensure Enlive (each supplement provides 350kcal and 20 grams of protein), MVI, Other (Comment), Refer to RD note for recommendations (feeding assistance)     Skin Documentation: Pressure Injury 04/19/21 Sacrum Lower;Posterior Unstageable - Full thickness tissue loss in which the base of the injury is covered by slough (yellow, tan, gray, green or brown) and/or eschar (tan, brown or black) in the wound bed. wound consult placed (Active)  04/19/21 1755  Location: Sacrum  Location Orientation: Lower;Posterior  Staging: Unstageable - Full thickness tissue loss in which the base of the injury is covered by slough (yellow, tan, gray, green or brown) and/or eschar (tan, brown or black) in the wound bed.  Wound Description (Comments): wound consult placed  Present on Admission: Yes     Consultants:  Cardiology    Subjective: Patient with continue to be very somnolent, no nausea or vomiting, she is easy to arouse, orientated times place and situation.   Objective: Vitals:   05/14/21 1942 05/14/21 2346 05/15/21 0323  05/15/21 0748  BP: 136/84 139/77 129/62 (!) 151/77  Pulse: 81 67 74 78  Resp: 19 20 (!) 25 16  Temp: 98.8 F (37.1 C) 98.6 F (37 C) 98.2 F (36.8 C) 98.2 F (36.8 C)  TempSrc: Oral Oral Oral   SpO2: 94% 98% 96% 99%  Weight:   79.2 kg   Height:        Intake/Output Summary (Last 24 hours) at 05/15/2021 0900 Last data filed at 05/15/2021 0323 Gross per 24 hour  Intake 683.3 ml  Output 162 ml  Net 521.3 ml   Filed Weights   05/13/21 0338 05/14/21 0443 05/15/21 0323  Weight: 76.2 kg 78.1 kg 79.2 kg    Examination:   General: deconditioned and ill looking appearing  Neurology: somnolent and but easy to arouse.  E ENT: no pallor, no icterus, oral mucosa moist Cardiovascular: No JVD. S1-S2 present, rhythmic, no gallops, rubs, or murmurs. Positive ++ pitting bilateral lower extremity edema. Pulmonary: positive breath sounds bilaterally, adequate air movement, no wheezing, rhonchi or rales. Gastrointestinal. Abdomen soft and non tender Skin. No rashes Musculoskeletal: no joint deformities     Data Reviewed: I have personally reviewed following labs and imaging studies  CBC: Recent Labs  Lab 05/09/21 1347 05/09/21 1439 05/11/21  0321 05/11/21 1820 05/11/21 1839 05/12/21 0116 05/12/21 1631 05/13/21 0406 05/14/21 0058  WBC 5.8  --  7.2 10.8*  --  8.6 9.4 7.3 5.8  NEUTROABS 3.9  --  5.6  --   --   --   --   --   --   HGB 8.9*   < > 7.8* 8.1* 7.8* 7.8* 10.8* 11.7* 10.3*  HCT 30.4*   < > 25.6* 26.4* 23.0* 25.5* 32.9* 36.3 31.9*  MCV 101.3*  --  96.6 96.7  --  96.2 94.3 95.5 93.3  PLT PLATELET CLUMPS NOTED ON SMEAR, UNABLE TO ESTIMATE  --  232 210  --  179 190 181 184   < > = values in this interval not displayed.   Basic Metabolic Panel: Recent Labs  Lab 05/09/21 1347 05/09/21 1439 05/12/21 0116 05/12/21 1631 05/13/21 0115 05/14/21 0058 05/15/21 0129  NA 135   < > 138 125* 135 134* 134*  K 4.1   < > 4.0 3.6 4.0 4.1 4.2  CL 106   < > 108 94* 106 105 105  CO2  19*   < > 22 21* 19* 21* 21*  GLUCOSE 106*   < > 104* 489* 94 111* 87  BUN 12   < > 15 16 20  25* 26*  CREATININE 1.28*   < > 1.74* 1.88* 2.03* 2.21* 2.18*  CALCIUM 8.3*   < > 8.3* 7.7* 8.6* 8.7* 8.4*  MG 1.7  --   --  1.5* 1.5* 2.8*  --    < > = values in this interval not displayed.   GFR: Estimated Creatinine Clearance: 19.7 mL/min (A) (by C-G formula based on SCr of 2.18 mg/dL (H)). Liver Function Tests: Recent Labs  Lab 05/09/21 1347  AST 26  ALT 21  ALKPHOS 127*  BILITOT 1.0  PROT 4.7*  ALBUMIN 1.8*   No results for input(s): LIPASE, AMYLASE in the last 168 hours. No results for input(s): AMMONIA in the last 168 hours. Coagulation Profile: Recent Labs  Lab 05/12/21 0956  INR 1.9*   Cardiac Enzymes: No results for input(s): CKTOTAL, CKMB, CKMBINDEX, TROPONINI in the last 168 hours. BNP (last 3 results) No results for input(s): PROBNP in the last 8760 hours. HbA1C: No results for input(s): HGBA1C in the last 72 hours. CBG: Recent Labs  Lab 05/14/21 0630 05/14/21 1055 05/14/21 1649 05/14/21 2108 05/15/21 0558  GLUCAP 99 120* 92 112* 89   Lipid Profile: No results for input(s): CHOL, HDL, LDLCALC, TRIG, CHOLHDL, LDLDIRECT in the last 72 hours. Thyroid Function Tests: No results for input(s): TSH, T4TOTAL, FREET4, T3FREE, THYROIDAB in the last 72 hours. Anemia Panel: Recent Labs    05/12/21 1631  VITAMINB12 3,074*  FOLATE 18.9  FERRITIN 123  TIBC 136*  IRON 46  RETICCTPCT 4.1*      Radiology Studies: I have reviewed all of the imaging during this hospital visit personally     Scheduled Meds:  allopurinol  100 mg Oral Q M,W,F   apixaban  2.5 mg Oral BID   calcium-vitamin D  1 tablet Oral BID   Chlorhexidine Gluconate Cloth  6 each Topical Daily   docusate sodium  200 mg Oral Daily   ferrous gluconate  324 mg Oral Q breakfast   Gerhardt's butt cream   Topical BID   insulin aspart  0-5 Units Subcutaneous QHS   insulin aspart  0-9 Units  Subcutaneous TID WC   levothyroxine  50 mcg Oral Q0600  multivitamin with minerals  1 tablet Oral Daily   nystatin  1 application Topical Daily   pantoprazole  40 mg Oral Daily   simvastatin  40 mg Oral QHS   sodium chloride flush  3 mL Intravenous Q12H   Continuous Infusions:  sodium chloride     sodium chloride Stopped (05/12/21 1959)   sodium chloride 50 mL/hr at 05/15/21 0323     LOS: 5 days        Yaacov Koziol Gerome Apley, MD

## 2021-05-16 LAB — BASIC METABOLIC PANEL
Anion gap: 8 (ref 5–15)
BUN: 26 mg/dL — ABNORMAL HIGH (ref 8–23)
CO2: 18 mmol/L — ABNORMAL LOW (ref 22–32)
Calcium: 8.8 mg/dL — ABNORMAL LOW (ref 8.9–10.3)
Chloride: 106 mmol/L (ref 98–111)
Creatinine, Ser: 2.08 mg/dL — ABNORMAL HIGH (ref 0.44–1.00)
GFR, Estimated: 23 mL/min — ABNORMAL LOW (ref >60)
Glucose, Bld: 152 mg/dL — ABNORMAL HIGH (ref 70–99)
Potassium: 4.6 mmol/L (ref 3.5–5.1)
Sodium: 132 mmol/L — ABNORMAL LOW (ref 135–145)

## 2021-05-16 LAB — GLUCOSE, CAPILLARY
Glucose-Capillary: 155 mg/dL — ABNORMAL HIGH (ref 70–99)
Glucose-Capillary: 178 mg/dL — ABNORMAL HIGH (ref 70–99)
Glucose-Capillary: 208 mg/dL — ABNORMAL HIGH (ref 70–99)
Glucose-Capillary: 223 mg/dL — ABNORMAL HIGH (ref 70–99)

## 2021-05-16 MED ORDER — SODIUM BICARBONATE 650 MG PO TABS
1300.0000 mg | ORAL_TABLET | Freq: Three times a day (TID) | ORAL | Status: DC
  Administered 2021-05-16 (×3): 1300 mg via ORAL
  Filled 2021-05-16 (×4): qty 2

## 2021-05-16 MED ORDER — ENSURE ENLIVE PO LIQD
237.0000 mL | Freq: Three times a day (TID) | ORAL | Status: DC
  Administered 2021-05-16 – 2021-05-17 (×2): 237 mL via ORAL

## 2021-05-16 NOTE — Progress Notes (Signed)
Daily Progress Note   Patient Name: Sheila Morgan       Date: 05/16/2021 DOB: Sep 15, 1932  Age: 88 y.o. MRN#: [id number] Attending Physician: Tawni Millers Primary Care Physician: Binnie Rail, MD Admit Date: 05/09/2021  Reason for Consultation/Follow-up: Establishing goals of care  Subjective: Sitting up in char, denies complaints, RN at bedside tells me she is slightly more alert today, consumed about 25% of breakfast  Length of Stay: 6  Current Medications: Scheduled Meds:   allopurinol  100 mg Oral Q M,W,F   apixaban  2.5 mg Oral BID   calcium-vitamin D  1 tablet Oral BID   Chlorhexidine Gluconate Cloth  6 each Topical Daily   ferrous gluconate  324 mg Oral Q breakfast   Gerhardt's butt cream   Topical BID   hydrocortisone sod succinate (SOLU-CORTEF) inj  75 mg Intravenous Q12H   insulin aspart  0-5 Units Subcutaneous QHS   insulin aspart  0-9 Units Subcutaneous TID WC   levothyroxine  25 mcg Intravenous Daily   multivitamin with minerals  1 tablet Oral Daily   nystatin  1 application Topical Daily   pantoprazole  40 mg Oral Daily   simvastatin  40 mg Oral QHS   sodium bicarbonate  1,300 mg Oral TID   sodium chloride flush  3 mL Intravenous Q12H    Continuous Infusions:  sodium chloride     thiamine injection 250 mg (05/16/21 1104)    PRN Meds: sodium chloride, acetaminophen, ondansetron (ZOFRAN) IV, sodium chloride flush  Physical Exam Constitutional:      General: She is not in acute distress.    Comments: Drowsy but more interactive than yesterday Sitting up in chair  Pulmonary:     Effort: Pulmonary effort is normal.  Musculoskeletal:     Right lower leg: Edema present.     Left lower leg: Edema present.  Skin:    General: Skin is warm and dry.   Neurological:     Comments: Difficult to assess orientation d/t drowsiness            Vital Signs: BP (!) 140/95    Pulse 85    Temp 97.6 F (36.4 C) (Oral)    Resp (!) 24    Ht 5\' 6"  (1.676 m)    Wt 79.8 kg    SpO2 94%    BMI 28.40 kg/m  SpO2: SpO2: 94 % O2 Device: O2 Device: Room Air O2 Flow Rate: O2 Flow Rate (L/min): 0 L/min  Intake/output summary:  Intake/Output Summary (Last 24 hours) at 05/16/2021 1231 Last data filed at 05/16/2021 0753 Gross per 24 hour  Intake 922.57 ml  Output 190 ml  Net 732.57 ml   LBM: Last BM Date: 05/12/21 Baseline Weight: Weight: 74 kg Most recent weight: Weight: 79.8 kg       Palliative Assessment/Data: PPS 40%    Flowsheet Rows    Flowsheet Row Most Recent Value  Intake Tab   Referral Department Critical care  Unit at Time of Referral Intermediate Care Unit  Palliative Care Primary Diagnosis Cardiac  Date Notified 05/14/21  Palliative Care Type New Palliative care  Reason for referral Clarify Goals of Care  Date  of Admission 05/09/21  Date first seen by Palliative Care 05/15/21  # of days Palliative referral response time 1 Day(s)  # of days IP prior to Palliative referral 5  Clinical Assessment   Psychosocial & Spiritual Assessment   Palliative Care Outcomes        Patient Active Problem List   Diagnosis Date Noted   Malnutrition of moderate degree 05/14/2021   Melena    Bradycardia 05/11/2021   Hypotension 05/11/2021   Hypoalbuminemia 05/09/2021   Acute on chronic diastolic CHF (congestive heart failure) ([place]) 05/09/2021   Acute respiratory failure with hypoxia ([place]) 04/19/2021   Acute encephalopathy 04/19/2021   Acute renal failure superimposed on stage 3a chronic kidney disease ([place]) 04/19/2021   Coagulopathy ([place]) 04/19/2021   SIRS (systemic inflammatory response syndrome) ([place]) 04/19/2021   COVID 04/05/2021   Abrasion of right hand 01/13/2021   A-fib ([place]) 12/31/2020   Foot swelling 09/10/2020   Calcium  pyrophosphate arthropathy of multiple sites 05/14/2020   Small vessel disease ([place]) 02/12/2020   Left leg DVT ([place]), 12/15/19 12/15/2019   Urinary frequency 02/22/2019   Fall 07/14/2018   Bilateral hearing loss 01/05/2018   Memory difficulties 01/05/2018   Multiple closed fractures of ribs of right side 01/19/2017   Acute pain of right shoulder 01/05/2017   Acute pain of right knee 01/05/2017   B12 deficiency 06/13/2015   Hypothyroidism 06/13/2015   DM (diabetes mellitus) type II, controlled, with peripheral vascular disorder ([place]) 03/06/2014   Gout 10/15/2013   Arthritis    Hiatal hernia    Coronary artery disease    Left knee DJD    Benign paroxysmal positional vertigo 05/19/2013   Weakness generalized 05/09/2013   Pseudogout of left knee 03/29/2013   Acute right lumbar radiculopathy 03/18/2013   Shoulder impingement syndrome 01/25/2013   Vertigo 10/17/2012   Renal insufficiency, mild 06/20/2011   Essential hypertension 12/31/2009   Osteoporosis - Dr Kathlene November 12/31/2009   SKIN CANCER, HX OF 12/31/2009   Proteinuria 01/05/2008   Normocytic anemia 12/21/2007   Hyperlipidemia 09/08/2006   Polymyalgia rheumatica ([place]) 09/08/2006   CORONARY ARTERY BYPASS GRAFT, HX OF 09/08/2006    Palliative Care Assessment & Plan   HPI: 88 y.o. female  with past medical history of  diastolic heart failure, HTN, T2DM, CAD sp CABG and dementia  admitted on 05/09/2021 with lethargy. Recent hospitalizations for COVID on 04/05/21 and heart failure 04/18/21. This admission, found to have lower extremity edema and bradycardia. Patient was placed on furosemide for diuresis plus albumin infusions. On 1/8, patient developed severe bradycardia and hypotension, along with worsening mentation. Transferred to ICU and placed on IV dobutamine and norepinephrine. 1/9 had melena and anemia, EGD revealed mild gastritis with no active bleeding. She has since been weaned off pressors and transferred back to ICU. Patient also  with AKI on CKD. Patient with ongoing lethargy and poor PO intake. PMT consulted to discuss [place].   Of note, in [place] patient had MOST form signed by daughter that was completed recently - 04/24/21. MOST indicated wishes for full code, full scope treatment, and feeding tube for trial.   Assessment: Patient slightly more alert today, PO intake improved from yesterday yet remains poor. Steroids restarted yesterday. Update received from RN.  Follow up with daughter today - reviewed patient's clinical condition this morning. Reviewed need for mechanical lift to get patient to chair. Discussed patient is still quite ill though she seems some improved from yesterday. Daughter is grateful for update and  expresses understanding of situation.  She would like to continue current course of care and allow time for outcomes. We discuss palliative medicine team will follow up with her early next week.   Recommendations/Plan: Full code/full scope Allow time for outcomes Family would be open to trial of feeding tube if warranted PMT will follow up early next week  Code Status: Full code  Care plan was discussed with RN and daughter  Thank you for allowing the Palliative Medicine Team to assist in the care of this patient.   Total Time 35 minutes Prolonged Time Billed  no       Greater than 50%  of this time was spent counseling and coordinating care related to the above assessment and plan.  Juel Burrow, DNP, [facility] Palliative Medicine Team Team Phone # [phone]  Pager [phone]

## 2021-05-16 NOTE — Progress Notes (Signed)
Physical Therapy Treatment Patient Details Name: Sheila Morgan MRN: [id number] DOB: 06-12-1932 Today's Date: 05/16/2021   History of Present Illness Pt is an 88 y.o. female admitted from SNF on 05/09/21 with acute on chronic CHF. Pt developed bradycardia and HTN on 1/8. Pt with decline in Hgb and concern for upper GI bleed. EGD 1/9 showed gatritis but no active bleeding. PMH includes COVID (admission 04/2021 for this with d/c to SNF rehab), CAD s/p CABG, DM, afib, hypothyroidism, DVT, CKD.   PT Comments    Pt progressing with mobility. Today's session focused on OOB mobility and standing trials; pt tolerated maximove lift to recliner, partial standing with maxA+2. Pt remains limited by generalized weakness, decreased activity tolerance, and impaired cognition. Continue to recommend return to SNF-level therapies to maximize functional mobility and independence.    Recommendations for follow up therapy are one component of a multi-disciplinary discharge planning process, led by the attending physician.  Recommendations may be updated based on patient status, additional functional criteria and insurance authorization.  Follow Up Recommendations  Skilled nursing-short term rehab (<3 hours/day)     Assistance Recommended at Discharge Frequent or constant Supervision/Assistance  Patient can return home with the following Other (comment);A lot of help with bathing/dressing/bathroom;Two people to help with walking and/or transfers (use of mechanical lift)   Equipment Recommendations  Wheelchair (measurements PT);Wheelchair cushion (measurements PT);Hospital bed;Mechnical lift   Recommendations for Other Services       Precautions / Restrictions Precautions Precautions: Fall Precaution Comments: watch HR, on RA Restrictions Weight Bearing Restrictions: No     Mobility  Bed Mobility Overal bed mobility: Needs Assistance Bed Mobility: Rolling Rolling: Max assist         General bed  mobility comments: pt does initiated bed mobility better toward the R than L side. pt reaching arm and turning head and attempting hip flexion. maxA to fully roll and sustain movement    Transfers Overall transfer level: Needs assistance Equipment used: 2 person hand held assist Transfers: Bed to chair/wheelchair/BSC;Sit to/from Stand Sit to Stand: Max assist;From elevated surface;+2 safety/equipment;+2 physical assistance           General transfer comment: Initial maximove lift from bed to recliner; once in recliner, trialled 2x sit<>stand with BUE support, pt initiating movements to scoot forward but requires assist to complete tasks, maxA+2 to offload buttocks and initiate stand, unable to achieve fully upright posture/trunk extension Transfer via Lift Equipment: Maximove  Ambulation/Gait                   Stairs             Wheelchair Mobility    Modified Rankin (Stroke Patients Only)       Balance Overall balance assessment: Needs assistance Sitting-balance support: Bilateral upper extremity supported;Feet supported Sitting balance-Leahy Scale: Poor Sitting balance - Comments: reliant on bil UE on arm rest to sustain anterior weight shift off chair back   Standing balance support: Bilateral upper extremity supported;During functional activity Standing balance-Leahy Scale: Zero Standing balance comment: dependent on therapists                            Cognition Arousal/Alertness: Awake/alert Behavior During Therapy: Flat affect Overall Cognitive Status: History of cognitive impairments - at baseline  General Comments: H/o dementia at baseline. Pt with minimal responses; does follow majority of simple commands (i.e. rolling, washing face, scooting forward) with some increased time to initiate task        Exercises Other Exercises Other Exercises: hand flexion / extension of digits on L hand  due to edema    General Comments General comments (skin integrity, edema, etc.): VSS, increased RR with movement but sustained RA 96% >      Pertinent Vitals/Pain Pain Assessment: Faces Faces Pain Scale: Hurts little more Pain Location: L knee with standing trials Pain Descriptors / Indicators: Grimacing;Guarding Pain Intervention(s): Monitored during session    Home Living                          Prior Function            PT Goals (current goals can now be found in the care plan section) Progress towards PT goals: Progressing toward goals    Frequency    Min 2X/week      PT Plan Current plan remains appropriate    Co-evaluation PT/OT/SLP Co-Evaluation/Treatment: Yes Reason for Co-Treatment: Complexity of the patient's impairments (multi-system involvement);Necessary to address cognition/behavior during functional activity;For patient/therapist safety;To address functional/ADL transfers PT goals addressed during session: Mobility/safety with mobility OT goals addressed during session: ADL's and self-care;Proper use of Adaptive equipment and DME;Strengthening/ROM      AM-PAC PT "6 Clicks" Mobility   Outcome Measure  Help needed turning from your back to your side while in a flat bed without using bedrails?: A Lot Help needed moving from lying on your back to sitting on the side of a flat bed without using bedrails?: A Lot Help needed moving to and from a bed to a chair (including a wheelchair)?: Total Help needed standing up from a chair using your arms (e.g., wheelchair or bedside chair)?: Total Help needed to walk in hospital room?: Total Help needed climbing 3-5 steps with a railing? : Total 6 Click Score: 8    End of Session   Activity Tolerance: Patient tolerated treatment well Patient left: in chair;with call bell/phone within reach;with chair alarm set Nurse Communication: Mobility status;Need for lift equipment PT Visit Diagnosis: Muscle  weakness (generalized) (M62.81);Difficulty in walking, not elsewhere classified (R26.2)     Time: 6295-2841 PT Time Calculation (min) (ACUTE ONLY): 25 min  Charges:  $Therapeutic Activity: 8-22 mins                     Mabeline Caras, PT, DPT Acute Rehabilitation Services  Pager [phone] Office [phone]  Derry Lory 05/16/2021, 10:36 AM

## 2021-05-16 NOTE — Progress Notes (Signed)
Occupational Therapy Treatment Patient Details Name: Sheila Morgan MRN: [id number] DOB: [birth year] Today's Date: 05/16/2021   History of present illness 88 yo female adm 1/6 from SNF with acute on chronic CHF. Pt developed bradycardia and HTN on 1/8. Pt with decline in Hgb and concern for upper GI bleed. EGD 1/9 showed gatritis but no active bleeding. PMH - covid, CAD, CABG, DM, afib, hypothyroidism, history of DVT, CKD   OT comments  Pt progressed from bed level to chair level this session via hoyer. Recommendation for (A) for feeding at meals. Pt noted to have edema throughout all extremities. Pt noted to have redness in peri area and pressure marks from [place] ( to the point of seeing the pattern of the fabric of the purewick in skin). Purewick removed at this time to allow skin time to have decreased pressure. Recommendation SNF at this time.    Recommendations for follow up therapy are one component of a multi-disciplinary discharge planning process, led by the attending physician.  Recommendations may be updated based on patient status, additional functional criteria and insurance authorization.    Follow Up Recommendations  Skilled nursing-short term rehab (<3 hours/day)    Assistance Recommended at Discharge Frequent or constant Supervision/Assistance  Patient can return home with the following  Two people to help with bathing/dressing/bathroom;Two people to help with walking and/or transfers;Assistance with feeding   Equipment Recommendations  Wheelchair cushion (measurements OT);Wheelchair (measurements OT);Hospital bed;Other (comment) (hoyer)    Recommendations for Other Services      Precautions / Restrictions Precautions Precautions: Fall Precaution Comments: watch HR, on RA Restrictions Weight Bearing Restrictions: No       Mobility Bed Mobility Overal bed mobility: Needs Assistance Bed Mobility: Rolling Rolling: Max assist         General bed mobility  comments: pt does initiated bed mobility better toward the R than L side. pt reaching iwth arm and turning head and attempting hip flexion. pt needs (A) to sustain movement.    Transfers Overall transfer level: Needs assistance Equipment used: 2 person hand held assist   Sit to Stand: Max assist;From elevated surface;+2 safety/equipment;+2 physical assistance           General transfer comment: hoyer lift to chair and x2 attempts at sit,>Stand. Pt initiates all movements to scoot toward edge chair requires pad to (A). pt unable to sustain standing with reaching L knee afterward.     Balance Overall balance assessment: Needs assistance Sitting-balance support: Bilateral upper extremity supported;Feet supported Sitting balance-Leahy Scale: Poor Sitting balance - Comments: reliant on bil UE on arm rest to sustain anterior weight shift off chair back     Standing balance-Leahy Scale: Zero Standing balance comment: dependent on therapists                           ADL either performed or assessed with clinical judgement   ADL Overall ADL's : Needs assistance/impaired Eating/Feeding: Minimal assistance;Sitting Eating/Feeding Details (indicate cue type and reason): drinking milk with bil hands and requires (A) to sustain holding Grooming: Wash/dry hands;Minimal assistance;Supervision/safety   Upper Body Bathing: Maximal assistance   Lower Body Bathing: Total assistance   Upper Body Dressing : Maximal assistance   Lower Body Dressing: Total assistance                 General ADL Comments: pt transfered oob to chair and then attempted sit<>Stand x2 during session. pt noted to have feet  inititally too far advanaced and required second attempt    Extremity/Trunk Assessment Upper Extremity Assessment Upper Extremity Assessment: Generalized weakness RUE Deficits / Details: edema noted in forearm decreased shoulder flexion to 50 degrees LUE Deficits / Details:  dementia throughout hand noted new bandage on hand ( question IV source of edema?) pt able to move all digits, noted to have 5th digit contracture   Lower Extremity Assessment Lower Extremity Assessment: Defer to PT evaluation        Vision       Perception     Praxis      Cognition Arousal/Alertness: Awake/alert Behavior During Therapy: Flat affect Overall Cognitive Status: History of cognitive impairments - at baseline                                 General Comments: pt with minimal responses. does follow simple commands to reach, washing face.          Exercises Exercises: Other exercises Other Exercises Other Exercises: hand flexion / extension of digits on L hand due to edema   Shoulder Instructions       General Comments VSS, increased RR with movement but sustained RA 96% >    Pertinent Vitals/ Pain       Pain Assessment: Faces Faces Pain Scale: Hurts little more Pain Location: L knee Pain Descriptors / Indicators: Grimacing Pain Intervention(s): Other (comment) (rubbing L knee after standing attempts)  Home Living                                          Prior Functioning/Environment              Frequency  Min 2X/week        Progress Toward Goals  OT Goals(current goals can now be found in the care plan section)  Progress towards OT goals: Progressing toward goals  Acute Rehab OT Goals Patient Stated Goal: none stated, very few verbalizations OT Goal Formulation: Patient unable to participate in goal setting Time For Goal Achievement: 05/27/21 Potential to Achieve Goals: Good ADL Goals Pt Will Perform Grooming: with min guard assist;standing Pt Will Perform Upper Body Bathing: with set-up;sitting Pt Will Perform Upper Body Dressing: with min assist;sitting Pt Will Perform Lower Body Dressing: with mod assist;sit to/from stand Pt Will Transfer to Toilet: with mod assist;with +2 assist;stand pivot  transfer;bedside commode Pt Will Perform Toileting - Clothing Manipulation and hygiene: with supervision;with caregiver independent in assisting;with adaptive equipment Additional ADL Goal #1: Pt will perform bed mobility with Min A +2 in prepation for ADLs Additional ADL Goal #2: Patient Regis Bill will identify at least 3 energy conservation strategies to employ at home in order to maximize function and quality of life and decrease caregiver burden while preventing exacerbation of symptoms and rehospitalization.  Plan Discharge plan remains appropriate    Co-evaluation    PT/OT/SLP Co-Evaluation/Treatment: Yes Reason for Co-Treatment: Complexity of the patient's impairments (multi-system involvement);For patient/therapist safety;To address functional/ADL transfers   OT goals addressed during session: ADL's and self-care;Proper use of Adaptive equipment and DME;Strengthening/ROM      AM-PAC OT "6 Clicks" Daily Activity     Outcome Measure   Help from another person eating meals?: A Lot Help from another person taking care of personal grooming?: A Lot Help from another person toileting, which  includes using toliet, bedpan, or urinal?: A Lot Help from another person bathing (including washing, rinsing, drying)?: A Lot Help from another person to put on and taking off regular upper body clothing?: A Lot Help from another person to put on and taking off regular lower body clothing?: A Lot 6 Click Score: 12    End of Session    OT Visit Diagnosis: Unsteadiness on feet (R26.81);Other symptoms and signs involving cognitive function;History of falling (Z91.81);Muscle weakness (generalized) (M62.81)   Activity Tolerance Patient tolerated treatment well   Patient Left in chair;with call bell/phone within reach;with chair alarm set   Nurse Communication Mobility status;Precautions;Need for lift equipment        Time: [phone] OT Time Calculation (min): 23 min  Charges: OT General  Charges $OT Visit: 1 Visit OT Treatments $Self Care/Home Management : 8-22 mins   Brynn, OTR/L  Acute Rehabilitation Services Pager: [phone] Office: [phone] .   Jeri Modena 05/16/2021, 10:14 AM

## 2021-05-16 NOTE — Progress Notes (Signed)
PROGRESS NOTE    Sheila Morgan  TDD:[id number] DOB: 1933/04/18 DOA: 05/09/2021 PCP: Binnie Rail, MD    Brief Narrative:  [name] was admitted to the hospital with the working diagnosis of cardiogenic shock due to bradycardia.    88 yo female with the past medical history of diastolic heart failure, HTN, T2DM, CAD sp CABG and dementia who presented with lethargy. Recent hospitalization for COVID on 04/05/21 and heart failure 04/18/21. She was transferred from SNF due to lower extremity edema, bradycardia and lethargy. On her initial physical examination her blood pressure was 125/59, HR 53 to 116, RR 26 and oxygen saturation 99%, her lungs were clear to auscultation, heart with S1 and S2 present and irregular, abdomen soft and positive lower extremity edema.  Patient was somnolent but easy to arouse.    Na 135, K 4,1, cl 106, bicarb 19, glucose 106, bun 12 and cr 1,28 Ca 8,3 Wbc 5,8, hgb 8,9, hct 30,4 and plt clumped.  SARS COVID 19 negative   Urine analysis with sg 1,015 negative for leukocytes.    Head CT with no acute changes, positive atrophy    Chest radiograph with bilateral atelectasis at bases.    EKG 49 bpm, with normal axis and normal qtc, atrial fibrillation rhythm, with no significant ST segment or T wave changes.    Patient was placed on furosemide for diuresis plus albumin infusions.    01/08 patient developed severe bradycardia and hypotension, along with worsening mentation.  Patient was placed on IV dobutamine and norepinephrine with good toleration.    01/09 positive melena and acute blood loss anemia, received 1 unit PRBC and GI was consulted.  EGD with mild gastritis with no active bleeding    01/10 wean off vasopressors and 01/11 transfer back to [facility].    Patient with inappropriate low random cortisol, placed on stress dose steroids.    Assessment & Plan:   Principal Problem:   Acute on chronic diastolic CHF (congestive heart failure) (HCC) Active  Problems:   Essential hypertension   CORONARY ARTERY BYPASS GRAFT, HX OF   Coronary artery disease   DM (diabetes mellitus) type II, controlled, with peripheral vascular disorder (HCC)   A-fib (HCC)   Hypoalbuminemia   Bradycardia   Hypotension   Malnutrition of moderate degree   Acute on chronic diastolic heart failure, pulmonary hypertension, chronic core pulmonale.  Continue to have edema, third spacing.    Echocardiogram with LV systolic function preserved with EF 55 to 60%, moderate LVH. RV mild reduction in systolic function. Mild elevated pulmonary systolic pressure. Severe tricuspid regurgitation.    Today she is more awake, will discontinue IV fluids and will continue to monitor blood pressure.    2. Chronic atrial fibrillation with slow ventricular rate.  Slow atria fibrillation, rate controlled.  Holding AV blockade, (at home on diltiazem and carvedilol).  Anticoagulation with apixaban.  3. AKI on CKD. Hypomagnesemia, hyponatremia. Non anion gap metabolic acidosis Renal function with serum cr at 2,0 with K at 4,6 and serum bicarbonate at 18, anion gap is 8. CL 106 NA 132  Discontinue NS and continue to encourage po intake. Add oral sodium bicarbonate to non anion gap metabolic acidosis.  Follow up renal function in am, avid hypotension and nephrotoxic medications.    4. Metabolic encephalopathy. Swallow dysfunction. Acute adrenal insufficiency, symptomatic hypothyroid, mixedema.  Patient has been placed on stress dose steroids, her random cortisol inappropriate low. Now on IV levothyroxine to improve bioavailability of medication.  Her mentation has improved today.  Continue with dysphagia 3 diet.    5. Acute blood loss anemia, acute gastritis. Iron deficiency  On pantoprazole, hgb now is stable, after  pRBC. Hgb today 10,3 and hct 31,9  On iron supplementation    6. Moderate calorie protein malnutrition/ pressure ulcer sacrum stage 2 present on admission.  Nutritional supplements.  Continue with thiamine high dose and multivitamins.    7. Gout. No acute flare, continue with allopurinol.    8. T2DM. Fasting glucose this am 152, now on stress dose steroids, continue glucose cover and monitoring with insulin sliding scale.  Continue to encourage po intake.     Patient continue to be at high risk for worsening hypothyroid and adrenal insufficiency   Status is: Inpatient  Remains inpatient appropriate because: IV stress dose steroids.    DVT prophylaxis:  Apixaban   Code Status:   full  Family Communication:  I spoke over the phone with the patient's daughter about patient's  condition, plan of care, prognosis and all questions were addressed.     Nutrition Status: Nutrition Problem: Moderate Malnutrition Etiology: chronic illness (dementia, CHF, CKD) Signs/Symptoms: mild fat depletion, moderate muscle depletion Interventions: Ensure Enlive (each supplement provides 350kcal and 20 grams of protein), MVI, Other (Comment), Refer to RD note for recommendations (feeding assistance)     Skin Documentation: Pressure Injury 04/19/21 Sacrum Lower;Posterior Unstageable - Full thickness tissue loss in which the base of the injury is covered by slough (yellow, tan, gray, green or brown) and/or eschar (tan, brown or black) in the wound bed. wound consult placed (Active)  04/19/21 1755  Location: Sacrum  Location Orientation: Lower;Posterior  Staging: Unstageable - Full thickness tissue loss in which the base of the injury is covered by slough (yellow, tan, gray, green or brown) and/or eschar (tan, brown or black) in the wound bed.  Wound Description (Comments): wound consult placed  Present on Admission: Yes     Subjective: Patient is more awake and alert, no nausea or vomiting, no chest pain or dyspnea.   Objective: Vitals:   05/15/21 2353 05/16/21 0305 05/16/21 0630 05/16/21 0755  BP: (!) 143/81 132/71  (!) 140/95  Pulse: 90 68  85   Resp: (!) 23 (!) 21  (!) 24  Temp: 98.2 F (36.8 C) 97.6 F (36.4 C)  97.6 F (36.4 C)  TempSrc: Oral Oral  Oral  SpO2: 99% 94%  94%  Weight:   79.8 kg   Height:        Intake/Output Summary (Last 24 hours) at 05/16/2021 0933 Last data filed at 05/16/2021 0753 Gross per 24 hour  Intake 1072.48 ml  Output 190 ml  Net 882.48 ml   Filed Weights   05/14/21 0443 05/15/21 0323 05/16/21 0630  Weight: 78.1 kg 79.2 kg 79.8 kg    Examination:   General: Not in pain or dyspnea, deconditioned  Neurology: somnolent but improved from yesterday, more reactive and interactive.  E ENT: mild pallor, no icterus, oral mucosa moist Cardiovascular: No JVD. S1-S2 present, irregularly irregular.  Compression stockings on her lower extremity edema, positive diffuse edema.  Pulmonary: positive  breath sounds bilaterally, Gastrointestinal. Abdomen soft and non tender Skin. No rashes Musculoskeletal: no joint deformities    Data Reviewed: I have personally reviewed following labs and imaging studies  CBC: Recent Labs  Lab 05/09/21 1347 05/09/21 1439 05/11/21 0321 05/11/21 1820 05/11/21 1839 05/12/21 0116 05/12/21 1631 05/13/21 0406 05/14/21 0058  WBC 5.8  --  7.2 10.8*  --  8.6 9.4 7.3 5.8  NEUTROABS 3.9  --  5.6  --   --   --   --   --   --   HGB 8.9*   < > 7.8* 8.1* 7.8* 7.8* 10.8* 11.7* 10.3*  HCT 30.4*   < > 25.6* 26.4* 23.0* 25.5* 32.9* 36.3 31.9*  MCV 101.3*  --  96.6 96.7  --  96.2 94.3 95.5 93.3  PLT PLATELET CLUMPS NOTED ON SMEAR, UNABLE TO ESTIMATE  --  232 210  --  179 190 181 184   < > = values in this interval not displayed.   Basic Metabolic Panel: Recent Labs  Lab 05/09/21 1347 05/09/21 1439 05/12/21 1631 05/13/21 0115 05/14/21 0058 05/15/21 0129 05/16/21 0121  NA 135   < > 125* 135 134* 134* 132*  K 4.1   < > 3.6 4.0 4.1 4.2 4.6  CL 106   < > 94* 106 105 105 106  CO2 19*   < > 21* 19* 21* 21* 18*  GLUCOSE 106*   < > 489* 94 111* 87 152*  BUN 12   < > 16  20 25* 26* 26*  CREATININE 1.28*   < > 1.88* 2.03* 2.21* 2.18* 2.08*  CALCIUM 8.3*   < > 7.7* 8.6* 8.7* 8.4* 8.8*  MG 1.7  --  1.5* 1.5* 2.8*  --   --    < > = values in this interval not displayed.   GFR: Estimated Creatinine Clearance: 20.7 mL/min (A) (by C-G formula based on SCr of 2.08 mg/dL (H)). Liver Function Tests: Recent Labs  Lab 05/09/21 1347  AST 26  ALT 21  ALKPHOS 127*  BILITOT 1.0  PROT 4.7*  ALBUMIN 1.8*   No results for input(s): LIPASE, AMYLASE in the last 168 hours. No results for input(s): AMMONIA in the last 168 hours. Coagulation Profile: Recent Labs  Lab 05/12/21 0956  INR 1.9*   Cardiac Enzymes: No results for input(s): CKTOTAL, CKMB, CKMBINDEX, TROPONINI in the last 168 hours. BNP (last 3 results) No results for input(s): PROBNP in the last 8760 hours. HbA1C: No results for input(s): HGBA1C in the last 72 hours. CBG: Recent Labs  Lab 05/15/21 0558 05/15/21 1110 05/15/21 1653 05/15/21 2137 05/16/21 0628  GLUCAP 89 86 99 117* 155*   Lipid Profile: No results for input(s): CHOL, HDL, LDLCALC, TRIG, CHOLHDL, LDLDIRECT in the last 72 hours. Thyroid Function Tests: No results for input(s): TSH, T4TOTAL, FREET4, T3FREE, THYROIDAB in the last 72 hours. Anemia Panel: No results for input(s): VITAMINB12, FOLATE, FERRITIN, TIBC, IRON, RETICCTPCT in the last 72 hours.    Radiology Studies: I have reviewed all of the imaging during this hospital visit personally     Scheduled Meds:  allopurinol  100 mg Oral Q M,W,F   apixaban  2.5 mg Oral BID   calcium-vitamin D  1 tablet Oral BID   Chlorhexidine Gluconate Cloth  6 each Topical Daily   docusate sodium  200 mg Oral Daily   ferrous gluconate  324 mg Oral Q breakfast   Gerhardt's butt cream   Topical BID   hydrocortisone sod succinate (SOLU-CORTEF) inj  75 mg Intravenous Q12H   insulin aspart  0-5 Units Subcutaneous QHS   insulin aspart  0-9 Units Subcutaneous TID WC   levothyroxine  25  mcg Intravenous Daily   multivitamin with minerals  1 tablet Oral Daily   nystatin  1 application Topical Daily   pantoprazole  40 mg Oral Daily   simvastatin  40 mg Oral QHS   sodium chloride flush  3 mL Intravenous Q12H   Continuous Infusions:  sodium chloride     sodium chloride Stopped (05/12/21 1959)   sodium chloride 50 mL/hr at 05/16/21 0305   thiamine injection 250 mg (05/15/21 2145)     LOS: 6 days        Courtny Bennison Gerome Apley, MD

## 2021-05-17 LAB — BASIC METABOLIC PANEL
Anion gap: 10 (ref 5–15)
BUN: 35 mg/dL — ABNORMAL HIGH (ref 8–23)
CO2: 21 mmol/L — ABNORMAL LOW (ref 22–32)
Calcium: 9.2 mg/dL (ref 8.9–10.3)
Chloride: 103 mmol/L (ref 98–111)
Creatinine, Ser: 2.22 mg/dL — ABNORMAL HIGH (ref 0.44–1.00)
GFR, Estimated: 21 mL/min — ABNORMAL LOW (ref >60)
Glucose, Bld: 214 mg/dL — ABNORMAL HIGH (ref 70–99)
Potassium: 4.9 mmol/L (ref 3.5–5.1)
Sodium: 134 mmol/L — ABNORMAL LOW (ref 135–145)

## 2021-05-17 LAB — GLUCOSE, CAPILLARY
Glucose-Capillary: 175 mg/dL — ABNORMAL HIGH (ref 70–99)
Glucose-Capillary: 200 mg/dL — ABNORMAL HIGH (ref 70–99)
Glucose-Capillary: 268 mg/dL — ABNORMAL HIGH (ref 70–99)
Glucose-Capillary: 203 mg/dL — ABNORMAL HIGH (ref 70–99)
Glucose-Capillary: 183 mg/dL — ABNORMAL HIGH (ref 70–99)

## 2021-05-17 MED ORDER — SODIUM CHLORIDE 0.45 % IV SOLN: INTRAVENOUS | Status: DC

## 2021-05-17 MED ORDER — INSULIN ASPART 100 UNIT/ML IJ SOLN
0.0000 [IU] | Freq: Three times a day (TID) | INTRAMUSCULAR | Status: DC
  Administered 2021-05-17: 5 [IU] via SUBCUTANEOUS
  Administered 2021-05-18 – 2021-05-19 (×5): 3 [IU] via SUBCUTANEOUS
  Administered 2021-05-19: 5 [IU] via SUBCUTANEOUS
  Administered 2021-05-20: 3 [IU] via SUBCUTANEOUS

## 2021-05-17 NOTE — Progress Notes (Signed)
RN attempted several times to give patient her medication.  Meds crushed in apple sauce and pudding, patient continued to spit the apple sauce and pudding out of her mouth.  Patient would state "no" and shake her head, patient would close mouth tightly and shake her head no.

## 2021-05-17 NOTE — Progress Notes (Signed)
PROGRESS NOTE    Sheila Morgan  OIZ:[id number] DOB: May 13, 1932 DOA: 05/09/2021 PCP: Binnie Rail, MD    Brief Narrative:  Sheila Morgan was admitted to the hospital with the working diagnosis of cardiogenic shock due to bradycardia, in the setting of adrenal insufficiency.    88 yo female with the past medical history of diastolic heart failure, HTN, T2DM, CAD sp CABG and dementia who presented with lethargy. Recent hospitalization for COVID on 04/05/21 and heart failure 04/18/21. She was transferred from SNF due to lower extremity edema, bradycardia and lethargy. On her initial physical examination her blood pressure was 125/59, HR 53 to 116, RR 26 and oxygen saturation 99%, her lungs were clear to auscultation, heart with S1 and S2 present and irregular, abdomen soft and positive lower extremity edema.  Patient was somnolent but easy to arouse.    Na 135, K 4,1, cl 106, bicarb 19, glucose 106, bun 12 and cr 1,28 Ca 8,3 Wbc 5,8, hgb 8,9, hct 30,4 and plt clumped.  SARS COVID 19 negative   Urine analysis with sg 1,015 negative for leukocytes.    Head CT with no acute changes, positive atrophy    Chest radiograph with bilateral atelectasis at bases.    EKG 49 bpm, with normal axis and normal qtc, atrial fibrillation rhythm, with no significant ST segment or T wave changes.    Patient was placed on furosemide for diuresis plus albumin infusions.    01/08 patient developed severe bradycardia and hypotension, along with worsening mentation.  Patient was placed on IV dobutamine and norepinephrine with good toleration.    01/09 positive melena and acute blood loss anemia, received 1 unit PRBC and GI was consulted.  EGD with mild gastritis with no active bleeding    01/10 wean off vasopressors and 01/11 transfer back to [facility].    Patient with inappropriate low random cortisol, placed on stress dose steroids.  Improving mentation and heart rate.   Assessment & Plan:   Principal  Problem:   Acute on chronic diastolic CHF (congestive heart failure) (HCC) Active Problems:   Essential hypertension   CORONARY ARTERY BYPASS GRAFT, HX OF   Coronary artery disease   DM (diabetes mellitus) type II, controlled, with peripheral vascular disorder (HCC)   A-fib (HCC)   Hypoalbuminemia   Bradycardia   Hypotension   Malnutrition of moderate degree   Acute on chronic diastolic heart failure, pulmonary hypertension, chronic core pulmonale.  Echocardiogram with LV systolic function preserved with EF 55 to 60%, moderate LVH. RV mild reduction in systolic function. Mild elevated pulmonary systolic pressure. Severe tricuspid regurgitation.    Edema due to third spacing, continue to hold on diuretic therapy due to risk of hypotension and renal failure.  Continue blood pressure monitoring.    2. Chronic atrial fibrillation with slow ventricular rate.  Holding AV blockade, (at home on diltiazem and carvedilol).  Anticoagulation with apixaban. HR has been improving with steroids and IV levothyroxine.  HR now in the high 90 and low 100's  Ok to change level of care to telemetry   3. AKI on CKD. Hypomagnesemia, hyponatremia. Non anion gap metabolic acidosis Renal function this am with serum cr at 2,22 with K at 4,9 and Na at 134, bicarbonate has increased to 21. Poor oral intake. Continue close follow up on renal function and electrolytes. Will add 0,45 saline at 75 ml per hr and follow up renal function in am.  Discontinue oral bicarbonate for now.  4. Metabolic encephalopathy. Swallow dysfunction. Acute adrenal insufficiency, symptomatic hypothyroid, mixedema.  Clinically improving, her random cortisol was inappropriate low for her acute condition. Plan to continue with systemic stress dose steroids Continue with IV levothyroxine to allow better bioavailability    5. Acute blood loss anemia, acute gastritis. Iron deficiency  On pantoprazole, hgb now is stable, after   pRBC. Hgb today 10,3 and hct 31,9   Continue with iron supplementation    6. Moderate calorie protein malnutrition/ pressure ulcer sacrum stage 2 present on admission.  Continue with nutritional supplements.  Continue with thiamine high dose and multivitamins.    7. Gout. No signs of acute flare, continue with allopurinol.    8. T2DM.  Her fasting glucose now is 214, steroid induced hyperglycemia.  Continue glucose cover and monitoring with insulin sliding scale.  Continue to encourage po intake.  Patient continue with poor oral intake.,   Patient continue to be at high risk for worsening hemodynamic decompensation   Status is: Inpatient  Remains inpatient appropriate because: telemetry monitoring        DVT prophylaxis:  Apixaban   Code Status:    full  Family Communication:   No family at the bedside      Nutrition Status: Nutrition Problem: Moderate Malnutrition Etiology: chronic illness (dementia, CHF, CKD) Signs/Symptoms: mild fat depletion, moderate muscle depletion Interventions: Ensure Enlive (each supplement provides 350kcal and 20 grams of protein), MVI, Other (Comment), Refer to RD note for recommendations (feeding assistance)     Skin Documentation: Pressure Injury 04/19/21 Sacrum Lower;Posterior Unstageable - Full thickness tissue loss in which the base of the injury is covered by slough (yellow, tan, gray, green or brown) and/or eschar (tan, brown or black) in the wound bed. wound consult placed (Active)  04/19/21 1755  Location: Sacrum  Location Orientation: Lower;Posterior  Staging: Unstageable - Full thickness tissue loss in which the base of the injury is covered by slough (yellow, tan, gray, green or brown) and/or eschar (tan, brown or black) in the wound bed.  Wound Description (Comments): wound consult placed  Present on Admission: Yes      Subjective: Patient is more awake and alert, continue to be very weak and deconditioned, no nausea or  vomiting,   Objective: Vitals:   05/16/21 2036 05/17/21 0007 05/17/21 0317 05/17/21 0817  BP: (!) 150/86 (!) 161/99 (!) 149/95 (!) 140/98  Pulse: 79 85 100 91  Resp: 20 20 20 20   Temp: (!) 97.5 F (36.4 C) 97.7 F (36.5 C) 97.6 F (36.4 C) 98.3 F (36.8 C)  TempSrc: Oral Oral Oral Axillary  SpO2: 95% 96% 95% 97%  Weight:   81.5 kg   Height:        Intake/Output Summary (Last 24 hours) at 05/17/2021 0910 Last data filed at 05/17/2021 0830 Gross per 24 hour  Intake 410 ml  Output 50 ml  Net 360 ml   Filed Weights   05/15/21 0323 05/16/21 0630 05/17/21 0317  Weight: 79.2 kg 79.8 kg 81.5 kg    Examination:   General: deconditioned  Neurology: Awake and alert, non focal  E ENT: no pallor, no icterus, oral mucosa moist Cardiovascular: No JVD. S1-S2 present, irregularly irregular with no gallops, rubs, or murmurs. positive lower extremity edema, at the thighs, she has compression stockings bilateral legs. . Pulmonary: positive breath sounds bilaterally, with wheezing, rhonchi or rales. Gastrointestinal. Abdomen soft and non tender Skin. No rashes Musculoskeletal: no joint deformities     Data Reviewed: I  have personally reviewed following labs and imaging studies  CBC: Recent Labs  Lab 05/11/21 0321 05/11/21 1820 05/11/21 1839 05/12/21 0116 05/12/21 1631 05/13/21 0406 05/14/21 0058  WBC 7.2 10.8*  --  8.6 9.4 7.3 5.8  NEUTROABS 5.6  --   --   --   --   --   --   HGB 7.8* 8.1* 7.8* 7.8* 10.8* 11.7* 10.3*  HCT 25.6* 26.4* 23.0* 25.5* 32.9* 36.3 31.9*  MCV 96.6 96.7  --  96.2 94.3 95.5 93.3  PLT 232 210  --  179 190 181 914   Basic Metabolic Panel: Recent Labs  Lab 05/12/21 1631 05/13/21 0115 05/14/21 0058 05/15/21 0129 05/16/21 0121 05/17/21 0113  NA 125* 135 134* 134* 132* 134*  K 3.6 4.0 4.1 4.2 4.6 4.9  CL 94* 106 105 105 106 103  CO2 21* 19* 21* 21* 18* 21*  GLUCOSE 489* 94 111* 87 152* 214*  BUN 16 20 25* 26* 26* 35*  CREATININE 1.88* 2.03*  2.21* 2.18* 2.08* 2.22*  CALCIUM 7.7* 8.6* 8.7* 8.4* 8.8* 9.2  MG 1.5* 1.5* 2.8*  --   --   --    GFR: Estimated Creatinine Clearance: 19.6 mL/min (A) (by C-G formula based on SCr of 2.22 mg/dL (H)). Liver Function Tests: No results for input(s): AST, ALT, ALKPHOS, BILITOT, PROT, ALBUMIN in the last 168 hours. No results for input(s): LIPASE, AMYLASE in the last 168 hours. No results for input(s): AMMONIA in the last 168 hours. Coagulation Profile: Recent Labs  Lab 05/12/21 0956  INR 1.9*   Cardiac Enzymes: No results for input(s): CKTOTAL, CKMB, CKMBINDEX, TROPONINI in the last 168 hours. BNP (last 3 results) No results for input(s): PROBNP in the last 8760 hours. HbA1C: No results for input(s): HGBA1C in the last 72 hours. CBG: Recent Labs  Lab 05/16/21 1222 05/16/21 1612 05/16/21 2041 05/17/21 0608 05/17/21 0906  GLUCAP 178* 208* 223* 175* 200*   Lipid Profile: No results for input(s): CHOL, HDL, LDLCALC, TRIG, CHOLHDL, LDLDIRECT in the last 72 hours. Thyroid Function Tests: No results for input(s): TSH, T4TOTAL, FREET4, T3FREE, THYROIDAB in the last 72 hours. Anemia Panel: No results for input(s): VITAMINB12, FOLATE, FERRITIN, TIBC, IRON, RETICCTPCT in the last 72 hours.    Radiology Studies: I have reviewed all of the imaging during this hospital visit personally     Scheduled Meds:  allopurinol  100 mg Oral Q M,W,F   apixaban  2.5 mg Oral BID   calcium-vitamin D  1 tablet Oral BID   Chlorhexidine Gluconate Cloth  6 each Topical Daily   feeding supplement  237 mL Oral TID BM   ferrous gluconate  324 mg Oral Q breakfast   Gerhardt's butt cream   Topical BID   hydrocortisone sod succinate (SOLU-CORTEF) inj  75 mg Intravenous Q12H   insulin aspart  0-5 Units Subcutaneous QHS   insulin aspart  0-9 Units Subcutaneous TID WC   levothyroxine  25 mcg Intravenous Daily   multivitamin with minerals  1 tablet Oral Daily   nystatin  1 application Topical Daily    pantoprazole  40 mg Oral Daily   simvastatin  40 mg Oral QHS   sodium bicarbonate  1,300 mg Oral TID   sodium chloride flush  3 mL Intravenous Q12H   Continuous Infusions:  sodium chloride     thiamine injection 250 mg (05/17/21 0058)     LOS: 7 days        Madelina Sanda Gerome Apley,  MD

## 2021-05-18 LAB — GLUCOSE, CAPILLARY
Glucose-Capillary: 184 mg/dL — ABNORMAL HIGH (ref 70–99)
Glucose-Capillary: 192 mg/dL — ABNORMAL HIGH (ref 70–99)
Glucose-Capillary: 163 mg/dL — ABNORMAL HIGH (ref 70–99)
Glucose-Capillary: 170 mg/dL — ABNORMAL HIGH (ref 70–99)

## 2021-05-18 LAB — BASIC METABOLIC PANEL
Anion gap: 8 (ref 5–15)
BUN: 45 mg/dL — ABNORMAL HIGH (ref 8–23)
CO2: 18 mmol/L — ABNORMAL LOW (ref 22–32)
Calcium: 8.4 mg/dL — ABNORMAL LOW (ref 8.9–10.3)
Chloride: 105 mmol/L (ref 98–111)
Creatinine, Ser: 2.56 mg/dL — ABNORMAL HIGH (ref 0.44–1.00)
GFR, Estimated: 18 mL/min — ABNORMAL LOW (ref >60)
Glucose, Bld: 183 mg/dL — ABNORMAL HIGH (ref 70–99)
Potassium: 5.6 mmol/L — ABNORMAL HIGH (ref 3.5–5.1)
Sodium: 131 mmol/L — ABNORMAL LOW (ref 135–145)

## 2021-05-18 MED ORDER — DILTIAZEM HCL-DEXTROSE 125-5 MG/125ML-% IV SOLN (PREMIX)
5.0000 mg/h | INTRAVENOUS | Status: DC
  Administered 2021-05-18: 5 mg/h via INTRAVENOUS
  Administered 2021-05-18 – 2021-05-19 (×2): 15 mg/h via INTRAVENOUS
  Administered 2021-05-19: 10 mg/h via INTRAVENOUS
  Administered 2021-05-20: 5 mg/h via INTRAVENOUS
  Filled 2021-05-18 (×7): qty 125

## 2021-05-18 MED ORDER — SODIUM CHLORIDE 0.9 % IV BOLUS
250.0000 mL | Freq: Once | INTRAVENOUS | Status: AC
  Administered 2021-05-18: 250 mL via INTRAVENOUS

## 2021-05-18 MED ORDER — FUROSEMIDE 10 MG/ML IJ SOLN
60.0000 mg | Freq: Once | INTRAMUSCULAR | Status: AC
  Administered 2021-05-18: 60 mg via INTRAVENOUS
  Filled 2021-05-18: qty 6

## 2021-05-18 MED ORDER — SODIUM ZIRCONIUM CYCLOSILICATE 10 G PO PACK: 10.0000 g | PACK | ORAL | Status: AC

## 2021-05-18 NOTE — Plan of Care (Signed)
°  Problem: Clinical Measurements: Goal: Will remain free from infection Outcome: Progressing Goal: Diagnostic test results will improve Outcome: Progressing Goal: Respiratory complications will improve Outcome: Progressing   Problem: Coping: Goal: Level of anxiety will decrease Outcome: Progressing   Problem: Elimination: Goal: Will not experience complications related to urinary retention Outcome: Progressing   Problem: Safety: Goal: Ability to remain free from injury will improve Outcome: Progressing

## 2021-05-18 NOTE — Progress Notes (Signed)
HOSPITAL MEDICINE OVERNIGHT EVENT NOTE    Nursing reports that patient is exhibiting a  red MEWS score.    Likely the main contributor is that the patient is developing rapid atrial fibrillation with heart rates frequently going into the 120s  Review of the chart reveals that patient's usual home regimen of Coreg and diltiazem are being held.  This is likely culprit, combined with the fact that nursing states the patient is exhibiting extremely poor urine output with very dark urine suggestive of volume depletion.  Patient reports the patient is quite edematous what sounds to be essentially anasarca.  Patient is on half-normal saline which could potentially be exacerbating this.  Regardless, patient seems to be intravascularly depleted despite third spacing.  We will administer a small bolus of normal saline and if this fails to help improve the heart rate will consider as needed dosing of intravenous metoprolol.  Vernelle [name]  MD [facility]

## 2021-05-18 NOTE — Progress Notes (Signed)
Family meeting with patient's son and daughter.  I explained patient is worsening despite aggressive medical therapy. Multiorgan failure and high risk for continue worsening. All questions were addressed.  Decision was made to change code status to DNR.

## 2021-05-18 NOTE — Progress Notes (Addendum)
PROGRESS NOTE    Sheila Morgan  CBU:[id number] DOB: February 14, 1933 DOA: 05/09/2021 PCP: Binnie Rail, MD    Brief Narrative:  Sheila Morgan was admitted to the hospital with the working diagnosis of cardiogenic shock due to bradycardia, in the setting of adrenal insufficiency.    Sheila Morgan with the past medical history of diastolic heart failure, HTN, T2DM, CAD sp CABG and dementia who presented with lethargy. Recent hospitalization for COVID on 04/05/21 and heart failure 04/18/21. She was transferred from SNF due to lower extremity edema, bradycardia and lethargy. On her initial physical examination her blood pressure was 125/59, HR 53 to 116, RR 26 and oxygen saturation 99%, her lungs were clear to auscultation, heart with S1 and S2 present and irregular, abdomen soft and positive lower extremity edema.  Patient was somnolent but easy to arouse.    Na 135, K 4,1, cl 106, bicarb 19, glucose 106, bun 12 and cr 1,28 Ca 8,3 Wbc 5,8, hgb 8,9, hct 30,4 and plt clumped.  SARS COVID 19 negative   Urine analysis with sg 1,015 negative for leukocytes.    Head CT with no acute changes, positive atrophy    Chest radiograph with bilateral atelectasis at bases.    EKG 49 bpm, with normal axis and normal qtc, atrial fibrillation rhythm, with no significant ST segment or T wave changes.    Patient was placed on furosemide for diuresis plus albumin infusions.    01/08 patient developed severe bradycardia and hypotension, along with worsening mentation.  Patient was placed on IV dobutamine and norepinephrine with good toleration.    01/09 positive melena and acute blood loss anemia, received 1 unit PRBC and GI was consulted.  EGD with mild gastritis with no active bleeding    01/10 wean off vasopressors and 01/11 transfer back to [facility].    Patient with inappropriate low random cortisol, placed on stress dose steroids.  Improving mentation and heart rate.   01/15 atrial fibrillation with RVR and  worsening renal function.    Assessment & Plan:   Principal Problem:   Acute on chronic diastolic CHF (congestive heart failure) (HCC) Active Problems:   Essential hypertension   CORONARY ARTERY BYPASS GRAFT, HX OF   Coronary artery disease   DM (diabetes mellitus) type II, controlled, with peripheral vascular disorder (HCC)   A-fib (HCC)   Hypoalbuminemia   Bradycardia   Hypotension   Malnutrition of moderate degree     Acute on chronic diastolic heart failure, pulmonary hypertension, chronic core pulmonale.  Echocardiogram with LV systolic function preserved with EF 55 to 60%, moderate LVH. RV mild reduction in systolic function. Mild elevated pulmonary systolic pressure. Severe tricuspid regurgitation.    Patient with worsening edema and worsening renal function This am in atrial fibrillation with RVR.  Decreased mentation.  Plan to discontinue IV fluids and add diuresis with furosemide 60 mg IV x1 and follow urine output.    2. Chronic atrial fibrillation with slow ventricular rate/ RVR This am patient with RVR, blood pressure with MAP more than 65  Plan for rate control with IV diltiazem Anticoagulation with apixaban    3. AKI on CKD. Hypomagnesemia, hyponatremia. Non anion gap metabolic acidosis/ hyperkalemia Patient with poor oral intake, her urine output has been decreasing, along with worsening serum cr. Refractive to IV fluids. Worsening renal function with serum cr at 2,56 with K at 5,6 and serum bicarbonate at 18.   Plan to stop IV fluids and add furosemide 60  mg IV Follow renal function and electrolytes. Avoid hypotension and nephrotoxic medications.  Add sodium zirconium for hyperkalemia.    4. Metabolic encephalopathy. Swallow dysfunction. Acute adrenal insufficiency, symptomatic hypothyroid, mixedema.  This am patient with worsening mentation, less reactive, despite IV steroids and IV levothyroxine Continue IV thiamine.  Continue stress dose steroids.   Suspected hypoactive delirium.  Patient with poor prognosis.    5. Acute blood loss anemia, acute gastritis. Iron deficiency  On pantoprazole, hgb now is stable, after  pRBC. Hgb today 10,3 and hct 31,9   Patient on iron supplementation    6. Moderate calorie protein malnutrition/ pressure ulcer sacrum stage 2 present on admission.  Poor oral intake.    7. Gout. No signs of acute flare, continue with allopurinol.    8. T2DM.  Capillary glucose has been 184 this am, patient with poor oral intake, continue with insulin sliding scale for glucose cover and monitoring.    Patient continue to be at high risk for worsening renal function and encephalopathy   Status is: Inpatient  Remains inpatient appropriate because: atrial fibrillation and encephalopathy    DVT prophylaxis:  Apixaban  Code Status:   full  Family Communication:  No family at the bedside.    I spoke over the phone with the patient's daughter about patient's  condition, plan of care, prognosis and all questions were addressed.   I spoke with her daughter and ill plan a family meeting today, patient had a significant decreased in her cognitive function since COVID 19 infection in 12/022  Nutrition Status: Nutrition Problem: Moderate Malnutrition Etiology: chronic illness (dementia, CHF, CKD) Signs/Symptoms: mild fat depletion, moderate muscle depletion Interventions: Ensure Enlive (each supplement provides 350kcal and 20 grams of protein), MVI, Other (Comment), Refer to RD note for recommendations (feeding assistance)     Skin Documentation: Pressure Injury 04/19/21 Sacrum Lower;Posterior Unstageable - Full thickness tissue loss in which the base of the injury is covered by slough (yellow, tan, gray, green or brown) and/or eschar (tan, brown or black) in the wound bed. wound consult placed (Active)  04/19/21 1755  Location: Sacrum  Location Orientation: Lower;Posterior  Staging: Unstageable - Full thickness  tissue loss in which the base of the injury is covered by slough (yellow, tan, gray, green or brown) and/or eschar (tan, brown or black) in the wound bed.  Wound Description (Comments): wound consult placed  Present on Admission: Yes      Subjective: Patient with worsening mentation today, her heart rate is now elevated, continue with poor oral intake.   Objective: Vitals:   05/18/21 0430 05/18/21 0500 05/18/21 0600 05/18/21 0700  BP: (!) 136/92 (!) 153/113 (!) 152/115 (!) 146/103  Pulse: (!) 120 (!) 119 (!) 126 (!) 120  Resp: 16 20 (!) 23 (!) 23  Temp:    98.4 F (36.9 C)  TempSrc:    Axillary  SpO2: 98% 97% 98% 97%  Weight:      Height:        Intake/Output Summary (Last 24 hours) at 05/18/2021 0835 Last data filed at 05/18/2021 0630 Gross per 24 hour  Intake 1921.55 ml  Output 50 ml  Net 1871.55 ml   Filed Weights   05/16/21 0630 05/17/21 0317 05/18/21 0331  Weight: 79.8 kg 81.5 kg 81.9 kg    Examination:   General:  deconditioned and ill looking appearing  Neurology: somnolent and hyporeactive, not following commands, not opening eyes E ENT: positive pallor, no icterus, oral mucosa  Cardiovascular: heart  with S1 and S2 tachycardic, irregularly irregular, no murmurs or gallops. Positive diffuse edema upper and lower extremities pitting +++, legs with compression stockings in place. Pulmonary: rales bilaterally but no wheezing, anterior auscultation  Gastrointestinal. Soft and non tender Skin. No rashes Musculoskeletal: no joint deformities     Data Reviewed: I have personally reviewed following labs and imaging studies  CBC: Recent Labs  Lab 05/11/21 1820 05/11/21 1839 05/12/21 0116 05/12/21 1631 05/13/21 0406 05/14/21 0058  WBC 10.8*  --  8.6 9.4 7.3 5.8  HGB 8.1* 7.8* 7.8* 10.8* 11.7* 10.3*  HCT 26.4* 23.0* 25.5* 32.9* 36.3 31.9*  MCV 96.7  --  96.2 94.3 95.5 93.3  PLT 210  --  179 190 181 373   Basic Metabolic Panel: Recent Labs  Lab  05/12/21 1631 05/13/21 0115 05/14/21 0058 05/15/21 0129 05/16/21 0121 05/17/21 0113  NA 125* 135 134* 134* 132* 134*  K 3.6 4.0 4.1 4.2 4.6 4.9  CL 94* 106 105 105 106 103  CO2 21* 19* 21* 21* 18* 21*  GLUCOSE 489* 94 111* 87 152* 214*  BUN 16 20 25* 26* 26* 35*  CREATININE 1.88* 2.03* 2.21* 2.18* 2.08* 2.22*  CALCIUM 7.7* 8.6* 8.7* 8.4* 8.8* 9.2  MG 1.5* 1.5* 2.8*  --   --   --    GFR: Estimated Creatinine Clearance: 19.6 mL/min (A) (by C-G formula based on SCr of 2.22 mg/dL (H)). Liver Function Tests: No results for input(s): AST, ALT, ALKPHOS, BILITOT, PROT, ALBUMIN in the last 168 hours. No results for input(s): LIPASE, AMYLASE in the last 168 hours. No results for input(s): AMMONIA in the last 168 hours. Coagulation Profile: Recent Labs  Lab 05/12/21 0956  INR 1.9*   Cardiac Enzymes: No results for input(s): CKTOTAL, CKMB, CKMBINDEX, TROPONINI in the last 168 hours. BNP (last 3 results) No results for input(s): PROBNP in the last 8760 hours. HbA1C: No results for input(s): HGBA1C in the last 72 hours. CBG: Recent Labs  Lab 05/17/21 0906 05/17/21 1154 05/17/21 1615 05/17/21 2114 05/18/21 0607  GLUCAP 200* 268* 203* 183* 184*   Lipid Profile: No results for input(s): CHOL, HDL, LDLCALC, TRIG, CHOLHDL, LDLDIRECT in the last 72 hours. Thyroid Function Tests: No results for input(s): TSH, T4TOTAL, FREET4, T3FREE, THYROIDAB in the last 72 hours. Anemia Panel: No results for input(s): VITAMINB12, FOLATE, FERRITIN, TIBC, IRON, RETICCTPCT in the last 72 hours.    Radiology Studies: I have reviewed all of the imaging during this hospital visit personally     Scheduled Meds:  allopurinol  100 mg Oral Q M,W,F   apixaban  2.5 mg Oral BID   calcium-vitamin D  1 tablet Oral BID   Chlorhexidine Gluconate Cloth  6 each Topical Daily   feeding supplement  237 mL Oral TID BM   ferrous gluconate  324 mg Oral Q breakfast   Gerhardt's butt cream   Topical BID    hydrocortisone sod succinate (SOLU-CORTEF) inj  75 mg Intravenous Q12H   insulin aspart  0-15 Units Subcutaneous TID WC   insulin aspart  0-5 Units Subcutaneous QHS   levothyroxine  25 mcg Intravenous Daily   multivitamin with minerals  1 tablet Oral Daily   nystatin  1 application Topical Daily   pantoprazole  40 mg Oral Daily   simvastatin  40 mg Oral QHS   sodium chloride flush  3 mL Intravenous Q12H   Continuous Infusions:  sodium chloride 75 mL/hr at 05/18/21 0043   sodium chloride  thiamine injection 250 mg (05/17/21 2249)     LOS: 8 days        Kaliq Lege Gerome Apley, MD

## 2021-05-18 NOTE — Progress Notes (Signed)
Provider(Shalhoub) notified of Red MEWS score and pt changes in mentation throughout the shift. Also made aware of minimal urine output during shift (100cc). Pt is alert and responsive at this time, denies any pain or discomfort. Denies the urge to void. Bladder scan completed x3 with 0 urine detected. Provider to review chart and f/u

## 2021-05-19 DIAGNOSIS — I4891 Unspecified atrial fibrillation: Secondary | ICD-10-CM

## 2021-05-19 DIAGNOSIS — R638 Other symptoms and signs concerning food and fluid intake: Secondary | ICD-10-CM

## 2021-05-19 LAB — GLUCOSE, CAPILLARY
Glucose-Capillary: 183 mg/dL — ABNORMAL HIGH (ref 70–99)
Glucose-Capillary: 195 mg/dL — ABNORMAL HIGH (ref 70–99)
Glucose-Capillary: 209 mg/dL — ABNORMAL HIGH (ref 70–99)
Glucose-Capillary: 145 mg/dL — ABNORMAL HIGH (ref 70–99)

## 2021-05-19 LAB — BASIC METABOLIC PANEL
Anion gap: 10 (ref 5–15)
BUN: 50 mg/dL — ABNORMAL HIGH (ref 8–23)
CO2: 20 mmol/L — ABNORMAL LOW (ref 22–32)
Calcium: 9.2 mg/dL (ref 8.9–10.3)
Chloride: 103 mmol/L (ref 98–111)
Creatinine, Ser: 2.59 mg/dL — ABNORMAL HIGH (ref 0.44–1.00)
GFR, Estimated: 18 mL/min — ABNORMAL LOW (ref >60)
Glucose, Bld: 194 mg/dL — ABNORMAL HIGH (ref 70–99)
Potassium: 5 mmol/L (ref 3.5–5.1)
Sodium: 133 mmol/L — ABNORMAL LOW (ref 135–145)

## 2021-05-19 MED ORDER — FUROSEMIDE 10 MG/ML IJ SOLN
40.0000 mg | Freq: Two times a day (BID) | INTRAMUSCULAR | Status: AC
  Administered 2021-05-19 (×2): 40 mg via INTRAVENOUS
  Filled 2021-05-19 (×2): qty 4

## 2021-05-19 NOTE — Progress Notes (Signed)
Patient ID: [name], female   DOB: May 28, 1932, 88 y.o.   MRN: [id number]    Progress Note from the Palliative Medicine Team at [facility]   Patient Name: Sheila Morgan        Date: 05/19/2021 DOB: Feb 11, 1933  Age: 88 y.o. MRN#: [id number] Attending Physician: Barton Dubois, MD Primary Care Physician: Binnie Rail, MD Admit Date: 05/09/2021   Medical records reviewed   88 y.o. female  with past medical history of  diastolic heart failure, HTN, T2DM, CAD sp CABG and dementia  admitted on 05/09/2021 with lethargy.   Recent hospitalizations for COVID on 04/05/21 and heart failure 04/18/21.   Hospitalization complicated by severe bradycardia, Hypotension and worsening mentation.  Several days in the ICU on dobutamine and norepinephrine.  Episode of melena and edema, EGD significant for mild gastritis and no active bleeding.  Patient with ongoing lethargy and poor PO intake. PMT consulted to discuss [place].   Of note, in [place] patient had MOST form signed by daughter that was completed recently - 04/24/21. MOST indicated wishes for full code, full scope treatment, and feeding tube for trial.    This NP visited patient at the bedside as a follow up to  [place], and for palliative medicine needs and emotional support.  I met at the bedside with daughter [name]/H POA for continued conversation regarding current medical situation..  Today patient is extremely lethargic, minimally responsive to verbal stimulus and gentle touch and with poor p.o. intake.  We explored patient's baseline/6 months ago and daughter is able to verbalize and noted physical and functional decline over the past many  months.  Education offered on patient's multiple comorbidities and overall failure to thrive.  Education offered on the natural trajectory of multiple comorbidities and underlying dementia as it relates to continued physical, functional and cognitive decline. Education offered on the  limitations of medical interventions to prolong quality of life when the body fails to thrive.  Education offered on the difference between aggressive medical interventions at the palliative comfort path for this patient at this time in this situation  Exploration of patient's values and family's understanding of quality of life.  Daughter is is tearful as she verbalizes an understanding that her mom's time is likely limited and that ultimately she hopes for comfort and dignity for her at this time.  "I do not want her to suffer"  Plan of care -DNR/DNI -Continue current treatment plan until daughter has a chance to speak with her brother before making decisions on next steps in care and transition of care options.  Education offered on hospice benefit; philosophy and eligibility both at home and in facility.  Discussed with daughter the importance of continued conversation with her family and the medical providers regarding overall plan of care and treatment options,  ensuring decisions are within the context of the patients values and GOCs.  Plan is to remeet again tomorrow for further clarification of goals of care.  Questions and concerns addressed   Discussed with Dr Dyann Kief and bedside RN    Wadie Lessen NP  Palliative Medicine Team Team Phone # [phone] Pager [phone]

## 2021-05-19 NOTE — Plan of Care (Signed)
°  Problem: Clinical Measurements: Goal: Ability to maintain clinical measurements within normal limits will improve Outcome: Progressing Goal: Will remain free from infection Outcome: Progressing Goal: Diagnostic test results will improve Outcome: Progressing Goal: Respiratory complications will improve Outcome: Progressing Goal: Cardiovascular complication will be avoided Outcome: Progressing   Problem: Activity: Goal: Risk for activity intolerance will decrease Outcome: Progressing   Problem: Elimination: Goal: Will not experience complications related to urinary retention Outcome: Progressing   Problem: Pain Managment: Goal: General experience of comfort will improve Outcome: Progressing

## 2021-05-19 NOTE — Progress Notes (Signed)
Desats to 80's on room air while sleeping. O2   [place]  2L applied sat- 93% . Continue to monitor.

## 2021-05-19 NOTE — Progress Notes (Signed)
Heart rate  100-90's a-fib. Cardizem gtt down to 12 ml/hr

## 2021-05-19 NOTE — Progress Notes (Signed)
PROGRESS NOTE    Sheila Morgan  PPI:[id number] DOB: [birth year] DOA: 05/09/2021 PCP: Binnie Rail, MD    Brief Narrative:  Sheila Morgan was admitted to the hospital with the working diagnosis of cardiogenic shock due to bradycardia, in the setting of adrenal insufficiency.    88 yo female with the past medical history of diastolic heart failure, HTN, T2DM, CAD sp CABG and dementia who presented with lethargy. Recent hospitalization for COVID on 04/05/21 and heart failure 04/18/21. She was transferred from SNF due to lower extremity edema, bradycardia and lethargy. On her initial physical examination her blood pressure was 125/59, HR 53 to 116, RR 26 and oxygen saturation 99%, her lungs were clear to auscultation, heart with S1 and S2 present and irregular, abdomen soft and positive lower extremity edema.  Patient was somnolent but easy to arouse.    Na 135, K 4,1, cl 106, bicarb 19, glucose 106, bun 12 and cr 1,28 Ca 8,3 Wbc 5,8, hgb 8,9, hct 30,4 and plt clumped.  SARS COVID 19 negative   Urine analysis with sg 1,015 negative for leukocytes.    Head CT with no acute changes, positive atrophy    Chest radiograph with bilateral atelectasis at bases.    EKG 49 bpm, with normal axis and normal qtc, atrial fibrillation rhythm, with no significant ST segment or T wave changes.    Patient was placed on furosemide for diuresis plus albumin infusions.    01/08 patient developed severe bradycardia and hypotension, along with worsening mentation.  Patient was placed on IV dobutamine and norepinephrine with good toleration.    01/09 positive melena and acute blood loss anemia, received 1 unit PRBC and GI was consulted.  EGD with mild gastritis with no active bleeding    01/10 wean off vasopressors and 01/11 transfer back to [facility].    Patient with inappropriate low random cortisol, placed on stress dose steroids.  Improving mentation and heart rate.   01/15 atrial fibrillation with RVR and  worsening renal function.   01/16: Continue to have poor prognosis; intermittently experiencing episodes of A. fib with RVR signs of third space shifting along with anasarca and fine crackles on exam.  Intermittently desaturating and requiring oxygen supplementation.  No eating or drinking much.   Assessment & Plan:   Principal Problem:   Acute on chronic diastolic CHF (congestive heart failure) (HCC) Active Problems:   Essential hypertension   CORONARY ARTERY BYPASS GRAFT, HX OF   Coronary artery disease   DM (diabetes mellitus) type II, controlled, with peripheral vascular disorder (HCC)   A-fib (HCC)   Hypoalbuminemia   Bradycardia   Hypotension   Malnutrition of moderate degree     Acute on chronic diastolic heart failure, pulmonary hypertension, chronic core pulmonale.  -Echocardiogram with LV systolic function preserved with EF 55 to 60%, moderate LVH. RV mild reduction in systolic function. Mild elevated pulmonary systolic pressure. Severe tricuspid regurgitation.  -Patient continues to have difficulty breathing, requiring oxygen supplementation demonstrating positive anasarca -Continue to follow daily weights, strict I's and O's, provide IV diuresis x2 doses. -Patient no eating much. -Overall prognosis continues to be poor -Follow palliative care recommendations.   2. Chronic atrial fibrillation with slow ventricular rate/ RVR -Patient heart rate continue to fluctuate and -Continue the use of Cardizem and metoprolol -Continue to follow stability for adequate transition if possible. -Continue apixaban for secondary prevention.   3. AKI on CKD. Hypomagnesemia, hyponatremia. Non anion gap metabolic acidosis/ hyperkalemia -She continues to  have poor oral intake -Experiencing episodes of desaturation and fine crackles on examination -Continue 2 L nasal cannula supplementation -Lasix 40 mg IV x2 doses will be provided -Continue daily weights and strict I's and O's.    -Potassium now Within normal limits p  4. Metabolic encephalopathy. Swallow dysfunction. Acute adrenal insufficiency, symptomatic hypothyroid, mixedema.  -Mentation continues to be impaired -Having some difficulty following commands, barely eating -Continue treatment with IV steroids, thiamine and IV levothyroxine -Overall poor prognosis -Continue to follow palliative care recommendations after advance care planning discussed. -Patient is DNR currently.    5. Acute blood loss anemia, acute gastritis. Iron deficiency  -Continue PPI -No signs of overt bleeding appreciated -Continue to follow hemoglobin trend -Patient has received 1 unit of PRBCs during this admission. -Continue iron supplementation    6. Moderate calorie protein malnutrition/ pressure ulcer sacrum stage 2 present on admission.  -Continue to have poor oral intake.  -Feeding supplements and oral intake encouraged. -Continue to apply barrier cream and provide constant repositioning.   7. Gout.  -No signs of acute flare, continue with allopurinol.  Especially while actively diuresing patient.   8. T2DM.  -Continue to follow CBGs to further adjust hypoglycemic regimen as needed. -Continue sliding scale insulin -Patient currently in no eating much.     Patient continue to be at high risk for worsening renal function and encephalopathy   Status is: Inpatient  Remains inpatient appropriate because: atrial fibrillation and encephalopathy    DVT prophylaxis:  Apixaban  Code Status:   full  Family Communication:  No family at the bedside; but adequately updated by palliative care service.   Nutrition Status: Nutrition Problem: Moderate Malnutrition Etiology: chronic illness (dementia, CHF, CKD) Signs/Symptoms: mild fat depletion, moderate muscle depletion Interventions: Ensure Enlive (each supplement provides 350kcal and 20 grams of protein), MVI, Other (Comment), Refer to RD note for recommendations (feeding  assistance)     Skin Documentation: Pressure Injury 04/19/21 Sacrum Lower;Posterior Unstageable - Full thickness tissue loss in which the base of the injury is covered by slough (yellow, tan, gray, green or brown) and/or eschar (tan, brown or black) in the wound bed. wound consult placed (Active)  04/19/21 1755  Location: Sacrum  Location Orientation: Lower;Posterior  Staging: Unstageable - Full thickness tissue loss in which the base of the injury is covered by slough (yellow, tan, gray, green or brown) and/or eschar (tan, brown or black) in the wound bed.  Wound Description (Comments): wound consult placed  Present on Admission: Yes    Subjective: Continues to have poor oral intake, severely weak and deconditioned.  Requiring oxygen supplementation and is still having intermittent episodes of atrial fibrillation.  Objective: Vitals:   05/19/21 1200 05/19/21 1600 05/19/21 1608 05/19/21 1700  BP:    (!) 131/97  Pulse:   76 (!) 195  Resp:    19  Temp:    98.3 F (36.8 C)  TempSrc:    Oral  SpO2: 93% 95% 92% 92%  Weight:      Height:        Intake/Output Summary (Last 24 hours) at 05/19/2021 1925 Last data filed at 05/19/2021 1900 Gross per 24 hour  Intake 200 ml  Output 350 ml  Net -150 ml   Filed Weights   05/17/21 0317 05/18/21 0331 05/19/21 0300  Weight: 81.5 kg 81.9 kg 81.9 kg    Examination:  General exam: Chronically ill in appearance, deconditioned, experiencing desaturation while sleeping, possibly shallow breath and decreased oral intake. Respiratory  system: Positive rhonchi right, decreased breath sounds at the bases and appreciated fine crackles. Cardiovascular system: No rubs, no gallops, irregular heart rate. Gastrointestinal system: Abdomen is nondistended, soft and nontender. No organomegaly or masses felt. Normal bowel sounds heard. Central nervous system: Able to move all 4 limbs spontaneously.  No focal neurological deficits. Extremities: No cyanosis or  clubbing; 2+ edema appreciated bilaterally. Skin: No petechiae.  Stage II pressure injury in her sacrum present at time of admission; no signs of superimposed infection. Psychiatry: Mood & affect appropriate.    Data Reviewed: I have personally reviewed following labs and imaging studies  CBC: Recent Labs  Lab 05/13/21 0406 05/14/21 0058  WBC 7.3 5.8  HGB 11.7* 10.3*  HCT 36.3 31.9*  MCV 95.5 93.3  PLT 181 010   Basic Metabolic Panel: Recent Labs  Lab 05/13/21 0115 05/14/21 0058 05/15/21 0129 05/16/21 0121 05/17/21 0113 05/18/21 0945 05/19/21 0126  NA 135 134* 134* 132* 134* 131* 133*  K 4.0 4.1 4.2 4.6 4.9 5.6* 5.0  CL 106 105 105 106 103 105 103  CO2 19* 21* 21* 18* 21* 18* 20*  GLUCOSE 94 111* 87 152* 214* 183* 194*  BUN 20 25* 26* 26* 35* 45* 50*  CREATININE 2.03* 2.21* 2.18* 2.08* 2.22* 2.56* 2.59*  CALCIUM 8.6* 8.7* 8.4* 8.8* 9.2 8.4* 9.2  MG 1.5* 2.8*  --   --   --   --   --    GFR: Estimated Creatinine Clearance: 16.8 mL/min (A) (by C-G formula based on SCr of 2.59 mg/dL (H)).   CBG: Recent Labs  Lab 05/18/21 1536 05/18/21 2102 05/19/21 0611 05/19/21 1148 05/19/21 1610  GLUCAP 163* 170* 183* 195* 209*   Radiology Studies: I have reviewed all of the imaging during this hospital visit personally  Scheduled Meds:  allopurinol  100 mg Oral Q M,W,F   apixaban  2.5 mg Oral BID   calcium-vitamin D  1 tablet Oral BID   Chlorhexidine Gluconate Cloth  6 each Topical Daily   feeding supplement  237 mL Oral TID BM   ferrous gluconate  324 mg Oral Q breakfast   furosemide  40 mg Intravenous Q12H   Gerhardt's butt cream   Topical BID   hydrocortisone sod succinate (SOLU-CORTEF) inj  75 mg Intravenous Q12H   insulin aspart  0-15 Units Subcutaneous TID WC   insulin aspart  0-5 Units Subcutaneous QHS   levothyroxine  25 mcg Intravenous Daily   multivitamin with minerals  1 tablet Oral Daily   nystatin  1 application Topical Daily   pantoprazole  40 mg Oral  Daily   simvastatin  40 mg Oral QHS   sodium chloride flush  3 mL Intravenous Q12H   Continuous Infusions:  sodium chloride     diltiazem (CARDIZEM) infusion 5 mg/hr (05/19/21 1900)   thiamine injection 250 mg (05/19/21 1513)     LOS: 9 days    Barton Dubois, MD

## 2021-05-20 LAB — BASIC METABOLIC PANEL
Anion gap: 11 (ref 5–15)
BUN: 55 mg/dL — ABNORMAL HIGH (ref 8–23)
CO2: 21 mmol/L — ABNORMAL LOW (ref 22–32)
Calcium: 9.4 mg/dL (ref 8.9–10.3)
Chloride: 105 mmol/L (ref 98–111)
Creatinine, Ser: 2.61 mg/dL — ABNORMAL HIGH (ref 0.44–1.00)
GFR, Estimated: 17 mL/min — ABNORMAL LOW (ref >60)
Glucose, Bld: 164 mg/dL — ABNORMAL HIGH (ref 70–99)
Potassium: 5.3 mmol/L — ABNORMAL HIGH (ref 3.5–5.1)
Sodium: 137 mmol/L (ref 135–145)

## 2021-05-20 LAB — GLUCOSE, CAPILLARY: Glucose-Capillary: 168 mg/dL — ABNORMAL HIGH (ref 70–99)

## 2021-05-20 MED ORDER — HYDROCORTISONE SOD SUC (PF) 100 MG IJ SOLR: 50.0000 mg | Freq: Every day | INTRAMUSCULAR | Status: DC

## 2021-05-20 MED ORDER — FUROSEMIDE 10 MG/ML IJ SOLN
60.0000 mg | Freq: Two times a day (BID) | INTRAMUSCULAR | Status: DC
  Administered 2021-05-20 – 2021-05-21 (×3): 60 mg via INTRAVENOUS
  Filled 2021-05-20 (×3): qty 6

## 2021-05-20 MED ORDER — INSULIN ASPART 100 UNIT/ML IJ SOLN: 0.0000 [IU] | Freq: Three times a day (TID) | INTRAMUSCULAR | Status: DC

## 2021-05-20 MED ORDER — THIAMINE HCL 100 MG/ML IJ SOLN
100.0000 mg | Freq: Every day | INTRAMUSCULAR | Status: DC
  Administered 2021-05-20: 100 mg via INTRAVENOUS
  Filled 2021-05-20: qty 2

## 2021-05-20 NOTE — Progress Notes (Signed)
I spoke with patient's son at the bedside and her daughter over the phone, we talked about patient not improving despite aggressive medical care. She has poor prognosis. Her living will mentions not to perform live prolonging interventions in the setting of poor prognosis.   Plan is to stop all blood sampling and capillary glucose monitoring. Transition to full comfort care in am.

## 2021-05-20 NOTE — Progress Notes (Addendum)
Physical Therapy Treatment Patient Details Name: Sheila Morgan MRN: [id number] DOB: 30-Jan-1933 Today's Date: 05/20/2021   History of Present Illness Pt is an 88 y.o. female admitted from SNF on 05/09/21 with acute on chronic CHF. Pt developed bradycardia and HTN on 1/8. Pt with decline in Hgb and concern for upper GI bleed. EGD 1/9 showed gatritis but no active bleeding. Course complicated by afib with RVR, worsening renal function, encephalopathy. Code change to DNR on 1/15 due to multiorgan failure, poor prognosis. PMH includes COVID (admission 04/2021 for this with d/c to SNF rehab), CAD s/p CABG, DM, afib, hypothyroidism, DVT, CKD.   PT Comments    Pt not progressing with mobility, currently requiring totalA for bed-level mobility; only actively moving RUE to minimally participate in ADL task; pt answering some yes/no questions with verbalizations and head nods. Significant BUE weeping requiring frequent pad replacements for moisture absorption; declines bites of food when offered. Per chart, likely plan for transition to comfort care tomorrow. Will follow-up if pt appropriate to stay on acute PT caseload.  HR 98-125 with bed-level activity    Recommendations for follow up therapy are one component of a multi-disciplinary discharge planning process, led by the attending physician.  Recommendations may be updated based on patient status, additional functional criteria and insurance authorization.  Follow Up Recommendations  Long-term institutional care without follow-up therapy     Assistance Recommended at Discharge Intermittent Supervision/Assistance  Patient can return home with the following Two people to help with walking and/or transfers;A lot of help with bathing/dressing/bathroom;Assistance with feeding   Equipment Recommendations  Wheelchair (measurements PT);Wheelchair cushion (measurements PT);Hospital bed;Mechanical lift   Recommendations for Other Services        Precautions / Restrictions Precautions Precautions: Fall;Other (comment) Precaution Comments: watch HR; BUEs weeping Restrictions Weight Bearing Restrictions: No     Mobility  Bed Mobility Overal bed mobility: Needs Assistance   Rolling: Total assist         General bed mobility comments: totalA for repositioning trunk and extremeties in bed; increased HOB elevation for offering bites of food    Transfers                        Ambulation/Gait                   Stairs             Wheelchair Mobility    Modified Rankin (Stroke Patients Only)       Balance                                            Cognition Arousal/Alertness: Lethargic, Awake/alert Behavior During Therapy: Flat affect Overall Cognitive Status: History of cognitive impairments - at baseline                                 General Comments: H/o dementia at baseline. Pt with very minimal responses, at times saying yes/no or humming in response; decreased simple command following        Exercises Other Exercises Other Exercises: BUE PROM elbow flex, shoulder flex <90' - pt actively moving RUE to wash face with minA, minimal movement of LUE Other Exercises: No active movement of BLE, tolerating some hip/knee PROM    General Comments  General comments (skin integrity, edema, etc.): HR 98-125 with bed-level activity, SpO2 stable on 1-2L O2 [place]; BP 128/110 (unsure reliable reading from L calf). Pt declining bites of ice cream when offered. Significant BUE weeping, pads for arms changed; pt not soiled otherwise      Pertinent Vitals/Pain Pain Assessment Pain Assessment: PAINAD Breathing: normal Negative Vocalization: occasional moan/groan, low speech, negative/disapproving quality Facial Expression: smiling or inexpressive Body Language: relaxed Consolability: no need to console PAINAD Score: 1 Pain Intervention(s): Monitored during  session    Home Living                          Prior Function            PT Goals (current goals can now be found in the care plan section) Progress towards PT goals: Not progressing toward goals - comment (lethargy, AMS)    Frequency    Min 2X/week      PT Plan Discharge plan needs to be updated    Co-evaluation              AM-PAC PT "6 Clicks" Mobility   Outcome Measure  Help needed turning from your back to your side while in a flat bed without using bedrails?: Total Help needed moving from lying on your back to sitting on the side of a flat bed without using bedrails?: Total Help needed moving to and from a bed to a chair (including a wheelchair)?: Total Help needed standing up from a chair using your arms (e.g., wheelchair or bedside chair)?: Total Help needed to walk in hospital room?: Total Help needed climbing 3-5 steps with a railing? : Total 6 Click Score: 6    End of Session   Activity Tolerance: Patient limited by fatigue;Patient limited by lethargy Patient left: in bed;with call bell/phone within reach;with bed alarm set Nurse Communication: Mobility status PT Visit Diagnosis: Muscle weakness (generalized) (M62.81);Difficulty in walking, not elsewhere classified (R26.2)     Time: 3382-5053 PT Time Calculation (min) (ACUTE ONLY): 18 min  Charges:  $Therapeutic Activity: 8-22 mins                     Mabeline Caras, PT, DPT Acute Rehabilitation Services  Pager [phone] Office [phone]  Derry Lory 05/20/2021, 2:51 PM

## 2021-05-20 NOTE — Progress Notes (Addendum)
PROGRESS NOTE    Sheila Morgan  YHC:[id number] DOB: [birth year] DOA: 05/09/2021 PCP: Binnie Rail, MD    Brief Narrative:  Sheila Morgan was admitted to the hospital with the working diagnosis of cardiogenic shock due to bradycardia, in the setting of adrenal insufficiency.  Now with multiorgan failure and poor prognosis.    88 yo female with the past medical history of diastolic heart failure, HTN, T2DM, CAD sp CABG and mild dementia who presented with lethargy. Recent hospitalization for COVID on 04/05/21 and heart failure 04/18/21. She was transferred from SNF due to lower extremity edema, bradycardia and lethargy. On her initial physical examination her blood pressure was 125/59, HR 53 to 116, RR 26 and oxygen saturation 99%, her lungs were clear to auscultation, heart with S1 and S2 present and irregular, abdomen soft and positive lower extremity edema.  Patient was somnolent but easy to arouse.    Na 135, K 4,1, cl 106, bicarb 19, glucose 106, bun 12 and cr 1,28 Ca 8,3 Wbc 5,8, hgb 8,9, hct 30,4 and plt clumped.  SARS COVID 19 negative   Urine analysis with sg 1,015 negative for leukocytes.    Head CT with no acute changes, positive atrophy    Chest radiograph with bilateral atelectasis at bases.    EKG 49 bpm, with normal axis and normal qtc, atrial fibrillation rhythm, with no significant ST segment or T wave changes.    Patient was placed on furosemide for diuresis plus albumin infusions.    01/08 patient developed severe bradycardia and hypotension, along with worsening mentation.  Patient was placed on IV dobutamine and norepinephrine with good toleration.    01/09 positive melena and acute blood loss anemia, received 1 unit PRBC and GI was consulted.  EGD with mild gastritis with no active bleeding    01/10 wean off vasopressors and 01/11 transfer back to [facility].    Patient with inappropriate low random cortisol, placed on stress dose steroids.  Improving mentation and  heart rate.    01/15 atrial fibrillation with RVR and worsening renal function.  01/15 code change to DNR, patient with multiorgan failure and poor prognosis.   01/17 worsening encephalopathy, patient with poor oral intake.   Assessment & Plan:   Principal Problem:   Acute on chronic diastolic CHF (congestive heart failure) (HCC) Active Problems:   Essential hypertension   CORONARY ARTERY BYPASS GRAFT, HX OF   Coronary artery disease   DM (diabetes mellitus) type II, controlled, with peripheral vascular disorder (HCC)   A-fib (HCC)   Hypoalbuminemia   Bradycardia   Hypotension   Malnutrition of moderate degree     Acute on chronic diastolic heart failure, pulmonary hypertension, chronic core pulmonale.  Echocardiogram with LV systolic function preserved with EF 55 to 60%, moderate LVH. RV mild reduction in systolic function. Mild elevated pulmonary systolic pressure. Severe tricuspid regurgitation.    Patient has been placed on diuresis, but continue with volume overload, urine output over last 24 hrs is 550 ml.  Systolic blood pressure is 139 mmHg.   Plan to increase diuresis to 60 mg IV q12 hrs to target a negative fluid balance.  Continue heart rate control with diltiazem infusion.    2. Chronic atrial fibrillation with slow ventricular rate/ RVR Patient with metabolic encephalopathy and not constant po intake, not able to transition to oral AV blockade.  Plan to continue with IV diltiazem for rate control and continue anticoagulation with apixaban.    3. AKI on  CKD. Hypomagnesemia, hyponatremia. Non anion gap metabolic acidosis/ hyperkalemia Persistent hypervolemia, her renal function today with serum cr at 2,61 , with K at 5,3 and serum bicarbonate at 21.    Continue aggressive diuresis with IV furosemide, increase dose to 60 mg IV q12 hrs Follow up renal function in am, avoid hypotension and nephrotoxic medications.  Patient with poor oral intake, and poor prognosis.      4. Metabolic encephalopathy. Swallow dysfunction. Acute adrenal insufficiency, symptomatic hypothyroid, mixedema. Hypoactive delirium Patient with worsening encephalopathy, poor oral intake and less responsive.  Suspected hypoactive delirium. Poor prognosis.  Will decrease dose steroids to 50 gm Iv q25, and continue with IV levothyroxine Continue with IV thiamine and continue to encourage po intake. Patient not candidate to tube feedings due to encephalopathy and risk of aspiration. Patient on a dysphagia 3 diet.  Poor prognosis, continue to follow up with palliative care recommendations.    5. Acute blood loss anemia, acute gastritis. Iron deficiency  On pantoprazole, hgb now is stable, after  pRBC. Hgb today 10,3 and hct 31,9   On iron supplementation    6. Moderate calorie protein malnutrition/ pressure ulcer sacrum stage 2 present on admission.  Patient with significant edema, continue sacrum pressure wound care.    7. Gout. No clinical signs of acute flare, continue with allopurinol.    8. T2DM steroid induced hyperglycemia.   Poor oral intake, fasting glucose this am 164, will continue with insulin sliding scale for glucose cover and monitoring.     Patient continue to be at high risk for worsening multiorgan failure   Status is: Inpatient  Remains inpatient appropriate because: IV medications        DVT prophylaxis:  Apixaban   Code Status:    DNR   Family Communication:  No family at the bedside.      Nutrition Status: Nutrition Problem: Moderate Malnutrition Etiology: chronic illness (dementia, CHF, CKD) Signs/Symptoms: mild fat depletion, moderate muscle depletion Interventions: Ensure Enlive (each supplement provides 350kcal and 20 grams of protein), MVI, Other (Comment), Refer to RD note for recommendations (feeding assistance)     Skin Documentation: Pressure Injury 04/19/21 Sacrum Lower;Posterior Unstageable - Full thickness tissue loss in which  the base of the injury is covered by slough (yellow, tan, gray, green or brown) and/or eschar (tan, brown or black) in the wound bed. wound consult placed (Active)  04/19/21 1755  Location: Sacrum  Location Orientation: Lower;Posterior  Staging: Unstageable - Full thickness tissue loss in which the base of the injury is covered by slough (yellow, tan, gray, green or brown) and/or eschar (tan, brown or black) in the wound bed.  Wound Description (Comments): wound consult placed  Present on Admission: Yes     Consultants:  Palliative care    Subjective: Patient is poorly reactive, very deconditioned, poor oral intake.   Objective: Vitals:   05/19/21 1945 05/19/21 2257 05/20/21 0259 05/20/21 0749  BP: (!) 135/95 127/82 (!) 139/98   Pulse: 89 (!) 110 (!) 114   Resp: 20 19 16    Temp: 97.8 F (36.6 C) 97.6 F (36.4 C) 97.6 F (36.4 C) (!) 97.4 F (36.3 C)  TempSrc: Axillary Axillary Axillary Axillary  SpO2: 93% 94% 96%   Weight:   81.7 kg   Height:        Intake/Output Summary (Last 24 hours) at 05/20/2021 0828 Last data filed at 05/20/2021 0447 Gross per 24 hour  Intake 200 ml  Output 550 ml  Net -350  ml   Filed Weights   05/18/21 0331 05/19/21 0300 05/20/21 0259  Weight: 81.9 kg 81.9 kg 81.7 kg    Examination:   General:  deconditioned and ill looking appearing  Neurology: not following commands, not opens eyes, poorly responsive, E ENT: positive pallor, no icterus, oral mucosa  Cardiovascular: heart with S1 and S2 present with no gallops or murmurs, no rubs, irregularly irregular  Pulmonary: distant breath sounds with no wheezing, bilateral rales but not rhonchi, anterior auscultation  Gastrointestinal. Protuberant, not distended, not tender to superficial palpation.  Skin. No rashes Musculoskeletal: no joint deformities     Data Reviewed: I have personally reviewed following labs and imaging studies  CBC: Recent Labs  Lab 05/14/21 0058  WBC 5.8  HGB  10.3*  HCT 31.9*  MCV 93.3  PLT 599   Basic Metabolic Panel: Recent Labs  Lab 05/14/21 0058 05/15/21 0129 05/16/21 0121 05/17/21 0113 05/18/21 0945 05/19/21 0126 05/20/21 0052  NA 134*   < > 132* 134* 131* 133* 137  K 4.1   < > 4.6 4.9 5.6* 5.0 5.3*  CL 105   < > 106 103 105 103 105  CO2 21*   < > 18* 21* 18* 20* 21*  GLUCOSE 111*   < > 152* 214* 183* 194* 164*  BUN 25*   < > 26* 35* 45* 50* 55*  CREATININE 2.21*   < > 2.08* 2.22* 2.56* 2.59* 2.61*  CALCIUM 8.7*   < > 8.8* 9.2 8.4* 9.2 9.4  MG 2.8*  --   --   --   --   --   --    < > = values in this interval not displayed.   GFR: Estimated Creatinine Clearance: 16.7 mL/min (A) (by C-G formula based on SCr of 2.61 mg/dL (H)). Liver Function Tests: No results for input(s): AST, ALT, ALKPHOS, BILITOT, PROT, ALBUMIN in the last 168 hours. No results for input(s): LIPASE, AMYLASE in the last 168 hours. No results for input(s): AMMONIA in the last 168 hours. Coagulation Profile: No results for input(s): INR, PROTIME in the last 168 hours. Cardiac Enzymes: No results for input(s): CKTOTAL, CKMB, CKMBINDEX, TROPONINI in the last 168 hours. BNP (last 3 results) No results for input(s): PROBNP in the last 8760 hours. HbA1C: No results for input(s): HGBA1C in the last 72 hours. CBG: Recent Labs  Lab 05/19/21 0611 05/19/21 1148 05/19/21 1610 05/19/21 2101 05/20/21 0602  GLUCAP 183* 195* 209* 145* 168*   Lipid Profile: No results for input(s): CHOL, HDL, LDLCALC, TRIG, CHOLHDL, LDLDIRECT in the last 72 hours. Thyroid Function Tests: No results for input(s): TSH, T4TOTAL, FREET4, T3FREE, THYROIDAB in the last 72 hours. Anemia Panel: No results for input(s): VITAMINB12, FOLATE, FERRITIN, TIBC, IRON, RETICCTPCT in the last 72 hours.    Radiology Studies: I have reviewed all of the imaging during this hospital visit personally     Scheduled Meds:  allopurinol  100 mg Oral Q M,W,F   apixaban  2.5 mg Oral BID    calcium-vitamin D  1 tablet Oral BID   Chlorhexidine Gluconate Cloth  6 each Topical Daily   feeding supplement  237 mL Oral TID BM   ferrous gluconate  324 mg Oral Q breakfast   Gerhardt's butt cream   Topical BID   hydrocortisone sod succinate (SOLU-CORTEF) inj  75 mg Intravenous Q12H   insulin aspart  0-15 Units Subcutaneous TID WC   insulin aspart  0-5 Units Subcutaneous QHS   levothyroxine  25 mcg Intravenous Daily   multivitamin with minerals  1 tablet Oral Daily   nystatin  1 application Topical Daily   pantoprazole  40 mg Oral Daily   simvastatin  40 mg Oral QHS   sodium chloride flush  3 mL Intravenous Q12H   Continuous Infusions:  sodium chloride     diltiazem (CARDIZEM) infusion 5 mg/hr (05/19/21 1900)     LOS: 10 days        Sunni Richardson Gerome Apley, MD

## 2021-05-20 NOTE — Plan of Care (Signed)
  Problem: Clinical Measurements: Goal: Ability to maintain clinical measurements within normal limits will improve Outcome: Progressing Goal: Respiratory complications will improve Outcome: Progressing   

## 2021-05-21 DIAGNOSIS — F03918 Unspecified dementia, unspecified severity, with other behavioral disturbance: Secondary | ICD-10-CM

## 2021-05-21 DIAGNOSIS — L89150 Pressure ulcer of sacral region, unstageable: Secondary | ICD-10-CM | POA: Diagnosis present

## 2021-05-21 DIAGNOSIS — R0609 Other forms of dyspnea: Secondary | ICD-10-CM

## 2021-05-21 MED ORDER — MORPHINE SULFATE (PF) 2 MG/ML IV SOLN
1.0000 mg | INTRAVENOUS | Status: DC | PRN
  Administered 2021-05-21 – 2021-05-24 (×12): 1 mg via INTRAVENOUS
  Filled 2021-05-21 (×12): qty 1

## 2021-05-21 MED ORDER — LORAZEPAM 2 MG/ML IJ SOLN
0.5000 mg | INTRAMUSCULAR | Status: DC | PRN
  Administered 2021-05-22 – 2021-05-24 (×6): 0.5 mg via INTRAVENOUS
  Filled 2021-05-21 (×6): qty 1

## 2021-05-21 NOTE — Progress Notes (Signed)
Nutrition Brief Note  Chart reviewed. Per MD note on 05/20/21, pt transitioning to full comfort care this morning. No further nutrition interventions planned at this time.  Please re-consult as needed.    Gustavus Bryant, MS, RD, LDN Inpatient Clinical Dietitian Please see AMiON for contact information.

## 2021-05-21 NOTE — Hospital Course (Signed)
88 year old female past medical history diastolic heart failure, type 2 diabetes mellitus, CAD status post CABG and mild dementia with recent hospitalization for COVID in early December followed by heart failure in mid December sent over from her skilled nursing facility on 05/09/2021 for bradycardia, hypotension and worsening mentation.  After several days, patient began to worsen requiring pressor support was able to be weaned off the following day.  Hospital course noteworthy for some acute blood loss anemia and transfusion of 1 unit packed red blood cells on 1/9.  EGD revealed mild gastritis with no active bleeding.  Patient continued to decline on 01/15 went into atrial fibrillation with rapid ventricular rate and patient's mentation worsened.  After discussion with attending physician as well as palliative care, patient was transitioned to full comfort care formally on 1/18.

## 2021-05-21 NOTE — Assessment & Plan Note (Signed)
Status post CABG.

## 2021-05-21 NOTE — Assessment & Plan Note (Signed)
Despite diuresis, patient has declined.  Now comfort care

## 2021-05-21 NOTE — Assessment & Plan Note (Signed)
Initially hypotensive requiring pressor support.  Pressors weaned off.  Now comfort care.  All medications stopped.

## 2021-05-21 NOTE — Assessment & Plan Note (Signed)
Nutrition Status: Nutrition Problem: Moderate Malnutrition Etiology: chronic illness (dementia, CHF, CKD) Signs/Symptoms: mild fat depletion, moderate muscle depletion Interventions: Ensure Enlive (each supplement provides 350kcal and 20 grams of protein), MVI, Other (Comment), Refer to RD note for recommendations (feeding assistance)   Now comfort care

## 2021-05-21 NOTE — Progress Notes (Signed)
[facility] Progress Note  Patient: Sheila Morgan    TKZ:[id number]  DOA: 05/09/2021    Date of Service: the patient was seen and examined on 05/21/2021  Brief hospital course: 88 year old female past medical history diastolic heart failure, type 2 diabetes mellitus, CAD status post CABG and mild dementia with recent hospitalization for COVID in early December followed by heart failure in mid December sent over from her skilled nursing facility on 05/09/2021 for bradycardia, hypotension and worsening mentation.  After several days, patient began to worsen requiring pressor support was able to be weaned off the following day.  Hospital course noteworthy for some acute blood loss anemia and transfusion of 1 unit packed red blood cells on 1/9.  EGD revealed mild gastritis with no active bleeding.  Patient continued to decline on 01/15 went into atrial fibrillation with rapid ventricular rate and patient's mentation worsened.  After discussion with attending physician as well as palliative care, patient was transitioned to full comfort care formally on 1/18.  Assessment and Plan: Cardiovascular and Mediastinum A-fib [facility]) Assessment & Plan Requiring Cardizem drip.  Now that she is comfort care, Cardizem discontinued  DM (diabetes mellitus) type II, controlled, with peripheral vascular disorder [facility]) Assessment & Plan Patient now comfort care.  Sliding scale and CBG checks discontinued  Coronary artery disease Assessment & Plan Status post CABG.  Essential hypertension Assessment & Plan Initially hypotensive requiring pressor support.  Pressors weaned off.  Now comfort care.  All medications stopped.  * Acute on chronic diastolic CHF (congestive heart failure) ([place]) Assessment & Plan Despite diuresis, patient has declined.  Now comfort care  Other Malnutrition of moderate degree Assessment & Plan Nutrition Status: Nutrition Problem: Moderate Malnutrition Etiology: chronic illness  (dementia, CHF, CKD) Signs/Symptoms: mild fat depletion, moderate muscle depletion Interventions: Ensure Enlive (each supplement provides 350kcal and 20 grams of protein), MVI, Other (Comment), Refer to RD note for recommendations (feeding assistance)   Now comfort care    Body mass index is 29.07 kg/m.  Nutrition Problem: Moderate Malnutrition Etiology: chronic illness (dementia, CHF, CKD) Pressure Injury 04/19/21 Sacrum Lower;Posterior Unstageable - Full thickness tissue loss in which the base of the injury is covered by slough (yellow, tan, gray, green or brown) and/or eschar (tan, brown or black) in the wound bed. wound consult placed (Active)  04/19/21 1755  Location: Sacrum  Location Orientation: Lower;Posterior  Staging: Unstageable - Full thickness tissue loss in which the base of the injury is covered by slough (yellow, tan, gray, green or brown) and/or eschar (tan, brown or black) in the wound bed.  Wound Description (Comments): wound consult placed  Present on Admission: Yes    Unstageable sacral ulcer: Present on admission  Senile dementia with behavioral disturbance  Consultants: Cardiology Critical care Gastroenterology Palliative care  Procedures: Infusion of pressors EGD Transfusion packed red blood cells  Antimicrobials: None  Code Status: Comfort care   Subjective: Patient moans, does not awaken  Objective: Vitals reviewed, rapid A. fib Vitals:   05/21/21 1205 05/21/21 1500  BP: (!) 131/101   Pulse: (!) 131 (!) 140  Resp: 19 19  Temp: 98.1 F (36.7 C)   SpO2: 98% 99%    Intake/Output Summary (Last 24 hours) at 05/21/2021 1711 Last data filed at 05/20/2021 1800 Gross per 24 hour  Intake --  Output 500 ml  Net -500 ml   Filed Weights   05/18/21 0331 05/19/21 0300 05/20/21 0259  Weight: 81.9 kg 81.9 kg 81.7 kg   Body  mass index is 29.07 kg/m.  Exam:  General: Patient occasionally moans, does not follow commands Cardiovascular:  Irregular rhythm, tachycardic Respiratory: Scattered rhonchi  Data Reviewed: All labs discontinued now that she is comfort care  Disposition:  Status is: Inpatient  Remains inpatient appropriate because: Patient is actively dying   Family Communication: Daughter at the bedside DVT Prophylaxis:   None due to comfort care    Author: Annita Brod ,MD 05/21/2021 5:11 PM  To reach On-call, see care teams to locate the attending and reach out via [email or web address]. Between 7PM-7AM, please contact night-coverage If you still have difficulty reaching the attending provider, please page the [facility] (Director on Call) for [facility] on amion for assistance.

## 2021-05-21 NOTE — Assessment & Plan Note (Signed)
Requiring Cardizem drip.  Now that she is comfort care, Cardizem discontinued

## 2021-05-21 NOTE — Assessment & Plan Note (Signed)
Patient now comfort care.  Sliding scale and CBG checks discontinued

## 2021-05-21 NOTE — Progress Notes (Signed)
2 Rns tried several attempts to insert foley cath but were unsuccessful due to pt being edematous and pushing against catheter and Rns. When pt said "no," Rns stopped attempt and placed purewick again. PT resting comfortably.

## 2021-05-21 NOTE — Progress Notes (Signed)
OT Contact Note  Patient Details Name: Sheila Morgan MRN: [id number] DOB: July 14, 1932   Contact note:   Patient has transitioned to comfort care. Will sign off at this time.   Corinne Ports E. Riki Berninger, OTR/L Acute Rehabilitation Services [phone] [place] 05/21/2021, 10:56 AM

## 2021-05-21 NOTE — Progress Notes (Signed)
Patient ID: Sheila Morgan, female   DOB: 07-Sep-1932, 88 y.o.   MRN: [id number]    Progress Note from the Palliative Medicine Team at [facility]   Patient Name: Sheila Morgan        Date: 05/21/2021 DOB: 12/23/1932  Age: 88 y.o. MRN#: [id number] Attending Physician: Annita Brod, MD Primary Care Physician: Binnie Rail, MD Admit Date: 05/09/2021   Medical records reviewed   88 y.o. female  with past medical history of 88  diastolic heart failure, HTN, T2DM, CAD sp CABG and dementia  admitted on 05/09/2021 with lethargy.   Recent hospitalizations for COVID on 04/05/21 and heart failure 04/18/21.   Hospitalization complicated by severe bradycardia, Hypotension and worsening mentation.  Several days in the ICU on dobutamine and norepinephrine.  Episode of melena and edema, EGD significant for mild gastritis and no active bleeding.  Patient with ongoing lethargy and poor PO intake. PMT consulted to discuss [place].   Of note, patient has documented healthcare power of attorney, and documentation of desire for natural death.  Hard copies on chart.   This NP visited patient at the bedside as a follow up to  [place], and for palliative medicine needs and emotional support and to meet with daughter Sheila Morgan as scheduled.   Sheila Morgan is accompanied by her cousin Sheila Morgan today.  Patient does not have medical decision-making capacity at this time.  Continued conversation regarding current medical situation..  Today patient is extremely lethargic and a bit more alert.  She continues to have poor p.o. intake, she is  dyspneic.    This is a difficult time for family as they experience anticipatory grief and prepare for the loss of their mother.  Education offered on the natural trajectory and expectations at end-of-life and that likely [name] prognosis is days to weeks with or without current treatments.  Although it is difficult Sheila Morgan is able to verbalize an understanding of her mother's  limited prognosis and her ultimate hope is for comfort and dignity for her mother at end-of-life.  Education offered on the difference between aggressive medical interventions at the palliative comfort path for this patient at this time in this situation  Plan of care -DNR/DNI--- allow for natural death -No artificial feeding or hydration now or in the future -Minimize medications to those that promote comfort -Reevaluate in the morning for viable transition of care options.   Education offered on hospice benefit; philosophy and eligibility both at home and in facility.  Questions and concerns addressed   Discussed with Dr Maryland Pink and bedside RN    Wadie Lessen NP  Palliative Medicine Team Team Phone # [phone] Pager [phone]

## 2021-05-22 NOTE — Progress Notes (Signed)
Heart Failure Navigator Progress Note  Assessed for Heart & Vascular TOC clinic readiness.  Patient does not meet criteria due to transitioned to comfort care.   HF Navigation team will sign-off.   Pricilla Holm, MSN, RN Heart Failure Nurse Navigator [phone]

## 2021-05-22 NOTE — TOC Progression Note (Signed)
Transition of Care [facility]) - Progression Note    Patient Details  Name: JOZEY JANCO MRN: [id number] Date of Birth: 08/27/1932  Transition of Care [facility]) CM/SW [place], RN Phone [phone]  05/22/2021, 3:17 PM  Clinical Narrative:    TOC continues to follow for disposition needs.    Expected Discharge Plan: [place] Barriers to Discharge: Continued Medical Work up  Expected Discharge Plan and Services Expected Discharge Plan: [place] In-house Referral: Clinical Social Work     Living arrangements for the past 2 months: [place] (from [facility] short term)                                       Social Determinants of Health (SDOH) Interventions    Readmission Risk Interventions Readmission Risk Prevention Plan 05/14/2021  Transportation Screening Complete  PCP or Specialist Appt within 3-5 Days Not Complete  Not Complete comments Patient is from [facility] or [place] Complete  Social Work Consult for [place] Planning/Counseling Complete  Palliative Care Screening Not Applicable  Medication Review (RN Care Manager) Referral to Pharmacy  Some recent data might be hidden

## 2021-05-22 NOTE — Progress Notes (Signed)
Patient ID: Sheila Morgan, female   DOB: 1932-10-27, 88 y.o.   MRN: [id number]    Progress Note from the Palliative Medicine Team at [facility]   Patient Name: Sheila Morgan        Date: 05/22/2021 DOB: 06-15-1932  Age: 88 y.o. MRN#: [id number] Attending Physician: Annita Brod, MD Primary Care Physician: Binnie Rail, MD Admit Date: 05/09/2021   Medical records reviewed   88 y.o. female  with past medical history of  diastolic heart failure, HTN, T2DM, CAD sp CABG and dementia  admitted on 05/09/2021 with lethargy.   Recent hospitalizations for COVID on 04/05/21 and heart failure 04/18/21.   Hospitalization complicated by severe bradycardia, Hypotension and worsening mentation.  Several days in the ICU on dobutamine and norepinephrine.  Episode of melena and edema, EGD significant for mild gastritis and no active bleeding.  Patient with ongoing lethargy and poor PO intake. PMT consulted to discuss [name].   Of note, patient has documented healthcare power of attorney, and documentation of desire for natural death.  Hard copies on chart.  This NP visited patient at the bedside as a follow up to  [place], and for palliative medicine needs and emotional support and to meet with daughter Sheila Morgan as scheduled.      Continued conversation regarding current medical situation..  Today patient is minimally responsive.   She is  dyspneic at times.   This is a difficult time for daughter as she  anticipates  the loss of  her mother.   Education offered on the natural trajectory and expectations at end-of-life and that likely Sheila Morgan prognosis is hours to days   Although it is difficult Sheila Morgan is able to verbalize an understanding of her mother's limited prognosis and her ultimate hope is for comfort and dignity for her mother at end-of-life.  Education offered on the difference between aggressive medical interventions at the palliative comfort path for this patient at this  time in this situation  Plan of care    Comfort and dignity are the focus  -DNR/DNI--- allow for natural death -No artificial feeding or hydration now or in the future -Minimize medications to those that promote comfort -Symptoms management specific to dyspnea, pain and agitation   - Hopeful  for residential  hospice for EOL care-- will place referral    Education offered on hospice benefit; philosophy and eligibility both at home and in facility.  Questions and concerns addressed   Discussed with  bedside RN    Wadie Lessen NP  Palliative Medicine Team Team Phone # [phone] Pager [phone]

## 2021-05-22 NOTE — Plan of Care (Signed)
°  Problem: Clinical Measurements: Goal: Will remain free from infection Outcome: Progressing Goal: Respiratory complications will improve Outcome: Progressing Goal: Cardiovascular complication will be avoided Outcome: Progressing   Problem: Education: Goal: Knowledge of General Education information will improve Description: Including pain rating scale, medication(s)/side effects and non-pharmacologic comfort measures Outcome: Not Progressing   Problem: Health Behavior/Discharge Planning: Goal: Ability to manage health-related needs will improve Outcome: Not Progressing   Problem: Clinical Measurements: Goal: Ability to maintain clinical measurements within normal limits will improve Outcome: Not Progressing Goal: Diagnostic test results will improve Outcome: Not Progressing   Problem: Activity: Goal: Risk for activity intolerance will decrease Outcome: Not Progressing   Problem: Nutrition: Goal: Adequate nutrition will be maintained Outcome: Not Progressing   Problem: Coping: Goal: Level of anxiety will decrease Outcome: Not Progressing   Problem: Elimination: Goal: Will not experience complications related to bowel motility Outcome: Not Progressing Goal: Will not experience complications related to urinary retention Outcome: Not Progressing   Problem: Pain Managment: Goal: General experience of comfort will improve Outcome: Not Progressing   Problem: Safety: Goal: Ability to remain free from injury will improve Outcome: Not Progressing   Problem: Skin Integrity: Goal: Risk for impaired skin integrity will decrease Outcome: Not Progressing

## 2021-05-22 NOTE — Progress Notes (Signed)
[facility] Progress Note  Patient: Sheila Morgan    KZS:[id number]  DOA: 05/09/2021    Date of Service: the patient was seen and examined on 05/22/2021  Brief hospital course: 88 year old female past medical history diastolic heart failure, type 2 diabetes mellitus, CAD status post CABG and mild dementia with recent hospitalization for COVID in early December followed by heart failure in mid December sent over from her skilled nursing facility on 05/09/2021 for bradycardia, hypotension and worsening mentation.  After several days, patient began to worsen requiring pressor support was able to be weaned off the following day.  Hospital course noteworthy for some acute blood loss anemia and transfusion of 1 unit packed red blood cells on 1/9.  EGD revealed mild gastritis with no active bleeding.  Patient continued to decline on 01/15 went into atrial fibrillation with rapid ventricular rate and patient's mentation worsened.  After discussion with attending physician as well as palliative care, patient was transitioned to full comfort care formally on 1/18.  Assessment and Plan: Cardiovascular and Mediastinum A-fib [facility]) Assessment & Plan Requiring Cardizem drip.  Now that she is comfort care, Cardizem discontinued  DM (diabetes mellitus) type II, controlled, with peripheral vascular disorder [facility]) Assessment & Plan Patient now comfort care.  Sliding scale and CBG checks discontinued  Coronary artery disease Assessment & Plan Status post CABG.  Essential hypertension Assessment & Plan Initially hypotensive requiring pressor support.  Pressors weaned off.  Now comfort care.  All medications stopped.  * Acute on chronic diastolic CHF (congestive heart failure) ([place]) Assessment & Plan Despite diuresis, patient has declined.  Now comfort care  Other Malnutrition of moderate degree Assessment & Plan Nutrition Status: Nutrition Problem: Moderate Malnutrition Etiology: chronic illness  (dementia, CHF, CKD) Signs/Symptoms: mild fat depletion, moderate muscle depletion Interventions: Ensure Enlive (each supplement provides 350kcal and 20 grams of protein), MVI, Other (Comment), Refer to RD note for recommendations (feeding assistance)   Now comfort care    Body mass index is 29.07 kg/m.  Nutrition Problem: Moderate Malnutrition Etiology: chronic illness (dementia, CHF, CKD) Pressure Injury 04/19/21 Sacrum Lower;Posterior Unstageable - Full thickness tissue loss in which the base of the injury is covered by slough (yellow, tan, gray, green or brown) and/or eschar (tan, brown or black) in the wound bed. wound consult placed (Active)  04/19/21 1755  Location: Sacrum  Location Orientation: Lower;Posterior  Staging: Unstageable - Full thickness tissue loss in which the base of the injury is covered by slough (yellow, tan, gray, green or brown) and/or eschar (tan, brown or black) in the wound bed.  Wound Description (Comments): wound consult placed  Present on Admission: Yes    Unstageable sacral ulcer: Present on admission  Senile dementia with behavioral disturbance  Consultants: Cardiology Critical care Gastroenterology Palliative care  Procedures: Infusion of pressors EGD Transfusion packed red blood cells  Antimicrobials: None  Code Status: Comfort care   Subjective: Patient resting comfortably.    Objective: Vitals reviewed, rapid A. fib Vitals:   05/21/21 2005 05/22/21 0718  BP: (!) 118/97 138/85  Pulse: (!) 118   Resp: 18   Temp: (!) 96.9 F (36.1 C) (!) 96.4 F (35.8 C)  SpO2: 99%     Intake/Output Summary (Last 24 hours) at 05/22/2021 1333 Last data filed at 05/22/2021 1300 Gross per 24 hour  Intake 1230 ml  Output 500 ml  Net 730 ml    Filed Weights   05/18/21 0331 05/19/21 0300 05/20/21 0259  Weight: 81.9 kg 81.9  kg 81.7 kg   Body mass index is 29.07 kg/m.  Exam:  General: Patient appears comfortable. Cardiovascular:  Irregular rhythm, mildly tachycardic Respiratory: Scattered rhonchi  Data Reviewed: All labs discontinued now that she is comfort care  Disposition:  Status is: Inpatient  Remains inpatient appropriate because: Patient is actively dying   Family Communication: Left message for daughter on 1/18 DVT Prophylaxis:   None due to comfort care    Author: Annita Brod ,MD 05/22/2021 1:33 PM  To reach On-call, see care teams to locate the attending and reach out via [email or web address]. Between 7PM-7AM, please contact night-coverage If you still have difficulty reaching the attending provider, please page the [facility] (Director on Call) for [facility] on amion for assistance.

## 2021-05-23 DIAGNOSIS — L89150 Pressure ulcer of sacral region, unstageable: Secondary | ICD-10-CM

## 2021-05-23 NOTE — Progress Notes (Signed)
[facility] Progress Note  Patient: Sheila Morgan    YBO:[id number]  DOA: 05/09/2021    Date of Service: the patient was seen and examined on 05/23/2021  Brief hospital course: 88 year old female past medical history diastolic heart failure, type 2 diabetes mellitus, CAD status post CABG and mild dementia with recent hospitalization for COVID in early December followed by heart failure in mid December sent over from her skilled nursing facility on 05/09/2021 for bradycardia, hypotension and worsening mentation.  After several days, patient began to worsen requiring pressor support was able to be weaned off the following day.  Hospital course noteworthy for some acute blood loss anemia and transfusion of 1 unit packed red blood cells on 1/9.  EGD revealed mild gastritis with no active bleeding.  Patient continued to decline on 01/15 went into atrial fibrillation with rapid ventricular rate and patient's mentation worsened.  After discussion with attending physician as well as palliative care, patient was transitioned to full comfort care formally on 1/18.  Assessment and Plan: Cardiovascular and Mediastinum A-fib [facility]) Assessment & Plan Now that she is comfort care, Cardizem discontinued  DM (diabetes mellitus) type II, controlled, with peripheral vascular disorder [facility]) Assessment & Plan Patient now comfort care.  Sliding scale and CBG checks discontinued  Coronary artery disease Assessment & Plan Status post CABG.  Essential hypertension Assessment & Plan Initially hypotensive requiring pressor support.  Pressors weaned off.  Now comfort care.  All medications stopped.  * Acute on chronic diastolic CHF (congestive heart failure) ([place]) Assessment & Plan Despite diuresis, patient has declined.  Now comfort care  Other Malnutrition of moderate degree Assessment & Plan Nutrition Status: Nutrition Problem: Moderate Malnutrition Etiology: chronic illness (dementia, CHF,  CKD) Signs/Symptoms: mild fat depletion, moderate muscle depletion Interventions: Ensure Enlive (each supplement provides 350kcal and 20 grams of protein), MVI, Other (Comment), Refer to RD note for recommendations (feeding assistance)   Body mass index is 29.07 kg/m.  Nutrition Problem: Moderate Malnutrition Etiology: chronic illness (dementia, CHF, CKD) Pressure Injury 04/19/21 Sacrum Lower;Posterior Unstageable - Full thickness tissue loss in which the base of the injury is covered by slough (yellow, tan, gray, green or brown) and/or eschar (tan, brown or black) in the wound bed. wound consult placed (Active)  04/19/21 1755  Location: Sacrum  Location Orientation: Lower;Posterior  Staging: Unstageable - Full thickness tissue loss in which the base of the injury is covered by slough (yellow, tan, gray, green or brown) and/or eschar (tan, brown or black) in the wound bed.  Wound Description (Comments): wound consult placed  Present on Admission: Yes    Unstageable sacral ulcer: Present on admission  Senile dementia with behavioral disturbance  Consultants: Cardiology Critical care Gastroenterology Palliative care  Procedures: Infusion of pressors EGD Transfusion packed red blood cells  Antimicrobials: None  Code Status: Comfort care   Subjective: Patient unresponsive, but noted to be somewhat dyspneic. Instructed RN to give morphine as needed.   Objective: Vitals reviewed, rapid A. fib Vitals:   05/22/21 0718 05/23/21 0505  BP: 138/85 (!) 142/129  Pulse:  (!) 118  Resp:  20  Temp: (!) 96.4 F (35.8 C) (!) 97.5 F (36.4 C)  SpO2:      Intake/Output Summary (Last 24 hours) at 05/23/2021 1625 Last data filed at 05/23/2021 1448 Gross per 24 hour  Intake 0 ml  Output 150 ml  Net -150 ml   Filed Weights   05/18/21 0331 05/19/21 0300 05/20/21 0259  Weight: 81.9 kg 81.9  kg 81.7 kg   Body mass index is 29.07 kg/m.  Exam: General: NAD, non-responsive,  intermittently dysneic Cardiovascular: S1, S2 present Respiratory: Noted rhonchi Abdomen: Soft, nontender, nondistended, bowel sounds present Musculoskeletal: No bilateral pedal edema noted Skin: Normal Psychiatry: Unable to assess   Data Reviewed: All labs discontinued now that she is comfort care  Disposition:  Status is: Inpatient  Remains inpatient appropriate because: Patient is actively dying   Family Communication: None at bedside DVT Prophylaxis:   None due to comfort care    Author: Alma Friendly ,MD 05/23/2021 4:25 PM  To reach On-call, see care teams to locate the attending and reach out via [email or web address]. Between 7PM-7AM, please contact night-coverage

## 2021-05-23 NOTE — Progress Notes (Signed)
1/20 Pt at end of life IM Letter respectfully mailed to pt's home address.

## 2021-05-23 NOTE — TOC Progression Note (Addendum)
Transition of Care [facility]) - Progression Note    Patient Details  Name: PANG ROBERS MRN: [id number] Date of Birth: [birth year]  [facility]) CM/SW Contact  Jacalyn Lefevre Edson Snowball, RN Phone Number: 05/23/2021, 10:36 AM  Clinical Narrative:     Received referral for residential hospice , family prefers [place].   Called [place] cell [phone], and confirmed above.   Referral given to [place] with [facility] for [facility]. Abundio Miu will review referral.   [facility] can accept patient tomorrow will need DNR form signed . MD aware   Expected Discharge Plan: [place] Barriers to Discharge: Continued Medical Work up  Expected Discharge Plan and Services Expected Discharge Plan: [place] In-house Referral: Clinical Social Work     Living arrangements for the past 2 months: [place] (from [facility])                                       Social Determinants of Health (SDOH) Interventions    Readmission Risk Interventions Readmission Risk Prevention Plan 05/14/2021  Transportation Screening Complete  PCP or Specialist Appt within 3-5 Days Not Complete  Not Complete comments Patient is from [facility] or [place] Complete  Social Work Consult for [place] Planning/Counseling Complete  Palliative Care Screening Not Applicable  Medication Review (RN Care Manager) Referral to Pharmacy  Some recent data might be hidden

## 2021-05-23 NOTE — Care Management Important Message (Signed)
Important Message  Patient Details  Name: Sheila Morgan MRN: [id number] Date of Birth: [birth year]   Medicare Important Message Given:  Other (see comment)     Hannah Beat 05/23/2021, 3:10 PM

## 2021-05-23 NOTE — Progress Notes (Addendum)
Manufacturing engineer [facility]) Hospital Liaison Note   Referral received for interest in [facility] Chart under review by [facility] physician. [place] liaison will continue to follow. Please call with any questions or concerns.   Eligibility confirmed.  [place] is unable to offer a room today. Hospital Liaison will follow up tomorrow or sooner if a room becomes available. Please do not hesitate to call with questions.    Thank you for the opportunity to participate in this patient's care.    Buck Mam [facility] Liaison [phone]

## 2021-05-23 NOTE — Care Management Important Message (Signed)
Important Message  Patient Details  Name: Sheila Morgan MRN: [id number] Date of Birth: 1932-07-21   Medicare Important Message Given:  Yes     Hannah Beat 05/23/2021, 1:47 PM

## 2021-05-24 NOTE — TOC Transition Note (Signed)
Transition of Care [facility]) - CM/SW Discharge Note   Patient Details  Name: Sheila Morgan MRN: [id number] Date of Birth: Jan 02, 1933  Transition of Care [facility]) CM/SW Contact:  Emeterio Reeve, LCSW Phone Number: 05/24/2021, 2:09 PM   Clinical Narrative:     Per MD patient ready for DC to [facility]. RN, patient, patient's family, and facility notified of DC. Ambulance transport requested for patient.    RN to call report to [phone].  CSW will sign off for now as social work intervention is no longer needed. Please consult [place] again if new needs arise.   Final next level of care: [place] Barriers to Discharge: Barriers Resolved   Patient Goals and CMS Choice Patient states their goals for this hospitalization and ongoing recovery are:: SNF CMS Medicare.gov Compare Post Acute Care list provided to:: Patient Represenative (must comment) (Patients daughter [name]) Choice offered to / list presented to : Adult Children (Patients daughter [name])  Discharge Placement              Patient chooses bed at: Other - please specify in the comment section below: [facility] place) Patient to be transferred to facility by: [place] Name of family member notified: family Patient and family notified of of transfer: 05/24/21  Discharge Plan and Services In-house Referral: Clinical Social Work                                   Social Determinants of Health (SDOH) Interventions     Readmission Risk Interventions Readmission Risk Prevention Plan 05/14/2021  Transportation Screening Complete  PCP or Specialist Appt within 3-5 Days Not Complete  Not Complete comments Patient is from [facility] or [place] Complete  Social Work Consult for [place] Planning/Counseling Complete  Palliative Care Screening Not Applicable  Medication Review (RN Care Manager) Referral to Pharmacy  Some recent data might be hidden

## 2021-05-24 NOTE — Discharge Summary (Signed)
Discharge Summary  Sheila Morgan JKK:[id number] DOB: Jun 12, 1932  PCP: Binnie Rail, MD  Admit date: 05/09/2021 Discharge date: 05/24/2021  Time spent: 40 mins  Recommendations for Outpatient Follow-up:  Residential hospice- [facility]  Discharge Diagnoses:  Active Hospital Problems   Diagnosis Date Noted   Acute on chronic diastolic CHF (congestive heart failure) ([place]) 05/09/2021   Senile dementia with behavioral disturbance 05/21/2021   Unstageable pressure ulcer of sacral region ([place]) 05/21/2021   Malnutrition of moderate degree 05/14/2021   Bradycardia 05/11/2021   Hypotension 05/11/2021   Hypoalbuminemia 05/09/2021   A-fib ([place]) 12/31/2020   DM (diabetes mellitus) type II, controlled, with peripheral vascular disorder ([place]) 03/06/2014   Coronary artery disease    Essential hypertension 12/31/2009    Resolved Hospital Problems  No resolved problems to display.    Discharge Condition: Stable  Diet recommendation: None  Vitals:   05/23/21 0505 05/24/21 0500  BP: (!) 142/129 (!) 149/106  Pulse: (!) 118 69  Resp: 20 19  Temp: (!) 97.5 F (36.4 C) 98.5 F (36.9 C)  SpO2:  (!) 85%    History of present illness:  88 year old female past medical history diastolic heart failure, type 2 diabetes mellitus, CAD status post CABG and mild dementia with recent hospitalization for COVID in early December followed by heart failure in mid December sent over from her skilled nursing facility on 05/09/2021 for bradycardia, hypotension and worsening mentation.  After several days, patient began to worsen requiring pressor support was able to be weaned off the following day.  Hospital course noteworthy for some acute blood loss anemia and transfusion of 1 unit packed red blood cells on 1/9.  EGD revealed mild gastritis with no active bleeding.  Patient continued to decline on 01/15 went into atrial fibrillation with rapid ventricular rate and patient's mentation worsened.  After  discussion with attending physician as well as palliative care, patient was transitioned to full comfort care formally on 1/18.    Today, patient remains unresponsive.    Hospital Course:  Principal Problem:   Acute on chronic diastolic CHF (congestive heart failure) (HCC) Active Problems:   Essential hypertension   Coronary artery disease   DM (diabetes mellitus) type II, controlled, with peripheral vascular disorder (HCC)   A-fib (HCC)   Hypoalbuminemia   Bradycardia   Hypotension   Malnutrition of moderate degree   Senile dementia with behavioral disturbance   Unstageable pressure ulcer of sacral region ([place])   A-fib ([place]) Assessment & Plan Comfort care, Cardizem discontinued   DM (diabetes mellitus) type II, controlled, with peripheral vascular disorder [facility]) Assessment & Plan Patient now comfort care.  Sliding scale and CBG checks discontinued   Coronary artery disease Assessment & Plan Status post CABG.   Essential hypertension Assessment & Plan Initially hypotensive requiring pressor support.  Pressors weaned off.  Now comfort care.  All medications stopped.   * Acute on chronic diastolic CHF (congestive heart failure) ([place]) Assessment & Plan Despite diuresis, patient has declined.  Now comfort care    Malnutrition Type:  Nutrition Problem: Moderate Malnutrition Etiology: chronic illness (dementia, CHF, CKD)   Malnutrition Characteristics:  Signs/Symptoms: mild fat depletion, moderate muscle depletion   Nutrition Interventions:  Interventions: Ensure Enlive (each supplement provides 350kcal and 20 grams of protein), MVI, Other (Comment), Refer to RD note for recommendations (feeding assistance)   Estimated body mass index is 29.07 kg/m as calculated from the following:   Height as of this encounter: 5\' 6"  (1.676 m).  Weight as of this encounter: 81.7 kg.    Procedures: Vasopressors EGD   Consultations: Cardiology Critical  care Gastroenterology Palliative care   Discharge Exam: BP (!) 149/106 (BP Location: Right Arm)    Pulse 69    Temp 98.5 F (36.9 C) (Oral)    Resp 19    Ht 5\' 6"  (1.676 m)    Wt 81.7 kg    SpO2 (!) 85%    BMI 29.07 kg/m   General: NAD, unresponsive  Cardiovascular: S1, S2 present Respiratory: CTAB Abdomen: Soft, nontender, nondistended, bowel sounds present Musculoskeletal: No bilateral pedal edema noted Skin: Normal Psychiatry: Unable to assess   Discharge Instructions You were cared for by a hospitalist during your hospital stay. If you have any questions about your discharge medications or the care you received while you were in the hospital after you are discharged, you can call the unit and asked to speak with the hospitalist on call if the hospitalist that took care of you is not available. Once you are discharged, your primary care physician will handle any further medical issues. Please note that NO REFILLS for any discharge medications will be authorized once you are discharged, as it is imperative that you return to your primary care physician (or establish a relationship with a primary care physician if you do not have one) for your aftercare needs so that they can reassess your need for medications and monitor your lab values.   Allergies as of 05/24/2021   No Known Allergies      Medication List     STOP taking these medications    acetaminophen 500 MG tablet Commonly known as: TYLENOL   alendronate 70 MG tablet Commonly known as: FOSAMAX   allopurinol 100 MG tablet Commonly known as: ZYLOPRIM   Calcium Carbonate-Vitamin D 600-5 MG-MCG Tabs   carvedilol 25 MG tablet Commonly known as: COREG   diltiazem 120 MG tablet Commonly known as: CARDIZEM   docusate sodium 100 MG capsule Commonly known as: COLACE   ferrous gluconate 324 MG tablet Commonly known as: FERGON   furosemide 20 MG tablet Commonly known as: LASIX   insulin lispro 100 UNIT/ML  KwikPen Commonly known as: HumaLOG KwikPen   levothyroxine 50 MCG tablet Commonly known as: SYNTHROID   metFORMIN 500 MG tablet Commonly known as: GLUCOPHAGE   MULTIPLE VITAMIN PO   nystatin powder Commonly known as: MYCOSTATIN/NYSTOP   omeprazole 20 MG capsule Commonly known as: PRILOSEC   OneTouch Delica Plus [id number] Misc   OneTouch Ultra test strip Generic drug: glucose blood   Rivaroxaban 15 MG Tabs tablet Commonly known as: XARELTO   simvastatin 40 MG tablet Commonly known as: ZOCOR   spironolactone 25 MG tablet Commonly known as: ALDACTONE       No Known Allergies    The results of significant diagnostics from this hospitalization (including imaging, microbiology, ancillary and laboratory) are listed below for reference.    Significant Diagnostic Studies: CT Head Wo Contrast  Result Date: 05/09/2021 CLINICAL DATA:  Mental status change, lethargy, bradycardia EXAM: CT HEAD WITHOUT CONTRAST TECHNIQUE: Contiguous axial images were obtained from the base of the skull through the vertex without intravenous contrast. COMPARISON:  04/19/2021 FINDINGS: Brain: Stable atrophy pattern and chronic white matter microvascular ischemic changes throughout both cerebral hemispheres. No acute intracranial hemorrhage, mass lesion, new infarction, midline shift, herniation, hydrocephalus, or extra-axial fluid collection. No focal mass effect or edema. Cisterns are patent. No cerebellar abnormality. Vascular: Intracranial atherosclerosis at the skull base.  No hyperdense vessel. Skull: Normal. Negative for fracture or focal lesion. Sinuses/Orbits: No acute finding. Other: None. IMPRESSION: 1. Stable atrophy and chronic white matter microvascular ischemic changes. 2. No interval change or acute process by noncontrast CT. Electronically Signed   By: Jerilynn Mages.  Shick M.D.   On: 05/09/2021 15:55   DG Chest Port 1 View  Result Date: 05/09/2021 CLINICAL DATA:  Shortness of breath. Additional  history provided by scanning technologist: Edema in bilateral lower extremities, bradycardia, lethargy. EXAM: PORTABLE CHEST 1 VIEW COMPARISON:  Prior chest radiographs 04/21/2021 and earlier. FINDINGS: Cardiac monitoring devices overlie the chest. Prior median sternotomy/CABG. Redemonstrated cardiomegaly. Aortic atherosclerosis. Blunting of the costophrenic angles and hazy opacity at the bilateral lung bases compatible with layering pleural effusions. Superimposed opacity at the left greater than right lung bases, which may reflect atelectasis and/or pneumonia. Mild linear atelectasis versus scarring also present within the right mid lung. No appreciable pneumothorax. No acute bony abnormality identified. IMPRESSION: Cardiomegaly. Bilateral layering pleural effusions. Superimposed opacity at the left greater than right lung bases, which may reflect atelectasis and/or pneumonia. Aortic Atherosclerosis (ICD10-I70.0). Electronically Signed   By: Kellie Simmering D.O.   On: 05/09/2021 14:24   ECHOCARDIOGRAM COMPLETE  Result Date: 05/12/2021    ECHOCARDIOGRAM REPORT   Patient Name:   Sheila Morgan Date of Exam: 05/12/2021 Medical Rec #:  [id number]       Height:       66.0 in Accession #:    [id number]      Weight:       163.8 lb Date of Birth:  1933-02-12       BSA:          1.837 m Patient Age:    65 years        BP:           110/66 mmHg Patient Gender: F               HR:           108 bpm. Exam Location:  Inpatient Procedure: 2D Echo, Color Doppler and Cardiac Doppler Indications:    K81.27 Acute systolic (congestive) heart failure  History:        Patient has prior history of Echocardiogram examinations, most                 recent 04/19/2021. CHF, Prior CABG; Risk Factors:Hypertension,                 Diabetes and Dyslipidemia.  Sonographer:    Raquel Sarna Senior RDCS Referring Phys: [id number] AVA SWAYZE IMPRESSIONS  1. Left ventricular ejection fraction, by estimation, is 55 to 60%. The left ventricle has normal function.  The left ventricle has no regional wall motion abnormalities. There is moderate left ventricular hypertrophy. Left ventricular diastolic parameters are indeterminate.  2. Right ventricular systolic function is moderately reduced. The right ventricular size is mildly enlarged. There is mildly elevated pulmonary artery systolic pressure. The estimated right ventricular systolic pressure is 01.7 mmHg.  3. Left atrial size was mildly dilated.  4. Right atrial size was mildly dilated.  5. The mitral valve is degenerative. Mild mitral valve regurgitation. No evidence of mitral stenosis. Moderate mitral annular calcification.  6. Tricuspid valve regurgitation is severe.  7. The aortic valve is tricuspid. Aortic valve regurgitation is not visualized. Aortic valve sclerosis/calcification is present, without any evidence of aortic stenosis.  8. The inferior vena cava is dilated in size with >50% respiratory variability, suggesting right  atrial pressure of 8 mmHg. FINDINGS  Left Ventricle: Left ventricular ejection fraction, by estimation, is 55 to 60%. The left ventricle has normal function. The left ventricle has no regional wall motion abnormalities. The left ventricular internal cavity size was normal in size. There is  moderate left ventricular hypertrophy. Left ventricular diastolic parameters are indeterminate. Right Ventricle: The right ventricular size is mildly enlarged. Right vetricular wall thickness was not well visualized. Right ventricular systolic function is moderately reduced. There is mildly elevated pulmonary artery systolic pressure. The tricuspid  regurgitant velocity is 2.66 m/s, and with an assumed right atrial pressure of 8 mmHg, the estimated right ventricular systolic pressure is 62.3 mmHg. Left Atrium: Left atrial size was mildly dilated. Right Atrium: Right atrial size was mildly dilated. Pericardium: There is no evidence of pericardial effusion. Mitral Valve: The mitral valve is degenerative in  appearance. Moderate mitral annular calcification. Mild mitral valve regurgitation. No evidence of mitral valve stenosis. Tricuspid Valve: The tricuspid valve is normal in structure. Tricuspid valve regurgitation is severe. Aortic Valve: The aortic valve is tricuspid. Aortic valve regurgitation is not visualized. Aortic valve sclerosis/calcification is present, without any evidence of aortic stenosis. Pulmonic Valve: The pulmonic valve was not well visualized. Pulmonic valve regurgitation is trivial. Aorta: The aortic root and ascending aorta are structurally normal, with no evidence of dilitation. Venous: The inferior vena cava is dilated in size with greater than 50% respiratory variability, suggesting right atrial pressure of 8 mmHg. IAS/Shunts: The interatrial septum was not well visualized.  LEFT VENTRICLE PLAX 2D LVIDd:         3.50 cm LVIDs:         2.80 cm LV PW:         1.30 cm LV IVS:        1.30 cm LVOT diam:     1.80 cm LV SV:         30 LV SV Index:   16 LVOT Area:     2.54 cm  RIGHT VENTRICLE RV S prime:     5.33 cm/s TAPSE (M-mode): 1.2 cm LEFT ATRIUM             Index        RIGHT ATRIUM           Index LA diam:        3.40 cm 1.85 cm/m   RA Area:     22.70 cm LA Vol (A2C):   63.4 ml 34.51 ml/m  RA Volume:   69.40 ml  37.78 ml/m LA Vol (A4C):   59.5 ml 32.39 ml/m LA Biplane Vol: 65.9 ml 35.87 ml/m  AORTIC VALVE LVOT Vmax:   59.10 cm/s LVOT Vmean:  43.300 cm/s LVOT VTI:    0.117 m  AORTA Ao Root diam: 2.70 cm Ao Asc diam:  3.30 cm TRICUSPID VALVE TR Peak grad:   28.3 mmHg TR Vmax:        266.00 cm/s  SHUNTS Systemic VTI:  0.12 m Systemic Diam: 1.80 cm Oswaldo Milian MD Electronically signed by Oswaldo Milian MD Signature Date/Time: 05/12/2021/5:23:25 PM    Final     Microbiology: No results found for this or any previous visit (from the past 240 hour(s)).   Labs: Basic Metabolic Panel: Recent Labs  Lab 05/18/21 0945 05/19/21 0126 05/20/21 0052  NA 131* 133* 137  K 5.6*  5.0 5.3*  CL 105 103 105  CO2 18* 20* 21*  GLUCOSE 183* 194* 164*  BUN 45* 50*  55*  CREATININE 2.56* 2.59* 2.61*  CALCIUM 8.4* 9.2 9.4   Liver Function Tests: No results for input(s): AST, ALT, ALKPHOS, BILITOT, PROT, ALBUMIN in the last 168 hours. No results for input(s): LIPASE, AMYLASE in the last 168 hours. No results for input(s): AMMONIA in the last 168 hours. CBC: No results for input(s): WBC, NEUTROABS, HGB, HCT, MCV, PLT in the last 168 hours. Cardiac Enzymes: No results for input(s): CKTOTAL, CKMB, CKMBINDEX, TROPONINI in the last 168 hours. BNP: BNP (last 3 results) Recent Labs    04/24/21 0505 05/09/21 1446 05/13/21 1136  BNP 1,219.3* 722.2* 444.5*    ProBNP (last 3 results) No results for input(s): PROBNP in the last 8760 hours.  CBG: Recent Labs  Lab 05/19/21 0611 05/19/21 1148 05/19/21 1610 05/19/21 2101 05/20/21 0602  GLUCAP 183* 195* 209* 145* 168*       Signed:  Alma Friendly, MD [facility] 05/24/2021, 4:24 PM

## 2021-05-24 NOTE — Progress Notes (Signed)
Engineer, maintenance [facility]) Hospital Liaison note.    Received request from [place] for family interest in [facility] with request for transfer today. Chart reviewed and eligibility confirmed.    Spoke to daughter [name] to confirm interest and explain services. Family agreeable to transfer today. CSW aware.  Registration paperwork will be completed by [name].  Dr. Orpah Melter assume care per family request.     RN please call report to [phone]. Please arrange transport for patient once consents have been signed.    Thank you,   Clementeen Hoof, BSN, RN [place] (listed on [facility] under [facility] [phone]   [phone]

## 2021-05-26 ENCOUNTER — Encounter: Payer: Self-pay | Admitting: Podiatry

## 2021-05-26 ENCOUNTER — Encounter: Payer: Self-pay | Admitting: Cardiovascular Disease

## 2021-05-26 ENCOUNTER — Encounter: Payer: Self-pay | Admitting: Internal Medicine

## 2021-05-26 ENCOUNTER — Other Ambulatory Visit: Payer: Self-pay | Admitting: Internal Medicine

## 2021-06-04 ENCOUNTER — Ambulatory Visit: Payer: Medicare Other | Admitting: Physician Assistant

## 2021-06-04 DEATH — deceased

## 2021-06-11 ENCOUNTER — Ambulatory Visit: Payer: Medicare Other | Admitting: Podiatry

## 2021-07-14 ENCOUNTER — Ambulatory Visit: Payer: Medicare Other | Admitting: Internal Medicine

## 2021-08-19 ENCOUNTER — Telehealth: Payer: Medicare Other
